# Patient Record
Sex: Male | Born: 1935 | Race: White | Hispanic: No | Marital: Married | Smoking: Never smoker
Health system: Southern US, Community
[De-identification: ages and names within clinical notes are randomized; demographics above are authoritative.]

## PROBLEM LIST (undated history)

## (undated) DIAGNOSIS — Z87442 Personal history of urinary calculi: Secondary | ICD-10-CM

## (undated) DIAGNOSIS — N179 Acute kidney failure, unspecified: Secondary | ICD-10-CM

## (undated) DIAGNOSIS — K219 Gastro-esophageal reflux disease without esophagitis: Secondary | ICD-10-CM

## (undated) DIAGNOSIS — F411 Generalized anxiety disorder: Secondary | ICD-10-CM

## (undated) DIAGNOSIS — N390 Urinary tract infection, site not specified: Secondary | ICD-10-CM

## (undated) DIAGNOSIS — N200 Calculus of kidney: Secondary | ICD-10-CM

## (undated) DIAGNOSIS — E119 Type 2 diabetes mellitus without complications: Secondary | ICD-10-CM

## (undated) DIAGNOSIS — I2510 Atherosclerotic heart disease of native coronary artery without angina pectoris: Secondary | ICD-10-CM

## (undated) DIAGNOSIS — I10 Essential (primary) hypertension: Secondary | ICD-10-CM

## (undated) DIAGNOSIS — R0902 Hypoxemia: Secondary | ICD-10-CM

## (undated) DIAGNOSIS — E785 Hyperlipidemia, unspecified: Secondary | ICD-10-CM

## (undated) DIAGNOSIS — R9389 Abnormal findings on diagnostic imaging of other specified body structures: Secondary | ICD-10-CM

## (undated) DIAGNOSIS — J189 Pneumonia, unspecified organism: Secondary | ICD-10-CM

## (undated) DIAGNOSIS — M3320 Polymyositis, organ involvement unspecified: Secondary | ICD-10-CM

## (undated) DIAGNOSIS — C4490 Unspecified malignant neoplasm of skin, unspecified: Secondary | ICD-10-CM

## (undated) DIAGNOSIS — R509 Fever, unspecified: Secondary | ICD-10-CM

## (undated) DIAGNOSIS — D649 Anemia, unspecified: Secondary | ICD-10-CM

## (undated) DIAGNOSIS — K5190 Ulcerative colitis, unspecified, without complications: Secondary | ICD-10-CM

## (undated) DIAGNOSIS — R7401 Elevation of levels of liver transaminase levels: Secondary | ICD-10-CM

## (undated) DIAGNOSIS — F5101 Primary insomnia: Secondary | ICD-10-CM

## (undated) DIAGNOSIS — J438 Other emphysema: Secondary | ICD-10-CM

## (undated) DIAGNOSIS — M1990 Unspecified osteoarthritis, unspecified site: Secondary | ICD-10-CM

## (undated) DIAGNOSIS — R001 Bradycardia, unspecified: Secondary | ICD-10-CM

## (undated) DIAGNOSIS — R600 Localized edema: Secondary | ICD-10-CM

## (undated) DIAGNOSIS — R059 Cough, unspecified: Secondary | ICD-10-CM

## (undated) HISTORY — DX: Atherosclerotic heart disease of native coronary artery without angina pectoris: I25.10

## (undated) HISTORY — DX: Anemia, unspecified: D64.9

## (undated) HISTORY — DX: Urinary tract infection, site not specified: N39.0

## (undated) HISTORY — PX: BACK SURGERY: SHX140

## (undated) HISTORY — DX: Generalized anxiety disorder: F41.1

## (undated) HISTORY — DX: Calculus of kidney: N20.0

## (undated) HISTORY — DX: Hyperlipidemia, unspecified: E78.5

## (undated) HISTORY — PX: CATARACT EXTRACTION: SUR2

## (undated) HISTORY — DX: Cough, unspecified: R05.9

## (undated) HISTORY — DX: Type 2 diabetes mellitus without complications: E11.9

## (undated) HISTORY — DX: Fever, unspecified: R50.9

## (undated) HISTORY — DX: Ulcerative colitis, unspecified, without complications: K51.90

## (undated) HISTORY — DX: Pneumonia, unspecified organism: J18.9

## (undated) HISTORY — PX: TRIGGER FINGER RELEASE: SHX641

## (undated) HISTORY — DX: Abnormal findings on diagnostic imaging of other specified body structures: R93.89

## (undated) HISTORY — DX: Elevation of levels of liver transaminase levels: R74.01

## (undated) HISTORY — DX: Polymyositis, organ involvement unspecified: M33.20

## (undated) HISTORY — DX: Unspecified osteoarthritis, unspecified site: M19.90

## (undated) HISTORY — DX: Essential (primary) hypertension: I10

## (undated) HISTORY — DX: Unspecified malignant neoplasm of skin, unspecified: C44.90

## (undated) HISTORY — PX: ANGIOPLASTY: SHX39

## (undated) HISTORY — DX: Primary insomnia: F51.01

## (undated) HISTORY — DX: Acute kidney failure, unspecified: N17.9

## (undated) HISTORY — DX: Hypoxemia: R09.02

## (undated) HISTORY — DX: Bradycardia, unspecified: R00.1

## (undated) HISTORY — DX: Localized edema: R60.0

## (undated) HISTORY — DX: Other emphysema: J43.8

## (undated) HISTORY — DX: Gastro-esophageal reflux disease without esophagitis: K21.9

---

## 2002-07-25 ENCOUNTER — Encounter: Admission: RE | Admit: 2002-07-25 | Discharge: 2002-07-25 | Payer: Self-pay | Admitting: Unknown Physician Specialty

## 2002-07-25 ENCOUNTER — Encounter: Payer: Self-pay | Admitting: Unknown Physician Specialty

## 2003-04-26 HISTORY — PX: ESOPHAGOGASTRODUODENOSCOPY: SHX1529

## 2003-05-02 ENCOUNTER — Encounter: Admission: RE | Admit: 2003-05-02 | Discharge: 2003-05-02 | Payer: Self-pay | Admitting: Neurosurgery

## 2003-05-02 ENCOUNTER — Encounter: Payer: Self-pay | Admitting: Neurosurgery

## 2003-05-02 ENCOUNTER — Encounter: Payer: Self-pay | Admitting: Radiology

## 2003-05-16 ENCOUNTER — Encounter: Admission: RE | Admit: 2003-05-16 | Discharge: 2003-05-16 | Payer: Self-pay | Admitting: Neurosurgery

## 2003-05-16 ENCOUNTER — Encounter: Payer: Self-pay | Admitting: Neurosurgery

## 2003-07-07 ENCOUNTER — Ambulatory Visit (HOSPITAL_COMMUNITY): Admission: RE | Admit: 2003-07-07 | Discharge: 2003-07-08 | Payer: Self-pay | Admitting: Neurosurgery

## 2005-06-16 HISTORY — PX: COLONOSCOPY: SHX174

## 2008-08-25 HISTORY — PX: ANGIOPLASTY: SHX39

## 2014-11-06 DIAGNOSIS — E784 Other hyperlipidemia: Secondary | ICD-10-CM | POA: Diagnosis not present

## 2014-11-06 DIAGNOSIS — I10 Essential (primary) hypertension: Secondary | ICD-10-CM | POA: Diagnosis not present

## 2014-11-08 DIAGNOSIS — I2510 Atherosclerotic heart disease of native coronary artery without angina pectoris: Secondary | ICD-10-CM | POA: Diagnosis not present

## 2014-11-09 DIAGNOSIS — H524 Presbyopia: Secondary | ICD-10-CM | POA: Diagnosis not present

## 2014-11-09 DIAGNOSIS — H5210 Myopia, unspecified eye: Secondary | ICD-10-CM | POA: Diagnosis not present

## 2015-03-05 DIAGNOSIS — M65351 Trigger finger, right little finger: Secondary | ICD-10-CM | POA: Diagnosis not present

## 2015-03-26 DIAGNOSIS — I2510 Atherosclerotic heart disease of native coronary artery without angina pectoris: Secondary | ICD-10-CM | POA: Diagnosis not present

## 2015-03-26 DIAGNOSIS — R7301 Impaired fasting glucose: Secondary | ICD-10-CM | POA: Diagnosis not present

## 2015-03-26 DIAGNOSIS — L299 Pruritus, unspecified: Secondary | ICD-10-CM | POA: Diagnosis not present

## 2015-03-26 DIAGNOSIS — F411 Generalized anxiety disorder: Secondary | ICD-10-CM | POA: Diagnosis not present

## 2015-03-26 DIAGNOSIS — E782 Mixed hyperlipidemia: Secondary | ICD-10-CM | POA: Diagnosis not present

## 2015-03-26 DIAGNOSIS — E780 Pure hypercholesterolemia: Secondary | ICD-10-CM | POA: Diagnosis not present

## 2015-03-26 DIAGNOSIS — K219 Gastro-esophageal reflux disease without esophagitis: Secondary | ICD-10-CM | POA: Diagnosis not present

## 2015-04-27 DIAGNOSIS — F5101 Primary insomnia: Secondary | ICD-10-CM | POA: Diagnosis not present

## 2015-04-27 DIAGNOSIS — F411 Generalized anxiety disorder: Secondary | ICD-10-CM | POA: Diagnosis not present

## 2015-04-27 DIAGNOSIS — E119 Type 2 diabetes mellitus without complications: Secondary | ICD-10-CM | POA: Diagnosis not present

## 2015-06-27 DIAGNOSIS — E119 Type 2 diabetes mellitus without complications: Secondary | ICD-10-CM | POA: Diagnosis not present

## 2015-06-27 DIAGNOSIS — E7800 Pure hypercholesterolemia, unspecified: Secondary | ICD-10-CM | POA: Diagnosis not present

## 2015-06-29 DIAGNOSIS — F5101 Primary insomnia: Secondary | ICD-10-CM | POA: Diagnosis not present

## 2015-06-29 DIAGNOSIS — Z1211 Encounter for screening for malignant neoplasm of colon: Secondary | ICD-10-CM | POA: Diagnosis not present

## 2015-06-29 DIAGNOSIS — F341 Dysthymic disorder: Secondary | ICD-10-CM | POA: Diagnosis not present

## 2015-06-29 DIAGNOSIS — Z6829 Body mass index (BMI) 29.0-29.9, adult: Secondary | ICD-10-CM | POA: Diagnosis not present

## 2015-06-29 DIAGNOSIS — Z0000 Encounter for general adult medical examination without abnormal findings: Secondary | ICD-10-CM | POA: Diagnosis not present

## 2015-06-29 DIAGNOSIS — M65351 Trigger finger, right little finger: Secondary | ICD-10-CM | POA: Diagnosis not present

## 2015-06-29 DIAGNOSIS — E119 Type 2 diabetes mellitus without complications: Secondary | ICD-10-CM | POA: Diagnosis not present

## 2015-07-16 DIAGNOSIS — E119 Type 2 diabetes mellitus without complications: Secondary | ICD-10-CM | POA: Diagnosis not present

## 2015-07-26 DIAGNOSIS — E119 Type 2 diabetes mellitus without complications: Secondary | ICD-10-CM | POA: Diagnosis not present

## 2015-07-26 DIAGNOSIS — F5101 Primary insomnia: Secondary | ICD-10-CM | POA: Diagnosis not present

## 2015-07-26 DIAGNOSIS — F320 Major depressive disorder, single episode, mild: Secondary | ICD-10-CM | POA: Diagnosis not present

## 2015-08-01 DIAGNOSIS — M65351 Trigger finger, right little finger: Secondary | ICD-10-CM | POA: Diagnosis not present

## 2015-08-03 DIAGNOSIS — E119 Type 2 diabetes mellitus without complications: Secondary | ICD-10-CM | POA: Diagnosis not present

## 2015-08-07 DIAGNOSIS — R008 Other abnormalities of heart beat: Secondary | ICD-10-CM | POA: Diagnosis not present

## 2015-08-07 DIAGNOSIS — Z01818 Encounter for other preprocedural examination: Secondary | ICD-10-CM | POA: Diagnosis not present

## 2015-08-07 DIAGNOSIS — Z01810 Encounter for preprocedural cardiovascular examination: Secondary | ICD-10-CM | POA: Diagnosis not present

## 2015-08-07 DIAGNOSIS — R238 Other skin changes: Secondary | ICD-10-CM | POA: Diagnosis not present

## 2015-08-07 DIAGNOSIS — M65351 Trigger finger, right little finger: Secondary | ICD-10-CM | POA: Diagnosis not present

## 2015-08-30 DIAGNOSIS — E119 Type 2 diabetes mellitus without complications: Secondary | ICD-10-CM | POA: Diagnosis not present

## 2015-09-04 DIAGNOSIS — M65351 Trigger finger, right little finger: Secondary | ICD-10-CM | POA: Diagnosis not present

## 2015-09-06 DIAGNOSIS — E7800 Pure hypercholesterolemia, unspecified: Secondary | ICD-10-CM | POA: Diagnosis not present

## 2015-09-06 DIAGNOSIS — I2510 Atherosclerotic heart disease of native coronary artery without angina pectoris: Secondary | ICD-10-CM | POA: Diagnosis not present

## 2015-09-06 DIAGNOSIS — M1990 Unspecified osteoarthritis, unspecified site: Secondary | ICD-10-CM | POA: Diagnosis not present

## 2015-09-06 DIAGNOSIS — F329 Major depressive disorder, single episode, unspecified: Secondary | ICD-10-CM | POA: Diagnosis not present

## 2015-09-06 DIAGNOSIS — M65351 Trigger finger, right little finger: Secondary | ICD-10-CM | POA: Diagnosis not present

## 2015-09-06 DIAGNOSIS — I10 Essential (primary) hypertension: Secondary | ICD-10-CM | POA: Diagnosis not present

## 2015-09-06 DIAGNOSIS — K219 Gastro-esophageal reflux disease without esophagitis: Secondary | ICD-10-CM | POA: Diagnosis not present

## 2015-09-06 DIAGNOSIS — M109 Gout, unspecified: Secondary | ICD-10-CM | POA: Diagnosis not present

## 2015-09-06 DIAGNOSIS — F419 Anxiety disorder, unspecified: Secondary | ICD-10-CM | POA: Diagnosis not present

## 2015-09-10 DIAGNOSIS — M25641 Stiffness of right hand, not elsewhere classified: Secondary | ICD-10-CM | POA: Diagnosis not present

## 2015-09-10 DIAGNOSIS — M25541 Pain in joints of right hand: Secondary | ICD-10-CM | POA: Diagnosis not present

## 2015-10-19 DIAGNOSIS — F5101 Primary insomnia: Secondary | ICD-10-CM | POA: Diagnosis not present

## 2015-10-19 DIAGNOSIS — M79641 Pain in right hand: Secondary | ICD-10-CM | POA: Diagnosis not present

## 2015-10-19 DIAGNOSIS — I2510 Atherosclerotic heart disease of native coronary artery without angina pectoris: Secondary | ICD-10-CM | POA: Diagnosis not present

## 2015-10-19 DIAGNOSIS — N183 Chronic kidney disease, stage 3 (moderate): Secondary | ICD-10-CM | POA: Diagnosis not present

## 2015-10-19 DIAGNOSIS — E782 Mixed hyperlipidemia: Secondary | ICD-10-CM | POA: Diagnosis not present

## 2015-10-19 DIAGNOSIS — I10 Essential (primary) hypertension: Secondary | ICD-10-CM | POA: Diagnosis not present

## 2015-10-19 DIAGNOSIS — I119 Hypertensive heart disease without heart failure: Secondary | ICD-10-CM | POA: Diagnosis not present

## 2015-10-19 DIAGNOSIS — E119 Type 2 diabetes mellitus without complications: Secondary | ICD-10-CM | POA: Diagnosis not present

## 2015-10-30 DIAGNOSIS — M65351 Trigger finger, right little finger: Secondary | ICD-10-CM | POA: Diagnosis not present

## 2015-10-30 DIAGNOSIS — Z6829 Body mass index (BMI) 29.0-29.9, adult: Secondary | ICD-10-CM | POA: Diagnosis not present

## 2015-10-30 DIAGNOSIS — E119 Type 2 diabetes mellitus without complications: Secondary | ICD-10-CM | POA: Diagnosis not present

## 2015-10-30 DIAGNOSIS — Z1211 Encounter for screening for malignant neoplasm of colon: Secondary | ICD-10-CM | POA: Diagnosis not present

## 2015-10-30 DIAGNOSIS — F341 Dysthymic disorder: Secondary | ICD-10-CM | POA: Diagnosis not present

## 2015-10-30 DIAGNOSIS — F5101 Primary insomnia: Secondary | ICD-10-CM | POA: Diagnosis not present

## 2015-10-30 DIAGNOSIS — Z0000 Encounter for general adult medical examination without abnormal findings: Secondary | ICD-10-CM | POA: Diagnosis not present

## 2015-12-28 DIAGNOSIS — I2510 Atherosclerotic heart disease of native coronary artery without angina pectoris: Secondary | ICD-10-CM | POA: Diagnosis not present

## 2015-12-28 DIAGNOSIS — I10 Essential (primary) hypertension: Secondary | ICD-10-CM | POA: Diagnosis not present

## 2015-12-28 DIAGNOSIS — E088 Diabetes mellitus due to underlying condition with unspecified complications: Secondary | ICD-10-CM | POA: Diagnosis not present

## 2015-12-28 DIAGNOSIS — E785 Hyperlipidemia, unspecified: Secondary | ICD-10-CM | POA: Diagnosis not present

## 2016-01-18 DIAGNOSIS — I10 Essential (primary) hypertension: Secondary | ICD-10-CM | POA: Diagnosis not present

## 2016-01-18 DIAGNOSIS — I119 Hypertensive heart disease without heart failure: Secondary | ICD-10-CM | POA: Diagnosis not present

## 2016-01-18 DIAGNOSIS — C44329 Squamous cell carcinoma of skin of other parts of face: Secondary | ICD-10-CM | POA: Diagnosis not present

## 2016-01-18 DIAGNOSIS — M79641 Pain in right hand: Secondary | ICD-10-CM | POA: Diagnosis not present

## 2016-01-18 DIAGNOSIS — F5101 Primary insomnia: Secondary | ICD-10-CM | POA: Diagnosis not present

## 2016-01-18 DIAGNOSIS — N183 Chronic kidney disease, stage 3 (moderate): Secondary | ICD-10-CM | POA: Diagnosis not present

## 2016-01-18 DIAGNOSIS — E782 Mixed hyperlipidemia: Secondary | ICD-10-CM | POA: Diagnosis not present

## 2016-01-18 DIAGNOSIS — E119 Type 2 diabetes mellitus without complications: Secondary | ICD-10-CM | POA: Diagnosis not present

## 2016-01-18 DIAGNOSIS — I2510 Atherosclerotic heart disease of native coronary artery without angina pectoris: Secondary | ICD-10-CM | POA: Diagnosis not present

## 2016-02-01 DIAGNOSIS — N410 Acute prostatitis: Secondary | ICD-10-CM | POA: Diagnosis not present

## 2016-02-01 DIAGNOSIS — N3001 Acute cystitis with hematuria: Secondary | ICD-10-CM | POA: Diagnosis not present

## 2016-02-05 DIAGNOSIS — H52223 Regular astigmatism, bilateral: Secondary | ICD-10-CM | POA: Diagnosis not present

## 2016-02-05 DIAGNOSIS — H5210 Myopia, unspecified eye: Secondary | ICD-10-CM | POA: Diagnosis not present

## 2016-03-10 DIAGNOSIS — Z0000 Encounter for general adult medical examination without abnormal findings: Secondary | ICD-10-CM | POA: Diagnosis not present

## 2016-03-10 DIAGNOSIS — E663 Overweight: Secondary | ICD-10-CM | POA: Diagnosis not present

## 2016-03-10 DIAGNOSIS — Z6828 Body mass index (BMI) 28.0-28.9, adult: Secondary | ICD-10-CM | POA: Diagnosis not present

## 2016-03-14 DIAGNOSIS — Z1211 Encounter for screening for malignant neoplasm of colon: Secondary | ICD-10-CM | POA: Diagnosis not present

## 2016-06-03 DIAGNOSIS — I119 Hypertensive heart disease without heart failure: Secondary | ICD-10-CM | POA: Diagnosis not present

## 2016-06-03 DIAGNOSIS — L570 Actinic keratosis: Secondary | ICD-10-CM | POA: Diagnosis not present

## 2016-06-03 DIAGNOSIS — F5101 Primary insomnia: Secondary | ICD-10-CM | POA: Diagnosis not present

## 2016-06-30 DIAGNOSIS — I2510 Atherosclerotic heart disease of native coronary artery without angina pectoris: Secondary | ICD-10-CM | POA: Diagnosis not present

## 2016-06-30 DIAGNOSIS — E088 Diabetes mellitus due to underlying condition with unspecified complications: Secondary | ICD-10-CM | POA: Diagnosis not present

## 2016-06-30 DIAGNOSIS — I10 Essential (primary) hypertension: Secondary | ICD-10-CM | POA: Diagnosis not present

## 2016-06-30 DIAGNOSIS — E785 Hyperlipidemia, unspecified: Secondary | ICD-10-CM | POA: Diagnosis not present

## 2016-07-02 DIAGNOSIS — M545 Low back pain: Secondary | ICD-10-CM | POA: Diagnosis not present

## 2016-07-02 DIAGNOSIS — I119 Hypertensive heart disease without heart failure: Secondary | ICD-10-CM | POA: Diagnosis not present

## 2016-07-02 DIAGNOSIS — D485 Neoplasm of uncertain behavior of skin: Secondary | ICD-10-CM | POA: Diagnosis not present

## 2016-07-21 DIAGNOSIS — Z125 Encounter for screening for malignant neoplasm of prostate: Secondary | ICD-10-CM | POA: Diagnosis not present

## 2016-07-21 DIAGNOSIS — I2510 Atherosclerotic heart disease of native coronary artery without angina pectoris: Secondary | ICD-10-CM | POA: Diagnosis not present

## 2016-07-21 DIAGNOSIS — I119 Hypertensive heart disease without heart failure: Secondary | ICD-10-CM | POA: Diagnosis not present

## 2016-07-21 DIAGNOSIS — E119 Type 2 diabetes mellitus without complications: Secondary | ICD-10-CM | POA: Diagnosis not present

## 2016-07-21 DIAGNOSIS — N183 Chronic kidney disease, stage 3 (moderate): Secondary | ICD-10-CM | POA: Diagnosis not present

## 2016-07-21 DIAGNOSIS — E782 Mixed hyperlipidemia: Secondary | ICD-10-CM | POA: Diagnosis not present

## 2016-07-21 DIAGNOSIS — F5101 Primary insomnia: Secondary | ICD-10-CM | POA: Diagnosis not present

## 2016-12-23 DIAGNOSIS — I2510 Atherosclerotic heart disease of native coronary artery without angina pectoris: Secondary | ICD-10-CM | POA: Diagnosis not present

## 2016-12-23 DIAGNOSIS — E088 Diabetes mellitus due to underlying condition with unspecified complications: Secondary | ICD-10-CM | POA: Diagnosis not present

## 2016-12-23 DIAGNOSIS — E785 Hyperlipidemia, unspecified: Secondary | ICD-10-CM | POA: Diagnosis not present

## 2016-12-23 DIAGNOSIS — I10 Essential (primary) hypertension: Secondary | ICD-10-CM | POA: Diagnosis not present

## 2017-02-06 DIAGNOSIS — E119 Type 2 diabetes mellitus without complications: Secondary | ICD-10-CM | POA: Diagnosis not present

## 2017-02-06 DIAGNOSIS — D6489 Other specified anemias: Secondary | ICD-10-CM | POA: Diagnosis not present

## 2017-02-06 DIAGNOSIS — M65331 Trigger finger, right middle finger: Secondary | ICD-10-CM | POA: Diagnosis not present

## 2017-02-06 DIAGNOSIS — I119 Hypertensive heart disease without heart failure: Secondary | ICD-10-CM | POA: Diagnosis not present

## 2017-02-06 DIAGNOSIS — R05 Cough: Secondary | ICD-10-CM | POA: Diagnosis not present

## 2017-02-06 DIAGNOSIS — R918 Other nonspecific abnormal finding of lung field: Secondary | ICD-10-CM | POA: Diagnosis not present

## 2017-02-06 DIAGNOSIS — D509 Iron deficiency anemia, unspecified: Secondary | ICD-10-CM | POA: Diagnosis not present

## 2017-02-06 DIAGNOSIS — I10 Essential (primary) hypertension: Secondary | ICD-10-CM | POA: Diagnosis not present

## 2017-02-06 DIAGNOSIS — E782 Mixed hyperlipidemia: Secondary | ICD-10-CM | POA: Diagnosis not present

## 2017-02-06 DIAGNOSIS — Z6828 Body mass index (BMI) 28.0-28.9, adult: Secondary | ICD-10-CM | POA: Diagnosis not present

## 2017-02-23 DIAGNOSIS — Z1211 Encounter for screening for malignant neoplasm of colon: Secondary | ICD-10-CM | POA: Diagnosis not present

## 2017-02-24 DIAGNOSIS — H524 Presbyopia: Secondary | ICD-10-CM | POA: Diagnosis not present

## 2017-03-30 DIAGNOSIS — H353131 Nonexudative age-related macular degeneration, bilateral, early dry stage: Secondary | ICD-10-CM | POA: Diagnosis not present

## 2017-03-30 DIAGNOSIS — H25811 Combined forms of age-related cataract, right eye: Secondary | ICD-10-CM | POA: Diagnosis not present

## 2017-03-30 DIAGNOSIS — E119 Type 2 diabetes mellitus without complications: Secondary | ICD-10-CM | POA: Diagnosis not present

## 2017-03-30 DIAGNOSIS — H2511 Age-related nuclear cataract, right eye: Secondary | ICD-10-CM | POA: Diagnosis not present

## 2017-03-31 DIAGNOSIS — H25013 Cortical age-related cataract, bilateral: Secondary | ICD-10-CM | POA: Diagnosis not present

## 2017-04-06 DIAGNOSIS — H25812 Combined forms of age-related cataract, left eye: Secondary | ICD-10-CM | POA: Diagnosis not present

## 2017-04-06 DIAGNOSIS — H2512 Age-related nuclear cataract, left eye: Secondary | ICD-10-CM | POA: Diagnosis not present

## 2017-04-06 DIAGNOSIS — H25013 Cortical age-related cataract, bilateral: Secondary | ICD-10-CM | POA: Diagnosis not present

## 2017-05-12 DIAGNOSIS — H52223 Regular astigmatism, bilateral: Secondary | ICD-10-CM | POA: Diagnosis not present

## 2017-05-12 DIAGNOSIS — H524 Presbyopia: Secondary | ICD-10-CM | POA: Diagnosis not present

## 2017-05-12 DIAGNOSIS — Z961 Presence of intraocular lens: Secondary | ICD-10-CM | POA: Diagnosis not present

## 2017-05-12 DIAGNOSIS — H25013 Cortical age-related cataract, bilateral: Secondary | ICD-10-CM | POA: Diagnosis not present

## 2017-06-26 DIAGNOSIS — J438 Other emphysema: Secondary | ICD-10-CM | POA: Diagnosis not present

## 2017-06-26 DIAGNOSIS — I119 Hypertensive heart disease without heart failure: Secondary | ICD-10-CM | POA: Diagnosis not present

## 2017-06-26 DIAGNOSIS — E8801 Alpha-1-antitrypsin deficiency: Secondary | ICD-10-CM | POA: Diagnosis not present

## 2017-06-26 DIAGNOSIS — E119 Type 2 diabetes mellitus without complications: Secondary | ICD-10-CM | POA: Diagnosis not present

## 2017-06-26 DIAGNOSIS — D508 Other iron deficiency anemias: Secondary | ICD-10-CM | POA: Diagnosis not present

## 2017-06-26 DIAGNOSIS — E782 Mixed hyperlipidemia: Secondary | ICD-10-CM | POA: Diagnosis not present

## 2017-06-26 DIAGNOSIS — Z6829 Body mass index (BMI) 29.0-29.9, adult: Secondary | ICD-10-CM | POA: Diagnosis not present

## 2017-07-09 DIAGNOSIS — J438 Other emphysema: Secondary | ICD-10-CM | POA: Diagnosis not present

## 2017-07-09 DIAGNOSIS — E8801 Alpha-1-antitrypsin deficiency: Secondary | ICD-10-CM | POA: Diagnosis not present

## 2017-07-20 DIAGNOSIS — Z0000 Encounter for general adult medical examination without abnormal findings: Secondary | ICD-10-CM | POA: Diagnosis not present

## 2017-07-20 DIAGNOSIS — E8801 Alpha-1-antitrypsin deficiency: Secondary | ICD-10-CM | POA: Diagnosis not present

## 2017-07-20 DIAGNOSIS — Z6829 Body mass index (BMI) 29.0-29.9, adult: Secondary | ICD-10-CM | POA: Diagnosis not present

## 2018-04-13 ENCOUNTER — Encounter: Payer: Self-pay | Admitting: Cardiology

## 2018-04-13 DIAGNOSIS — Z6828 Body mass index (BMI) 28.0-28.9, adult: Secondary | ICD-10-CM | POA: Diagnosis not present

## 2018-04-13 DIAGNOSIS — I119 Hypertensive heart disease without heart failure: Secondary | ICD-10-CM | POA: Diagnosis not present

## 2018-04-13 DIAGNOSIS — E782 Mixed hyperlipidemia: Secondary | ICD-10-CM | POA: Diagnosis not present

## 2018-04-13 DIAGNOSIS — D508 Other iron deficiency anemias: Secondary | ICD-10-CM | POA: Diagnosis not present

## 2018-04-13 DIAGNOSIS — E119 Type 2 diabetes mellitus without complications: Secondary | ICD-10-CM | POA: Diagnosis not present

## 2018-04-13 DIAGNOSIS — E1121 Type 2 diabetes mellitus with diabetic nephropathy: Secondary | ICD-10-CM | POA: Diagnosis not present

## 2018-04-13 DIAGNOSIS — J438 Other emphysema: Secondary | ICD-10-CM | POA: Diagnosis not present

## 2018-04-13 DIAGNOSIS — Z85828 Personal history of other malignant neoplasm of skin: Secondary | ICD-10-CM | POA: Diagnosis not present

## 2018-04-13 DIAGNOSIS — E8801 Alpha-1-antitrypsin deficiency: Secondary | ICD-10-CM | POA: Diagnosis not present

## 2018-04-20 DIAGNOSIS — L728 Other follicular cysts of the skin and subcutaneous tissue: Secondary | ICD-10-CM | POA: Diagnosis not present

## 2018-04-20 DIAGNOSIS — L578 Other skin changes due to chronic exposure to nonionizing radiation: Secondary | ICD-10-CM | POA: Diagnosis not present

## 2018-04-20 DIAGNOSIS — L570 Actinic keratosis: Secondary | ICD-10-CM | POA: Diagnosis not present

## 2018-05-03 ENCOUNTER — Ambulatory Visit: Payer: Medicare HMO | Admitting: Cardiology

## 2018-05-03 ENCOUNTER — Encounter: Payer: Self-pay | Admitting: Cardiology

## 2018-05-03 DIAGNOSIS — T8182XA Emphysema (subcutaneous) resulting from a procedure, initial encounter: Secondary | ICD-10-CM | POA: Insufficient documentation

## 2018-05-03 DIAGNOSIS — N1831 Chronic kidney disease, stage 3a: Secondary | ICD-10-CM | POA: Diagnosis present

## 2018-05-03 DIAGNOSIS — E119 Type 2 diabetes mellitus without complications: Secondary | ICD-10-CM | POA: Insufficient documentation

## 2018-05-03 DIAGNOSIS — N189 Chronic kidney disease, unspecified: Secondary | ICD-10-CM | POA: Insufficient documentation

## 2018-05-03 DIAGNOSIS — E782 Mixed hyperlipidemia: Secondary | ICD-10-CM

## 2018-05-03 DIAGNOSIS — I2510 Atherosclerotic heart disease of native coronary artery without angina pectoris: Secondary | ICD-10-CM | POA: Insufficient documentation

## 2018-05-03 DIAGNOSIS — I119 Hypertensive heart disease without heart failure: Secondary | ICD-10-CM

## 2018-05-03 DIAGNOSIS — R001 Bradycardia, unspecified: Secondary | ICD-10-CM | POA: Insufficient documentation

## 2018-05-03 DIAGNOSIS — M6530 Trigger finger, unspecified finger: Secondary | ICD-10-CM | POA: Insufficient documentation

## 2018-05-03 DIAGNOSIS — E1169 Type 2 diabetes mellitus with other specified complication: Secondary | ICD-10-CM | POA: Insufficient documentation

## 2018-05-03 DIAGNOSIS — G4700 Insomnia, unspecified: Secondary | ICD-10-CM | POA: Insufficient documentation

## 2018-05-03 DIAGNOSIS — K219 Gastro-esophageal reflux disease without esophagitis: Secondary | ICD-10-CM

## 2018-05-03 DIAGNOSIS — E785 Hyperlipidemia, unspecified: Secondary | ICD-10-CM | POA: Insufficient documentation

## 2018-05-03 DIAGNOSIS — I10 Essential (primary) hypertension: Secondary | ICD-10-CM | POA: Insufficient documentation

## 2018-05-03 HISTORY — DX: Hypertensive heart disease without heart failure: I11.9

## 2018-05-03 HISTORY — DX: Chronic kidney disease, unspecified: N18.9

## 2018-05-03 HISTORY — DX: Atherosclerotic heart disease of native coronary artery without angina pectoris: I25.10

## 2018-05-03 HISTORY — DX: Mixed hyperlipidemia: E78.2

## 2018-05-03 HISTORY — DX: Emphysema (subcutaneous) resulting from a procedure, initial encounter: T81.82XA

## 2018-05-03 HISTORY — DX: Hyperlipidemia, unspecified: E78.5

## 2018-05-03 HISTORY — DX: Gastro-esophageal reflux disease without esophagitis: K21.9

## 2018-05-03 HISTORY — DX: Essential (primary) hypertension: I10

## 2018-05-03 HISTORY — DX: Trigger finger, unspecified finger: M65.30

## 2018-05-03 HISTORY — DX: Chronic kidney disease, stage 3a: N18.31

## 2018-05-03 HISTORY — DX: Insomnia, unspecified: G47.00

## 2018-05-03 HISTORY — DX: Type 2 diabetes mellitus with other specified complication: E11.69

## 2018-05-11 ENCOUNTER — Encounter: Payer: Self-pay | Admitting: Cardiology

## 2018-05-11 ENCOUNTER — Ambulatory Visit: Payer: Medicare HMO | Admitting: Cardiology

## 2018-05-11 VITALS — BP 116/70 | Ht 68.0 in | Wt 185.0 lb

## 2018-05-11 DIAGNOSIS — K219 Gastro-esophageal reflux disease without esophagitis: Secondary | ICD-10-CM | POA: Diagnosis not present

## 2018-05-11 DIAGNOSIS — R001 Bradycardia, unspecified: Secondary | ICD-10-CM | POA: Diagnosis not present

## 2018-05-11 DIAGNOSIS — T8182XA Emphysema (subcutaneous) resulting from a procedure, initial encounter: Secondary | ICD-10-CM

## 2018-05-11 DIAGNOSIS — I2510 Atherosclerotic heart disease of native coronary artery without angina pectoris: Secondary | ICD-10-CM

## 2018-05-11 DIAGNOSIS — N189 Chronic kidney disease, unspecified: Secondary | ICD-10-CM | POA: Diagnosis not present

## 2018-05-11 DIAGNOSIS — E782 Mixed hyperlipidemia: Secondary | ICD-10-CM

## 2018-05-11 DIAGNOSIS — G4700 Insomnia, unspecified: Secondary | ICD-10-CM | POA: Diagnosis not present

## 2018-05-11 DIAGNOSIS — I10 Essential (primary) hypertension: Secondary | ICD-10-CM

## 2018-05-11 DIAGNOSIS — E119 Type 2 diabetes mellitus without complications: Secondary | ICD-10-CM

## 2018-05-11 DIAGNOSIS — M65351 Trigger finger, right little finger: Secondary | ICD-10-CM | POA: Diagnosis not present

## 2018-05-11 NOTE — Patient Instructions (Signed)
Medication Instructions:  Your physician recommends that you continue on your current medications as directed. Please refer to the Current Medication list given to you today.  Labwork: none  Testing/Procedures: none  Follow-Up: Your physician recommends that you schedule a follow-up appointment in: 6 months  Any Other Special Instructions Will Be Listed Below (If Applicable).     If you need a refill on your cardiac medications before your next appointment, please call your pharmacy.   [place], RN, BSN

## 2018-05-11 NOTE — Addendum Note (Signed)
Addended by: Tarri Glenn on: 05/11/2018 01:22 PM   Modules accepted: Orders

## 2018-05-11 NOTE — Progress Notes (Signed)
Cardiology Office Note:    Date:  05/11/2018   ID:  Kerry Matthews, DOB 02-17-1936, MRN [id number]  PCP:  [name], MD  Cardiologist:  Jenean Lindau, MD   Referring MD: [name], MD    ASSESSMENT:    1. Atherosclerosis of native coronary artery without angina pectoris, unspecified whether native or transplanted heart   2. Type 2 diabetes mellitus without complication, unspecified whether long term insulin use ([place])   3. Essential hypertension   4. Mixed hyperlipidemia   5. Gastroesophageal reflux disease without esophagitis   6. Trigger little finger of right hand   7. Bradycardia   8. Chronic kidney disease, unspecified CKD stage   9. Insomnia, unspecified type   10. Subcutaneous emphysema resulting from a procedure, initial encounter    PLAN:    In order of problems listed above:  1. Secondary prevention stressed with patient.  Importance of compliance with diet and medication stressed and he vocalized understanding.  His blood pressure is stable.  Diet was discussed with dyslipidemia and diabetes mellitus and he vocalized understanding.  I reviewed lab work sent by primary care physician and it looks fine.  He has renal insufficiency and this is followed by his primary care physician.  He does have sublingual nitroglycerin and he can use it as needed and knows how to do so. 2. 6 months follow-up or earlier if he has any concerns.   Medication Adjustments/Labs and Tests Ordered: Current medicines are reviewed at length with the patient today.  Concerns regarding medicines are outlined above.  No orders of the defined types were placed in this encounter.  No orders of the defined types were placed in this encounter.    History of Present Illness:    Kerry Matthews is a 82 y.o. male who is being seen today for the evaluation of coronary artery disease and to get established  at the request of Cox, Kirsten, MD.  This patient has been under my care in my previous  practice.  He is here now to transfer his care and be established with my current practice.  Patient has known coronary artery disease and has undergone coronary stenting.  He denies any problems at this time and takes care of activities of daily living.  No chest pain orthopnea or PND.  Is a very active gentleman but does not exercise on a regular basis.  At the time of my evaluation, the patient is alert awake oriented and in no distress.  His wife accompanies him for this visit.  Past Medical History:  Diagnosis Date  . Coronary artery disease   . Diabetes ([place])   . Hyperlipidemia   . Hypertension   . Osteoarthritis   . Renal stones   . Skin cancer     Past Surgical History:  Procedure Laterality Date  . BACK SURGERY    . CATARACT EXTRACTION    . TRIGGER FINGER RELEASE      Current Medications: Current Meds  Medication Sig  . aspirin 325 MG EC tablet Take 325 mg by mouth daily.  Marland Kitchen atorvastatin (LIPITOR) 40 MG tablet Take 40 mg by mouth daily.  . Coenzyme Q10 (CO Q 10) 10 MG CAPS Take 1 capsule by mouth daily.  . metFORMIN (GLUCOPHAGE) 500 MG tablet Take 1 tablet by mouth daily with breakfast.   . Multiple Vitamins-Minerals (PRESERVISION AREDS 2+MULTI VIT) CAPS Take 1 capsule by mouth 2 (two) times daily.  . nitroGLYCERIN (NITROSTAT) 0.4 MG SL tablet Place  0.4 mg under the tongue every 5 (five) minutes as needed.  . Omega-3 Fatty Acids (FISH OIL) 1000 MG CAPS Take 2 capsules by mouth daily.  Marland Kitchen omeprazole (PRILOSEC) 20 MG capsule Take 20 mg by mouth daily.  . traZODone (DESYREL) 50 MG tablet Take 2 tablets by mouth at bedtime.     Allergies:   Ace inhibitors; Hydrocodone; Hydrocodone-acetaminophen; and Sulfamethoxazole-trimethoprim   Social History   Socioeconomic History  . Marital status: Married    Spouse name: Not on file  . Number of children: Not on file  . Years of education: Not on file  . Highest education level: Not on file  Occupational History  . Not on file    Social Needs  . Financial resource strain: Not on file  . Food insecurity:    Worry: Not on file    Inability: Not on file  . Transportation needs:    Medical: Not on file    Non-medical: Not on file  Tobacco Use  . Smoking status: Never Smoker  . Smokeless tobacco: Never Used  Substance and Sexual Activity  . Alcohol use: Not on file  . Drug use: Not on file  . Sexual activity: Not on file  Lifestyle  . Physical activity:    Days per week: Not on file    Minutes per session: Not on file  . Stress: Not on file  Relationships  . Social connections:    Talks on phone: Not on file    Gets together: Not on file    Attends religious service: Not on file    Active member of club or organization: Not on file    Attends meetings of clubs or organizations: Not on file    Relationship status: Not on file  Other Topics Concern  . Not on file  Social History Narrative  . Not on file     Family History: The patient's family history includes Clotting disorder in his brother; Heart attack in his sister; Lung disease in his sister; Pancreatic cancer in his sister; Stroke in his father; Tuberculosis in his mother.  ROS:   Please see the history of present illness.    All other systems reviewed and are negative.  EKGs/Labs/Other Studies Reviewed:    The following studies were reviewed today: I reviewed my records and discussed with the patient.   Recent Labs: No results found for requested labs within last 8760 hours.  Recent Lipid Panel No results found for: CHOL, TRIG, HDL, CHOLHDL, VLDL, LDLCALC, LDLDIRECT  Physical Exam:    VS:  BP 116/70 (BP Location: Right Arm, Patient Position: Sitting, Cuff Size: Normal)   Ht 5\' 8"  (1.727 m)   Wt 185 lb (83.9 kg)   SpO2 97%   BMI 28.13 kg/m     Wt Readings from Last 3 Encounters:  05/11/18 185 lb (83.9 kg)     GEN: Patient is in no acute distress HEENT: Normal NECK: No JVD; No carotid bruits LYMPHATICS: No  lymphadenopathy CARDIAC: S1 S2 regular, 2/6 systolic murmur at the apex. RESPIRATORY:  Clear to auscultation without rales, wheezing or rhonchi  ABDOMEN: Soft, non-tender, non-distended MUSCULOSKELETAL:  No edema; No deformity  SKIN: Warm and dry NEUROLOGIC:  Alert and oriented x 3 PSYCHIATRIC:  Normal affect    Signed, Jenean Lindau, MD  05/11/2018 10:53 AM    [place]

## 2018-12-16 ENCOUNTER — Telehealth: Payer: Self-pay | Admitting: Cardiology

## 2018-12-16 NOTE — Telephone Encounter (Signed)
Virtual Visit Pre-Appointment Phone Call  "(Name), I am calling you today to discuss your upcoming appointment. We are currently trying to limit exposure to the virus that causes COVID-19 by seeing patients at home rather than in the office."  1. "What is the BEST phone number to call the day of the visit?" - include this in appointment notes  2. Do you have or have access to (through a family member/friend) a smartphone with video capability that we can use for your visit?" a. If yes - list this number in appt notes as cell (if different from BEST phone #) and list the appointment type as a VIDEO visit in appointment notes b. If no - list the appointment type as a PHONE visit in appointment notes  3. Confirm consent - "In the setting of the current Covid19 crisis, you are scheduled for a (phone or video) visit with your provider on (date) at (time).  Just as we do with many in-office visits, in order for you to participate in this visit, we must obtain consent.  If you'd like, I can send this to your mychart (if signed up) or email for you to review.  Otherwise, I can obtain your verbal consent now.  All virtual visits are billed to your insurance company just like a normal visit would be.  By agreeing to a virtual visit, we'd like you to understand that the technology does not allow for your provider to perform an examination, and thus may limit your provider's ability to fully assess your condition. If your provider identifies any concerns that need to be evaluated in person, we will make arrangements to do so.  Finally, though the technology is pretty good, we cannot assure that it will always work on either your or our end, and in the setting of a video visit, we may have to convert it to a phone-only visit.  In either situation, we cannot ensure that we have a secure connection.  Are you willing to proceed?" STAFF: Did the patient verbally acknowledge consent to telehealth visit? Document  YES/NO here: YES  4. Advise patient to be prepared - "Two hours prior to your appointment, go ahead and check your blood pressure, pulse, oxygen saturation, and your weight (if you have the equipment to check those) and write them all down. When your visit starts, your provider will ask you for this information. If you have an Apple Watch or Kardia device, please plan to have heart rate information ready on the day of your appointment. Please have a pen and paper handy nearby the day of the visit as well."  5. Give patient instructions for MyChart download to smartphone OR Doximity/Doxy.me as below if video visit (depending on what platform provider is using)  6. Inform patient they will receive a phone call 15 minutes prior to their appointment time (may be from unknown caller ID) so they should be prepared to answer    TELEPHONE CALL NOTE  Kerry Matthews has been deemed a candidate for a follow-up tele-health visit to limit community exposure during the Covid-19 pandemic. I spoke with the patient via phone to ensure availability of phone/video source, confirm preferred email & phone number, and discuss instructions and expectations.  I reminded Kerry Matthews to be prepared with any vital sign and/or heart rhythm information that could potentially be obtained via home monitoring, at the time of his visit. I reminded Kerry Matthews to expect a phone call prior to his visit.  Frederic Jericho 12/16/2018 1:42 PM   INSTRUCTIONS FOR DOWNLOADING THE MYCHART APP TO SMARTPHONE  - The patient must first make sure to have activated MyChart and know their login information - If Apple, go to [employer] and type in MyChart in the search bar and download the app. If Android, ask patient to go to [employer] and type in [place] in the search bar and download the app. The app is free but as with any other app downloads, their phone may require them to verify saved payment information or Apple/Android  password.  - The patient will need to then log into the app with their MyChart username and password, and select [facility] as their healthcare provider to link the account. When it is time for your visit, go to the MyChart app, find appointments, and click Begin Video Visit. Be sure to Select Allow for your device to access the Microphone and Camera for your visit. You will then be connected, and your provider will be with you shortly.  **If they have any issues connecting, or need assistance please contact MyChart service desk [phone])**  **If using a computer, in order to ensure the best quality for their visit they will need to use either of the following Internet Browsers: [employer], or Google Chrome**  IF USING DOXIMITY or DOXY.ME - The patient will receive a link just prior to their visit by text.     FULL LENGTH CONSENT FOR TELE-HEALTH VISIT   I hereby voluntarily request, consent and authorize [facility] and its employed or contracted physicians, physician assistants, nurse practitioners or other licensed health care professionals (the Practitioner), to provide me with telemedicine health care services (the Services") as deemed necessary by the treating Practitioner. I acknowledge and consent to receive the Services by the Practitioner via telemedicine. I understand that the telemedicine visit will involve communicating with the Practitioner through live audiovisual communication technology and the disclosure of certain medical information by electronic transmission. I acknowledge that I have been given the opportunity to request an in-person assessment or other available alternative prior to the telemedicine visit and am voluntarily participating in the telemedicine visit.  I understand that I have the right to withhold or withdraw my consent to the use of telemedicine in the course of my care at any time, without affecting my right to future care or treatment,  and that the Practitioner or I may terminate the telemedicine visit at any time. I understand that I have the right to inspect all information obtained and/or recorded in the course of the telemedicine visit and may receive copies of available information for a reasonable fee.  I understand that some of the potential risks of receiving the Services via telemedicine include:   Delay or interruption in medical evaluation due to technological equipment failure or disruption;  Information transmitted may not be sufficient (e.g. poor resolution of images) to allow for appropriate medical decision making by the Practitioner; and/or   In rare instances, security protocols could fail, causing a breach of personal health information.  Furthermore, I acknowledge that it is my responsibility to provide information about my medical history, conditions and care that is complete and accurate to the best of my ability. I acknowledge that Practitioner's advice, recommendations, and/or decision may be based on factors not within their control, such as incomplete or inaccurate data provided by me or distortions of diagnostic images or specimens that may result from electronic transmissions. I understand that the  practice of medicine is not an Chief Strategy Officer and that Practitioner makes no warranties or guarantees regarding treatment outcomes. I acknowledge that I will receive a copy of this consent concurrently upon execution via email to the email address I last provided but may also request a printed copy by calling the office of [facility].    I understand that my insurance will be billed for this visit.   I have read or had this consent read to me.  I understand the contents of this consent, which adequately explains the benefits and risks of the Services being provided via telemedicine.   I have been provided ample opportunity to ask questions regarding this consent and the Services and have had my questions  answered to my satisfaction.  I give my informed consent for the services to be provided through the use of telemedicine in my medical care  By participating in this telemedicine visit I agree to the above.

## 2018-12-20 ENCOUNTER — Encounter: Payer: Self-pay | Admitting: Cardiology

## 2018-12-20 ENCOUNTER — Telehealth (INDEPENDENT_AMBULATORY_CARE_PROVIDER_SITE_OTHER): Payer: Medicare HMO | Admitting: Cardiology

## 2018-12-20 ENCOUNTER — Other Ambulatory Visit: Payer: Self-pay

## 2018-12-20 VITALS — BP 150/76 | Ht 68.0 in | Wt 185.0 lb

## 2018-12-20 DIAGNOSIS — I25811 Atherosclerosis of native coronary artery of transplanted heart without angina pectoris: Secondary | ICD-10-CM

## 2018-12-20 DIAGNOSIS — I10 Essential (primary) hypertension: Secondary | ICD-10-CM | POA: Diagnosis not present

## 2018-12-20 DIAGNOSIS — N189 Chronic kidney disease, unspecified: Secondary | ICD-10-CM

## 2018-12-20 DIAGNOSIS — E782 Mixed hyperlipidemia: Secondary | ICD-10-CM

## 2018-12-20 NOTE — Progress Notes (Signed)
Virtual Visit via Video Note   This visit type was conducted due to national recommendations for restrictions regarding the COVID-19 Pandemic (e.g. social distancing) in an effort to limit this patient's exposure and mitigate transmission in our community.  Due to his co-morbid illnesses, this patient is at least at moderate risk for complications without adequate follow up.  This format is felt to be most appropriate for this patient at this time.  All issues noted in this document were discussed and addressed.  A limited physical exam was performed with this format.  Please refer to the patient's chart for his consent to telehealth for [name].   Evaluation Performed:  Follow-up visit  Date:  12/20/2018   ID:  Kerry Matthews, DOB 1936-01-04, MRN [id number]  Patient Location: Home Provider Location: Home  PCP:  Kerry Brome, MD  Cardiologist:  No primary care provider on file.  Electrophysiologist:  None   Chief Complaint: Coronary artery disease for follow-up  History of Present Illness:    Kerry Matthews is a 83 y.o. male with past medical history of coronary artery disease, essential hypertension, diabetes mellitus and dyslipidemia.  He denies any problems at this time and takes care of activities of daily living.  No chest pain orthopnea or PND.  He walks about a mile 3-4 times a week without any problems.  At the time of my evaluation, the patient is alert awake oriented and in no distress.  The patient does not have symptoms concerning for COVID-19 infection (fever, chills, cough, or new shortness of breath).    Past Medical History:  Diagnosis Date  . Coronary artery disease   . Diabetes ([place])   . Hyperlipidemia   . Hypertension   . Osteoarthritis   . Renal stones   . Skin cancer    Past Surgical History:  Procedure Laterality Date  . BACK SURGERY    . CATARACT EXTRACTION    . TRIGGER FINGER RELEASE       Current Meds  Medication Sig  . aspirin 325 MG EC  tablet Take 325 mg by mouth daily.  Marland Kitchen atorvastatin (LIPITOR) 40 MG tablet Take 40 mg by mouth daily.  . Coenzyme Q10 (CO Q 10) 10 MG CAPS Take 1 capsule by mouth daily.  Marland Kitchen losartan (COZAAR) 50 MG tablet Take 1 tablet by mouth 2 (two) times daily.  . metFORMIN (GLUCOPHAGE) 500 MG tablet Take 1 tablet by mouth daily with breakfast.   . Multiple Vitamins-Minerals (PRESERVISION AREDS 2+MULTI VIT) CAPS Take 1 capsule by mouth 2 (two) times daily.  . nitroGLYCERIN (NITROSTAT) 0.4 MG SL tablet Place 0.4 mg under the tongue every 5 (five) minutes as needed.  . Omega-3 Fatty Acids (FISH OIL) 1000 MG CAPS Take 2 capsules by mouth daily.  Marland Kitchen omeprazole (PRILOSEC) 20 MG capsule Take 20 mg by mouth daily.  . traZODone (DESYREL) 50 MG tablet Take 2 tablets by mouth at bedtime.     Allergies:   Ace inhibitors; Hydrocodone; Hydrocodone-acetaminophen; and Sulfamethoxazole-trimethoprim   Social History   Tobacco Use  . Smoking status: Never Smoker  . Smokeless tobacco: Never Used  Substance Use Topics  . Alcohol use: Not on file  . Drug use: Not on file     Family Hx: The patient's family history includes Clotting disorder in his brother; Heart attack in his sister; Lung disease in his sister; Pancreatic cancer in his sister; Stroke in his father; Tuberculosis in his mother.  ROS:   Please see the  history of present illness.    None All other systems reviewed and are negative.   Prior CV studies:   The following studies were reviewed today:  None  Labs/Other Tests and Data Reviewed:    EKG:  No ECG reviewed.  Recent Labs: No results found for requested labs within last 8760 hours.   Recent Lipid Panel No results found for: CHOL, TRIG, HDL, CHOLHDL, LDLCALC, LDLDIRECT  Wt Readings from Last 3 Encounters:  12/20/18 185 lb (83.9 kg)  05/11/18 185 lb (83.9 kg)     Objective:    Vital Signs:  BP (!) 150/76 (BP Location: Left Arm, Patient Position: Sitting, Cuff Size: Normal)   Ht 5'  8" (1.727 m)   Wt 185 lb (83.9 kg)   BMI 28.13 kg/m    VITAL SIGNS:  reviewed  ASSESSMENT & PLAN:    1. Coronary artery disease: Secondary prevention stressed with the patient.  Importance of compliance with diet and medication stressed and he vocalized understanding.  His blood pressure is stable.  Diet was discussed for diabetes mellitus and weight reduction was stressed.  Importance of regular exercise stressed. 2. His blood pressure is elevated today because he just attended a funeral of [place] otherwise his blood pressures is in the range of 130/70. 3. Patient will be seen in follow-up appointment in 4 months or earlier if the patient has any concerns.  He mentions to me that he will have annual physical in the next month or 2 and will send all his blood work reports to me.   COVID-19 Education: The signs and symptoms of COVID-19 were discussed with the patient and how to seek care for testing (follow up with PCP or arrange E-visit).  The importance of social distancing was discussed today.  Time:   Today, I have spent 16 minutes with the patient with telehealth technology discussing the above problems.     Medication Adjustments/Labs and Tests Ordered: Current medicines are reviewed at length with the patient today.  Concerns regarding medicines are outlined above.   Tests Ordered: No orders of the defined types were placed in this encounter.   Medication Changes: No orders of the defined types were placed in this encounter.   Disposition:  Follow up in 4 month(s)  Signed, Jenean Lindau, MD  12/20/2018 3:00 PM    [place]

## 2018-12-20 NOTE — Patient Instructions (Addendum)

## 2019-01-19 DIAGNOSIS — E119 Type 2 diabetes mellitus without complications: Secondary | ICD-10-CM | POA: Diagnosis not present

## 2019-01-25 DIAGNOSIS — Z0000 Encounter for general adult medical examination without abnormal findings: Secondary | ICD-10-CM | POA: Diagnosis not present

## 2019-01-25 DIAGNOSIS — Z6828 Body mass index (BMI) 28.0-28.9, adult: Secondary | ICD-10-CM | POA: Diagnosis not present

## 2019-02-14 DIAGNOSIS — E881 Lipodystrophy, not elsewhere classified: Secondary | ICD-10-CM | POA: Diagnosis not present

## 2019-02-14 DIAGNOSIS — D508 Other iron deficiency anemias: Secondary | ICD-10-CM | POA: Diagnosis not present

## 2019-02-14 DIAGNOSIS — Z6828 Body mass index (BMI) 28.0-28.9, adult: Secondary | ICD-10-CM | POA: Diagnosis not present

## 2019-02-14 DIAGNOSIS — J438 Other emphysema: Secondary | ICD-10-CM | POA: Diagnosis not present

## 2019-02-14 DIAGNOSIS — E782 Mixed hyperlipidemia: Secondary | ICD-10-CM | POA: Diagnosis not present

## 2019-02-14 DIAGNOSIS — E1121 Type 2 diabetes mellitus with diabetic nephropathy: Secondary | ICD-10-CM | POA: Diagnosis not present

## 2019-02-14 DIAGNOSIS — E1122 Type 2 diabetes mellitus with diabetic chronic kidney disease: Secondary | ICD-10-CM | POA: Diagnosis not present

## 2019-02-14 DIAGNOSIS — E8801 Alpha-1-antitrypsin deficiency: Secondary | ICD-10-CM | POA: Diagnosis not present

## 2019-02-14 DIAGNOSIS — I119 Hypertensive heart disease without heart failure: Secondary | ICD-10-CM | POA: Diagnosis not present

## 2019-02-14 DIAGNOSIS — R05 Cough: Secondary | ICD-10-CM | POA: Diagnosis not present

## 2019-03-06 DIAGNOSIS — R05 Cough: Secondary | ICD-10-CM | POA: Diagnosis not present

## 2019-03-06 DIAGNOSIS — Z20828 Contact with and (suspected) exposure to other viral communicable diseases: Secondary | ICD-10-CM | POA: Diagnosis not present

## 2019-03-06 DIAGNOSIS — R509 Fever, unspecified: Secondary | ICD-10-CM | POA: Diagnosis not present

## 2019-03-11 DIAGNOSIS — R350 Frequency of micturition: Secondary | ICD-10-CM | POA: Diagnosis not present

## 2019-03-14 DIAGNOSIS — R509 Fever, unspecified: Secondary | ICD-10-CM | POA: Diagnosis not present

## 2019-03-17 DIAGNOSIS — R05 Cough: Secondary | ICD-10-CM | POA: Diagnosis not present

## 2019-03-21 DIAGNOSIS — R5383 Other fatigue: Secondary | ICD-10-CM | POA: Diagnosis not present

## 2019-03-21 DIAGNOSIS — R5381 Other malaise: Secondary | ICD-10-CM | POA: Diagnosis not present

## 2019-03-21 DIAGNOSIS — M7918 Myalgia, other site: Secondary | ICD-10-CM | POA: Diagnosis not present

## 2019-03-21 DIAGNOSIS — R5081 Fever presenting with conditions classified elsewhere: Secondary | ICD-10-CM | POA: Diagnosis not present

## 2019-03-21 DIAGNOSIS — R05 Cough: Secondary | ICD-10-CM | POA: Diagnosis not present

## 2019-03-21 DIAGNOSIS — R600 Localized edema: Secondary | ICD-10-CM | POA: Diagnosis not present

## 2019-03-25 DIAGNOSIS — R918 Other nonspecific abnormal finding of lung field: Secondary | ICD-10-CM | POA: Diagnosis not present

## 2019-03-25 DIAGNOSIS — M4186 Other forms of scoliosis, lumbar region: Secondary | ICD-10-CM | POA: Diagnosis not present

## 2019-03-25 DIAGNOSIS — R0602 Shortness of breath: Secondary | ICD-10-CM | POA: Diagnosis not present

## 2019-03-29 ENCOUNTER — Telehealth: Payer: Self-pay | Admitting: Cardiology

## 2019-03-29 NOTE — Telephone Encounter (Signed)
Patient has pneumonia and swelling in both feet. Please advise.

## 2019-03-30 NOTE — Telephone Encounter (Signed)
Patient informed to follow up with primary care.

## 2019-03-30 NOTE — Telephone Encounter (Signed)
This is something that primary care doctor will have to handle.  I can do much about this.  Primary care will have to help him with these issues.

## 2019-03-30 NOTE — Telephone Encounter (Signed)
Patient states he is still sluggish and weak. He is trying to stay hydrated. His swelling in extremities is slightly improved. Dr. Tobie Poet did two chest xrays diagnosed with bilateral pneumonia and CVD19 screen was negative. He has a low grade temperature 99.70F, SPO2-95, HR 68 BP 122/52.He is on his last two days of antibiotics and still on lasix but symptoms still have not resolved. Message forwarded to Dr.RRR for advisement.

## 2019-03-31 DIAGNOSIS — J158 Pneumonia due to other specified bacteria: Secondary | ICD-10-CM | POA: Diagnosis not present

## 2019-03-31 DIAGNOSIS — R600 Localized edema: Secondary | ICD-10-CM | POA: Diagnosis not present

## 2019-03-31 DIAGNOSIS — J029 Acute pharyngitis, unspecified: Secondary | ICD-10-CM | POA: Diagnosis not present

## 2019-03-31 DIAGNOSIS — R509 Fever, unspecified: Secondary | ICD-10-CM | POA: Diagnosis not present

## 2019-03-31 DIAGNOSIS — R748 Abnormal levels of other serum enzymes: Secondary | ICD-10-CM | POA: Diagnosis not present

## 2019-03-31 DIAGNOSIS — J189 Pneumonia, unspecified organism: Secondary | ICD-10-CM | POA: Diagnosis not present

## 2019-03-31 DIAGNOSIS — R05 Cough: Secondary | ICD-10-CM | POA: Diagnosis not present

## 2019-04-01 ENCOUNTER — Emergency Department (HOSPITAL_COMMUNITY): Payer: Medicare HMO

## 2019-04-01 ENCOUNTER — Other Ambulatory Visit: Payer: Self-pay

## 2019-04-01 ENCOUNTER — Inpatient Hospital Stay (HOSPITAL_COMMUNITY)
Admission: EM | Admit: 2019-04-01 | Discharge: 2019-04-06 | DRG: 196 | Disposition: A | Discharge status: Home / Self Care | Payer: Medicare HMO | Attending: Internal Medicine | Admitting: Internal Medicine

## 2019-04-01 ENCOUNTER — Encounter (HOSPITAL_COMMUNITY): Payer: Self-pay | Admitting: Emergency Medicine

## 2019-04-01 DIAGNOSIS — R509 Fever, unspecified: Secondary | ICD-10-CM

## 2019-04-01 DIAGNOSIS — M3320 Polymyositis, organ involvement unspecified: Secondary | ICD-10-CM | POA: Diagnosis not present

## 2019-04-01 DIAGNOSIS — Z7982 Long term (current) use of aspirin: Secondary | ICD-10-CM

## 2019-04-01 DIAGNOSIS — E1169 Type 2 diabetes mellitus with other specified complication: Secondary | ICD-10-CM | POA: Diagnosis present

## 2019-04-01 DIAGNOSIS — J849 Interstitial pulmonary disease, unspecified: Secondary | ICD-10-CM | POA: Diagnosis not present

## 2019-04-01 DIAGNOSIS — M1990 Unspecified osteoarthritis, unspecified site: Secondary | ICD-10-CM | POA: Diagnosis present

## 2019-04-01 DIAGNOSIS — Z888 Allergy status to other drugs, medicaments and biological substances status: Secondary | ICD-10-CM

## 2019-04-01 DIAGNOSIS — N179 Acute kidney failure, unspecified: Secondary | ICD-10-CM | POA: Diagnosis present

## 2019-04-01 DIAGNOSIS — E1122 Type 2 diabetes mellitus with diabetic chronic kidney disease: Secondary | ICD-10-CM | POA: Diagnosis present

## 2019-04-01 DIAGNOSIS — J479 Bronchiectasis, uncomplicated: Secondary | ICD-10-CM | POA: Diagnosis not present

## 2019-04-01 DIAGNOSIS — Z87442 Personal history of urinary calculi: Secondary | ICD-10-CM

## 2019-04-01 DIAGNOSIS — R740 Nonspecific elevation of levels of transaminase and lactic acid dehydrogenase [LDH]: Secondary | ICD-10-CM | POA: Diagnosis not present

## 2019-04-01 DIAGNOSIS — I129 Hypertensive chronic kidney disease with stage 1 through stage 4 chronic kidney disease, or unspecified chronic kidney disease: Secondary | ICD-10-CM | POA: Diagnosis present

## 2019-04-01 DIAGNOSIS — J439 Emphysema, unspecified: Secondary | ICD-10-CM | POA: Diagnosis present

## 2019-04-01 DIAGNOSIS — J8489 Other specified interstitial pulmonary diseases: Secondary | ICD-10-CM | POA: Diagnosis not present

## 2019-04-01 DIAGNOSIS — R0902 Hypoxemia: Secondary | ICD-10-CM | POA: Diagnosis not present

## 2019-04-01 DIAGNOSIS — K523 Indeterminate colitis: Secondary | ICD-10-CM | POA: Diagnosis present

## 2019-04-01 DIAGNOSIS — D689 Coagulation defect, unspecified: Secondary | ICD-10-CM | POA: Diagnosis not present

## 2019-04-01 DIAGNOSIS — J189 Pneumonia, unspecified organism: Secondary | ICD-10-CM | POA: Diagnosis not present

## 2019-04-01 DIAGNOSIS — Z20828 Contact with and (suspected) exposure to other viral communicable diseases: Secondary | ICD-10-CM | POA: Diagnosis not present

## 2019-04-01 DIAGNOSIS — K760 Fatty (change of) liver, not elsewhere classified: Secondary | ICD-10-CM | POA: Diagnosis not present

## 2019-04-01 DIAGNOSIS — I351 Nonrheumatic aortic (valve) insufficiency: Secondary | ICD-10-CM | POA: Diagnosis not present

## 2019-04-01 DIAGNOSIS — J168 Pneumonia due to other specified infectious organisms: Secondary | ICD-10-CM | POA: Diagnosis not present

## 2019-04-01 DIAGNOSIS — D631 Anemia in chronic kidney disease: Secondary | ICD-10-CM | POA: Diagnosis present

## 2019-04-01 DIAGNOSIS — R7989 Other specified abnormal findings of blood chemistry: Secondary | ICD-10-CM

## 2019-04-01 DIAGNOSIS — M609 Myositis, unspecified: Secondary | ICD-10-CM

## 2019-04-01 DIAGNOSIS — R0602 Shortness of breath: Secondary | ICD-10-CM | POA: Diagnosis not present

## 2019-04-01 DIAGNOSIS — I2721 Secondary pulmonary arterial hypertension: Secondary | ICD-10-CM | POA: Diagnosis present

## 2019-04-01 DIAGNOSIS — Z955 Presence of coronary angioplasty implant and graft: Secondary | ICD-10-CM

## 2019-04-01 DIAGNOSIS — N182 Chronic kidney disease, stage 2 (mild): Secondary | ICD-10-CM | POA: Diagnosis not present

## 2019-04-01 DIAGNOSIS — N1831 Chronic kidney disease, stage 3a: Secondary | ICD-10-CM | POA: Diagnosis present

## 2019-04-01 DIAGNOSIS — Z79899 Other long term (current) drug therapy: Secondary | ICD-10-CM

## 2019-04-01 DIAGNOSIS — I2510 Atherosclerotic heart disease of native coronary artery without angina pectoris: Secondary | ICD-10-CM | POA: Diagnosis present

## 2019-04-01 DIAGNOSIS — Z86010 Personal history of colonic polyps: Secondary | ICD-10-CM

## 2019-04-01 DIAGNOSIS — I10 Essential (primary) hypertension: Secondary | ICD-10-CM | POA: Diagnosis present

## 2019-04-01 DIAGNOSIS — E785 Hyperlipidemia, unspecified: Secondary | ICD-10-CM | POA: Diagnosis present

## 2019-04-01 DIAGNOSIS — R609 Edema, unspecified: Secondary | ICD-10-CM | POA: Diagnosis not present

## 2019-04-01 DIAGNOSIS — J984 Other disorders of lung: Secondary | ICD-10-CM | POA: Diagnosis present

## 2019-04-01 DIAGNOSIS — Z885 Allergy status to narcotic agent status: Secondary | ICD-10-CM

## 2019-04-01 DIAGNOSIS — I119 Hypertensive heart disease without heart failure: Secondary | ICD-10-CM | POA: Diagnosis present

## 2019-04-01 DIAGNOSIS — R600 Localized edema: Secondary | ICD-10-CM

## 2019-04-01 DIAGNOSIS — J988 Other specified respiratory disorders: Secondary | ICD-10-CM | POA: Diagnosis not present

## 2019-04-01 DIAGNOSIS — E8801 Alpha-1-antitrypsin deficiency: Secondary | ICD-10-CM | POA: Diagnosis present

## 2019-04-01 DIAGNOSIS — E669 Obesity, unspecified: Secondary | ICD-10-CM | POA: Diagnosis present

## 2019-04-01 DIAGNOSIS — R05 Cough: Secondary | ICD-10-CM

## 2019-04-01 DIAGNOSIS — Z6828 Body mass index (BMI) 28.0-28.9, adult: Secondary | ICD-10-CM | POA: Diagnosis not present

## 2019-04-01 DIAGNOSIS — Z7984 Long term (current) use of oral hypoglycemic drugs: Secondary | ICD-10-CM

## 2019-04-01 DIAGNOSIS — N189 Chronic kidney disease, unspecified: Secondary | ICD-10-CM | POA: Diagnosis present

## 2019-04-01 DIAGNOSIS — K219 Gastro-esophageal reflux disease without esophagitis: Secondary | ICD-10-CM | POA: Diagnosis present

## 2019-04-01 DIAGNOSIS — Z8249 Family history of ischemic heart disease and other diseases of the circulatory system: Secondary | ICD-10-CM

## 2019-04-01 DIAGNOSIS — N281 Cyst of kidney, acquired: Secondary | ICD-10-CM | POA: Diagnosis not present

## 2019-04-01 DIAGNOSIS — R7401 Elevation of levels of liver transaminase levels: Secondary | ICD-10-CM

## 2019-04-01 DIAGNOSIS — E782 Mixed hyperlipidemia: Secondary | ICD-10-CM | POA: Diagnosis not present

## 2019-04-01 DIAGNOSIS — R059 Cough, unspecified: Secondary | ICD-10-CM

## 2019-04-01 DIAGNOSIS — R9389 Abnormal findings on diagnostic imaging of other specified body structures: Secondary | ICD-10-CM | POA: Diagnosis present

## 2019-04-01 DIAGNOSIS — Z85828 Personal history of other malignant neoplasm of skin: Secondary | ICD-10-CM

## 2019-04-01 DIAGNOSIS — Z882 Allergy status to sulfonamides status: Secondary | ICD-10-CM

## 2019-04-01 DIAGNOSIS — R945 Abnormal results of liver function studies: Secondary | ICD-10-CM | POA: Diagnosis not present

## 2019-04-01 DIAGNOSIS — D649 Anemia, unspecified: Secondary | ICD-10-CM | POA: Diagnosis not present

## 2019-04-01 DIAGNOSIS — I340 Nonrheumatic mitral (valve) insufficiency: Secondary | ICD-10-CM | POA: Diagnosis not present

## 2019-04-01 DIAGNOSIS — E538 Deficiency of other specified B group vitamins: Secondary | ICD-10-CM | POA: Diagnosis present

## 2019-04-01 DIAGNOSIS — K4090 Unilateral inguinal hernia, without obstruction or gangrene, not specified as recurrent: Secondary | ICD-10-CM | POA: Diagnosis not present

## 2019-04-01 HISTORY — DX: Type 2 diabetes mellitus with other specified complication: E66.9

## 2019-04-01 HISTORY — DX: Other specified abnormal findings of blood chemistry: R79.89

## 2019-04-01 HISTORY — DX: Pneumonia, unspecified organism: J18.9

## 2019-04-01 HISTORY — DX: Type 2 diabetes mellitus with other specified complication: E11.69

## 2019-04-01 LAB — COMPREHENSIVE METABOLIC PANEL
ALT: 67 U/L — ABNORMAL HIGH (ref 0–44)
AST: 75 U/L — ABNORMAL HIGH (ref 15–41)
Albumin: 2.3 g/dL — ABNORMAL LOW (ref 3.5–5.0)
Alkaline Phosphatase: 198 U/L — ABNORMAL HIGH (ref 38–126)
Anion gap: 11 (ref 5–15)
BUN: 22 mg/dL (ref 8–23)
CO2: 23 mmol/L (ref 22–32)
Calcium: 8.5 mg/dL — ABNORMAL LOW (ref 8.9–10.3)
Chloride: 98 mmol/L (ref 98–111)
Creatinine, Ser: 1.49 mg/dL — ABNORMAL HIGH (ref 0.61–1.24)
GFR calc Af Amer: 50 mL/min — ABNORMAL LOW (ref >60)
GFR calc non Af Amer: 43 mL/min — ABNORMAL LOW (ref >60)
Glucose, Bld: 140 mg/dL — ABNORMAL HIGH (ref 70–99)
Potassium: 4.8 mmol/L (ref 3.5–5.1)
Sodium: 132 mmol/L — ABNORMAL LOW (ref 135–145)
Total Bilirubin: 0.7 mg/dL (ref 0.3–1.2)
Total Protein: 6.3 g/dL — ABNORMAL LOW (ref 6.5–8.1)

## 2019-04-01 LAB — CBC WITH DIFFERENTIAL/PLATELET
Abs Immature Granulocytes: 0.34 10*3/uL — ABNORMAL HIGH (ref 0.00–0.07)
Basophils Absolute: 0.1 10*3/uL (ref 0.0–0.1)
Basophils Relative: 1 %
Eosinophils Absolute: 0.6 10*3/uL — ABNORMAL HIGH (ref 0.0–0.5)
Eosinophils Relative: 3 %
HCT: 35.3 % — ABNORMAL LOW (ref 39.0–52.0)
Hemoglobin: 11.6 g/dL — ABNORMAL LOW (ref 13.0–17.0)
Immature Granulocytes: 2 %
Lymphocytes Relative: 11 %
Lymphs Abs: 2 10*3/uL (ref 0.7–4.0)
MCH: 29.2 pg (ref 26.0–34.0)
MCHC: 32.9 g/dL (ref 30.0–36.0)
MCV: 88.9 fL (ref 80.0–100.0)
Monocytes Absolute: 1.5 10*3/uL — ABNORMAL HIGH (ref 0.1–1.0)
Monocytes Relative: 8 %
Neutro Abs: 13.2 10*3/uL — ABNORMAL HIGH (ref 1.7–7.7)
Neutrophils Relative %: 75 %
Platelets: 554 10*3/uL — ABNORMAL HIGH (ref 150–400)
RBC: 3.97 MIL/uL — ABNORMAL LOW (ref 4.22–5.81)
RDW: 12.9 % (ref 11.5–15.5)
WBC: 17.6 10*3/uL — ABNORMAL HIGH (ref 4.0–10.5)
nRBC: 0 % (ref 0.0–0.2)

## 2019-04-01 LAB — URINALYSIS, ROUTINE W REFLEX MICROSCOPIC
Bilirubin Urine: NEGATIVE
Glucose, UA: NEGATIVE mg/dL
Hgb urine dipstick: NEGATIVE
Ketones, ur: NEGATIVE mg/dL
Leukocytes,Ua: NEGATIVE
Nitrite: NEGATIVE
Protein, ur: NEGATIVE mg/dL
Specific Gravity, Urine: 1.005 (ref 1.005–1.030)
pH: 5 (ref 5.0–8.0)

## 2019-04-01 LAB — BRAIN NATRIURETIC PEPTIDE: B Natriuretic Peptide: 70 pg/mL (ref 0.0–100.0)

## 2019-04-01 LAB — CBC
HCT: 32.3 % — ABNORMAL LOW (ref 39.0–52.0)
Hemoglobin: 10.8 g/dL — ABNORMAL LOW (ref 13.0–17.0)
MCH: 28.7 pg (ref 26.0–34.0)
MCHC: 33.4 g/dL (ref 30.0–36.0)
MCV: 85.9 fL (ref 80.0–100.0)
Platelets: 451 10*3/uL — ABNORMAL HIGH (ref 150–400)
RBC: 3.76 MIL/uL — ABNORMAL LOW (ref 4.22–5.81)
RDW: 12.9 % (ref 11.5–15.5)
WBC: 19.4 10*3/uL — ABNORMAL HIGH (ref 4.0–10.5)
nRBC: 0 % (ref 0.0–0.2)

## 2019-04-01 LAB — GLUCOSE, CAPILLARY: Glucose-Capillary: 224 mg/dL — ABNORMAL HIGH (ref 70–99)

## 2019-04-01 LAB — LACTIC ACID, PLASMA
Lactic Acid, Venous: 1.8 mmol/L (ref 0.5–1.9)
Lactic Acid, Venous: 2.5 mmol/L (ref 0.5–1.9)

## 2019-04-01 LAB — SARS CORONAVIRUS 2 BY RT PCR (HOSPITAL ORDER, PERFORMED IN ~~LOC~~ HOSPITAL LAB): SARS Coronavirus 2: NEGATIVE

## 2019-04-01 LAB — CREATININE, SERUM
Creatinine, Ser: 1.41 mg/dL — ABNORMAL HIGH (ref 0.61–1.24)
GFR calc Af Amer: 54 mL/min — ABNORMAL LOW (ref >60)
GFR calc non Af Amer: 46 mL/min — ABNORMAL LOW (ref >60)

## 2019-04-01 MED ORDER — ASPIRIN 81 MG PO CHEW
324.0000 mg | CHEWABLE_TABLET | Freq: Every day | ORAL | Status: DC
  Administered 2019-04-02 – 2019-04-06 (×5): 324 mg via ORAL
  Filled 2019-04-01 (×5): qty 4

## 2019-04-01 MED ORDER — TRAZODONE HCL 100 MG PO TABS
100.0000 mg | ORAL_TABLET | Freq: Every day | ORAL | Status: DC
  Administered 2019-04-01 – 2019-04-05 (×5): 100 mg via ORAL
  Filled 2019-04-01 (×5): qty 1

## 2019-04-01 MED ORDER — INSULIN ASPART 100 UNIT/ML ~~LOC~~ SOLN
0.0000 [IU] | Freq: Three times a day (TID) | SUBCUTANEOUS | Status: DC
  Administered 2019-04-02: 1 [IU] via SUBCUTANEOUS
  Administered 2019-04-02: 2 [IU] via SUBCUTANEOUS
  Administered 2019-04-03: 3 [IU] via SUBCUTANEOUS

## 2019-04-01 MED ORDER — SODIUM CHLORIDE 0.9 % IV BOLUS
1000.0000 mL | Freq: Once | INTRAVENOUS | Status: AC
  Administered 2019-04-01: 1000 mL via INTRAVENOUS

## 2019-04-01 MED ORDER — SODIUM CHLORIDE 0.9% FLUSH: 3.0000 mL | Freq: Once | INTRAVENOUS | Status: DC

## 2019-04-01 MED ORDER — ACETAMINOPHEN 325 MG PO TABS
650.0000 mg | ORAL_TABLET | Freq: Once | ORAL | Status: AC
  Administered 2019-04-01: 650 mg via ORAL
  Filled 2019-04-01: qty 2

## 2019-04-01 MED ORDER — PANTOPRAZOLE SODIUM 40 MG PO TBEC
40.0000 mg | DELAYED_RELEASE_TABLET | Freq: Every day | ORAL | Status: DC
  Administered 2019-04-02 – 2019-04-06 (×5): 40 mg via ORAL
  Filled 2019-04-01 (×5): qty 1

## 2019-04-01 MED ORDER — OMEGA-3-ACID ETHYL ESTERS 1 G PO CAPS
2.0000 g | ORAL_CAPSULE | Freq: Every day | ORAL | Status: DC
  Administered 2019-04-02 – 2019-04-06 (×5): 2 g via ORAL
  Filled 2019-04-01 (×5): qty 2

## 2019-04-01 MED ORDER — ONDANSETRON HCL 4 MG PO TABS: 4.0000 mg | ORAL_TABLET | Freq: Four times a day (QID) | ORAL | Status: DC | PRN

## 2019-04-01 MED ORDER — ENOXAPARIN SODIUM 40 MG/0.4ML ~~LOC~~ SOLN
40.0000 mg | SUBCUTANEOUS | Status: DC
  Administered 2019-04-01 – 2019-04-05 (×5): 40 mg via SUBCUTANEOUS
  Filled 2019-04-01 (×5): qty 0.4

## 2019-04-01 MED ORDER — SODIUM CHLORIDE 0.9 % IV BOLUS
500.0000 mL | Freq: Once | INTRAVENOUS | Status: AC
  Administered 2019-04-02: 500 mL via INTRAVENOUS

## 2019-04-01 MED ORDER — ACETAMINOPHEN 325 MG PO TABS: 650.0000 mg | ORAL_TABLET | Freq: Four times a day (QID) | ORAL | Status: DC | PRN

## 2019-04-01 MED ORDER — SODIUM CHLORIDE 0.9 % IV SOLN
500.0000 mg | INTRAVENOUS | Status: DC
  Filled 2019-04-01: qty 500

## 2019-04-01 MED ORDER — ACETAMINOPHEN 650 MG RE SUPP: 650.0000 mg | Freq: Four times a day (QID) | RECTAL | Status: DC | PRN

## 2019-04-01 MED ORDER — SODIUM CHLORIDE 0.9 % IV SOLN
1.0000 g | Freq: Once | INTRAVENOUS | Status: AC
  Administered 2019-04-01: 1 g via INTRAVENOUS
  Filled 2019-04-01: qty 10

## 2019-04-01 MED ORDER — SODIUM CHLORIDE 0.9 % IV SOLN
2.0000 g | INTRAVENOUS | Status: DC
  Filled 2019-04-01: qty 20

## 2019-04-01 MED ORDER — NITROGLYCERIN 0.4 MG SL SUBL: 0.4000 mg | SUBLINGUAL_TABLET | SUBLINGUAL | Status: DC | PRN

## 2019-04-01 MED ORDER — IOHEXOL 300 MG/ML  SOLN
100.0000 mL | Freq: Once | INTRAMUSCULAR | Status: AC | PRN
  Administered 2019-04-01: 100 mL via INTRAVENOUS

## 2019-04-01 MED ORDER — SODIUM CHLORIDE 0.9 % IV SOLN
500.0000 mg | Freq: Once | INTRAVENOUS | Status: AC
  Administered 2019-04-01: 500 mg via INTRAVENOUS
  Filled 2019-04-01: qty 500

## 2019-04-01 MED ORDER — ONDANSETRON HCL 4 MG/2ML IJ SOLN: 4.0000 mg | Freq: Four times a day (QID) | INTRAMUSCULAR | Status: DC | PRN

## 2019-04-01 NOTE — ED Notes (Signed)
Pt and wife was given a sandwich and sprite zero.

## 2019-04-01 NOTE — H&P (Signed)
History and Physical    Kerry Matthews YIF:[id number] DOB: Jan 04, 1936 DOA: 04/01/2019  PCP: [name], MD  Patient coming from: Home.  Chief Complaint: Fever cough and shortness of breath.  HPI: Kerry Matthews is a 83 y.o. male with history of CAD, hypertension, diabetes mellitus presents to the ER because of ongoing fever chills nonproductive cough and shortness of breath for the last 10 days.  Patient was placed on antibiotic which patient has been taking last week and has not found any changes in the fever pattern.  Patient's primary care physician advised to come to the ER.  Patient denies any nausea vomiting diarrhea abdominal pain dysuria or discharges.  Patient states over the last 10 days patient also had increasing lower extremity edema for which patient was placed on Lasix.  ED Course: In the ER patient was febrile with temperature of around 100.9.  Labs showed creatinine 1.7 sodium 132 patient's AST was 75 ALT 67 total bilirubin 1.7 alkaline phosphate 198.  WBC count was 19.4 hemoglobin 10.8 platelets 151.  Since patient had elevated LFTs patient underwent sonogram of the abdomen which shows hepatic steatosis.  Given the shortness of breath productive cough patient underwent CT chest and abdomen which shows nothing acute except for which is concerning for interstitial lung disease.  Patient had blood cultures drawn and started on empiric antibiotics for possible pneumonia given the symptoms and admitted for further observation.  On exam patient has right lower extremity more than the left.  Review of Systems: As per HPI, rest all negative.   Past Medical History:  Diagnosis Date  . Coronary artery disease   . Diabetes ([place])   . Hyperlipidemia   . Hypertension   . Osteoarthritis   . Renal stones   . Skin cancer     Past Surgical History:  Procedure Laterality Date  . BACK SURGERY    . CATARACT EXTRACTION    . TRIGGER FINGER RELEASE       reports that he has never  smoked. He has never used smokeless tobacco. No history on file for alcohol and drug.  Allergies  Allergen Reactions  . Ace Inhibitors Other (See Comments)    Slow heart rate  . Hydrocodone Nausea And Vomiting  . Hydrocodone-Acetaminophen Nausea And Vomiting  . Sulfamethoxazole-Trimethoprim     Got sicker    Family History  Problem Relation Age of Onset  . Tuberculosis Mother   . Stroke Father   . Pancreatic cancer Sister   . Heart attack Sister   . Lung disease Sister   . Clotting disorder Brother     Prior to Admission medications   Medication Sig Start Date End Date Taking? Authorizing Provider  aspirin 81 MG EC tablet Take 324 mg by mouth daily.    Yes [provider]  atorvastatin (LIPITOR) 40 MG tablet Take 40 mg by mouth daily at 6 PM.    Yes [provider]  Coenzyme Q10 (CO Q 10) 10 MG CAPS Take 1 capsule by mouth daily.   Yes [provider]  furosemide (LASIX) 40 MG tablet Take 40 mg by mouth daily.    Yes [provider]  losartan (COZAAR) 50 MG tablet Take 1 tablet by mouth 2 (two) times daily. 12/06/18  Yes [provider]  metFORMIN (GLUCOPHAGE) 500 MG tablet Take 500 mg by mouth daily with breakfast.    Yes [provider]  Multiple Vitamins-Minerals (PRESERVISION AREDS 2+MULTI VIT) CAPS Take 1 capsule by mouth 2 (  two) times daily.   Yes [provider]  nitroGLYCERIN (NITROSTAT) 0.4 MG SL tablet Place 0.4 mg under the tongue every 5 (five) minutes as needed.   Yes [provider]  Omega-3 Fatty Acids (FISH OIL) 1000 MG CAPS Take 1,000 mg by mouth 2 (two) times daily.    Yes [provider]  omeprazole (PRILOSEC) 20 MG capsule Take 20 mg by mouth daily.   Yes [provider]  traZODone (DESYREL) 50 MG tablet Take 2 tablets by mouth at bedtime.   Yes [provider]  azithromycin (ZITHROMAX) 250 MG tablet Take 250 mg by mouth daily. 03/31/19   [provider]   levofloxacin (LEVAQUIN) 750 MG tablet Take 750 mg by mouth daily. 03/22/19   [provider]    Physical Exam: Constitutional: Moderately built and nourished. Vitals:   04/01/19 2000 04/01/19 2014 04/01/19 2022 04/01/19 2111  BP: (!) 145/55   (!) 136/58  Pulse: (!) 119   (!) 129  Resp: 18   (!) 21  Temp:  98.2 F (36.8 C) (!) 100.9 F (38.3 C) 99.3 F (37.4 C)  TempSrc:  Oral Rectal Oral  SpO2: 97%   98%   Eyes: Anicteric no pallor. ENMT: No discharge from the ears eyes nose or mouth. Neck: No mass felt.  No neck rigidity. Respiratory: No rhonchi or crepitations. Cardiovascular: S1-S2 heard. Abdomen: Soft nontender bowel sounds present. Musculoskeletal: Edema more on the right than the left. Skin: No rash. Neurologic: Alert awake oriented to time place and person.  Moves all extremities. Psychiatric: Appears normal.   Labs on Admission: I have personally reviewed following labs and imaging studies  CBC: Recent Labs  Lab 04/01/19 1236  WBC 17.6*  NEUTROABS 13.2*  HGB 11.6*  HCT 35.3*  MCV 88.9  PLT 662*   Basic Metabolic Panel: Recent Labs  Lab 04/01/19 1236  NA 132*  K 4.8  CL 98  CO2 23  GLUCOSE 140*  BUN 22  CREATININE 1.49*  CALCIUM 8.5*   GFR: CrCl cannot be calculated (Unknown ideal weight.). Liver Function Tests: Recent Labs  Lab 04/01/19 1236  AST 75*  ALT 67*  ALKPHOS 198*  BILITOT 0.7  PROT 6.3*  ALBUMIN 2.3*   No results for input(s): LIPASE, AMYLASE in the last 168 hours. No results for input(s): AMMONIA in the last 168 hours. Coagulation Profile: No results for input(s): INR, PROTIME in the last 168 hours. Cardiac Enzymes: No results for input(s): CKTOTAL, CKMB, CKMBINDEX, TROPONINI in the last 168 hours. BNP (last 3 results) No results for input(s): PROBNP in the last 8760 hours. HbA1C: No results for input(s): HGBA1C in the last 72 hours. CBG: No results for input(s): GLUCAP in the last 168 hours. Lipid  Profile: No results for input(s): CHOL, HDL, LDLCALC, TRIG, CHOLHDL, LDLDIRECT in the last 72 hours. Thyroid Function Tests: No results for input(s): TSH, T4TOTAL, FREET4, T3FREE, THYROIDAB in the last 72 hours. Anemia Panel: No results for input(s): VITAMINB12, FOLATE, FERRITIN, TIBC, IRON, RETICCTPCT in the last 72 hours. Urine analysis:    Component Value Date/Time   COLORURINE STRAW (A) 04/01/2019 1235   APPEARANCEUR CLEAR 04/01/2019 1235   LABSPEC 1.005 04/01/2019 1235   PHURINE 5.0 04/01/2019 1235   GLUCOSEU NEGATIVE 04/01/2019 1235   HGBUR NEGATIVE 04/01/2019 1235   BILIRUBINUR NEGATIVE 04/01/2019 1235   KETONESUR NEGATIVE 04/01/2019 1235   PROTEINUR NEGATIVE 04/01/2019 1235   NITRITE NEGATIVE 04/01/2019 1235   LEUKOCYTESUR NEGATIVE 04/01/2019 1235   Sepsis  Labs: @LABRCNTIP (procalcitonin:4,lacticidven:4) ) Recent Results (from the past 240 hour(s))  SARS Coronavirus 2 [facility] order, Performed in [facility] hospital lab) Nasopharyngeal Nasopharyngeal Swab     Status: None   Collection Time: 04/01/19  2:30 PM   Specimen: Nasopharyngeal Swab  Result Value Ref Range Status   SARS Coronavirus 2 NEGATIVE NEGATIVE Final    Comment: (NOTE) If result is NEGATIVE SARS-CoV-2 target nucleic acids are NOT DETECTED. The SARS-CoV-2 RNA is generally detectable in upper and lower  respiratory specimens during the acute phase of infection. The lowest  concentration of SARS-CoV-2 viral copies this assay can detect is 250  copies / mL. A negative result does not preclude SARS-CoV-2 infection  and should not be used as the sole basis for treatment or other  patient management decisions.  A negative result may occur with  improper specimen collection / handling, submission of specimen other  than nasopharyngeal swab, presence of viral mutation(s) within the  areas targeted by this assay, and inadequate number of viral copies  (<250 copies / mL). A negative result must be combined with  clinical  observations, patient history, and epidemiological information. If result is POSITIVE SARS-CoV-2 target nucleic acids are DETECTED. The SARS-CoV-2 RNA is generally detectable in upper and lower  respiratory specimens dur ing the acute phase of infection.  Positive  results are indicative of active infection with SARS-CoV-2.  Clinical  correlation with patient history and other diagnostic information is  necessary to determine patient infection status.  Positive results do  not rule out bacterial infection or co-infection with other viruses. If result is PRESUMPTIVE POSTIVE SARS-CoV-2 nucleic acids MAY BE PRESENT.   A presumptive positive result was obtained on the submitted specimen  and confirmed on repeat testing.  While 2019 novel coronavirus  (SARS-CoV-2) nucleic acids may be present in the submitted sample  additional confirmatory testing may be necessary for epidemiological  and / or clinical management purposes  to differentiate between  SARS-CoV-2 and other Sarbecovirus currently known to infect humans.  If clinically indicated additional testing with an alternate test  methodology [phone]) is advised. The SARS-CoV-2 RNA is generally  detectable in upper and lower respiratory sp ecimens during the acute  phase of infection. The expected result is Negative. Fact Sheet for Patients:  StrictlyIdeas.no Fact Sheet for Healthcare Providers: BankingDealers.co.za This test is not yet approved or cleared by the [place] FDA and has been authorized for detection and/or diagnosis of SARS-CoV-2 by FDA under an Emergency Use Authorization (EUA).  This EUA will remain in effect (meaning this test can be used) for the duration of the COVID-19 declaration under Section 564(b)(1) of the Act, 21 U.S.C. section 360bbb-3(b)(1), unless the authorization is terminated or revoked sooner. Performed at [facility], [place]  [address], [place], [place] [address]      Radiological Exams on Admission: Ct Chest W Contrast  Result Date: 04/01/2019 CLINICAL DATA:  Cough and fevers. Negative COVID-19 tests x2. Elevated liver function tests. Acute respiratory illness. EXAM: CT CHEST, ABDOMEN, AND PELVIS WITH CONTRAST TECHNIQUE: Multidetector CT imaging of the chest, abdomen and pelvis was performed following the standard protocol during bolus administration of intravenous contrast. CONTRAST:  140mL OMNIPAQUE IOHEXOL 300 MG/ML  SOLN COMPARISON:  Chest radiograph earlier today.  No comparison CTs. FINDINGS: CT CHEST FINDINGS Cardiovascular: Aortic atherosclerosis. Tortuous thoracic aorta. Borderline cardiomegaly, without pericardial effusion. Multivessel coronary artery atherosclerosis. Pulmonary artery enlargement, outflow tract 3.3 cm. No central pulmonary embolism, on this non-dedicated study.  Mediastinum/Nodes: borderline enlarged right paratracheal node of 1.0 cm on image 27/3. A subcarinal node measures 1.2 cm on 33/3. No hilar adenopathy. Lungs/Pleura: No pleural fluid. Diffuse but slightly basilar predominant subpleural reticulation with traction bronchiolectasis. This is slightly worse on the left. No areas of consolidation or ground-glass to suggest pneumonia. Musculoskeletal: No acute osseous abnormality. CT ABDOMEN PELVIS FINDINGS Hepatobiliary: Suspicion of mild hepatic steatosis. Normal gallbladder, without biliary ductal dilatation. Pancreas: Fatty atrophy throughout the pancreas. Spleen: Normal in size, without focal abnormality. Adrenals/Urinary Tract: Normal adrenal glands. Mild renal cortical thinning bilaterally. Bilateral renal sinus cysts, but no hydronephrosis. Mild bladder distension with minimal right-sided bladder at the origin of the right inguinal hernia. Example image 110/3 and coronal image 55. Stomach/Bowel: Normal stomach, without wall thickening. Scattered colonic diverticula. Normal terminal ileum. Normal small  bowel. Vascular/Lymphatic: Aortic and branch vessel atherosclerosis. No abdominopelvic adenopathy. Reproductive: Normal prostate. Other: No significant free fluid. Right inguinal hernia contains fat and minimal urinary bladder. Musculoskeletal: Mild osteopenia. Convex right lumbar spine curvature. Lumbar spondylosis. IMPRESSION: 1. No acute process in the chest, abdomen, or pelvis. No acute explanation for patient's symptoms. 2. Interstitial lung disease, suspicious for early/mild usual interstitial pneumonitis (typically due to pulmonary fibrosis). Differential considerations include nonspecific interstitial pneumonitis. Depending on ongoing chronic clinical symptomatology, consider high-resolution chest CT at 6-12 months. 3. Suspicion of mild hepatic steatosis. 4. Coronary artery atherosclerosis. Aortic Atherosclerosis (ICD10-I70.0). 5. Pulmonary artery enlargement suggests pulmonary arterial hypertension. 6. Right inguinal hernia containing fat and minimal urinary bladder at its origin. 7. Borderline thoracic adenopathy, favored to be reactive. Electronically Signed   By: Abigail Miyamoto M.D.   On: 04/01/2019 19:06   Ct Abdomen Pelvis W Contrast  Result Date: 04/01/2019 CLINICAL DATA:  Cough and fevers. Negative COVID-19 tests x2. Elevated liver function tests. Acute respiratory illness. EXAM: CT CHEST, ABDOMEN, AND PELVIS WITH CONTRAST TECHNIQUE: Multidetector CT imaging of the chest, abdomen and pelvis was performed following the standard protocol during bolus administration of intravenous contrast. CONTRAST:  154mL OMNIPAQUE IOHEXOL 300 MG/ML  SOLN COMPARISON:  Chest radiograph earlier today.  No comparison CTs. FINDINGS: CT CHEST FINDINGS Cardiovascular: Aortic atherosclerosis. Tortuous thoracic aorta. Borderline cardiomegaly, without pericardial effusion. Multivessel coronary artery atherosclerosis. Pulmonary artery enlargement, outflow tract 3.3 cm. No central pulmonary embolism, on this non-dedicated  study. Mediastinum/Nodes: borderline enlarged right paratracheal node of 1.0 cm on image 27/3. A subcarinal node measures 1.2 cm on 33/3. No hilar adenopathy. Lungs/Pleura: No pleural fluid. Diffuse but slightly basilar predominant subpleural reticulation with traction bronchiolectasis. This is slightly worse on the left. No areas of consolidation or ground-glass to suggest pneumonia. Musculoskeletal: No acute osseous abnormality. CT ABDOMEN PELVIS FINDINGS Hepatobiliary: Suspicion of mild hepatic steatosis. Normal gallbladder, without biliary ductal dilatation. Pancreas: Fatty atrophy throughout the pancreas. Spleen: Normal in size, without focal abnormality. Adrenals/Urinary Tract: Normal adrenal glands. Mild renal cortical thinning bilaterally. Bilateral renal sinus cysts, but no hydronephrosis. Mild bladder distension with minimal right-sided bladder at the origin of the right inguinal hernia. Example image 110/3 and coronal image 55. Stomach/Bowel: Normal stomach, without wall thickening. Scattered colonic diverticula. Normal terminal ileum. Normal small bowel. Vascular/Lymphatic: Aortic and branch vessel atherosclerosis. No abdominopelvic adenopathy. Reproductive: Normal prostate. Other: No significant free fluid. Right inguinal hernia contains fat and minimal urinary bladder. Musculoskeletal: Mild osteopenia. Convex right lumbar spine curvature. Lumbar spondylosis. IMPRESSION: 1. No acute process in the chest, abdomen, or pelvis. No acute explanation for patient's symptoms. 2. Interstitial lung disease, suspicious for early/mild usual interstitial  pneumonitis (typically due to pulmonary fibrosis). Differential considerations include nonspecific interstitial pneumonitis. Depending on ongoing chronic clinical symptomatology, consider high-resolution chest CT at 6-12 months. 3. Suspicion of mild hepatic steatosis. 4. Coronary artery atherosclerosis. Aortic Atherosclerosis (ICD10-I70.0). 5. Pulmonary artery  enlargement suggests pulmonary arterial hypertension. 6. Right inguinal hernia containing fat and minimal urinary bladder at its origin. 7. Borderline thoracic adenopathy, favored to be reactive. Electronically Signed   By: Abigail Miyamoto M.D.   On: 04/01/2019 19:06   Dg Chest Port 1 View  Result Date: 04/01/2019 CLINICAL DATA:  Cough, shortness of breath and weakness for 3-4 months, COVID-19 like symptoms but 2 negative COVID-19 tests EXAM: PORTABLE CHEST 1 VIEW COMPARISON:  Portable exam 1527 hours compared to 03/25/2019 FINDINGS: Normal heart size, mediastinal contours, and pulmonary vascularity. Chronic interstitial lung disease at the periphery of the mid to lower lungs bilaterally greater on LEFT. No definite acute infiltrate, pleural effusion or pneumothorax. Bones demineralized. IMPRESSION: Chronic interstitial disease changes greater on LEFT than RIGHT. No acute abnormalities. Electronically Signed   By: Lavonia Dana M.D.   On: 04/01/2019 15:48   US Abdomen Limited Ruq  Result Date: 04/01/2019 CLINICAL DATA:  Elevated LFTs EXAM: ULTRASOUND ABDOMEN LIMITED RIGHT UPPER QUADRANT COMPARISON:  None. FINDINGS: Gallbladder: No gallstones or wall thickening visualized. No sonographic Murphy sign noted by sonographer. Common bile duct: Diameter: 5.3 mm Liver: Liver is echogenic. No focal hepatic abnormality. Portal vein is patent on color Doppler imaging with normal direction of blood flow towards the liver. Other: None. IMPRESSION: 1. Negative for gallstones or biliary dilatation 2. Echogenic liver consistent with steatosis and or hepatocellular disease. Electronically Signed   By: Donavan Foil M.D.   On: 04/01/2019 16:57     Assessment/Plan Principal Problem:   Pneumonia Active Problems:   Hypertension   Hyperlipidemia   CKD (chronic kidney disease)   Elevated LFTs   Diabetes mellitus type 2 in obese ([place])    1. Fevers source not clear empirically treated for pneumonia -follow cultures urine for  Legionella strep antigen.  Patient had at least 4 COVID-19 test done over the last 10 days including the one done here which were negative. 2. Interstitial lung disease may need pulmonary input.  Will check sed rate. 3. Elevated LFTs abdomen appears benign.  CT scan shows hepatic steatosis.  Will check acute hepatitis panel.  Follow LFTs.  Holding statins due to elevated LFTs may resume if LFTs do not worsen. 4. Lower extremity edema was started on Lasix.  Since creatinine is increased with another baseline creatinine we will hold Lasix for now and check Dopplers of the lower extremity.  Check 2D echo.  CAT scan also shows features concerning for pulmonary hypertension. 5. Diabetes mellitus type 2 we will keep patient on sliding scale coverage. 6. Anemia follow CBC.  Check anemia panel with next blood draw. 7. Renal failure could be from recent use of Lasix.  Will hold Lasix and ARB for now since the only labs we have right now is 1 in 2015.  Follow metabolic panel. 8. Hypertension we will keep patient on PRN IV hydralazine the patient's Cozaar is on hold due to renal failure. 9. CAD on statins and aspirin.  Statins on hold due to elevated LFTs.  May resume back if LFTs do not show increasing pattern.   DVT prophylaxis: Lovenox. Code Status: Full code. Family Communication: Discussed with patient. Disposition Plan: Home. Consults called: None. Admission status: Observation   Rise Patience MD [facility] Pager [phone].  If 7PM-7AM, please contact night-coverage [email or web address] Password [facility]  04/01/2019, 10:24 PM

## 2019-04-01 NOTE — ED Notes (Signed)
Patient transported to CT 

## 2019-04-01 NOTE — Progress Notes (Signed)
CRITICAL VALUE ALERT  Critical Value:  Lactic 2.5  Date & Time Notied:  04/01/2019 at 2210  Provider Notified: On call Provider notified Via Amion  Orders Received/Actions taken:  Waiting for the provider

## 2019-04-01 NOTE — ED Notes (Signed)
ED TO INPATIENT HANDOFF REPORT  ED Nurse Name and Phone #: Kathie Rhodes RN  S Name/Age/Gender [name] 83 y.o. male Room/Bed: 031C/031C  Code Status   Code Status: Not on file  Home/SNF/Other Home Patient oriented to: self, place, time and situation Is this baseline? Yes   Triage Complete: Triage complete  Chief Complaint High white count  Triage Note Pt here for eval of covid like symptoms, states he has had 2 negative covid tests but has continued cough with fevers and elevated WBC. Sent by PCP. Taking Levaquin with no relief.     Allergies Allergies  Allergen Reactions  . Ace Inhibitors Other (See Comments)    Slow heart rate  . Hydrocodone Nausea And Vomiting  . Hydrocodone-Acetaminophen Nausea And Vomiting  . Sulfamethoxazole-Trimethoprim     Got sicker    Level of Care/Admitting Diagnosis ED Disposition    ED Disposition Condition Comment   Admit  Hospital Area: [facility] [[id number]]  Level of Care: Telemetry Medical [104]  I expect the patient will be discharged within 24 hours: No (not a candidate for 5C-Observation unit)  Covid Evaluation: N/A  Diagnosis: Pneumonia [427062]  Admitting Physician: Rise Patience [id number]  Attending Physician: Rise Patience [email or web address]  PT Class (Do Not Modify): Observation [104]  PT Acc Code (Do Not Modify): Observation [10022]       B Medical/Surgery History Past Medical History:  Diagnosis Date  . Coronary artery disease   . Diabetes ([place])   . Hyperlipidemia   . Hypertension   . Osteoarthritis   . Renal stones   . Skin cancer    Past Surgical History:  Procedure Laterality Date  . BACK SURGERY    . CATARACT EXTRACTION    . TRIGGER FINGER RELEASE       A IV Location/Drains/Wounds Patient Lines/Drains/Airways Status   Active Line/Drains/Airways    Name:   Placement date:   Placement time:   Site:   Days:   Peripheral IV 04/01/19 Right Hand   04/01/19    1444    Hand   less  than 1          Intake/Output Last 24 hours  Intake/Output Summary (Last 24 hours) at 04/01/2019 2011 Last data filed at 04/01/2019 1756 Gross per 24 hour  Intake 1348.62 ml  Output -  Net 1348.62 ml    Labs/Imaging Results for orders placed or performed during the hospital encounter of 04/01/19 (from the past 48 hour(s))  Urinalysis, Routine w reflex microscopic     Status: Abnormal   Collection Time: 04/01/19 12:35 PM  Result Value Ref Range   Color, Urine STRAW (A) YELLOW   APPearance CLEAR CLEAR   Specific Gravity, Urine 1.005 1.005 - 1.030   pH 5.0 5.0 - 8.0   Glucose, UA NEGATIVE NEGATIVE mg/dL   Hgb urine dipstick NEGATIVE NEGATIVE   Bilirubin Urine NEGATIVE NEGATIVE   Ketones, ur NEGATIVE NEGATIVE mg/dL   Protein, ur NEGATIVE NEGATIVE mg/dL   Nitrite NEGATIVE NEGATIVE   Leukocytes,Ua NEGATIVE NEGATIVE    Comment: Performed at [facility], [place] [address] [place], [place] [address]  Comprehensive metabolic panel     Status: Abnormal   Collection Time: 04/01/19 12:36 PM  Result Value Ref Range   Sodium 132 (L) 135 - 145 mmol/L   Potassium 4.8 3.5 - 5.1 mmol/L   Chloride 98 98 - 111 mmol/L   CO2 23 22 - 32 mmol/L   Glucose, Bld  140 (H) 70 - 99 mg/dL   BUN 22 8 - 23 mg/dL   Creatinine, Ser 1.49 (H) 0.61 - 1.24 mg/dL   Calcium 8.5 (L) 8.9 - 10.3 mg/dL   Total Protein 6.3 (L) 6.5 - 8.1 g/dL   Albumin 2.3 (L) 3.5 - 5.0 g/dL   AST 75 (H) 15 - 41 U/L   ALT 67 (H) 0 - 44 U/L   Alkaline Phosphatase 198 (H) 38 - 126 U/L   Total Bilirubin 0.7 0.3 - 1.2 mg/dL   GFR calc non Af Amer 43 (L) >60 mL/min   GFR calc Af Amer 50 (L) >60 mL/min   Anion gap 11 5 - 15    Comment: Performed at [facility] [address] [place], [place] [address]  CBC with Differential     Status: Abnormal   Collection Time: 04/01/19 12:36 PM  Result Value Ref Range   WBC 17.6 (H) 4.0 - 10.5 K/uL   RBC 3.97 (L) 4.22 - 5.81 MIL/uL   Hemoglobin 11.6 (L) 13.0 - 17.0 g/dL   HCT 35.3  (L) 39.0 - 52.0 %   MCV 88.9 80.0 - 100.0 fL   MCH 29.2 26.0 - 34.0 pg   MCHC 32.9 30.0 - 36.0 g/dL   RDW 12.9 11.5 - 15.5 %   Platelets 554 (H) 150 - 400 K/uL   nRBC 0.0 0.0 - 0.2 %   Neutrophils Relative % 75 %   Neutro Abs 13.2 (H) 1.7 - 7.7 K/uL   Lymphocytes Relative 11 %   Lymphs Abs 2.0 0.7 - 4.0 K/uL   Monocytes Relative 8 %   Monocytes Absolute 1.5 (H) 0.1 - 1.0 K/uL   Eosinophils Relative 3 %   Eosinophils Absolute 0.6 (H) 0.0 - 0.5 K/uL   Basophils Relative 1 %   Basophils Absolute 0.1 0.0 - 0.1 K/uL   Immature Granulocytes 2 %   Abs Immature Granulocytes 0.34 (H) 0.00 - 0.07 K/uL    Comment: Performed at [facility], [address] [place], [place] [address]  Brain natriuretic peptide     Status: None   Collection Time: 04/01/19  2:17 PM  Result Value Ref Range   B Natriuretic Peptide 70.0 0.0 - 100.0 pg/mL    Comment: Performed at [facility] [address] [place], [place] [address]  SARS Coronavirus 2 [facility] order, Performed in [facility] hospital lab) Nasopharyngeal Nasopharyngeal Swab     Status: None   Collection Time: 04/01/19  2:30 PM   Specimen: Nasopharyngeal Swab  Result Value Ref Range   SARS Coronavirus 2 NEGATIVE NEGATIVE    Comment: (NOTE) If result is NEGATIVE SARS-CoV-2 target nucleic acids are NOT DETECTED. The SARS-CoV-2 RNA is generally detectable in upper and lower  respiratory specimens during the acute phase of infection. The lowest  concentration of SARS-CoV-2 viral copies this assay can detect is 250  copies / mL. A negative result does not preclude SARS-CoV-2 infection  and should not be used as the sole basis for treatment or other  patient management decisions.  A negative result may occur with  improper specimen collection / handling, submission of specimen other  than nasopharyngeal swab, presence of viral mutation(s) within the  areas targeted by this assay, and inadequate number of viral copies  (<250 copies /  mL). A negative result must be combined with clinical  observations, patient history, and epidemiological information. If result is POSITIVE SARS-CoV-2 target nucleic acids are  DETECTED. The SARS-CoV-2 RNA is generally detectable in upper and lower  respiratory specimens dur ing the acute phase of infection.  Positive  results are indicative of active infection with SARS-CoV-2.  Clinical  correlation with patient history and other diagnostic information is  necessary to determine patient infection status.  Positive results do  not rule out bacterial infection or co-infection with other viruses. If result is PRESUMPTIVE POSTIVE SARS-CoV-2 nucleic acids MAY BE PRESENT.   A presumptive positive result was obtained on the submitted specimen  and confirmed on repeat testing.  While 2019 novel coronavirus  (SARS-CoV-2) nucleic acids may be present in the submitted sample  additional confirmatory testing may be necessary for epidemiological  and / or clinical management purposes  to differentiate between  SARS-CoV-2 and other Sarbecovirus currently known to infect humans.  If clinically indicated additional testing with an alternate test  methodology [phone]) is advised. The SARS-CoV-2 RNA is generally  detectable in upper and lower respiratory sp ecimens during the acute  phase of infection. The expected result is Negative. Fact Sheet for Patients:  StrictlyIdeas.no Fact Sheet for Healthcare Providers: BankingDealers.co.za This test is not yet approved or cleared by the [place] FDA and has been authorized for detection and/or diagnosis of SARS-CoV-2 by FDA under an Emergency Use Authorization (EUA).  This EUA will remain in effect (meaning this test can be used) for the duration of the COVID-19 declaration under Section 564(b)(1) of the Act, 21 U.S.C. section 360bbb-3(b)(1), unless the authorization is terminated or revoked  sooner. Performed at [facility], [place] [address] [place], [place] [address]   Lactic acid, plasma     Status: None   Collection Time: 04/01/19  4:10 PM  Result Value Ref Range   Lactic Acid, Venous 1.8 0.5 - 1.9 mmol/L    Comment: Performed at [facility] [address], [place], [place] [address]   Ct Chest W Contrast  Result Date: 04/01/2019 CLINICAL DATA:  Cough and fevers. Negative COVID-19 tests x2. Elevated liver function tests. Acute respiratory illness. EXAM: CT CHEST, ABDOMEN, AND PELVIS WITH CONTRAST TECHNIQUE: Multidetector CT imaging of the chest, abdomen and pelvis was performed following the standard protocol during bolus administration of intravenous contrast. CONTRAST:  149mL OMNIPAQUE IOHEXOL 300 MG/ML  SOLN COMPARISON:  Chest radiograph earlier today.  No comparison CTs. FINDINGS: CT CHEST FINDINGS Cardiovascular: Aortic atherosclerosis. Tortuous thoracic aorta. Borderline cardiomegaly, without pericardial effusion. Multivessel coronary artery atherosclerosis. Pulmonary artery enlargement, outflow tract 3.3 cm. No central pulmonary embolism, on this non-dedicated study. Mediastinum/Nodes: borderline enlarged right paratracheal node of 1.0 cm on image 27/3. A subcarinal node measures 1.2 cm on 33/3. No hilar adenopathy. Lungs/Pleura: No pleural fluid. Diffuse but slightly basilar predominant subpleural reticulation with traction bronchiolectasis. This is slightly worse on the left. No areas of consolidation or ground-glass to suggest pneumonia. Musculoskeletal: No acute osseous abnormality. CT ABDOMEN PELVIS FINDINGS Hepatobiliary: Suspicion of mild hepatic steatosis. Normal gallbladder, without biliary ductal dilatation. Pancreas: Fatty atrophy throughout the pancreas. Spleen: Normal in size, without focal abnormality. Adrenals/Urinary Tract: Normal adrenal glands. Mild renal cortical thinning bilaterally. Bilateral renal sinus cysts, but no hydronephrosis. Mild bladder  distension with minimal right-sided bladder at the origin of the right inguinal hernia. Example image 110/3 and coronal image 55. Stomach/Bowel: Normal stomach, without wall thickening. Scattered colonic diverticula. Normal terminal ileum. Normal small bowel. Vascular/Lymphatic: Aortic and branch vessel atherosclerosis. No abdominopelvic adenopathy. Reproductive: Normal prostate. Other: No significant free fluid. Right inguinal hernia  contains fat and minimal urinary bladder. Musculoskeletal: Mild osteopenia. Convex right lumbar spine curvature. Lumbar spondylosis. IMPRESSION: 1. No acute process in the chest, abdomen, or pelvis. No acute explanation for patient's symptoms. 2. Interstitial lung disease, suspicious for early/mild usual interstitial pneumonitis (typically due to pulmonary fibrosis). Differential considerations include nonspecific interstitial pneumonitis. Depending on ongoing chronic clinical symptomatology, consider high-resolution chest CT at 6-12 months. 3. Suspicion of mild hepatic steatosis. 4. Coronary artery atherosclerosis. Aortic Atherosclerosis (ICD10-I70.0). 5. Pulmonary artery enlargement suggests pulmonary arterial hypertension. 6. Right inguinal hernia containing fat and minimal urinary bladder at its origin. 7. Borderline thoracic adenopathy, favored to be reactive. Electronically Signed   By: Abigail Miyamoto M.D.   On: 04/01/2019 19:06   Ct Abdomen Pelvis W Contrast  Result Date: 04/01/2019 CLINICAL DATA:  Cough and fevers. Negative COVID-19 tests x2. Elevated liver function tests. Acute respiratory illness. EXAM: CT CHEST, ABDOMEN, AND PELVIS WITH CONTRAST TECHNIQUE: Multidetector CT imaging of the chest, abdomen and pelvis was performed following the standard protocol during bolus administration of intravenous contrast. CONTRAST:  169mL OMNIPAQUE IOHEXOL 300 MG/ML  SOLN COMPARISON:  Chest radiograph earlier today.  No comparison CTs. FINDINGS: CT CHEST FINDINGS Cardiovascular: Aortic  atherosclerosis. Tortuous thoracic aorta. Borderline cardiomegaly, without pericardial effusion. Multivessel coronary artery atherosclerosis. Pulmonary artery enlargement, outflow tract 3.3 cm. No central pulmonary embolism, on this non-dedicated study. Mediastinum/Nodes: borderline enlarged right paratracheal node of 1.0 cm on image 27/3. A subcarinal node measures 1.2 cm on 33/3. No hilar adenopathy. Lungs/Pleura: No pleural fluid. Diffuse but slightly basilar predominant subpleural reticulation with traction bronchiolectasis. This is slightly worse on the left. No areas of consolidation or ground-glass to suggest pneumonia. Musculoskeletal: No acute osseous abnormality. CT ABDOMEN PELVIS FINDINGS Hepatobiliary: Suspicion of mild hepatic steatosis. Normal gallbladder, without biliary ductal dilatation. Pancreas: Fatty atrophy throughout the pancreas. Spleen: Normal in size, without focal abnormality. Adrenals/Urinary Tract: Normal adrenal glands. Mild renal cortical thinning bilaterally. Bilateral renal sinus cysts, but no hydronephrosis. Mild bladder distension with minimal right-sided bladder at the origin of the right inguinal hernia. Example image 110/3 and coronal image 55. Stomach/Bowel: Normal stomach, without wall thickening. Scattered colonic diverticula. Normal terminal ileum. Normal small bowel. Vascular/Lymphatic: Aortic and branch vessel atherosclerosis. No abdominopelvic adenopathy. Reproductive: Normal prostate. Other: No significant free fluid. Right inguinal hernia contains fat and minimal urinary bladder. Musculoskeletal: Mild osteopenia. Convex right lumbar spine curvature. Lumbar spondylosis. IMPRESSION: 1. No acute process in the chest, abdomen, or pelvis. No acute explanation for patient's symptoms. 2. Interstitial lung disease, suspicious for early/mild usual interstitial pneumonitis (typically due to pulmonary fibrosis). Differential considerations include nonspecific interstitial  pneumonitis. Depending on ongoing chronic clinical symptomatology, consider high-resolution chest CT at 6-12 months. 3. Suspicion of mild hepatic steatosis. 4. Coronary artery atherosclerosis. Aortic Atherosclerosis (ICD10-I70.0). 5. Pulmonary artery enlargement suggests pulmonary arterial hypertension. 6. Right inguinal hernia containing fat and minimal urinary bladder at its origin. 7. Borderline thoracic adenopathy, favored to be reactive. Electronically Signed   By: Abigail Miyamoto M.D.   On: 04/01/2019 19:06   Dg Chest Port 1 View  Result Date: 04/01/2019 CLINICAL DATA:  Cough, shortness of breath and weakness for 3-4 months, COVID-19 like symptoms but 2 negative COVID-19 tests EXAM: PORTABLE CHEST 1 VIEW COMPARISON:  Portable exam 1527 hours compared to 03/25/2019 FINDINGS: Normal heart size, mediastinal contours, and pulmonary vascularity. Chronic interstitial lung disease at the periphery of the mid to lower lungs bilaterally greater on LEFT. No definite acute infiltrate, pleural effusion or pneumothorax. Bones  demineralized. IMPRESSION: Chronic interstitial disease changes greater on LEFT than RIGHT. No acute abnormalities. Electronically Signed   By: Lavonia Dana M.D.   On: 04/01/2019 15:48   US Abdomen Limited Ruq  Result Date: 04/01/2019 CLINICAL DATA:  Elevated LFTs EXAM: ULTRASOUND ABDOMEN LIMITED RIGHT UPPER QUADRANT COMPARISON:  None. FINDINGS: Gallbladder: No gallstones or wall thickening visualized. No sonographic Murphy sign noted by sonographer. Common bile duct: Diameter: 5.3 mm Liver: Liver is echogenic. No focal hepatic abnormality. Portal vein is patent on color Doppler imaging with normal direction of blood flow towards the liver. Other: None. IMPRESSION: 1. Negative for gallstones or biliary dilatation 2. Echogenic liver consistent with steatosis and or hepatocellular disease. Electronically Signed   By: Donavan Foil M.D.   On: 04/01/2019 16:57    Pending Labs Unresulted Labs (From  admission, onward)    Start     Ordered   04/01/19 1541  Lactic acid, plasma  Now then every 2 hours,   STAT     04/01/19 1540   04/01/19 1349  Culture, blood (Routine X 2) w Reflex to ID Panel  BLOOD CULTURE X 2,   R (with STAT occurrences)     04/01/19 1348          Vitals/Pain Today's Vitals   04/01/19 1600 04/01/19 1645 04/01/19 1715 04/01/19 1730  BP: 134/68 (!) 143/67 (!) 147/83 (!) 142/72  Pulse: 89 92 95 99  Resp: 16 19 (!) 25 (!) 24  Temp:      TempSrc:      SpO2: 98% 98% 99% 94%  PainSc:        Isolation Precautions No active isolations  Medications Medications  sodium chloride flush (NS) 0.9 % injection 3 mL (has no administration in time range)  sodium chloride 0.9 % bolus 1,000 mL (0 mLs Intravenous Stopped 04/01/19 1557)  cefTRIAXone (ROCEPHIN) 1 g in sodium chloride 0.9 % 100 mL IVPB (0 g Intravenous Stopped 04/01/19 1643)  azithromycin (ZITHROMAX) 500 mg in sodium chloride 0.9 % 250 mL IVPB (0 mg Intravenous Stopped 04/01/19 1756)  iohexol (OMNIPAQUE) 300 MG/ML solution 100 mL (100 mLs Intravenous Contrast Given 04/01/19 1816)    Mobility walks High fall risk   Focused Assessments Pulmonary Assessment Handoff:  Lung sounds:   O2 Device: Room Air        R Recommendations: See Admitting Provider Note  Report given to:   Additional Notes: Pt alert and oriented at this time.

## 2019-04-01 NOTE — ED Provider Notes (Signed)
[facility] Emergency Department Provider Note MRN:  [id number]  Arrival date & time: 04/01/19     Chief Complaint   Fever and Cough   History of Present Illness   Armen Waring is a 83 y.o. year-old male with a history of hypertension hyperlipidemia and, diabetes presenting to the ED with chief complaint of fever and cough.  Patient began having increased cough 11 days ago, accompanied by subjective fever.  Was tested for coronavirus and it was negative.  Chest x-ray was consistent with pneumonia.  Was started on azithromycin, which did not help.  Continued symptoms and x-ray was repeated as well as coronavirus test on 3 August, continue pneumonia, coronavirus test negative again.  Patient continues to feel unwell, continued cough, malaise, low energy, denies chest pain, no abdominal pain, no numbness or weakness to the arms or legs.  Sent here by PCP for continued possible pneumonia as well as elevated LFTs today in the office.  Review of Systems  A complete 10 system review of systems was obtained and all systems are negative except as noted in the HPI and PMH.   Patient's Health History    Past Medical History:  Diagnosis Date  . Coronary artery disease   . Diabetes ([place])   . Hyperlipidemia   . Hypertension   . Osteoarthritis   . Renal stones   . Skin cancer     Past Surgical History:  Procedure Laterality Date  . BACK SURGERY    . CATARACT EXTRACTION    . TRIGGER FINGER RELEASE      Family History  Problem Relation Age of Onset  . Tuberculosis Mother   . Stroke Father   . Pancreatic cancer Sister   . Heart attack Sister   . Lung disease Sister   . Clotting disorder Brother     Social History   Socioeconomic History  . Marital status: Married    Spouse name: Not on file  . Number of children: Not on file  . Years of education: Not on file  . Highest education level: Not on file  Occupational History  . Not on file  Social Needs  .  Financial resource strain: Not on file  . Food insecurity    Worry: Not on file    Inability: Not on file  . Transportation needs    Medical: Not on file    Non-medical: Not on file  Tobacco Use  . Smoking status: Never Smoker  . Smokeless tobacco: Never Used  Substance and Sexual Activity  . Alcohol use: Not on file  . Drug use: Not on file  . Sexual activity: Not on file  Lifestyle  . Physical activity    Days per week: Not on file    Minutes per session: Not on file  . Stress: Not on file  Relationships  . Social Herbalist on phone: Not on file    Gets together: Not on file    Attends religious service: Not on file    Active member of club or organization: Not on file    Attends meetings of clubs or organizations: Not on file    Relationship status: Not on file  . Intimate partner violence    Fear of current or ex partner: Not on file    Emotionally abused: Not on file    Physically abused: Not on file    Forced sexual activity: Not on file  Other Topics Concern  . Not  on file  Social History Narrative  . Not on file     Physical Exam  Vital Signs and Nursing Notes reviewed Vitals:   04/01/19 1445 04/01/19 1515  BP: (!) 125/58 139/71  Pulse:  74  Resp: 16 (!) 24  Temp:    SpO2:  99%    CONSTITUTIONAL: Well-appearing, NAD NEURO:  Alert and oriented x 3, no focal deficits EYES:  eyes equal and reactive ENT/NECK:  no LAD, no JVD CARDIO: Tachycardic rate, well-perfused, normal S1 and S2 PULM:  CTAB no wheezing or rhonchi GI/GU:  normal bowel sounds, non-distended, non-tender MSK/SPINE:  No gross deformities, no edema SKIN:  no rash, atraumatic PSYCH:  Appropriate speech and behavior  Diagnostic and Interventional Summary    Labs Reviewed  COMPREHENSIVE METABOLIC PANEL - Abnormal; Notable for the following components:      Result Value   Sodium 132 (*)    Glucose, Bld 140 (*)    Creatinine, Ser 1.49 (*)    Calcium 8.5 (*)    Total Protein  6.3 (*)    Albumin 2.3 (*)    AST 75 (*)    ALT 67 (*)    Alkaline Phosphatase 198 (*)    GFR calc non Af Amer 43 (*)    GFR calc Af Amer 50 (*)    All other components within normal limits  CBC WITH DIFFERENTIAL/PLATELET - Abnormal; Notable for the following components:   WBC 17.6 (*)    RBC 3.97 (*)    Hemoglobin 11.6 (*)    HCT 35.3 (*)    Platelets 554 (*)    Neutro Abs 13.2 (*)    Monocytes Absolute 1.5 (*)    Eosinophils Absolute 0.6 (*)    Abs Immature Granulocytes 0.34 (*)    All other components within normal limits  URINALYSIS, ROUTINE W REFLEX MICROSCOPIC - Abnormal; Notable for the following components:   Color, Urine STRAW (*)    All other components within normal limits  SARS CORONAVIRUS 2 (HOSPITAL ORDER, PERFORMED [facility])  CULTURE, BLOOD (ROUTINE X 2)  CULTURE, BLOOD (ROUTINE X 2)  BRAIN NATRIURETIC PEPTIDE    DG Chest Port 1 View    (Results Pending)    Medications  sodium chloride flush (NS) 0.9 % injection 3 mL (has no administration in time range)  sodium chloride 0.9 % bolus 1,000 mL (1,000 mLs Intravenous New Bag/Given 04/01/19 1419)     Procedures Critical Care  ED Course and Medical Decision Making  I have reviewed the triage vital signs and the nursing notes.  Pertinent labs & imaging results that were available during my care of the patient were reviewed by me and considered in my medical decision making (see below for details).  Concern for pneumonia failing outpatient management in this 83 year old male.  Patient's wife reports that patient has a history of alpha-1 antitrypsin deficiency and has chronic cough at baseline but it is worse.  X-ray and labs are pending, anticipating admission for pneumonia.  Signed out to oncoming provider at shift change.  Barth Kirks. Sedonia Small, MD [place] [email or web address]  Final Clinical Impressions(s) / ED Diagnoses     ICD-10-CM   1.  Pneumonia due to infectious organism, unspecified laterality, unspecified part of lung  J18.9   2. Cough  R05 DG Chest [facility] 1 View    DG Chest Port 1 View    ED Discharge Orders    None  Maudie Flakes, MD 04/01/19 [phone]

## 2019-04-01 NOTE — ED Triage Notes (Signed)
Pt here for eval of covid like symptoms, states he has had 2 negative covid tests but has continued cough with fevers and elevated WBC. Sent by PCP. Taking Levaquin with no relief.

## 2019-04-02 ENCOUNTER — Observation Stay (HOSPITAL_BASED_OUTPATIENT_CLINIC_OR_DEPARTMENT_OTHER): Payer: Medicare HMO

## 2019-04-02 DIAGNOSIS — J984 Other disorders of lung: Secondary | ICD-10-CM | POA: Diagnosis present

## 2019-04-02 DIAGNOSIS — R609 Edema, unspecified: Secondary | ICD-10-CM | POA: Diagnosis not present

## 2019-04-02 DIAGNOSIS — E782 Mixed hyperlipidemia: Secondary | ICD-10-CM

## 2019-04-02 DIAGNOSIS — R059 Cough, unspecified: Secondary | ICD-10-CM

## 2019-04-02 DIAGNOSIS — D649 Anemia, unspecified: Secondary | ICD-10-CM | POA: Diagnosis present

## 2019-04-02 DIAGNOSIS — R05 Cough: Secondary | ICD-10-CM

## 2019-04-02 DIAGNOSIS — R600 Localized edema: Secondary | ICD-10-CM

## 2019-04-02 DIAGNOSIS — R9389 Abnormal findings on diagnostic imaging of other specified body structures: Secondary | ICD-10-CM | POA: Diagnosis present

## 2019-04-02 DIAGNOSIS — E1169 Type 2 diabetes mellitus with other specified complication: Secondary | ICD-10-CM

## 2019-04-02 DIAGNOSIS — J189 Pneumonia, unspecified organism: Secondary | ICD-10-CM

## 2019-04-02 DIAGNOSIS — N179 Acute kidney failure, unspecified: Secondary | ICD-10-CM | POA: Diagnosis present

## 2019-04-02 DIAGNOSIS — E669 Obesity, unspecified: Secondary | ICD-10-CM

## 2019-04-02 DIAGNOSIS — R509 Fever, unspecified: Secondary | ICD-10-CM

## 2019-04-02 LAB — BASIC METABOLIC PANEL
Anion gap: 11 (ref 5–15)
BUN: 22 mg/dL (ref 8–23)
CO2: 23 mmol/L (ref 22–32)
Calcium: 7.8 mg/dL — ABNORMAL LOW (ref 8.9–10.3)
Chloride: 98 mmol/L (ref 98–111)
Creatinine, Ser: 1.26 mg/dL — ABNORMAL HIGH (ref 0.61–1.24)
GFR calc Af Amer: >60 mL/min (ref >60)
GFR calc non Af Amer: 53 mL/min — ABNORMAL LOW (ref >60)
Glucose, Bld: 129 mg/dL — ABNORMAL HIGH (ref 70–99)
Potassium: 4.2 mmol/L (ref 3.5–5.1)
Sodium: 132 mmol/L — ABNORMAL LOW (ref 135–145)

## 2019-04-02 LAB — CBC WITH DIFFERENTIAL/PLATELET
Abs Immature Granulocytes: 0.27 10*3/uL — ABNORMAL HIGH (ref 0.00–0.07)
Basophils Absolute: 0.1 10*3/uL (ref 0.0–0.1)
Basophils Relative: 0 %
Eosinophils Absolute: 0.1 10*3/uL (ref 0.0–0.5)
Eosinophils Relative: 1 %
HCT: 31.4 % — ABNORMAL LOW (ref 39.0–52.0)
Hemoglobin: 10.4 g/dL — ABNORMAL LOW (ref 13.0–17.0)
Immature Granulocytes: 2 %
Lymphocytes Relative: 9 %
Lymphs Abs: 1.6 10*3/uL (ref 0.7–4.0)
MCH: 29 pg (ref 26.0–34.0)
MCHC: 33.1 g/dL (ref 30.0–36.0)
MCV: 87.5 fL (ref 80.0–100.0)
Monocytes Absolute: 1.6 10*3/uL — ABNORMAL HIGH (ref 0.1–1.0)
Monocytes Relative: 9 %
Neutro Abs: 14.3 10*3/uL — ABNORMAL HIGH (ref 1.7–7.7)
Neutrophils Relative %: 79 %
Platelets: 440 10*3/uL — ABNORMAL HIGH (ref 150–400)
RBC: 3.59 MIL/uL — ABNORMAL LOW (ref 4.22–5.81)
RDW: 12.9 % (ref 11.5–15.5)
WBC: 17.9 10*3/uL — ABNORMAL HIGH (ref 4.0–10.5)
nRBC: 0 % (ref 0.0–0.2)

## 2019-04-02 LAB — RESPIRATORY PANEL BY PCR

## 2019-04-02 LAB — GLUCOSE, CAPILLARY
Glucose-Capillary: 120 mg/dL — ABNORMAL HIGH (ref 70–99)
Glucose-Capillary: 153 mg/dL — ABNORMAL HIGH (ref 70–99)
Glucose-Capillary: 133 mg/dL — ABNORMAL HIGH (ref 70–99)
Glucose-Capillary: 252 mg/dL — ABNORMAL HIGH (ref 70–99)

## 2019-04-02 LAB — HEPATIC FUNCTION PANEL
ALT: 47 U/L — ABNORMAL HIGH (ref 0–44)
AST: 42 U/L — ABNORMAL HIGH (ref 15–41)
Albumin: 1.8 g/dL — ABNORMAL LOW (ref 3.5–5.0)
Alkaline Phosphatase: 167 U/L — ABNORMAL HIGH (ref 38–126)
Bilirubin, Direct: 0.3 mg/dL — ABNORMAL HIGH (ref 0.0–0.2)
Indirect Bilirubin: 0.6 mg/dL (ref 0.3–0.9)
Total Bilirubin: 0.9 mg/dL (ref 0.3–1.2)
Total Protein: 5.3 g/dL — ABNORMAL LOW (ref 6.5–8.1)

## 2019-04-02 LAB — CK: Total CK: 31 U/L — ABNORMAL LOW (ref 49–397)

## 2019-04-02 LAB — SEDIMENTATION RATE: Sed Rate: 42 mm/hr — ABNORMAL HIGH (ref 0–16)

## 2019-04-02 LAB — PROCALCITONIN: Procalcitonin: <0.1 ng/mL

## 2019-04-02 LAB — LACTIC ACID, PLASMA
Lactic Acid, Venous: 1.9 mmol/L (ref 0.5–1.9)
Lactic Acid, Venous: 1.6 mmol/L (ref 0.5–1.9)

## 2019-04-02 LAB — STREP PNEUMONIAE URINARY ANTIGEN: Strep Pneumo Urinary Antigen: NEGATIVE

## 2019-04-02 MED ORDER — GUAIFENESIN ER 600 MG PO TB12
1200.0000 mg | ORAL_TABLET | Freq: Two times a day (BID) | ORAL | Status: DC
  Administered 2019-04-02 – 2019-04-06 (×9): 1200 mg via ORAL
  Filled 2019-04-02 (×9): qty 2

## 2019-04-02 MED ORDER — HYDRALAZINE HCL 20 MG/ML IJ SOLN: 10.0000 mg | INTRAMUSCULAR | Status: DC | PRN

## 2019-04-02 MED ORDER — METHYLPREDNISOLONE SODIUM SUCC 40 MG IJ SOLR
40.0000 mg | Freq: Four times a day (QID) | INTRAMUSCULAR | Status: AC
  Administered 2019-04-02 – 2019-04-03 (×3): 40 mg via INTRAVENOUS
  Filled 2019-04-02 (×3): qty 1

## 2019-04-02 NOTE — Plan of Care (Signed)
Pt is alert and oriented X4. Able to communicate needs. Reports weakness than usual. Skin warm and dry. Call bell within reach. No acute respiratory issues noted at this time. Will continue monitoring patient closely.

## 2019-04-02 NOTE — Progress Notes (Signed)
PROGRESS NOTE    Kerry Matthews  DEY:[id number] DOB: Oct 30, 1935 DOA: 04/01/2019 PCP: [name], MD    Brief Narrative:  HPI per Dr. Filiberto Pinks Borba is a 83 y.o. male with history of CAD, hypertension, diabetes mellitus presents to the ER because of ongoing fever chills nonproductive cough and shortness of breath for the last 10 days.  Patient was placed on antibiotic which patient has been taking last week and has not found any changes in the fever pattern.  Patient's primary care physician advised to come to the ER.  Patient denies any nausea vomiting diarrhea abdominal pain dysuria or discharges.  Patient states over the last 10 days patient also had increasing lower extremity edema for which patient was placed on Lasix.  ED Course: In the ER patient was febrile with temperature of around 100.9.  Labs showed creatinine 1.7 sodium 132 patient's AST was 75 ALT 67 total bilirubin 1.7 alkaline phosphate 198.  WBC count was 19.4 hemoglobin 10.8 platelets 151.  Since patient had elevated LFTs patient underwent sonogram of the abdomen which shows hepatic steatosis.  Given the shortness of breath productive cough patient underwent CT chest and abdomen which shows nothing acute except for which is concerning for interstitial lung disease.  Patient had blood cultures drawn and started on empiric antibiotics for possible pneumonia given the symptoms and admitted for further observation.  On exam patient has right lower extremity more than the left.  Assessment & Plan:   Principal Problem:   Pneumonia Active Problems:   Hypertension   Hyperlipidemia   CKD (chronic kidney disease)   Elevated LFTs   Diabetes mellitus type 2 in obese ([place])  1 fevers/fatigue/abnormal CT chest with interstitial lung disease noted Questionable etiology.  Source of fever is unknown.  Patient with ongoing fevers for approximately a month per patient and wife.  Patient tested at least 4 times for COVID-19 which  was negative.  Patient has been treated empirically with oral antibiotics of azithromycin and Levaquin over the past 10 days with no significant clinical improvement.  Patient admitted and placed empirically on IV antibiotics for presumed pneumonia.  Patient however denies any significant worsening of shortness of breath.  Patient noted to have a cough with fevers and chills with complaints of fatigue.  Urinalysis done nitrite negative leukocytes negative.  Urine strep pneumococcus antigen is negative.  Chest x-ray done with chronic interstitial disease changes greater on the left and right with no acute abnormalities.  CT abdomen and pelvis with no acute process noted in the chest abdomen or pelvis.  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  Suspicion of mild hepatic steatosis.  Due to ongoing fevers, fatigue, abnormal CT chest with no significant improvement on outpatient antibiotics concern for possible pneumonitis.  Patient currently with low-grade temp.  Patient with a leukocytosis with a left shift.  Lactic acid level has trended down.  Procalcitonin is less than 0.10.  White count now is 17.9 from 19.4.  Check an RSV panel.  Continue current regimen of empiric IV Rocephin and IV azithromycin.  We will add Mucinex and PPI.  Will consult with pulmonary for further evaluation and management.  2.  Lower extremity edema Patient was started on Lasix.  Lower extremity edema seem to have improved on examination this morning.  Lower extremity Dopplers negative for DVT.  2D echo ordered and pending.  Outpatient follow-up.  3.  Diabetes mellitus type 2 Check a hemoglobin A1c.  CBG of 120 this  morning.  Continue sliding scale insulin.  4.  Acute kidney injury Questionable etiology.  Concern for prerenal azotemia in the setting of recent dye uretic use and ARB.  ARB and diuretics on hold.  Renal function improving.  Follow.  5.  Hypertension Pressure stable.  Continue  to hold ARB secondary to acute kidney injury.  If blood pressure control is needed may consider starting patient on Norvasc.  6.  Coronary artery disease Currently stable.  Continue aspirin.  Statin on hold secondary to elevated LFTs.  CT abdomen and pelvis with concerns for hepatic steatosis.  LFTs slowly trending down.  Continue to hold statin for now.  Follow.  7.  Anemia No overt bleeding.  Decreasing hemoglobin could be partly dilutional.  Check an anemia panel.  Follow H&H.    DVT prophylaxis: Lovenox Code Status: Full Family Communication: Updated patient and wife at bedside. Disposition Plan: Pending clinical outcome.  Likely home with home health versus SNF.   Consultants:   Pulmonary pending  Procedures:   CT chest/CT abdomen and pelvis 04/01/2019  Chest x-ray 04/01/2019  Lower extremity Dopplers 04/02/2019  Right upper quadrant ultrasound 04/01/2019  Antimicrobials:   IV Rocephin 04/01/2019  IV azithromycin 04/01/2019   Subjective: Patient sitting up in bed.  Denies any significant change in shortness of breath however noted to have presented with a dry cough, fever and chills.  Patient also with complaints of generalized fatigue.  The patient and wife patient with fever over the past month.  Patient denies any chest pain.  Objective: Vitals:   04/01/19 2022 04/01/19 2111 04/02/19 0111 04/02/19 0611  BP:  (!) 136/58 (!) 101/53 (!) 126/57  Pulse:  (!) 129 78 70  Resp:  (!) 21 18 16   Temp: (!) 100.9 F (38.3 C) 99.3 F (37.4 C) 97.9 F (36.6 C) 98.6 F (37 C)  TempSrc: Rectal Oral Oral Oral  SpO2:  98% 100% 97%  Weight:  82.6 kg  82.2 kg  Height:  5\' 8"  (1.727 m)      Intake/Output Summary (Last 24 hours) at 04/02/2019 1147 Last data filed at 04/02/2019 0900 Gross per 24 hour  Intake 2148.62 ml  Output 1401 ml  Net 747.62 ml   Filed Weights   04/01/19 2111 04/02/19 0611  Weight: 82.6 kg 82.2 kg    Examination:  General exam: Appears calm and comfortable   Respiratory system: Some scattered coarse breath sounds.  No wheezes, no crackles noted.  Speaking in full sentences.  Normal respiratory effort. Cardiovascular system: S1 & S2 heard, RRR. No JVD, murmurs, rubs, gallops or clicks. No pedal edema. Gastrointestinal system: Abdomen is nondistended, soft and nontender. No organomegaly or masses felt. Normal bowel sounds heard. Central nervous system: Alert and oriented. No focal neurological deficits. Extremities: Symmetric 5 x 5 power. Skin: No rashes, lesions or ulcers Psychiatry: Judgement and insight appear normal. Mood & affect appropriate.     Data Reviewed: I have personally reviewed following labs and imaging studies  CBC: Recent Labs  Lab 04/01/19 1236 04/01/19 2309 04/02/19 0308  WBC 17.6* 19.4* 17.9*  NEUTROABS 13.2*  --  14.3*  HGB 11.6* 10.8* 10.4*  HCT 35.3* 32.3* 31.4*  MCV 88.9 85.9 87.5  PLT 554* 451* 175*   Basic Metabolic Panel: Recent Labs  Lab 04/01/19 1236 04/01/19 2309 04/02/19 0308  NA 132*  --  132*  K 4.8  --  4.2  CL 98  --  98  CO2 23  --  23  GLUCOSE 140*  --  129*  BUN 22  --  22  CREATININE 1.49* 1.41* 1.26*  CALCIUM 8.5*  --  7.8*   GFR: Estimated Creatinine Clearance: 48.1 mL/min (A) (by C-G formula based on SCr of 1.26 mg/dL (H)). Liver Function Tests: Recent Labs  Lab 04/01/19 1236 04/02/19 0308  AST 75* 42*  ALT 67* 47*  ALKPHOS 198* 167*  BILITOT 0.7 0.9  PROT 6.3* 5.3*  ALBUMIN 2.3* 1.8*   No results for input(s): LIPASE, AMYLASE in the last 168 hours. No results for input(s): AMMONIA in the last 168 hours. Coagulation Profile: No results for input(s): INR, PROTIME in the last 168 hours. Cardiac Enzymes: No results for input(s): CKTOTAL, CKMB, CKMBINDEX, TROPONINI in the last 168 hours. BNP (last 3 results) No results for input(s): PROBNP in the last 8760 hours. HbA1C: No results for input(s): HGBA1C in the last 72 hours. CBG: Recent Labs  Lab 04/01/19 2241  04/02/19 0611 04/02/19 1132  GLUCAP 224* 120* 153*   Lipid Profile: No results for input(s): CHOL, HDL, LDLCALC, TRIG, CHOLHDL, LDLDIRECT in the last 72 hours. Thyroid Function Tests: No results for input(s): TSH, T4TOTAL, FREET4, T3FREE, THYROIDAB in the last 72 hours. Anemia Panel: No results for input(s): VITAMINB12, FOLATE, FERRITIN, TIBC, IRON, RETICCTPCT in the last 72 hours. Sepsis Labs: Recent Labs  Lab 04/01/19 1610 04/01/19 2114 04/01/19 2309 04/02/19 0022 04/02/19 0308  PROCALCITON  --   --  <0.10  --   --   LATICACIDVEN 1.8 2.5*  --  1.9 1.6    Recent Results (from the past 240 hour(s))  Culture, blood (Routine X 2) w Reflex to ID Panel     Status: None (Preliminary result)   Collection Time: 04/01/19  2:17 PM   Specimen: BLOOD  Result Value Ref Range Status   Specimen Description BLOOD RIGHT ANTECUBITAL  Final   Special Requests   Final    BOTTLES DRAWN AEROBIC AND ANAEROBIC Blood Culture adequate volume   Culture   Final    NO GROWTH < 24 HOURS Performed at [facility], [place] [address], [place], [place] [address]    Report Status PENDING  Incomplete  SARS Coronavirus 2 [facility] order, Performed in [facility] hospital lab) Nasopharyngeal Nasopharyngeal Swab     Status: None   Collection Time: 04/01/19  2:30 PM   Specimen: Nasopharyngeal Swab  Result Value Ref Range Status   SARS Coronavirus 2 NEGATIVE NEGATIVE Final    Comment: (NOTE) If result is NEGATIVE SARS-CoV-2 target nucleic acids are NOT DETECTED. The SARS-CoV-2 RNA is generally detectable in upper and lower  respiratory specimens during the acute phase of infection. The lowest  concentration of SARS-CoV-2 viral copies this assay can detect is 250  copies / mL. A negative result does not preclude SARS-CoV-2 infection  and should not be used as the sole basis for treatment or other  patient management decisions.  A negative result may occur with  improper specimen collection / handling,  submission of specimen other  than nasopharyngeal swab, presence of viral mutation(s) within the  areas targeted by this assay, and inadequate number of viral copies  (<250 copies / mL). A negative result must be combined with clinical  observations, patient history, and epidemiological information. If result is POSITIVE SARS-CoV-2 target nucleic acids are DETECTED. The SARS-CoV-2 RNA is generally detectable in upper and lower  respiratory specimens dur ing the acute phase of infection.  Positive  results are indicative of active  infection with SARS-CoV-2.  Clinical  correlation with patient history and other diagnostic information is  necessary to determine patient infection status.  Positive results do  not rule out bacterial infection or co-infection with other viruses. If result is PRESUMPTIVE POSTIVE SARS-CoV-2 nucleic acids MAY BE PRESENT.   A presumptive positive result was obtained on the submitted specimen  and confirmed on repeat testing.  While 2019 novel coronavirus  (SARS-CoV-2) nucleic acids may be present in the submitted sample  additional confirmatory testing may be necessary for epidemiological  and / or clinical management purposes  to differentiate between  SARS-CoV-2 and other Sarbecovirus currently known to infect humans.  If clinically indicated additional testing with an alternate test  methodology [phone]) is advised. The SARS-CoV-2 RNA is generally  detectable in upper and lower respiratory sp ecimens during the acute  phase of infection. The expected result is Negative. Fact Sheet for Patients:  StrictlyIdeas.no Fact Sheet for Healthcare Providers: BankingDealers.co.za This test is not yet approved or cleared by the [place] FDA and has been authorized for detection and/or diagnosis of SARS-CoV-2 by FDA under an Emergency Use Authorization (EUA).  This EUA will remain in effect (meaning this test can be  used) for the duration of the COVID-19 declaration under Section 564(b)(1) of the Act, 21 U.S.C. section 360bbb-3(b)(1), unless the authorization is terminated or revoked sooner. Performed at [facility], [place] [address] [place], [place] [address]   Culture, blood (Routine X 2) w Reflex to ID Panel     Status: None (Preliminary result)   Collection Time: 04/01/19  2:47 PM   Specimen: BLOOD RIGHT HAND  Result Value Ref Range Status   Specimen Description BLOOD RIGHT HAND  Final   Special Requests   Final    BOTTLES DRAWN AEROBIC AND ANAEROBIC Blood Culture results may not be optimal due to an inadequate volume of blood received in culture bottles   Culture   Final    NO GROWTH < 24 HOURS Performed at [facility], [place] [address], [place], [place] [address]    Report Status PENDING  Incomplete         Radiology Studies: Ct Chest W Contrast  Result Date: 04/01/2019 CLINICAL DATA:  Cough and fevers. Negative COVID-19 tests x2. Elevated liver function tests. Acute respiratory illness. EXAM: CT CHEST, ABDOMEN, AND PELVIS WITH CONTRAST TECHNIQUE: Multidetector CT imaging of the chest, abdomen and pelvis was performed following the standard protocol during bolus administration of intravenous contrast. CONTRAST:  146mL OMNIPAQUE IOHEXOL 300 MG/ML  SOLN COMPARISON:  Chest radiograph earlier today.  No comparison CTs. FINDINGS: CT CHEST FINDINGS Cardiovascular: Aortic atherosclerosis. Tortuous thoracic aorta. Borderline cardiomegaly, without pericardial effusion. Multivessel coronary artery atherosclerosis. Pulmonary artery enlargement, outflow tract 3.3 cm. No central pulmonary embolism, on this non-dedicated study. Mediastinum/Nodes: borderline enlarged right paratracheal node of 1.0 cm on image 27/3. A subcarinal node measures 1.2 cm on 33/3. No hilar adenopathy. Lungs/Pleura: No pleural fluid. Diffuse but slightly basilar predominant subpleural reticulation with traction  bronchiolectasis. This is slightly worse on the left. No areas of consolidation or ground-glass to suggest pneumonia. Musculoskeletal: No acute osseous abnormality. CT ABDOMEN PELVIS FINDINGS Hepatobiliary: Suspicion of mild hepatic steatosis. Normal gallbladder, without biliary ductal dilatation. Pancreas: Fatty atrophy throughout the pancreas. Spleen: Normal in size, without focal abnormality. Adrenals/Urinary Tract: Normal adrenal glands. Mild renal cortical thinning bilaterally. Bilateral renal sinus cysts, but no hydronephrosis. Mild bladder distension with minimal right-sided bladder at the origin of the  right inguinal hernia. Example image 110/3 and coronal image 55. Stomach/Bowel: Normal stomach, without wall thickening. Scattered colonic diverticula. Normal terminal ileum. Normal small bowel. Vascular/Lymphatic: Aortic and branch vessel atherosclerosis. No abdominopelvic adenopathy. Reproductive: Normal prostate. Other: No significant free fluid. Right inguinal hernia contains fat and minimal urinary bladder. Musculoskeletal: Mild osteopenia. Convex right lumbar spine curvature. Lumbar spondylosis. IMPRESSION: 1. No acute process in the chest, abdomen, or pelvis. No acute explanation for patient's symptoms. 2. Interstitial lung disease, suspicious for early/mild usual interstitial pneumonitis (typically due to pulmonary fibrosis). Differential considerations include nonspecific interstitial pneumonitis. Depending on ongoing chronic clinical symptomatology, consider high-resolution chest CT at 6-12 months. 3. Suspicion of mild hepatic steatosis. 4. Coronary artery atherosclerosis. Aortic Atherosclerosis (ICD10-I70.0). 5. Pulmonary artery enlargement suggests pulmonary arterial hypertension. 6. Right inguinal hernia containing fat and minimal urinary bladder at its origin. 7. Borderline thoracic adenopathy, favored to be reactive. Electronically Signed   By: Abigail Miyamoto M.D.   On: 04/01/2019 19:06   Ct  Abdomen Pelvis W Contrast  Result Date: 04/01/2019 CLINICAL DATA:  Cough and fevers. Negative COVID-19 tests x2. Elevated liver function tests. Acute respiratory illness. EXAM: CT CHEST, ABDOMEN, AND PELVIS WITH CONTRAST TECHNIQUE: Multidetector CT imaging of the chest, abdomen and pelvis was performed following the standard protocol during bolus administration of intravenous contrast. CONTRAST:  132mL OMNIPAQUE IOHEXOL 300 MG/ML  SOLN COMPARISON:  Chest radiograph earlier today.  No comparison CTs. FINDINGS: CT CHEST FINDINGS Cardiovascular: Aortic atherosclerosis. Tortuous thoracic aorta. Borderline cardiomegaly, without pericardial effusion. Multivessel coronary artery atherosclerosis. Pulmonary artery enlargement, outflow tract 3.3 cm. No central pulmonary embolism, on this non-dedicated study. Mediastinum/Nodes: borderline enlarged right paratracheal node of 1.0 cm on image 27/3. A subcarinal node measures 1.2 cm on 33/3. No hilar adenopathy. Lungs/Pleura: No pleural fluid. Diffuse but slightly basilar predominant subpleural reticulation with traction bronchiolectasis. This is slightly worse on the left. No areas of consolidation or ground-glass to suggest pneumonia. Musculoskeletal: No acute osseous abnormality. CT ABDOMEN PELVIS FINDINGS Hepatobiliary: Suspicion of mild hepatic steatosis. Normal gallbladder, without biliary ductal dilatation. Pancreas: Fatty atrophy throughout the pancreas. Spleen: Normal in size, without focal abnormality. Adrenals/Urinary Tract: Normal adrenal glands. Mild renal cortical thinning bilaterally. Bilateral renal sinus cysts, but no hydronephrosis. Mild bladder distension with minimal right-sided bladder at the origin of the right inguinal hernia. Example image 110/3 and coronal image 55. Stomach/Bowel: Normal stomach, without wall thickening. Scattered colonic diverticula. Normal terminal ileum. Normal small bowel. Vascular/Lymphatic: Aortic and branch vessel atherosclerosis.  No abdominopelvic adenopathy. Reproductive: Normal prostate. Other: No significant free fluid. Right inguinal hernia contains fat and minimal urinary bladder. Musculoskeletal: Mild osteopenia. Convex right lumbar spine curvature. Lumbar spondylosis. IMPRESSION: 1. No acute process in the chest, abdomen, or pelvis. No acute explanation for patient's symptoms. 2. Interstitial lung disease, suspicious for early/mild usual interstitial pneumonitis (typically due to pulmonary fibrosis). Differential considerations include nonspecific interstitial pneumonitis. Depending on ongoing chronic clinical symptomatology, consider high-resolution chest CT at 6-12 months. 3. Suspicion of mild hepatic steatosis. 4. Coronary artery atherosclerosis. Aortic Atherosclerosis (ICD10-I70.0). 5. Pulmonary artery enlargement suggests pulmonary arterial hypertension. 6. Right inguinal hernia containing fat and minimal urinary bladder at its origin. 7. Borderline thoracic adenopathy, favored to be reactive. Electronically Signed   By: Abigail Miyamoto M.D.   On: 04/01/2019 19:06   Dg Chest Port 1 View  Result Date: 04/01/2019 CLINICAL DATA:  Cough, shortness of breath and weakness for 3-4 months, COVID-19 like symptoms but 2 negative COVID-19 tests EXAM: PORTABLE CHEST 1  VIEW COMPARISON:  Portable exam 1527 hours compared to 03/25/2019 FINDINGS: Normal heart size, mediastinal contours, and pulmonary vascularity. Chronic interstitial lung disease at the periphery of the mid to lower lungs bilaterally greater on LEFT. No definite acute infiltrate, pleural effusion or pneumothorax. Bones demineralized. IMPRESSION: Chronic interstitial disease changes greater on LEFT than RIGHT. No acute abnormalities. Electronically Signed   By: Lavonia Dana M.D.   On: 04/01/2019 15:48   Vas [place] Lower Extremity Venous (dvt)  Result Date: 04/02/2019  Lower Venous Study Indications: Edema.  Comparison Study: no prior Performing Technologist: Abram Sander RVS   Examination Guidelines: A complete evaluation includes B-mode imaging, spectral Doppler, color Doppler, and power Doppler as needed of all accessible portions of each vessel. Bilateral testing is considered an integral part of a complete examination. Limited examinations for reoccurring indications may be performed as noted.  +---------+---------------+---------+-----------+----------+-------+  RIGHT     Compressibility Phasicity Spontaneity Properties Summary  +---------+---------------+---------+-----------+----------+-------+  CFV       Full            Yes       Yes                             +---------+---------------+---------+-----------+----------+-------+  SFJ       Full                                                      +---------+---------------+---------+-----------+----------+-------+  FV Prox   Full                                                      +---------+---------------+---------+-----------+----------+-------+  FV Mid    Full                                                      +---------+---------------+---------+-----------+----------+-------+  FV Distal Full                                                      +---------+---------------+---------+-----------+----------+-------+  PFV       Full                                                      +---------+---------------+---------+-----------+----------+-------+  POP       Full            Yes       Yes                             +---------+---------------+---------+-----------+----------+-------+  PTV       Full                                                      +---------+---------------+---------+-----------+----------+-------+  PERO      Full                                                      +---------+---------------+---------+-----------+----------+-------+   +---------+---------------+---------+-----------+----------+-------+  LEFT      Compressibility Phasicity Spontaneity Properties Summary   +---------+---------------+---------+-----------+----------+-------+  CFV       Full            Yes       Yes                             +---------+---------------+---------+-----------+----------+-------+  SFJ       Full                                                      +---------+---------------+---------+-----------+----------+-------+  FV Prox   Full                                                      +---------+---------------+---------+-----------+----------+-------+  FV Mid    Full                                                      +---------+---------------+---------+-----------+----------+-------+  FV Distal Full                                                      +---------+---------------+---------+-----------+----------+-------+  PFV       Full                                                      +---------+---------------+---------+-----------+----------+-------+  POP       Full            Yes       Yes                             +---------+---------------+---------+-----------+----------+-------+  PTV       Full                                                      +---------+---------------+---------+-----------+----------+-------+  PERO      Full                                                      +---------+---------------+---------+-----------+----------+-------+  Summary: Right: There is no evidence of deep vein thrombosis in the lower extremity. No cystic structure found in the popliteal fossa. Left: There is no evidence of deep vein thrombosis in the lower extremity. No cystic structure found in the popliteal fossa.  *See table(s) above for measurements and observations.    Preliminary    US Abdomen Limited Ruq  Result Date: 04/01/2019 CLINICAL DATA:  Elevated LFTs EXAM: ULTRASOUND ABDOMEN LIMITED RIGHT UPPER QUADRANT COMPARISON:  None. FINDINGS: Gallbladder: No gallstones or wall thickening visualized. No sonographic Murphy sign noted by sonographer. Common bile duct: Diameter:  5.3 mm Liver: Liver is echogenic. No focal hepatic abnormality. Portal vein is patent on color Doppler imaging with normal direction of blood flow towards the liver. Other: None. IMPRESSION: 1. Negative for gallstones or biliary dilatation 2. Echogenic liver consistent with steatosis and or hepatocellular disease. Electronically Signed   By: Donavan Foil M.D.   On: 04/01/2019 16:57        Scheduled Meds:  aspirin  324 mg Oral Daily   enoxaparin (LOVENOX) injection  40 mg Subcutaneous Q24H   insulin aspart  0-9 Units Subcutaneous TID WC   omega-3 acid ethyl esters  2 g Oral Daily   pantoprazole  40 mg Oral Daily   sodium chloride flush  3 mL Intravenous Once   traZODone  100 mg Oral QHS   Continuous Infusions:  azithromycin     cefTRIAXone (ROCEPHIN)  IV       LOS: 0 days    Time spent: 35 minutes    Irine Seal, MD [facility] Hospitalists  If 7PM-7AM, please contact night-coverage [email or web address] 04/02/2019, 11:47 AM

## 2019-04-02 NOTE — Progress Notes (Signed)
   Vital Signs MEWS/VS Documentation      04/01/2019 2130 04/02/2019 0111 04/02/2019 0611 04/02/2019 0700   MEWS Score:  3  0  0  0   MEWS Score Color:  Yellow  Green  Green  Green   Resp:  -  18  16  -   Pulse:  -  78  70  -   BP:  -  (!) 101/53  (!) 126/57  -   Temp:  -  97.9 F (36.6 C)  98.6 F (37 C)  -   O2 Device:  -  Room Air  Room Air  -   Level of Consciousness:  Alert  -  -  Kerry Matthews 04/02/2019,2:13 PM

## 2019-04-02 NOTE — Progress Notes (Signed)
Lower extremity venous has been completed.   Preliminary results in CV Proc.   Abram Sander 04/02/2019 9:31 AM

## 2019-04-02 NOTE — Consult Note (Signed)
NAME:  Kerry Matthews, MRN:  [id number], DOB:  [birth year], LOS: 0 ADMISSION DATE:  04/01/2019, CONSULTATION DATE:  04/02/19 REFERRING MD:  Nile Riggs [place] Silos, CHIEF COMPLAINT:  Diffuse pain and fever   Brief History   83 year old diabetic male presenting to PCCM with diffuse muscle pain fever that started over a month ago.  Patient has been tried on 2 courses of zithromax and levaquin without success.  Fever occurs between 1 and 4 PM on a daily bases.  No other associated symptoms.  Improves with tylenol and worsens without.  Mild cough that is none productive.  COVID test negative x3.  No sick contacts.  History of present illness   83 year old diabetic male presenting to PCCM with diffuse muscle pain fever that started over a month ago.  Patient has been tried on 2 courses of zithromax and levaquin without success.  Fever occurs between 1 and 4 PM on a daily bases.  No other associated symptoms.  Improves with tylenol and worsens without.  Mild cough that is none productive.  COVID test negative x3.  No sick contacts.  Past Medical History  DM and HTN Alpha-1-antitrypsin deficiency  Significant Hospital Events   Persistent fever  Consults:  PCCM  Procedures:  None  Significant Diagnostic Tests:  CT of the chest that I reviewed myself that showed evidence of very mild pneumonitis  Micro Data:  Blood cultures 8/7>>>NTD Procal <0.10  Antimicrobials:  Rocephin 8/7>>>8/8 Zithromax 8/7>>> 8/8 Previously finished a full course of zithromax and levaquin was completed 2 days  Interim history/subjective:  Fever and muscle aches  Objective   Blood pressure (!) 126/57, pulse 70, temperature 98.6 F (37 C), temperature source Oral, resp. rate 16, height 5\' 8"  (1.727 m), weight 82.2 kg, SpO2 97 %.        Intake/Output Summary (Last 24 hours) at 04/02/2019 1520 Last data filed at 04/02/2019 0900 Gross per 24 hour  Intake 2148.62 ml  Output 1401 ml  Net 747.62 ml   Filed Weights   04/01/19 2111 04/02/19 0611  Weight: 82.6 kg 82.2 kg   Examination: General: Chronically ill appearing elderly gentleman, NAD HENT: [place]/AT, PERRL, EOM-I and MMM Lungs: CTA bilaterally Cardiovascular: RRR, Nl S1/S2 and -M/R/G Abdomen: Soft, NT, ND and +BS Extremities: -edema and -tenderness Neuro: Alert and interactive, moving all ext to command Skin: Intact  Resolved Hospital Problem list   N/A  Assessment & Plan:  83 year old male with DM presenting with 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  Discussed with TRH-MD.  Autoimmune panel - Anti-DS DNA ab - CRP - Anti-Jo-1 - CPK - cANCA - pANCA - SSA and SSB - Aldolase - MPO - PR3  CAP: - D/C abx  Pneumonitis: - Solumedrol 40 q6  Steroids side effects: - Watch CBGs - Patient and wife warned about steroids psychosis  PCCM will continue to follow  Labs   CBC: Recent Labs  Lab 04/01/19 1236 04/01/19 2309 04/02/19 0308  WBC 17.6* 19.4* 17.9*  NEUTROABS 13.2*  --  14.3*  HGB 11.6* 10.8* 10.4*  HCT 35.3* 32.3* 31.4*  MCV 88.9 85.9 87.5  PLT 554* 451* 440*    Basic Metabolic Panel: Recent Labs  Lab 04/01/19 1236 04/01/19 2309 04/02/19 0308  NA 132*  --  132*  K 4.8  --  4.2  CL 98  --  98  CO2 23  --  23  GLUCOSE 140*  --  129*  BUN 22  --  22  CREATININE 1.49* 1.41* 1.26*  CALCIUM 8.5*  --  7.8*   GFR: Estimated Creatinine Clearance: 48.1 mL/min (A) (by C-G formula based on SCr of 1.26 mg/dL (H)). Recent Labs  Lab 04/01/19 1236 04/01/19 1610 04/01/19 2114 04/01/19 2309 04/02/19 0022 04/02/19 0308  PROCALCITON  --   --   --  <0.10  --   --   WBC 17.6*  --   --  19.4*  --  17.9*  LATICACIDVEN  --  1.8 2.5*  --  1.9 1.6    Liver Function Tests: Recent Labs  Lab 04/01/19 1236 04/02/19 0308  AST 75* 42*  ALT 67* 47*  ALKPHOS 198* 167*  BILITOT 0.7 0.9  PROT 6.3* 5.3*  ALBUMIN 2.3* 1.8*   No results for input(s): LIPASE, AMYLASE in the last 168 hours. No results for  input(s): AMMONIA in the last 168 hours.  ABG No results found for: PHART, PCO2ART, PO2ART, HCO3, TCO2, ACIDBASEDEF, O2SAT   Coagulation Profile: No results for input(s): INR, PROTIME in the last 168 hours.  Cardiac Enzymes: No results for input(s): CKTOTAL, CKMB, CKMBINDEX, TROPONINI in the last 168 hours.  HbA1C: No results found for: HGBA1C  CBG: Recent Labs  Lab 04/01/19 2241 04/02/19 0611 04/02/19 1132  GLUCAP 224* 120* 153*    Review of Systems:   12 point ROS negative other than above  Past Medical History  He,  has a past medical history of Coronary artery disease, Diabetes ([place]), Hyperlipidemia, Hypertension, Osteoarthritis, Renal stones, and Skin cancer.   Surgical History    Past Surgical History:  Procedure Laterality Date  . BACK SURGERY    . CATARACT EXTRACTION    . TRIGGER FINGER RELEASE       Social History   reports that he has never smoked. He has never used smokeless tobacco.   Family History   His family history includes Clotting disorder in his brother; Heart attack in his sister; Lung disease in his sister; Pancreatic cancer in his sister; Stroke in his father; Tuberculosis in his mother.   Allergies Allergies  Allergen Reactions  . Ace Inhibitors Other (See Comments)    Slow heart rate  . Hydrocodone Nausea And Vomiting  . Hydrocodone-Acetaminophen Nausea And Vomiting  . Sulfamethoxazole-Trimethoprim     Got sicker     Home Medications  Prior to Admission medications   Medication Sig Start Date End Date Taking? Authorizing Provider  aspirin 81 MG EC tablet Take 324 mg by mouth daily.    Yes [provider]  atorvastatin (LIPITOR) 40 MG tablet Take 40 mg by mouth daily at 6 PM.    Yes [provider]  Coenzyme Q10 (CO Q 10) 10 MG CAPS Take 1 capsule by mouth daily.   Yes [provider]  furosemide (LASIX) 40 MG tablet Take 40 mg by mouth daily.    Yes [provider]  losartan (COZAAR) 50 MG  tablet Take 1 tablet by mouth 2 (two) times daily. 12/06/18  Yes [provider]  metFORMIN (GLUCOPHAGE) 500 MG tablet Take 500 mg by mouth daily with breakfast.    Yes [provider]  Multiple Vitamins-Minerals (PRESERVISION AREDS 2+MULTI VIT) CAPS Take 1 capsule by mouth 2 (two) times daily.   Yes [provider]  nitroGLYCERIN (NITROSTAT) 0.4 MG SL tablet Place 0.4 mg under the tongue every 5 (five) minutes as needed.   Yes [provider]  Omega-3 Fatty Acids (FISH OIL) 1000 MG CAPS Take 1,000  mg by mouth 2 (two) times daily.    Yes [provider]  omeprazole (PRILOSEC) 20 MG capsule Take 20 mg by mouth daily.   Yes [provider]  traZODone (DESYREL) 50 MG tablet Take 2 tablets by mouth at bedtime.   Yes [provider]  azithromycin (ZITHROMAX) 250 MG tablet Take 250 mg by mouth daily. 03/31/19   [provider]  levofloxacin (LEVAQUIN) 750 MG tablet Take 750 mg by mouth daily. 03/22/19   [provider]    Rush Farmer, M.D. [facility] Pulmonary/Critical Care Medicine. Pager: [phone]. After hours pager: [phone].

## 2019-04-03 DIAGNOSIS — I351 Nonrheumatic aortic (valve) insufficiency: Secondary | ICD-10-CM | POA: Diagnosis not present

## 2019-04-03 DIAGNOSIS — J189 Pneumonia, unspecified organism: Secondary | ICD-10-CM | POA: Diagnosis present

## 2019-04-03 DIAGNOSIS — R509 Fever, unspecified: Secondary | ICD-10-CM | POA: Diagnosis not present

## 2019-04-03 DIAGNOSIS — Z955 Presence of coronary angioplasty implant and graft: Secondary | ICD-10-CM | POA: Diagnosis not present

## 2019-04-03 DIAGNOSIS — K760 Fatty (change of) liver, not elsewhere classified: Secondary | ICD-10-CM | POA: Diagnosis present

## 2019-04-03 DIAGNOSIS — J849 Interstitial pulmonary disease, unspecified: Secondary | ICD-10-CM

## 2019-04-03 DIAGNOSIS — E669 Obesity, unspecified: Secondary | ICD-10-CM | POA: Diagnosis present

## 2019-04-03 DIAGNOSIS — M1990 Unspecified osteoarthritis, unspecified site: Secondary | ICD-10-CM | POA: Diagnosis present

## 2019-04-03 DIAGNOSIS — I340 Nonrheumatic mitral (valve) insufficiency: Secondary | ICD-10-CM | POA: Diagnosis not present

## 2019-04-03 DIAGNOSIS — E8801 Alpha-1-antitrypsin deficiency: Secondary | ICD-10-CM | POA: Diagnosis present

## 2019-04-03 DIAGNOSIS — E538 Deficiency of other specified B group vitamins: Secondary | ICD-10-CM

## 2019-04-03 DIAGNOSIS — R7989 Other specified abnormal findings of blood chemistry: Secondary | ICD-10-CM | POA: Diagnosis present

## 2019-04-03 DIAGNOSIS — R05 Cough: Secondary | ICD-10-CM | POA: Diagnosis present

## 2019-04-03 DIAGNOSIS — E1122 Type 2 diabetes mellitus with diabetic chronic kidney disease: Secondary | ICD-10-CM | POA: Diagnosis present

## 2019-04-03 DIAGNOSIS — D631 Anemia in chronic kidney disease: Secondary | ICD-10-CM | POA: Diagnosis present

## 2019-04-03 DIAGNOSIS — N182 Chronic kidney disease, stage 2 (mild): Secondary | ICD-10-CM | POA: Diagnosis present

## 2019-04-03 DIAGNOSIS — R945 Abnormal results of liver function studies: Secondary | ICD-10-CM | POA: Diagnosis not present

## 2019-04-03 DIAGNOSIS — I2721 Secondary pulmonary arterial hypertension: Secondary | ICD-10-CM | POA: Diagnosis present

## 2019-04-03 DIAGNOSIS — R600 Localized edema: Secondary | ICD-10-CM | POA: Diagnosis present

## 2019-04-03 DIAGNOSIS — R0902 Hypoxemia: Secondary | ICD-10-CM | POA: Diagnosis present

## 2019-04-03 DIAGNOSIS — M3320 Polymyositis, organ involvement unspecified: Secondary | ICD-10-CM | POA: Diagnosis present

## 2019-04-03 DIAGNOSIS — J439 Emphysema, unspecified: Secondary | ICD-10-CM | POA: Diagnosis present

## 2019-04-03 DIAGNOSIS — D649 Anemia, unspecified: Secondary | ICD-10-CM | POA: Diagnosis not present

## 2019-04-03 DIAGNOSIS — Z20828 Contact with and (suspected) exposure to other viral communicable diseases: Secondary | ICD-10-CM | POA: Diagnosis present

## 2019-04-03 DIAGNOSIS — K523 Indeterminate colitis: Secondary | ICD-10-CM | POA: Diagnosis present

## 2019-04-03 DIAGNOSIS — N179 Acute kidney failure, unspecified: Secondary | ICD-10-CM | POA: Diagnosis present

## 2019-04-03 DIAGNOSIS — Z6828 Body mass index (BMI) 28.0-28.9, adult: Secondary | ICD-10-CM | POA: Diagnosis not present

## 2019-04-03 DIAGNOSIS — M609 Myositis, unspecified: Secondary | ICD-10-CM

## 2019-04-03 DIAGNOSIS — I129 Hypertensive chronic kidney disease with stage 1 through stage 4 chronic kidney disease, or unspecified chronic kidney disease: Secondary | ICD-10-CM | POA: Diagnosis present

## 2019-04-03 DIAGNOSIS — J8489 Other specified interstitial pulmonary diseases: Secondary | ICD-10-CM | POA: Diagnosis present

## 2019-04-03 DIAGNOSIS — K219 Gastro-esophageal reflux disease without esophagitis: Secondary | ICD-10-CM | POA: Diagnosis present

## 2019-04-03 DIAGNOSIS — R9389 Abnormal findings on diagnostic imaging of other specified body structures: Secondary | ICD-10-CM | POA: Diagnosis not present

## 2019-04-03 DIAGNOSIS — D689 Coagulation defect, unspecified: Secondary | ICD-10-CM | POA: Diagnosis present

## 2019-04-03 DIAGNOSIS — I2510 Atherosclerotic heart disease of native coronary artery without angina pectoris: Secondary | ICD-10-CM | POA: Diagnosis present

## 2019-04-03 HISTORY — DX: Deficiency of other specified B group vitamins: E53.8

## 2019-04-03 HISTORY — DX: Other specified abnormal findings of blood chemistry: R79.89

## 2019-04-03 LAB — LEGIONELLA PNEUMOPHILA SEROGP 1 UR AG: L. pneumophila Serogp 1 Ur Ag: NEGATIVE

## 2019-04-03 LAB — IRON AND TIBC
Iron: 27 ug/dL — ABNORMAL LOW (ref 45–182)
Saturation Ratios: 16 % — ABNORMAL LOW (ref 17.9–39.5)
TIBC: 164 ug/dL — ABNORMAL LOW (ref 250–450)
UIBC: 137 ug/dL

## 2019-04-03 LAB — FERRITIN: Ferritin: 429 ng/mL — ABNORMAL HIGH (ref 24–336)

## 2019-04-03 LAB — HEMOGLOBIN A1C
Hgb A1c MFr Bld: 6.2 % — ABNORMAL HIGH (ref 4.8–5.6)
Mean Plasma Glucose: 131.24 mg/dL

## 2019-04-03 LAB — BASIC METABOLIC PANEL
Anion gap: 11 (ref 5–15)
BUN: 25 mg/dL — ABNORMAL HIGH (ref 8–23)
CO2: 21 mmol/L — ABNORMAL LOW (ref 22–32)
Calcium: 8.4 mg/dL — ABNORMAL LOW (ref 8.9–10.3)
Chloride: 99 mmol/L (ref 98–111)
Creatinine, Ser: 1.04 mg/dL (ref 0.61–1.24)
GFR calc Af Amer: >60 mL/min (ref >60)
GFR calc non Af Amer: >60 mL/min (ref >60)
Glucose, Bld: 272 mg/dL — ABNORMAL HIGH (ref 70–99)
Potassium: 4.6 mmol/L (ref 3.5–5.1)
Sodium: 131 mmol/L — ABNORMAL LOW (ref 135–145)

## 2019-04-03 LAB — CBC WITH DIFFERENTIAL/PLATELET
Abs Immature Granulocytes: 0.3 10*3/uL — ABNORMAL HIGH (ref 0.00–0.07)
Basophils Absolute: 0 10*3/uL (ref 0.0–0.1)
Basophils Relative: 0 %
Eosinophils Absolute: 0 10*3/uL (ref 0.0–0.5)
Eosinophils Relative: 0 %
HCT: 34.2 % — ABNORMAL LOW (ref 39.0–52.0)
Hemoglobin: 11.2 g/dL — ABNORMAL LOW (ref 13.0–17.0)
Immature Granulocytes: 2 %
Lymphocytes Relative: 7 %
Lymphs Abs: 1.1 10*3/uL (ref 0.7–4.0)
MCH: 28.3 pg (ref 26.0–34.0)
MCHC: 32.7 g/dL (ref 30.0–36.0)
MCV: 86.4 fL (ref 80.0–100.0)
Monocytes Absolute: 0.3 10*3/uL (ref 0.1–1.0)
Monocytes Relative: 2 %
Neutro Abs: 14.4 10*3/uL — ABNORMAL HIGH (ref 1.7–7.7)
Neutrophils Relative %: 89 %
Platelets: 436 10*3/uL — ABNORMAL HIGH (ref 150–400)
RBC: 3.96 MIL/uL — ABNORMAL LOW (ref 4.22–5.81)
RDW: 12.8 % (ref 11.5–15.5)
WBC: 16.2 10*3/uL — ABNORMAL HIGH (ref 4.0–10.5)
nRBC: 0 % (ref 0.0–0.2)

## 2019-04-03 LAB — CYCLIC CITRUL PEPTIDE ANTIBODY, IGG/IGA: CCP Antibodies IgG/IgA: 6 units (ref 0–19)

## 2019-04-03 LAB — GLUCOSE, CAPILLARY
Glucose-Capillary: 214 mg/dL — ABNORMAL HIGH (ref 70–99)
Glucose-Capillary: 282 mg/dL — ABNORMAL HIGH (ref 70–99)
Glucose-Capillary: 241 mg/dL — ABNORMAL HIGH (ref 70–99)
Glucose-Capillary: 219 mg/dL — ABNORMAL HIGH (ref 70–99)

## 2019-04-03 LAB — VITAMIN B12: Vitamin B-12: 341 pg/mL (ref 180–914)

## 2019-04-03 LAB — FOLATE: Folate: 15.4 ng/mL (ref >5.9)

## 2019-04-03 MED ORDER — METHYLPREDNISOLONE SODIUM SUCC 40 MG IJ SOLR
40.0000 mg | Freq: Four times a day (QID) | INTRAMUSCULAR | Status: AC
  Administered 2019-04-03 – 2019-04-05 (×9): 40 mg via INTRAVENOUS
  Filled 2019-04-03 (×10): qty 1

## 2019-04-03 MED ORDER — INSULIN ASPART 100 UNIT/ML ~~LOC~~ SOLN
0.0000 [IU] | Freq: Three times a day (TID) | SUBCUTANEOUS | Status: DC
  Administered 2019-04-03: 11 [IU] via SUBCUTANEOUS
  Administered 2019-04-03 – 2019-04-04 (×3): 7 [IU] via SUBCUTANEOUS
  Administered 2019-04-04: 11 [IU] via SUBCUTANEOUS
  Administered 2019-04-05: 7 [IU] via SUBCUTANEOUS
  Administered 2019-04-05: 15 [IU] via SUBCUTANEOUS
  Administered 2019-04-05: 20 [IU] via SUBCUTANEOUS
  Administered 2019-04-06 (×2): 7 [IU] via SUBCUTANEOUS
  Administered 2019-04-06: 3 [IU] via SUBCUTANEOUS

## 2019-04-03 MED ORDER — CYANOCOBALAMIN 1000 MCG/ML IJ SOLN
1000.0000 ug | Freq: Every day | INTRAMUSCULAR | Status: DC
  Administered 2019-04-03 – 2019-04-06 (×4): 1000 ug via SUBCUTANEOUS
  Filled 2019-04-03 (×5): qty 1

## 2019-04-03 MED ORDER — INSULIN GLARGINE 100 UNIT/ML ~~LOC~~ SOLN
10.0000 [IU] | Freq: Every day | SUBCUTANEOUS | Status: DC
  Administered 2019-04-03 – 2019-04-04 (×2): 10 [IU] via SUBCUTANEOUS
  Filled 2019-04-03 (×2): qty 0.1

## 2019-04-03 NOTE — Progress Notes (Signed)
NAME:  Kerry Matthews, MRN:  [id number], DOB:  29-Aug-1935, LOS: 0 ADMISSION DATE:  04/01/2019, CONSULTATION DATE:  04/02/19 REFERRING MD:  Nile Riggs [place] Silos, CHIEF COMPLAINT:  Diffuse pain and fever   Brief History   83 year old diabetic male presenting to PCCM with diffuse muscle pain fever that started over a month ago.  Patient has been tried on 2 courses of zithromax and levaquin without success.  Fever occurs between 1 and 4 PM on a daily bases.  No other associated symptoms.  Improves with tylenol and worsens without.  Mild cough that is none productive.  COVID test negative x3.  No sick contacts.  History of present illness   83 year old diabetic male presenting to PCCM with diffuse muscle pain fever that started over a month ago.  Patient has been tried on 2 courses of zithromax and levaquin without success.  Fever occurs between 1 and 4 PM on a daily bases.  No other associated symptoms.  Improves with tylenol and worsens without.  Mild cough that is none productive.  COVID test negative x3.  No sick contacts.  Past Medical History  DM and HTN Alpha-1-antitrypsin deficiency  Significant Hospital Events   Persistent fever  Consults:  PCCM  Procedures:  None  Significant Diagnostic Tests:  CT of the chest that I reviewed myself that showed evidence of very mild pneumonitis  Micro Data:  Blood cultures 8/7>>>NTD Procal <0.10  Antimicrobials:  Rocephin 8/7>>>8/8 Zithromax 8/7>>> 8/8 Previously finished a full course of zithromax and levaquin was completed 2 days  Interim history/subjective:  Feels much better this   Objective   Blood pressure 125/80, pulse 92, temperature (!) 97.4 F (36.3 C), temperature source Oral, resp. rate 16, height 5\' 8"  (1.727 m), weight 82.2 kg, SpO2 99 %.        Intake/Output Summary (Last 24 hours) at 04/03/2019 1432 Last data filed at 04/03/2019 0843 Gross per 24 hour  Intake 720 ml  Output -  Net 720 ml   Filed Weights   04/01/19 2111  04/02/19 0611 04/03/19 0550  Weight: 82.6 kg 82.2 kg 82.2 kg   Examination: General: Chronically ill appearing male, NAD, appears comfortable HENT: [place]/AT, PERRL, EOM-I and MMM Lungs: CTA bilaterally Cardiovascular: RRR, Nl S1/S2 Abdomen: Soft, NT, ND and +BS Extremities: -edema and -tenderness Neuro: Alert and interactive, moving all ext to command Skin: Intact  I reviewed CXR myself, no acute disease noted  Resolved Hospital Problem list   N/A  Assessment & Plan:  83 year old male with DM presenting with 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  Discussed with TRH-MD.  Autoimmune panel - Anti-DS DNA ab pending - CRP pending - Anti-Jo-1 pending - CPK pending - cANCA pending - pANCA pending - SSA and SSB pending - Aldolase pending - MPO pending - PR3 pending  CAP: - Monitor off abx  Pneumonitis: - Solumedrol 40 q6 x3 days then change to prednisone 30 mg PO then drop to 20 mg daily until seen by pulmonary and rheumatology.  Steroids side effects: - Watch CBGs - Patient and wife warned about steroids psychosis and hyperglycemia  PCCM will continue to follow  Labs   CBC: Recent Labs  Lab 04/01/19 1236 04/01/19 2309 04/02/19 0308 04/03/19 0544  WBC 17.6* 19.4* 17.9* 16.2*  NEUTROABS 13.2*  --  14.3* 14.4*  HGB 11.6* 10.8* 10.4* 11.2*  HCT 35.3* 32.3* 31.4* 34.2*  MCV 88.9 85.9 87.5 86.4  PLT 554* 451* 440* 436*  Basic Metabolic Panel: Recent Labs  Lab 04/01/19 1236 04/01/19 2309 04/02/19 0308 04/03/19 0544  NA 132*  --  132* 131*  K 4.8  --  4.2 4.6  CL 98  --  98 99  CO2 23  --  23 21*  GLUCOSE 140*  --  129* 272*  BUN 22  --  22 25*  CREATININE 1.49* 1.41* 1.26* 1.04  CALCIUM 8.5*  --  7.8* 8.4*   GFR: Estimated Creatinine Clearance: 58.2 mL/min (by C-G formula based on SCr of 1.04 mg/dL). Recent Labs  Lab 04/01/19 1236 04/01/19 1610 04/01/19 2114 04/01/19 2309 04/02/19 0022 04/02/19 0308 04/03/19 0544  PROCALCITON   --   --   --  <0.10  --   --   --   WBC 17.6*  --   --  19.4*  --  17.9* 16.2*  LATICACIDVEN  --  1.8 2.5*  --  1.9 1.6  --     Liver Function Tests: Recent Labs  Lab 04/01/19 1236 04/02/19 0308  AST 75* 42*  ALT 67* 47*  ALKPHOS 198* 167*  BILITOT 0.7 0.9  PROT 6.3* 5.3*  ALBUMIN 2.3* 1.8*   No results for input(s): LIPASE, AMYLASE in the last 168 hours. No results for input(s): AMMONIA in the last 168 hours.  ABG No results found for: PHART, PCO2ART, PO2ART, HCO3, TCO2, ACIDBASEDEF, O2SAT   Coagulation Profile: No results for input(s): INR, PROTIME in the last 168 hours.  Cardiac Enzymes: Recent Labs  Lab 04/02/19 1617  CKTOTAL 31*    HbA1C: Hgb A1c MFr Bld  Date/Time Value Ref Range Status  04/03/2019 05:44 AM 6.2 (H) 4.8 - 5.6 % Final    Comment:    (NOTE) Pre diabetes:          5.7%-6.4% Diabetes:              >6.4% Glycemic control for   <7.0% adults with diabetes     CBG: Recent Labs  Lab 04/02/19 1132 04/02/19 1650 04/02/19 2115 04/03/19 0619 04/03/19 1214  GLUCAP 153* 133* 252* 214* 282*   Rush Farmer, M.D. [facility] Pulmonary/Critical Care Medicine. Pager: [phone]. After hours pager: [phone].

## 2019-04-03 NOTE — Progress Notes (Addendum)
PROGRESS NOTE    Kerry Matthews  JSE:[id number] DOB: 1936/05/20 DOA: 04/01/2019 PCP: [name], MD    Brief Narrative:  HPI per Dr. Filiberto Pinks Kerry Matthews is a 83 y.o. male with history of CAD, hypertension, diabetes mellitus presents to the ER because of ongoing fever chills nonproductive cough and shortness of breath for the last 10 days.  Patient was placed on antibiotic which patient has been taking last week and has not found any changes in the fever pattern.  Patient's primary care physician advised to come to the ER.  Patient denies any nausea vomiting diarrhea abdominal pain dysuria or discharges.  Patient states over the last 10 days patient also had increasing lower extremity edema for which patient was placed on Lasix.  ED Course: In the ER patient was febrile with temperature of around 100.9.  Labs showed creatinine 1.7 sodium 132 patient's AST was 75 ALT 67 total bilirubin 1.7 alkaline phosphate 198.  WBC count was 19.4 hemoglobin 10.8 platelets 151.  Since patient had elevated LFTs patient underwent sonogram of the abdomen which shows hepatic steatosis.  Given the shortness of breath productive cough patient underwent CT chest and abdomen which shows nothing acute except for which is concerning for interstitial lung disease.  Patient had blood cultures drawn and started on empiric antibiotics for possible pneumonia given the symptoms and admitted for further observation.  On exam patient has right lower extremity more than the left.  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 Questionable etiology.  Source of fever is unknown.  Patient with ongoing fevers for approximately a month per patient and wife.  Patient tested at least 4 times for COVID-19 which was negative.  Patient has been treated empirically with oral antibiotics of azithromycin and Levaquin over the past 10 days with no significant clinical improvement.  Patient admitted and placed empirically on IV antibiotics for presumed pneumonia.  Patient however denies any significant worsening of shortness of breath.  Patient noted to have a cough with fevers and chills with complaints of fatigue.  Urinalysis done nitrite negative leukocytes negative.  Urine strep pneumococcus antigen is negative.  Chest x-ray done with chronic interstitial disease changes greater on the left and right with no acute abnormalities.  CT abdomen and pelvis with no acute process noted in the chest abdomen or pelvis.  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  Suspicion of mild hepatic steatosis.  Due to ongoing fevers, fatigue, abnormal CT chest with no significant improvement on outpatient antibiotics concern for possible pneumonitis.  Patient currently afebrile since admission.  Patient with a leukocytosis with a left shift which is trending down.  Lactic acid level has trended down.  Procalcitonin is less than 0.10.  White count now is from 17.9 from 19.4.  Respiratory viral panel is negative.  Patient states diffuse muscle pain has improved significantly after being started on IV Solu-Medrol.  Patient afebrile.  Patient has been seen in consultation by pulmonary who feel patient likely has  a pneumonitis with possible polymyositis.  Autoimmune panel has been ordered per pulmonary which is currently pending.  CK levels are low.  IV antibiotics have been discontinued.  Continue Mucinex and PPI.  Continue Solu-Medrol 40 mg IV every 6 hours as recommended by  pulmonary.  Will likely need outpatient follow-up with rheumatology and pulmonary.  Pulmonary following and appreciate input and recommendations.  2.  Lower extremity edema Patient was started on Lasix.  Lower extremity edema seem to have improved on examination this morning.  Lower extremity Dopplers negative for DVT.  2D echo ordered and pending.  Outpatient follow-up.  3.  Well-controlled diabetes mellitus type 2 Hemoglobin A1c 6.2.  CBG of 214 this morning likely secondary to IV steroids.  Will place on low-dose Lantus 10 units daily.  Continue sliding scale insulin.  Follow.   4.  Acute kidney injury Questionable etiology.  Concern for prerenal azotemia in the setting of recent diuretic use and ARB.  ARB and diuretics on hold.  Renal function improved.  Follow.   5.  Hypertension Blood pressure stable.  ARB on hold secondary to acute kidney injury.  Follow for now.   6.  Coronary artery disease Currently stable.  Continue aspirin.  Statin on hold secondary to elevated LFTs.  CT abdomen and pelvis with concerns for hepatic steatosis.  LFTs slowly trending down.  Continue to hold statin for now.  Follow.  7.  Anemia/low vitamin B12 No overt bleeding.  Anemia panel consistent with anemia of chronic disease.  Patient with low vitamin B12 levels.  Hemoglobin stable at 11.2.  Will place on vitamin B12 1000 MCG's subcutaneous daily during the hospitalization and likely transition to oral vitamin B12 on discharge. Follow H&H.    DVT prophylaxis: Lovenox Code Status: Full Family Communication: Updated patient and wife at bedside. Disposition Plan: Likely home with home health services when okay with pulmonary.     Consultants:   Pulmonary: Dr. Nelda Marseille  Procedures:   CT chest/CT abdomen and pelvis 04/01/2019  Chest x-ray 04/01/2019  Lower extremity Dopplers 04/02/2019  Right upper quadrant ultrasound 04/01/2019  Antimicrobials:   IV Rocephin 04/01/2019>>>>> 04/02/2019  IV azithromycin  04/01/2019 >>>>>> 04/02/2019   Subjective: Patient sitting up in chair.  States he is feeling much better after being started on IV Solu-Medrol.  States diffuse muscle pain has improved however not at baseline.  Patient denies any chest pain or shortness of breath.  Patient states diffuse muscular pain improved as well as improvement with fatigue.  Patient stated able to ambulate a little bit better today.  Patient very appreciative of pulmonary's help and input.    Objective: Vitals:   04/02/19 0611 04/02/19 1700 04/02/19 2003 04/03/19 0550  BP: (!) 126/57  109/62 (!) 145/71  Pulse: 70  76 73  Resp: 16  18 16   Temp: 98.6 F (37 C) 97.8 F (36.6 C) 98.1 F (36.7 C) (!) 97.5 F (36.4 C)  TempSrc: Oral Oral Oral Oral  SpO2: 97%  97% 98%  Weight: 82.2 kg   82.2 kg  Height:        Intake/Output Summary (Last 24 hours) at 04/03/2019 1026 Last data filed at 04/03/2019 0843 Gross per 24 hour  Intake 720 ml  Output --  Net 720 ml   Filed Weights   04/01/19 2111 04/02/19 0611 04/03/19 0550  Weight: 82.6 kg 82.2 kg 82.2 kg    Examination:  General exam: NAD. Respiratory system: Some scattered coarse breath sounds otherwise clear.  No wheezing noted.  No crackles noted.  Speaking in full sentences.  Normal respiratory effort. Cardiovascular system: Regular rate and rhythm no murmurs rubs or gallops.  No JVD.  No lower extremity edema.  Gastrointestinal system: Abdomen is soft soft, nontender, nondistended, positive bowel sounds.  No rebound.  No guarding.  Central nervous system: Alert and oriented. No focal neurological deficits. Extremities: Symmetric 5 x 5 power. Skin: No rashes, lesions or ulcers Psychiatry: Judgement and insight appear normal. Mood & affect appropriate.     Data Reviewed: I have personally reviewed following labs and imaging studies  CBC: Recent Labs  Lab 04/01/19 1236 04/01/19 2309 04/02/19 0308 04/03/19 0544  WBC 17.6* 19.4* 17.9* 16.2*  NEUTROABS 13.2*   --  14.3* 14.4*  HGB 11.6* 10.8* 10.4* 11.2*  HCT 35.3* 32.3* 31.4* 34.2*  MCV 88.9 85.9 87.5 86.4  PLT 554* 451* 440* 101*   Basic Metabolic Panel: Recent Labs  Lab 04/01/19 1236 04/01/19 2309 04/02/19 0308 04/03/19 0544  NA 132*  --  132* 131*  K 4.8  --  4.2 4.6  CL 98  --  98 99  CO2 23  --  23 21*  GLUCOSE 140*  --  129* 272*  BUN 22  --  22 25*  CREATININE 1.49* 1.41* 1.26* 1.04  CALCIUM 8.5*  --  7.8* 8.4*   GFR: Estimated Creatinine Clearance: 58.2 mL/min (by C-G formula based on SCr of 1.04 mg/dL). Liver Function Tests: Recent Labs  Lab 04/01/19 1236 04/02/19 0308  AST 75* 42*  ALT 67* 47*  ALKPHOS 198* 167*  BILITOT 0.7 0.9  PROT 6.3* 5.3*  ALBUMIN 2.3* 1.8*   No results for input(s): LIPASE, AMYLASE in the last 168 hours. No results for input(s): AMMONIA in the last 168 hours. Coagulation Profile: No results for input(s): INR, PROTIME in the last 168 hours. Cardiac Enzymes: Recent Labs  Lab 04/02/19 1617  CKTOTAL 31*   BNP (last 3 results) No results for input(s): PROBNP in the last 8760 hours. HbA1C: Recent Labs    04/03/19 0544  HGBA1C 6.2*   CBG: Recent Labs  Lab 04/02/19 0611 04/02/19 1132 04/02/19 1650 04/02/19 2115 04/03/19 0619  GLUCAP 120* 153* 133* 252* 214*   Lipid Profile: No results for input(s): CHOL, HDL, LDLCALC, TRIG, CHOLHDL, LDLDIRECT in the last 72 hours. Thyroid Function Tests: No results for input(s): TSH, T4TOTAL, FREET4, T3FREE, THYROIDAB in the last 72 hours. Anemia Panel: Recent Labs    04/03/19 0544  VITAMINB12 341  FOLATE 15.4  FERRITIN 429*  TIBC 164*  IRON 27*   Sepsis Labs: Recent Labs  Lab 04/01/19 1610 04/01/19 2114 04/01/19 2309 04/02/19 0022 04/02/19 0308  PROCALCITON  --   --  <0.10  --   --   LATICACIDVEN 1.8 2.5*  --  1.9 1.6    Recent Results (from the past 240 hour(s))  Culture, blood (Routine X 2) w Reflex to ID Panel     Status: None (Preliminary result)   Collection Time:  04/01/19  2:17 PM   Specimen: BLOOD  Result Value Ref Range Status   Specimen Description BLOOD RIGHT ANTECUBITAL  Final   Special Requests   Final    BOTTLES DRAWN AEROBIC AND ANAEROBIC Blood Culture adequate volume   Culture   Final    NO GROWTH 1 DAY Performed at [facility], [place] [address] [place], [place] [address]    Report Status PENDING  Incomplete  SARS Coronavirus 2 [facility]  order, Performed in [facility] hospital lab) Nasopharyngeal Nasopharyngeal Swab     Status: None   Collection Time: 04/01/19  2:30 PM   Specimen: Nasopharyngeal Swab  Result Value Ref Range Status   SARS Coronavirus 2 NEGATIVE NEGATIVE Final    Comment: (NOTE) If result is NEGATIVE SARS-CoV-2 target nucleic acids are NOT DETECTED. The SARS-CoV-2 RNA is generally detectable in upper and lower  respiratory specimens during the acute phase of infection. The lowest  concentration of SARS-CoV-2 viral copies this assay can detect is 250  copies / mL. A negative result does not preclude SARS-CoV-2 infection  and should not be used as the sole basis for treatment or other  patient management decisions.  A negative result may occur with  improper specimen collection / handling, submission of specimen other  than nasopharyngeal swab, presence of viral mutation(s) within the  areas targeted by this assay, and inadequate number of viral copies  (<250 copies / mL). A negative result must be combined with clinical  observations, patient history, and epidemiological information. If result is POSITIVE SARS-CoV-2 target nucleic acids are DETECTED. The SARS-CoV-2 RNA is generally detectable in upper and lower  respiratory specimens dur ing the acute phase of infection.  Positive  results are indicative of active infection with SARS-CoV-2.  Clinical  correlation with patient history and other diagnostic information is  necessary to determine patient infection status.  Positive results do  not rule out  bacterial infection or co-infection with other viruses. If result is PRESUMPTIVE POSTIVE SARS-CoV-2 nucleic acids MAY BE PRESENT.   A presumptive positive result was obtained on the submitted specimen  and confirmed on repeat testing.  While 2019 novel coronavirus  (SARS-CoV-2) nucleic acids may be present in the submitted sample  additional confirmatory testing may be necessary for epidemiological  and / or clinical management purposes  to differentiate between  SARS-CoV-2 and other Sarbecovirus currently known to infect humans.  If clinically indicated additional testing with an alternate test  methodology [phone]) is advised. The SARS-CoV-2 RNA is generally  detectable in upper and lower respiratory sp ecimens during the acute  phase of infection. The expected result is Negative. Fact Sheet for Patients:  StrictlyIdeas.no Fact Sheet for Healthcare Providers: BankingDealers.co.za This test is not yet approved or cleared by the [place] FDA and has been authorized for detection and/or diagnosis of SARS-CoV-2 by FDA under an Emergency Use Authorization (EUA).  This EUA will remain in effect (meaning this test can be used) for the duration of the COVID-19 declaration under Section 564(b)(1) of the Act, 21 U.S.C. section 360bbb-3(b)(1), unless the authorization is terminated or revoked sooner. Performed at [facility], [place] [address] [place], [place] [address]   Culture, blood (Routine X 2) w Reflex to ID Panel     Status: None (Preliminary result)   Collection Time: 04/01/19  2:47 PM   Specimen: BLOOD RIGHT HAND  Result Value Ref Range Status   Specimen Description BLOOD RIGHT HAND  Final   Special Requests   Final    BOTTLES DRAWN AEROBIC AND ANAEROBIC Blood Culture results may not be optimal due to an inadequate volume of blood received in culture bottles   Culture   Final    NO GROWTH 1 DAY Performed at [facility], [place] [address] [place],  [address]    Report Status PENDING  Incomplete  Respiratory Panel by PCR     Status: None   Collection Time: 04/02/19  4:15 PM   Specimen: Nasopharyngeal Swab; Respiratory  Result Value Ref Range Status   Adenovirus NOT DETECTED NOT DETECTED Final   Coronavirus 229E NOT DETECTED NOT DETECTED Final    Comment: (NOTE) The Coronavirus on the Respiratory Panel, DOES NOT test for the novel  Coronavirus (2019 nCoV)    Coronavirus HKU1 NOT DETECTED NOT DETECTED Final   Coronavirus NL63 NOT DETECTED NOT DETECTED Final   Coronavirus OC43 NOT DETECTED NOT DETECTED Final   Metapneumovirus NOT DETECTED NOT DETECTED Final   Rhinovirus / Enterovirus NOT DETECTED NOT DETECTED Final   Influenza A NOT DETECTED NOT DETECTED Final   Influenza B NOT DETECTED NOT DETECTED Final   Parainfluenza Virus 1 NOT DETECTED NOT DETECTED Final   Parainfluenza Virus 2 NOT DETECTED NOT DETECTED Final   Parainfluenza Virus 3 NOT DETECTED NOT DETECTED Final   Parainfluenza Virus 4 NOT DETECTED NOT DETECTED Final   Respiratory Syncytial Virus NOT DETECTED NOT DETECTED Final   Bordetella pertussis NOT DETECTED NOT DETECTED Final   Chlamydophila pneumoniae NOT DETECTED NOT DETECTED Final   Mycoplasma pneumoniae NOT DETECTED NOT DETECTED Final    Comment: Performed at [facility], [place]. [address] [place], [place] [address]         Radiology Studies: Ct Chest W Contrast  Result Date: 04/01/2019 CLINICAL DATA:  Cough and fevers. Negative COVID-19 tests x2. Elevated liver function tests. Acute respiratory illness. EXAM: CT CHEST, ABDOMEN, AND PELVIS WITH CONTRAST TECHNIQUE: Multidetector CT imaging of the chest, abdomen and pelvis was performed following the standard protocol during bolus administration of intravenous contrast. CONTRAST:  125mL OMNIPAQUE IOHEXOL 300 MG/ML  SOLN COMPARISON:  Chest radiograph earlier today.  No comparison CTs. FINDINGS: CT CHEST FINDINGS  Cardiovascular: Aortic atherosclerosis. Tortuous thoracic aorta. Borderline cardiomegaly, without pericardial effusion. Multivessel coronary artery atherosclerosis. Pulmonary artery enlargement, outflow tract 3.3 cm. No central pulmonary embolism, on this non-dedicated study. Mediastinum/Nodes: borderline enlarged right paratracheal node of 1.0 cm on image 27/3. A subcarinal node measures 1.2 cm on 33/3. No hilar adenopathy. Lungs/Pleura: No pleural fluid. Diffuse but slightly basilar predominant subpleural reticulation with traction bronchiolectasis. This is slightly worse on the left. No areas of consolidation or ground-glass to suggest pneumonia. Musculoskeletal: No acute osseous abnormality. CT ABDOMEN PELVIS FINDINGS Hepatobiliary: Suspicion of mild hepatic steatosis. Normal gallbladder, without biliary ductal dilatation. Pancreas: Fatty atrophy throughout the pancreas. Spleen: Normal in size, without focal abnormality. Adrenals/Urinary Tract: Normal adrenal glands. Mild renal cortical thinning bilaterally. Bilateral renal sinus cysts, but no hydronephrosis. Mild bladder distension with minimal right-sided bladder at the origin of the right inguinal hernia. Example image 110/3 and coronal image 55. Stomach/Bowel: Normal stomach, without wall thickening. Scattered colonic diverticula. Normal terminal ileum. Normal small bowel. Vascular/Lymphatic: Aortic and branch vessel atherosclerosis. No abdominopelvic adenopathy. Reproductive: Normal prostate. Other: No significant free fluid. Right inguinal hernia contains fat and minimal urinary bladder. Musculoskeletal: Mild osteopenia. Convex right lumbar spine curvature. Lumbar spondylosis. IMPRESSION: 1. No acute process in the chest, abdomen, or pelvis. No acute explanation for patient's symptoms. 2. Interstitial lung disease, suspicious for early/mild usual interstitial pneumonitis (typically due to pulmonary fibrosis). Differential considerations include nonspecific  interstitial pneumonitis. Depending on ongoing chronic clinical symptomatology, consider high-resolution chest CT at 6-12 months. 3. Suspicion of mild hepatic steatosis. 4. Coronary artery atherosclerosis. Aortic Atherosclerosis (ICD10-I70.0). 5. Pulmonary artery enlargement suggests pulmonary arterial hypertension. 6. Right inguinal hernia containing fat and minimal urinary bladder at its origin. 7. Borderline thoracic adenopathy, favored to be reactive.  Electronically Signed   By: Abigail Miyamoto M.D.   On: 04/01/2019 19:06   Ct Abdomen Pelvis W Contrast  Result Date: 04/01/2019 CLINICAL DATA:  Cough and fevers. Negative COVID-19 tests x2. Elevated liver function tests. Acute respiratory illness. EXAM: CT CHEST, ABDOMEN, AND PELVIS WITH CONTRAST TECHNIQUE: Multidetector CT imaging of the chest, abdomen and pelvis was performed following the standard protocol during bolus administration of intravenous contrast. CONTRAST:  174mL OMNIPAQUE IOHEXOL 300 MG/ML  SOLN COMPARISON:  Chest radiograph earlier today.  No comparison CTs. FINDINGS: CT CHEST FINDINGS Cardiovascular: Aortic atherosclerosis. Tortuous thoracic aorta. Borderline cardiomegaly, without pericardial effusion. Multivessel coronary artery atherosclerosis. Pulmonary artery enlargement, outflow tract 3.3 cm. No central pulmonary embolism, on this non-dedicated study. Mediastinum/Nodes: borderline enlarged right paratracheal node of 1.0 cm on image 27/3. A subcarinal node measures 1.2 cm on 33/3. No hilar adenopathy. Lungs/Pleura: No pleural fluid. Diffuse but slightly basilar predominant subpleural reticulation with traction bronchiolectasis. This is slightly worse on the left. No areas of consolidation or ground-glass to suggest pneumonia. Musculoskeletal: No acute osseous abnormality. CT ABDOMEN PELVIS FINDINGS Hepatobiliary: Suspicion of mild hepatic steatosis. Normal gallbladder, without biliary ductal dilatation. Pancreas: Fatty atrophy throughout the  pancreas. Spleen: Normal in size, without focal abnormality. Adrenals/Urinary Tract: Normal adrenal glands. Mild renal cortical thinning bilaterally. Bilateral renal sinus cysts, but no hydronephrosis. Mild bladder distension with minimal right-sided bladder at the origin of the right inguinal hernia. Example image 110/3 and coronal image 55. Stomach/Bowel: Normal stomach, without wall thickening. Scattered colonic diverticula. Normal terminal ileum. Normal small bowel. Vascular/Lymphatic: Aortic and branch vessel atherosclerosis. No abdominopelvic adenopathy. Reproductive: Normal prostate. Other: No significant free fluid. Right inguinal hernia contains fat and minimal urinary bladder. Musculoskeletal: Mild osteopenia. Convex right lumbar spine curvature. Lumbar spondylosis. IMPRESSION: 1. No acute process in the chest, abdomen, or pelvis. No acute explanation for patient's symptoms. 2. Interstitial lung disease, suspicious for early/mild usual interstitial pneumonitis (typically due to pulmonary fibrosis). Differential considerations include nonspecific interstitial pneumonitis. Depending on ongoing chronic clinical symptomatology, consider high-resolution chest CT at 6-12 months. 3. Suspicion of mild hepatic steatosis. 4. Coronary artery atherosclerosis. Aortic Atherosclerosis (ICD10-I70.0). 5. Pulmonary artery enlargement suggests pulmonary arterial hypertension. 6. Right inguinal hernia containing fat and minimal urinary bladder at its origin. 7. Borderline thoracic adenopathy, favored to be reactive. Electronically Signed   By: Abigail Miyamoto M.D.   On: 04/01/2019 19:06   Dg Chest Port 1 View  Result Date: 04/01/2019 CLINICAL DATA:  Cough, shortness of breath and weakness for 3-4 months, COVID-19 like symptoms but 2 negative COVID-19 tests EXAM: PORTABLE CHEST 1 VIEW COMPARISON:  Portable exam 1527 hours compared to 03/25/2019 FINDINGS: Normal heart size, mediastinal contours, and pulmonary vascularity.  Chronic interstitial lung disease at the periphery of the mid to lower lungs bilaterally greater on LEFT. No definite acute infiltrate, pleural effusion or pneumothorax. Bones demineralized. IMPRESSION: Chronic interstitial disease changes greater on LEFT than RIGHT. No acute abnormalities. Electronically Signed   By: Lavonia Dana M.D.   On: 04/01/2019 15:48   Vas [place] Lower Extremity Venous (dvt)  Result Date: 04/02/2019  Lower Venous Study Indications: Edema.  Comparison Study: no prior Performing Technologist: Abram Sander RVS  Examination Guidelines: A complete evaluation includes B-mode imaging, spectral Doppler, color Doppler, and power Doppler as needed of all accessible portions of each vessel. Bilateral testing is considered an integral part of a complete examination. Limited examinations for reoccurring indications may be performed as noted.  +---------+---------------+---------+-----------+----------+-------+  RIGHT  Compressibility Phasicity Spontaneity Properties Summary  +---------+---------------+---------+-----------+----------+-------+  CFV       Full            Yes       Yes                             +---------+---------------+---------+-----------+----------+-------+  SFJ       Full                                                      +---------+---------------+---------+-----------+----------+-------+  FV Prox   Full                                                      +---------+---------------+---------+-----------+----------+-------+  FV Mid    Full                                                      +---------+---------------+---------+-----------+----------+-------+  FV Distal Full                                                      +---------+---------------+---------+-----------+----------+-------+  PFV       Full                                                      +---------+---------------+---------+-----------+----------+-------+  POP       Full            Yes       Yes                              +---------+---------------+---------+-----------+----------+-------+  PTV       Full                                                      +---------+---------------+---------+-----------+----------+-------+  PERO      Full                                                      +---------+---------------+---------+-----------+----------+-------+   +---------+---------------+---------+-----------+----------+-------+  LEFT      Compressibility Phasicity Spontaneity Properties Summary  +---------+---------------+---------+-----------+----------+-------+  CFV       Full            Yes       Yes                             +---------+---------------+---------+-----------+----------+-------+  SFJ       Full                                                      +---------+---------------+---------+-----------+----------+-------+  FV Prox   Full                                                      +---------+---------------+---------+-----------+----------+-------+  FV Mid    Full                                                      +---------+---------------+---------+-----------+----------+-------+  FV Distal Full                                                      +---------+---------------+---------+-----------+----------+-------+  PFV       Full                                                      +---------+---------------+---------+-----------+----------+-------+  POP       Full            Yes       Yes                             +---------+---------------+---------+-----------+----------+-------+  PTV       Full                                                      +---------+---------------+---------+-----------+----------+-------+  PERO      Full                                                      +---------+---------------+---------+-----------+----------+-------+     Summary: Right: There is no evidence of deep vein thrombosis in the lower extremity. No cystic structure found in the popliteal  fossa. Left: There is no evidence of deep vein thrombosis in the lower extremity. No cystic structure found in the popliteal fossa.  *See table(s) above for measurements and observations.    Preliminary    US Abdomen Limited Ruq  Result Date: 04/01/2019 CLINICAL DATA:  Elevated LFTs EXAM: ULTRASOUND ABDOMEN LIMITED RIGHT UPPER QUADRANT COMPARISON:  None. FINDINGS: Gallbladder: No gallstones or wall thickening visualized. No sonographic Murphy sign noted by sonographer. Common bile duct: Diameter: 5.3 mm Liver: Liver is echogenic. No focal hepatic  abnormality. Portal vein is patent on color Doppler imaging with normal direction of blood flow towards the liver. Other: None. IMPRESSION: 1. Negative for gallstones or biliary dilatation 2. Echogenic liver consistent with steatosis and or hepatocellular disease. Electronically Signed   By: Donavan Foil M.D.   On: 04/01/2019 16:57        Scheduled Meds:  aspirin  324 mg Oral Daily   cyanocobalamin  1,000 mcg Subcutaneous Daily   enoxaparin (LOVENOX) injection  40 mg Subcutaneous Q24H   guaiFENesin  1,200 mg Oral BID   insulin aspart  0-20 Units Subcutaneous TID WC   omega-3 acid ethyl esters  2 g Oral Daily   pantoprazole  40 mg Oral Daily   sodium chloride flush  3 mL Intravenous Once   traZODone  100 mg Oral QHS   Continuous Infusions:    LOS: 0 days    Time spent: 35 minutes    Irine Seal, MD [facility]  If 7PM-7AM, please contact night-coverage [email or web address] 04/03/2019, 10:26 AM

## 2019-04-04 ENCOUNTER — Inpatient Hospital Stay (HOSPITAL_COMMUNITY): Payer: Medicare HMO

## 2019-04-04 DIAGNOSIS — I340 Nonrheumatic mitral (valve) insufficiency: Secondary | ICD-10-CM

## 2019-04-04 DIAGNOSIS — I351 Nonrheumatic aortic (valve) insufficiency: Secondary | ICD-10-CM

## 2019-04-04 DIAGNOSIS — M3320 Polymyositis, organ involvement unspecified: Secondary | ICD-10-CM

## 2019-04-04 DIAGNOSIS — R740 Nonspecific elevation of levels of transaminase and lactic acid dehydrogenase [LDH]: Secondary | ICD-10-CM

## 2019-04-04 DIAGNOSIS — R0902 Hypoxemia: Secondary | ICD-10-CM

## 2019-04-04 LAB — CBC
HCT: 31.7 % — ABNORMAL LOW (ref 39.0–52.0)
Hemoglobin: 10.3 g/dL — ABNORMAL LOW (ref 13.0–17.0)
MCH: 28.2 pg (ref 26.0–34.0)
MCHC: 32.5 g/dL (ref 30.0–36.0)
MCV: 86.8 fL (ref 80.0–100.0)
Platelets: 472 10*3/uL — ABNORMAL HIGH (ref 150–400)
RBC: 3.65 MIL/uL — ABNORMAL LOW (ref 4.22–5.81)
RDW: 12.6 % (ref 11.5–15.5)
WBC: 19.8 10*3/uL — ABNORMAL HIGH (ref 4.0–10.5)
nRBC: 0 % (ref 0.0–0.2)

## 2019-04-04 LAB — GLUCOSE, CAPILLARY
Glucose-Capillary: 259 mg/dL — ABNORMAL HIGH (ref 70–99)
Glucose-Capillary: 205 mg/dL — ABNORMAL HIGH (ref 70–99)
Glucose-Capillary: 235 mg/dL — ABNORMAL HIGH (ref 70–99)
Glucose-Capillary: 351 mg/dL — ABNORMAL HIGH (ref 70–99)

## 2019-04-04 LAB — HEPATITIS PANEL, ACUTE
HCV Ab: 0.1 s/co ratio (ref 0.0–0.9)
Hep A IgM: NEGATIVE
Hep B C IgM: NEGATIVE
Hepatitis B Surface Ag: NEGATIVE

## 2019-04-04 LAB — ECHOCARDIOGRAM COMPLETE
Height: 68 in
Weight: 2924.8 oz

## 2019-04-04 LAB — ANTI-SCLERODERMA ANTIBODY: Scleroderma (Scl-70) (ENA) Antibody, IgG: <0.2 AI (ref 0.0–0.9)

## 2019-04-04 LAB — COMPREHENSIVE METABOLIC PANEL
ALT: 111 U/L — ABNORMAL HIGH (ref 0–44)
AST: 142 U/L — ABNORMAL HIGH (ref 15–41)
Albumin: 2.1 g/dL — ABNORMAL LOW (ref 3.5–5.0)
Alkaline Phosphatase: 160 U/L — ABNORMAL HIGH (ref 38–126)
Anion gap: 9 (ref 5–15)
BUN: 33 mg/dL — ABNORMAL HIGH (ref 8–23)
CO2: 22 mmol/L (ref 22–32)
Calcium: 8.2 mg/dL — ABNORMAL LOW (ref 8.9–10.3)
Chloride: 101 mmol/L (ref 98–111)
Creatinine, Ser: 1.26 mg/dL — ABNORMAL HIGH (ref 0.61–1.24)
GFR calc Af Amer: >60 mL/min (ref >60)
GFR calc non Af Amer: 53 mL/min — ABNORMAL LOW (ref >60)
Glucose, Bld: 269 mg/dL — ABNORMAL HIGH (ref 70–99)
Potassium: 5.1 mmol/L (ref 3.5–5.1)
Sodium: 132 mmol/L — ABNORMAL LOW (ref 135–145)
Total Bilirubin: 0.4 mg/dL (ref 0.3–1.2)
Total Protein: 5.8 g/dL — ABNORMAL LOW (ref 6.5–8.1)

## 2019-04-04 LAB — EBV AB TO VIRAL CAPSID AG PNL, IGG+IGM
EBV VCA IgG: >600 U/mL — ABNORMAL HIGH (ref 0.0–17.9)
EBV VCA IgM: <36 U/mL (ref 0.0–35.9)

## 2019-04-04 LAB — SJOGRENS SYNDROME-A EXTRACTABLE NUCLEAR ANTIBODY: SSA (Ro) (ENA) Antibody, IgG: <0.2 AI (ref 0.0–0.9)

## 2019-04-04 LAB — ANTI-DNA ANTIBODY, DOUBLE-STRANDED: ds DNA Ab: <1 IU/mL (ref 0–9)

## 2019-04-04 LAB — RHEUMATOID FACTORS, FLUID

## 2019-04-04 LAB — ANTI-JO 1 ANTIBODY, IGG: Anti JO-1: <0.2 AI (ref 0.0–0.9)

## 2019-04-04 LAB — SJOGRENS SYNDROME-B EXTRACTABLE NUCLEAR ANTIBODY: SSB (La) (ENA) Antibody, IgG: 0.2 AI (ref 0.0–0.9)

## 2019-04-04 LAB — ALDOLASE: Aldolase: 6.3 U/L (ref 3.3–10.3)

## 2019-04-04 LAB — MPO/PR-3 (ANCA) ANTIBODIES
ANCA Proteinase 3: <3.5 U/mL (ref 0.0–3.5)
Myeloperoxidase Abs: >100 U/mL — ABNORMAL HIGH (ref 0.0–9.0)

## 2019-04-04 MED ORDER — ENSURE ENLIVE PO LIQD
237.0000 mL | Freq: Two times a day (BID) | ORAL | Status: DC
  Administered 2019-04-04 – 2019-04-06 (×5): 237 mL via ORAL

## 2019-04-04 MED ORDER — INSULIN GLARGINE 100 UNIT/ML ~~LOC~~ SOLN
4.0000 [IU] | Freq: Once | SUBCUTANEOUS | Status: AC
  Administered 2019-04-04: 4 [IU] via SUBCUTANEOUS
  Filled 2019-04-04: qty 0.04

## 2019-04-04 MED ORDER — INSULIN GLARGINE 100 UNIT/ML ~~LOC~~ SOLN
14.0000 [IU] | Freq: Every day | SUBCUTANEOUS | Status: DC
  Filled 2019-04-04: qty 0.14

## 2019-04-04 MED ORDER — SODIUM CHLORIDE 0.9 % IV SOLN
INTRAVENOUS | Status: DC
  Administered 2019-04-04 – 2019-04-05 (×2): via INTRAVENOUS

## 2019-04-04 NOTE — Plan of Care (Signed)
  Problem: Clinical Measurements: Goal: Will remain free from infection Outcome: Completed/Met Goal: Respiratory complications will improve Outcome: Completed/Met   Problem: Activity: Goal: Risk for activity intolerance will decrease Outcome: Completed/Met   Problem: Safety: Goal: Ability to remain free from injury will improve Outcome: Completed/Met   Problem: Skin Integrity: Goal: Risk for impaired skin integrity will decrease Outcome: Completed/Met

## 2019-04-04 NOTE — Progress Notes (Addendum)
PROGRESS NOTE    Trayquan Kolakowski  ONG:[id number] DOB: [birth year] DOA: 04/01/2019 PCP: [name], MD    Brief Narrative:  HPI per Dr. Filiberto Pinks Marsala is a 83 y.o. male with history of CAD, hypertension, diabetes mellitus presents to the ER because of ongoing fever chills nonproductive cough and shortness of breath for the last 10 days.  Patient was placed on antibiotic which patient has been taking last week and has not found any changes in the fever pattern.  Patient's primary care physician advised to come to the ER.  Patient denies any nausea vomiting diarrhea abdominal pain dysuria or discharges.  Patient states over the last 10 days patient also had increasing lower extremity edema for which patient was placed on Lasix.  ED Course: In the ER patient was febrile with temperature of around 100.9.  Labs showed creatinine 1.7 sodium 132 patient's AST was 75 ALT 67 total bilirubin 1.7 alkaline phosphate 198.  WBC count was 19.4 hemoglobin 10.8 platelets 151.  Since patient had elevated LFTs patient underwent sonogram of the abdomen which shows hepatic steatosis.  Given the shortness of breath productive cough patient underwent CT chest and abdomen which shows nothing acute except for which is concerning for interstitial lung disease.  Patient had blood cultures drawn and started on empiric antibiotics for possible pneumonia given the symptoms and admitted for further observation.  On exam patient has right lower extremity more than the left.  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 Questionable etiology.  Source of fever is unknown.  Patient with ongoing fevers for approximately a month per patient and wife.  Patient tested at least 4 times for COVID-19 which was negative.  Patient has been treated empirically with oral antibiotics of azithromycin and Levaquin over the past 10 days with no significant clinical improvement.  Patient admitted and placed empirically on IV antibiotics for presumed pneumonia.  Patient however denies any significant worsening of shortness of breath.  Patient noted to have a cough with fevers and chills with complaints of fatigue.  Urinalysis done nitrite negative leukocytes negative.  Urine strep pneumococcus antigen is negative.  Chest x-ray done with chronic interstitial disease changes greater on the left and right with no acute abnormalities.  CT abdomen and pelvis with no acute process noted in the chest abdomen or pelvis.  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  Suspicion of mild hepatic steatosis.  Due to ongoing fevers, fatigue, abnormal CT chest with no significant improvement on outpatient antibiotics concern for possible pneumonitis.  Patient currently afebrile since admission.  Patient with a leukocytosis with a left shift which is trending down.  Lactic acid level has trended down.  Procalcitonin is less than 0.10.  White count now is from 17.9 from 19.4.  Respiratory viral panel is negative.  Patient states diffuse muscle pain has improved significantly after being started on IV Solu-Medrol.  Patient afebrile.  Patient has been seen in consultation by pulmonary who feel patient likely has  a pneumonitis with possible ?? polymyositis.  Autoimmune panel has been ordered per pulmonary which shows a anti-proteinase 3 less than 3.5, anti-Jo 1 less than 0.2, CCP antibodies IgG of 6, DS DNA antibody less than 1, myeloperoxidase antibodies greater than 100.  CK  levels are low.  IV antibiotics have been discontinued.  Continue Mucinex and PPI.  Continue Solu-Medrol 40 mg IV every 6 hours as recommended by pulmonary for total of 3 days and subsequently transition to oral prednisone until outpatient follow-up with rheumatology and pulmonary..  Will likely need outpatient follow-up with rheumatology and pulmonary.  Pulmonary following and appreciate input and recommendations.  2.  Lower extremity edema Questionable etiology.  Patient with no history of cirrhosis and CT abdomen and pelvis consistent with hepatic steatosis.  Patient with no signs of nephrotic syndrome at this time.  Urinalysis negative for proteinuria.  Patient with low albumin levels.  Placed on Ensure nutritional supplementation.  Patient was started on Lasix prior to admission.  Lasix on hold due to worsening renal function..  Lower extremity edema seem to have improved on examination this morning.  Lower extremity Dopplers negative for DVT.  2D echo ordered and pending.  Outpatient follow-up.  3.  Well-controlled diabetes mellitus type 2 Hemoglobin A1c 6.2.  CBG of 259 this morning likely secondary to IV steroids.  Increase Lantus to 14 units daily.  Continue sliding scale insulin.  Follow with steroid taper.    4.  Acute kidney injury Questionable etiology.  Concern for prerenal azotemia in the setting of recent diuretic use and ARB.  ARB and diuretics on hold.  Renal function improved.  Follow.   5.  Hypertension Blood pressure stable.  ARB on hold secondary to acute kidney injury.  Follow for now.   6.  Coronary artery disease Currently stable.  Continue aspirin.  Statin on hold secondary to elevated LFTs.  CT abdomen and pelvis with concerns for hepatic steatosis.  LFTs trending back up.  Continue to hold statin.  Follow.  7.  Anemia/low vitamin B12 No overt bleeding.  Anemia panel consistent with anemia of chronic disease.  Patient with low vitamin B12 levels.  Hemoglobin stable at  11.2.  Patient placed on vitamin B12 1000 MCG subcutaneous daily and could likely transition to oral vitamin B12 supplementation on discharge.  Outpatient follow-up.  8.  Transaminitis Patient with worsening LFTs.  CT abdomen and pelvis with concerns for hepatic steatosis.  Acute hepatitis panel is negative.  Patient with no history of cirrhosis.  Patient's BUN is elevated and concerns for possible dehydration.  Will place on gentle hydration with normal saline at 75 cc for the next 24 hours.  If no significant improvement with LFTs will consult with gastroenterology for further evaluation and management.  Follow.    DVT prophylaxis: Lovenox Code Status: Full Family Communication: Updated patient and wife at bedside. Disposition Plan: Likely home with home health services when okay with pulmonary hopefully tomorrow.     Consultants:   Pulmonary: Dr. Nelda Marseille  Procedures:   CT chest/CT abdomen and pelvis 04/01/2019  Chest x-ray 04/01/2019  Lower extremity Dopplers 04/02/2019  Right upper quadrant ultrasound 04/01/2019  Antimicrobials:   IV Rocephin 04/01/2019>>>>> 04/02/2019  IV azithromycin 04/01/2019 >>>>>> 04/02/2019   Subjective: Patient sitting up in bed.  States muscle pain improved.  Fatigue improving.  Denies any chest pain or shortness of breath.  Feeling better.  Patient with some complaints of pedal edema that has been ongoing prior to admission.  No abdominal pain.   Objective: Vitals:   04/03/19 1138 04/03/19 2004 04/04/19 0454 04/04/19 0907  BP: 125/80 139/66 (!) 146/79 (!) 154/67  Pulse: 92 64 (!) 59 62  Resp:  18 18 18   Temp: (!) 97.4 F (36.3 C) 98 F (36.7 C) 97.6 F (36.4 C) (!) 97.4 F (36.3 C)  TempSrc: Oral Oral Oral Oral  SpO2: 99% 96% 97% 98%  Weight:   82.9 kg   Height:        Intake/Output Summary (Last 24 hours) at 04/04/2019 1100 Last data filed at 04/04/2019 0828 Gross per 24 hour  Intake 740 ml  Output 1200 ml  Net -460 ml   Filed Weights    04/02/19 0611 04/03/19 0550 04/04/19 0454  Weight: 82.2 kg 82.2 kg 82.9 kg    Examination:  General exam: NAD. Respiratory system: Scattered coarse fine breath sounds.  No wheezing, no crackles.  Speaking in full sentences.  Cardiovascular system: RRR no murmurs rubs or gallops.  No JVD.  Trace to 1+ pedal edema.  Gastrointestinal system: Abdomen is nontender, nondistended, soft, positive bowel sounds.  No rebound.  No guarding. Central nervous system: Alert and oriented. No focal neurological deficits. Extremities: Symmetric 5 x 5 power. Skin: No rashes, lesions or ulcers Psychiatry: Judgement and insight appear normal. Mood & affect appropriate.     Data Reviewed: I have personally reviewed following labs and imaging studies  CBC: Recent Labs  Lab 04/01/19 1236 04/01/19 2309 04/02/19 0308 04/03/19 0544 04/04/19 0617  WBC 17.6* 19.4* 17.9* 16.2* 19.8*  NEUTROABS 13.2*  --  14.3* 14.4*  --   HGB 11.6* 10.8* 10.4* 11.2* 10.3*  HCT 35.3* 32.3* 31.4* 34.2* 31.7*  MCV 88.9 85.9 87.5 86.4 86.8  PLT 554* 451* 440* 436* 818*   Basic Metabolic Panel: Recent Labs  Lab 04/01/19 1236 04/01/19 2309 04/02/19 0308 04/03/19 0544 04/04/19 0617  NA 132*  --  132* 131* 132*  K 4.8  --  4.2 4.6 5.1  CL 98  --  98 99 101  CO2 23  --  23 21* 22  GLUCOSE 140*  --  129* 272* 269*  BUN 22  --  22 25* 33*  CREATININE 1.49* 1.41* 1.26* 1.04 1.26*  CALCIUM 8.5*  --  7.8* 8.4* 8.2*   GFR: Estimated Creatinine Clearance: 48.3 mL/min (A) (by C-G formula based on SCr of 1.26 mg/dL (H)). Liver Function Tests: Recent Labs  Lab 04/01/19 1236 04/02/19 0308 04/04/19 0617  AST 75* 42* 142*  ALT 67* 47* 111*  ALKPHOS 198* 167* 160*  BILITOT 0.7 0.9 0.4  PROT 6.3* 5.3* 5.8*  ALBUMIN 2.3* 1.8* 2.1*   No results for input(s): LIPASE, AMYLASE in the last 168 hours. No results for input(s): AMMONIA in the last 168 hours. Coagulation Profile: No results for input(s): INR, PROTIME in the last  168 hours. Cardiac Enzymes: Recent Labs  Lab 04/02/19 1617  CKTOTAL 31*   BNP (last 3 results) No results for input(s): PROBNP in the last 8760 hours. HbA1C: Recent Labs    04/03/19 0544  HGBA1C 6.2*   CBG: Recent Labs  Lab 04/03/19 0619 04/03/19 1214 04/03/19 1612 04/03/19 2129 04/04/19 0544  GLUCAP 214* 282* 241* 219* 259*   Lipid Profile: No results for input(s): CHOL, HDL, LDLCALC, TRIG, CHOLHDL, LDLDIRECT in the last 72 hours. Thyroid Function Tests: No results for input(s): TSH, T4TOTAL, FREET4, T3FREE, THYROIDAB in the last 72 hours. Anemia Panel: Recent Labs    04/03/19  0544  VITAMINB12 341  FOLATE 15.4  FERRITIN 429*  TIBC 164*  IRON 27*   Sepsis Labs: Recent Labs  Lab 04/01/19 1610 04/01/19 2114 04/01/19 2309 04/02/19 0022 04/02/19 0308  PROCALCITON  --   --  <0.10  --   --   LATICACIDVEN 1.8 2.5*  --  1.9 1.6    Recent Results (from the past 240 hour(s))  Culture, blood (Routine X 2) w Reflex to ID Panel     Status: None (Preliminary result)   Collection Time: 04/01/19  2:17 PM   Specimen: BLOOD  Result Value Ref Range Status   Specimen Description BLOOD RIGHT ANTECUBITAL  Final   Special Requests   Final    BOTTLES DRAWN AEROBIC AND ANAEROBIC Blood Culture adequate volume   Culture   Final    NO GROWTH 2 DAYS Performed at [facility], [place] [address], [place], [place] [address]    Report Status PENDING  Incomplete  SARS Coronavirus 2 [facility] order, Performed in [facility] hospital lab) Nasopharyngeal Nasopharyngeal Swab     Status: None   Collection Time: 04/01/19  2:30 PM   Specimen: Nasopharyngeal Swab  Result Value Ref Range Status   SARS Coronavirus 2 NEGATIVE NEGATIVE Final    Comment: (NOTE) If result is NEGATIVE SARS-CoV-2 target nucleic acids are NOT DETECTED. The SARS-CoV-2 RNA is generally detectable in upper and lower  respiratory specimens during the acute phase of infection. The lowest  concentration of  SARS-CoV-2 viral copies this assay can detect is 250  copies / mL. A negative result does not preclude SARS-CoV-2 infection  and should not be used as the sole basis for treatment or other  patient management decisions.  A negative result may occur with  improper specimen collection / handling, submission of specimen other  than nasopharyngeal swab, presence of viral mutation(s) within the  areas targeted by this assay, and inadequate number of viral copies  (<250 copies / mL). A negative result must be combined with clinical  observations, patient history, and epidemiological information. If result is POSITIVE SARS-CoV-2 target nucleic acids are DETECTED. The SARS-CoV-2 RNA is generally detectable in upper and lower  respiratory specimens dur ing the acute phase of infection.  Positive  results are indicative of active infection with SARS-CoV-2.  Clinical  correlation with patient history and other diagnostic information is  necessary to determine patient infection status.  Positive results do  not rule out bacterial infection or co-infection with other viruses. If result is PRESUMPTIVE POSTIVE SARS-CoV-2 nucleic acids MAY BE PRESENT.   A presumptive positive result was obtained on the submitted specimen  and confirmed on repeat testing.  While 2019 novel coronavirus  (SARS-CoV-2) nucleic acids may be present in the submitted sample  additional confirmatory testing may be necessary for epidemiological  and / or clinical management purposes  to differentiate between  SARS-CoV-2 and other Sarbecovirus currently known to infect humans.  If clinically indicated additional testing with an alternate test  methodology [phone]) is advised. The SARS-CoV-2 RNA is generally  detectable in upper and lower respiratory sp ecimens during the acute  phase of infection. The expected result is Negative. Fact Sheet for Patients:  StrictlyIdeas.no Fact Sheet for Healthcare  Providers: BankingDealers.co.za This test is not yet approved or cleared by the [place] FDA and has been authorized for detection and/or diagnosis of SARS-CoV-2 by FDA under an Emergency Use Authorization (EUA).  This EUA will remain in effect (meaning this test can  be used) for the duration of the COVID-19 declaration under Section 564(b)(1) of the Act, 21 U.S.C. section 360bbb-3(b)(1), unless the authorization is terminated or revoked sooner. Performed at [facility], [place] [address], [place], [place] [address]   Culture, blood (Routine X 2) w Reflex to ID Panel     Status: None (Preliminary result)   Collection Time: 04/01/19  2:47 PM   Specimen: BLOOD RIGHT HAND  Result Value Ref Range Status   Specimen Description BLOOD RIGHT HAND  Final   Special Requests   Final    BOTTLES DRAWN AEROBIC AND ANAEROBIC Blood Culture results may not be optimal due to an inadequate volume of blood received in culture bottles   Culture   Final    NO GROWTH 2 DAYS Performed at [facility], [place] [address] [place], [place] [address]    Report Status PENDING  Incomplete  Respiratory Panel by PCR     Status: None   Collection Time: 04/02/19  4:15 PM   Specimen: Nasopharyngeal Swab; Respiratory  Result Value Ref Range Status   Adenovirus NOT DETECTED NOT DETECTED Final   Coronavirus 229E NOT DETECTED NOT DETECTED Final    Comment: (NOTE) The Coronavirus on the Respiratory Panel, DOES NOT test for the novel  Coronavirus (2019 nCoV)    Coronavirus HKU1 NOT DETECTED NOT DETECTED Final   Coronavirus NL63 NOT DETECTED NOT DETECTED Final   Coronavirus OC43 NOT DETECTED NOT DETECTED Final   Metapneumovirus NOT DETECTED NOT DETECTED Final   Rhinovirus / Enterovirus NOT DETECTED NOT DETECTED Final   Influenza A NOT DETECTED NOT DETECTED Final   Influenza B NOT DETECTED NOT DETECTED Final   Parainfluenza Virus 1 NOT DETECTED NOT DETECTED Final   Parainfluenza Virus  2 NOT DETECTED NOT DETECTED Final   Parainfluenza Virus 3 NOT DETECTED NOT DETECTED Final   Parainfluenza Virus 4 NOT DETECTED NOT DETECTED Final   Respiratory Syncytial Virus NOT DETECTED NOT DETECTED Final   Bordetella pertussis NOT DETECTED NOT DETECTED Final   Chlamydophila pneumoniae NOT DETECTED NOT DETECTED Final   Mycoplasma pneumoniae NOT DETECTED NOT DETECTED Final    Comment: Performed at [facility], [place]. [address], [place], [place] [address]         Radiology Studies: No results found.      Scheduled Meds: . aspirin  324 mg Oral Daily  . cyanocobalamin  1,000 mcg Subcutaneous Daily  . enoxaparin (LOVENOX) injection  40 mg Subcutaneous Q24H  . guaiFENesin  1,200 mg Oral BID  . insulin aspart  0-20 Units Subcutaneous TID WC  . insulin glargine  10 Units Subcutaneous Daily  . methylPREDNISolone (SOLU-MEDROL) injection  40 mg Intravenous Q6H  . omega-3 acid ethyl esters  2 g Oral Daily  . pantoprazole  40 mg Oral Daily  . sodium chloride flush  3 mL Intravenous Once  . traZODone  100 mg Oral QHS   Continuous Infusions: . sodium chloride 75 mL/hr at 04/04/19 1025     LOS: 1 day    Time spent: 35 minutes    Irine Seal, MD [facility]  If 7PM-7AM, please contact night-coverage [email or web address] 04/04/2019, 11:00 AM

## 2019-04-04 NOTE — Progress Notes (Signed)
Inpatient Diabetes Program Recommendations  AACE/ADA: New Consensus Statement on Inpatient Glycemic Control   Target Ranges:  Prepandial:   less than 140 mg/dL      Peak postprandial:   less than 180 mg/dL (1-2 hours)      Critically ill patients:  140 - 180 mg/dL   Results for RIO, KIDANE (MRN [id number]) as of 04/04/2019 12:04  Ref. Range 04/03/2019 06:19 04/03/2019 12:14 04/03/2019 16:12 04/03/2019 21:29 04/04/2019 05:44 04/04/2019 11:18  Glucose-Capillary Latest Ref Range: 70 - 99 mg/dL 214 (H) 282 (H) 241 (H) 219 (H) 259 (H) 205 (H)   Results for VALERY, CHANCE (MRN [id number]) as of 04/04/2019 12:04  Ref. Range 04/03/2019 05:44  Hemoglobin A1C Latest Ref Range: 4.8 - 5.6 % 6.2 (H)   Review of Glycemic Control  Diabetes history:  DM2 Outpatient Diabetes medications: Metformin 500 mg QAM Current orders for Inpatient glycemic control: Lantus 14 units daily, Novolog 0-20 units TID with meals, Lantus 4 units x1 at 12:00 today; Solumedrol 40 mg Q6H  Inpatient Diabetes Program Recommendations:   Insulin - Basal: Noted Lantus increased to 14 units daily and one time Lantus 4 units ordered for total of 14 units today.  Insulin-Correction: Please consider ordering Novolog 0-5 units QHS for bedtime correction.  Insulin - Meal Coverage: If steroids are continued as ordered, please consider ordering Novolog 3 units TID with meals for meal coverage if patient eats at least 50% of meals.  Thanks, Barnie Alderman, RN, MSN, CDE Diabetes Coordinator Inpatient Diabetes Program [phone] (Team Pager from 8am to 5pm)

## 2019-04-04 NOTE — Progress Notes (Signed)
  Echocardiogram 2D Echocardiogram has been performed.  Kerry Matthews 04/04/2019, 5:35 PM

## 2019-04-04 NOTE — Progress Notes (Addendum)
NAME:  Kerry Matthews, MRN:  [id number], DOB:  March 07, 1936, LOS: 1 ADMISSION DATE:  04/01/2019, CONSULTATION DATE:  04/02/19 REFERRING MD:  Nile Riggs [place], CHIEF COMPLAINT:  Diffuse pain and fever   Brief History   83 year old diabetic male presenting to PCCM with diffuse muscle pain fever that started over a month ago.  Patient has been tried on 2 courses of zithromax and levaquin without success.  Fever occurs between 1 and 4 PM on a daily bases.  No other associated symptoms.  Improves with tylenol and worsens without.  Mild cough that is none productive.  COVID test negative x3.  No sick contacts.  Significant Hospital Events   Persistent fever  Consults:  PCCM  Procedures:  None  Significant Diagnostic Tests:  CT of the chest that I reviewed myself that showed evidence of very mild pneumonitis  Micro Data:  Blood cultures 8/7>>>NTD Procal <0.10  Antimicrobials:  Rocephin 8/7>>>8/8 Zithromax 8/7>>> 8/8 Previously finished a full course of zithromax and levaquin was completed 2 days  Interim history/subjective:  Feels much better today. Breathing better and much more energy.  Objective   Blood pressure (!) 154/67, pulse 62, temperature (!) 97.4 F (36.3 C), temperature source Oral, resp. rate 18, height 5\' 8"  (1.727 m), weight 82.9 kg, SpO2 98 %.        Intake/Output Summary (Last 24 hours) at 04/04/2019 1130 Last data filed at 04/04/2019 0828 Gross per 24 hour  Intake 740 ml  Output 1200 ml  Net -460 ml   Filed Weights   04/02/19 0611 04/03/19 0550 04/04/19 0454  Weight: 82.2 kg 82.2 kg 82.9 kg   Examination: General: elderly male resting comfortably in bed HENT: [place]/AT, PERRL, EOM-I and MMM Lungs: Clear bilateral breath sounds. Unlabored.  Cardiovascular: RRR, Nl S1/S2 Abdomen: Soft, NT, ND and +BS Extremities: Trace pedal edema.  Neuro: alert, oriented, non-focal.  Skin: Grossly intact.    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  Discussed with TRH-MD.  Autoimmune panel - Anti-DS DNA ab pending* - CRP pending* - Anti-Jo-1 pending* - CPK 31 - cANCA pending* - pANCA pending* - SSA and SSB pending* - Aldolase pending* - MPO pending* - PR3 pending* - CCP negative  CAP: - Monitor off abx  Pneumonitis: - Solumedrol 40 q6 x2 more days then change to prednisone 30 mg PO then drop to 20 mg daily until seen by pulmonary and rheumatology.  Steroids side effects: - Watch CBGs - Patient and wife warned about steroids psychosis and hyperglycemia  PCCM follow up arranged 8/18. Will sign off with recommendations as above. Please re-consult if needed.   Labs   CBC: Recent Labs  Lab 04/01/19 1236 04/01/19 2309 04/02/19 0308 04/03/19 0544 04/04/19 0617  WBC 17.6* 19.4* 17.9* 16.2* 19.8*  NEUTROABS 13.2*  --  14.3* 14.4*  --   HGB 11.6* 10.8* 10.4* 11.2* 10.3*  HCT 35.3* 32.3* 31.4* 34.2* 31.7*  MCV 88.9 85.9 87.5 86.4 86.8  PLT 554* 451* 440* 436* 472*    Basic Metabolic Panel: Recent Labs  Lab 04/01/19 1236 04/01/19 2309 04/02/19 0308 04/03/19 0544 04/04/19 0617  NA 132*  --  132* 131* 132*  K 4.8  --  4.2 4.6 5.1  CL 98  --  98 99 101  CO2 23  --  23 21* 22  GLUCOSE 140*  --  129*  272* 269*  BUN 22  --  22 25* 33*  CREATININE 1.49* 1.41* 1.26* 1.04 1.26*  CALCIUM 8.5*  --  7.8* 8.4* 8.2*   GFR: Estimated Creatinine Clearance: 48.3 mL/min (A) (by C-G formula based on SCr of 1.26 mg/dL (H)). Recent Labs  Lab 04/01/19 1610 04/01/19 2114 04/01/19 2309 04/02/19 0022 04/02/19 0308 04/03/19 0544 04/04/19 0617  PROCALCITON  --   --  <0.10  --   --   --   --   WBC  --   --  19.4*  --  17.9* 16.2* 19.8*  LATICACIDVEN 1.8 2.5*  --  1.9 1.6  --   --     Liver Function Tests: Recent Labs  Lab 04/01/19 1236 04/02/19 0308 04/04/19 0617  AST 75* 42* 142*  ALT 67* 47* 111*  ALKPHOS 198* 167* 160*   BILITOT 0.7 0.9 0.4  PROT 6.3* 5.3* 5.8*  ALBUMIN 2.3* 1.8* 2.1*   No results for input(s): LIPASE, AMYLASE in the last 168 hours. No results for input(s): AMMONIA in the last 168 hours.  ABG No results found for: PHART, PCO2ART, PO2ART, HCO3, TCO2, ACIDBASEDEF, O2SAT   Coagulation Profile: No results for input(s): INR, PROTIME in the last 168 hours.  Cardiac Enzymes: Recent Labs  Lab 04/02/19 1617  CKTOTAL 31*    HbA1C: Hgb A1c MFr Bld  Date/Time Value Ref Range Status  04/03/2019 05:44 AM 6.2 (H) 4.8 - 5.6 % Final    Comment:    (NOTE) Pre diabetes:          5.7%-6.4% Diabetes:              >6.4% Glycemic control for   <7.0% adults with diabetes     CBG: Recent Labs  Lab 04/03/19 1214 04/03/19 1612 04/03/19 2129 04/04/19 0544 04/04/19 [place]     Georgann Housekeeper, AGACNP-BC [place] Pager [phone] or [phone]  04/04/2019 11:39 AM  Attending Note:  83 year old male with alpha-1-anti-trypsin deficiency that is asymptomatic that comes in with mild hypoxemia, pulmonary fibrotic changes and diffuse muscular pain.  No events overnight, feels better.  On exam, alert and interactive with clear lungs.  I reviewed CXR myself, no acute disease noted.  Discussed with PCCM-NP.    Hypoxemia:             - Titrate O2 to off today  ILD:             - Solumedrol for today and tomorrow then change to PO prednisone until f/u with pulmonary  Myositis?:             - Steroids             - F/U on labs             - Needs to f/u with rheumatology as outpatient  PCCM will sign off, please call back if needed.  Patient seen and examined, agree with above note.  I dictated the care and orders written for this patient under my direction.  Rush Farmer, [place]

## 2019-04-04 NOTE — Plan of Care (Signed)

## 2019-04-05 DIAGNOSIS — R509 Fever, unspecified: Secondary | ICD-10-CM

## 2019-04-05 DIAGNOSIS — R7401 Elevation of levels of liver transaminase levels: Secondary | ICD-10-CM

## 2019-04-05 DIAGNOSIS — R945 Abnormal results of liver function studies: Secondary | ICD-10-CM

## 2019-04-05 LAB — BASIC METABOLIC PANEL
Anion gap: 8 (ref 5–15)
BUN: 34 mg/dL — ABNORMAL HIGH (ref 8–23)
CO2: 23 mmol/L (ref 22–32)
Calcium: 8 mg/dL — ABNORMAL LOW (ref 8.9–10.3)
Chloride: 101 mmol/L (ref 98–111)
Creatinine, Ser: 1.16 mg/dL (ref 0.61–1.24)
GFR calc Af Amer: >60 mL/min (ref >60)
GFR calc non Af Amer: 59 mL/min — ABNORMAL LOW (ref >60)
Glucose, Bld: 307 mg/dL — ABNORMAL HIGH (ref 70–99)
Potassium: 4.8 mmol/L (ref 3.5–5.1)
Sodium: 132 mmol/L — ABNORMAL LOW (ref 135–145)

## 2019-04-05 LAB — CBC WITH DIFFERENTIAL/PLATELET
Abs Immature Granulocytes: 0.61 10*3/uL — ABNORMAL HIGH (ref 0.00–0.07)
Basophils Absolute: 0 10*3/uL (ref 0.0–0.1)
Basophils Relative: 0 %
Eosinophils Absolute: 0 10*3/uL (ref 0.0–0.5)
Eosinophils Relative: 0 %
HCT: 31 % — ABNORMAL LOW (ref 39.0–52.0)
Hemoglobin: 10.3 g/dL — ABNORMAL LOW (ref 13.0–17.0)
Immature Granulocytes: 4 %
Lymphocytes Relative: 7 %
Lymphs Abs: 1.1 10*3/uL (ref 0.7–4.0)
MCH: 29.1 pg (ref 26.0–34.0)
MCHC: 33.2 g/dL (ref 30.0–36.0)
MCV: 87.6 fL (ref 80.0–100.0)
Monocytes Absolute: 0.7 10*3/uL (ref 0.1–1.0)
Monocytes Relative: 4 %
Neutro Abs: 13.1 10*3/uL — ABNORMAL HIGH (ref 1.7–7.7)
Neutrophils Relative %: 85 %
Platelets: 489 10*3/uL — ABNORMAL HIGH (ref 150–400)
RBC: 3.54 MIL/uL — ABNORMAL LOW (ref 4.22–5.81)
RDW: 12.7 % (ref 11.5–15.5)
WBC: 15.5 10*3/uL — ABNORMAL HIGH (ref 4.0–10.5)
nRBC: 0 % (ref 0.0–0.2)

## 2019-04-05 LAB — HEPATIC FUNCTION PANEL
ALT: 190 U/L — ABNORMAL HIGH (ref 0–44)
AST: 163 U/L — ABNORMAL HIGH (ref 15–41)
Albumin: 2 g/dL — ABNORMAL LOW (ref 3.5–5.0)
Alkaline Phosphatase: 150 U/L — ABNORMAL HIGH (ref 38–126)
Bilirubin, Direct: 0.1 mg/dL (ref 0.0–0.2)
Indirect Bilirubin: 0.5 mg/dL (ref 0.3–0.9)
Total Bilirubin: 0.6 mg/dL (ref 0.3–1.2)
Total Protein: 5.5 g/dL — ABNORMAL LOW (ref 6.5–8.1)

## 2019-04-05 LAB — HEPATITIS PANEL, ACUTE
HCV Ab: <0.1 s/co ratio (ref 0.0–0.9)
Hep A IgM: NEGATIVE
Hep B C IgM: NEGATIVE
Hepatitis B Surface Ag: NEGATIVE

## 2019-04-05 LAB — ANCA TITERS
Atypical P-ANCA titer: <1:20 {titer}
C-ANCA: <1:20 {titer}
P-ANCA: 1:160 {titer} — ABNORMAL HIGH

## 2019-04-05 LAB — GLUCOSE, CAPILLARY
Glucose-Capillary: 229 mg/dL — ABNORMAL HIGH (ref 70–99)
Glucose-Capillary: 358 mg/dL — ABNORMAL HIGH (ref 70–99)
Glucose-Capillary: 354 mg/dL — ABNORMAL HIGH (ref 70–99)
Glucose-Capillary: 182 mg/dL — ABNORMAL HIGH (ref 70–99)

## 2019-04-05 LAB — RHEUMATOID FACTOR: Rheumatoid fact SerPl-aCnc: 95.4 IU/mL — ABNORMAL HIGH (ref 0.0–13.9)

## 2019-04-05 LAB — MAGNESIUM: Magnesium: 2 mg/dL (ref 1.7–2.4)

## 2019-04-05 MED ORDER — INSULIN ASPART 100 UNIT/ML ~~LOC~~ SOLN
4.0000 [IU] | Freq: Three times a day (TID) | SUBCUTANEOUS | Status: DC
  Administered 2019-04-05 – 2019-04-06 (×5): 4 [IU] via SUBCUTANEOUS

## 2019-04-05 MED ORDER — INSULIN GLARGINE 100 UNIT/ML ~~LOC~~ SOLN
18.0000 [IU] | Freq: Every day | SUBCUTANEOUS | Status: DC
  Administered 2019-04-05 – 2019-04-06 (×2): 18 [IU] via SUBCUTANEOUS
  Filled 2019-04-05 (×3): qty 0.18

## 2019-04-05 MED ORDER — PREDNISONE 20 MG PO TABS
30.0000 mg | ORAL_TABLET | Freq: Every day | ORAL | Status: DC
  Administered 2019-04-06: 30 mg via ORAL
  Filled 2019-04-05: qty 1

## 2019-04-05 NOTE — Consult Note (Signed)
[facility] Consult: 9:27 AM 04/05/2019  LOS: 2 days    Referring Provider: Dr. Irine Seal Primary Care Physician:  [name], MD Primary Gastroenterologist:  Dr Lyndel Safe. Not seen since 2004    Reason for Consultation: Rising LFTs.   HPI: Kerry Matthews is a 83 y.o. male.  PMH CAD.  Cardiac stent ~ 2009 in [facility].  DM 2.   Hypertension.  CKD stage 2. Emphysema.  Nephrolithiasis.  Arthritis.  Alpha 1 antitrypsin deficiency.  GERD.  Colon polyps.  Colitis. 07/2002 colonoscopy: Left-sided colitis (self-limited colitis versus IBD), polyp (tubular adenoma). 08/2002 colonoscopy.  Dr Lynn Ito at [facility], procedure report not found.  Pathology report showed terminal ileal biopsies negative.  Tubular adenoma.  Active, chronic inflammatory bowel disease with cryptitis, crypt abscesses.   04/2003 EGD: Irregular Z line (no Barrett's on biopsy), mild gastritis.  CLO negative. 05/2005 colonoscopy.  Colitis at splenic flexure.  Mild pandiverticulosis.  Internal hemorrhoids.  2 small polyps removed.  Pathology: non-specific, chronic colitis, mild.  Tubular adenomas.  As of 11/2018 reported walking a mile 3-4 times a week without difficulty.  Presented to the ED 04/01/2019 with 11 days of cough, fever, muscle pain.  Initial CXR C/W pneumonia, COVID negative.  No improvement with single course of azithromycin and then a course of Levaquin.  Daily mid day fevers.  8/3 repeat CXR showing ongoing pneumonia and repeat negative COVID.   8/7 PCP noted the above and elevated LFTs.  Referred to the ED.  Temp 100.9. Now DXd with interstitial pneumonitis, polymyositis.  Multiple autoimmune markers ordered per pulmonary.  Solu-Medrol >> with transition to prednisone. Received azithromycin, ceftriaxone 1 dose each on 8/7,  discontinued thereafter.  Minimal acetaminophen, 650 mg once on 8/7.  At home for about 3 weeks was taking 1300 mg Tylenol daily.   Fevers resolved during admission.  Plans for rheumatology, pulmonary fup.  Interestingly patient did not have complaint of shortness of breath or cough with all of this.  WBCs remain elevated Transaminases continue to rise while Alk phos improving and T bili never elevated.  Elevated alk phos on outp labs 6/23:  0.7,  151.   23/18 (T bili.  Alk phos.  AST/ALT) Again on 7/20:   0.3. 183.  38/40.   During current admission:   T bili 0.9 >> 0.6 Alkaline phosphatase 198 >> 160 >> 150 AST/ALT 75/67 >> 42/47 >> 163/190 04/01/19 CTAP with contrast.  Interstitial lung disease.  Early/mild interstitial pneumonitis. Borderline thoracic adenopathy, favor reactive.  Suspicion of mild hepatic steatosis.  Coronary and aortic atherosclerosis.  Enlarged pulmonary artery.  S/O pulmonary arterial hypertension.  Fat and minimal urinary bladder containing right inguinal hernia 04/01/2019 abdominal ultrasound.  No gallstones.  No dilated bile duct.  CBD 5.3 mm.  Liver echogenic consistent with steatosis and/or hepatocellular disease.  Has had no issues with anorexia, abdominal pain, jaundice.  Did have lower extremity edema over the last few weeks which has pretty much resolved during admission. Insofar as the remote history of colitis, pt has daily, formed,  brown stools without bleeding.  Weight stable. Addition to the 1300 mg of Tylenol daily he takes Lipitor.  Does not drink alcohol.  Family history negative for IBD (someone had charted that his son has Crohn's disease but patient and his wife did not confirm this).  No colon cancer.  No liver disease.   Past Medical History:  Diagnosis Date  . Coronary artery disease   . Diabetes ([place])   . Hyperlipidemia   . Hypertension   . Osteoarthritis   . Renal stones   . Skin cancer     Past Surgical History:  Procedure Laterality Date   . BACK SURGERY    . CATARACT EXTRACTION    . TRIGGER FINGER RELEASE      Prior to Admission medications   Medication Sig Start Date End Date Taking? Authorizing Provider  aspirin 81 MG EC tablet Take 324 mg by mouth daily.    Yes [provider]  atorvastatin (LIPITOR) 40 MG tablet Take 40 mg by mouth daily at 6 PM.    Yes [provider]  Coenzyme Q10 (CO Q 10) 10 MG CAPS Take 1 capsule by mouth daily.   Yes [provider]  furosemide (LASIX) 40 MG tablet Take 40 mg by mouth daily.    Yes [provider]  losartan (COZAAR) 50 MG tablet Take 1 tablet by mouth 2 (two) times daily. 12/06/18  Yes [provider]  metFORMIN (GLUCOPHAGE) 500 MG tablet Take 500 mg by mouth daily with breakfast.    Yes [provider]  Multiple Vitamins-Minerals (PRESERVISION AREDS 2+MULTI VIT) CAPS Take 1 capsule by mouth 2 (two) times daily.   Yes [provider]  nitroGLYCERIN (NITROSTAT) 0.4 MG SL tablet Place 0.4 mg under the tongue every 5 (five) minutes as needed.   Yes [provider]  Omega-3 Fatty Acids (FISH OIL) 1000 MG CAPS Take 1,000 mg by mouth 2 (two) times daily.    Yes [provider]  omeprazole (PRILOSEC) 20 MG capsule Take 20 mg by mouth daily.   Yes [provider]  traZODone (DESYREL) 50 MG tablet Take 2 tablets by mouth at bedtime.   Yes [provider]  azithromycin (ZITHROMAX) 250 MG tablet Take 250 mg by mouth daily. 03/31/19   [provider]  levofloxacin (LEVAQUIN) 750 MG tablet Take 750 mg by mouth daily. 03/22/19   [provider]    Scheduled Meds: . aspirin  324 mg Oral Daily  . cyanocobalamin  1,000 mcg Subcutaneous Daily  . enoxaparin (LOVENOX) injection  40 mg Subcutaneous Q24H  . feeding supplement (ENSURE ENLIVE)  237 mL Oral BID BM  . guaiFENesin  1,200 mg Oral BID  . insulin aspart  0-20 Units Subcutaneous TID WC  . insulin aspart  4 Units Subcutaneous TID  WC  . insulin glargine  18 Units Subcutaneous Daily  . methylPREDNISolone (SOLU-MEDROL) injection  40 mg Intravenous Q6H  . omega-3 acid ethyl esters  2 g Oral Daily  . pantoprazole  40 mg Oral Daily  . sodium chloride flush  3 mL Intravenous Once  . traZODone  100 mg Oral QHS   Infusions:  PRN Meds: acetaminophen **OR** acetaminophen, hydrALAZINE, nitroGLYCERIN, ondansetron **OR** ondansetron (ZOFRAN) IV   Allergies as of 04/01/2019 - Review Complete 04/01/2019  Allergen Reaction Noted  . Ace inhibitors Other (See Comments) 05/03/2018  . Hydrocodone Nausea And Vomiting 12/28/2015  . Hydrocodone-acetaminophen Nausea And Vomiting 05/03/2018  . Sulfamethoxazole-trimethoprim  06/30/2016  Family History  Problem Relation Age of Onset  . Tuberculosis Mother   . Stroke Father   . Pancreatic cancer Sister   . Heart attack Sister   . Lung disease Sister   . Clotting disorder Brother     Social History   Socioeconomic History  . Marital status: Married    Spouse name: Not on file  . Number of children: Not on file  . Years of education: Not on file  . Highest education level: Not on file  Occupational History  . Not on file  Social Needs  . Financial resource strain: Not on file  . Food insecurity    Worry: Not on file    Inability: Not on file  . Transportation needs    Medical: Not on file    Non-medical: Not on file  Tobacco Use  . Smoking status: Never Smoker  . Smokeless tobacco: Never Used  Substance and Sexual Activity  . Alcohol use: Not on file  . Drug use: Not on file  . Sexual activity: Not on file  Lifestyle  . Physical activity    Days per week: Not on file    Minutes per session: Not on file  . Stress: Not on file  Relationships  . Social Herbalist on phone: Not on file    Gets together: Not on file    Attends religious service: Not on file    Active member of club or organization: Not on file    Attends meetings of clubs or  organizations: Not on file    Relationship status: Not on file  . Intimate partner violence    Fear of current or ex partner: Not on file    Emotionally abused: Not on file    Physically abused: Not on file    Forced sexual activity: Not on file  Other Topics Concern  . Not on file  Social History Narrative  . Not on file    REVIEW OF SYSTEMS: Constitutional: Malaise and fatigue have resolved. ENT:  No nose bleeds Pulm: Denies shortness of breath and cough. CV:  No palpitations, no CP.  Lower extremity edema resolved. GU:  No hematuria, no frequency GI: See HPI. Heme: No unusual or excessive bleeding or bruising. Transfusions: None. Neuro:  No headaches, no peripheral tingling or numbness Derm:  No itching, no rash or sores.  Endocrine:  No sweats or chills.  No polyuria or dysuria Immunization: Not queried. Travel:  None beyond local counties in last few months.    PHYSICAL EXAM: Vital signs in last 24 hours: Vitals:   04/05/19 0459 04/05/19 0840  BP: (!) 168/77 (!) 164/103  Pulse: 60 62  Resp: 16 18  Temp: (!) 97.5 F (36.4 C) 97.6 F (36.4 C)  SpO2: 96% 98%   Wt Readings from Last 3 Encounters:  04/05/19 84.2 kg  12/20/18 83.9 kg  05/11/18 83.9 kg    General: Pleasant, well-appearing elderly WM. Head: No facial asymmetry or swelling.  No signs of head trauma. Eyes: No scleral icterus, no conjunctival pallor.  EOMI. Ears: Not HOH Nose: No discharge or congestion Mouth: Moist, clear, pink oral mucosa.  Tongue midline.  His partial dentures are at home.  He has many missing teeth. Neck: No JVD, no masses, no thyromegaly. Lungs: Clear bilaterally.  No labored breathing or cough. Heart: RRR.  No MRG.  S1, S2 present Abdomen: Soft.  Not tender or distended.  No HSM, masses, bruits.  Hernia  not appreciated..   Rectal: Deferred Musc/Skeltl: No joint redness, swelling or significant deformity. Extremities: No CCE.  Feet are warm, pedal reperfusion brisk.  Neurologic: Oriented x3.  Good historian.  No tremors, no gross weakness.  Walking in the room without assistance. Skin: No rash, no sores, no telangiectasia. Tattoos: None. Nodes: No cervical adenopathy. Psych: Cooperative, calm, pleasant.  Normal speech and affect.  Intake/Output from previous day: 08/10 0701 - 08/11 0700 In: 2304.4 [P.O.:1080; I.V.:1224.4] Out: 3020 [Urine:3020] Intake/Output this shift: Total I/O In: 240 [P.O.:240] Out: -   LAB RESULTS: Recent Labs    04/03/19 0544 04/04/19 0617 04/05/19 0430  WBC 16.2* 19.8* 15.5*  HGB 11.2* 10.3* 10.3*  HCT 34.2* 31.7* 31.0*  PLT 436* 472* 489*   BMET Lab Results  Component Value Date   NA 132 (L) 04/05/2019   NA 132 (L) 04/04/2019   NA 131 (L) 04/03/2019   K 4.8 04/05/2019   K 5.1 04/04/2019   K 4.6 04/03/2019   CL 101 04/05/2019   CL 101 04/04/2019   CL 99 04/03/2019   CO2 23 04/05/2019   CO2 22 04/04/2019   CO2 21 (L) 04/03/2019   GLUCOSE 307 (H) 04/05/2019   GLUCOSE 269 (H) 04/04/2019   GLUCOSE 272 (H) 04/03/2019   BUN 34 (H) 04/05/2019   BUN 33 (H) 04/04/2019   BUN 25 (H) 04/03/2019   CREATININE 1.16 04/05/2019   CREATININE 1.26 (H) 04/04/2019   CREATININE 1.04 04/03/2019   CALCIUM 8.0 (L) 04/05/2019   CALCIUM 8.2 (L) 04/04/2019   CALCIUM 8.4 (L) 04/03/2019   LFT Recent Labs    04/04/19 0617 04/05/19 0430  PROT 5.8* 5.5*  ALBUMIN 2.1* 2.0*  AST 142* 163*  ALT 111* 190*  ALKPHOS 160* 150*  BILITOT 0.4 0.6  BILIDIR  --  0.1  IBILI  --  0.5   PT/INR No results found for: INR, PROTIME Hepatitis Panel Recent Labs    04/04/19 0617  HEPBSAG Negative  HCVAB <0.1  HEPAIGM Negative  HEPBIGM Negative   C-Diff No components found for: CDIFF Lipase  No results found for: LIPASE  Drugs of Abuse  No results found for: LABOPIA, COCAINSCRNUR, LABBENZ, AMPHETMU, THCU, LABBARB   RADIOLOGY STUDIES: No results found.    IMPRESSION:   *   Elevated LFTs, date back to at least  02/12/2019 when his alk phos was elevated.  Alk phos improved, transaminases rising.  T bili never elevated. Imaging (CT, ultrasound) reveals hepatic steatosis, possible hepatocellular disease, no gallstones/GB disease/ductal dilatation. Known alpha 1 antitrypsin deficiency.  *    Interstitial pneumonitis, polymyositis.  Improving with steroids.  *    Alpha-1 antitrypsin.  Previously described as asymptomatic.  *    Hx colitis, non-specific vs IBD. No colitis meds now or in past.     *   Hx adenomatous colon polyps colonoscopy 2003, 2004.  Last colonoscopy 2004  *    GERD.   Chronic PPI at home.    *   NIDDM.    *   CAD.  Previous cardiac stent, date unknown.  Extent of dz not charted. 83 ASA at home.       PLAN:     *  Per Dr Bryan Lemma.     Azucena Freed  04/05/2019, 9:27 AM Phone [phone]

## 2019-04-05 NOTE — Progress Notes (Signed)
Inpatient Diabetes Program Recommendations  AACE/ADA: New Consensus Statement on Inpatient Glycemic Control   Target Ranges:  Prepandial:   less than 140 mg/dL      Peak postprandial:   less than 180 mg/dL (1-2 hours)      Critically ill patients:  140 - 180 mg/dL  Results for CHRITOPHER, COSTER (MRN [id number]) as of 04/05/2019 11:06  Ref. Range 04/04/2019 05:44 04/04/2019 11:18 04/04/2019 15:29 04/04/2019 22:15 04/05/2019 06:28  Glucose-Capillary Latest Ref Range: 70 - 99 mg/dL 259 (H) 205 (H) 235 (H) 351 (H) 229 (H)    Review of Glycemic Control  Diabetes history:  DM2 Outpatient Diabetes medications: Metformin 500 mg QAM Current orders for Inpatient glycemic control: Lantus 18 units daily, Novolog 0-20 units TID with meals, Novolog 4 units TID with meals for meal coverage; Solumedrol 40 mg Q6H  Inpatient Diabetes Program Recommendations:   Insulin - Basal: Noted Lantus increased to 18 units daily today.  Insulin-Correction: Bedtime glucose 351 mg/dl last night but no insulin given since no correction for bedtime ordered.  Please consider ordering Novolog 0-5 units QHS for bedtime correction.  Insulin - Meal Coverage: Noted Novolog 4 units TID with meals for meal coverage ordered today.  Thanks, Barnie Alderman, RN, MSN, CDE Diabetes Coordinator Inpatient Diabetes Program [phone] (Team Pager from 8am to 5pm)

## 2019-04-05 NOTE — Progress Notes (Signed)
PROGRESS NOTE    Kerry Matthews  MWU:[id number] DOB: Feb 14, 1936 DOA: 04/01/2019 PCP: [name], MD    Brief Narrative:  HPI per Dr. Filiberto Pinks Kerry Matthews is a 83 y.o. male with history of CAD, hypertension, diabetes mellitus presents to the ER because of ongoing fever chills nonproductive cough and shortness of breath for the last 10 days.  Patient was placed on antibiotic which patient has been taking last week and has not found any changes in the fever pattern.  Patient's primary care physician advised to come to the ER.  Patient denies any nausea vomiting diarrhea abdominal pain dysuria or discharges.  Patient states over the last 10 days patient also had increasing lower extremity edema for which patient was placed on Lasix.  ED Course: In the ER patient was febrile with temperature of around 100.9.  Labs showed creatinine 1.7 sodium 132 patient's AST was 75 ALT 67 total bilirubin 1.7 alkaline phosphate 198.  WBC count was 19.4 hemoglobin 10.8 platelets 151.  Since patient had elevated LFTs patient underwent sonogram of the abdomen which shows hepatic steatosis.  Given the shortness of breath productive cough patient underwent CT chest and abdomen which shows nothing acute except for which is concerning for interstitial lung disease.  Patient had blood cultures drawn and started on empiric antibiotics for possible pneumonia given the symptoms and admitted for further observation.  On exam patient has right lower extremity more than the left.  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 Questionable etiology.  Source of fever is unknown.  Fever likely secondary to pneumonitis and concern for polymyositis.  Patient with ongoing fevers for approximately a month per patient and wife.  Patient tested at least 4 times for COVID-19 which was negative.  Patient has been treated empirically with oral antibiotics of azithromycin and Levaquin over the past 10 days with no significant clinical improvement.  Patient admitted and placed empirically on IV antibiotics for presumed pneumonia.  Patient however denies any significant worsening of shortness of breath.  Patient noted to have a cough with fevers and chills with complaints of fatigue.  Urinalysis done nitrite negative leukocytes negative.  Urine strep pneumococcus antigen is negative.  Chest x-ray done with chronic interstitial disease changes greater on the left and right with no acute abnormalities.  CT abdomen and pelvis with no acute process noted in the chest abdomen or pelvis.  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  Suspicion of mild hepatic steatosis.  Due to ongoing fevers, fatigue, abnormal CT chest with no significant improvement on outpatient antibiotics concern for possible pneumonitis.  Patient currently afebrile since admission.  Patient with a leukocytosis with a left shift which is trending down.  Lactic acid level has trended down.  Procalcitonin is less than 0.10.  White count now is 15.5 from 19.8 from 16.2 from 17.9 from 19.4.  Respiratory viral panel is negative.  Patient states diffuse muscle pain has improved significantly after being  started on IV Solu-Medrol.  Patient afebrile.  Patient has been seen in consultation by pulmonary who feel patient likely has a pneumonitis with possible ?? polymyositis.  Autoimmune panel has been ordered per pulmonary which shows a anti-proteinase 3 less than 3.5, anti-Jo 1 less  than 0.2, CCP antibodies IgG of 6, DS DNA antibody less than 1, myeloperoxidase antibodies greater than 100.  CK levels are low.  RA factor elevated at 95.4.   IV antibiotics have been discontinued.  Continue Mucinex and PPI.  Continue Solu-Medrol 40 mg IV every 6 hours as recommended by pulmonary for total of 3 days.  Will transition to oral prednisone 30 mg daily tomorrow  until outpatient follow-up with rheumatology and pulmonary per pulmonary recommendations...  Will likely need outpatient follow-up with rheumatology and pulmonary.  Ambulatory referral to rheumatology placed in chart/orders.  Pulmonary following and appreciate input and recommendations.  2.  Lower extremity edema Questionable etiology.  Patient with no history of cirrhosis and CT abdomen and pelvis consistent with hepatic steatosis.  Patient with no signs of nephrotic syndrome at this time.  Urinalysis negative for proteinuria.  Patient with low albumin levels.  Placed on Ensure nutritional supplementation.  Patient was started on Lasix prior to admission.  Lasix on hold due to worsening renal function..  Lower extremity edema seem to have improved on examination this morning with elevation..  Lower extremity Dopplers negative for DVT.  2D echo with normal EF and no wall motion abnormalities.  Outpatient follow-up.   3.  Well-controlled diabetes mellitus type 2 Hemoglobin A1c 6.2.  CBG of 229 this morning likely secondary to IV steroids.  Increase Lantus to 18 units daily.  Place on meal coverage NovoLog 4 units 3 times daily with meals.  Continue sliding scale insulin.  Monitor CBGs with steroid taper.  4.  Acute kidney injury Questionable etiology.  Concern for prerenal azotemia in the setting of recent diuretic use and ARB.  ARB and diuretics on hold.  Renal function improved.   5.  Hypertension Blood pressure stable.  ARB on hold secondary to acute kidney injury.  Follow for now.   6.  Coronary artery disease Currently stable.   Continue aspirin.  Statin on hold secondary to elevated LFTs.  CT abdomen and pelvis with concerns for hepatic steatosis.  LFTs trending back up.  Continue to hold statin.  Follow.  7.  Anemia/low vitamin B12 No overt bleeding.  Anemia panel consistent with anemia of chronic disease.  Patient with low vitamin B12 levels.  Hemoglobin stable at 11.2.  Patient placed on vitamin B12 1000 MCG subcutaneous daily and could likely transition to oral vitamin B12 supplementation on discharge.  Outpatient follow-up.  8.  Transaminitis Patient with worsening LFTs.  CT abdomen and pelvis with concerns for hepatic steatosis.  Acute hepatitis panel is negative.  Patient with no history of cirrhosis.  Patient with a history of alpha-1 antitrypsin per wife.  Patient's BUN is elevated and concerns for possible dehydration.  Gentle hydration however LFTs still trending back up.  Discontinue IV fluids.  Autoimmune panel pending.  Patient was on antibiotics prior to admission.  Due to worsening LFTs will consult with gastroenterology for further evaluation and management.     DVT prophylaxis: Lovenox Code Status: Full Family Communication: Updated patient and wife at bedside. Disposition Plan: Likely home with home health services when okay with gastroenterology hopefully tomorrow.    Consultants:   Pulmonary: Dr. Nelda Marseille  Gastroenterology pending.  Procedures:  CT chest/CT abdomen and pelvis 04/01/2019  Chest x-ray 04/01/2019  Lower extremity Dopplers 04/02/2019  Right upper quadrant ultrasound 04/01/2019  2D echo 04/04/2019  Antimicrobials:   IV Rocephin 04/01/2019>>>>> 04/02/2019  IV azithromycin 04/01/2019 >>>>>> 04/02/2019   Subjective: Patient laying in bed.  States muscle pain improving daily.  Denies any chest pain.  No shortness of breath.  Feeling better daily.  Concern his blood sugars are elevated.  No abdominal pain.  Lower extremity edema improved.   Objective: Vitals:   04/04/19 1203 04/04/19  2003 04/05/19 0459 04/05/19 0840  BP: (!) 165/75 (!) 163/74 (!) 168/77 (!) 164/103  Pulse: (!) 53 68 60 62  Resp: 18 15 16 18   Temp: (!) 97.4 F (36.3 C) 97.7 F (36.5 C) (!) 97.5 F (36.4 C) 97.6 F (36.4 C)  TempSrc: Oral Oral Oral Oral  SpO2: 98% 97% 96% 98%  Weight:   84.2 kg   Height:        Intake/Output Summary (Last 24 hours) at 04/05/2019 1038 Last data filed at 04/05/2019 0844 Gross per 24 hour  Intake 2304.41 ml  Output 3020 ml  Net -715.59 ml   Filed Weights   04/03/19 0550 04/04/19 0454 04/05/19 0459  Weight: 82.2 kg 82.9 kg 84.2 kg    Examination:  General exam: NAD. Respiratory system: Scattered coarse fine breath sounds.  No wheezing, no crackles.  Speaking in full sentences.  Respiratory effort normal. Cardiovascular system: Regular rate rhythm no murmurs rubs or gallops.  No JVD.  No lower extremity edema.  Gastrointestinal system: Abdomen is soft, nontender, nondistended, positive bowel sounds.  No rebound.  No guarding.  Central nervous system: Alert and oriented. No focal neurological deficits. Extremities: Symmetric 5 x 5 power. Skin: No rashes, lesions or ulcers Psychiatry: Judgement and insight appear normal. Mood & affect appropriate.     Data Reviewed: I have personally reviewed following labs and imaging studies  CBC: Recent Labs  Lab 04/01/19 1236 04/01/19 2309 04/02/19 0308 04/03/19 0544 04/04/19 0617 04/05/19 0430  WBC 17.6* 19.4* 17.9* 16.2* 19.8* 15.5*  NEUTROABS 13.2*  --  14.3* 14.4*  --  13.1*  HGB 11.6* 10.8* 10.4* 11.2* 10.3* 10.3*  HCT 35.3* 32.3* 31.4* 34.2* 31.7* 31.0*  MCV 88.9 85.9 87.5 86.4 86.8 87.6  PLT 554* 451* 440* 436* 472* 664*   Basic Metabolic Panel: Recent Labs  Lab 04/01/19 1236 04/01/19 2309 04/02/19 0308 04/03/19 0544 04/04/19 0617 04/05/19 0430  NA 132*  --  132* 131* 132* 132*  K 4.8  --  4.2 4.6 5.1 4.8  CL 98  --  98 99 101 101  CO2 23  --  23 21* 22 23  GLUCOSE 140*  --  129* 272* 269*  307*  BUN 22  --  22 25* 33* 34*  CREATININE 1.49* 1.41* 1.26* 1.04 1.26* 1.16  CALCIUM 8.5*  --  7.8* 8.4* 8.2* 8.0*  MG  --   --   --   --   --  2.0   GFR: Estimated Creatinine Clearance: 52.8 mL/min (by C-G formula based on SCr of 1.16 mg/dL). Liver Function Tests: Recent Labs  Lab 04/01/19 1236 04/02/19 0308 04/04/19 0617 04/05/19 0430  AST 75* 42* 142* 163*  ALT 67* 47* 111* 190*  ALKPHOS 198* 167* 160* 150*  BILITOT 0.7 0.9 0.4 0.6  PROT 6.3* 5.3* 5.8* 5.5*  ALBUMIN 2.3* 1.8* 2.1* 2.0*   No results for input(s): LIPASE, AMYLASE in the last 168 hours. No  results for input(s): AMMONIA in the last 168 hours. Coagulation Profile: No results for input(s): INR, PROTIME in the last 168 hours. Cardiac Enzymes: Recent Labs  Lab 04/02/19 1617  CKTOTAL 31*   BNP (last 3 results) No results for input(s): PROBNP in the last 8760 hours. HbA1C: Recent Labs    04/03/19 0544  HGBA1C 6.2*   CBG: Recent Labs  Lab 04/04/19 0544 04/04/19 1118 04/04/19 1529 04/04/19 2215 04/05/19 0628  GLUCAP 259* 205* 235* 351* 229*   Lipid Profile: No results for input(s): CHOL, HDL, LDLCALC, TRIG, CHOLHDL, LDLDIRECT in the last 72 hours. Thyroid Function Tests: No results for input(s): TSH, T4TOTAL, FREET4, T3FREE, THYROIDAB in the last 72 hours. Anemia Panel: Recent Labs    04/03/19 0544  VITAMINB12 341  FOLATE 15.4  FERRITIN 429*  TIBC 164*  IRON 27*   Sepsis Labs: Recent Labs  Lab 04/01/19 1610 04/01/19 2114 04/01/19 2309 04/02/19 0022 04/02/19 0308  PROCALCITON  --   --  <0.10  --   --   LATICACIDVEN 1.8 2.5*  --  1.9 1.6    Recent Results (from the past 240 hour(s))  Culture, blood (Routine X 2) w Reflex to ID Panel     Status: None (Preliminary result)   Collection Time: 04/01/19  2:17 PM   Specimen: BLOOD  Result Value Ref Range Status   Specimen Description BLOOD RIGHT ANTECUBITAL  Final   Special Requests   Final    BOTTLES DRAWN AEROBIC AND ANAEROBIC  Blood Culture adequate volume   Culture   Final    NO GROWTH 3 DAYS Performed at [facility], [place] [address] [place], [place] [address]    Report Status PENDING  Incomplete  SARS Coronavirus 2 [facility] order, Performed in [facility] hospital lab) Nasopharyngeal Nasopharyngeal Swab     Status: None   Collection Time: 04/01/19  2:30 PM   Specimen: Nasopharyngeal Swab  Result Value Ref Range Status   SARS Coronavirus 2 NEGATIVE NEGATIVE Final    Comment: (NOTE) If result is NEGATIVE SARS-CoV-2 target nucleic acids are NOT DETECTED. The SARS-CoV-2 RNA is generally detectable in upper and lower  respiratory specimens during the acute phase of infection. The lowest  concentration of SARS-CoV-2 viral copies this assay can detect is 250  copies / mL. A negative result does not preclude SARS-CoV-2 infection  and should not be used as the sole basis for treatment or other  patient management decisions.  A negative result may occur with  improper specimen collection / handling, submission of specimen other  than nasopharyngeal swab, presence of viral mutation(s) within the  areas targeted by this assay, and inadequate number of viral copies  (<250 copies / mL). A negative result must be combined with clinical  observations, patient history, and epidemiological information. If result is POSITIVE SARS-CoV-2 target nucleic acids are DETECTED. The SARS-CoV-2 RNA is generally detectable in upper and lower  respiratory specimens dur ing the acute phase of infection.  Positive  results are indicative of active infection with SARS-CoV-2.  Clinical  correlation with patient history and other diagnostic information is  necessary to determine patient infection status.  Positive results do  not rule out bacterial infection or co-infection with other viruses. If result is PRESUMPTIVE POSTIVE SARS-CoV-2 nucleic acids MAY BE PRESENT.   A presumptive positive result was obtained on the submitted  specimen  and confirmed on repeat testing.  While 2019 novel coronavirus  (SARS-CoV-2) nucleic acids may be present in  the submitted sample  additional confirmatory testing may be necessary for epidemiological  and / or clinical management purposes  to differentiate between  SARS-CoV-2 and other Sarbecovirus currently known to infect humans.  If clinically indicated additional testing with an alternate test  methodology [phone]) is advised. The SARS-CoV-2 RNA is generally  detectable in upper and lower respiratory sp ecimens during the acute  phase of infection. The expected result is Negative. Fact Sheet for Patients:  StrictlyIdeas.no Fact Sheet for Healthcare Providers: BankingDealers.co.za This test is not yet approved or cleared by the [place] FDA and has been authorized for detection and/or diagnosis of SARS-CoV-2 by FDA under an Emergency Use Authorization (EUA).  This EUA will remain in effect (meaning this test can be used) for the duration of the COVID-19 declaration under Section 564(b)(1) of the Act, 21 U.S.C. section 360bbb-3(b)(1), unless the authorization is terminated or revoked sooner. Performed at [facility], [place] [address], [place], [place] [address]   Culture, blood (Routine X 2) w Reflex to ID Panel     Status: None (Preliminary result)   Collection Time: 04/01/19  2:47 PM   Specimen: BLOOD RIGHT HAND  Result Value Ref Range Status   Specimen Description BLOOD RIGHT HAND  Final   Special Requests   Final    BOTTLES DRAWN AEROBIC AND ANAEROBIC Blood Culture results may not be optimal due to an inadequate volume of blood received in culture bottles   Culture   Final    NO GROWTH 3 DAYS Performed at [facility], [place] [address], [place], [place] [address]    Report Status PENDING  Incomplete  Respiratory Panel by PCR     Status: None   Collection Time: 04/02/19  4:15 PM   Specimen:  Nasopharyngeal Swab; Respiratory  Result Value Ref Range Status   Adenovirus NOT DETECTED NOT DETECTED Final   Coronavirus 229E NOT DETECTED NOT DETECTED Final    Comment: (NOTE) The Coronavirus on the Respiratory Panel, DOES NOT test for the novel  Coronavirus (2019 nCoV)    Coronavirus HKU1 NOT DETECTED NOT DETECTED Final   Coronavirus NL63 NOT DETECTED NOT DETECTED Final   Coronavirus OC43 NOT DETECTED NOT DETECTED Final   Metapneumovirus NOT DETECTED NOT DETECTED Final   Rhinovirus / Enterovirus NOT DETECTED NOT DETECTED Final   Influenza A NOT DETECTED NOT DETECTED Final   Influenza B NOT DETECTED NOT DETECTED Final   Parainfluenza Virus 1 NOT DETECTED NOT DETECTED Final   Parainfluenza Virus 2 NOT DETECTED NOT DETECTED Final   Parainfluenza Virus 3 NOT DETECTED NOT DETECTED Final   Parainfluenza Virus 4 NOT DETECTED NOT DETECTED Final   Respiratory Syncytial Virus NOT DETECTED NOT DETECTED Final   Bordetella pertussis NOT DETECTED NOT DETECTED Final   Chlamydophila pneumoniae NOT DETECTED NOT DETECTED Final   Mycoplasma pneumoniae NOT DETECTED NOT DETECTED Final    Comment: Performed at [facility], [place]. [address], [place], [place] [address]         Radiology Studies: No results found.      Scheduled Meds: . aspirin  324 mg Oral Daily  . cyanocobalamin  1,000 mcg Subcutaneous Daily  . enoxaparin (LOVENOX) injection  40 mg Subcutaneous Q24H  . feeding supplement (ENSURE ENLIVE)  237 mL Oral BID BM  . guaiFENesin  1,200 mg Oral BID  . insulin aspart  0-20 Units Subcutaneous TID WC  . insulin aspart  4 Units Subcutaneous TID WC  . insulin  glargine  18 Units Subcutaneous Daily  . methylPREDNISolone (SOLU-MEDROL) injection  40 mg Intravenous Q6H  . omega-3 acid ethyl esters  2 g Oral Daily  . pantoprazole  40 mg Oral Daily  . sodium chloride flush  3 mL Intravenous Once  . traZODone  100 mg Oral QHS   Continuous Infusions:    LOS: 2 days    Time  spent: 35 minutes    Irine Seal, MD [facility]  If 7PM-7AM, please contact night-coverage [email or web address] 04/05/2019, 10:38 AM

## 2019-04-05 NOTE — Plan of Care (Signed)

## 2019-04-06 LAB — CBC WITH DIFFERENTIAL/PLATELET
Abs Immature Granulocytes: 1.53 10*3/uL — ABNORMAL HIGH (ref 0.00–0.07)
Basophils Absolute: 0.1 10*3/uL (ref 0.0–0.1)
Basophils Relative: 0 %
Eosinophils Absolute: 0 10*3/uL (ref 0.0–0.5)
Eosinophils Relative: 0 %
HCT: 32.1 % — ABNORMAL LOW (ref 39.0–52.0)
Hemoglobin: 10.8 g/dL — ABNORMAL LOW (ref 13.0–17.0)
Immature Granulocytes: 9 %
Lymphocytes Relative: 12 %
Lymphs Abs: 2 10*3/uL (ref 0.7–4.0)
MCH: 29.3 pg (ref 26.0–34.0)
MCHC: 33.6 g/dL (ref 30.0–36.0)
MCV: 87.2 fL (ref 80.0–100.0)
Monocytes Absolute: 1.1 10*3/uL — ABNORMAL HIGH (ref 0.1–1.0)
Monocytes Relative: 7 %
Neutro Abs: 11.9 10*3/uL — ABNORMAL HIGH (ref 1.7–7.7)
Neutrophils Relative %: 72 %
Platelets: 539 10*3/uL — ABNORMAL HIGH (ref 150–400)
RBC: 3.68 MIL/uL — ABNORMAL LOW (ref 4.22–5.81)
RDW: 12.9 % (ref 11.5–15.5)
WBC: 16.6 10*3/uL — ABNORMAL HIGH (ref 4.0–10.5)
nRBC: 0.2 % (ref 0.0–0.2)

## 2019-04-06 LAB — PROTIME-INR
INR: 1.3 — ABNORMAL HIGH (ref 0.8–1.2)
Prothrombin Time: 15.7 seconds — ABNORMAL HIGH (ref 11.4–15.2)

## 2019-04-06 LAB — CULTURE, BLOOD (ROUTINE X 2)
Culture: NO GROWTH
Culture: NO GROWTH
Special Requests: ADEQUATE

## 2019-04-06 LAB — HEPATIC FUNCTION PANEL
ALT: 205 U/L — ABNORMAL HIGH (ref 0–44)
AST: 114 U/L — ABNORMAL HIGH (ref 15–41)
Albumin: 2.1 g/dL — ABNORMAL LOW (ref 3.5–5.0)
Alkaline Phosphatase: 147 U/L — ABNORMAL HIGH (ref 38–126)
Bilirubin, Direct: <0.1 mg/dL (ref 0.0–0.2)
Total Bilirubin: 0.4 mg/dL (ref 0.3–1.2)
Total Protein: 5.3 g/dL — ABNORMAL LOW (ref 6.5–8.1)

## 2019-04-06 LAB — BASIC METABOLIC PANEL
Anion gap: 7 (ref 5–15)
BUN: 34 mg/dL — ABNORMAL HIGH (ref 8–23)
CO2: 26 mmol/L (ref 22–32)
Calcium: 8.1 mg/dL — ABNORMAL LOW (ref 8.9–10.3)
Chloride: 100 mmol/L (ref 98–111)
Creatinine, Ser: 1.13 mg/dL (ref 0.61–1.24)
GFR calc Af Amer: >60 mL/min (ref >60)
GFR calc non Af Amer: >60 mL/min (ref >60)
Glucose, Bld: 193 mg/dL — ABNORMAL HIGH (ref 70–99)
Potassium: 4.3 mmol/L (ref 3.5–5.1)
Sodium: 133 mmol/L — ABNORMAL LOW (ref 135–145)

## 2019-04-06 LAB — GLUCOSE, CAPILLARY
Glucose-Capillary: 129 mg/dL — ABNORMAL HIGH (ref 70–99)
Glucose-Capillary: 226 mg/dL — ABNORMAL HIGH (ref 70–99)
Glucose-Capillary: 225 mg/dL — ABNORMAL HIGH (ref 70–99)

## 2019-04-06 MED ORDER — PREDNISONE 10 MG PO TABS: ORAL_TABLET | ORAL | 0 refills | Status: DC

## 2019-04-06 MED ORDER — METFORMIN HCL 500 MG PO TABS: 500.0000 mg | ORAL_TABLET | Freq: Two times a day (BID) | ORAL | 0 refills | Status: DC

## 2019-04-06 MED ORDER — GLIPIZIDE 5 MG PO TABS: 5.0000 mg | ORAL_TABLET | Freq: Two times a day (BID) | ORAL | 0 refills | Status: DC

## 2019-04-06 MED ORDER — VITAMIN B-12 1000 MCG PO TABS: 1000.0000 ug | ORAL_TABLET | Freq: Every day | ORAL | 0 refills | Status: DC

## 2019-04-06 NOTE — Progress Notes (Signed)
Daily Rounding Note  04/06/2019, 9:19 AM  LOS: 3 days   SUBJECTIVE:   Chief complaint: Elevated LFTs.     Pt feels great.  Ready to go home if we let him  OBJECTIVE:         Vital signs in last 24 hours:    Temp:  [97.6 F (36.4 C)-97.8 F (36.6 C)] 97.8 F (36.6 C) (08/12 0831) Pulse Rate:  [55-67] 59 (08/12 0831) Resp:  [1-20] 20 (08/12 0831) BP: (139-170)/(68-81) 139/68 (08/12 0831) SpO2:  [95 %-99 %] 99 % (08/12 0831) Weight:  [83.7 kg] 83.7 kg (08/12 0354) Last BM Date: 04/04/19 Filed Weights   04/04/19 0454 04/05/19 0459 04/06/19 0354  Weight: 82.9 kg 84.2 kg 83.7 kg   General: looks well   Heart: RRR Chest: clear Abdomen: soft, nt, nd.  bs active  Extremities: no CCE Neuro/Psych:  No confusion.  Speech fluid.  Walking in hall with minor assistance  Intake/Output from previous day: 08/11 0701 - 08/12 0700 In: 630 [P.O.:480] Out: 3675 [Urine:3675]  Intake/Output this shift: No intake/output data recorded.  Lab Results: Recent Labs    04/04/19 0617 04/05/19 0430 04/06/19 0502  WBC 19.8* 15.5* 16.6*  HGB 10.3* 10.3* 10.8*  HCT 31.7* 31.0* 32.1*  PLT 472* 489* 539*   BMET Recent Labs    04/04/19 0617 04/05/19 0430 04/06/19 0502  NA 132* 132* 133*  K 5.1 4.8 4.3  CL 101 101 100  CO2 '22 23 26  ' GLUCOSE 269* 307* 193*  BUN 33* 34* 34*  CREATININE 1.26* 1.16 1.13  CALCIUM 8.2* 8.0* 8.1*   LFT Recent Labs    04/04/19 0617 04/05/19 0430 04/06/19 0502  PROT 5.8* 5.5* 5.3*  ALBUMIN 2.1* 2.0* 2.1*  AST 142* 163* 114*  ALT 111* 190* 205*  ALKPHOS 160* 150* 147*  BILITOT 0.4 0.6 0.4  BILIDIR  --  0.1 <0.1  IBILI  --  0.5 NOT CALCULATED   PT/INR Recent Labs    04/06/19 0502  LABPROT 15.7*  INR 1.3*   Hepatitis Panel Recent Labs    04/04/19 0617  HEPBSAG Negative  HCVAB <0.1  HEPAIGM Negative  HEPBIGM Negative    Scheduled Meds: . aspirin  324 mg Oral Daily  .  cyanocobalamin  1,000 mcg Subcutaneous Daily  . enoxaparin (LOVENOX) injection  40 mg Subcutaneous Q24H  . feeding supplement (ENSURE ENLIVE)  237 mL Oral BID BM  . guaiFENesin  1,200 mg Oral BID  . insulin aspart  0-20 Units Subcutaneous TID WC  . insulin aspart  4 Units Subcutaneous TID WC  . insulin glargine  18 Units Subcutaneous Daily  . omega-3 acid ethyl esters  2 g Oral Daily  . pantoprazole  40 mg Oral Daily  . predniSONE  30 mg Oral QAC breakfast  . sodium chloride flush  3 mL Intravenous Once  . traZODone  100 mg Oral QHS   Continuous Infusions: PRN Meds:.acetaminophen **OR** acetaminophen, hydrALAZINE, nitroGLYCERIN, ondansetron **OR** ondansetron (ZOFRAN) IV   ASSESMENT:   *   Elevated LFTs, date back to at least 02/12/2019 when his alk phos was elevated.  Alk phos improved, transaminases rising.  T bili never elevated. Imaging (CT, ultrasound) reveals hepatic steatosis, possible hepatocellular disease, no gallstones/GB disease/ductal dilatation. Known alpha 1 antitrypsin deficiency. ?  Due to med/antibiotic DILI.  ? Due to previously asymptomatic A1 AT deficiency? Due to  Polymyositis. Fortunately LFTs are trending down for the most  part.  *   Mild coagulopathy.  No priors to comp.    *    Interstitial pneumonitis, polymyositis.  Improving with steroids.  *    Alpha-1 antitrypsin.  Previously described as asymptomatic.  *    Hx colitis, non-specific vs IBD. No colitis meds now or in past.  No diarrhea or abd pain.    *   Hx adenomatous colon polyps colonoscopy 2003, 2004.  Last colonoscopy 2004  *    GERD.   Chronic PPI at home.    *   NIDDM.    *   CAD.  Previous cardiac stent, date unknown.  Extent of dz not charted. 83 ASA at home.    PLAN   *  hospitalist wondering if he can go home.  Will need fup LFTs in the nest several days and should dup with Dr Lyndel Safe in 3 to 4 weeks.  Asked Dr Eliseo Squires to speack with Dr C to see if ok to go home.      Azucena Freed  04/06/2019, 9:19 AM Phone [phone]

## 2019-04-06 NOTE — Evaluation (Addendum)
Occupational Therapy Evaluation and Discharge Patient Details Name: Kerry Matthews MRN: [id number] DOB: [birth year] Today's Date: 04/06/2019    History of Present Illness 83 y.o. male with history of CAD, hypertension, diabetes mellitus presents to the ER because of ongoing fever chills nonproductive cough and shortness of breath and LE edema for the last 10 days. Chest CT reveals interstitial lung disease. Admitted 04/01/19 for treatment of mild pneumonitis and polymyositis. Noted to have elevated liver enzymes which is new. As of 11/2018 reported walking a mile 3-4x/wk.    Clinical Impression   PTA patient independent. Admitted for above and limited by problem list below, including impaired activity tolerance and decreased endurance.  Patient currently demonstrates ability to complete ADLs with supervision, transfers with supervision and in room mobility with supervision.  Reviewed energy conservation techniques, ADL compensatory techniques, recommendations and safety.  Patient agreeable to shower safety recommendations, and he plans to have his son install grab bars for him.  Based on performance today, no further OT needs identified and OT will sign off.      Follow Up Recommendations  No OT follow up;Supervision - Intermittent    Equipment Recommendations  Other (comment)(grabbars for shower- pt plans to install)    Recommendations for Other Services       Precautions / Restrictions Precautions Precautions: None Restrictions Weight Bearing Restrictions: No      Mobility Bed Mobility Overal bed mobility: Modified Independent             General bed mobility comments: OOB upon entry in recliner   Transfers Overall transfer level: Needs assistance Equipment used: None Transfers: Sit to/from Stand Sit to Stand: Supervision         General transfer comment: supervision for safety    Balance Overall balance assessment: Mild deficits observed, not formally tested                                          ADL either performed or assessed with clinical judgement   ADL Overall ADL's : Needs assistance/impaired     Grooming: Standing;Modified independent   Upper Body Bathing: Sitting;Supervision/ safety   Lower Body Bathing: Supervison/ safety;Sit to/from stand   Upper Body Dressing : Supervision/safety;Sitting;Set up   Lower Body Dressing: Supervision/safety;Set up;Sit to/from stand   Toilet Transfer: Supervision/safety;Ambulation Toilet Transfer Details (indicate cue type and reason): simulated in room     Tub/ Shower Transfer: Supervision/safety;Tub transfer;Ambulation;Grab bars Tub/Shower Transfer Details (indicate cue type and reason): reviewed techniques for safety, pt agreeable with recommendations for grabbars to be installed at home  Functional mobility during ADLs: Supervision/safety General ADL Comments: pt limited by decreased act tolerance     Vision         Perception     Praxis      Pertinent Vitals/Pain Pain Assessment: No/denies pain     Hand Dominance     Extremity/Trunk Assessment Upper Extremity Assessment Upper Extremity Assessment: Overall WFL for tasks assessed   Lower Extremity Assessment Lower Extremity Assessment: Defer to PT evaluation   Cervical / Trunk Assessment Cervical / Trunk Assessment: Normal   Communication Communication Communication: No difficulties   Cognition Arousal/Alertness: Awake/alert Behavior During Therapy: WFL for tasks assessed/performed Overall Cognitive Status: Within Functional Limits for tasks assessed  General Comments  VSS, reviewed energy conservation techniques and safety     Exercises     Shoulder Instructions      Home Living Family/patient expects to be discharged to:: Private residence Living Arrangements: Spouse/significant other Available Help at Discharge: Family;Available  PRN/intermittently Type of Home: House Home Access: Stairs to enter [employer] of Steps: 3 Entrance Stairs-Rails: Left Home Layout: One level     Bathroom Shower/Tub: Tub/shower unit;Walk-in shower   Bathroom Toilet: Handicapped height Bathroom Accessibility: Yes   Home Equipment: Cane - single point          Prior Functioning/Environment Level of Independence: Independent        Comments: driving, 2/64 walking 1 mile 3-4/wk, indepednent ADLs/IADLs        OT Problem List: Decreased activity tolerance;Cardiopulmonary status limiting activity;Decreased knowledge of use of DME or AE      OT Treatment/Interventions:      OT Goals(Current goals can be found in the care plan section) Acute Rehab OT Goals Patient Stated Goal: get back to walking 1 mile for exercise  OT Goal Formulation: With patient  OT Frequency:     Barriers to D/C:            Co-evaluation              AM-PAC OT "6 Clicks" Daily Activity     Outcome Measure Help from another person eating meals?: None Help from another person taking care of personal grooming?: None Help from another person toileting, which includes using toliet, bedpan, or urinal?: A Little Help from another person bathing (including washing, rinsing, drying)?: A Little Help from another person to put on and taking off regular upper body clothing?: None Help from another person to put on and taking off regular lower body clothing?: A Little 6 Click Score: 21   End of Session Nurse Communication: Mobility status  Activity Tolerance: Patient tolerated treatment well Patient left: in chair;with call bell/phone within reach  OT Visit Diagnosis: Other (comment)(decreased activity tolerance)                Time: 1583-0940 OT Time Calculation (min): 15 min Charges:  OT General Charges $OT Visit: 1 Visit OT Evaluation $OT Eval Low Complexity: 1 Low  Delight Stare, OT Acute Rehabilitation Services Pager  [phone] Office [phone]   Delight Stare 04/06/2019, 10:33 AM

## 2019-04-06 NOTE — Evaluation (Signed)
Physical Therapy Evaluation Patient Details Name: Kerry Matthews MRN: [id number] DOB: [birth year] Today's Date: 04/06/2019   History of Present Illness  83 y.o. male with history of CAD, hypertension, diabetes mellitus presents to the ER because of ongoing fever chills nonproductive cough and shortness of breath and LE edema for the last 10 days. Chest CT reveals interstitial lung disease. Admitted 04/01/19 for treatment of mild pneumonitis and polymyositis. Noted to have elevated liver enzymes which is new. As of 11/2018 reported walking a mile 3-4x/wk.   Clinical Impression  PTA pt lives with wife in single story home with 3 steps to enter, independent in ADLs and iADLs, driving to appointments. Over the last 10 days mobility has been increasingly more difficult. Pt is limited in safe mobility by decreased endurance. Pt is currently mod I for bed mobility, supervision for transfers and ambulation of 450 feet without RW, and min guard for ascent/descent of 6 steps. PT recommends no PT follow up at discharge, however will continue to work with pt acutely to build endurance back to baseline.    Follow Up Recommendations No PT follow up;Supervision for mobility/OOB    Equipment Recommendations  None recommended by PT       Precautions / Restrictions Precautions Precautions: None Restrictions Weight Bearing Restrictions: No      Mobility  Bed Mobility Overal bed mobility: Modified Independent             General bed mobility comments: HoB elevated, and use of bed rail to pull to EoB  Transfers Overall transfer level: Needs assistance Equipment used: None Transfers: Sit to/from Stand Sit to Stand: Supervision         General transfer comment: supervision for safety  Ambulation/Gait Ambulation/Gait assistance: Supervision Gait Distance (Feet): 450 Feet Assistive device: None Gait Pattern/deviations: WFL(Within Functional Limits);Step-through pattern Gait velocity: WFL Gait  velocity interpretation: >2.62 ft/sec, indicative of community ambulatory General Gait Details: supervision for strong, steady gait  Stairs Stairs: Yes Stairs assistance: Min guard Stair Management: One rail Right;Forwards;Alternating pattern Number of Stairs: 6 General stair comments: min guard for safety with strong, steady ascent/descent         Balance Overall balance assessment: Mild deficits observed, not formally tested                                           Pertinent Vitals/Pain Pain Assessment: No/denies pain    Home Living Family/patient expects to be discharged to:: Private residence Living Arrangements: Spouse/significant other Available Help at Discharge: Family;Available PRN/intermittently Type of Home: House Home Access: Stairs to enter Entrance Stairs-Rails: Left Entrance Stairs-Number of Steps: 3 Home Layout: One level Home Equipment: Cane - single point      Prior Function Level of Independence: Independent         Comments: driving, 2/62 walking 1 mile 3-4/wk        Extremity/Trunk Assessment   Upper Extremity Assessment Upper Extremity Assessment: Overall WFL for tasks assessed    Lower Extremity Assessment Lower Extremity Assessment: Overall WFL for tasks assessed    Cervical / Trunk Assessment Cervical / Trunk Assessment: Normal  Communication   Communication: No difficulties  Cognition Arousal/Alertness: Awake/alert Behavior During Therapy: WFL for tasks assessed/performed Overall Cognitive Status: Within Functional Limits for tasks assessed  General Comments General comments (skin integrity, edema, etc.): VSS, 2/4 DoE with ambulation and gait training pt states breathing is"so much better than before."        Assessment/Plan    PT Assessment Patient needs continued PT services  PT Problem List Decreased activity tolerance       PT Treatment  Interventions DME instruction;Gait training;Stair training;Functional mobility training;Therapeutic activities;Therapeutic exercise;Balance training;Cognitive remediation;Patient/family education    PT Goals (Current goals can be found in the Care Plan section)  Acute Rehab PT Goals Patient Stated Goal: get back to walking 1 mile for exercise  PT Goal Formulation: With patient Time For Goal Achievement: 04/20/19 Potential to Achieve Goals: Good    Frequency Min 2X/week    AM-PAC PT "6 Clicks" Mobility  Outcome Measure Help needed turning from your back to your side while in a flat bed without using bedrails?: None Help needed moving from lying on your back to sitting on the side of a flat bed without using bedrails?: None Help needed moving to and from a bed to a chair (including a wheelchair)?: None Help needed standing up from a chair using your arms (e.g., wheelchair or bedside chair)?: None Help needed to walk in hospital room?: None Help needed climbing 3-5 steps with a railing? : A Little 6 Click Score: 23    End of Session Equipment Utilized During Treatment: Gait belt Activity Tolerance: Patient tolerated treatment well Patient left: in chair;with call bell/phone within reach;with chair alarm set Nurse Communication: Mobility status PT Visit Diagnosis: Difficulty in walking, not elsewhere classified (R26.2)    Time: 8676-7209 PT Time Calculation (min) (ACUTE ONLY): 13 min   Charges:   PT Evaluation $PT Eval Moderate Complexity: 1 Mod          Freddi Schrager B. Migdalia Dk PT, DPT Acute Rehabilitation Services Pager [phone] Office [phone]   [place] 04/06/2019, 9:47 AM

## 2019-04-06 NOTE — Progress Notes (Signed)
Patient given discharge instructions verbalized understanding. PIV removed with catheter intact. Dr.Van called and talked with Grand daughter regarding discharge instructions she has agreed to assist patient with monitoring his blood gluclose at home and if he requires insulin reported would assist with teaching. Patient's grand daughter is a Therapist, sports employed here at the hospital and has permission of patient to talk with Dr.Vann regarding his dischange instuctions.  Patient's wife is present but tells Probation officer is forgetful can't remember all that need to call Nira Conn talk with her so Probation officer talked with her as well.

## 2019-04-06 NOTE — Plan of Care (Signed)
  Problem: Education: Goal: Knowledge of General Education information will improve Description: Including pain rating scale, medication(s)/side effects and non-pharmacologic comfort measures 04/06/2019 1805 by Arlina Robes, RN Outcome: Adequate for Discharge 04/06/2019 1225 by Arlina Robes, RN Outcome: Progressing   Problem: Health Behavior/Discharge Planning: Goal: Ability to manage health-related needs will improve 04/06/2019 1805 by Arlina Robes, RN Outcome: Adequate for Discharge 04/06/2019 1225 by Arlina Robes, RN Outcome: Progressing   Problem: Clinical Measurements: Goal: Ability to maintain clinical measurements within normal limits will improve 04/06/2019 1805 by Arlina Robes, RN Outcome: Adequate for Discharge 04/06/2019 1225 by Arlina Robes, RN Outcome: Progressing

## 2019-04-06 NOTE — Plan of Care (Signed)

## 2019-04-06 NOTE — Discharge Summary (Signed)
Physician Discharge Summary  Nikesh Teschner ZOX:[id number] DOB: [birth year] DOA: 04/01/2019  PCP: [name], MD  Admit date: 04/01/2019 Discharge date: 04/06/2019  Admitted From: home Discharge disposition: home   Recommendations for Outpatient Follow-Up:   1. Referral made to rheumatology 2. Will need GI and pulmonary follow up 3. Continue prednisone at 20 mg until after pulmonary follow up 4. Will need close monitoring of his blood sugars while on steroids 5. In 2 weeks needs repeat LFTs   Discharge Diagnosis:   Principal Problem:   Pneumonitis Active Problems:   Hypertension   Hyperlipidemia   CKD (chronic kidney disease)   Elevated LFTs   Diabetes mellitus type 2 in obese (HCC)   Community acquired pneumonia   Cough   Fever   Abnormal CT of the chest   Lower extremity edema   AKI (acute kidney injury) (HCC)   Anemia   Low vitamin B12 level   Hypoxemia   Myositis   Transaminitis    Discharge Condition: Improved.  Diet recommendation: Low sodium, heart healthy.  Carbohydrate-modified.    Wound care: None.  Code status: Full.   History of Present Illness:   Kerry Matthews is a 83 y.o. male with history of CAD, hypertension, diabetes mellitus presents to the ER because of ongoing fever chills nonproductive cough and shortness of breath for the last 10 days.  Patient was placed on antibiotic which patient has been taking last week and has not found any changes in the fever pattern.  Patient's primary care physician advised to come to the ER.  Patient denies any nausea vomiting diarrhea abdominal pain dysuria or discharges.  Patient states over the last 10 days patient also had increasing lower extremity edema for which patient was placed on Lasix.   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 Questionable etiology.  Source of fever is  unknown.  Fever likely secondary to pneumonitis and concern for polymyositis.  Patient with ongoing fevers for approximately a month per patient and wife.  Patient tested at least 4 times for COVID-19 which was negative.  Patient has been treated empirically with oral antibiotics of azithromycin and Levaquin over the past 10 days with no significant clinical improvement.  Patient admitted and placed empirically on IV antibiotics for presumed pneumonia.  Patient however denies any significant worsening of shortness of breath.  Patient noted to have a cough with fevers and chills with complaints of fatigue.  Urinalysis done nitrite negative leukocytes negative.  Urine strep pneumococcus antigen is negative.  Chest x-ray done with chronic interstitial disease changes greater on the left and right with no acute abnormalities.  CT abdomen and pelvis with no acute process noted in the chest abdomen or pelvis.  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  Suspicion of mild hepatic steatosis.  Due to ongoing fevers, fatigue, abnormal CT chest with no significant improvement on outpatient antibiotics concern for possible pneumonitis.  Patient currently afebrile since admission.  Patient with a leukocytosis with a left shift which is trending down.  Lactic acid level has trended down.  Procalcitonin is less than 0.10.  White count now is 15.5 from 19.8 from 16.2 from 17.9 from 19.4.  Respiratory viral panel is negative.  Patient states diffuse muscle pain has improved significantly after being started on IV Solu-Medrol.  Patient afebrile.  Patient has been seen in consultation by pulmonary who feel  patient likely has a pneumonitis with possible ?? polymyositis.  Autoimmune panel has been ordered per pulmonary which shows a anti-proteinase 3 less than 3.5, anti-Jo 1 less than 0.2, CCP antibodies IgG of 6, DS DNA antibody less than 1, myeloperoxidase antibodies greater than 100.  CK  levels are low.  RA factor elevated at 95.4.   IV antibiotics have been discontinued.   Continued Solu-Medrol 40 mg IV every 6 hours as recommended by pulmonary for total of 3 days. - transitioned to oral prednisone 30 mg daily for 5 days then 20 mg  until outpatient follow-up with rheumatology and pulmonary per pulmonary recommendations...  .  2.  Lower extremity edema Questionable etiology.  Patient with no history of cirrhosis and CT abdomen and pelvis consistent with hepatic steatosis.  Patient with no signs of nephrotic syndrome at this time.  Urinalysis negative for proteinuria.  Patient with low albumin levels.  Placed on Ensure nutritional supplementation.  Patient was started on Lasix prior to admission.  -  Lower extremity edema seem to have improved on examination this morning with elevation..  Lower extremity Dopplers negative for DVT.  2D echo with normal EF and no wall motion abnormalities.  Outpatient follow-up.  ? Need for lasix  3.  Well-controlled diabetes mellitus type 2 Hemoglobin A1c 6.2.   -blood sugars higher due to steroids-- will increase metformin and start glipizide-- will need de-escalation as steroids are weaned off  4.  Acute kidney injury  Renal function improved.  -monitor closely and adjust ARB/lasix as needed  5.  Hypertension Blood pressure stable  6.  Coronary artery disease Currently stable.  Continue aspirin.  Statin on hold secondary to elevated LFTs.   7.  Anemia/low vitamin B12 -B12 replacement-- had gotten IM here-- continue PO at home  8.  Transaminitis Patient with worsening LFTs.  CT abdomen and pelvis with concerns for hepatic steatosis.  Acute hepatitis panel is negative.  Patient with no his - Autoimmune panel pending.   -appreciate GI consult: AST improved to 114 and ALT essentially stable at 205.  Likely hitting plateau at this point.  Otherwise feels well, tolerating p.o. intake and wants to go home.  -Repeat LAE's in 2 weeks to  ensure continued downtrend -May consider repeat alpha-1 antitrypsin with phenotype as outpatient -Okay to follow-up with Dr. Lyndel Safe in 3 to 4 weeks -Okay to discharge from a GI standpoint   Medical Consultants:      Discharge Exam:   Vitals:   04/06/19 0831 04/06/19 1122  BP: 139/68 (!) 130/58  Pulse: (!) 59 (!) 58  Resp: 20 16  Temp: 97.8 F (36.6 C) 97.8 F (36.6 C)  SpO2: 99% 94%   Vitals:   04/05/19 1949 04/06/19 0354 04/06/19 0831 04/06/19 1122  BP: (!) 149/69 (!) 170/69 139/68 (!) 130/58  Pulse: 63 (!) 55 (!) 59 (!) 58  Resp: 16 18 20 16   Temp: 97.6 F (36.4 C) 97.8 F (36.6 C) 97.8 F (36.6 C) 97.8 F (36.6 C)  TempSrc: Oral Oral Oral Oral  SpO2: 95% 97% 99% 94%  Weight:  83.7 kg    Height:        General exam: Appears calm and comfortable-- feels great and wants to go home    The results of significant diagnostics from this hospitalization (including imaging, microbiology, ancillary and laboratory) are listed below for reference.     Procedures and Diagnostic Studies:   Ct Chest W Contrast  Result Date: 04/01/2019 CLINICAL DATA:  Cough and fevers. Negative COVID-19 tests x2. Elevated liver function tests. Acute respiratory illness. EXAM: CT CHEST, ABDOMEN, AND PELVIS WITH CONTRAST TECHNIQUE: Multidetector CT imaging of the chest, abdomen and pelvis was performed following the standard protocol during bolus administration of intravenous contrast. CONTRAST:  155mL OMNIPAQUE IOHEXOL 300 MG/ML  SOLN COMPARISON:  Chest radiograph earlier today.  No comparison CTs. FINDINGS: CT CHEST FINDINGS Cardiovascular: Aortic atherosclerosis. Tortuous thoracic aorta. Borderline cardiomegaly, without pericardial effusion. Multivessel coronary artery atherosclerosis. Pulmonary artery enlargement, outflow tract 3.3 cm. No central pulmonary embolism, on this non-dedicated study. Mediastinum/Nodes: borderline enlarged right paratracheal node of 1.0 cm on image 27/3. A subcarinal node  measures 1.2 cm on 33/3. No hilar adenopathy. Lungs/Pleura: No pleural fluid. Diffuse but slightly basilar predominant subpleural reticulation with traction bronchiolectasis. This is slightly worse on the left. No areas of consolidation or ground-glass to suggest pneumonia. Musculoskeletal: No acute osseous abnormality. CT ABDOMEN PELVIS FINDINGS Hepatobiliary: Suspicion of mild hepatic steatosis. Normal gallbladder, without biliary ductal dilatation. Pancreas: Fatty atrophy throughout the pancreas. Spleen: Normal in size, without focal abnormality. Adrenals/Urinary Tract: Normal adrenal glands. Mild renal cortical thinning bilaterally. Bilateral renal sinus cysts, but no hydronephrosis. Mild bladder distension with minimal right-sided bladder at the origin of the right inguinal hernia. Example image 110/3 and coronal image 55. Stomach/Bowel: Normal stomach, without wall thickening. Scattered colonic diverticula. Normal terminal ileum. Normal small bowel. Vascular/Lymphatic: Aortic and branch vessel atherosclerosis. No abdominopelvic adenopathy. Reproductive: Normal prostate. Other: No significant free fluid. Right inguinal hernia contains fat and minimal urinary bladder. Musculoskeletal: Mild osteopenia. Convex right lumbar spine curvature. Lumbar spondylosis. IMPRESSION: 1. No acute process in the chest, abdomen, or pelvis. No acute explanation for patient's symptoms. 2. Interstitial lung disease, suspicious for early/mild usual interstitial pneumonitis (typically due to pulmonary fibrosis). Differential considerations include nonspecific interstitial pneumonitis. Depending on ongoing chronic clinical symptomatology, consider high-resolution chest CT at 6-12 months. 3. Suspicion of mild hepatic steatosis. 4. Coronary artery atherosclerosis. Aortic Atherosclerosis (ICD10-I70.0). 5. Pulmonary artery enlargement suggests pulmonary arterial hypertension. 6. Right inguinal hernia containing fat and minimal urinary  bladder at its origin. 7. Borderline thoracic adenopathy, favored to be reactive. Electronically Signed   By: Abigail Miyamoto M.D.   On: 04/01/2019 19:06   Ct Abdomen Pelvis W Contrast  Result Date: 04/01/2019 CLINICAL DATA:  Cough and fevers. Negative COVID-19 tests x2. Elevated liver function tests. Acute respiratory illness. EXAM: CT CHEST, ABDOMEN, AND PELVIS WITH CONTRAST TECHNIQUE: Multidetector CT imaging of the chest, abdomen and pelvis was performed following the standard protocol during bolus administration of intravenous contrast. CONTRAST:  163mL OMNIPAQUE IOHEXOL 300 MG/ML  SOLN COMPARISON:  Chest radiograph earlier today.  No comparison CTs. FINDINGS: CT CHEST FINDINGS Cardiovascular: Aortic atherosclerosis. Tortuous thoracic aorta. Borderline cardiomegaly, without pericardial effusion. Multivessel coronary artery atherosclerosis. Pulmonary artery enlargement, outflow tract 3.3 cm. No central pulmonary embolism, on this non-dedicated study. Mediastinum/Nodes: borderline enlarged right paratracheal node of 1.0 cm on image 27/3. A subcarinal node measures 1.2 cm on 33/3. No hilar adenopathy. Lungs/Pleura: No pleural fluid. Diffuse but slightly basilar predominant subpleural reticulation with traction bronchiolectasis. This is slightly worse on the left. No areas of consolidation or ground-glass to suggest pneumonia. Musculoskeletal: No acute osseous abnormality. CT ABDOMEN PELVIS FINDINGS Hepatobiliary: Suspicion of mild hepatic steatosis. Normal gallbladder, without biliary ductal dilatation. Pancreas: Fatty atrophy throughout the pancreas. Spleen: Normal in size, without focal abnormality. Adrenals/Urinary Tract: Normal adrenal glands. Mild renal cortical thinning bilaterally. Bilateral renal sinus cysts, but no hydronephrosis. Mild bladder  distension with minimal right-sided bladder at the origin of the right inguinal hernia. Example image 110/3 and coronal image 55. Stomach/Bowel: Normal stomach,  without wall thickening. Scattered colonic diverticula. Normal terminal ileum. Normal small bowel. Vascular/Lymphatic: Aortic and branch vessel atherosclerosis. No abdominopelvic adenopathy. Reproductive: Normal prostate. Other: No significant free fluid. Right inguinal hernia contains fat and minimal urinary bladder. Musculoskeletal: Mild osteopenia. Convex right lumbar spine curvature. Lumbar spondylosis. IMPRESSION: 1. No acute process in the chest, abdomen, or pelvis. No acute explanation for patient's symptoms. 2. Interstitial lung disease, suspicious for early/mild usual interstitial pneumonitis (typically due to pulmonary fibrosis). Differential considerations include nonspecific interstitial pneumonitis. Depending on ongoing chronic clinical symptomatology, consider high-resolution chest CT at 6-12 months. 3. Suspicion of mild hepatic steatosis. 4. Coronary artery atherosclerosis. Aortic Atherosclerosis (ICD10-I70.0). 5. Pulmonary artery enlargement suggests pulmonary arterial hypertension. 6. Right inguinal hernia containing fat and minimal urinary bladder at its origin. 7. Borderline thoracic adenopathy, favored to be reactive. Electronically Signed   By: Abigail Miyamoto M.D.   On: 04/01/2019 19:06   Dg Chest Port 1 View  Result Date: 04/01/2019 CLINICAL DATA:  Cough, shortness of breath and weakness for 3-4 months, COVID-19 like symptoms but 2 negative COVID-19 tests EXAM: PORTABLE CHEST 1 VIEW COMPARISON:  Portable exam 1527 hours compared to 03/25/2019 FINDINGS: Normal heart size, mediastinal contours, and pulmonary vascularity. Chronic interstitial lung disease at the periphery of the mid to lower lungs bilaterally greater on LEFT. No definite acute infiltrate, pleural effusion or pneumothorax. Bones demineralized. IMPRESSION: Chronic interstitial disease changes greater on LEFT than RIGHT. No acute abnormalities. Electronically Signed   By: Lavonia Dana M.D.   On: 04/01/2019 15:48   Vas [place] Lower  Extremity Venous (dvt)  Result Date: 04/03/2019  Lower Venous Study Indications: Edema.  Comparison Study: no prior Performing Technologist: Abram Sander RVS  Examination Guidelines: A complete evaluation includes B-mode imaging, spectral Doppler, color Doppler, and power Doppler as needed of all accessible portions of each vessel. Bilateral testing is considered an integral part of a complete examination. Limited examinations for reoccurring indications may be performed as noted.  +---------+---------------+---------+-----------+----------+-------+ RIGHT    CompressibilityPhasicitySpontaneityPropertiesSummary +---------+---------------+---------+-----------+----------+-------+ CFV      Full           Yes      Yes                          +---------+---------------+---------+-----------+----------+-------+ SFJ      Full                                                 +---------+---------------+---------+-----------+----------+-------+ FV Prox  Full                                                 +---------+---------------+---------+-----------+----------+-------+ FV Mid   Full                                                 +---------+---------------+---------+-----------+----------+-------+ FV DistalFull                                                 +---------+---------------+---------+-----------+----------+-------+  PFV      Full                                                 +---------+---------------+---------+-----------+----------+-------+ POP      Full           Yes      Yes                          +---------+---------------+---------+-----------+----------+-------+ PTV      Full                                                 +---------+---------------+---------+-----------+----------+-------+ PERO     Full                                                 +---------+---------------+---------+-----------+----------+-------+    +---------+---------------+---------+-----------+----------+-------+ LEFT     CompressibilityPhasicitySpontaneityPropertiesSummary +---------+---------------+---------+-----------+----------+-------+ CFV      Full           Yes      Yes                          +---------+---------------+---------+-----------+----------+-------+ SFJ      Full                                                 +---------+---------------+---------+-----------+----------+-------+ FV Prox  Full                                                 +---------+---------------+---------+-----------+----------+-------+ FV Mid   Full                                                 +---------+---------------+---------+-----------+----------+-------+ FV DistalFull                                                 +---------+---------------+---------+-----------+----------+-------+ PFV      Full                                                 +---------+---------------+---------+-----------+----------+-------+ POP      Full           Yes      Yes                          +---------+---------------+---------+-----------+----------+-------+ PTV  Full                                                 +---------+---------------+---------+-----------+----------+-------+ PERO     Full                                                 +---------+---------------+---------+-----------+----------+-------+     Summary: Right: There is no evidence of deep vein thrombosis in the lower extremity. No cystic structure found in the popliteal fossa. Left: There is no evidence of deep vein thrombosis in the lower extremity. No cystic structure found in the popliteal fossa.  *See table(s) above for measurements and observations. Electronically signed by Harold Barban MD on 04/03/2019 at 12:31:38 PM.    Final    US Abdomen Limited Ruq  Result Date: 04/01/2019 CLINICAL DATA:  Elevated LFTs EXAM: ULTRASOUND  ABDOMEN LIMITED RIGHT UPPER QUADRANT COMPARISON:  None. FINDINGS: Gallbladder: No gallstones or wall thickening visualized. No sonographic Murphy sign noted by sonographer. Common bile duct: Diameter: 5.3 mm Liver: Liver is echogenic. No focal hepatic abnormality. Portal vein is patent on color Doppler imaging with normal direction of blood flow towards the liver. Other: None. IMPRESSION: 1. Negative for gallstones or biliary dilatation 2. Echogenic liver consistent with steatosis and or hepatocellular disease. Electronically Signed   By: Donavan Foil M.D.   On: 04/01/2019 16:57     Labs:   Basic Metabolic Panel: Recent Labs  Lab 04/02/19 0308 04/03/19 0544 04/04/19 0617 04/05/19 0430 04/06/19 0502  NA 132* 131* 132* 132* 133*  K 4.2 4.6 5.1 4.8 4.3  CL 98 99 101 101 100  CO2 23 21* 22 23 26   GLUCOSE 129* 272* 269* 307* 193*  BUN 22 25* 33* 34* 34*  CREATININE 1.26* 1.04 1.26* 1.16 1.13  CALCIUM 7.8* 8.4* 8.2* 8.0* 8.1*  MG  --   --   --  2.0  --    GFR Estimated Creatinine Clearance: 54 mL/min (by C-G formula based on SCr of 1.13 mg/dL). Liver Function Tests: Recent Labs  Lab 04/01/19 1236 04/02/19 0308 04/04/19 0617 04/05/19 0430 04/06/19 0502  AST 75* 42* 142* 163* 114*  ALT 67* 47* 111* 190* 205*  ALKPHOS 198* 167* 160* 150* 147*  BILITOT 0.7 0.9 0.4 0.6 0.4  PROT 6.3* 5.3* 5.8* 5.5* 5.3*  ALBUMIN 2.3* 1.8* 2.1* 2.0* 2.1*   No results for input(s): LIPASE, AMYLASE in the last 168 hours. No results for input(s): AMMONIA in the last 168 hours. Coagulation profile Recent Labs  Lab 04/06/19 0502  INR 1.3*    CBC: Recent Labs  Lab 04/01/19 1236  04/02/19 0308 04/03/19 0544 04/04/19 0617 04/05/19 0430 04/06/19 0502  WBC 17.6*   < > 17.9* 16.2* 19.8* 15.5* 16.6*  NEUTROABS 13.2*  --  14.3* 14.4*  --  13.1* 11.9*  HGB 11.6*   < > 10.4* 11.2* 10.3* 10.3* 10.8*  HCT 35.3*   < > 31.4* 34.2* 31.7* 31.0* 32.1*  MCV 88.9   < > 87.5 86.4 86.8 87.6 87.2  PLT 554*    < > 440* 436* 472* 489* 539*   < > = values in this interval not displayed.   Cardiac Enzymes: Recent Labs  Lab 04/02/19 1617  CKTOTAL 31*   BNP: Invalid input(s): POCBNP CBG: Recent Labs  Lab 04/05/19 1621 04/05/19 2114 04/06/19 0704 04/06/19 1119 04/06/19 1615  GLUCAP 354* 182* 129* 226* 225*   D-Dimer No results for input(s): DDIMER in the last 72 hours. Hgb A1c No results for input(s): HGBA1C in the last 72 hours. Lipid Profile No results for input(s): CHOL, HDL, LDLCALC, TRIG, CHOLHDL, LDLDIRECT in the last 72 hours. Thyroid function studies No results for input(s): TSH, T4TOTAL, T3FREE, THYROIDAB in the last 72 hours.  Invalid input(s): FREET3 Anemia work up No results for input(s): VITAMINB12, FOLATE, FERRITIN, TIBC, IRON, RETICCTPCT in the last 72 hours. Microbiology Recent Results (from the past 240 hour(s))  Culture, blood (Routine X 2) w Reflex to ID Panel     Status: None   Collection Time: 04/01/19  2:17 PM   Specimen: BLOOD  Result Value Ref Range Status   Specimen Description BLOOD RIGHT ANTECUBITAL  Final   Special Requests   Final    BOTTLES DRAWN AEROBIC AND ANAEROBIC Blood Culture adequate volume   Culture   Final    NO GROWTH 5 DAYS Performed at [facility], [address] [place], [place] [address]    Report Status 04/06/2019 FINAL  Final  SARS Coronavirus 2 [facility] order, Performed in [facility] hospital lab) Nasopharyngeal Nasopharyngeal Swab     Status: None   Collection Time: 04/01/19  2:30 PM   Specimen: Nasopharyngeal Swab  Result Value Ref Range Status   SARS Coronavirus 2 NEGATIVE NEGATIVE Final    Comment: (NOTE) If result is NEGATIVE SARS-CoV-2 target nucleic acids are NOT DETECTED. The SARS-CoV-2 RNA is generally detectable in upper and lower  respiratory specimens during the acute phase of infection. The lowest  concentration of SARS-CoV-2 viral copies this assay can detect is 250  copies / mL. A negative result  does not preclude SARS-CoV-2 infection  and should not be used as the sole basis for treatment or other  patient management decisions.  A negative result may occur with  improper specimen collection / handling, submission of specimen other  than nasopharyngeal swab, presence of viral mutation(s) within the  areas targeted by this assay, and inadequate number of viral copies  (<250 copies / mL). A negative result must be combined with clinical  observations, patient history, and epidemiological information. If result is POSITIVE SARS-CoV-2 target nucleic acids are DETECTED. The SARS-CoV-2 RNA is generally detectable in upper and lower  respiratory specimens dur ing the acute phase of infection.  Positive  results are indicative of active infection with SARS-CoV-2.  Clinical  correlation with patient history and other diagnostic information is  necessary to determine patient infection status.  Positive results do  not rule out bacterial infection or co-infection with other viruses. If result is PRESUMPTIVE POSTIVE SARS-CoV-2 nucleic acids MAY BE PRESENT.   A presumptive positive result was obtained on the submitted specimen  and confirmed on repeat testing.  While 2019 novel coronavirus  (SARS-CoV-2) nucleic acids may be present in the submitted sample  additional confirmatory testing may be necessary for epidemiological  and / or clinical management purposes  to differentiate between  SARS-CoV-2 and other Sarbecovirus currently known to infect humans.  If clinically indicated additional testing with an alternate test  methodology [phone]) is advised. The SARS-CoV-2 RNA is generally  detectable in upper and lower respiratory sp ecimens during the acute  phase of infection. The expected result is Negative. Fact Sheet  for Patients:  StrictlyIdeas.no Fact Sheet for Healthcare Providers: BankingDealers.co.za This test is not yet approved or  cleared by the [place] FDA and has been authorized for detection and/or diagnosis of SARS-CoV-2 by FDA under an Emergency Use Authorization (EUA).  This EUA will remain in effect (meaning this test can be used) for the duration of the COVID-19 declaration under Section 564(b)(1) of the Act, 21 U.S.C. section 360bbb-3(b)(1), unless the authorization is terminated or revoked sooner. Performed at [facility], [place] [address], [place], [place] [address]   Culture, blood (Routine X 2) w Reflex to ID Panel     Status: None   Collection Time: 04/01/19  2:47 PM   Specimen: BLOOD RIGHT HAND  Result Value Ref Range Status   Specimen Description BLOOD RIGHT HAND  Final   Special Requests   Final    BOTTLES DRAWN AEROBIC AND ANAEROBIC Blood Culture results may not be optimal due to an inadequate volume of blood received in culture bottles   Culture   Final    NO GROWTH 5 DAYS Performed at [facility], [place] [address], [place], [place] [address]    Report Status 04/06/2019 FINAL  Final  Respiratory Panel by PCR     Status: None   Collection Time: 04/02/19  4:15 PM   Specimen: Nasopharyngeal Swab; Respiratory  Result Value Ref Range Status   Adenovirus NOT DETECTED NOT DETECTED Final   Coronavirus 229E NOT DETECTED NOT DETECTED Final    Comment: (NOTE) The Coronavirus on the Respiratory Panel, DOES NOT test for the novel  Coronavirus (2019 nCoV)    Coronavirus HKU1 NOT DETECTED NOT DETECTED Final   Coronavirus NL63 NOT DETECTED NOT DETECTED Final   Coronavirus OC43 NOT DETECTED NOT DETECTED Final   Metapneumovirus NOT DETECTED NOT DETECTED Final   Rhinovirus / Enterovirus NOT DETECTED NOT DETECTED Final   Influenza A NOT DETECTED NOT DETECTED Final   Influenza B NOT DETECTED NOT DETECTED Final   Parainfluenza Virus 1 NOT DETECTED NOT DETECTED Final   Parainfluenza Virus 2 NOT DETECTED NOT DETECTED Final   Parainfluenza Virus 3 NOT DETECTED NOT DETECTED Final    Parainfluenza Virus 4 NOT DETECTED NOT DETECTED Final   Respiratory Syncytial Virus NOT DETECTED NOT DETECTED Final   Bordetella pertussis NOT DETECTED NOT DETECTED Final   Chlamydophila pneumoniae NOT DETECTED NOT DETECTED Final   Mycoplasma pneumoniae NOT DETECTED NOT DETECTED Final    Comment: Performed at [facility], [place]. 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  I have increased your metformin and have also added another agent to take by mouth-- if your blood sugars are <120 then stop the glipizide and call your PCP.   You will need GI follow up with labs in 2 weeks for your liver enzymes You have also been referred to pulmonology as well as rheumatology   Increase activity slowly   Complete by: As directed  Allergies as of 04/06/2019      Reactions   Ace Inhibitors Other (See Comments)   Slow heart rate   Hydrocodone Nausea And Vomiting   Hydrocodone-acetaminophen Nausea And Vomiting   Sulfamethoxazole-trimethoprim    Got sicker      Medication List    STOP taking these medications   atorvastatin 40 MG tablet Commonly known as: LIPITOR   azithromycin 250 MG tablet Commonly known as: ZITHROMAX   levofloxacin 750 MG tablet Commonly known as: LEVAQUIN     TAKE these medications   aspirin 81 MG EC tablet Take 324 mg by mouth daily.   Co Q 10 10 MG Caps Take 1 capsule by mouth daily.   Fish Oil 1000 MG Caps Take 1,000 mg by mouth 2 (two) times daily.   furosemide 40 MG tablet Commonly known  as: LASIX Take 40 mg by mouth daily.   glipiZIDE 5 MG tablet Commonly known as: GLUCOTROL Take 1 tablet (5 mg total) by mouth 2 (two) times daily.   losartan 50 MG tablet Commonly known as: COZAAR Take 1 tablet by mouth 2 (two) times daily.   metFORMIN 500 MG tablet Commonly known as: GLUCOPHAGE Take 1 tablet (500 mg total) by mouth 2 (two) times daily with a meal. What changed: when to take this   nitroGLYCERIN 0.4 MG SL tablet Commonly known as: NITROSTAT Place 0.4 mg under the tongue every 5 (five) minutes as needed.   omeprazole 20 MG capsule Commonly known as: PRILOSEC Take 20 mg by mouth daily.   predniSONE 10 MG tablet Commonly known as: DELTASONE 30 mg PO x 5 days then 20 mg PO until after rheumatology and/or pulmonology follow up   PreserVision AREDS 2+Multi Vit Caps Take 1 capsule by mouth 2 (two) times daily.   traZODone 50 MG tablet Commonly known as: DESYREL Take 2 tablets by mouth at bedtime.   vitamin B-12 1000 MCG tablet Commonly known as: CYANOCOBALAMIN Take 1 tablet (1,000 mcg total) by mouth daily.      Follow-up Information    [facility] Follow up on 04/12/2019.   Specialty: Pulmonology Why: 11:45 AM with Wyn Quaker NP Contact information: [address] [place] [address] [phone]       [name], MD On 04/13/2019.   Specialties: Family Medicine, Interventional Cardiology, Radiology, Anesthesiology Why: will bring in blood sugar log-- may need further adjustment of medicaitons for blood sugars repeat liver enzymes in 2 weeks Contact information: [place] [address] [phone]        Jackquline Denmark, MD Follow up in 4 week(s).   Specialties: Gastroenterology, Internal Medicine Contact information: [address] [place] [address] [phone]        [name], MD On 04/11/2019.   Specialties: Family Medicine, Interventional  Cardiology, Radiology, Anesthesiology Why: Please follow up with appointment on 04/11/19 Contact information: [address] [place] [address] [phone]            Time coordinating discharge: 35 min  Signed:  Geradine Girt DO  [facility] 04/07/2019, 4:27 PM

## 2019-04-06 NOTE — Care Management Important Message (Signed)
Important Message  Patient Details  Name: Kerry Matthews MRN: [id number] Date of Birth: 1936/05/07   Medicare Important Message Given:  Yes     Shelda Altes 04/06/2019, 2:31 PM

## 2019-04-07 ENCOUNTER — Telehealth: Payer: Self-pay | Admitting: Pulmonary Disease

## 2019-04-07 ENCOUNTER — Encounter: Payer: Self-pay | Admitting: Gastroenterology

## 2019-04-07 DIAGNOSIS — R768 Other specified abnormal immunological findings in serum: Secondary | ICD-10-CM

## 2019-04-07 DIAGNOSIS — J849 Interstitial pulmonary disease, unspecified: Secondary | ICD-10-CM

## 2019-04-07 DIAGNOSIS — M3320 Polymyositis, organ involvement unspecified: Secondary | ICD-10-CM

## 2019-04-07 NOTE — Telephone Encounter (Signed)
04/07/2019 1614  Can try to place an order to establish at [facility] rheumatology either Dr. Amil Amen or Dr. Trudie Reed or to [facility].   These are private rheumatology offices that we also work with.  I am okay with you place an order for patient to be established.  I would also provide contact information for the office for the patient, I would recommend they try contacting their offices directly to see if they can be seen sooner.  They may be able to as they have more providers.  Indication for referral: Concern for poly-myositis, elevated Rh factor, interstitial lung disease  Wyn Quaker, FNP

## 2019-04-07 NOTE — Telephone Encounter (Signed)
Referral placed as  "urgent" with notation to contact the patient with the office name once referral has been processed. Called the patient's daughter Kerry Matthews to advise she will be contacted either by the [facility] or the office able to process the request submitted and once she gets that contact information to make sure to ask that office if they also have a cancellation list if they cannot get him in right away. She voiced understanding, nothing further needed at this time.

## 2019-04-07 NOTE — Telephone Encounter (Signed)
Note:  This patient has not been seen in our office - hosp follow up scheduled 04/12/19 Returned call to patient's daughter, Laqueta Due.  She states she is trying to assist her father with connecting to appointments after recent hospitalization.  Has appt scheduled at [facility] with Tonia Brooms, NP on 04/12/19 re: hosp f/up And Dr. Irine Seal has referred patient to rheumatology but referral was decline as noted: [place] ----- Message ----- From: Carole Binning, LPN Sent: 11/25/4740   2:04 PM EDT To: Eugenie Filler, MD We appreciate your referral. Each referral is reviewed by our providers to ensure that the patient will receive the best medical care possible. Upon review of this referral Dr. Estanislado Pandy has declined it. We have no urgent appointments available.   Daughter states Dr. Arlean Hopping office states if they need sooner appt they may need to go to [facility] or somewhere else.  Since referral was made at the hospital, the patient does not know who to call about the urgency for this referral.  This is the reason for their call here today.  The patient's PCP sent him to the ED due to 'she could not determine what was wrong with the patient' according to the daughter.  They has not contacted the PCP regarding this referral.    The daughter states she is just trying to determine what they should do regarding this referral. Advised we will route this concern to our provider for review/recommendations.  Daughter states patient's 'liver enzymes are still elevated' but he is doing a 'little better.'      Routed to B. Warner Mccreedy, NP for review and advice.  The daughter states a call back tomorrow is fine if we don't get this reviewed today.

## 2019-04-08 ENCOUNTER — Telehealth: Payer: Self-pay | Admitting: Pulmonary Disease

## 2019-04-08 NOTE — Telephone Encounter (Signed)
Called and spoke with Anderson Malta from [facility] in regards to the referral that was placed. I stated to Anderson Malta that pt has never been seen at our office that he was recently in the hospital and that was how the referral came about. Anderson Malta stated that the referral was going to be declined as they do not take referrals based off of a hospital stay that pt has to be seen by a physician in office before they will accept pt to be able to be seen for referral.  Routing to [place] as an [place].

## 2019-04-08 NOTE — Telephone Encounter (Signed)
Left message with Anderson Malta stating that I faxed over labs from hospital and demographics info. I also see a phone note stating that he has to been seen in clinic before they will see him. He has an appt 08/18, we will send referral then. Informed her if anything else was needed to give [place] a call back. Nothing further is needed at this time.

## 2019-04-08 NOTE — Telephone Encounter (Signed)
Well okay.  Even though patient has lab work from his hospitalization?  That is interesting.Please notify the patient as well as patient's daughter.  We can refer again on 04/12/2019 when we see the patient.Wyn Quaker, FNP

## 2019-04-11 ENCOUNTER — Telehealth: Payer: Self-pay | Admitting: Pulmonary Disease

## 2019-04-11 DIAGNOSIS — M3321 Polymyositis with respiratory involvement: Secondary | ICD-10-CM | POA: Diagnosis not present

## 2019-04-11 DIAGNOSIS — R740 Nonspecific elevation of levels of transaminase and lactic acid dehydrogenase [LDH]: Secondary | ICD-10-CM | POA: Diagnosis not present

## 2019-04-11 DIAGNOSIS — E1165 Type 2 diabetes mellitus with hyperglycemia: Secondary | ICD-10-CM | POA: Diagnosis not present

## 2019-04-11 NOTE — Progress Notes (Signed)
@Patient  ID: Kerry Matthews, male    DOB: 07-27-1936, 83 y.o.   MRN: [id number]  Chief Complaint  Patient presents with   Hospitalization Follow-up    abnormal CT - early UIP?, elevated labs neds Rheum referral     Referring provider: [name], MD  HPI:  83 year old male never smoker initially consulted with our practice on 04/02/2019 while inpatient for an abnormal CT chest.  CT chest favoring early UIP.  Patient also with elevated connective tissue work-up for ANCA as well as Rh.  Has not been managed by rheumatology previously.  Concern for poly-myositis.  PMH: DM, HTN, ?Alpha-1-antitrypsin deficiency Smoker/ Smoking History: Never Smoker  Maintenance:  None Pt of: Will establish with Dr. Vaughan Browner  04/12/2019  - Visit   83 year old male never smoker initially consulted with our practice on 04/02/2019 while inpatient due to an abnormal CT chest in the setting of joint pain.  Abnormal CT chest looks like early UIP.  Patient presenting to our office today to establish with [place] for outpatient follow-up.  CT chest also suggest pulmonary hypertension and patient with fluid overload in the hospital.  Echocardiogram completed on 04/04/2019 shows LV ejection fraction 60 to 65%, mild LVH, right ventricle with normal systolic function.  Patient is currently maintained on Lasix 40 mg.  He reports his weights are stable.  He is hoping that his primary care doctor will decrease this.  Patient was discharged home on chronic steroids to be maintained on till rheumatology follow-up.  Patient was referred to rheumatology but unfortunately this was declined due to those not an outpatient office visit.  Patient will need rheumatology referral today.  Patient with elevated p-ANCA and Rh factor.  See lab work listed below.  Patient reports that since being discharged from the hospital he feels much better.  He reports that his joint pain has gone away.  His only complaint today is that he is elevated blood sugars  likely related to his prednisone.  He is hoping to decrease his daily prednisone use.  Patient spouse reporting today that patient sister died of pulmonary fibrosis.  They are unsure if this was idiopathic pulmonary fibrosis.  Patient spouse reports the patient's family has lots of lung problems.  Patient reports that his primary care provider diagnosed him with alpha-1 and that they have been following his levels yearly because of this.  We will obtain an alpha-1 blood test today as well as a phenotype.  Patient also worked primarily as a [employer].  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  Patient does spray a lot of chemicals and does not wear a mask.  Patient has never had formal breathing test before.  Patient does not believe in taking vaccines.   Tests:   04/01/2019- hospitalization- diffuse muscle weakness >>>Consulted with PCCM on 04/02/2019  04/02/2019-respiratory virus panel-none detected  04/01/2019-SARS-CoV-2-negative  04/01/2019-CT chest with contrast- no acute process in chest abdomen or pelvis, interstitial lung disease suspicious for early or mild UIP, pulmonary artery enlargement suggest PAH  04/04/2019-echocardiogram- LV ejection fraction 60 to 65%, mildly increased left left ventricular wall thickness, right ventricle is normal systolic function, cavity size was normal  04/02/2019-connective tissue work-up: Anti-Jo 1-negative Anti-DNA antibody double-stranded-negative Anti-scleroderma antibody-negative Sjogren's syndrome antibody-negative Sjogren's syndrome antibody-negative CK-31 CCP-6 EBV-greater than 600 MPO/PR-3  ANCA antibodies- myeloperoxidase ABS-greater than 100, ANCA proteinase 3-less than 3.5 ANCA titers- p-ANCA +1: 160, C ANCA-less than 1: 20, atypical p-ANCA titer-less than 1: 20  04/01/2019-sed rate- 42  04/03/2019-rheumatoid factor-95.4  SIX MIN WALK 04/12/2019  Tech Comments: Patient walked 2 laps at a slow pace. Denies SOB, chest pain  or chest discomfort. Tolerated well.     No results found for: NITRICOXIDE  PFT: No flowsheet data found.  Imaging: Ct Chest W Contrast  Result Date: 04/01/2019 CLINICAL DATA:  Cough and fevers. Negative COVID-19 tests x2. Elevated liver function tests. Acute respiratory illness. EXAM: CT CHEST, ABDOMEN, AND PELVIS WITH CONTRAST TECHNIQUE: Multidetector CT imaging of the chest, abdomen and pelvis was performed following the standard protocol during bolus administration of intravenous contrast. CONTRAST:  170mL OMNIPAQUE IOHEXOL 300 MG/ML  SOLN COMPARISON:  Chest radiograph earlier today.  No comparison CTs. FINDINGS: CT CHEST FINDINGS Cardiovascular: Aortic atherosclerosis. Tortuous thoracic aorta. Borderline cardiomegaly, without pericardial effusion. Multivessel coronary artery atherosclerosis. Pulmonary artery enlargement, outflow tract 3.3 cm. No central pulmonary embolism, on this non-dedicated study. Mediastinum/Nodes: borderline enlarged right paratracheal node of 1.0 cm on image 27/3. A subcarinal node measures 1.2 cm on 33/3. No hilar adenopathy. Lungs/Pleura: No pleural fluid. Diffuse but slightly basilar predominant subpleural reticulation with traction bronchiolectasis. This is slightly worse on the left. No areas of consolidation or ground-glass to suggest pneumonia. Musculoskeletal: No acute osseous abnormality. CT ABDOMEN PELVIS FINDINGS Hepatobiliary: Suspicion of mild hepatic steatosis. Normal gallbladder, without biliary ductal dilatation. Pancreas: Fatty atrophy throughout the pancreas. Spleen: Normal in size, without focal abnormality. Adrenals/Urinary Tract: Normal adrenal glands. Mild renal cortical thinning bilaterally. Bilateral renal sinus cysts, but no hydronephrosis. Mild bladder distension with minimal right-sided bladder at the origin of the right inguinal hernia. Example image 110/3 and coronal image 55. Stomach/Bowel: Normal stomach, without wall thickening. Scattered  colonic diverticula. Normal terminal ileum. Normal small bowel. Vascular/Lymphatic: Aortic and branch vessel atherosclerosis. No abdominopelvic adenopathy. Reproductive: Normal prostate. Other: No significant free fluid. Right inguinal hernia contains fat and minimal urinary bladder. Musculoskeletal: Mild osteopenia. Convex right lumbar spine curvature. Lumbar spondylosis. IMPRESSION: 1. No acute process in the chest, abdomen, or pelvis. No acute explanation for patient's symptoms. 2. Interstitial lung disease, suspicious for early/mild usual interstitial pneumonitis (typically due to pulmonary fibrosis). Differential considerations include nonspecific interstitial pneumonitis. Depending on ongoing chronic clinical symptomatology, consider high-resolution chest CT at 6-12 months. 3. Suspicion of mild hepatic steatosis. 4. Coronary artery atherosclerosis. Aortic Atherosclerosis (ICD10-I70.0). 5. Pulmonary artery enlargement suggests pulmonary arterial hypertension. 6. Right inguinal hernia containing fat and minimal urinary bladder at its origin. 7. Borderline thoracic adenopathy, favored to be reactive. Electronically Signed   By: Abigail Miyamoto M.D.   On: 04/01/2019 19:06   Ct Abdomen Pelvis W Contrast  Result Date: 04/01/2019 CLINICAL DATA:  Cough and fevers. Negative COVID-19 tests x2. Elevated liver function tests. Acute respiratory illness. EXAM: CT CHEST, ABDOMEN, AND PELVIS WITH CONTRAST TECHNIQUE: Multidetector CT imaging of the chest, abdomen and pelvis was performed following the standard protocol during bolus administration of intravenous contrast. CONTRAST:  168mL OMNIPAQUE IOHEXOL 300 MG/ML  SOLN COMPARISON:  Chest radiograph earlier today.  No comparison CTs. FINDINGS: CT CHEST FINDINGS Cardiovascular: Aortic atherosclerosis. Tortuous thoracic aorta. Borderline cardiomegaly, without pericardial effusion. Multivessel coronary artery atherosclerosis. Pulmonary artery enlargement, outflow tract 3.3 cm.  No central pulmonary embolism, on this non-dedicated study. Mediastinum/Nodes: borderline enlarged right paratracheal node of 1.0 cm on image 27/3. A subcarinal node measures 1.2 cm on 33/3. No hilar adenopathy. Lungs/Pleura: No pleural fluid. Diffuse but slightly basilar predominant subpleural reticulation with traction bronchiolectasis. This is slightly worse on the left. No areas of consolidation or ground-glass  to suggest pneumonia. Musculoskeletal: No acute osseous abnormality. CT ABDOMEN PELVIS FINDINGS Hepatobiliary: Suspicion of mild hepatic steatosis. Normal gallbladder, without biliary ductal dilatation. Pancreas: Fatty atrophy throughout the pancreas. Spleen: Normal in size, without focal abnormality. Adrenals/Urinary Tract: Normal adrenal glands. Mild renal cortical thinning bilaterally. Bilateral renal sinus cysts, but no hydronephrosis. Mild bladder distension with minimal right-sided bladder at the origin of the right inguinal hernia. Example image 110/3 and coronal image 55. Stomach/Bowel: Normal stomach, without wall thickening. Scattered colonic diverticula. Normal terminal ileum. Normal small bowel. Vascular/Lymphatic: Aortic and branch vessel atherosclerosis. No abdominopelvic adenopathy. Reproductive: Normal prostate. Other: No significant free fluid. Right inguinal hernia contains fat and minimal urinary bladder. Musculoskeletal: Mild osteopenia. Convex right lumbar spine curvature. Lumbar spondylosis. IMPRESSION: 1. No acute process in the chest, abdomen, or pelvis. No acute explanation for patient's symptoms. 2. Interstitial lung disease, suspicious for early/mild usual interstitial pneumonitis (typically due to pulmonary fibrosis). Differential considerations include nonspecific interstitial pneumonitis. Depending on ongoing chronic clinical symptomatology, consider high-resolution chest CT at 6-12 months. 3. Suspicion of mild hepatic steatosis. 4. Coronary artery atherosclerosis. Aortic  Atherosclerosis (ICD10-I70.0). 5. Pulmonary artery enlargement suggests pulmonary arterial hypertension. 6. Right inguinal hernia containing fat and minimal urinary bladder at its origin. 7. Borderline thoracic adenopathy, favored to be reactive. Electronically Signed   By: Abigail Miyamoto M.D.   On: 04/01/2019 19:06   Dg Chest Port 1 View  Result Date: 04/01/2019 CLINICAL DATA:  Cough, shortness of breath and weakness for 3-4 months, COVID-19 like symptoms but 2 negative COVID-19 tests EXAM: PORTABLE CHEST 1 VIEW COMPARISON:  Portable exam 1527 hours compared to 03/25/2019 FINDINGS: Normal heart size, mediastinal contours, and pulmonary vascularity. Chronic interstitial lung disease at the periphery of the mid to lower lungs bilaterally greater on LEFT. No definite acute infiltrate, pleural effusion or pneumothorax. Bones demineralized. IMPRESSION: Chronic interstitial disease changes greater on LEFT than RIGHT. No acute abnormalities. Electronically Signed   By: Lavonia Dana M.D.   On: 04/01/2019 15:48   Vas [place] Lower Extremity Venous (dvt)  Result Date: 04/03/2019  Lower Venous Study Indications: Edema.  Comparison Study: no prior Performing Technologist: Abram Sander RVS  Examination Guidelines: A complete evaluation includes B-mode imaging, spectral Doppler, color Doppler, and power Doppler as needed of all accessible portions of each vessel. Bilateral testing is considered an integral part of a complete examination. Limited examinations for reoccurring indications may be performed as noted.  +---------+---------------+---------+-----------+----------+-------+  RIGHT     Compressibility Phasicity Spontaneity Properties Summary  +---------+---------------+---------+-----------+----------+-------+  CFV       Full            Yes       Yes                             +---------+---------------+---------+-----------+----------+-------+  SFJ       Full                                                       +---------+---------------+---------+-----------+----------+-------+  FV Prox   Full                                                      +---------+---------------+---------+-----------+----------+-------+  FV Mid    Full                                                      +---------+---------------+---------+-----------+----------+-------+  FV Distal Full                                                      +---------+---------------+---------+-----------+----------+-------+  PFV       Full                                                      +---------+---------------+---------+-----------+----------+-------+  POP       Full            Yes       Yes                             +---------+---------------+---------+-----------+----------+-------+  PTV       Full                                                      +---------+---------------+---------+-----------+----------+-------+  PERO      Full                                                      +---------+---------------+---------+-----------+----------+-------+   +---------+---------------+---------+-----------+----------+-------+  LEFT      Compressibility Phasicity Spontaneity Properties Summary  +---------+---------------+---------+-----------+----------+-------+  CFV       Full            Yes       Yes                             +---------+---------------+---------+-----------+----------+-------+  SFJ       Full                                                      +---------+---------------+---------+-----------+----------+-------+  FV Prox   Full                                                      +---------+---------------+---------+-----------+----------+-------+  FV Mid    Full                                                      +---------+---------------+---------+-----------+----------+-------+  FV Distal Full                                                      +---------+---------------+---------+-----------+----------+-------+  PFV       Full                                                       +---------+---------------+---------+-----------+----------+-------+  POP       Full            Yes       Yes                             +---------+---------------+---------+-----------+----------+-------+  PTV       Full                                                      +---------+---------------+---------+-----------+----------+-------+  PERO      Full                                                      +---------+---------------+---------+-----------+----------+-------+     Summary: Right: There is no evidence of deep vein thrombosis in the lower extremity. No cystic structure found in the popliteal fossa. Left: There is no evidence of deep vein thrombosis in the lower extremity. No cystic structure found in the popliteal fossa.  *See table(s) above for measurements and observations. Electronically signed by Harold Barban MD on 04/03/2019 at 12:31:38 PM.    Final    US Abdomen Limited Ruq  Result Date: 04/01/2019 CLINICAL DATA:  Elevated LFTs EXAM: ULTRASOUND ABDOMEN LIMITED RIGHT UPPER QUADRANT COMPARISON:  None. FINDINGS: Gallbladder: No gallstones or wall thickening visualized. No sonographic Murphy sign noted by sonographer. Common bile duct: Diameter: 5.3 mm Liver: Liver is echogenic. No focal hepatic abnormality. Portal vein is patent on color Doppler imaging with normal direction of blood flow towards the liver. Other: None. IMPRESSION: 1. Negative for gallstones or biliary dilatation 2. Echogenic liver consistent with steatosis and or hepatocellular disease. Electronically Signed   By: Donavan Foil M.D.   On: 04/01/2019 16:57      Specialty Problems      Pulmonary Problems   Pneumonia   Community acquired pneumonia   Cough   Pneumonitis   Interstitial pulmonary disease ([place])    04/01/2019-CT chest with contrast- no acute process in chest abdomen or pelvis, interstitial lung disease suspicious for early or mild UIP, pulmonary artery  enlargement suggest PAH  04/02/2019-connective tissue work-up: Anti-Jo 1-negative Anti-DNA antibody double-stranded-negative Anti-scleroderma antibody-negative Sjogren's syndrome antibody-negative Sjogren's syndrome antibody-negative CK-31 CCP-6 EBV-greater than 600 MPO/PR-3  ANCA antibodies- myeloperoxidase ABS-greater than 100, ANCA proteinase 3-less than 3.5 ANCA titers- p-ANCA +1: 160, C ANCA-less than 1: 20, atypical p-ANCA titer-less than 1: 20  04/01/2019-sed rate- 42 04/03/2019-rheumatoid factor-95.4  Allergies  Allergen Reactions   Ace Inhibitors Other (See Comments)    Slow heart rate   Hydrocodone Nausea And Vomiting   Hydrocodone-Acetaminophen Nausea And Vomiting   Sulfamethoxazole-Trimethoprim     Got sicker     There is no immunization history on file for this patient.  Never takes vaccines  >>>refused today   Past Medical History:  Diagnosis Date   Coronary artery disease    Diabetes ([place])    Hyperlipidemia    Hypertension    Osteoarthritis    Renal stones    Skin cancer     Tobacco History: Social History   Tobacco Use  Smoking Status Never Smoker  Smokeless Tobacco Never Used   Counseling given: Yes   Continue to not smoke  Outpatient Encounter Medications as of 04/12/2019  Medication Sig   aspirin 81 MG EC tablet Take 324 mg by mouth daily.    Coenzyme Q10 (CO Q 10) 10 MG CAPS Take 1 capsule by mouth daily.   furosemide (LASIX) 40 MG tablet Take 40 mg by mouth daily.    glipiZIDE (GLUCOTROL) 5 MG tablet Take 1 tablet (5 mg total) by mouth 2 (two) times daily.   losartan (COZAAR) 50 MG tablet Take 1 tablet by mouth 2 (two) times daily.   metFORMIN (GLUCOPHAGE) 500 MG tablet Take 1 tablet (500 mg total) by mouth 2 (two) times daily with a meal.   Multiple Vitamins-Minerals (PRESERVISION AREDS 2+MULTI VIT) CAPS Take 1 capsule by mouth 2 (two) times daily.   nitroGLYCERIN (NITROSTAT) 0.4 MG SL tablet Place 0.4 mg  under the tongue every 5 (five) minutes as needed.   Omega-3 Fatty Acids (FISH OIL) 1000 MG CAPS Take 1,000 mg by mouth 2 (two) times daily.    omeprazole (PRILOSEC) 20 MG capsule Take 20 mg by mouth daily.   predniSONE (DELTASONE) 10 MG tablet 30 mg PO x 5 days then 20 mg PO until after rheumatology and/or pulmonology follow up   traZODone (DESYREL) 50 MG tablet Take 2 tablets by mouth at bedtime.   vitamin B-12 (CYANOCOBALAMIN) 1000 MCG tablet Take 1 tablet (1,000 mcg total) by mouth daily.   No facility-administered encounter medications on file as of 04/12/2019.     Review of Systems  Review of Systems  Constitutional: Negative for activity change, chills, fatigue, fever and unexpected weight change.  HENT: Negative for rhinorrhea, sinus pressure, sinus pain and sore throat.   Eyes: Negative.   Respiratory: Negative for cough, shortness of breath and wheezing.   Cardiovascular: Negative for chest pain and palpitations.  Gastrointestinal: Negative for constipation, diarrhea, nausea and vomiting.  Endocrine: Negative.   Genitourinary: Negative.   Musculoskeletal: Positive for arthralgias (This is improved on chronic steroids since hospital discharge).  Skin: Negative.   Neurological: Negative for dizziness and headaches.  Psychiatric/Behavioral: Negative.  Negative for dysphoric mood. The patient is not nervous/anxious.   All other systems reviewed and are negative.    Physical Exam  BP (!) 98/52    Pulse 68    Temp 98.2 F (36.8 C) (Temporal)    Wt 176 lb (79.8 kg)    SpO2 98%    BMI 26.76 kg/m   Wt Readings from Last 5 Encounters:  04/12/19 176 lb (79.8 kg)  04/06/19 184 lb 8 oz (83.7 kg)  12/20/18 185 lb (83.9 kg)  05/11/18 185 lb (83.9 kg)    Physical Exam Vitals signs and nursing note reviewed.  Constitutional:      General:  He is not in acute distress.    Appearance: Normal appearance. He is obese.  HENT:     Head: Normocephalic and atraumatic.     Right  Ear: Hearing, tympanic membrane, ear canal and external ear normal.     Left Ear: Hearing, tympanic membrane, ear canal and external ear normal.     Nose: Nose normal.     Mouth/Throat:     Mouth: Mucous membranes are moist.     Pharynx: Oropharynx is clear. No oropharyngeal exudate.  Eyes:     Pupils: Pupils are equal, round, and reactive to light.  Neck:     Musculoskeletal: Normal range of motion.  Cardiovascular:     Rate and Rhythm: Normal rate and regular rhythm.     Pulses: Normal pulses.     Heart sounds: Normal heart sounds. No murmur.  Pulmonary:     Effort: Pulmonary effort is normal.     Breath sounds: Rales (Basilar crackles, left greater than right) present. No decreased breath sounds or wheezing.  Musculoskeletal:     Right lower leg: No edema.     Left lower leg: No edema.  Lymphadenopathy:     Cervical: No cervical adenopathy.  Skin:    General: Skin is warm and dry.     Capillary Refill: Capillary refill takes less than 2 seconds.     Findings: No erythema or rash.  Neurological:     General: No focal deficit present.     Mental Status: He is alert and oriented to person, place, and time.     Motor: No weakness.     Coordination: Coordination normal.     Gait: Gait is intact. Gait (Tolerated walk in office) normal.  Psychiatric:        Mood and Affect: Mood normal.        Behavior: Behavior normal. Behavior is cooperative.        Thought Content: Thought content normal.        Judgment: Judgment normal.     Lab Results:  CBC    Component Value Date/Time   WBC 16.6 (H) 04/06/2019 0502   RBC 3.68 (L) 04/06/2019 0502   HGB 10.8 (L) 04/06/2019 0502   HCT 32.1 (L) 04/06/2019 0502   PLT 539 (H) 04/06/2019 0502   MCV 87.2 04/06/2019 0502   MCH 29.3 04/06/2019 0502   MCHC 33.6 04/06/2019 0502   RDW 12.9 04/06/2019 0502   LYMPHSABS 2.0 04/06/2019 0502   MONOABS 1.1 (H) 04/06/2019 0502   EOSABS 0.0 04/06/2019 0502   BASOSABS 0.1 04/06/2019 0502     BMET    Component Value Date/Time   NA 133 (L) 04/06/2019 0502   K 4.3 04/06/2019 0502   CL 100 04/06/2019 0502   CO2 26 04/06/2019 0502   GLUCOSE 193 (H) 04/06/2019 0502   BUN 34 (H) 04/06/2019 0502   CREATININE 1.13 04/06/2019 0502   CALCIUM 8.1 (L) 04/06/2019 0502   GFRNONAA >60 04/06/2019 0502   GFRAA >60 04/06/2019 0502    BNP    Component Value Date/Time   BNP 70.0 04/01/2019 1417    ProBNP No results found for: PROBNP    Assessment & Plan:   Interstitial pulmonary disease ([place]) Plan: Walk today in office We will establish with Dr. Vaughan Browner for outpatient follow-up in pulmonary, in the next 6 to 8 weeks We will order pulmonary function test to be completed prior to next office visit ILD questionnaire provided today We will order high-resolution CT  chest with ILD protocol to be completed in February/2021 Recommended flu vaccine and pneumonia vaccine, patient refused Referral to rheumatology  Abnormal ANCA test Plan: Referral to rheumatology  Elevated rheumatoid factor Plan: Referral to rheumatology Continue daily prednisone 20 mg daily for the next 7 days, then transition to 10 mg daily and be maintained on this until seen rheumatology  History of alpha-1-antitrypsin deficiency Suspect patient is more of a alpha-1 carrier versus actually having alpha-1 antitrypsin deficiency  Plan: Alpha-1 blood work today  Lower extremity edema Appears stable today  Plan: We will have patient follow-up with primary care for them to give guidance regarding chronic diuretic use  Abnormal findings on diagnostic imaging of lung Plan: Pulmonary function test ordered today Referral to rheumatology We will follow-up and establish with Dr. Vaughan Browner for outpatient management We will order high-resolution CT chest to be completed in February/2021    Return in about 2 months (around 06/12/2019), or if symptoms worsen or fail to improve, for Follow up with Dr. Vaughan Browner -  29min office visit to establish, Follow up for PFT.   Lauraine Rinne, NP 04/12/2019   This appointment was 42 minutes long with over 50% of the time in direct face-to-face patient care, assessment, plan of care, and follow-up.

## 2019-04-11 NOTE — Telephone Encounter (Signed)
Brian - please advise. Thanks. 

## 2019-04-11 NOTE — Telephone Encounter (Signed)
Thanks for all you do! Kerry Matthews

## 2019-04-11 NOTE — Telephone Encounter (Signed)
Noted.  Will hold in chart and route back to [place] as FYI to re-order referral at [place].  Nothing further needed at this time- will close encounter.

## 2019-04-11 NOTE — Telephone Encounter (Signed)
I will see the patient on 04/12/2019.  We can refer the patient then.  It is unfortunate that hospital lab work cannot justify the referral as patient has elevated connective tissue work-up results for rheumatoid factor as well as ANCA.  Wyn Quaker, FNP

## 2019-04-11 NOTE — Telephone Encounter (Signed)
"  Rejection Reason - Unapproved Service - [facility] called back stating this was a hospital referral and the patient has not been seen at that office. We do not take Hospital referrals. Patient would need to be seen at his PCP and then notes sent to our office for review. Thank you. " [facility] said 3 days ago

## 2019-04-12 ENCOUNTER — Encounter: Payer: Self-pay | Admitting: Pulmonary Disease

## 2019-04-12 ENCOUNTER — Other Ambulatory Visit: Payer: Self-pay

## 2019-04-12 ENCOUNTER — Ambulatory Visit: Payer: Medicare HMO | Admitting: Pulmonary Disease

## 2019-04-12 VITALS — BP 98/52 | HR 68 | Temp 98.2°F | Wt 176.0 lb

## 2019-04-12 DIAGNOSIS — J849 Interstitial pulmonary disease, unspecified: Secondary | ICD-10-CM

## 2019-04-12 DIAGNOSIS — M3320 Polymyositis, organ involvement unspecified: Secondary | ICD-10-CM

## 2019-04-12 DIAGNOSIS — R768 Other specified abnormal immunological findings in serum: Secondary | ICD-10-CM

## 2019-04-12 DIAGNOSIS — R918 Other nonspecific abnormal finding of lung field: Secondary | ICD-10-CM

## 2019-04-12 DIAGNOSIS — R600 Localized edema: Secondary | ICD-10-CM | POA: Diagnosis not present

## 2019-04-12 DIAGNOSIS — Z8639 Personal history of other endocrine, nutritional and metabolic disease: Secondary | ICD-10-CM | POA: Insufficient documentation

## 2019-04-12 DIAGNOSIS — R7689 Other specified abnormal immunological findings in serum: Secondary | ICD-10-CM

## 2019-04-12 HISTORY — DX: Other specified abnormal immunological findings in serum: R76.8

## 2019-04-12 HISTORY — DX: Interstitial pulmonary disease, unspecified: J84.9

## 2019-04-12 HISTORY — DX: Personal history of other endocrine, nutritional and metabolic disease: Z86.39

## 2019-04-12 HISTORY — DX: Other nonspecific abnormal finding of lung field: R91.8

## 2019-04-12 NOTE — Assessment & Plan Note (Signed)
Plan: Referral to rheumatology 

## 2019-04-12 NOTE — Assessment & Plan Note (Signed)
Plan: Walk today in office We will establish with Dr. Vaughan Browner for outpatient follow-up in pulmonary, in the next 6 to 8 weeks We will order pulmonary function test to be completed prior to next office visit ILD questionnaire provided today We will order high-resolution CT chest with ILD protocol to be completed in February/2021 Recommended flu vaccine and pneumonia vaccine, patient refused Referral to rheumatology

## 2019-04-12 NOTE — Assessment & Plan Note (Signed)
Suspect patient is more of a alpha-1 carrier versus actually having alpha-1 antitrypsin deficiency  Plan: Alpha-1 blood work today

## 2019-04-12 NOTE — Assessment & Plan Note (Signed)
Plan: Referral to rheumatology Continue daily prednisone 20 mg daily for the next 7 days, then transition to 10 mg daily and be maintained on this until seen rheumatology

## 2019-04-12 NOTE — Assessment & Plan Note (Signed)
Appears stable today  Plan: We will have patient follow-up with primary care for them to give guidance regarding chronic diuretic use

## 2019-04-12 NOTE — Patient Instructions (Addendum)
You were seen today by Lauraine Rinne, NP  for:   1. History of alpha-1-antitrypsin deficiency  - Alpha-1 antitrypsin phenotype; Future - Pulmonary function test; Future - CT Chest High Resolution; Future - Alpha-1 antitrypsin phenotype  2. Polymyositis ([place])  - Pulmonary function test; Future - CT Chest High Resolution; Future - Ambulatory referral to Rheumatology  Continue Prednisone 20mg  daily for the next 7 days, then take 10mg  daily till you see Rheumatology  >>>take in am  >>>take with food   3. Elevated rheumatoid factor  - Pulmonary function test; Future - CT Chest High Resolution; Future - Ambulatory referral to Rheumatology  4. Interstitial pulmonary disease ([place])  - Pulmonary function test; Future - CT Chest High Resolution; Future - Ambulatory referral to Rheumatology  Walk today  ILD questionaire provided today - please complete Contact Sisters family about her Pulmonary Fibrosis >>> Did she have Idiopathic Pulmonary Fibrosis or IPF?  Continue Prednisone 20mg  daily for the next 7 days, then take 10mg  daily till you see Rheumatology  >>>take in am  >>>take with food   5. Abnormal ANCA test  - Ambulatory referral to Rheumatology   We recommend today:  Orders Placed This Encounter  Procedures  . CT Chest High Resolution    Complete feb/2021  -High-res CT with supine and prone positioning -inspiratory and expiratory cuts.  -Only to be read by Dr. Rosario Jacks and Dr. Weber Cooks.    Standing Status:   Future    Standing Expiration Date:   06/11/2020    Scheduling Instructions:     Complete feb/2021    Order Specific Question:   Preferred imaging location?    Answer:   [place]    Order Specific Question:   Radiology Contrast Protocol - do NOT remove file path    Answer:   [email or web address]  . Alpha-1 antitrypsin phenotype    Standing Status:   Future    Number of Occurrences:   1    Standing Expiration Date:    04/11/2020  . Ambulatory referral to Rheumatology    Referral Priority:   Routine    Referral Type:   Consultation    Referral Reason:   Specialty Services Required    Requested Specialty:   Rheumatology    Number of Visits Requested:   1  . Pulmonary function test    Standing Status:   Future    Standing Expiration Date:   04/11/2020    Order Specific Question:   Where should this test be performed?    Answer:   [facility]    Order Specific Question:   Full PFT: includes the following: basic spirometry, spirometry pre & post bronchodilator, diffusion capacity (DLCO), lung volumes    Answer:   Full PFT   Orders Placed This Encounter  Procedures  . CT Chest High Resolution  . Alpha-1 antitrypsin phenotype  . Ambulatory referral to Rheumatology  . Pulmonary function test   No orders of the defined types were placed in this encounter.   Follow Up:    Return in about 2 months (around 06/12/2019), or if symptoms worsen or fail to improve, for Follow up with Dr. Vaughan Browner - 67min office visit to establish, Follow up for PFT.   Please do your part to reduce the spread of COVID-19:      Reduce your risk of any infection  and COVID19 by using the similar precautions used for avoiding the common cold or flu:  Marland Kitchen Wash your  hands often with soap and warm water for at least 20 seconds.  If soap and water are not readily available, use an alcohol-based hand sanitizer with at least 60% alcohol.  . If coughing or sneezing, cover your mouth and nose by coughing or sneezing into the elbow areas of your shirt or coat, into a tissue or into your sleeve (not your hands). Langley Gauss A MASK when in public  . Avoid shaking hands with others and consider head nods or verbal greetings only. . Avoid touching your eyes, nose, or mouth with unwashed hands.  . Avoid close contact with people who are sick. . Avoid places or events with large numbers of people in one location, like concerts or sporting  events. . If you have some symptoms but not all symptoms, continue to monitor at home and seek medical attention if your symptoms worsen. . If you are having a medical emergency, call 911.   [place] / e-Visit: [email or web address]         [facility]: [facility]: [phone]                   [facility]: [phone]     It is flu season:   >>> Best ways to protect herself from the flu: Receive the yearly flu vaccine, practice good hand hygiene washing with soap and also using hand sanitizer when available, eat a nutritious meals, get adequate rest, hydrate appropriately   Please contact the office if your symptoms worsen or you have concerns that you are not improving.   Thank you for choosing [facility] for your healthcare, and for allowing [place] to partner with you on your healthcare journey. I am thankful to be able to provide care to you today.   Wyn Quaker FNP-C

## 2019-04-12 NOTE — Assessment & Plan Note (Signed)
Plan: Pulmonary function test ordered today Referral to rheumatology We will follow-up and establish with Dr. Vaughan Browner for outpatient management We will order high-resolution CT chest to be completed in February/2021

## 2019-04-14 ENCOUNTER — Encounter: Payer: Self-pay | Admitting: Gastroenterology

## 2019-04-14 DIAGNOSIS — M3321 Polymyositis with respiratory involvement: Secondary | ICD-10-CM | POA: Diagnosis not present

## 2019-04-14 DIAGNOSIS — R740 Nonspecific elevation of levels of transaminase and lactic acid dehydrogenase [LDH]: Secondary | ICD-10-CM | POA: Diagnosis not present

## 2019-04-19 ENCOUNTER — Telehealth: Payer: Self-pay | Admitting: Pulmonary Disease

## 2019-04-19 ENCOUNTER — Encounter (HOSPITAL_COMMUNITY): Payer: Self-pay | Admitting: Emergency Medicine

## 2019-04-19 ENCOUNTER — Inpatient Hospital Stay (HOSPITAL_COMMUNITY)
Admission: EM | Admit: 2019-04-19 | Discharge: 2019-04-21 | DRG: 545 | Disposition: A | Discharge status: Home / Self Care | Payer: Medicare HMO | Attending: Internal Medicine | Admitting: Internal Medicine

## 2019-04-19 ENCOUNTER — Inpatient Hospital Stay (HOSPITAL_COMMUNITY): Payer: Medicare HMO

## 2019-04-19 ENCOUNTER — Emergency Department (HOSPITAL_COMMUNITY): Payer: Medicare HMO

## 2019-04-19 ENCOUNTER — Other Ambulatory Visit: Payer: Self-pay

## 2019-04-19 DIAGNOSIS — R768 Other specified abnormal immunological findings in serum: Secondary | ICD-10-CM | POA: Diagnosis present

## 2019-04-19 DIAGNOSIS — Z6828 Body mass index (BMI) 28.0-28.9, adult: Secondary | ICD-10-CM

## 2019-04-19 DIAGNOSIS — E782 Mixed hyperlipidemia: Secondary | ICD-10-CM | POA: Diagnosis present

## 2019-04-19 DIAGNOSIS — E119 Type 2 diabetes mellitus without complications: Secondary | ICD-10-CM | POA: Diagnosis present

## 2019-04-19 DIAGNOSIS — M1990 Unspecified osteoarthritis, unspecified site: Secondary | ICD-10-CM | POA: Diagnosis present

## 2019-04-19 DIAGNOSIS — Z20828 Contact with and (suspected) exposure to other viral communicable diseases: Secondary | ICD-10-CM | POA: Diagnosis not present

## 2019-04-19 DIAGNOSIS — M791 Myalgia, unspecified site: Secondary | ICD-10-CM | POA: Diagnosis not present

## 2019-04-19 DIAGNOSIS — J189 Pneumonia, unspecified organism: Secondary | ICD-10-CM | POA: Diagnosis not present

## 2019-04-19 DIAGNOSIS — N179 Acute kidney failure, unspecified: Secondary | ICD-10-CM | POA: Diagnosis not present

## 2019-04-19 DIAGNOSIS — M069 Rheumatoid arthritis, unspecified: Principal | ICD-10-CM | POA: Diagnosis present

## 2019-04-19 DIAGNOSIS — Z8249 Family history of ischemic heart disease and other diseases of the circulatory system: Secondary | ICD-10-CM

## 2019-04-19 DIAGNOSIS — K219 Gastro-esophageal reflux disease without esophagitis: Secondary | ICD-10-CM | POA: Diagnosis present

## 2019-04-19 DIAGNOSIS — I2510 Atherosclerotic heart disease of native coronary artery without angina pectoris: Secondary | ICD-10-CM | POA: Diagnosis present

## 2019-04-19 DIAGNOSIS — M359 Systemic involvement of connective tissue, unspecified: Secondary | ICD-10-CM | POA: Diagnosis not present

## 2019-04-19 DIAGNOSIS — E669 Obesity, unspecified: Secondary | ICD-10-CM | POA: Diagnosis not present

## 2019-04-19 DIAGNOSIS — E871 Hypo-osmolality and hyponatremia: Secondary | ICD-10-CM | POA: Diagnosis present

## 2019-04-19 DIAGNOSIS — Z831 Family history of other infectious and parasitic diseases: Secondary | ICD-10-CM | POA: Diagnosis not present

## 2019-04-19 DIAGNOSIS — A419 Sepsis, unspecified organism: Secondary | ICD-10-CM

## 2019-04-19 DIAGNOSIS — R509 Fever, unspecified: Secondary | ICD-10-CM | POA: Diagnosis present

## 2019-04-19 DIAGNOSIS — Z7952 Long term (current) use of systemic steroids: Secondary | ICD-10-CM | POA: Diagnosis not present

## 2019-04-19 DIAGNOSIS — R9389 Abnormal findings on diagnostic imaging of other specified body structures: Secondary | ICD-10-CM | POA: Diagnosis not present

## 2019-04-19 DIAGNOSIS — J479 Bronchiectasis, uncomplicated: Secondary | ICD-10-CM | POA: Diagnosis not present

## 2019-04-19 DIAGNOSIS — Z823 Family history of stroke: Secondary | ICD-10-CM

## 2019-04-19 DIAGNOSIS — Z832 Family history of diseases of the blood and blood-forming organs and certain disorders involving the immune mechanism: Secondary | ICD-10-CM | POA: Diagnosis not present

## 2019-04-19 DIAGNOSIS — Z7984 Long term (current) use of oral hypoglycemic drugs: Secondary | ICD-10-CM

## 2019-04-19 DIAGNOSIS — Z79899 Other long term (current) drug therapy: Secondary | ICD-10-CM

## 2019-04-19 DIAGNOSIS — E785 Hyperlipidemia, unspecified: Secondary | ICD-10-CM | POA: Diagnosis present

## 2019-04-19 DIAGNOSIS — R531 Weakness: Secondary | ICD-10-CM | POA: Diagnosis not present

## 2019-04-19 DIAGNOSIS — Z7982 Long term (current) use of aspirin: Secondary | ICD-10-CM | POA: Diagnosis not present

## 2019-04-19 DIAGNOSIS — Z885 Allergy status to narcotic agent status: Secondary | ICD-10-CM

## 2019-04-19 DIAGNOSIS — Z85828 Personal history of other malignant neoplasm of skin: Secondary | ICD-10-CM

## 2019-04-19 DIAGNOSIS — I351 Nonrheumatic aortic (valve) insufficiency: Secondary | ICD-10-CM | POA: Diagnosis present

## 2019-04-19 DIAGNOSIS — E1169 Type 2 diabetes mellitus with other specified complication: Secondary | ICD-10-CM | POA: Diagnosis not present

## 2019-04-19 DIAGNOSIS — Z888 Allergy status to other drugs, medicaments and biological substances status: Secondary | ICD-10-CM

## 2019-04-19 DIAGNOSIS — Z881 Allergy status to other antibiotic agents status: Secondary | ICD-10-CM | POA: Diagnosis not present

## 2019-04-19 DIAGNOSIS — R6510 Systemic inflammatory response syndrome (SIRS) of non-infectious origin without acute organ dysfunction: Secondary | ICD-10-CM | POA: Diagnosis present

## 2019-04-19 DIAGNOSIS — I959 Hypotension, unspecified: Secondary | ICD-10-CM | POA: Diagnosis present

## 2019-04-19 DIAGNOSIS — I10 Essential (primary) hypertension: Secondary | ICD-10-CM | POA: Diagnosis present

## 2019-04-19 DIAGNOSIS — Z86010 Personal history of colonic polyps: Secondary | ICD-10-CM

## 2019-04-19 DIAGNOSIS — Z87442 Personal history of urinary calculi: Secondary | ICD-10-CM

## 2019-04-19 DIAGNOSIS — Z66 Do not resuscitate: Secondary | ICD-10-CM | POA: Diagnosis not present

## 2019-04-19 DIAGNOSIS — Z800 Family history of malignant neoplasm of digestive organs: Secondary | ICD-10-CM | POA: Diagnosis not present

## 2019-04-19 DIAGNOSIS — J849 Interstitial pulmonary disease, unspecified: Secondary | ICD-10-CM

## 2019-04-19 DIAGNOSIS — I119 Hypertensive heart disease without heart failure: Secondary | ICD-10-CM | POA: Diagnosis present

## 2019-04-19 HISTORY — DX: Sepsis, unspecified organism: A41.9

## 2019-04-19 HISTORY — DX: Myalgia, unspecified site: M79.10

## 2019-04-19 LAB — COMPREHENSIVE METABOLIC PANEL
ALT: 28 U/L (ref 0–44)
AST: 18 U/L (ref 15–41)
Albumin: 2.4 g/dL — ABNORMAL LOW (ref 3.5–5.0)
Alkaline Phosphatase: 93 U/L (ref 38–126)
Anion gap: 11 (ref 5–15)
BUN: 26 mg/dL — ABNORMAL HIGH (ref 8–23)
CO2: 21 mmol/L — ABNORMAL LOW (ref 22–32)
Calcium: 7.9 mg/dL — ABNORMAL LOW (ref 8.9–10.3)
Chloride: 97 mmol/L — ABNORMAL LOW (ref 98–111)
Creatinine, Ser: 1.43 mg/dL — ABNORMAL HIGH (ref 0.61–1.24)
GFR calc Af Amer: 53 mL/min — ABNORMAL LOW (ref >60)
GFR calc non Af Amer: 46 mL/min — ABNORMAL LOW (ref >60)
Glucose, Bld: 299 mg/dL — ABNORMAL HIGH (ref 70–99)
Potassium: 4.3 mmol/L (ref 3.5–5.1)
Sodium: 129 mmol/L — ABNORMAL LOW (ref 135–145)
Total Bilirubin: 0.9 mg/dL (ref 0.3–1.2)
Total Protein: 5.5 g/dL — ABNORMAL LOW (ref 6.5–8.1)

## 2019-04-19 LAB — URINALYSIS, ROUTINE W REFLEX MICROSCOPIC
Bilirubin Urine: NEGATIVE
Glucose, UA: NEGATIVE mg/dL
Hgb urine dipstick: NEGATIVE
Ketones, ur: NEGATIVE mg/dL
Leukocytes,Ua: NEGATIVE
Nitrite: NEGATIVE
Protein, ur: NEGATIVE mg/dL
Specific Gravity, Urine: 1.011 (ref 1.005–1.030)
pH: 5 (ref 5.0–8.0)

## 2019-04-19 LAB — CBC WITH DIFFERENTIAL/PLATELET
Abs Immature Granulocytes: 0.16 10*3/uL — ABNORMAL HIGH (ref 0.00–0.07)
Basophils Absolute: 0.1 10*3/uL (ref 0.0–0.1)
Basophils Relative: 0 %
Eosinophils Absolute: 0.3 10*3/uL (ref 0.0–0.5)
Eosinophils Relative: 2 %
HCT: 33 % — ABNORMAL LOW (ref 39.0–52.0)
Hemoglobin: 10.5 g/dL — ABNORMAL LOW (ref 13.0–17.0)
Immature Granulocytes: 1 %
Lymphocytes Relative: 8 %
Lymphs Abs: 1.3 10*3/uL (ref 0.7–4.0)
MCH: 28.7 pg (ref 26.0–34.0)
MCHC: 31.8 g/dL (ref 30.0–36.0)
MCV: 90.2 fL (ref 80.0–100.0)
Monocytes Absolute: 1.1 10*3/uL — ABNORMAL HIGH (ref 0.1–1.0)
Monocytes Relative: 7 %
Neutro Abs: 13 10*3/uL — ABNORMAL HIGH (ref 1.7–7.7)
Neutrophils Relative %: 82 %
Platelets: 312 10*3/uL (ref 150–400)
RBC: 3.66 MIL/uL — ABNORMAL LOW (ref 4.22–5.81)
RDW: 14.2 % (ref 11.5–15.5)
WBC: 15.8 10*3/uL — ABNORMAL HIGH (ref 4.0–10.5)
nRBC: 0 % (ref 0.0–0.2)

## 2019-04-19 LAB — CBG MONITORING, ED
Glucose-Capillary: 173 mg/dL — ABNORMAL HIGH (ref 70–99)
Glucose-Capillary: 239 mg/dL — ABNORMAL HIGH (ref 70–99)

## 2019-04-19 LAB — ALPHA-1 ANTITRYPSIN PHENOTYPE: A-1 Antitrypsin, Ser: 124 mg/dL (ref 83–199)

## 2019-04-19 LAB — LACTIC ACID, PLASMA
Lactic Acid, Venous: 2.6 mmol/L (ref 0.5–1.9)
Lactic Acid, Venous: 1.8 mmol/L (ref 0.5–1.9)

## 2019-04-19 LAB — C-REACTIVE PROTEIN: CRP: 13.5 mg/dL — ABNORMAL HIGH (ref <1.0)

## 2019-04-19 LAB — SARS CORONAVIRUS 2 (TAT 6-24 HRS): SARS Coronavirus 2: NEGATIVE

## 2019-04-19 LAB — SEDIMENTATION RATE: Sed Rate: 39 mm/hr — ABNORMAL HIGH (ref 0–16)

## 2019-04-19 LAB — PROCALCITONIN: Procalcitonin: <0.1 ng/mL

## 2019-04-19 MED ORDER — INSULIN ASPART 100 UNIT/ML ~~LOC~~ SOLN
0.0000 [IU] | Freq: Three times a day (TID) | SUBCUTANEOUS | Status: DC
  Administered 2019-04-19 – 2019-04-20 (×2): 2 [IU] via SUBCUTANEOUS
  Administered 2019-04-20: 7 [IU] via SUBCUTANEOUS
  Administered 2019-04-20: 5 [IU] via SUBCUTANEOUS

## 2019-04-19 MED ORDER — SODIUM CHLORIDE 0.9 % IV SOLN
INTRAVENOUS | Status: DC
  Administered 2019-04-19: 18:00:00 via INTRAVENOUS

## 2019-04-19 MED ORDER — SODIUM CHLORIDE 0.9 % IV BOLUS (SEPSIS)
500.0000 mL | Freq: Once | INTRAVENOUS | Status: AC
  Administered 2019-04-19: 500 mL via INTRAVENOUS

## 2019-04-19 MED ORDER — SODIUM CHLORIDE 0.9 % IV BOLUS (SEPSIS)
1000.0000 mL | Freq: Once | INTRAVENOUS | Status: AC
  Administered 2019-04-19: 15:00:00 1000 mL via INTRAVENOUS

## 2019-04-19 MED ORDER — SODIUM CHLORIDE 0.9% FLUSH
3.0000 mL | Freq: Two times a day (BID) | INTRAVENOUS | Status: DC
  Administered 2019-04-19 – 2019-04-20 (×3): 3 mL via INTRAVENOUS

## 2019-04-19 MED ORDER — PROSIGHT PO TABS
1.0000 | ORAL_TABLET | Freq: Every day | ORAL | Status: DC
  Administered 2019-04-20 – 2019-04-21 (×2): 1 via ORAL
  Filled 2019-04-19 (×2): qty 1

## 2019-04-19 MED ORDER — VANCOMYCIN HCL 10 G IV SOLR
1500.0000 mg | Freq: Once | INTRAVENOUS | Status: AC
  Administered 2019-04-19: 1500 mg via INTRAVENOUS
  Filled 2019-04-19: qty 1500

## 2019-04-19 MED ORDER — SODIUM CHLORIDE 0.9% FLUSH: 3.0000 mL | Freq: Once | INTRAVENOUS | Status: DC

## 2019-04-19 MED ORDER — ONDANSETRON HCL 4 MG/2ML IJ SOLN: 4.0000 mg | Freq: Four times a day (QID) | INTRAMUSCULAR | Status: DC | PRN

## 2019-04-19 MED ORDER — ASPIRIN EC 81 MG PO TBEC
162.0000 mg | DELAYED_RELEASE_TABLET | Freq: Every day | ORAL | Status: DC
  Administered 2019-04-20 – 2019-04-21 (×2): 162 mg via ORAL
  Filled 2019-04-19 (×2): qty 2

## 2019-04-19 MED ORDER — ACETAMINOPHEN 650 MG RE SUPP: 650.0000 mg | Freq: Four times a day (QID) | RECTAL | Status: DC | PRN

## 2019-04-19 MED ORDER — ACETAMINOPHEN 325 MG PO TABS: 650.0000 mg | ORAL_TABLET | Freq: Four times a day (QID) | ORAL | Status: DC | PRN

## 2019-04-19 MED ORDER — VANCOMYCIN HCL IN DEXTROSE 1-5 GM/200ML-% IV SOLN
1000.0000 mg | INTRAVENOUS | Status: DC
  Filled 2019-04-19: qty 200

## 2019-04-19 MED ORDER — ONDANSETRON HCL 4 MG PO TABS: 4.0000 mg | ORAL_TABLET | Freq: Four times a day (QID) | ORAL | Status: DC | PRN

## 2019-04-19 MED ORDER — PANTOPRAZOLE SODIUM 40 MG PO TBEC
40.0000 mg | DELAYED_RELEASE_TABLET | Freq: Every day | ORAL | Status: DC
  Administered 2019-04-20 – 2019-04-21 (×2): 40 mg via ORAL
  Filled 2019-04-19 (×2): qty 1

## 2019-04-19 MED ORDER — POLYETHYLENE GLYCOL 3350 17 G PO PACK: 17.0000 g | PACK | Freq: Every day | ORAL | Status: DC | PRN

## 2019-04-19 MED ORDER — METRONIDAZOLE IN NACL 5-0.79 MG/ML-% IV SOLN
500.0000 mg | Freq: Once | INTRAVENOUS | Status: AC
  Administered 2019-04-19: 500 mg via INTRAVENOUS
  Filled 2019-04-19: qty 100

## 2019-04-19 MED ORDER — PRESERVISION AREDS 2+MULTI VIT PO CAPS: 1.0000 | ORAL_CAPSULE | Freq: Two times a day (BID) | ORAL | Status: DC

## 2019-04-19 MED ORDER — TRAZODONE HCL 150 MG PO TABS
75.0000 mg | ORAL_TABLET | Freq: Every day | ORAL | Status: DC
  Administered 2019-04-19 – 2019-04-20 (×2): 75 mg via ORAL
  Filled 2019-04-19: qty 2
  Filled 2019-04-19: qty 1

## 2019-04-19 MED ORDER — SODIUM CHLORIDE 0.9 % IV SOLN
2.0000 g | Freq: Two times a day (BID) | INTRAVENOUS | Status: DC
  Administered 2019-04-20: 2 g via INTRAVENOUS
  Filled 2019-04-19 (×2): qty 2

## 2019-04-19 MED ORDER — DOCUSATE SODIUM 100 MG PO CAPS
100.0000 mg | ORAL_CAPSULE | Freq: Two times a day (BID) | ORAL | Status: DC
  Administered 2019-04-19 – 2019-04-21 (×4): 100 mg via ORAL
  Filled 2019-04-19 (×4): qty 1

## 2019-04-19 MED ORDER — SODIUM CHLORIDE 0.9 % IV SOLN
2.0000 g | Freq: Once | INTRAVENOUS | Status: AC
  Administered 2019-04-19: 2 g via INTRAVENOUS
  Filled 2019-04-19: qty 2

## 2019-04-19 MED ORDER — ENOXAPARIN SODIUM 40 MG/0.4ML ~~LOC~~ SOLN
40.0000 mg | SUBCUTANEOUS | Status: DC
  Administered 2019-04-20: 40 mg via SUBCUTANEOUS
  Filled 2019-04-19: qty 0.4

## 2019-04-19 MED ORDER — INSULIN ASPART 100 UNIT/ML ~~LOC~~ SOLN
0.0000 [IU] | Freq: Every day | SUBCUTANEOUS | Status: DC
  Administered 2019-04-19: 2 [IU] via SUBCUTANEOUS
  Administered 2019-04-20: 3 [IU] via SUBCUTANEOUS

## 2019-04-19 MED ORDER — SODIUM CHLORIDE 0.9 % IV BOLUS
1000.0000 mL | Freq: Once | INTRAVENOUS | Status: AC
  Administered 2019-04-19: 1000 mL via INTRAVENOUS

## 2019-04-19 MED ORDER — HYDROCORTISONE NA SUCCINATE PF 100 MG IJ SOLR
50.0000 mg | Freq: Four times a day (QID) | INTRAMUSCULAR | Status: DC
  Administered 2019-04-19 – 2019-04-20 (×4): 50 mg via INTRAVENOUS
  Filled 2019-04-19 (×4): qty 2

## 2019-04-19 MED ORDER — VANCOMYCIN HCL IN DEXTROSE 1-5 GM/200ML-% IV SOLN: 1000.0000 mg | Freq: Once | INTRAVENOUS | Status: DC

## 2019-04-19 NOTE — ED Notes (Signed)
Patient ambulated to the bathroom (supervision)

## 2019-04-19 NOTE — Progress Notes (Addendum)
Pharmacy Antibiotic Note  Kerry Matthews is a 83 y.o. male admitted on 04/19/2019 with sepsis. Recently admitted on 8/7 for pneumonitis and received 2 doses of azithromycin/Rocephin before abx was D/Ced due to negative cxs. On pred 20mg  daily PTA. Pharmacy has been consulted for vancomycin/cefepime dosing.  Pt reports having fever since July, temp was 101 this AM at home, currently afebrile. WBC elevated 15.8 in the setting of recent steroid use. LA elevated 2.6. Scr 1.43 elevated from baseline (~1.1).  Plan: Vancomycin 1500 mg IV loading dose Vancomycin 1000 mg IV Q 24 hrs. Goal AUC 400-550. Expected AUC: 460.9 SCr used: 1.43 Cefepime 2 gm IV q12h Monitor renal fxn, cxs, clinical improvement, and abx de-escalation as needed  Height: 5\' 8"  (172.7 cm) Weight: 180 lb (81.6 kg) IBW/kg (Calculated) : 68.4  Temp (24hrs), Avg:98.4 F (36.9 C), Min:98.4 F (36.9 C), Max:98.4 F (36.9 C)  Recent Labs  Lab 04/19/19 0940 04/19/19 1330  WBC 15.8*  --   CREATININE 1.43*  --   LATICACIDVEN  --  2.6*    Estimated Creatinine Clearance: 39.2 mL/min (A) (by C-G formula based on SCr of 1.43 mg/dL (H)).    Allergies  Allergen Reactions  . Ace Inhibitors Other (See Comments)    Slow heart rate  . Hydrocodone Nausea And Vomiting  . Hydrocodone-Acetaminophen Nausea And Vomiting  . Sulfamethoxazole-Trimethoprim     Got sicker    Antimicrobials this admission: 8/25 vanc >> 8/25 cefepime >> 8/25 Flagyl >>  Microbiology results: 8/25 BCx: sent 8/25 UCx: sent  Thank you for allowing pharmacy to be a part of this patient's care.  Berenice Bouton, PharmD PGY1 Pharmacy Resident Office phone: [phone] Phone until 9:30 pm: [phone] 04/19/2019 2:10 PM

## 2019-04-19 NOTE — ED Notes (Signed)
Report attempted 

## 2019-04-19 NOTE — ED Triage Notes (Signed)
Pt states he has been seen 2-3 times in the last 3 weeks for the same symptoms really and he just seems to being getting worse. Pt states he has had 3 - negative covid tests but contunies to have fevers last night was 101 and reports feeling weak all over. Pt denies any pain or sob. Pt denies any dizziness.

## 2019-04-19 NOTE — Telephone Encounter (Signed)
04/19/2019 [place],   Pt will need outpatient follow up when discharged from ED/Hospital.   Can you keep in your box to call the pt and coordinate when he is out?  Wyn Quaker FNP

## 2019-04-19 NOTE — ED Provider Notes (Signed)
Kerry Matthews   CSN: [id number] Arrival date & time: 04/19/19  0915     History   Chief Complaint Chief Complaint  Patient presents with  . Weakness  . Fever    HPI Kerry Matthews is a 83 y.o. male.     HPI  83 year old male presents for fever. Has been having fever since July. Recently admitted earlier this month, was on antibiotics. No fevers at home since, though over past few days has had low grade 99 temps. This morning temp was 101 and he had myalgias. Also felt overall weak.  Wife notes he has been feeling poorly over the last couple days but no specific symptoms.  No new symptoms otherwise including no shortness of breath, chest pain, abdominal pain, pain anywhere besides the brief myalgias and slight headache this morning, and no rash.  He has a chronic cough that is unchanged. He is due to see a rheumatologist in 2 days.  Past Medical History:  Diagnosis Date  . Coronary artery disease   . Diabetes ([place])   . Hyperlipidemia   . Hypertension   . Osteoarthritis   . Renal stones   . Skin cancer     Patient Active Problem List   Diagnosis Date Noted  . Sepsis ([place]) 04/19/2019  . History of alpha-1-antitrypsin deficiency 04/12/2019  . Elevated rheumatoid factor 04/12/2019  . Interstitial pulmonary disease ([place]) 04/12/2019  . Abnormal ANCA test 04/12/2019  . Abnormal findings on diagnostic imaging of lung 04/12/2019  . Transaminitis   . Hypoxemia   . Polymyositis ([place])   . Low vitamin B12 level 04/03/2019  . Community acquired pneumonia   . Pneumonitis   . Cough   . Fever   . Abnormal CT of the chest   . Lower extremity edema   . AKI (acute kidney injury) ([place])   . Anemia   . Pneumonia 04/01/2019  . Elevated LFTs 04/01/2019  . Diabetes mellitus type 2 in obese ([place]) 04/01/2019  . Diabetes ([place]) 05/03/2018  . Hypertension 05/03/2018  . Hyperlipidemia 05/03/2018  . Atherosclerotic heart disease of native  coronary artery without angina pectoris 05/03/2018  . GERD (gastroesophageal reflux disease) 05/03/2018  . Trigger finger 05/03/2018  . Bradycardia 05/03/2018  . CKD (chronic kidney disease) 05/03/2018  . Insomnia 05/03/2018  . Emphysema (subcutaneous) (surgical) resulting from a procedure 05/03/2018    Past Surgical History:  Procedure Laterality Date  . BACK SURGERY    . CATARACT EXTRACTION    . COLONOSCOPY  06/16/2005   Mild colitis involving splenic flexure. Colonic polyps, status post polypectomy. Mild pancolonic diverticulitits. Internal hemorrhoids.   . ESOPHAGOGASTRODUODENOSCOPY  04/26/2003   Irregular Z line suggestive of GERD. Mild gastritis status post CLO testing.   Marland Kitchen TRIGGER FINGER RELEASE          Home Medications    Prior to Admission medications   Medication Sig Start Date End Date Taking? Authorizing Provider  aspirin 81 MG EC tablet Take 162 mg by mouth daily.    Yes [provider]  Coenzyme Q10 (CO Q 10) 10 MG CAPS Take 1 capsule by mouth daily.   Yes [provider]  furosemide (LASIX) 40 MG tablet Take 20 mg by mouth daily.    Yes [provider]  glipiZIDE (GLUCOTROL) 5 MG tablet Take 1 tablet (5 mg total) by mouth 2 (two) times daily. 04/06/19 05/06/19 Yes Vann, Jessica U, DO  losartan (COZAAR) 50 MG tablet  Take 1 tablet by mouth daily.  12/06/18  Yes [provider]  metFORMIN (GLUCOPHAGE) 500 MG tablet Take 1 tablet (500 mg total) by mouth 2 (two) times daily with a meal. Patient taking differently: Take 500 mg by mouth 3 (three) times daily.  04/06/19  Yes Geradine Girt, DO  Multiple Vitamins-Minerals (PRESERVISION AREDS 2+MULTI VIT) CAPS Take 1 capsule by mouth 2 (two) times daily.   Yes [provider]  nitroGLYCERIN (NITROSTAT) 0.4 MG SL tablet Place 0.4 mg under the tongue every 5 (five) minutes as needed.   Yes [provider]  Omega-3 Fatty Acids (FISH OIL) 1000 MG CAPS Take 1,000 mg by mouth 2  (two) times daily.    Yes [provider]  omeprazole (PRILOSEC) 20 MG capsule Take 20 mg by mouth daily.   Yes [provider]  predniSONE (DELTASONE) 10 MG tablet 30 mg PO x 5 days then 20 mg PO until after rheumatology and/or pulmonology follow up Patient taking differently: Take 20 mg by mouth daily. 20 mg PO until after rheumatology and/or pulmonology follow up 04/06/19  Yes Eulogio Bear U, DO  traZODone (DESYREL) 50 MG tablet Take 75 mg by mouth at bedtime.    Yes [provider]  vitamin B-12 (CYANOCOBALAMIN) 1000 MCG tablet Take 1 tablet (1,000 mcg total) by mouth daily. 04/06/19  Yes Geradine Girt, DO    Family History Family History  Problem Relation Age of Onset  . Tuberculosis Mother   . Stroke Father   . Pancreatic cancer Sister   . Heart attack Sister   . Lung disease Sister   . Clotting disorder Brother     Social History Social History   Tobacco Use  . Smoking status: Never Smoker  . Smokeless tobacco: Never Used  Substance Use Topics  . Alcohol use: Never    Frequency: Never  . Drug use: Never     Allergies   Ace inhibitors, Hydrocodone, Hydrocodone-acetaminophen, and Sulfamethoxazole-trimethoprim   Review of Systems Review of Systems  Constitutional: Positive for fever.  Respiratory: Positive for cough. Negative for shortness of breath.   Cardiovascular: Negative for chest pain.  Gastrointestinal: Negative for abdominal pain, diarrhea and vomiting.  Genitourinary: Negative for dysuria.  Musculoskeletal: Positive for myalgias.  Skin: Negative for rash.  Neurological: Positive for headaches.  All other systems reviewed and are negative.    Physical Exam Updated Vital Signs BP 120/65   Pulse (!) 57   Temp 98.4 F (36.9 C) (Oral)   Resp (!) 23   Ht 5\' 8"  (1.727 m)   Wt 81.6 kg   SpO2 98%   BMI 27.37 kg/m   Physical Exam Vitals signs and nursing Matthews reviewed.  Constitutional:      General: He is not in acute  distress.    Appearance: He is well-developed. He is not ill-appearing or diaphoretic.  HENT:     Head: Normocephalic and atraumatic.     Right Ear: External ear normal.     Left Ear: External ear normal.     Nose: Nose normal.  Eyes:     General:        Right eye: No discharge.        Left eye: No discharge.  Neck:     Musculoskeletal: Normal range of motion and neck supple. No neck rigidity.  Cardiovascular:     Rate and Rhythm: Normal rate and regular rhythm.     Heart sounds: Normal heart sounds. No murmur.  Pulmonary:     Effort: Pulmonary effort is normal.     Breath sounds: Normal breath sounds.  Abdominal:     Palpations: Abdomen is soft.     Tenderness: There is no abdominal tenderness.  Skin:    General: Skin is warm and dry.     Findings: No erythema or rash.  Neurological:     Mental Status: He is alert.  Psychiatric:        Mood and Affect: Mood is not anxious.      ED Treatments / Results  Labs (all labs ordered are listed, but only abnormal results are displayed) Labs Reviewed  LACTIC ACID, PLASMA - Abnormal; Notable for the following components:      Result Value   Lactic Acid, Venous 2.6 (*)    All other components within normal limits  COMPREHENSIVE METABOLIC PANEL - Abnormal; Notable for the following components:   Sodium 129 (*)    Chloride 97 (*)    CO2 21 (*)    Glucose, Bld 299 (*)    BUN 26 (*)    Creatinine, Ser 1.43 (*)    Calcium 7.9 (*)    Total Protein 5.5 (*)    Albumin 2.4 (*)    GFR calc non Af Amer 46 (*)    GFR calc Af Amer 53 (*)    All other components within normal limits  CBC WITH DIFFERENTIAL/PLATELET - Abnormal; Notable for the following components:   WBC 15.8 (*)    RBC 3.66 (*)    Hemoglobin 10.5 (*)    HCT 33.0 (*)    Neutro Abs 13.0 (*)    Monocytes Absolute 1.1 (*)    Abs Immature Granulocytes 0.16 (*)    All other components within normal limits  CULTURE, BLOOD (ROUTINE X 2)  CULTURE, BLOOD (ROUTINE X 2)   URINE CULTURE  SARS CORONAVIRUS 2 (TAT 6-12 HRS)  URINALYSIS, ROUTINE W REFLEX MICROSCOPIC  LACTIC ACID, PLASMA    EKG None  Radiology Dg Chest 2 View  Result Date: 04/19/2019 CLINICAL DATA:  Fever.  Weakness.  Dizziness. EXAM: CHEST - 2 VIEW COMPARISON:  CT 04/01/2019.  Chest x-ray 04/01/2019. FINDINGS: Prominence of the anterior mediastinum is stable and consistent with prominent mediastinal fat as noted on prior CT. Hilar structures normal. Stable bilateral mild interstitial prominence consistent chronic interstitial lung disease again noted. Lungs are clear of acute infiltrates r. No pleural effusion or pneumothorax. Cardiomegaly with normal pulmonary vascularity. Degenerative changes thoracic spine. IMPRESSION: No acute cardiopulmonary disease. Electronically Signed   By: Marcello Moores  Register   On: 04/19/2019 13:09    Procedures .Critical Care Performed by: Sherwood Gambler, MD Authorized by: Sherwood Gambler, MD   Critical care provider statement:    Critical care time (minutes):  30   Critical care time was exclusive of:  Separately billable procedures and treating other patients   Critical care was necessary to treat or prevent imminent or life-threatening deterioration of the following conditions:  Sepsis   Critical care was time spent personally by me on the following activities:  Discussions with consultants, evaluation of patient's response to treatment, examination of patient, ordering and performing treatments and interventions, ordering and review of laboratory studies, ordering and review of radiographic studies, pulse oximetry, re-evaluation of patient's condition, obtaining history from patient or surrogate and review of old charts   (including critical care time)  Medications Ordered in ED Medications  sodium chloride flush (NS) 0.9 % injection 3 mL (has no administration  in time range)  metroNIDAZOLE (FLAGYL) IVPB 500 mg (500 mg Intravenous New Bag/Given 04/19/19 1453)   vancomycin (VANCOCIN) 1,500 mg in sodium chloride 0.9 % 500 mL IVPB (has no administration in time range)  vancomycin (VANCOCIN) IVPB 1000 mg/200 mL premix (has no administration in time range)  ceFEPIme (MAXIPIME) 2 g in sodium chloride 0.9 % 100 mL IVPB (has no administration in time range)  sodium chloride 0.9 % bolus 1,000 mL (1,000 mLs Intravenous New Bag/Given 04/19/19 1520)    And  sodium chloride 0.9 % bolus 500 mL (500 mLs Intravenous New Bag/Given 04/19/19 1520)  sodium chloride 0.9 % bolus 1,000 mL (1,000 mLs Intravenous New Bag/Given 04/19/19 1344)  ceFEPIme (MAXIPIME) 2 g in sodium chloride 0.9 % 100 mL IVPB (2 g Intravenous New Bag/Given 04/19/19 1448)     Initial Impression / Assessment and Plan / ED Course  I have reviewed the triage vital signs and the nursing notes.  Pertinent labs & imaging results that were available during my care of the patient were reviewed by me and considered in my medical decision making (see chart for details).        Unclear cause of patient's fever.  However with his soft blood pressures, lactic acid of 2.6, and fever at home I think he will need admission with further treatment.  I have discussed with Dr. Megan Salon of ID who will see.  At this point I do not think we can hold off on antibiotics however given some MAPs under 65 and a lactate.  Dr. Lorin Mercy asked for high resolution CT of chest which has been ordered.  Kerry Matthews was evaluated in Emergency Department on 04/19/2019 for the symptoms described in the history of present illness. He was evaluated in the context of the global COVID-19 pandemic, which necessitated consideration that the patient might be at risk for infection with the SARS-CoV-2 virus that causes COVID-19. Institutional protocols and algorithms that pertain to the evaluation of patients at risk for COVID-19 are in a state of rapid change based on information released by regulatory bodies including the CDC and federal and state  organizations. These policies and algorithms were followed during the patient's care in the ED.   Final Clinical Impressions(s) / ED Diagnoses   Final diagnoses:  Fever, unknown origin    ED Discharge Orders    None       Sherwood Gambler, MD 04/19/19 1524

## 2019-04-19 NOTE — ED Notes (Signed)
[phone] [name] Wife

## 2019-04-19 NOTE — ED Notes (Signed)
ED TO INPATIENT HANDOFF REPORT  ED Nurse Name and Phone #:   S Name/Age/Gender Kerry Matthews 83 y.o. male Room/Bed: 020C/020C  Code Status   Code Status: DNR  Home/SNF/Other Dc home AO x 4   Triage Complete: Triage complete  Chief Complaint fever  Triage Note Pt states he has been seen 2-3 times in the last 3 weeks for the same symptoms really and he just seems to being getting worse. Pt states he has had 3 - negative covid tests but contunies to have fevers last night was 101 and reports feeling weak all over. Pt denies any pain or sob. Pt denies any dizziness.    Allergies Allergies  Allergen Reactions  . Ace Inhibitors Other (See Comments)    Slow heart rate  . Hydrocodone Nausea And Vomiting  . Hydrocodone-Acetaminophen Nausea And Vomiting  . Sulfamethoxazole-Trimethoprim     Got sicker    Level of Care/Admitting Diagnosis ED Disposition    ED Disposition Condition [facility]: [place] [[id number]]  Level of Care: Progressive [102]  Covid Evaluation: Person Under Investigation (PUI)  [facility]osis: [facility]is [id number]  Admitting Physician: Karmen Bongo [[id number]]  Attending Physician: Karmen Bongo [[id number]]  Estimated length of stay: 3 - 4 days  Certification:: I certify this patient will need inpatient services for at least 2 midnights  PT Class (Do Not Modify): Inpatient [101]  PT Acc Code (Do Not Modify): Private [1]       B Medical/Surgery History Past Medical History:  Diagnosis Date  . Coronary artery disease   . Diabetes ([place])   . Hyperlipidemia   . Hypertension   . Osteoarthritis   . Renal stones   . Skin cancer    Past Surgical History:  Procedure Laterality Date  . BACK SURGERY    . CATARACT EXTRACTION    . COLONOSCOPY  06/16/2005   Mild colitis involving splenic flexure. Colonic polyps, status post polypectomy. Mild pancolonic diverticulitits. Internal hemorrhoids.   . ESOPHAGOGASTRODUODENOSCOPY   04/26/2003   Irregular Z line suggestive of GERD. Mild gastritis status post CLO testing.   Marland Kitchen TRIGGER FINGER RELEASE       A IV Location/Drains/Wounds Patient Lines/Drains/Airways Status   Active Line/Drains/Airways    Name:   Placement date:   Placement time:   Site:   Days:   Peripheral IV 04/19/19 Left Antecubital   04/19/19    1318    Antecubital   less than 1   Peripheral IV 04/19/19 Right Forearm   04/19/19    1516    Forearm   less than 1          Intake/Output Last 24 hours No intake or output data in the 24 hours ending 04/19/19 1929  Labs/Imaging Results for orders placed or performed during the hospital encounter of 04/19/19 (from the past 48 hour(s))  Comprehensive metabolic panel     Status: Abnormal   Collection Time: 04/19/19  9:40 AM  Result Value Ref Range   Sodium 129 (L) 135 - 145 mmol/L   Potassium 4.3 3.5 - 5.1 mmol/L   Chloride 97 (L) 98 - 111 mmol/L   CO2 21 (L) 22 - 32 mmol/L   Glucose, Bld 299 (H) 70 - 99 mg/dL   BUN 26 (H) 8 - 23 mg/dL   Creatinine, Ser 1.43 (H) 0.61 - 1.24 mg/dL   Calcium 7.9 (L) 8.9 - 10.3 mg/dL   Total Protein 5.5 (L) 6.5 - 8.1 g/dL  Albumin 2.4 (L) 3.5 - 5.0 g/dL   AST 18 15 - 41 U/L   ALT 28 0 - 44 U/L   Alkaline Phosphatase 93 38 - 126 U/L   Total Bilirubin 0.9 0.3 - 1.2 mg/dL   GFR calc non Af Amer 46 (L) >60 mL/min   GFR calc Af Amer 53 (L) >60 mL/min   Anion gap 11 5 - 15    Comment: Performed at [facility] [address] [place], [place] [address]  CBC with Differential     Status: Abnormal   Collection Time: 04/19/19  9:40 AM  Result Value Ref Range   WBC 15.8 (H) 4.0 - 10.5 K/uL   RBC 3.66 (L) 4.22 - 5.81 MIL/uL   Hemoglobin 10.5 (L) 13.0 - 17.0 g/dL   HCT 33.0 (L) 39.0 - 52.0 %   MCV 90.2 80.0 - 100.0 fL   MCH 28.7 26.0 - 34.0 pg   MCHC 31.8 30.0 - 36.0 g/dL   RDW 14.2 11.5 - 15.5 %   Platelets 312 150 - 400 K/uL   nRBC 0.0 0.0 - 0.2 %   Neutrophils Relative % 82 %   Neutro Abs 13.0 (H) 1.7 -  7.7 K/uL   Lymphocytes Relative 8 %   Lymphs Abs 1.3 0.7 - 4.0 K/uL   Monocytes Relative 7 %   Monocytes Absolute 1.1 (H) 0.1 - 1.0 K/uL   Eosinophils Relative 2 %   Eosinophils Absolute 0.3 0.0 - 0.5 K/uL   Basophils Relative 0 %   Basophils Absolute 0.1 0.0 - 0.1 K/uL   Immature Granulocytes 1 %   Abs Immature Granulocytes 0.16 (H) 0.00 - 0.07 K/uL    Comment: Performed at [facility] [address], [place], [place] [address]  Lactic acid, plasma     Status: Abnormal   Collection Time: 04/19/19  1:30 PM  Result Value Ref Range   Lactic Acid, Venous 2.6 (HH) 0.5 - 1.9 mmol/L    Comment: CRITICAL RESULT CALLED TO, READ BACK BY AND VERIFIED WITH: K. KIRK,RN AT [id number] 04/19/2019 BY ZBEECH. Performed at [facility], [place] [address], [place], [place] [address]   Urinalysis, Routine w reflex microscopic     Status: None   Collection Time: 04/19/19  1:46 PM  Result Value Ref Range   Color, Urine YELLOW YELLOW   APPearance CLEAR CLEAR   Specific Gravity, Urine 1.011 1.005 - 1.030   pH 5.0 5.0 - 8.0   Glucose, UA NEGATIVE NEGATIVE mg/dL   Hgb urine dipstick NEGATIVE NEGATIVE   Bilirubin Urine NEGATIVE NEGATIVE   Ketones, ur NEGATIVE NEGATIVE mg/dL   Protein, ur NEGATIVE NEGATIVE mg/dL   Nitrite NEGATIVE NEGATIVE   Leukocytes,Ua NEGATIVE NEGATIVE    Comment: Performed at [facility] [address], [place], [place] [address]  Lactic acid, plasma     Status: None   Collection Time: 04/19/19  3:28 PM  Result Value Ref Range   Lactic Acid, Venous 1.8 0.5 - 1.9 mmol/L    Comment: Performed at [facility] [address], [place], [place] [address]  Procalcitonin     Status: None   Collection Time: 04/19/19  3:28 PM  Result Value Ref Range   Procalcitonin <0.10 ng/mL    Comment:        Interpretation: PCT (Procalcitonin) <= 0.5 ng/mL: Systemic infection (sepsis) is not likely. Local bacterial infection is possible. (NOTE)  Sepsis PCT Algorithm            Lower Respiratory Tract                                      Infection PCT Algorithm    ----------------------------     ----------------------------         PCT < 0.25 ng/mL                PCT < 0.10 ng/mL         Strongly encourage             Strongly discourage   discontinuation of antibiotics    initiation of antibiotics    ----------------------------     -----------------------------       PCT 0.25 - 0.50 ng/mL            PCT 0.10 - 0.25 ng/mL               OR       >80% decrease in PCT            Discourage initiation of                                            antibiotics      Encourage discontinuation           of antibiotics    ----------------------------     -----------------------------         PCT >= 0.50 ng/mL              PCT 0.26 - 0.50 ng/mL               AND        <80% decrease in PCT             Encourage initiation of                                             antibiotics       Encourage continuation           of antibiotics    ----------------------------     -----------------------------        PCT >= 0.50 ng/mL                  PCT > 0.50 ng/mL               AND         increase in PCT                  Strongly encourage                                      initiation of antibiotics    Strongly encourage escalation           of antibiotics                                     -----------------------------  PCT <= 0.25 ng/mL                                                 OR                                        > 80% decrease in PCT                                     Discontinue / Do not initiate                                             antibiotics Performed at [facility], [place] [address], [place], [place] [address]   C-reactive protein     Status: Abnormal   Collection Time: 04/19/19  4:07 PM  Result Value Ref Range   CRP 13.5 (H) <1.0 mg/dL    Comment: Performed at [facility],  [place] [address], [place], [place] [address]  Sedimentation rate     Status: Abnormal   Collection Time: 04/19/19  5:35 PM  Result Value Ref Range   Sed Rate 39 (H) 0 - 16 mm/hr    Comment: Performed at [facility] [address], [place], [place] [address]  CBG monitoring, ED     Status: Abnormal   Collection Time: 04/19/19  5:38 PM  Result Value Ref Range   Glucose-Capillary 173 (H) 70 - 99 mg/dL   Dg Chest 2 View  Result Date: 04/19/2019 CLINICAL DATA:  Fever.  Weakness.  Dizziness. EXAM: CHEST - 2 VIEW COMPARISON:  CT 04/01/2019.  Chest x-ray 04/01/2019. FINDINGS: Prominence of the anterior mediastinum is stable and consistent with prominent mediastinal fat as noted on prior CT. Hilar structures normal. Stable bilateral mild interstitial prominence consistent chronic interstitial lung disease again noted. Lungs are clear of acute infiltrates r. No pleural effusion or pneumothorax. Cardiomegaly with normal pulmonary vascularity. Degenerative changes thoracic spine. IMPRESSION: No acute cardiopulmonary disease. Electronically Signed   By: Marcello Moores  [place]   On: 04/19/2019 13:09   Ct Chest High Resolution  Result Date: 04/19/2019 CLINICAL DATA:  83 year old male with history of fever of unknown origin. Evaluate for interstitial lung disease. EXAM: CT CHEST WITHOUT CONTRAST TECHNIQUE: Multidetector CT imaging of the chest was performed following the standard protocol without intravenous contrast. High resolution imaging of the lungs, as well as inspiratory and expiratory imaging, was performed. COMPARISON:  Chest CT 04/01/2019. FINDINGS: Cardiovascular: Heart size is borderline enlarged. There is no significant pericardial fluid, thickening or pericardial calcification. There is aortic atherosclerosis, as well as atherosclerosis of the great vessels of the mediastinum and the coronary arteries, including calcified atherosclerotic plaque in the left main, left anterior descending, left circumflex  and right coronary arteries. Dilatation of the pulmonic trunk (3.9 cm in diameter). Mediastinum/Nodes: No pathologically enlarged mediastinal or hilar lymph nodes. Please note that accurate exclusion of hilar adenopathy is limited on noncontrast CT scans. Esophagus is unremarkable in appearance. No axillary lymphadenopathy. Lungs/Pleura: High-resolution images demonstrate patchy peripheral predominant areas of  ground-glass attenuation and septal thickening with some subpleural reticulation and areas of mild cylindrical traction bronchiectasis and peripheral bronchiolectasis. These findings have a definitive craniocaudal gradient. There may be some areas of developing honeycombing in the extreme lung bases, however, this is not yet definitive at this time. Inspiratory and expiratory imaging is unremarkable. No acute consolidative airspace disease. No pleural effusions. Upper Abdomen: Aortic atherosclerosis. Musculoskeletal: There are no aggressive appearing lytic or blastic lesions noted in the visualized portions of the skeleton. IMPRESSION: 1. The appearance of the lungs is compatible with interstitial lung disease, with a spectrum of findings considered probable usual interstitial pneumonia (UIP) per current ATS guidelines. 2. Dilatation of the pulmonic trunk (3.9 cm in diameter), concerning for associated pulmonary arterial hypertension. 3. Aortic atherosclerosis, in addition to left main and 3 vessel coronary artery disease. Please note that although the presence of coronary artery calcium documents the presence of coronary artery disease, the severity of this disease and any potential stenosis cannot be assessed on this non-gated CT examination. Assessment for potential risk factor modification, dietary therapy or pharmacologic therapy may be warranted, if clinically indicated. Aortic Atherosclerosis (ICD10-I70.0). Electronically Signed   By: Vinnie Langton M.D.   On: 04/19/2019 16:56    Pending  Labs Unresulted Labs (From admission, onward)    Start     Ordered   04/20/19 [id number]  Basic metabolic panel  Tomorrow morning,   R     04/19/19 1524   04/20/19 0500  CBC  Tomorrow morning,   R     04/19/19 1524   04/19/19 1446  SARS CORONAVIRUS 2 (TAT 6-12 HRS) Nasal Swab Aptima Multi Swab  (Asymptomatic/Tier 2 Patients Labs)  Once,   STAT    Question Answer Comment  Is this test for diagnosis or screening Screening   Symptomatic for COVID-19 as defined by CDC No   Hospitalized for COVID-19 No   Admitted to ICU for COVID-19 No   Previously tested for COVID-19 Yes   Resident in a congregate (group) care setting No   Employed in healthcare setting No      04/19/19 1448   04/19/19 1242  Culture, blood (routine x 2)  BLOOD CULTURE X 2,   STAT     04/19/19 1241   04/19/19 1242  Urine culture  ONCE - STAT,   STAT     04/19/19 1241          Vitals/Pain Today's Vitals   04/19/19 1745 04/19/19 1845 04/19/19 1900 04/19/19 1906  BP: (!) 135/54  (!) 105/54   Pulse: (!) 54 (!) 56 (!) 57   Resp: (!) 23 20 (!) 22   Temp:      TempSrc:      SpO2: 98% 99% 97%   Weight:      Height:      PainSc:    0-No pain    Isolation Precautions No active isolations  Medications Medications  vancomycin (VANCOCIN) IVPB 1000 mg/200 mL premix (has no administration in time range)  ceFEPIme (MAXIPIME) 2 g in sodium chloride 0.9 % 100 mL IVPB (has no administration in time range)  aspirin EC tablet 162 mg (has no administration in time range)  traZODone (DESYREL) tablet 75 mg (has no administration in time range)  pantoprazole (PROTONIX) EC tablet 40 mg (has no administration in time range)  enoxaparin (LOVENOX) injection 40 mg (has no administration in time range)  sodium chloride flush (NS) 0.9 % injection 3 mL (3 mLs Intravenous Given 04/19/19  1707)  acetaminophen (TYLENOL) tablet 650 mg (has no administration in time range)    Or  acetaminophen (TYLENOL) suppository 650 mg (has no administration  in time range)  docusate sodium (COLACE) capsule 100 mg (has no administration in time range)  polyethylene glycol (MIRALAX / GLYCOLAX) packet 17 g (has no administration in time range)  ondansetron (ZOFRAN) tablet 4 mg (has no administration in time range)    Or  ondansetron (ZOFRAN) injection 4 mg (has no administration in time range)  insulin aspart (novoLOG) injection 0-9 Units (2 Units Subcutaneous Given 04/19/19 1747)  insulin aspart (novoLOG) injection 0-5 Units (has no administration in time range)  0.9 %  sodium chloride infusion ( Intravenous New Bag/Given 04/19/19 1748)  hydrocortisone sodium succinate (SOLU-CORTEF) 100 MG injection 50 mg (50 mg Intravenous Given 04/19/19 1703)  multivitamin (PROSIGHT) tablet 1 tablet (has no administration in time range)  sodium chloride 0.9 % bolus 1,000 mL (0 mLs Intravenous Stopped 04/19/19 1723)  ceFEPIme (MAXIPIME) 2 g in sodium chloride 0.9 % 100 mL IVPB (0 g Intravenous Stopped 04/19/19 1526)  metroNIDAZOLE (FLAGYL) IVPB 500 mg (0 mg Intravenous Stopped 04/19/19 1646)  vancomycin (VANCOCIN) 1,500 mg in sodium chloride 0.9 % 500 mL IVPB (0 mg Intravenous Stopped 04/19/19 1743)  sodium chloride 0.9 % bolus 1,000 mL (0 mLs Intravenous Stopped 04/19/19 1646)    And  sodium chloride 0.9 % bolus 500 mL (0 mLs Intravenous Stopped 04/19/19 1646)    Mobility One assist Low fall risk   Focused Assessments AO x 4, having fever for the past few weeks, lung sound diminish bilateral, SR on the monitor 3 COVID test negative, pt under investigation from infection control to find out source of infection.    R Recommendations: See Admitting Provider Note  Report given to:   Additional Notes:

## 2019-04-19 NOTE — ED Notes (Signed)
CBG:[id number] 

## 2019-04-19 NOTE — H&P (Signed)
History and Physical    Kerry Matthews QJF:[id number] DOB: Jul 20, 1936 DOA: 04/19/2019  PCP: [name], MD Consultants:  [facility] - cardiology; [facility] - pulmonology; rheumatology appt Thursday; Lyndel Safe - GI Patient coming from:  Home - lives with wife; NOK: wife, [phone]; [phone]  Chief Complaint:  Fever/SOB  HPI: Kerry Matthews is a 83 y.o. male with medical history significant of CAD; HTN; DM; and recent hospitalization with new diagnosis of ILD with abnormal ANCA/elevated RF at that time presenting with fever/SOB.  He reports that he was getting along pretty good until this morning "then I just fell apart."  He was dizzy, weak, sore all over.  Symptoms started a little before breakfast today.  Family thinks maybe it was happening slowly over the last few days with progressive LE edema.  Fever today was to 101.7 but has been <100 prior.  Some SOB but relatively controlled.  Slight cough for the last few years, unchanged.  Mild stable headache.  He started with generalized weakness in mid-July with nighttime fever daily for 4 weeks.  He has been having difficulty sleeping for years.  He was hospitalized on [facility] service from 8/7-12.  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  He had multiple negative tests for COVID-19 infection.  He was treated empirically with Azithromycin and Levaquin prior to admission and IV antibiotics during the hospitalization.  He was started on SoluMedrol and had significant improvement; he was discharged home on prednisone.  Myeloperoxidase antibodies were elevated (>100), as were Rheumatoid Factor (95.4) and EBV (>600 IgG); p-ANCA was also + 1:160.  He subsequently followed up with Carl Best, NP, with pulmonology on 8/18.  He was scheduled for HR-CT in 2/21 and referred to rheumatology.    ED Course:   Fever, ?source.  Hospitalized with ?PNA, discharged on antibiotics.  Fevers since  July, none since he left the hospital.  This AM, fever of 101, BP 80/40.  Symptoms somewhat better, WBC 15 which is sort of stable.  Lactate 2.5.  ?source, nothing on exam.  ID consult pending.  Given broad spectrum antibiotics and will give full sepsis bolus.  4 COVIDs negative to date, but will test again.  Review of Systems: As per HPI; otherwise review of systems reviewed and negative.   Ambulatory Status:  Ambulates without assistance  Past Medical History:  Diagnosis Date  . Coronary artery disease   . Diabetes ([place])   . Hyperlipidemia   . Hypertension   . Osteoarthritis   . Renal stones   . Skin cancer     Past Surgical History:  Procedure Laterality Date  . BACK SURGERY    . CATARACT EXTRACTION    . COLONOSCOPY  06/16/2005   Mild colitis involving splenic flexure. Colonic polyps, status post polypectomy. Mild pancolonic diverticulitits. Internal hemorrhoids.   . ESOPHAGOGASTRODUODENOSCOPY  04/26/2003   Irregular Z line suggestive of GERD. Mild gastritis status post CLO testing.   Marland Kitchen TRIGGER FINGER RELEASE      Social History   Socioeconomic History  . Marital status: Married    Spouse name: Not on file  . Number of children: Not on file  . Years of education: Not on file  . Highest education level: Not on file  Occupational History  . Occupation: retired  Scientific laboratory technician  . Financial resource strain: Not on file  . Food insecurity    Worry: Not on file    Inability: Not on file  .  Transportation needs    Medical: Not on file    Non-medical: Not on file  Tobacco Use  . Smoking status: Never Smoker  . Smokeless tobacco: Never Used  Substance and Sexual Activity  . Alcohol use: Never    Frequency: Never  . Drug use: Never  . Sexual activity: Not on file  Lifestyle  . Physical activity    Days per week: Not on file    Minutes per session: Not on file  . Stress: Not on file  Relationships  . Social Herbalist on phone: Not on file    Gets  together: Not on file    Attends religious service: Not on file    Active member of club or organization: Not on file    Attends meetings of clubs or organizations: Not on file    Relationship status: Not on file  . Intimate partner violence    Fear of current or ex partner: Not on file    Emotionally abused: Not on file    Physically abused: Not on file    Forced sexual activity: Not on file  Other Topics Concern  . Not on file  Social History Narrative  . Not on file    Allergies  Allergen Reactions  . Ace Inhibitors Other (See Comments)    Slow heart rate  . Hydrocodone Nausea And Vomiting  . Hydrocodone-Acetaminophen Nausea And Vomiting  . Sulfamethoxazole-Trimethoprim     Got sicker    Family History  Problem Relation Age of Onset  . Tuberculosis Mother   . Stroke Father   . Pancreatic cancer Sister   . Heart attack Sister   . Lung disease Sister   . Clotting disorder Brother     Prior to Admission medications   Medication Sig Start Date End Date Taking? Authorizing Provider  aspirin 81 MG EC tablet Take 162 mg by mouth daily.    Yes [provider]  Coenzyme Q10 (CO Q 10) 10 MG CAPS Take 1 capsule by mouth daily.   Yes [provider]  furosemide (LASIX) 40 MG tablet Take 20 mg by mouth daily.    Yes [provider]  glipiZIDE (GLUCOTROL) 5 MG tablet Take 1 tablet (5 mg total) by mouth 2 (two) times daily. 04/06/19 05/06/19 Yes Vann, Jessica U, DO  losartan (COZAAR) 50 MG tablet Take 1 tablet by mouth daily.  12/06/18  Yes [provider]  metFORMIN (GLUCOPHAGE) 500 MG tablet Take 1 tablet (500 mg total) by mouth 2 (two) times daily with a meal. Patient taking differently: Take 500 mg by mouth 3 (three) times daily.  04/06/19  Yes Geradine Girt, DO  Multiple Vitamins-Minerals (PRESERVISION AREDS 2+MULTI VIT) CAPS Take 1 capsule by mouth 2 (two) times daily.   Yes [provider]  nitroGLYCERIN (NITROSTAT) 0.4 MG SL tablet  Place 0.4 mg under the tongue every 5 (five) minutes as needed.   Yes [provider]  Omega-3 Fatty Acids (FISH OIL) 1000 MG CAPS Take 1,000 mg by mouth 2 (two) times daily.    Yes [provider]  omeprazole (PRILOSEC) 20 MG capsule Take 20 mg by mouth daily.   Yes [provider]  predniSONE (DELTASONE) 10 MG tablet 30 mg PO x 5 days then 20 mg PO until after rheumatology and/or pulmonology follow up Patient taking differently: Take 20 mg by mouth daily. 20 mg PO until after rheumatology and/or pulmonology follow up 04/06/19  Yes Eulogio Bear  U, DO  traZODone (DESYREL) 50 MG tablet Take 75 mg by mouth at bedtime.    Yes [provider]  vitamin B-12 (CYANOCOBALAMIN) 1000 MCG tablet Take 1 tablet (1,000 mcg total) by mouth daily. 04/06/19  Yes Geradine Girt, DO    Physical Exam: Vitals:   04/19/19 1445 04/19/19 1500 04/19/19 1515 04/19/19 1530  BP: (!) 107/54 (!) 113/56 120/65 (!) 121/51  Pulse: (!) 56 (!) 58 (!) 57 (!) 58  Resp: 16 19 (!) 23 16  Temp:      TempSrc:      SpO2: 97% 98% 98% 97%  Weight:      Height:         . General:  Appears calm and comfortable and is NAD . Eyes:  PERRL, EOMI, normal lids, iris . ENT:  grossly normal hearing, lips & tongue, mmm; appropriate dentition . Neck:  no LAD, masses or thyromegaly; no carotid bruits . Cardiovascular:  RRR, no m/r/g. No LE edema.  Marland Kitchen Respiratory:   CTA bilaterally with no wheezes/rales/rhonchi.  Normal respiratory effort. . Abdomen:  soft, NT, ND, NABS . Back:   normal alignment, no CVAT . Skin:  no rash or induration seen on limited exam . Musculoskeletal:  grossly normal tone BUE/BLE, good ROM, no bony abnormality . Lower extremity:  No LE edema.  Limited foot exam with no ulcerations.  2+ distal pulses. Marland Kitchen Psychiatric:  grossly normal mood and affect, speech fluent and appropriate, AOx3 . Neurologic:  CN 2-12 grossly intact, moves all extremities in coordinated fashion, sensation  intact    Radiological Exams on Admission: Dg Chest 2 View  Result Date: 04/19/2019 CLINICAL DATA:  Fever.  Weakness.  Dizziness. EXAM: CHEST - 2 VIEW COMPARISON:  CT 04/01/2019.  Chest x-ray 04/01/2019. FINDINGS: Prominence of the anterior mediastinum is stable and consistent with prominent mediastinal fat as noted on prior CT. Hilar structures normal. Stable bilateral mild interstitial prominence consistent chronic interstitial lung disease again noted. Lungs are clear of acute infiltrates r. No pleural effusion or pneumothorax. Cardiomegaly with normal pulmonary vascularity. Degenerative changes thoracic spine. IMPRESSION: No acute cardiopulmonary disease. Electronically Signed   By: Marcello Moores  Register   On: 04/19/2019 13:09    EKG: Independently reviewed.  NSR with rate 56; no evidence of acute ischemia   Labs on Admission: I have personally reviewed the available labs and imaging studies at the time of the admission.  Pertinent labs:   Na++ 129; 133 on 8/12 CO2 21; 26 on 8/12 Glucose 299 BUN 26/Creatinine 1.43/GFR 46; 34/1.13/>60 on 8/12 Albumin 2.4; 2.1 on 8/12 LFTs abnormal on 8/12 but now normalized Lactate 2.6 WBC 15.8; 16.6 on 8/12 Hgb 10.5 UA: WNL EBV IgG >600, IgM <36 on 8/8 RVP negative 8/8 Myeloperoxidase Ab >100 on 8/8 P-ANCA 1:160 on 8/8 RA 85.4 on 8/9 ESR 42 on 8/7 COVID pending; previously negative on 8/7  Assessment/Plan Principal Problem:   Sepsis ([place]) Active Problems:   Hypertension   Hyperlipidemia   Diabetes mellitus type 2 in obese (HCC)   Abnormal CT of the chest   AKI (acute kidney injury) ([place])   Elevated rheumatoid factor   Interstitial pulmonary disease (HCC)   Abnormal ANCA test   Sepsis - ?infectious vs. Rheumatologic source -Patient with prior similar hospitalization earlier this month, with concern for sepsis at that time; at the time of d/c, he appeared to have interstitial pneumonitis/polymyositis as the source for his sepsis  physiology -He returned today with recurrent sepsis physiology -  SIRS criteria in this patient includes: Leukocytosis, fever (reported at home), tachypnea  -Patient has evidence of acute organ failure with elevated lactate and hypotension -While awaiting blood cultures, this appears to be a preseptic condition. -Sepsis protocol initiated -Patient had MAP <65 and so has received the 30 cc/kg IVF bolus. -Suspected source is unknown but again suspected to be rheumatologic in nature; during prior admission, he was noted to have elevated RF, abnormal CT suggestive of ILD, elevated EBV, elevated myeloperoxidase antibodies, and + p-ANCA -Blood and urine cultures pending -Will admit to Progressive Care for now and continue to monitor -Will admit due to: hemodynamic instability; failure of outpatient treatment -Treat with IV Cefepime/Vanc for undifferentiated sepsis -Will add stress dose steroids (hydrocortisone 50 mg IV q6h) for now given symptoms despite prednisone 20 mg daily at home -Will trend lactate to ensure improvement -Will order lower respiratory tract procalcitonin level.  Antibiotics would not be indicated for PCT <0.1 and probably should not be used for < 0.25.  >0.5 indicates infection and >>0.5 indicates more serious disease.  As the procalcitonin level normalizes, it will be reasonable to consider de-escalation of antibiotic coverage. -ID has been consulted -Will add high-resolution CT for further pulmonary visualization; consider pulmonology consultation based on results.  AKI -Baseline creatinine appears to be 1.1-1.2 with GFR about 60 -Current creatinine is 1.43 with GFR 46 - slightly worse than baseline -He has been given the 30 cc/g sepsis bolus and will also be continued on IVF for now -Hold diuretics and ARB as well as Glucophage  HTN -Hold Cozaar, as above -Hypotensive with EMS and in the ER so will not give HTN medications at this time  HLD -Hold fish oil due to low  utility in the inpatient setting  DM -Recent A1c was 6.4 -hold Glucophage and Glucotrol -Cover with sensitive-scale SSI with qhs coverage (given steroids)    Note: This patient has been tested and is pending for the novel coronavirus COVID-19.   DVT prophylaxis:  Lovenox Code Status:  DNR - confirmed with patient/family Family Communication: Granddaughter (new OR nurse at [facility]) present throughout evaluation  Disposition Plan:  Home once clinically improved Consults called: ID; consider pulmonology Admission status: Admit - It is my clinical opinion that admission to INPATIENT is reasonable and necessary because of the expectation that this patient will require hospital care that crosses at least 2 midnights to treat this condition based on the medical complexity of the problems presented.  Given the aforementioned information, the predictability of an adverse outcome is felt to be significant.    Karmen Bongo MD [facility]   How to contact the [facility] Attending or Consulting provider [place] or covering provider during after hours [place], for this patient?  1. Check the care team in [facility] and look for a) attending/consulting [facility] provider listed and b) the [facility] team listed 2. Log into [email or web address] and use [place]'s universal password to access. If you do not have the password, please contact the hospital operator. 3. Locate the [facility] provider you are looking for under [facility] and page to a number that you can be directly reached. 4. If you still have difficulty reaching the provider, please page the [facility] (Director on Call) for the Hospitalists listed on amion for assistance.   04/19/2019, 3:35 PM

## 2019-04-19 NOTE — Consult Note (Signed)
Kerry Matthews for Infectious Disease    Date of Admission:  04/19/2019     Total days of antibiotics 0               Reason for Consult: Fever   Referring Provider: Lorin Matthews Primary Care Provider: [name], MD   Assessment/Plan:  Kerry Matthews is an 83 y/o male presenting with recurrent fevers and myalgias following previous hospitalization with findings of interstitual lung disease and abnormal auto-immune work up. On evaluation he has no signs/symptoms that are concerning for infection and has had no symptoms concerning for malignancy at present. Would favor his symptoms being likely related to auto-immune process and agree with further evaluation to rule out other causes of infection. Will continue vancomycin and cefepime for now. Await CT results and blood cultures.  1. Continue current vancomycin and cefepime. 2. Await results of CT scan and culture results.  3. May need additional rheumatological work up.  4. Agree with steroids per primary team.   Active Problems:   Sepsis ([place])    sodium chloride flush  3 mL Intravenous Once     HPI: Kerry Matthews is a 83 y.o. male with previous medical history of hypertension, diabetes, and coronary artery disease presenting to the ED with the chief complaints of weakness and fever.  [name] was recently admitted to the hospital from 04/01/2019 until 04/06/2019 because of ongoing fever, chills, myalgias and nonproductive cough.  Work-up with questionable etiology of fever and believed to be secondary to pneumonitis with concern for polymyositis.  Prior to that he been having fevers for approximately 1 month.  Coronavirus testing was negative.  He was placed on broad-spectrum therapy with azithromycin and levofloxacin for presumed pneumonia with no significant improvements.  Antimicrobial therapy was stopped and he improved significantly after being started on IV Solu-Medrol.  Previous autoimmune work-up with myeloperoxidase  antibodies elevated at greater than 100, P-ANCA was also +1: 160.  RA factor elevated at 94.5.  He was transitioned to oral prednisone with follow-up with rheumatology and pulmonology.  [name] has had no fever since leaving the hospital until this morning when he had a temperature of 101.7 with associated myalgias and weakness.  Family note he has been feeling poorly over the last couple of days but with no specific symptoms.  Chest x-ray today with chronic interstitial lung disease and appearing unchanged from previous with no acute infiltrates or active cardiopulmonary disease.  And having nighttime fevers daily for 4 weeks starting in July with generalized weakness.  Found to be afebrile and hypotensive.  Lactic acid of 2.6, sodium 129, white blood cell count of 15.8, and creatinine of 1.43.  Per ED notes, ED physician felt need for antibiotics given some mean arterial pressures under 65 and lactate level and started on vancomycin and cefepime.Marland Kitchen  Hospitalist has ordered a high resolution CT scan of the chest.  Blood cultures have been collected and are in process.  [name] continues to take his previously prescribed prednisone now down to 20 mg daily. He had been feeling better since leaving the hospital until about 2-3 days ago when he started not feeling well including worsening myalgias. Today he developed a fever and has returned to the ED for further evaluation. No current respiratory or urinary symptoms. Has noted increased edema in his bilateral lower extremities which is new for him. He does not have any cats or chickens at home. Has not recently consumed any undercooked or raw meats  that he is aware of. No recent travels. Family did mention that he may have had a tick bite back in June.    Review of Systems: Review of Systems  Constitutional: Positive for fever and malaise/fatigue. Negative for chills and weight loss.  Respiratory: Negative for cough, shortness of breath and wheezing.    Cardiovascular: Positive for leg swelling. Negative for chest pain.  Gastrointestinal: Negative for abdominal pain, constipation, diarrhea, nausea and vomiting.  Musculoskeletal: Positive for myalgias.  Skin: Negative for rash.  Endo/Heme/Allergies: Bruises/bleeds easily:       Past Medical History:  Diagnosis Date   Coronary artery disease    Diabetes ([place])    Hyperlipidemia    Hypertension    Osteoarthritis    Renal stones    Skin cancer     Social History   Tobacco Use   Smoking status: Never Smoker   Smokeless tobacco: Never Used  Substance Use Topics   Alcohol use: Never    Frequency: Never   Drug use: Never    Family History  Problem Relation Age of Onset   Tuberculosis Mother    Stroke Father    Pancreatic cancer Sister    Heart attack Sister    Lung disease Sister    Clotting disorder Brother     Allergies  Allergen Reactions   Ace Inhibitors Other (See Comments)    Slow heart rate   Hydrocodone Nausea And Vomiting   Hydrocodone-Acetaminophen Nausea And Vomiting   Sulfamethoxazole-Trimethoprim     Got sicker    OBJECTIVE: Blood pressure (!) 113/56, pulse (!) 58, temperature 98.4 F (36.9 C), temperature source Oral, resp. rate 19, height 5\' 8"  (1.727 m), weight 81.6 kg, SpO2 98 %.  Physical Exam Constitutional:      General: He is not in acute distress.    Appearance: He is well-developed. He is not ill-appearing.     Comments: Lying in bed with head of bed elevated; pleasant.   Cardiovascular:     Rate and Rhythm: Normal rate and regular rhythm.     Heart sounds: Normal heart sounds. No murmur.     Comments: Bilateral lower extremity edema with 1+ pitting at some locations.  Pulmonary:     Effort: Pulmonary effort is normal.     Breath sounds: Normal breath sounds. No stridor. No wheezing, rhonchi or rales.  Abdominal:     General: Bowel sounds are normal.     Palpations: Abdomen is soft.     Tenderness: There is  no abdominal tenderness. There is no guarding or rebound.  Musculoskeletal:     Comments: Upper and lower extremity muscle strength all intact and appropriate.   Skin:    General: Skin is warm and dry.  Neurological:     Mental Status: He is alert and oriented to person, place, and time.  Psychiatric:        Mood and Affect: Mood normal.        Behavior: Behavior normal.        Thought Content: Thought content normal.        Judgment: Judgment normal.     Lab Results Lab Results  Component Value Date   WBC 15.8 (H) 04/19/2019   HGB 10.5 (L) 04/19/2019   HCT 33.0 (L) 04/19/2019   MCV 90.2 04/19/2019   PLT 312 04/19/2019    Lab Results  Component Value Date   CREATININE 1.43 (H) 04/19/2019   BUN 26 (H) 04/19/2019   NA 129 (L) 04/19/2019  K 4.3 04/19/2019   CL 97 (L) 04/19/2019   CO2 21 (L) 04/19/2019    Lab Results  Component Value Date   ALT 28 04/19/2019   AST 18 04/19/2019   ALKPHOS 93 04/19/2019   BILITOT 0.9 04/19/2019     Microbiology: No results found for this or any previous visit (from the past 240 hour(s)).   Terri Piedra, NP [facility] [phone] Pager  04/19/2019  3:16 PM

## 2019-04-20 DIAGNOSIS — M359 Systemic involvement of connective tissue, unspecified: Secondary | ICD-10-CM

## 2019-04-20 DIAGNOSIS — Z881 Allergy status to other antibiotic agents status: Secondary | ICD-10-CM

## 2019-04-20 LAB — CBC
HCT: 28.7 % — ABNORMAL LOW (ref 39.0–52.0)
Hemoglobin: 9.4 g/dL — ABNORMAL LOW (ref 13.0–17.0)
MCH: 29.2 pg (ref 26.0–34.0)
MCHC: 32.8 g/dL (ref 30.0–36.0)
MCV: 89.1 fL (ref 80.0–100.0)
Platelets: 228 10*3/uL (ref 150–400)
RBC: 3.22 MIL/uL — ABNORMAL LOW (ref 4.22–5.81)
RDW: 14.2 % (ref 11.5–15.5)
WBC: 11.5 10*3/uL — ABNORMAL HIGH (ref 4.0–10.5)
nRBC: 0 % (ref 0.0–0.2)

## 2019-04-20 LAB — URINE CULTURE: Culture: <10000 — AB

## 2019-04-20 LAB — BASIC METABOLIC PANEL
Anion gap: 8 (ref 5–15)
BUN: 25 mg/dL — ABNORMAL HIGH (ref 8–23)
CO2: 20 mmol/L — ABNORMAL LOW (ref 22–32)
Calcium: 7.5 mg/dL — ABNORMAL LOW (ref 8.9–10.3)
Chloride: 107 mmol/L (ref 98–111)
Creatinine, Ser: 1.03 mg/dL (ref 0.61–1.24)
GFR calc Af Amer: >60 mL/min (ref >60)
GFR calc non Af Amer: >60 mL/min (ref >60)
Glucose, Bld: 190 mg/dL — ABNORMAL HIGH (ref 70–99)
Potassium: 4.4 mmol/L (ref 3.5–5.1)
Sodium: 135 mmol/L (ref 135–145)

## 2019-04-20 LAB — GLUCOSE, CAPILLARY
Glucose-Capillary: 184 mg/dL — ABNORMAL HIGH (ref 70–99)
Glucose-Capillary: 303 mg/dL — ABNORMAL HIGH (ref 70–99)
Glucose-Capillary: 296 mg/dL — ABNORMAL HIGH (ref 70–99)
Glucose-Capillary: 286 mg/dL — ABNORMAL HIGH (ref 70–99)

## 2019-04-20 LAB — MRSA PCR SCREENING: MRSA by PCR: NEGATIVE

## 2019-04-20 MED ORDER — PREDNISONE 20 MG PO TABS
60.0000 mg | ORAL_TABLET | Freq: Every day | ORAL | Status: DC
  Administered 2019-04-21: 60 mg via ORAL
  Filled 2019-04-20: qty 3

## 2019-04-20 MED ORDER — INSULIN GLARGINE 100 UNIT/ML ~~LOC~~ SOLN
7.0000 [IU] | Freq: Every day | SUBCUTANEOUS | Status: DC
  Administered 2019-04-20 – 2019-04-21 (×2): 7 [IU] via SUBCUTANEOUS
  Filled 2019-04-20 (×2): qty 0.07

## 2019-04-20 NOTE — ED Notes (Signed)
Family updated about admission room order and visitors policy. Wife verbalized understanding.

## 2019-04-20 NOTE — Progress Notes (Signed)
[facility] for Infectious Disease  Date of Admission:  04/19/2019     Total days of antibiotics 1         ASSESSMENT/PLAN  [name] is significantly improved today following initiation of corticosteroids indicating his symptoms are likely related to autoimmune process and interstitial lung disease as he has no current signs/symptoms of bacterial, fungal, or viral infection.  Blood cultures and urine culture negative.  We discussed the nature of autoimmune disease and use of corticosteroids.  There appears to be no convincing evidence to continue antibiotics and will discontinue vancomycin and cefepime.  Recommend continuing to monitor off antibiotics.  Will need follow-up with rheumatology/pulmonology.  1.  Discontinue cefepime and vancomycin. 2.  Continue to monitor cultures and fever curve. 3.  Recommend continued follow-up with rheumatology and pulmonology at discharge.   Principal Problem:   Fever Active Problems:   Hypertension   Hyperlipidemia   Diabetes mellitus type 2 in obese (HCC)   Abnormal CT of the chest   AKI (acute kidney injury) (HCC)   Elevated rheumatoid factor   Interstitial pulmonary disease (HCC)   Abnormal ANCA test   Sepsis (HCC)   Myalgia   . aspirin EC  162 mg Oral Daily  . docusate sodium  100 mg Oral BID  . enoxaparin (LOVENOX) injection  40 mg Subcutaneous Q24H  . hydrocortisone sod succinate (SOLU-CORTEF) inj  50 mg Intravenous Q6H  . insulin aspart  0-5 Units Subcutaneous QHS  . insulin aspart  0-9 Units Subcutaneous TID WC  . multivitamin  1 tablet Oral Daily  . pantoprazole  40 mg Oral Daily  . sodium chloride flush  3 mL Intravenous Q12H  . traZODone  75 mg Oral QHS    SUBJECTIVE:  Afebrile overnight with no acute events.  Feeling significantly better today and back to baseline with no concerns/complaints.  Has several questions regarding autoimmune disease and need for continued use of prednisone.  Allergies  Allergen  Reactions  . Ace Inhibitors Other (See Comments)    Slow heart rate  . Hydrocodone Nausea And Vomiting  . Hydrocodone-Acetaminophen Nausea And Vomiting  . Sulfamethoxazole-Trimethoprim     Got sicker     Review of Systems: Review of Systems  Constitutional: Negative for chills, fever and weight loss.  Respiratory: Negative for cough, shortness of breath and wheezing.   Cardiovascular: Negative for chest pain and leg swelling.  Gastrointestinal: Negative for abdominal pain, constipation, diarrhea, nausea and vomiting.  Skin: Negative for rash.      OBJECTIVE: Vitals:   04/20/19 0034 04/20/19 0327 04/20/19 0724 04/20/19 1121  BP: (!) 165/88 125/65 124/68 (!) 125/57  Pulse: 69 (!) 57 (!) 57 (!) 52  Resp: 17 18 19  (!) 22  Temp: 97.7 F (36.5 C) (!) 97.5 F (36.4 C) (!) 97.5 F (36.4 C) (!) 97.5 F (36.4 C)  TempSrc: Oral Oral Oral Oral  SpO2: 96% 98%    Weight: 85.5 kg     Height: 5\' 8"  (1.727 m)      Body mass index is 28.66 kg/m.  Physical Exam Constitutional:      General: He is not in acute distress.    Appearance: He is well-developed.  Cardiovascular:     Rate and Rhythm: Normal rate and regular rhythm.     Heart sounds: Normal heart sounds.  Pulmonary:     Effort: Pulmonary effort is normal.     Breath sounds: Normal breath sounds.  Skin:    General:  Skin is warm and dry.  Neurological:     Mental Status: He is alert and oriented to person, place, and time.  Psychiatric:        Behavior: Behavior normal.        Thought Content: Thought content normal.        Judgment: Judgment normal.     Lab Results Lab Results  Component Value Date   WBC 11.5 (H) 04/20/2019   HGB 9.4 (L) 04/20/2019   HCT 28.7 (L) 04/20/2019   MCV 89.1 04/20/2019   PLT 228 04/20/2019    Lab Results  Component Value Date   CREATININE 1.03 04/20/2019   BUN 25 (H) 04/20/2019   NA 135 04/20/2019   K 4.4 04/20/2019   CL 107 04/20/2019   CO2 20 (L) 04/20/2019    Lab Results   Component Value Date   ALT 28 04/19/2019   AST 18 04/19/2019   ALKPHOS 93 04/19/2019   BILITOT 0.9 04/19/2019     Microbiology: Recent Results (from the past 240 hour(s))  Culture, blood (routine x 2)     Status: None (Preliminary result)   Collection Time: 04/19/19 12:42 PM   Specimen: BLOOD  Result Value Ref Range Status   Specimen Description BLOOD LEFT ANTECUBITAL  Final   Special Requests   Final    BOTTLES DRAWN AEROBIC AND ANAEROBIC Blood Culture adequate volume   Culture   Final    NO GROWTH < 24 HOURS Performed at [facility], [address] [place], [place] [address]    Report Status PENDING  Incomplete  Culture, blood (routine x 2)     Status: None (Preliminary result)   Collection Time: 04/19/19  1:25 PM   Specimen: BLOOD RIGHT HAND  Result Value Ref Range Status   Specimen Description BLOOD RIGHT HAND  Final   Special Requests   Final    BOTTLES DRAWN AEROBIC AND ANAEROBIC Blood Culture adequate volume   Culture   Final    NO GROWTH < 24 HOURS Performed at [facility], [place] [address], [place], [place] [address]    Report Status PENDING  Incomplete  Urine culture     Status: Abnormal   Collection Time: 04/19/19  1:46 PM   Specimen: Urine, Random  Result Value Ref Range Status   Specimen Description URINE, RANDOM  Final   Special Requests NONE  Final   Culture (A)  Final    <10,000 COLONIES/mL INSIGNIFICANT GROWTH Performed at [facility], [place] [address] [place], [place] [address]    Report Status 04/20/2019 FINAL  Final  SARS CORONAVIRUS 2 (TAT 6-12 HRS) Nasal Swab Aptima Multi Swab     Status: None   Collection Time: 04/19/19  3:28 PM   Specimen: Aptima Multi Swab; Nasal Swab  Result Value Ref Range Status   SARS Coronavirus 2 NEGATIVE NEGATIVE Final    Comment: (NOTE) SARS-CoV-2 target nucleic acids are NOT DETECTED. The SARS-CoV-2 RNA is generally detectable in upper and lower respiratory specimens during the acute phase of  infection. Negative results do not preclude SARS-CoV-2 infection, do not rule out co-infections with other pathogens, and should not be used as the sole basis for treatment or other patient management decisions. Negative results must be combined with clinical observations, patient history, and epidemiological information. The expected result is Negative. Fact Sheet for Patients: SugarRoll.be Fact Sheet for Healthcare Providers: https://www.woods-mathews.com/ This test is not yet approved or cleared by the [place]  FDA and  has been authorized for detection and/or diagnosis of SARS-CoV-2 by FDA under an Emergency Use Authorization (EUA). This EUA will remain  in effect (meaning this test can be used) for the duration of the COVID-19 declaration under Section 56 4(b)(1) of the Act, 21 U.S.C. section 360bbb-3(b)(1), unless the authorization is terminated or revoked sooner. Performed at [facility], [place] [address], [place], [place] [address]   MRSA PCR Screening     Status: None   Collection Time: 04/20/19 12:48 AM   Specimen: Nasopharyngeal  Result Value Ref Range Status   MRSA by PCR NEGATIVE NEGATIVE Final    Comment:        The GeneXpert MRSA Assay (FDA approved for NASAL specimens only), is one component of a comprehensive MRSA colonization surveillance program. It is not intended to diagnose MRSA infection nor to guide or monitor treatment for MRSA infections. Performed at [facility], [place] [address] [place], [place] [address]      Terri Piedra, [place] for [facility] [phone] Pager  04/20/2019  1:52 PM

## 2019-04-20 NOTE — Progress Notes (Signed)
PROGRESS NOTE    Kerry Matthews  [id number] DOB: 10/06/1935 DOA: 04/19/2019 PCP: [name], MD   Brief Narrative: 83 year old with past medical history significant for coronary artery disease, hypertension, diabetes and recent hospitalization with new diagnosis of interstitial lung disease with abnormal ANCA/elevated rheumatoid factor at that time presenting with fever and shortness of breath.  Patient relate that he was getting better and the morning of admission he just fell apart.  He was feeling dizzy, weak, sore all over.  Patient was noted to have a temperature of 101.  He was also complaining of worsening shortness of breath, slight worsening cough. 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  He was treated empirically with azithromycin and Levaquin and IV antibiotics during last hospitalization.  He was a started on Solu-Medrol and has significant improvement and discharged home on prednisone.  Myeloperoxidase antibodies were elevated more than 100 and rheumatoid factor was 95.  Patient follow-up with pulmonologist on 8/18 and was scheduled for HR CT a and referred to rheumatology. Patient with fever, hypotension, leukocytosis.  COVID 19-    Assessment & Plan:   Principal Problem:   Fever Active Problems:   Hypertension   Hyperlipidemia   Diabetes mellitus type 2 in obese (HCC)   Abnormal CT of the chest   AKI (acute kidney injury) (HCC)   Elevated rheumatoid factor   Interstitial pulmonary disease (HCC)   Abnormal ANCA test   Sepsis ([place])   Myalgia   1-SIRS; infectious versus rheumatologic source Probably autoimmune process. Follow-up blood cultures. Appreciate ID recommendation plan to monitor off of antibiotics. Prior admission with elevated rheumatoid factor positive myeloperoxidase antibody p-ANCA. He was a started on a stress dose  hydrocortisone 50 mg every 6 hours. Receive IV fluids. He will require rheumatology follow-up as an outpatient.  2-interstitial lung disease/pneumonitis: CCM consulted. Prior rheumatoid factor positive and myeloperoxidase antibody p-ANCA positive  3-AKI: Creatinine baseline 1.1. Patient with a creatinine of 1.4 Received IV fluids.   4-Hypertension: Hold Cozaar due to AKI and hypotension.  5-Diabetes: Hold Glucophage and Glucotrol.  Sliding scale insulin. Will restart Lantus, low-dose  Hyponatremia: Improve   Estimated body mass index is 28.66 kg/m as calculated from the following:   Height as of this encounter: 5\' 8"  (1.727 m).   Weight as of this encounter: 85.5 kg.   DVT prophylaxis: Lovenox Code Status: DNR Family Communication: Contact wife Disposition Plan: Transfer to telemetry Consultants:   CCM  ID  Procedures:   None  Antimicrobials:  Vancomycin cefepime  Subjective: Patient is alert and oriented, he is feeling better.  Denies dyspnea  Objective: Vitals:   04/20/19 0034 04/20/19 0327 04/20/19 0724 04/20/19 1121  BP: (!) 165/88 125/65 124/68 (!) 125/57  Pulse: 69 (!) 57 (!) 57 (!) 52  Resp: 17 18 19  (!) 22  Temp: 97.7 F (36.5 C) (!) 97.5 F (36.4 C) (!) 97.5 F (36.4 C) (!) 97.5 F (36.4 C)  TempSrc: Oral Oral Oral Oral  SpO2: 96% 98%    Weight: 85.5 kg     Height: 5\' 8"  (1.727 m)       Intake/Output Summary (Last 24 hours) at 04/20/2019 1400 Last data filed at 04/20/2019 0336 Gross per 24 hour  Intake 1735.23 ml  Output 600 ml  Net 1135.23 ml   Filed Weights   04/19/19 1318 04/20/19 0034  Weight: 81.6 kg 85.5 kg    Examination:  General  exam: Appears calm and comfortable  Respiratory system: Crackles at the bases Cardiovascular system: S1 & S2 heard, RRR. No JVD, murmurs, rubs, gallops or clicks. No pedal edema. Gastrointestinal system: Abdomen is nondistended, soft and nontender. No organomegaly or masses felt. Normal bowel  sounds heard. Central nervous system: Alert and oriented. No focal neurological deficits. Extremities: Symmetric 5 x 5 power. Skin: No rashes, lesions or ulcers    Data Reviewed: I have personally reviewed following labs and imaging studies  CBC: Recent Labs  Lab 04/19/19 0940 04/20/19 0501  WBC 15.8* 11.5*  NEUTROABS 13.0*  --   HGB 10.5* 9.4*  HCT 33.0* 28.7*  MCV 90.2 89.1  PLT 312 [id number]   Basic Metabolic Panel: Recent Labs  Lab 04/19/19 0940 04/20/19 0501  NA 129* 135  K 4.3 4.4  CL 97* 107  CO2 21* 20*  GLUCOSE 299* 190*  BUN 26* 25*  CREATININE 1.43* 1.03  CALCIUM 7.9* 7.5*   GFR: Estimated Creatinine Clearance: 59.8 mL/min (by C-G formula based on SCr of 1.03 mg/dL). Liver Function Tests: Recent Labs  Lab 04/19/19 0940  AST 18  ALT 28  ALKPHOS 93  BILITOT 0.9  PROT 5.5*  ALBUMIN 2.4*   No results for input(s): LIPASE, AMYLASE in the last 168 hours. No results for input(s): AMMONIA in the last 168 hours. Coagulation Profile: No results for input(s): INR, PROTIME in the last 168 hours. Cardiac Enzymes: No results for input(s): CKTOTAL, CKMB, CKMBINDEX, TROPONINI in the last 168 hours. BNP (last 3 results) No results for input(s): PROBNP in the last 8760 hours. HbA1C: No results for input(s): HGBA1C in the last 72 hours. CBG: Recent Labs  Lab 04/19/19 1738 04/19/19 2154 04/20/19 0719 04/20/19 1116  GLUCAP 173* 239* 184* 303*   Lipid Profile: No results for input(s): CHOL, HDL, LDLCALC, TRIG, CHOLHDL, LDLDIRECT in the last 72 hours. Thyroid Function Tests: No results for input(s): TSH, T4TOTAL, FREET4, T3FREE, THYROIDAB in the last 72 hours. Anemia Panel: No results for input(s): VITAMINB12, FOLATE, FERRITIN, TIBC, IRON, RETICCTPCT in the last 72 hours. Sepsis Labs: Recent Labs  Lab 04/19/19 1330 04/19/19 1528  PROCALCITON  --  <0.10  LATICACIDVEN 2.6* 1.8    Recent Results (from the past 240 hour(s))  Culture, blood (routine x 2)      Status: None (Preliminary result)   Collection Time: 04/19/19 12:42 PM   Specimen: BLOOD  Result Value Ref Range Status   Specimen Description BLOOD LEFT ANTECUBITAL  Final   Special Requests   Final    BOTTLES DRAWN AEROBIC AND ANAEROBIC Blood Culture adequate volume   Culture   Final    NO GROWTH < 24 HOURS Performed at [facility], [place] [address] [place], [place] [address]    Report Status PENDING  Incomplete  Culture, blood (routine x 2)     Status: None (Preliminary result)   Collection Time: 04/19/19  1:25 PM   Specimen: BLOOD RIGHT HAND  Result Value Ref Range Status   Specimen Description BLOOD RIGHT HAND  Final   Special Requests   Final    BOTTLES DRAWN AEROBIC AND ANAEROBIC Blood Culture adequate volume   Culture   Final    NO GROWTH < 24 HOURS Performed at [facility], [place] [address], [place], [place] [address]    Report Status PENDING  Incomplete  Urine culture     Status: Abnormal   Collection Time: 04/19/19  1:46 PM   Specimen: Urine, Random  Result Value Ref Range Status   Specimen Description URINE, RANDOM  Final   Special Requests NONE  Final   Culture (A)  Final    <10,000 COLONIES/mL INSIGNIFICANT GROWTH Performed at [facility], [place] [address], [place], [place] [address]    Report Status 04/20/2019 FINAL  Final  SARS CORONAVIRUS 2 (TAT 6-12 HRS) Nasal Swab Aptima Multi Swab     Status: None   Collection Time: 04/19/19  3:28 PM   Specimen: Aptima Multi Swab; Nasal Swab  Result Value Ref Range Status   SARS Coronavirus 2 NEGATIVE NEGATIVE Final    Comment: (NOTE) SARS-CoV-2 target nucleic acids are NOT DETECTED. The SARS-CoV-2 RNA is generally detectable in upper and lower respiratory specimens during the acute phase of infection. Negative results do not preclude SARS-CoV-2 infection, do not rule out co-infections with other pathogens, and should not be used as the sole basis for treatment or other patient management  decisions. Negative results must be combined with clinical observations, patient history, and epidemiological information. The expected result is Negative. Fact Sheet for Patients: SugarRoll.be Fact Sheet for Healthcare Providers: https://www.woods-mathews.com/ This test is not yet approved or cleared by the [place] FDA and  has been authorized for detection and/or diagnosis of SARS-CoV-2 by FDA under an Emergency Use Authorization (EUA). This EUA will remain  in effect (meaning this test can be used) for the duration of the COVID-19 declaration under Section 56 4(b)(1) of the Act, 21 U.S.C. section 360bbb-3(b)(1), unless the authorization is terminated or revoked sooner. Performed at [facility], [place] [address] [place], [place] [address]   MRSA PCR Screening     Status: None   Collection Time: 04/20/19 12:48 AM   Specimen: Nasopharyngeal  Result Value Ref Range Status   MRSA by PCR NEGATIVE NEGATIVE Final    Comment:        The GeneXpert MRSA Assay (FDA approved for NASAL specimens only), is one component of a comprehensive MRSA colonization surveillance program. It is not intended to diagnose MRSA infection nor to guide or monitor treatment for MRSA infections. Performed at [facility], [place] [address], [place], [place] [address]          Radiology Studies: Dg Chest 2 View  Result Date: 04/19/2019 CLINICAL DATA:  Fever.  Weakness.  Dizziness. EXAM: CHEST - 2 VIEW COMPARISON:  CT 04/01/2019.  Chest x-ray 04/01/2019. FINDINGS: Prominence of the anterior mediastinum is stable and consistent with prominent mediastinal fat as noted on prior CT. Hilar structures normal. Stable bilateral mild interstitial prominence consistent chronic interstitial lung disease again noted. Lungs are clear of acute infiltrates r. No pleural effusion or pneumothorax. Cardiomegaly with normal pulmonary vascularity. Degenerative changes  thoracic spine. IMPRESSION: No acute cardiopulmonary disease. Electronically Signed   By: Marcello Moores  Register   On: 04/19/2019 13:09   Ct Chest High Resolution  Result Date: 04/19/2019 CLINICAL DATA:  83 year old male with history of fever of unknown origin. Evaluate for interstitial lung disease. EXAM: CT CHEST WITHOUT CONTRAST TECHNIQUE: Multidetector CT imaging of the chest was performed following the standard protocol without intravenous contrast. High resolution imaging of the lungs, as well as inspiratory and expiratory imaging, was performed. COMPARISON:  Chest CT 04/01/2019. FINDINGS: Cardiovascular: Heart size is borderline enlarged. There is no significant pericardial fluid, thickening or pericardial calcification. There is aortic atherosclerosis, as well as atherosclerosis of the great vessels of the mediastinum and the coronary arteries, including calcified atherosclerotic plaque in the left  main, left anterior descending, left circumflex and right coronary arteries. Dilatation of the pulmonic trunk (3.9 cm in diameter). Mediastinum/Nodes: No pathologically enlarged mediastinal or hilar lymph nodes. Please note that accurate exclusion of hilar adenopathy is limited on noncontrast CT scans. Esophagus is unremarkable in appearance. No axillary lymphadenopathy. Lungs/Pleura: High-resolution images demonstrate patchy peripheral predominant areas of ground-glass attenuation and septal thickening with some subpleural reticulation and areas of mild cylindrical traction bronchiectasis and peripheral bronchiolectasis. These findings have a definitive craniocaudal gradient. There may be some areas of developing honeycombing in the extreme lung bases, however, this is not yet definitive at this time. Inspiratory and expiratory imaging is unremarkable. No acute consolidative airspace disease. No pleural effusions. Upper Abdomen: Aortic atherosclerosis. Musculoskeletal: There are no aggressive appearing lytic or  blastic lesions noted in the visualized portions of the skeleton. IMPRESSION: 1. The appearance of the lungs is compatible with interstitial lung disease, with a spectrum of findings considered probable usual interstitial pneumonia (UIP) per current ATS guidelines. 2. Dilatation of the pulmonic trunk (3.9 cm in diameter), concerning for associated pulmonary arterial hypertension. 3. Aortic atherosclerosis, in addition to left main and 3 vessel coronary artery disease. Please note that although the presence of coronary artery calcium documents the presence of coronary artery disease, the severity of this disease and any potential stenosis cannot be assessed on this non-gated CT examination. Assessment for potential risk factor modification, dietary therapy or pharmacologic therapy may be warranted, if clinically indicated. Aortic Atherosclerosis (ICD10-I70.0). Electronically Signed   By: Vinnie Langton M.D.   On: 04/19/2019 16:56        Scheduled Meds:  aspirin EC  162 mg Oral Daily   docusate sodium  100 mg Oral BID   enoxaparin (LOVENOX) injection  40 mg Subcutaneous Q24H   hydrocortisone sod succinate (SOLU-CORTEF) inj  50 mg Intravenous Q6H   insulin aspart  0-5 Units Subcutaneous QHS   insulin aspart  0-9 Units Subcutaneous TID WC   multivitamin  1 tablet Oral Daily   pantoprazole  40 mg Oral Daily   sodium chloride flush  3 mL Intravenous Q12H   traZODone  75 mg Oral QHS   Continuous Infusions:   LOS: 1 day    Time spent: 35 minutes    Elmarie Shiley, MD [facility] Pager [phone]  If 7PM-7AM, please contact night-coverage [email or web address] Password [email or web address] 04/20/2019, 2:00 PM

## 2019-04-20 NOTE — Progress Notes (Signed)
Inpatient Diabetes Program Recommendations  AACE/ADA: New Consensus Statement on Inpatient Glycemic Control (2015)  Target Ranges:  Prepandial:   less than 140 mg/dL      Peak postprandial:   less than 180 mg/dL (1-2 hours)      Critically ill patients:  140 - 180 mg/dL   Lab Results  Component Value Date   GLUCAP 184 (H) 04/20/2019   HGBA1C 6.2 (H) 04/03/2019    Review of Glycemic Control Results for Kerry Matthews, Kerry Matthews (MRN [id number]) as of 04/20/2019 10:08  Ref. Range 04/06/2019 11:19 04/06/2019 16:15 04/19/2019 17:38 04/19/2019 21:54 04/20/2019 07:19  Glucose-Capillary Latest Ref Range: 70 - 99 mg/dL 226 (H) 225 (H) 173 (H) 239 (H) 184 (H)   Diabetes history: DM 2 Outpatient Diabetes medications:  Glucotrol 5 mg bid, Metformin 500 mg tid with meals Current orders for Inpatient glycemic control:  Novolog sensitive tid with meals and HS Solucortef 50 mg IV q 6 hours  Inpatient Diabetes Program Recommendations:   Please consider adding Levemir 6 units bid.   Thanks  Adah Perl, RN, BC-ADM Inpatient Diabetes Coordinator Pager [phone] (8a-5p)

## 2019-04-20 NOTE — Plan of Care (Signed)

## 2019-04-20 NOTE — Consult Note (Signed)
NAME:  Kerry Matthews, MRN:  [id number], DOB:  [birth year], LOS: 1 ADMISSION DATE:  04/19/2019, CONSULTATION DATE:  04/20/2019 REFERRING MD:  Dr. 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  PCCM consulted for further pulmonary recommendations.   History of present illness   83 year old male never smoker with history of CAD, DM, HTN and recent diagnosis of ILD in 03/2019 presenting from home with one day history of worsening muscle soreness, not joint, reemergence of fever 101.7, and mild dyspnea.  Reported feeling ill since mid-July for several weeks of mostly night fevers had stopped since most recent hospitalization.  Reports years of dry cough without precipitating factors.   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   04/02/2019-connective tissue work-up: Anti-Jo 1-negative Anti-DNA antibody double-stranded-negative Anti-scleroderma antibody-negative Sjogren's syndrome antibody-negative Sjogren's syndrome antibody-negative CK-31 CCP-6 EBV-greater than 600 MPO/PR-3  ANCA antibodies- myeloperoxidase ABS-greater than 100, ANCA proteinase 3-less than 3.5 ANCA titers- p-ANCA +1: 160, C ANCA-less than 1: 20, atypical p-ANCA titer-less than 1: 20 CT chest abnormal showing no acute process in chest abdomen or pelvis, interstitial lung disease suspicious for early or mild UIP, and pulmonary artery enlargement suggestive of PAH.  Echo 04/04/2019 showed LVEF 60-65% with mildly increased LV wall thickness, normal RV systolic function He was treated empirically with azithromycin and Levaquin but had significant improvement after being started on solumedrol and discharged  home on prednisone at 20 mg daily.  Since seen in our office on 8/18 with plans for HRCT and referral to rheumatology.  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  Patient was supposed to taper prednisone down from 20 to 10 mg daily but was hospitalized first.    Initial exam showing fever of 101, stable WBC at 15, hypotension with lactate 2.5 without clear source, since improved and empiric.  To date, has had four negative COVID test. CXR did not show any acute changes, stable bilateral mild interstitial prominences.  HRCT obtained 8/25 which shows findings consistent with UIP but also shows concern for pulmonary arterial hypertension given dilation of the pulmonic trunk.  Since admission, he has been afebrile and on room air.  PCCM consulted for further pulmonary recommendations.   Past Medical History  CAD, HTN, DM, ? GERD  Significant Hospital Events   8/25 Admit  Consults:  ID PCCM   Procedures:   Significant Diagnostic Tests:  PTA- 04/04/2019 TTE >> 1. The left ventricle has normal systolic function with an ejection fraction of 60-65%. The cavity size was normal. There is mildly increased left ventricular wall thickness. Left ventricular diastolic Doppler parameters are indeterminate. No evidence  of left ventricular regional wall motion abnormalities.  2. The right ventricle has normal systolic function. The cavity was normal. There is no increase in right ventricular wall thickness.  3. Left atrial size was mildly dilated.  4. No evidence of mitral valve stenosis. Mild mitral regurgitation.  5. The aortic valve is tricuspid. Aortic valve regurgitation is mild by color flow Doppler. No stenosis of the aortic valve.  6. The aorta is normal in size and structure.  7. The IVC was normal in  size. No complete TR doppler jet so unable to estimate PA systolic pressure.  04/19/2019 HRCT >> 1. The appearance of  the lungs is compatible with interstitial lung disease, with a spectrum of findings considered probable usual interstitial pneumonia (UIP) per current ATS guidelines. 2. Dilatation of the pulmonic trunk (3.9 cm in diameter), concerning for associated pulmonary arterial hypertension. 3. Aortic atherosclerosis, in addition to left main and 3 vessel coronary artery disease. Please note that although the presence of coronary artery calcium documents the presence of coronary artery disease, the severity of this disease and any potential stenosis cannot be assessed on this non-gated CT examination. Assessment for potential risk factor modification, dietary therapy or pharmacologic therapy may be warranted, if clinically indicated.  Micro Data:  8/25 SARS COV2 >> 8/26 MRSA PCR >> neg 8/25 UC >> < 10k colonies insignificant growth 8/25 BCx2 >>  Antimicrobials:  8/25 vancomycin >> 8/26 8/25 cefepime >> 8/26 8/25 flagyl   Interim history/subjective:  Currently, denies any SOB or muscle pain. On room air  Objective   Blood pressure 124/68, pulse (!) 57, temperature (!) 97.5 F (36.4 C), temperature source Oral, resp. rate 19, height 5\' 8"  (1.727 m), weight 85.5 kg, SpO2 98 %.        Intake/Output Summary (Last 24 hours) at 04/20/2019 1046 Last data filed at 04/20/2019 [id number] Gross per 24 hour  Intake 1735.23 ml  Output 600 ml  Net 1135.23 ml   Filed Weights   04/19/19 1318 04/20/19 0034  Weight: 81.6 kg 85.5 kg   Examination: General:  Elderly male sitting upright in bed in NAD HEENT: MM pink/moist Neuro: Alert/ oriented  CV: RR, no murmur PULM:  Clear anteriorly, mild bibasilar posterior crackles, speaking full sentences GI: soft, bs active  Extremities: warm/dry, +1LE edema  Skin: no rashes   Resolved Hospital Problem list    Assessment & Plan:   Dyspnea/ fever with suspected ILD process via imaging c/w U and IP process with positive ANCA and RF - currently not  requiring O2 - PCT negative, mild leukocytosis (possibly steroid induced) w/ fevers c/w most likely autoimmune process  - weights appear stable, euvolemic on exam  - HRCT with concern for elevated pulmonary artery hypertension however most recent TTE on 04/04/2019 reassuring  P:  -  D/c solucortef, change to prednisone 60 mg daily x1 week then decrease to 50 mg after 7 days untill seen by Dr. Vaughan Browner in office on 05/03/2019.  PFTs schedule for that day as well.  Additionally has rheumatology appt on 9/8 at 0800.  -  Will hold on further testing at this point.  Depending on rheumatology workup/ findings to determine further invasive pulmonary testing which can be decided on outpatient basis.   - Agree with stopping abx  - continue daily lasix 40 mg daily with daily weights  - continue daily PPI - wife at bedside.  All questions answered.    Remainder per primary.  Nothing further to add.  PCCM will sign off.  Please do not hesitate to call us back if we can be of any further assistance.   Labs   CBC: Recent Labs  Lab 04/19/19 0940 04/20/19 0501  WBC 15.8* 11.5*  NEUTROABS 13.0*  --   HGB 10.5* 9.4*  HCT 33.0* 28.7*  MCV 90.2 89.1  PLT 312 [id number]    Basic Metabolic Panel: Recent Labs  Lab 04/19/19 0940 04/20/19 0501  NA 129* 135  K 4.3 4.4  CL 97* 107  CO2 21* 20*  GLUCOSE 299* 190*  BUN 26* 25*  CREATININE 1.43* 1.03  CALCIUM 7.9* 7.5*   GFR: Estimated Creatinine Clearance: 59.8 mL/min (by C-G formula based on SCr of 1.03 mg/dL). Recent Labs  Lab 04/19/19 0940 04/19/19 1330 04/19/19 1528 04/20/19 0501  PROCALCITON  --   --  <0.10  --   WBC 15.8*  --   --  11.5*  LATICACIDVEN  --  2.6* 1.8  --     Liver Function Tests: Recent Labs  Lab 04/19/19 0940  AST 18  ALT 28  ALKPHOS 93  BILITOT 0.9  PROT 5.5*  ALBUMIN 2.4*   No results for input(s): LIPASE, AMYLASE in the last 168 hours. No results for input(s): AMMONIA in the last 168 hours.  ABG No results found  for: PHART, PCO2ART, PO2ART, HCO3, TCO2, ACIDBASEDEF, O2SAT   Coagulation Profile: No results for input(s): INR, PROTIME in the last 168 hours.  Cardiac Enzymes: No results for input(s): CKTOTAL, CKMB, CKMBINDEX, TROPONINI in the last 168 hours.  HbA1C: Hgb A1c MFr Bld  Date/Time Value Ref Range Status  04/03/2019 05:44 AM 6.2 (H) 4.8 - 5.6 % Final    Comment:    (NOTE) Pre diabetes:          5.7%-6.4% Diabetes:              >6.4% Glycemic control for   <7.0% adults with diabetes     CBG: Recent Labs  Lab 04/19/19 1738 04/19/19 2154 04/20/19 0719  GLUCAP 173* 239* 184*    Review of Systems:   Review of Systems  Constitutional: Positive for fever.  Respiratory: Positive for cough and shortness of breath. Negative for hemoptysis, sputum production and wheezing.   Cardiovascular: Positive for leg swelling. Negative for chest pain, palpitations and orthopnea.  Gastrointestinal: Negative for nausea and vomiting.  Musculoskeletal: Positive for myalgias. Negative for joint pain.  Skin: Negative for rash.    Past Medical History  He,  has a past medical history of Coronary artery disease, Diabetes ([place]), Hyperlipidemia, Hypertension, Osteoarthritis, Renal stones, and Skin cancer.   Surgical History    Past Surgical History:  Procedure Laterality Date  . BACK SURGERY    . CATARACT EXTRACTION    . COLONOSCOPY  06/16/2005   Mild colitis involving splenic flexure. Colonic polyps, status post polypectomy. Mild pancolonic diverticulitits. Internal hemorrhoids.   . ESOPHAGOGASTRODUODENOSCOPY  04/26/2003   Irregular Z line suggestive of GERD. Mild gastritis status post CLO testing.   Marland Kitchen TRIGGER FINGER RELEASE       Social History   reports that he has never smoked. He has never used smokeless tobacco. He reports that he does not drink alcohol or use drugs.   Family History   His family history includes Clotting disorder in his brother; Heart attack in his sister; Lung  disease in his sister; Pancreatic cancer in his sister; Stroke in his father; Tuberculosis in his mother.   Allergies Allergies  Allergen Reactions  . Ace Inhibitors Other (See Comments)    Slow heart rate  . Hydrocodone Nausea And Vomiting  . Hydrocodone-Acetaminophen Nausea And Vomiting  . Sulfamethoxazole-Trimethoprim     Got sicker     Home Medications  Prior to Admission medications   Medication Sig Start Date End Date Taking? Authorizing Provider  aspirin 81 MG EC tablet Take 162 mg by mouth daily.    Yes [provider]  Coenzyme Q10 (CO Q 10) 10 MG CAPS Take 1 capsule  by mouth daily.   Yes [provider]  furosemide (LASIX) 40 MG tablet Take 20 mg by mouth daily.    Yes [provider]  glipiZIDE (GLUCOTROL) 5 MG tablet Take 1 tablet (5 mg total) by mouth 2 (two) times daily. 04/06/19 05/06/19 Yes Vann, Jessica U, DO  losartan (COZAAR) 50 MG tablet Take 1 tablet by mouth daily.  12/06/18  Yes [provider]  metFORMIN (GLUCOPHAGE) 500 MG tablet Take 1 tablet (500 mg total) by mouth 2 (two) times daily with a meal. Patient taking differently: Take 500 mg by mouth 3 (three) times daily.  04/06/19  Yes Geradine Girt, DO  Multiple Vitamins-Minerals (PRESERVISION AREDS 2+MULTI VIT) CAPS Take 1 capsule by mouth 2 (two) times daily.   Yes [provider]  nitroGLYCERIN (NITROSTAT) 0.4 MG SL tablet Place 0.4 mg under the tongue every 5 (five) minutes as needed.   Yes [provider]  Omega-3 Fatty Acids (FISH OIL) 1000 MG CAPS Take 1,000 mg by mouth 2 (two) times daily.    Yes [provider]  omeprazole (PRILOSEC) 20 MG capsule Take 20 mg by mouth daily.   Yes [provider]  predniSONE (DELTASONE) 10 MG tablet 30 mg PO x 5 days then 20 mg PO until after rheumatology and/or pulmonology follow up Patient taking differently: Take 20 mg by mouth daily. 20 mg PO until after rheumatology and/or pulmonology follow up  04/06/19  Yes Eulogio Bear U, DO  traZODone (DESYREL) 50 MG tablet Take 75 mg by mouth at bedtime.    Yes [provider]  vitamin B-12 (CYANOCOBALAMIN) 1000 MCG tablet Take 1 tablet (1,000 mcg total) by mouth daily. 04/06/19  Yes Geradine Girt, DO         Kennieth Rad, MSN, AGACNP-BC [facility] Pgr: [phone] or if no answer [phone] 04/20/2019, 3:09 PM

## 2019-04-20 NOTE — Progress Notes (Signed)
Pt's wife [name] contacted via phone and informed pt was moved to 2W-21. Pt verbalized understanding of visitor policy and visiting hours.

## 2019-04-20 NOTE — Telephone Encounter (Signed)
Reviewed patient's chart, patient is admitted. Will keep in my inbox to follow up on.

## 2019-04-21 ENCOUNTER — Other Ambulatory Visit: Payer: Self-pay | Admitting: *Deleted

## 2019-04-21 ENCOUNTER — Telehealth: Payer: Medicare HMO | Admitting: Cardiology

## 2019-04-21 LAB — GLUCOSE, CAPILLARY: Glucose-Capillary: 115 mg/dL — ABNORMAL HIGH (ref 70–99)

## 2019-04-21 MED ORDER — PREDNISONE 10 MG PO TABS: ORAL_TABLET | ORAL | 1 refills | Status: DC

## 2019-04-21 NOTE — Consult Note (Signed)
   [facility] Inpatient Consult   04/21/2019  Kerry Matthews [birth year] [id number]    Patientwaschecked forpotential [place] Management services needed [name] plan with a 30 day readmission and 2 hospitalizations in the past 6 months, with 21%risk scorefor unplanned readmissions.  Perchart review andMDhistory & physical show as follows: 83 year old with past medical history significant for coronary artery disease, hypertension, diabetes and recent hospitalization with new diagnosis of interstitial lung disease with abnormal ANCA/elevated rheumatoid factor at that time presenting with fever and shortness of breath, ultimately diagnosed with probably interstitial pneumonitis/polymyositis  He presented dizzy, weak, sore all over, with a temperature of 101. He was also complaining of worsening shortness of breath, slight worsening cough.(SIRS-systemic inflammatory response syndrome, infectious versus rheumatologic source)  Patient was followed by Infectious Disease and Inpatient DM coordinator on this admission.   His primary Care Provider isDr. Kirsten  Cox with [facility],listed to provide transition of care.  Patient was transitionedto home prior to speaking withhim.  Will followpatientwith EMMI Generalcalls to monitor recovery.   For questionsand additional information,please call:  [name], BSN, RN-BC [facility] Liaison Cell: [phone]

## 2019-04-21 NOTE — Progress Notes (Signed)
SATURATION QUALIFICATIONS: (This note is used to comply with regulatory documentation for home oxygen)  Patient Saturations on Room Air at Rest = 99%  Patient Saturations on Room Air while Ambulating = 97%

## 2019-04-21 NOTE — Progress Notes (Signed)
Inpatient Diabetes Program Recommendations  AACE/ADA: New Consensus Statement on Inpatient Glycemic Control (2015)  Target Ranges:  Prepandial:   less than 140 mg/dL      Peak postprandial:   less than 180 mg/dL (1-2 hours)      Critically ill patients:  140 - 180 mg/dL   Results for DESHANE, CORKER (MRN [id number]) as of 04/21/2019 08:46  Ref. Range 04/20/2019 07:19 04/20/2019 11:16 04/20/2019 16:40 04/20/2019 20:52  Glucose-Capillary Latest Ref Range: 70 - 99 mg/dL 184 (H) 303 (H) 296 (H) 286 (H)   Results for CASTULO, IMEL (MRN [id number]) as of 04/21/2019 08:46  Ref. Range 04/21/2019 08:37  Glucose-Capillary Latest Ref Range: 70 - 99 mg/dL 115 (H)     Home DM Meds: Glipizide 5 mg BID       Metformin 500 mg TID  Current Orders: Lantus 7 units Daily      Novolog Sensitive Correction Scale/ SSI (0-9 units) TID AC + HS    Solucortef stopped--last dose given yest at 10am.  To start Prednisone 60 mg Daily today.     MD- Note Lantus started yesterday afternoon.  CBGs elevated likely from the steroids.  If post-meal CBGs continue to be elevated, may consider adding Novolog Meal Coverage:  Novolog 4 units TID with meals to start  (Please add the following Hold Parameters: Hold if pt eats <50% of meal, Hold if pt NPO)     --Will follow patient during hospitalization--  Wyn Quaker RN, MSN, CDE Diabetes Coordinator Inpatient Glycemic Control Team Team Pager: [phone] (8a-5p)

## 2019-04-22 NOTE — Progress Notes (Signed)
Patient's blood work is come back for alpha-1 antitrypsin phenotype.  His alpha-1 levels are normal.  This is good news.  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  This can be further described/explained at his next office visit.  No further changes with plan of care at this time.  Wyn Quaker, FNP

## 2019-04-24 LAB — CULTURE, BLOOD (ROUTINE X 2)
Culture: NO GROWTH
Culture: NO GROWTH
Special Requests: ADEQUATE
Special Requests: ADEQUATE

## 2019-04-26 NOTE — Telephone Encounter (Signed)
Patient is already scheduled on 9/8 to establish care with Dr. Vaughan Browner after a PFT.  Can we just ensure that this is a 30-minute visit as this is a new patient to Dr. Vaughan Browner.  Also a hospital follow-up.   Thank you   Wyn Quaker FNP

## 2019-04-26 NOTE — Telephone Encounter (Signed)
Appointment has been changed to a 30 min visit and the appt note has been updated.   Will keep encounter in my inbox for further follow ups.

## 2019-04-28 ENCOUNTER — Encounter: Payer: Self-pay | Admitting: Gastroenterology

## 2019-04-28 ENCOUNTER — Other Ambulatory Visit: Payer: Self-pay

## 2019-04-28 ENCOUNTER — Telehealth (INDEPENDENT_AMBULATORY_CARE_PROVIDER_SITE_OTHER): Payer: Medicare HMO | Admitting: Gastroenterology

## 2019-04-28 VITALS — Ht 68.0 in | Wt 184.0 lb

## 2019-04-28 DIAGNOSIS — R945 Abnormal results of liver function studies: Secondary | ICD-10-CM | POA: Diagnosis not present

## 2019-04-28 DIAGNOSIS — R7989 Other specified abnormal findings of blood chemistry: Secondary | ICD-10-CM

## 2019-04-28 NOTE — Patient Instructions (Addendum)
If you are age 81 or older, your body mass index should be between 23-30. Your Body mass index is 27.98 kg/m. If this is out of the aforementioned range listed, please consider follow up with your Primary Care Provider.  If you are age 19 or younger, your body mass index should be between 19-25. Your Body mass index is 27.98 kg/m. If this is out of the aformentioned range listed, please consider follow up with your Primary Care Provider.   To help prevent the possible spread of infection to our patients, communities, and staff; we will be implementing the following measures:  As of now we are not allowing any visitors/family members to accompany you to any upcoming appointments with [facility]. If you have any concerns about this please contact our office to discuss prior to the appointment.   Please call our office at [phone] to set up your 2 year follow up visit.  Thank you,  Dr. Jackquline Denmark

## 2019-04-28 NOTE — Progress Notes (Signed)
Chief Complaint:   Referring Provider:  [name], MD      ASSESSMENT AND PLAN;   #1. Abn LFTs (resolved).  No indication for liver biopsy.  #2. H/O sepsis with FUO with new Dx of ILD/polymyositis (Adm 8/7-8/07/2019). Neg Covid 19 x 2.  #3.  Comorbid conditions CAD, HTN, DM, abnormal ANCA/elevated RF.  Plan: - Hold off on any GI intervention as he is not having any GI problems. - Proceed with rheumatology work-up and pulmonary work-up for now. - RTC in 24 weeks.  Once better and cleared by other subspecialties, would consider endoscopic eval. - Call if any GI problems. - Discussed in detail with the patient and patient's family.  HPI:    Kerry Matthews is a 83 y.o. male  Follow-up Admitted to [facility] with sepsis -infectious vs rheumatologic vs pulmonary.  Responded well to antibiotics and steroids.  Found to have abnormal LFTs.  Ultrasound showed fatty liver without any biliary ductal dilatation.  Has been treated with empiric antibiotics.  Chest x-ray showed pneumonitis with underlying ILD.  Repeat LFTs after discharge were all normal as below.  He denies having any GI symptoms. No nausea, vomiting, heartburn, regurgitation, odynophagia or dysphagia.  No significant diarrhea or constipation.  There is no melena or hematochezia. No weight loss.  No jaundice dark urine or pale stools.  Review of CT did not show any evidence of colitis.   Review of GI work-up:  CT AP/chest with contrast 04/01/2019: -No acute process in chest abdomen or pelvis. -ILD likely due to pulmonary fibrosis. -Mild hepatic steatosis. -PAD enlargement suggestive of pulmonary artery hypertension. -Right inguinal hernia containing fat.  Ultrasound 04/01/2019: -Fatty liver. -Negative for gallstones or biliary ductal dilatation.  Colonoscopy 06/16/2005 -Mild left-sided colitis up to splenic flexure (infectious vs ischemic vs UC) Bx-not entirely diagnostic.  Read as nonspecific chronic colitis, mild.   Had few plasma cells, eosinophils and occasional lymphocytes. -Colonic polyps S/P polypectomy. Bx- TA -Mild sigmoid diverticulosis. -He was supposed to have repeat colonoscopy in 3 years.  Did not come for visit despite letters. -Wants to hold off on any GI evaluation at the present time.  EGD 04/26/2003 -Irregular Z line suggestive of GERD.  Mild gastritis. -Do not have the biopsy report as yet.    Past Medical History:  Diagnosis Date   Coronary artery disease    Diabetes ([place])    Hyperlipidemia    Hypertension    Osteoarthritis    Renal stones    Skin cancer    UC (ulcerative colitis) ([place])    UTI (urinary tract infection)     Past Surgical History:  Procedure Laterality Date   ANGIOPLASTY     BACK SURGERY     CATARACT EXTRACTION     COLONOSCOPY  06/16/2005   Mild colitis involving splenic flexure. Colonic polyps, status post polypectomy. Mild pancolonic diverticulitits. Internal hemorrhoids.    ESOPHAGOGASTRODUODENOSCOPY  04/26/2003   Irregular Z line suggestive of GERD. Mild gastritis status post CLO testing.    TRIGGER FINGER RELEASE      Family History  Problem Relation Age of Onset   Tuberculosis Mother    Stroke Father    Pancreatic cancer Sister    Heart attack Sister    Lung disease Sister    Clotting disorder Brother    Colon cancer Neg Hx    Esophageal cancer Neg Hx     Social History   Tobacco Use   Smoking status: Never Smoker   Smokeless  tobacco: Never Used  Substance Use Topics   Alcohol use: Never    Frequency: Never   Drug use: Never    Current Outpatient Medications  Medication Sig Dispense Refill   aspirin 81 MG EC tablet Take 81 mg by mouth daily.      Coenzyme Q10 (CO Q 10) 10 MG CAPS Take 1 capsule by mouth daily.     furosemide (LASIX) 40 MG tablet Take 20 mg by mouth daily.      glipiZIDE (GLUCOTROL) 5 MG tablet Take 1 tablet (5 mg total) by mouth 2 (two) times daily. 60 tablet 0   losartan  (COZAAR) 50 MG tablet Take 1 tablet by mouth daily.      metFORMIN (GLUCOPHAGE) 500 MG tablet Take 1 tablet (500 mg total) by mouth 2 (two) times daily with a meal. (Patient taking differently: Take 500 mg by mouth 3 (three) times daily as needed. ) 60 tablet 0   Multiple Vitamins-Minerals (PRESERVISION AREDS 2+MULTI VIT) CAPS Take 1 capsule by mouth 2 (two) times daily.     nitroGLYCERIN (NITROSTAT) 0.4 MG SL tablet Place 0.4 mg under the tongue every 5 (five) minutes as needed.     Omega-3 Fatty Acids (FISH OIL) 1000 MG CAPS Take 1,000 mg by mouth 2 (two) times daily.      omeprazole (PRILOSEC) 20 MG capsule Take 20 mg by mouth daily.     predniSONE (DELTASONE) 10 MG tablet Take 6 tablets daily for 5 days then 5 tablets daily until you follow with your pulmonologist. Your pulmonologist will advise further doses. 90 tablet 1   traZODone (DESYREL) 50 MG tablet Take 75 mg by mouth at bedtime.      vitamin B-12 (CYANOCOBALAMIN) 1000 MCG tablet Take 1 tablet (1,000 mcg total) by mouth daily. 30 tablet 0   No current facility-administered medications for this visit.     Allergies  Allergen Reactions   Ace Inhibitors Other (See Comments)    Slow heart rate   Hydrocodone Nausea And Vomiting   Hydrocodone-Acetaminophen Nausea And Vomiting   Sulfamethoxazole-Trimethoprim     Got sicker    Review of Systems:  Constitutional: Denies fever, chills, diaphoresis, appetite change and fatigue.  HEENT: Denies photophobia, eye pain, redness, hearing loss, ear pain, congestion, sore throat, rhinorrhea, sneezing, mouth sores, neck pain, neck stiffness and tinnitus.   Respiratory: Denies SOB, DOE, cough, chest tightness,  and wheezing.   Cardiovascular: Denies chest pain, palpitations and leg swelling.  Genitourinary: Denies dysuria, urgency, frequency, hematuria, flank pain and difficulty urinating.  Musculoskeletal: Denies myalgias, back pain, joint swelling, arthralgias and gait problem.    Skin: No rash.  Neurological: Denies dizziness, seizures, syncope, weakness, light-headedness, numbness and headaches.  Hematological: Denies adenopathy. Easy bruising, personal or family bleeding history  Psychiatric/Behavioral: No anxiety or depression     Physical Exam:    Ht 5\' 8"  (1.727 m)    Wt 184 lb (83.5 kg)    BMI 27.98 kg/m  Filed Weights   04/28/19 0916  Weight: 184 lb (83.5 kg)   Constitutional:  Well-developed, in no acute distress. Psychiatric: Normal mood and affect. Behavior is normal. HEENT: Pupils normal.  Conjunctivae are normal. No scleral icterus. Neck supple.  Cardiovascular: Normal rate, regular rhythm. No edema Pulmonary/chest: Effort normal and breath sounds normal. No wheezing, rales or rhonchi. Abdominal: Soft, nondistended. Nontender. Bowel sounds active throughout. There are no masses palpable. No hepatomegaly. Rectal:  defered Neurological: Alert and oriented to person place and time. Skin:  Skin is warm and dry. No rashes noted.  Data Reviewed: I have personally reviewed following labs and imaging studies  CBC: CBC Latest Ref Rng & Units 04/20/2019 04/19/2019 04/06/2019  WBC 4.0 - 10.5 K/uL 11.5(H) 15.8(H) 16.6(H)  Hemoglobin 13.0 - 17.0 g/dL 9.4(L) 10.5(L) 10.8(L)  Hematocrit 39.0 - 52.0 % 28.7(L) 33.0(L) 32.1(L)  Platelets 150 - 400 K/uL 228 312 539(H)    CMP: CMP Latest Ref Rng & Units 04/20/2019 04/19/2019 04/06/2019  Glucose 70 - 99 mg/dL 190(H) 299(H) 193(H)  BUN 8 - 23 mg/dL 25(H) 26(H) 34(H)  Creatinine 0.61 - 1.24 mg/dL 1.03 1.43(H) 1.13  Sodium 135 - 145 mmol/L 135 129(L) 133(L)  Potassium 3.5 - 5.1 mmol/L 4.4 4.3 4.3  Chloride 98 - 111 mmol/L 107 97(L) 100  CO2 22 - 32 mmol/L 20(L) 21(L) 26  Calcium 8.9 - 10.3 mg/dL 7.5(L) 7.9(L) 8.1(L)  Total Protein 6.5 - 8.1 g/dL - 5.5(L) 5.3(L)  Total Bilirubin 0.3 - 1.2 mg/dL - 0.9 0.4  Alkaline Phos 38 - 126 U/L - 93 147(H)  AST 15 - 41 U/L - 18 114(H)  ALT 0 - 44 U/L - 28 205(H)      Recent Results (from the past 240 hour(s))  Culture, blood (routine x 2)     Status: None   Collection Time: 04/19/19 12:42 PM   Specimen: BLOOD  Result Value Ref Range Status   Specimen Description BLOOD LEFT ANTECUBITAL  Final   Special Requests   Final    BOTTLES DRAWN AEROBIC AND ANAEROBIC Blood Culture adequate volume   Culture   Final    NO GROWTH 5 DAYS Performed at [facility], [place] [address], [place], [place] [address]    Report Status 04/24/2019 FINAL  Final  Culture, blood (routine x 2)     Status: None   Collection Time: 04/19/19  1:25 PM   Specimen: BLOOD RIGHT HAND  Result Value Ref Range Status   Specimen Description BLOOD RIGHT HAND  Final   Special Requests   Final    BOTTLES DRAWN AEROBIC AND ANAEROBIC Blood Culture adequate volume   Culture   Final    NO GROWTH 5 DAYS Performed at [facility], [place] [address] [place], [place] [address]    Report Status 04/24/2019 FINAL  Final  Urine culture     Status: Abnormal   Collection Time: 04/19/19  1:46 PM   Specimen: Urine, Random  Result Value Ref Range Status   Specimen Description URINE, RANDOM  Final   Special Requests NONE  Final   Culture (A)  Final    <10,000 COLONIES/mL INSIGNIFICANT GROWTH Performed at [facility], [place] [address] [place], [place] [address]    Report Status 04/20/2019 FINAL  Final  SARS CORONAVIRUS 2 (TAT 6-12 HRS) Nasal Swab Aptima Multi Swab     Status: None   Collection Time: 04/19/19  3:28 PM   Specimen: Aptima Multi Swab; Nasal Swab  Result Value Ref Range Status   SARS Coronavirus 2 NEGATIVE NEGATIVE Final    Comment: (NOTE) SARS-CoV-2 target nucleic acids are NOT DETECTED. The SARS-CoV-2 RNA is generally detectable in upper and lower respiratory specimens during the acute phase of infection. Negative results do not preclude SARS-CoV-2 infection, do not rule out co-infections with other pathogens, and should not be used as the sole basis for  treatment or other patient management decisions. Negative results must be combined with clinical observations, patient history, and epidemiological information.  The expected result is Negative. Fact Sheet for Patients: SugarRoll.be Fact Sheet for Healthcare Providers: https://www.woods-mathews.com/ This test is not yet approved or cleared by the [place] FDA and  has been authorized for detection and/or diagnosis of SARS-CoV-2 by FDA under an Emergency Use Authorization (EUA). This EUA will remain  in effect (meaning this test can be used) for the duration of the COVID-19 declaration under Section 56 4(b)(1) of the Act, 21 U.S.C. section 360bbb-3(b)(1), unless the authorization is terminated or revoked sooner. Performed at [facility], [place] [address] [place], [place] [address]   MRSA PCR Screening     Status: None   Collection Time: 04/20/19 12:48 AM   Specimen: Nasopharyngeal  Result Value Ref Range Status   MRSA by PCR NEGATIVE NEGATIVE Final    Comment:        The GeneXpert MRSA Assay (FDA approved for NASAL specimens only), is one component of a comprehensive MRSA colonization surveillance program. It is not intended to diagnose MRSA infection nor to guide or monitor treatment for MRSA infections. Performed at [facility], [place] [address] [place], [place] [address]       Radiology Studies: Dg Chest 2 View  Result Date: 04/19/2019 CLINICAL DATA:  Fever.  Weakness.  Dizziness. EXAM: CHEST - 2 VIEW COMPARISON:  CT 04/01/2019.  Chest x-ray 04/01/2019. FINDINGS: Prominence of the anterior mediastinum is stable and consistent with prominent mediastinal fat as noted on prior CT. Hilar structures normal. Stable bilateral mild interstitial prominence consistent chronic interstitial lung disease again noted. Lungs are clear of acute infiltrates r. No pleural effusion or pneumothorax. Cardiomegaly with normal pulmonary  vascularity. Degenerative changes thoracic spine. IMPRESSION: No acute cardiopulmonary disease. Electronically Signed   By: [place]   On: 04/19/2019 13:09   Ct Chest W Contrast  Result Date: 04/01/2019 CLINICAL DATA:  Cough and fevers. Negative COVID-19 tests x2. Elevated liver function tests. Acute respiratory illness. EXAM: CT CHEST, ABDOMEN, AND PELVIS WITH CONTRAST TECHNIQUE: Multidetector CT imaging of the chest, abdomen and pelvis was performed following the standard protocol during bolus administration of intravenous contrast. CONTRAST:  17mL OMNIPAQUE IOHEXOL 300 MG/ML  SOLN COMPARISON:  Chest radiograph earlier today.  No comparison CTs. FINDINGS: CT CHEST FINDINGS Cardiovascular: Aortic atherosclerosis. Tortuous thoracic aorta. Borderline cardiomegaly, without pericardial effusion. Multivessel coronary artery atherosclerosis. Pulmonary artery enlargement, outflow tract 3.3 cm. No central pulmonary embolism, on this non-dedicated study. Mediastinum/Nodes: borderline enlarged right paratracheal node of 1.0 cm on image 27/3. A subcarinal node measures 1.2 cm on 33/3. No hilar adenopathy. Lungs/Pleura: No pleural fluid. Diffuse but slightly basilar predominant subpleural reticulation with traction bronchiolectasis. This is slightly worse on the left. No areas of consolidation or ground-glass to suggest pneumonia. Musculoskeletal: No acute osseous abnormality. CT ABDOMEN PELVIS FINDINGS Hepatobiliary: Suspicion of mild hepatic steatosis. Normal gallbladder, without biliary ductal dilatation. Pancreas: Fatty atrophy throughout the pancreas. Spleen: Normal in size, without focal abnormality. Adrenals/Urinary Tract: Normal adrenal glands. Mild renal cortical thinning bilaterally. Bilateral renal sinus cysts, but no hydronephrosis. Mild bladder distension with minimal right-sided bladder at the origin of the right inguinal hernia. Example image 110/3 and coronal image 55. Stomach/Bowel: Normal stomach,  without wall thickening. Scattered colonic diverticula. Normal terminal ileum. Normal small bowel. Vascular/Lymphatic: Aortic and branch vessel atherosclerosis. No abdominopelvic adenopathy. Reproductive: Normal prostate. Other: No significant free fluid. Right inguinal hernia contains fat and minimal urinary bladder. Musculoskeletal: Mild osteopenia. Convex right lumbar spine curvature. Lumbar spondylosis. IMPRESSION: 1. No acute  process in the chest, abdomen, or pelvis. No acute explanation for patient's symptoms. 2. Interstitial lung disease, suspicious for early/mild usual interstitial pneumonitis (typically due to pulmonary fibrosis). Differential considerations include nonspecific interstitial pneumonitis. Depending on ongoing chronic clinical symptomatology, consider high-resolution chest CT at 6-12 months. 3. Suspicion of mild hepatic steatosis. 4. Coronary artery atherosclerosis. Aortic Atherosclerosis (ICD10-I70.0). 5. Pulmonary artery enlargement suggests pulmonary arterial hypertension. 6. Right inguinal hernia containing fat and minimal urinary bladder at its origin. 7. Borderline thoracic adenopathy, favored to be reactive. Electronically Signed   By: Abigail Miyamoto M.D.   On: 04/01/2019 19:06   Ct Abdomen Pelvis W Contrast  Result Date: 04/01/2019 CLINICAL DATA:  Cough and fevers. Negative COVID-19 tests x2. Elevated liver function tests. Acute respiratory illness. EXAM: CT CHEST, ABDOMEN, AND PELVIS WITH CONTRAST TECHNIQUE: Multidetector CT imaging of the chest, abdomen and pelvis was performed following the standard protocol during bolus administration of intravenous contrast. CONTRAST:  134mL OMNIPAQUE IOHEXOL 300 MG/ML  SOLN COMPARISON:  Chest radiograph earlier today.  No comparison CTs. FINDINGS: CT CHEST FINDINGS Cardiovascular: Aortic atherosclerosis. Tortuous thoracic aorta. Borderline cardiomegaly, without pericardial effusion. Multivessel coronary artery atherosclerosis. Pulmonary artery  enlargement, outflow tract 3.3 cm. No central pulmonary embolism, on this non-dedicated study. Mediastinum/Nodes: borderline enlarged right paratracheal node of 1.0 cm on image 27/3. A subcarinal node measures 1.2 cm on 33/3. No hilar adenopathy. Lungs/Pleura: No pleural fluid. Diffuse but slightly basilar predominant subpleural reticulation with traction bronchiolectasis. This is slightly worse on the left. No areas of consolidation or ground-glass to suggest pneumonia. Musculoskeletal: No acute osseous abnormality. CT ABDOMEN PELVIS FINDINGS Hepatobiliary: Suspicion of mild hepatic steatosis. Normal gallbladder, without biliary ductal dilatation. Pancreas: Fatty atrophy throughout the pancreas. Spleen: Normal in size, without focal abnormality. Adrenals/Urinary Tract: Normal adrenal glands. Mild renal cortical thinning bilaterally. Bilateral renal sinus cysts, but no hydronephrosis. Mild bladder distension with minimal right-sided bladder at the origin of the right inguinal hernia. Example image 110/3 and coronal image 55. Stomach/Bowel: Normal stomach, without wall thickening. Scattered colonic diverticula. Normal terminal ileum. Normal small bowel. Vascular/Lymphatic: Aortic and branch vessel atherosclerosis. No abdominopelvic adenopathy. Reproductive: Normal prostate. Other: No significant free fluid. Right inguinal hernia contains fat and minimal urinary bladder. Musculoskeletal: Mild osteopenia. Convex right lumbar spine curvature. Lumbar spondylosis. IMPRESSION: 1. No acute process in the chest, abdomen, or pelvis. No acute explanation for patient's symptoms. 2. Interstitial lung disease, suspicious for early/mild usual interstitial pneumonitis (typically due to pulmonary fibrosis). Differential considerations include nonspecific interstitial pneumonitis. Depending on ongoing chronic clinical symptomatology, consider high-resolution chest CT at 6-12 months. 3. Suspicion of mild hepatic steatosis. 4. Coronary  artery atherosclerosis. Aortic Atherosclerosis (ICD10-I70.0). 5. Pulmonary artery enlargement suggests pulmonary arterial hypertension. 6. Right inguinal hernia containing fat and minimal urinary bladder at its origin. 7. Borderline thoracic adenopathy, favored to be reactive. Electronically Signed   By: Abigail Miyamoto M.D.   On: 04/01/2019 19:06   Ct Chest High Resolution  Result Date: 04/19/2019 CLINICAL DATA:  83 year old male with history of fever of unknown origin. Evaluate for interstitial lung disease. EXAM: CT CHEST WITHOUT CONTRAST TECHNIQUE: Multidetector CT imaging of the chest was performed following the standard protocol without intravenous contrast. High resolution imaging of the lungs, as well as inspiratory and expiratory imaging, was performed. COMPARISON:  Chest CT 04/01/2019. FINDINGS: Cardiovascular: Heart size is borderline enlarged. There is no significant pericardial fluid, thickening or pericardial calcification. There is aortic atherosclerosis, as well as atherosclerosis of the great vessels of the mediastinum  and the coronary arteries, including calcified atherosclerotic plaque in the left main, left anterior descending, left circumflex and right coronary arteries. Dilatation of the pulmonic trunk (3.9 cm in diameter). Mediastinum/Nodes: No pathologically enlarged mediastinal or hilar lymph nodes. Please note that accurate exclusion of hilar adenopathy is limited on noncontrast CT scans. Esophagus is unremarkable in appearance. No axillary lymphadenopathy. Lungs/Pleura: High-resolution images demonstrate patchy peripheral predominant areas of ground-glass attenuation and septal thickening with some subpleural reticulation and areas of mild cylindrical traction bronchiectasis and peripheral bronchiolectasis. These findings have a definitive craniocaudal gradient. There may be some areas of developing honeycombing in the extreme lung bases, however, this is not yet definitive at this time.  Inspiratory and expiratory imaging is unremarkable. No acute consolidative airspace disease. No pleural effusions. Upper Abdomen: Aortic atherosclerosis. Musculoskeletal: There are no aggressive appearing lytic or blastic lesions noted in the visualized portions of the skeleton. IMPRESSION: 1. The appearance of the lungs is compatible with interstitial lung disease, with a spectrum of findings considered probable usual interstitial pneumonia (UIP) per current ATS guidelines. 2. Dilatation of the pulmonic trunk (3.9 cm in diameter), concerning for associated pulmonary arterial hypertension. 3. Aortic atherosclerosis, in addition to left main and 3 vessel coronary artery disease. Please note that although the presence of coronary artery calcium documents the presence of coronary artery disease, the severity of this disease and any potential stenosis cannot be assessed on this non-gated CT examination. Assessment for potential risk factor modification, dietary therapy or pharmacologic therapy may be warranted, if clinically indicated. Aortic Atherosclerosis (ICD10-I70.0). Electronically Signed   By: Vinnie Langton M.D.   On: 04/19/2019 16:56   Dg Chest Port 1 View  Result Date: 04/01/2019 CLINICAL DATA:  Cough, shortness of breath and weakness for 3-4 months, COVID-19 like symptoms but 2 negative COVID-19 tests EXAM: PORTABLE CHEST 1 VIEW COMPARISON:  Portable exam 1527 hours compared to 03/25/2019 FINDINGS: Normal heart size, mediastinal contours, and pulmonary vascularity. Chronic interstitial lung disease at the periphery of the mid to lower lungs bilaterally greater on LEFT. No definite acute infiltrate, pleural effusion or pneumothorax. Bones demineralized. IMPRESSION: Chronic interstitial disease changes greater on LEFT than RIGHT. No acute abnormalities. Electronically Signed   By: Lavonia Dana M.D.   On: 04/01/2019 15:48   Vas [place] Lower Extremity Venous (dvt)  Result Date: 04/03/2019  Lower Venous Study  Indications: Edema.  Comparison Study: no prior Performing Technologist: Abram Sander RVS  Examination Guidelines: A complete evaluation includes B-mode imaging, spectral Doppler, color Doppler, and power Doppler as needed of all accessible portions of each vessel. Bilateral testing is considered an integral part of a complete examination. Limited examinations for reoccurring indications may be performed as noted.  +---------+---------------+---------+-----------+----------+-------+  RIGHT     Compressibility Phasicity Spontaneity Properties Summary  +---------+---------------+---------+-----------+----------+-------+  CFV       Full            Yes       Yes                             +---------+---------------+---------+-----------+----------+-------+  SFJ       Full                                                      +---------+---------------+---------+-----------+----------+-------+  FV Prox   Full                                                      +---------+---------------+---------+-----------+----------+-------+  FV Mid    Full                                                      +---------+---------------+---------+-----------+----------+-------+  FV Distal Full                                                      +---------+---------------+---------+-----------+----------+-------+  PFV       Full                                                      +---------+---------------+---------+-----------+----------+-------+  POP       Full            Yes       Yes                             +---------+---------------+---------+-----------+----------+-------+  PTV       Full                                                      +---------+---------------+---------+-----------+----------+-------+  PERO      Full                                                      +---------+---------------+---------+-----------+----------+-------+   +---------+---------------+---------+-----------+----------+-------+  LEFT       Compressibility Phasicity Spontaneity Properties Summary  +---------+---------------+---------+-----------+----------+-------+  CFV       Full            Yes       Yes                             +---------+---------------+---------+-----------+----------+-------+  SFJ       Full                                                      +---------+---------------+---------+-----------+----------+-------+  FV Prox   Full                                                      +---------+---------------+---------+-----------+----------+-------+  FV Mid    Full                                                      +---------+---------------+---------+-----------+----------+-------+  FV Distal Full                                                      +---------+---------------+---------+-----------+----------+-------+  PFV       Full                                                      +---------+---------------+---------+-----------+----------+-------+  POP       Full            Yes       Yes                             +---------+---------------+---------+-----------+----------+-------+  PTV       Full                                                      +---------+---------------+---------+-----------+----------+-------+  PERO      Full                                                      +---------+---------------+---------+-----------+----------+-------+     Summary: Right: There is no evidence of deep vein thrombosis in the lower extremity. No cystic structure found in the popliteal fossa. Left: There is no evidence of deep vein thrombosis in the lower extremity. No cystic structure found in the popliteal fossa.  *See table(s) above for measurements and observations. Electronically signed by Harold Barban MD on 04/03/2019 at 12:31:38 PM.    Final    US Abdomen Limited Ruq  Result Date: 04/01/2019 CLINICAL DATA:  Elevated LFTs EXAM: ULTRASOUND ABDOMEN LIMITED RIGHT UPPER QUADRANT COMPARISON:  None. FINDINGS: Gallbladder:  No gallstones or wall thickening visualized. No sonographic Murphy sign noted by sonographer. Common bile duct: Diameter: 5.3 mm Liver: Liver is echogenic. No focal hepatic abnormality. Portal vein is patent on color Doppler imaging with normal direction of blood flow towards the liver. Other: None. IMPRESSION: 1. Negative for gallstones or biliary dilatation 2. Echogenic liver consistent with steatosis and or hepatocellular disease. Electronically Signed   By: Donavan Foil M.D.   On: 04/01/2019 16:57  Extensive notes were reviewed.  This service was provided via telemedicine.  Zoom video failed. The patient was located at home.  The provider was located in office.  The patient did consent to this telephone visit and is aware of possible charges through their insurance for this visit.  The patient was referred by Dr. Tobie Poet.  The  other persons participating in this telemedicine service were wife and their role was coordination of care.  Time spent on call/review of [place] min    Carmell Austria, MD 04/28/2019, 2:28 PM  Cc: [name], MD

## 2019-04-29 ENCOUNTER — Ambulatory Visit (INDEPENDENT_AMBULATORY_CARE_PROVIDER_SITE_OTHER): Payer: Medicare HMO | Admitting: Cardiology

## 2019-04-29 ENCOUNTER — Other Ambulatory Visit (HOSPITAL_COMMUNITY)
Admission: RE | Admit: 2019-04-29 | Discharge: 2019-04-29 | Disposition: A | Discharge status: Home / Self Care | Payer: Medicare HMO | Source: Ambulatory Visit | Attending: Pulmonary Disease | Admitting: Pulmonary Disease

## 2019-04-29 ENCOUNTER — Encounter: Payer: Self-pay | Admitting: Cardiology

## 2019-04-29 VITALS — BP 140/52 | HR 75 | Ht 68.0 in | Wt 185.0 lb

## 2019-04-29 DIAGNOSIS — I2510 Atherosclerotic heart disease of native coronary artery without angina pectoris: Secondary | ICD-10-CM

## 2019-04-29 DIAGNOSIS — Z20828 Contact with and (suspected) exposure to other viral communicable diseases: Secondary | ICD-10-CM | POA: Diagnosis not present

## 2019-04-29 DIAGNOSIS — R7989 Other specified abnormal findings of blood chemistry: Secondary | ICD-10-CM

## 2019-04-29 DIAGNOSIS — E782 Mixed hyperlipidemia: Secondary | ICD-10-CM

## 2019-04-29 DIAGNOSIS — R945 Abnormal results of liver function studies: Secondary | ICD-10-CM | POA: Diagnosis not present

## 2019-04-29 DIAGNOSIS — I10 Essential (primary) hypertension: Secondary | ICD-10-CM | POA: Diagnosis not present

## 2019-04-29 DIAGNOSIS — Z01812 Encounter for preprocedural laboratory examination: Secondary | ICD-10-CM | POA: Diagnosis not present

## 2019-04-29 LAB — SARS CORONAVIRUS 2 (TAT 6-24 HRS): SARS Coronavirus 2: NEGATIVE

## 2019-04-29 NOTE — Patient Instructions (Signed)

## 2019-04-29 NOTE — Progress Notes (Signed)
Cardiology Office Note:    Date:  04/29/2019   ID:  Kerry Matthews, DOB 1936-08-17, MRN [id number]  PCP:  [name], MD  Cardiologist:  Jenean Lindau, MD   Referring MD: [name], MD    ASSESSMENT:    1. Atherosclerosis of native coronary artery of native heart without angina pectoris   2. Essential hypertension   3. Elevated LFTs   4. Mixed hyperlipidemia    PLAN:    In order of problems listed above:  1. Coronary artery disease: Secondary prevention stressed with the patient.  Importance of compliance with diet and medication stressed and he vocalized understanding. 2. Essential hypertension: Blood pressure stable.  His medications have been reduced because he had episodes of hypotension. 3. Mixed dyslipidemia: Diet was emphasized.  He is not on statin because of elevated LFTs.  He is seeing his gastroenterologist for the same reason. 4. Patient will be seen in follow-up appointment in 6 months or earlier if the patient has any concerns    Medication Adjustments/Labs and Tests Ordered: Current medicines are reviewed at length with the patient today.  Concerns regarding medicines are outlined above.  No orders of the defined types were placed in this encounter.  No orders of the defined types were placed in this encounter.    Chief Complaint  Patient presents with  . Follow-up     History of Present Illness:    Kerry Matthews is a 83 y.o. male.  Patient has past medical history of coronary artery disease, essential hypertension.  He has been dealing with sepsis and elevated liver function test.  He has had 2 hospitalizations.  He tells me that he has been tested multiple times negative for COVID.  Denies any chest pain orthopnea or PND.  Echocardiogram and EKG done at [facility] were unremarkable and reviewed by me.  At the time of my evaluation, the patient is alert awake oriented and in no distress.  Past Medical History:  Diagnosis Date  .  Coronary artery disease   . Diabetes ([place])   . Hyperlipidemia   . Hypertension   . Osteoarthritis   . Renal stones   . Skin cancer   . UC (ulcerative colitis) ([place])   . UTI (urinary tract infection)     Past Surgical History:  Procedure Laterality Date  . ANGIOPLASTY    . BACK SURGERY    . CATARACT EXTRACTION    . COLONOSCOPY  06/16/2005   Mild colitis involving splenic flexure. Colonic polyps, status post polypectomy. Mild pancolonic diverticulitits. Internal hemorrhoids.   . ESOPHAGOGASTRODUODENOSCOPY  04/26/2003   Irregular Z line suggestive of GERD. Mild gastritis status post CLO testing.   Marland Kitchen TRIGGER FINGER RELEASE      Current Medications: Current Meds  Medication Sig  . aspirin 81 MG EC tablet Take 81 mg by mouth daily.   . Coenzyme Q10 (CO Q 10) 10 MG CAPS Take 1 capsule by mouth daily.  . furosemide (LASIX) 40 MG tablet Take 20 mg by mouth daily.   Marland Kitchen glipiZIDE (GLUCOTROL) 5 MG tablet Take 1 tablet (5 mg total) by mouth 2 (two) times daily.  Marland Kitchen losartan (COZAAR) 50 MG tablet Take 1 tablet by mouth daily.   . metFORMIN (GLUCOPHAGE) 500 MG tablet Take 1 tablet (500 mg total) by mouth 2 (two) times daily with a meal. (Patient taking differently: Take 500 mg by mouth 3 (three) times daily as needed. )  . Multiple Vitamins-Minerals (PRESERVISION AREDS 2+MULTI VIT)  CAPS Take 1 capsule by mouth 2 (two) times daily.  . nitroGLYCERIN (NITROSTAT) 0.4 MG SL tablet Place 0.4 mg under the tongue every 5 (five) minutes as needed.  . Omega-3 Fatty Acids (FISH OIL) 1000 MG CAPS Take 1,000 mg by mouth 2 (two) times daily.   Marland Kitchen omeprazole (PRILOSEC) 20 MG capsule Take 20 mg by mouth daily.  . predniSONE (DELTASONE) 10 MG tablet Take 6 tablets daily for 5 days then 5 tablets daily until you follow with your pulmonologist. Your pulmonologist will advise further doses.  . traZODone (DESYREL) 50 MG tablet Take 75 mg by mouth at bedtime.   . vitamin B-12 (CYANOCOBALAMIN) 1000 MCG tablet Take 1  tablet (1,000 mcg total) by mouth daily.     Allergies:   Ace inhibitors, Hydrocodone, Hydrocodone-acetaminophen, and Sulfamethoxazole-trimethoprim   Social History   Socioeconomic History  . Marital status: Married    Spouse name: Not on file  . Number of children: 2  . Years of education: Not on file  . Highest education level: Not on file  Occupational History  . Occupation: retired  Scientific laboratory technician  . Financial resource strain: Not on file  . Food insecurity    Worry: Not on file    Inability: Not on file  . Transportation needs    Medical: Not on file    Non-medical: Not on file  Tobacco Use  . Smoking status: Never Smoker  . Smokeless tobacco: Never Used  Substance and Sexual Activity  . Alcohol use: Never    Frequency: Never  . Drug use: Never  . Sexual activity: Not on file  Lifestyle  . Physical activity    Days per week: Not on file    Minutes per session: Not on file  . Stress: Not on file  Relationships  . Social Herbalist on phone: Not on file    Gets together: Not on file    Attends religious service: Not on file    Active member of club or organization: Not on file    Attends meetings of clubs or organizations: Not on file    Relationship status: Not on file  Other Topics Concern  . Not on file  Social History Narrative  . Not on file     Family History: The patient's family history includes Clotting disorder in his brother; Heart attack in his sister; Lung disease in his sister; Pancreatic cancer in his sister; Stroke in his father; Tuberculosis in his mother. There is no history of Colon cancer or Esophageal cancer.  ROS:   Please see the history of present illness.    All other systems reviewed and are negative.  EKGs/Labs/Other Studies Reviewed:    The following studies were reviewed today: Again echocardiogram and EKG were unremarkable these were done at [facility] I reviewed them.   Recent Labs: 04/01/2019: B  Natriuretic Peptide 70.0 04/05/2019: Magnesium 2.0 04/19/2019: ALT 28 04/20/2019: BUN 25; Creatinine, Ser 1.03; Hemoglobin 9.4; Platelets 228; Potassium 4.4; Sodium 135  Recent Lipid Panel No results found for: CHOL, TRIG, HDL, CHOLHDL, VLDL, LDLCALC, LDLDIRECT  Physical Exam:    VS:  BP (!) 140/52 (BP Location: Right Arm, Patient Position: Sitting, Cuff Size: Normal)   Pulse 75   Ht 5\' 8"  (1.727 m)   Wt 185 lb (83.9 kg)   SpO2 96%   BMI 28.13 kg/m     Wt Readings from Last 3 Encounters:  04/29/19 185 lb (83.9 kg)  04/28/19  184 lb (83.5 kg)  04/28/19 184 lb (83.5 kg)     GEN: Patient is in no acute distress HEENT: Normal NECK: No JVD; No carotid bruits LYMPHATICS: No lymphadenopathy CARDIAC: Hear sounds regular, 2/6 systolic murmur at the apex. RESPIRATORY:  Clear to auscultation without rales, wheezing or rhonchi  ABDOMEN: Soft, non-tender, non-distended MUSCULOSKELETAL:  No edema; No deformity  SKIN: Warm and dry NEUROLOGIC:  Alert and oriented x 3 PSYCHIATRIC:  Normal affect   Signed, Jenean Lindau, MD  04/29/2019 11:03 AM    [place]

## 2019-05-03 ENCOUNTER — Ambulatory Visit: Payer: Medicare HMO | Admitting: Pulmonary Disease

## 2019-05-03 ENCOUNTER — Ambulatory Visit (INDEPENDENT_AMBULATORY_CARE_PROVIDER_SITE_OTHER): Payer: Medicare HMO | Admitting: Pulmonary Disease

## 2019-05-03 ENCOUNTER — Encounter: Payer: Self-pay | Admitting: Pulmonary Disease

## 2019-05-03 ENCOUNTER — Other Ambulatory Visit: Payer: Self-pay

## 2019-05-03 VITALS — BP 118/78 | HR 72 | Temp 98.0°F | Ht 67.0 in | Wt 183.6 lb

## 2019-05-03 DIAGNOSIS — R768 Other specified abnormal immunological findings in serum: Secondary | ICD-10-CM

## 2019-05-03 DIAGNOSIS — J849 Interstitial pulmonary disease, unspecified: Secondary | ICD-10-CM

## 2019-05-03 DIAGNOSIS — Z8639 Personal history of other endocrine, nutritional and metabolic disease: Secondary | ICD-10-CM | POA: Diagnosis not present

## 2019-05-03 DIAGNOSIS — E663 Overweight: Secondary | ICD-10-CM | POA: Diagnosis not present

## 2019-05-03 DIAGNOSIS — M791 Myalgia, unspecified site: Secondary | ICD-10-CM | POA: Diagnosis not present

## 2019-05-03 DIAGNOSIS — M3320 Polymyositis, organ involvement unspecified: Secondary | ICD-10-CM

## 2019-05-03 DIAGNOSIS — Z148 Genetic carrier of other disease: Secondary | ICD-10-CM | POA: Diagnosis not present

## 2019-05-03 DIAGNOSIS — J84112 Idiopathic pulmonary fibrosis: Secondary | ICD-10-CM | POA: Diagnosis not present

## 2019-05-03 DIAGNOSIS — Z6828 Body mass index (BMI) 28.0-28.9, adult: Secondary | ICD-10-CM | POA: Diagnosis not present

## 2019-05-03 DIAGNOSIS — R509 Fever, unspecified: Secondary | ICD-10-CM | POA: Diagnosis not present

## 2019-05-03 DIAGNOSIS — I776 Arteritis, unspecified: Secondary | ICD-10-CM | POA: Diagnosis not present

## 2019-05-03 LAB — PULMONARY FUNCTION TEST
DL/VA % pred: 122 %
DL/VA: 4.82 ml/min/mmHg/L
DLCO cor % pred: 105 %
DLCO cor: 23.24 ml/min/mmHg
DLCO unc % pred: 85 %
DLCO unc: 18.93 ml/min/mmHg
FEF 25-75 Post: 1.38 L/sec
FEF 25-75 Pre: 2.31 L/sec
FEF2575-%Change-Post: -40 %
FEF2575-%Pred-Post: 83 %
FEF2575-%Pred-Pre: 139 %
FEV1-%Change-Post: -21 %
FEV1-%Pred-Post: 81 %
FEV1-%Pred-Pre: 104 %
FEV1-Post: 2.01 L
FEV1-Pre: 2.57 L
FEV1FVC-%Change-Post: -20 %
FEV1FVC-%Pred-Pre: 111 %
FEV6-%Change-Post: -1 %
FEV6-%Pred-Post: 98 %
FEV6-%Pred-Pre: 99 %
FEV6-Post: 3.19 L
FEV6-Pre: 3.22 L
FEV6FVC-%Pred-Post: 108 %
FEV6FVC-%Pred-Pre: 108 %
FVC-%Change-Post: -1 %
FVC-%Pred-Post: 91 %
FVC-%Pred-Pre: 92 %
FVC-Post: 3.19 L
FVC-Pre: 3.22 L
Post FEV1/FVC ratio: 63 %
Post FEV6/FVC ratio: 100 %
Pre FEV1/FVC ratio: 80 %
Pre FEV6/FVC Ratio: 100 %
RV % pred: 42 %
RV: 1.08 L
TLC % pred: 75 %
TLC: 4.85 L

## 2019-05-03 NOTE — Progress Notes (Signed)
Full PFT performed today. °

## 2019-05-03 NOTE — Patient Instructions (Signed)
I have reviewed imaging which shows scarring and inflammation in the lung This could be related to an immune process I will coordinate with Dr. Trudie Reed and reviewed the results of the test that was sent out today and come up with steps for further treatment Follow back in clinic in 4 weeks

## 2019-05-03 NOTE — Progress Notes (Addendum)
Kerry Matthews    [id number]    1935/09/06  Primary Care Physician:Cox, Elnita Maxwell, MD  Referring Physician: [name], MD [address],  [place] [address]  Chief complaint: Consult for interstitial lung disease, alpha-1 antitrypsin carrier  HPI: 83 year old with pulmonary fibrosis on CT chest. He has complains of joint stiffness, generalized myalgias, low-grade fevers starting July 2020.  The symptoms generally respond to prednisone.  Denies any renal symptoms, difficulty swallowing.  Ruled out for COVID- 19 multiple times.  He has seen Dr. Trudie Reed, Rheumatology today and blood work sent for myositis panel, CK, aldolase, sed rate, complement, ANCA panel, ANA, SPEP, UPEP.  Differential diagnosis at this point is vasculitis , polymyalgia rheumatica, rheumatoid arthritis, myositis  Pets: Has a dog.  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 No mold, hot tub, Jacuzzi, down, humidifier Smoking history: Never smoker Travel history: No significant travel history Relevant family history: 2 sisters died of pulmonary fibrosis of unclear etiology.  1 of the sisters also had COPD  Outpatient Encounter Medications as of 05/03/2019  Medication Sig  . aspirin 81 MG EC tablet Take 81 mg by mouth daily.   . Coenzyme Q10 (CO Q 10) 10 MG CAPS Take 1 capsule by mouth daily.  . furosemide (LASIX) 40 MG tablet Take 20 mg by mouth daily.   Marland Kitchen glipiZIDE (GLUCOTROL) 5 MG tablet Take 1 tablet (5 mg total) by mouth 2 (two) times daily.  Marland Kitchen losartan (COZAAR) 50 MG tablet Take 1 tablet by mouth daily.   . metFORMIN (GLUCOPHAGE) 500 MG tablet Take 1 tablet (500 mg total) by mouth 2 (two) times daily with a meal. (Patient taking differently: Take 500 mg by mouth 3 (three)  times daily as needed. )  . Multiple Vitamins-Minerals (PRESERVISION AREDS 2+MULTI VIT) CAPS Take 1 capsule by mouth 2 (two) times daily.  . nitroGLYCERIN (NITROSTAT) 0.4 MG SL tablet Place 0.4 mg under the tongue every 5 (five) minutes as needed.  . Omega-3 Fatty Acids (FISH OIL) 1000 MG CAPS Take 1,000 mg by mouth 2 (two) times daily.   Marland Kitchen omeprazole (PRILOSEC) 20 MG capsule Take 20 mg by mouth daily.  . predniSONE (DELTASONE) 10 MG tablet Take 6 tablets daily for 5 days then 5 tablets daily until you follow with your pulmonologist. Your pulmonologist will advise further doses.  . traZODone (DESYREL) 50 MG tablet Take 75 mg by mouth at bedtime.   . vitamin B-12 (CYANOCOBALAMIN) 1000 MCG tablet Take 1 tablet (1,000 mcg total) by mouth daily.   No facility-administered encounter medications on file as of 05/03/2019.     Allergies as of 05/03/2019 - Review Complete 05/03/2019  Allergen Reaction Noted  . Ace inhibitors Other (See Comments) 05/03/2018  . Hydrocodone Nausea And Vomiting 12/28/2015  . Hydrocodone-acetaminophen Nausea And Vomiting 05/03/2018  . Sulfamethoxazole-trimethoprim  06/30/2016    Past Medical History:  Diagnosis Date  . Coronary artery disease   . Diabetes ([place])   . Hyperlipidemia   . Hypertension   . Osteoarthritis   . Renal stones   . Skin cancer   . UC (ulcerative colitis) ([place])   . UTI (urinary tract infection)     Past Surgical History:  Procedure Laterality Date  . ANGIOPLASTY    . BACK  SURGERY    . CATARACT EXTRACTION    . COLONOSCOPY  06/16/2005   Mild colitis involving splenic flexure. Colonic polyps, status post polypectomy. Mild pancolonic diverticulitits. Internal hemorrhoids.   . ESOPHAGOGASTRODUODENOSCOPY  04/26/2003   Irregular Z line suggestive of GERD. Mild gastritis status post CLO testing.   Marland Kitchen TRIGGER FINGER RELEASE      Family History  Problem Relation Age of Onset  . Tuberculosis Mother   . Stroke Father   . Pancreatic cancer  Sister   . Heart attack Sister   . Lung disease Sister   . Clotting disorder Brother   . Colon cancer Neg Hx   . Esophageal cancer Neg Hx     Social History   Socioeconomic History  . Marital status: Married    Spouse name: Not on file  . Number of children: 2  . Years of education: Not on file  . Highest education level: Not on file  Occupational History  . Occupation: retired  Scientific laboratory technician  . Financial resource strain: Not on file  . Food insecurity    Worry: Not on file    Inability: Not on file  . Transportation needs    Medical: Not on file    Non-medical: Not on file  Tobacco Use  . Smoking status: Never Smoker  . Smokeless tobacco: Never Used  Substance and Sexual Activity  . Alcohol use: Never    Frequency: Never  . Drug use: Never  . Sexual activity: Not on file  Lifestyle  . Physical activity    Days per week: Not on file    Minutes per session: Not on file  . Stress: Not on file  Relationships  . Social Herbalist on phone: Not on file    Gets together: Not on file    Attends religious service: Not on file    Active member of club or organization: Not on file    Attends meetings of clubs or organizations: Not on file    Relationship status: Not on file  . Intimate partner violence    Fear of current or ex partner: Not on file    Emotionally abused: Not on file    Physically abused: Not on file    Forced sexual activity: Not on file  Other Topics Concern  . Not on file  Social History Narrative  . Not on file    Review of systems: Review of Systems  Constitutional: Negative for fever and chills.  HENT: Negative.   Eyes: Negative for blurred vision.  Respiratory: as per HPI  Cardiovascular: Negative for chest pain and palpitations.  Gastrointestinal: Negative for vomiting, diarrhea, blood per rectum. Genitourinary: Negative for dysuria, urgency, frequency and hematuria.  Musculoskeletal: Negative for myalgias, back pain and joint  pain.  Skin: Negative for itching and rash.  Neurological: Negative for dizziness, tremors, focal weakness, seizures and loss of consciousness.  Endo/Heme/Allergies: Negative for environmental allergies.  Psychiatric/Behavioral: Negative for depression, suicidal ideas and hallucinations.  All other systems reviewed and are negative.  Physical Exam: Blood pressure 118/78, pulse 72, temperature 98 F (36.7 C), temperature source Temporal, height 5\' 7"  (1.702 m), weight 183 lb 9.6 oz (83.3 kg), SpO2 97 %. Gen:      No acute distress HEENT:  EOMI, sclera anicteric Neck:     No masses; no thyromegaly Lungs:    Clear to auscultation bilaterally; normal respiratory effort CV:         Regular rate and  rhythm; no murmurs Abd:      + bowel sounds; soft, non-tender; no palpable masses, no distension Ext:    No edema; adequate peripheral perfusion Skin:      Warm and dry; no rash Neuro: alert and oriented x 3 Psych: normal mood and affect  Data Reviewed: Imaging: CT high-resolution 04/19/2019- patchy groundglass attenuation, septal thickening with reticulation and mild bronchiectasis with basal gradient.  No definitive honeycombing Probable UIP.  I have reviewed the images personally.  PFTs: 05/03/2019 FVC 3.22 [92%], FEV1 2.57 [104%], F/F 80, TLC 4.85 [95%], DLCO 18.93 [85%] Minimal restriction  Labs: 04/02/2019- p-ANCA 1:160, c-ANCA negative, MPO>100, rheumatoid factor 95 CCP negative, Sjogren's antibody negative, SCL 70- negative Jo 1-negative, aldolase 6.3, CK 31  Alpha-1 antitrypsin 04/12/2019- 124, PI MZ  Assessment:  Interstitial lung disease Likely autoimmune mediated ILD versus idiopathic pneumonia with autoimmune features Differential diagnosis is vasculitis, myositis, rheumatoid arthritis He is being evaluated by rheumatology with additional labs sent out. Will await completion of this assessment  Continue close follow-up of interstitial lung disease.  If it is progressive then  will consider lung biopsy and initiation of anti-fibrotics.  Alpha-1 antitrypsin carrier status His A1AT levels are normal with no obstruction on pulmonary function test.  LFTs are normal Continue monitoring.  No need for treatment.  Plan/Recommendations: - Schedule 6-minute walk test - Get records from rheumatology - Follow-up in 2 to 4 weeks.  [name] MD [facility] 05/03/2019, 1:54 PM  CC: CoxElnita Maxwell, MD  Addendum: Received clinic note from [facility] rheumatology, Dr. 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

## 2019-05-04 DIAGNOSIS — Z148 Genetic carrier of other disease: Secondary | ICD-10-CM

## 2019-05-04 HISTORY — DX: Genetic carrier of other disease: Z14.8

## 2019-05-04 NOTE — Addendum Note (Signed)
Addended by: Hildred Alamin I on: 05/04/2019 11:27 AM   Modules accepted: Orders

## 2019-05-04 NOTE — Progress Notes (Signed)
This was discussed with patient at office visit on 05/03/2019 with Dr. Vaughan Browner. Nothing further needed.   Wyn Quaker FNP

## 2019-05-05 NOTE — Discharge Summary (Addendum)
Physician Discharge Summary  Kerry Matthews [id number] DOB: 23-Sep-1935 DOA: 04/19/2019  PCP: Kerry Brome, MD  Admit date: 04/19/2019 Discharge date: 04/21/2019.  Admitted From: Home  Disposition:  Home   Recommendations for Outpatient Follow-up:  1. Follow up with PCP in 1-2 weeks 2. Please obtain BMP/CBC in one week 3. Needs to follow up with pulmonologist and Rheumoathologist    Discharge Condition: Stable.  CODE STATUS: full code Diet recommendation: Heart Healthy   Brief/Interim Summary: 83 year old with past medical history significant for coronary artery disease, hypertension, diabetes and recent hospitalization with new diagnosis of interstitial lung disease with abnormal ANCA/elevated rheumatoid factor at that time presenting with fever and shortness of breath.  Patient relate that he was getting better and the morning of admission he just fell apart.  He was feeling dizzy, weak, sore all over.  Patient was noted to have a temperature of 101.  He was also complaining of worsening shortness of breath, slight worsening cough. 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  He was treated empirically with azithromycin and Levaquin and IV antibiotics during last hospitalization.  He was a started on Solu-Medrol and has significant improvement and discharged home on prednisone.  Myeloperoxidase antibodies were elevated more than 100 and rheumatoid factor was 95.  Patient follow-up with pulmonologist on 8/18 and was scheduled for HR CT a and referred to rheumatology. Patient with fever, hypotension, leukocytosis.  COVID 19-  1-SIRS from rheumatologic flare.  Sepsis was  ruled out./  Probably autoimmune process. Blood culture no growth to date.  Appreciate ID recommendation plan to monitor off of antibiotics. Prior admission with elevated rheumatoid factor  positive myeloperoxidase antibody p-ANCA. He was a started on a stress dose hydrocortisone 50 mg every 6 hours. Receive IV fluids. He will require rheumatology follow-up as an outpatient. resolved with steroids.   2-interstitial lung disease/pneumonitis: CCM consulted. Prior rheumatoid factor positive and myeloperoxidase antibody p-ANCA positive Discharge on prednisone taper dose.  Follow up with pulmonologist   3-AKI: Creatinine baseline 1.1. Patient with a creatinine of 1.4 Received IV fluids. improved.   4-Hypertension: Hold Cozaar due to AKI and hypotension.  5-Diabetes: Hold Glucophage and Glucotrol.  Sliding scale insulin. Restarted on lantus.   Hyponatremia: Improve   Discharge Diagnoses:    Fever   Hypertension   Hyperlipidemia   Diabetes mellitus type 2 in obese (HCC)   Abnormal CT of the chest   AKI (acute kidney injury) (HCC)   Elevated rheumatoid factor   Interstitial pulmonary disease (HCC)   Abnormal ANCA test   Myalgia    Discharge Instructions  Discharge Instructions    Diet - low sodium heart healthy   Complete by: As directed    Increase activity slowly   Complete by: As directed      Allergies as of 04/21/2019      Reactions   Ace Inhibitors Other (See Comments)   Slow heart rate   Hydrocodone Nausea And Vomiting   Hydrocodone-acetaminophen Nausea And Vomiting   Sulfamethoxazole-trimethoprim    Got sicker      Medication List    TAKE these medications   aspirin 81 MG EC tablet Take 81 mg by mouth daily.   Co Q 10 10 MG Caps Take 1 capsule by mouth daily.   Fish Oil 1000 MG Caps Take 1,000 mg by mouth 2 (two) times daily.   furosemide 40 MG tablet Commonly known as: LASIX  Take 20 mg by mouth daily.   glipiZIDE 5 MG tablet Commonly known as: GLUCOTROL Take 1 tablet (5 mg total) by mouth 2 (two) times daily.   losartan 50 MG tablet Commonly known as: COZAAR Take 1 tablet by mouth daily.   metFORMIN 500 MG  tablet Commonly known as: GLUCOPHAGE Take 1 tablet (500 mg total) by mouth 2 (two) times daily with a meal. What changed:   when to take this  reasons to take this   nitroGLYCERIN 0.4 MG SL tablet Commonly known as: NITROSTAT Place 0.4 mg under the tongue every 5 (five) minutes as needed.   omeprazole 20 MG capsule Commonly known as: PRILOSEC Take 20 mg by mouth daily.   predniSONE 10 MG tablet Commonly known as: DELTASONE Take 6 tablets daily for 5 days then 5 tablets daily until you follow with your pulmonologist. Your pulmonologist will advise further doses. What changed: additional instructions   PreserVision AREDS 2+Multi Vit Caps Take 1 capsule by mouth 2 (two) times daily.   traZODone 50 MG tablet Commonly known as: DESYREL Take 75 mg by mouth at bedtime.   vitamin B-12 1000 MCG tablet Commonly known as: CYANOCOBALAMIN Take 1 tablet (1,000 mcg total) by mouth daily.       Allergies  Allergen Reactions  . Ace Inhibitors Other (See Comments)    Slow heart rate  . Hydrocodone Nausea And Vomiting  . Hydrocodone-Acetaminophen Nausea And Vomiting  . Sulfamethoxazole-Trimethoprim     Got sicker    Consultations: CCM  Procedures/Studies: Dg Chest 2 View  Result Date: 04/19/2019 CLINICAL DATA:  Fever.  Weakness.  Dizziness. EXAM: CHEST - 2 VIEW COMPARISON:  CT 04/01/2019.  Chest x-ray 04/01/2019. FINDINGS: Prominence of the anterior mediastinum is stable and consistent with prominent mediastinal fat as noted on prior CT. Hilar structures normal. Stable bilateral mild interstitial prominence consistent chronic interstitial lung disease again noted. Lungs are clear of acute infiltrates r. No pleural effusion or pneumothorax. Cardiomegaly with normal pulmonary vascularity. Degenerative changes thoracic spine. IMPRESSION: No acute cardiopulmonary disease. Electronically Signed   By: Marcello Moores  Register   On: 04/19/2019 13:09   Ct Chest High Resolution  Result Date:  04/19/2019 CLINICAL DATA:  83 year old male with history of fever of unknown origin. Evaluate for interstitial lung disease. EXAM: CT CHEST WITHOUT CONTRAST TECHNIQUE: Multidetector CT imaging of the chest was performed following the standard protocol without intravenous contrast. High resolution imaging of the lungs, as well as inspiratory and expiratory imaging, was performed. COMPARISON:  Chest CT 04/01/2019. FINDINGS: Cardiovascular: Heart size is borderline enlarged. There is no significant pericardial fluid, thickening or pericardial calcification. There is aortic atherosclerosis, as well as atherosclerosis of the great vessels of the mediastinum and the coronary arteries, including calcified atherosclerotic plaque in the left main, left anterior descending, left circumflex and right coronary arteries. Dilatation of the pulmonic trunk (3.9 cm in diameter). Mediastinum/Nodes: No pathologically enlarged mediastinal or hilar lymph nodes. Please note that accurate exclusion of hilar adenopathy is limited on noncontrast CT scans. Esophagus is unremarkable in appearance. No axillary lymphadenopathy. Lungs/Pleura: High-resolution images demonstrate patchy peripheral predominant areas of ground-glass attenuation and septal thickening with some subpleural reticulation and areas of mild cylindrical traction bronchiectasis and peripheral bronchiolectasis. These findings have a definitive craniocaudal gradient. There may be some areas of developing honeycombing in the extreme lung bases, however, this is not yet definitive at this time. Inspiratory and expiratory imaging is unremarkable. No acute consolidative airspace disease. No pleural effusions. Upper Abdomen:  Aortic atherosclerosis. Musculoskeletal: There are no aggressive appearing lytic or blastic lesions noted in the visualized portions of the skeleton. IMPRESSION: 1. The appearance of the lungs is compatible with interstitial lung disease, with a spectrum of  findings considered probable usual interstitial pneumonia (UIP) per current ATS guidelines. 2. Dilatation of the pulmonic trunk (3.9 cm in diameter), concerning for associated pulmonary arterial hypertension. 3. Aortic atherosclerosis, in addition to left main and 3 vessel coronary artery disease. Please note that although the presence of coronary artery calcium documents the presence of coronary artery disease, the severity of this disease and any potential stenosis cannot be assessed on this non-gated CT examination. Assessment for potential risk factor modification, dietary therapy or pharmacologic therapy may be warranted, if clinically indicated. Aortic Atherosclerosis (ICD10-I70.0). Electronically Signed   By: Vinnie Langton M.D.   On: 04/19/2019 16:56     Subjective: He is feeling better, dyspnea improved  Discharge Exam: Vitals:   04/21/19 0311 04/21/19 0838  BP: (!) 157/66 124/71  Pulse:  84  Resp:  (!) 23  Temp: 98.4 F (36.9 C) 98.4 F (36.9 C)  SpO2:  98%     General: Pt is alert, awake, not in acute distress Cardiovascular: RRR, S1/S2 +, no rubs, no gallops Respiratory: CTA bilaterally, no wheezing, no rhonchi Abdominal: Soft, NT, ND, bowel sounds + Extremities: no edema, no cyanosis    The results of significant diagnostics from this hospitalization (including imaging, microbiology, ancillary and laboratory) are listed below for reference.     Microbiology: Recent Results (from the past 240 hour(s))  SARS CORONAVIRUS 2 (TAT 6-24 HRS) Nasopharyngeal Nasopharyngeal Swab     Status: None   Collection Time: 04/29/19  5:03 PM   Specimen: Nasopharyngeal Swab  Result Value Ref Range Status   SARS Coronavirus 2 NEGATIVE NEGATIVE Final    Comment: (NOTE) SARS-CoV-2 target nucleic acids are NOT DETECTED. The SARS-CoV-2 RNA is generally detectable in upper and lower respiratory specimens during the acute phase of infection. Negative results do not preclude SARS-CoV-2  infection, do not rule out co-infections with other pathogens, and should not be used as the sole basis for treatment or other patient management decisions. Negative results must be combined with clinical observations, patient history, and epidemiological information. The expected result is Negative. Fact Sheet for Patients: SugarRoll.be Fact Sheet for Healthcare Providers: https://www.woods-mathews.com/ This test is not yet approved or cleared by the [place] FDA and  has been authorized for detection and/or diagnosis of SARS-CoV-2 by FDA under an Emergency Use Authorization (EUA). This EUA will remain  in effect (meaning this test can be used) for the duration of the COVID-19 declaration under Section 56 4(b)(1) of the Act, 21 U.S.C. section 360bbb-3(b)(1), unless the authorization is terminated or revoked sooner. Performed at [facility], [place] [address] [place], [place] [address]      Labs: BNP (last 3 results) Recent Labs    04/01/19 1417  BNP [id number]   Basic Metabolic Panel: No results for input(s): NA, K, CL, CO2, GLUCOSE, BUN, CREATININE, CALCIUM, MG, PHOS in the last 168 hours. Liver Function Tests: No results for input(s): AST, ALT, ALKPHOS, BILITOT, PROT, ALBUMIN in the last 168 hours. No results for input(s): LIPASE, AMYLASE in the last 168 hours. No results for input(s): AMMONIA in the last 168 hours. CBC: No results for input(s): WBC, NEUTROABS, HGB, HCT, MCV, PLT in the last 168 hours. Cardiac Enzymes: No results for input(s): CKTOTAL, CKMB, CKMBINDEX, TROPONINI in the last  168 hours. BNP: Invalid input(s): POCBNP CBG: No results for input(s): GLUCAP in the last 168 hours. D-Dimer No results for input(s): DDIMER in the last 72 hours. Hgb A1c No results for input(s): HGBA1C in the last 72 hours. Lipid Profile No results for input(s): CHOL, HDL, LDLCALC, TRIG, CHOLHDL, LDLDIRECT in the last 72  hours. Thyroid function studies No results for input(s): TSH, T4TOTAL, T3FREE, THYROIDAB in the last 72 hours.  Invalid input(s): FREET3 Anemia work up No results for input(s): VITAMINB12, FOLATE, FERRITIN, TIBC, IRON, RETICCTPCT in the last 72 hours. Urinalysis    Component Value Date/Time   COLORURINE YELLOW 04/19/2019 1346   APPEARANCEUR CLEAR 04/19/2019 1346   LABSPEC 1.011 04/19/2019 1346   PHURINE 5.0 04/19/2019 1346   GLUCOSEU NEGATIVE 04/19/2019 1346   HGBUR NEGATIVE 04/19/2019 1346   BILIRUBINUR NEGATIVE 04/19/2019 1346   KETONESUR NEGATIVE 04/19/2019 1346   PROTEINUR NEGATIVE 04/19/2019 1346   NITRITE NEGATIVE 04/19/2019 1346   LEUKOCYTESUR NEGATIVE 04/19/2019 1346   Sepsis Labs Invalid input(s): PROCALCITONIN,  WBC,  LACTICIDVEN Microbiology Recent Results (from the past 240 hour(s))  SARS CORONAVIRUS 2 (TAT 6-24 HRS) Nasopharyngeal Nasopharyngeal Swab     Status: None   Collection Time: 04/29/19  5:03 PM   Specimen: Nasopharyngeal Swab  Result Value Ref Range Status   SARS Coronavirus 2 NEGATIVE NEGATIVE Final    Comment: (NOTE) SARS-CoV-2 target nucleic acids are NOT DETECTED. The SARS-CoV-2 RNA is generally detectable in upper and lower respiratory specimens during the acute phase of infection. Negative results do not preclude SARS-CoV-2 infection, do not rule out co-infections with other pathogens, and should not be used as the sole basis for treatment or other patient management decisions. Negative results must be combined with clinical observations, patient history, and epidemiological information. The expected result is Negative. Fact Sheet for Patients: SugarRoll.be Fact Sheet for Healthcare Providers: https://www.woods-mathews.com/ This test is not yet approved or cleared by the [place] FDA and  has been authorized for detection and/or diagnosis of SARS-CoV-2 by FDA under an Emergency Use Authorization  (EUA). This EUA will remain  in effect (meaning this test can be used) for the duration of the COVID-19 declaration under Section 56 4(b)(1) of the Act, 21 U.S.C. section 360bbb-3(b)(1), unless the authorization is terminated or revoked sooner. Performed at [facility], [place] [address], [place], [place] [address]      Time coordinating discharge: 40 minutes  SIGNED:   Elmarie Shiley, MD  [facility]

## 2019-05-25 DIAGNOSIS — R809 Proteinuria, unspecified: Secondary | ICD-10-CM | POA: Diagnosis not present

## 2019-05-25 DIAGNOSIS — R509 Fever, unspecified: Secondary | ICD-10-CM | POA: Diagnosis not present

## 2019-05-25 DIAGNOSIS — M317 Microscopic polyangiitis: Secondary | ICD-10-CM | POA: Diagnosis not present

## 2019-05-25 DIAGNOSIS — R768 Other specified abnormal immunological findings in serum: Secondary | ICD-10-CM | POA: Diagnosis not present

## 2019-05-25 DIAGNOSIS — D8989 Other specified disorders involving the immune mechanism, not elsewhere classified: Secondary | ICD-10-CM | POA: Diagnosis not present

## 2019-05-25 DIAGNOSIS — M791 Myalgia, unspecified site: Secondary | ICD-10-CM | POA: Diagnosis not present

## 2019-05-25 DIAGNOSIS — J84112 Idiopathic pulmonary fibrosis: Secondary | ICD-10-CM | POA: Diagnosis not present

## 2019-05-31 DIAGNOSIS — M317 Microscopic polyangiitis: Secondary | ICD-10-CM | POA: Diagnosis not present

## 2019-05-31 DIAGNOSIS — E1121 Type 2 diabetes mellitus with diabetic nephropathy: Secondary | ICD-10-CM | POA: Diagnosis not present

## 2019-06-01 ENCOUNTER — Ambulatory Visit: Payer: Medicare HMO | Admitting: Pulmonary Disease

## 2019-06-06 DIAGNOSIS — N3000 Acute cystitis without hematuria: Secondary | ICD-10-CM | POA: Diagnosis not present

## 2019-06-06 DIAGNOSIS — R220 Localized swelling, mass and lump, head: Secondary | ICD-10-CM | POA: Diagnosis not present

## 2019-06-06 DIAGNOSIS — E1165 Type 2 diabetes mellitus with hyperglycemia: Secondary | ICD-10-CM | POA: Diagnosis not present

## 2019-06-07 DIAGNOSIS — N3001 Acute cystitis with hematuria: Secondary | ICD-10-CM | POA: Diagnosis not present

## 2019-06-15 DIAGNOSIS — M317 Microscopic polyangiitis: Secondary | ICD-10-CM | POA: Diagnosis not present

## 2019-06-22 DIAGNOSIS — M317 Microscopic polyangiitis: Secondary | ICD-10-CM | POA: Diagnosis not present

## 2019-06-28 DIAGNOSIS — R768 Other specified abnormal immunological findings in serum: Secondary | ICD-10-CM | POA: Diagnosis not present

## 2019-06-28 DIAGNOSIS — D8989 Other specified disorders involving the immune mechanism, not elsewhere classified: Secondary | ICD-10-CM | POA: Diagnosis not present

## 2019-06-28 DIAGNOSIS — R809 Proteinuria, unspecified: Secondary | ICD-10-CM | POA: Diagnosis not present

## 2019-06-28 DIAGNOSIS — Z79899 Other long term (current) drug therapy: Secondary | ICD-10-CM | POA: Diagnosis not present

## 2019-06-28 DIAGNOSIS — M317 Microscopic polyangiitis: Secondary | ICD-10-CM | POA: Diagnosis not present

## 2019-06-28 DIAGNOSIS — M791 Myalgia, unspecified site: Secondary | ICD-10-CM | POA: Diagnosis not present

## 2019-06-28 DIAGNOSIS — J84112 Idiopathic pulmonary fibrosis: Secondary | ICD-10-CM | POA: Diagnosis not present

## 2019-06-29 DIAGNOSIS — M317 Microscopic polyangiitis: Secondary | ICD-10-CM | POA: Diagnosis not present

## 2019-06-30 DIAGNOSIS — R599 Enlarged lymph nodes, unspecified: Secondary | ICD-10-CM | POA: Diagnosis not present

## 2019-07-01 DIAGNOSIS — R221 Localized swelling, mass and lump, neck: Secondary | ICD-10-CM | POA: Diagnosis not present

## 2019-07-01 DIAGNOSIS — J342 Deviated nasal septum: Secondary | ICD-10-CM | POA: Diagnosis not present

## 2019-07-01 DIAGNOSIS — J310 Chronic rhinitis: Secondary | ICD-10-CM | POA: Diagnosis not present

## 2019-07-01 DIAGNOSIS — Z79899 Other long term (current) drug therapy: Secondary | ICD-10-CM | POA: Diagnosis not present

## 2019-07-01 DIAGNOSIS — D170 Benign lipomatous neoplasm of skin and subcutaneous tissue of head, face and neck: Secondary | ICD-10-CM | POA: Diagnosis not present

## 2019-07-04 ENCOUNTER — Ambulatory Visit: Payer: Medicare HMO | Admitting: Pulmonary Disease

## 2019-07-05 ENCOUNTER — Ambulatory Visit: Payer: Medicare HMO | Admitting: Pulmonary Disease

## 2019-07-06 DIAGNOSIS — M317 Microscopic polyangiitis: Secondary | ICD-10-CM | POA: Diagnosis not present

## 2019-07-08 DIAGNOSIS — K115 Sialolithiasis: Secondary | ICD-10-CM | POA: Diagnosis not present

## 2019-07-08 DIAGNOSIS — I6521 Occlusion and stenosis of right carotid artery: Secondary | ICD-10-CM | POA: Diagnosis not present

## 2019-07-08 DIAGNOSIS — R221 Localized swelling, mass and lump, neck: Secondary | ICD-10-CM | POA: Diagnosis not present

## 2019-07-08 DIAGNOSIS — M47812 Spondylosis without myelopathy or radiculopathy, cervical region: Secondary | ICD-10-CM | POA: Diagnosis not present

## 2019-07-08 DIAGNOSIS — J3489 Other specified disorders of nose and nasal sinuses: Secondary | ICD-10-CM | POA: Diagnosis not present

## 2019-07-14 DIAGNOSIS — E041 Nontoxic single thyroid nodule: Secondary | ICD-10-CM | POA: Diagnosis not present

## 2019-07-14 DIAGNOSIS — R221 Localized swelling, mass and lump, neck: Secondary | ICD-10-CM | POA: Diagnosis not present

## 2019-07-14 DIAGNOSIS — D170 Benign lipomatous neoplasm of skin and subcutaneous tissue of head, face and neck: Secondary | ICD-10-CM | POA: Diagnosis not present

## 2019-07-18 DIAGNOSIS — E1121 Type 2 diabetes mellitus with diabetic nephropathy: Secondary | ICD-10-CM | POA: Diagnosis not present

## 2019-07-27 DIAGNOSIS — Z79899 Other long term (current) drug therapy: Secondary | ICD-10-CM | POA: Diagnosis not present

## 2019-07-27 DIAGNOSIS — J84112 Idiopathic pulmonary fibrosis: Secondary | ICD-10-CM | POA: Diagnosis not present

## 2019-07-27 DIAGNOSIS — M317 Microscopic polyangiitis: Secondary | ICD-10-CM | POA: Diagnosis not present

## 2019-07-27 DIAGNOSIS — R768 Other specified abnormal immunological findings in serum: Secondary | ICD-10-CM | POA: Diagnosis not present

## 2019-07-27 DIAGNOSIS — R809 Proteinuria, unspecified: Secondary | ICD-10-CM | POA: Diagnosis not present

## 2019-07-29 DIAGNOSIS — E041 Nontoxic single thyroid nodule: Secondary | ICD-10-CM | POA: Diagnosis not present

## 2019-08-01 ENCOUNTER — Ambulatory Visit: Payer: Medicare HMO | Admitting: Pulmonary Disease

## 2019-08-03 DIAGNOSIS — N390 Urinary tract infection, site not specified: Secondary | ICD-10-CM | POA: Diagnosis not present

## 2019-08-03 DIAGNOSIS — I129 Hypertensive chronic kidney disease with stage 1 through stage 4 chronic kidney disease, or unspecified chronic kidney disease: Secondary | ICD-10-CM | POA: Diagnosis not present

## 2019-08-03 DIAGNOSIS — E041 Nontoxic single thyroid nodule: Secondary | ICD-10-CM | POA: Diagnosis not present

## 2019-08-03 DIAGNOSIS — N1832 Chronic kidney disease, stage 3b: Secondary | ICD-10-CM | POA: Diagnosis not present

## 2019-08-03 DIAGNOSIS — I776 Arteritis, unspecified: Secondary | ICD-10-CM | POA: Diagnosis not present

## 2019-08-22 DIAGNOSIS — M25562 Pain in left knee: Secondary | ICD-10-CM | POA: Diagnosis not present

## 2019-09-12 DIAGNOSIS — M25562 Pain in left knee: Secondary | ICD-10-CM | POA: Diagnosis not present

## 2019-09-26 ENCOUNTER — Other Ambulatory Visit: Payer: Self-pay | Admitting: Family Medicine

## 2019-09-28 ENCOUNTER — Telehealth: Payer: Self-pay | Admitting: Pulmonary Disease

## 2019-09-28 NOTE — Telephone Encounter (Signed)
Pt is scheduled for CT 2/19 and pt is wanting to know if this is still needed. Dr. Vaughan Browner, please advise.

## 2019-09-29 ENCOUNTER — Other Ambulatory Visit: Payer: Self-pay

## 2019-09-29 DIAGNOSIS — I2510 Atherosclerotic heart disease of native coronary artery without angina pectoris: Secondary | ICD-10-CM

## 2019-09-29 MED ORDER — FUROSEMIDE 40 MG PO TABS: ORAL_TABLET | ORAL | 0 refills | Status: DC

## 2019-09-29 NOTE — Telephone Encounter (Signed)
Yes.  Keep that CT appointment to monitor interstitial lung disease

## 2019-09-29 NOTE — Telephone Encounter (Signed)
Called and spoke with pt letting him know that Dr. Vaughan Browner said to keep upcoming CT appt and pt verbalized understanding. Nothing further needed.

## 2019-10-02 ENCOUNTER — Other Ambulatory Visit: Payer: Self-pay | Admitting: Family Medicine

## 2019-10-04 DIAGNOSIS — R809 Proteinuria, unspecified: Secondary | ICD-10-CM | POA: Diagnosis not present

## 2019-10-04 DIAGNOSIS — E663 Overweight: Secondary | ICD-10-CM | POA: Diagnosis not present

## 2019-10-04 DIAGNOSIS — J84112 Idiopathic pulmonary fibrosis: Secondary | ICD-10-CM | POA: Diagnosis not present

## 2019-10-04 DIAGNOSIS — Z6829 Body mass index (BMI) 29.0-29.9, adult: Secondary | ICD-10-CM | POA: Diagnosis not present

## 2019-10-04 DIAGNOSIS — Z79899 Other long term (current) drug therapy: Secondary | ICD-10-CM | POA: Diagnosis not present

## 2019-10-04 DIAGNOSIS — M317 Microscopic polyangiitis: Secondary | ICD-10-CM | POA: Diagnosis not present

## 2019-10-04 DIAGNOSIS — R768 Other specified abnormal immunological findings in serum: Secondary | ICD-10-CM | POA: Diagnosis not present

## 2019-10-06 ENCOUNTER — Telehealth: Payer: Self-pay | Admitting: Pulmonary Disease

## 2019-10-06 NOTE — Telephone Encounter (Signed)
Per phone note 09/28/2019 Dr Vaughan Browner had stated- yes keep appt for CT to monitor his ILD  Spoke with the pt's spouse and notified her of this and she verbalized understanding  Nothing further needed

## 2019-10-14 ENCOUNTER — Ambulatory Visit (INDEPENDENT_AMBULATORY_CARE_PROVIDER_SITE_OTHER)
Admission: RE | Admit: 2019-10-14 | Discharge: 2019-10-14 | Disposition: A | Discharge status: Home / Self Care | Payer: Medicare HMO | Source: Ambulatory Visit | Attending: Pulmonary Disease | Admitting: Pulmonary Disease

## 2019-10-14 ENCOUNTER — Other Ambulatory Visit: Payer: Self-pay

## 2019-10-14 DIAGNOSIS — R768 Other specified abnormal immunological findings in serum: Secondary | ICD-10-CM | POA: Diagnosis not present

## 2019-10-14 DIAGNOSIS — Z8639 Personal history of other endocrine, nutritional and metabolic disease: Secondary | ICD-10-CM

## 2019-10-14 DIAGNOSIS — I700 Atherosclerosis of aorta: Secondary | ICD-10-CM | POA: Diagnosis not present

## 2019-10-14 DIAGNOSIS — M3320 Polymyositis, organ involvement unspecified: Secondary | ICD-10-CM | POA: Diagnosis not present

## 2019-10-14 DIAGNOSIS — J849 Interstitial pulmonary disease, unspecified: Secondary | ICD-10-CM | POA: Diagnosis not present

## 2019-10-20 ENCOUNTER — Other Ambulatory Visit: Payer: Self-pay

## 2019-10-20 ENCOUNTER — Encounter: Payer: Self-pay | Admitting: Pulmonary Disease

## 2019-10-20 ENCOUNTER — Ambulatory Visit: Payer: Medicare HMO | Admitting: Pulmonary Disease

## 2019-10-20 VITALS — BP 120/70 | HR 64 | Temp 98.1°F | Ht 67.0 in | Wt 192.4 lb

## 2019-10-20 DIAGNOSIS — J849 Interstitial pulmonary disease, unspecified: Secondary | ICD-10-CM

## 2019-10-20 NOTE — Progress Notes (Signed)
Kerry Matthews    VR:[id number]    1935/11/18  Primary Care Physician:Cox, Elnita Maxwell, MD  Referring Physician: [name], MD [address] [place],  [place] [address]  Chief complaint: Follow up for CTD interstitial lung disease, alpha-1 antitrypsin carrier  HPI: 84 year old with pulmonary fibrosis on CT chest. He has complains of joint stiffness, generalized myalgias, low-grade fevers starting July 2020.  The symptoms generally respond to prednisone.  Denies any renal symptoms, difficulty swallowing.  Ruled out for COVID- 19 multiple times.  He has seen Dr. Trudie Reed, Rheumatology today and blood work sent for myositis panel, CK, aldolase, sed rate, complement, ANCA panel, ANA, SPEP, UPEP.  Differential diagnosis at this point is vasculitis , polymyalgia rheumatica, rheumatoid arthritis, myositis  Pets: Has a dog.  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 No mold, hot tub, Jacuzzi, down, humidifier Smoking history: Never smoker Travel history: No significant travel history Relevant family history: 2 sisters died of pulmonary fibrosis of unclear etiology.  1 of the sisters also had COPD  Interim history:  Received clinic note from [facility] rheumatology, Dr. 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  He has started Rituxan and received 4 treatment doses so far He was initially on atovaquone prophylaxis for PJP which was  stopped earlier this month.  Has he has also been tapered off prednisone.  States that overall he is doing much better with improvement in myalgias, joint stiffness. Denies any dyspnea, fever, chills  Outpatient Encounter Medications as of 10/20/2019  Medication Sig  . aspirin 81 MG EC tablet Take 81 mg by mouth daily.   . furosemide (LASIX) 40 MG tablet Take 1/2 (one-half) tablet by mouth once daily  . glipiZIDE (GLUCOTROL) 5 MG tablet Take 1 tablet (5 mg total) by mouth 2 (two) times daily.  Marland Kitchen losartan (COZAAR) 50 MG tablet TAKE 1 TABLET EVERY DAY  . Multiple Vitamins-Minerals (PRESERVISION AREDS 2+MULTI VIT) CAPS Take 1 capsule by mouth 2 (two) times daily.  . nitroGLYCERIN (NITROSTAT) 0.4 MG SL tablet Place 0.4 mg under the tongue every 5 (five) minutes as needed.  . Omega-3 Fatty Acids (FISH OIL) 1000 MG CAPS Take 1,000 mg by mouth 2 (two) times daily.   Marland Kitchen omeprazole (PRILOSEC) 20 MG capsule Take 20 mg by mouth daily.  . vitamin B-12 (CYANOCOBALAMIN) 1000 MCG tablet Take 1 tablet (1,000 mcg total) by mouth daily.  . [DISCONTINUED] Coenzyme Q10 (CO Q 10) 10 MG CAPS Take 1 capsule by mouth daily.  . [DISCONTINUED] metFORMIN (GLUCOPHAGE) 500 MG tablet Take 1 tablet (500 mg total) by mouth 2 (two) times daily with a meal. (Patient taking differently: Take 500 mg by mouth 3 (three) times daily as needed. )  . [DISCONTINUED] predniSONE (DELTASONE) 10 MG tablet Take 6 tablets daily for 5 days then 5 tablets daily until you follow with your pulmonologist. Your pulmonologist will advise further doses.  . [DISCONTINUED] traZODone (DESYREL) 50 MG tablet Take 75 mg by mouth at bedtime.    No facility-administered  encounter medications on file as of 10/20/2019.   Physical Exam: Blood pressure 120/70, pulse 64, temperature 98.1 F (36.7 C), temperature source Temporal, height 5\' 7"  (1.702 m), weight 192 lb 6.4 oz (87.3 kg), SpO2 96 %. Gen:      No acute distress HEENT:  EOMI, sclera anicteric Neck:      No masses; no thyromegaly Lungs:    Bibasal crackles CV:         Regular rate and rhythm; no murmurs Abd:      + bowel sounds; soft, non-tender; no palpable masses, no distension Ext:    No edema; adequate peripheral perfusion Skin:      Warm and dry; no rash Neuro: alert and oriented x 3 Psych: normal mood and affect  Data Reviewed: Imaging: CT high-resolution 04/19/2019- patchy groundglass attenuation, septal thickening with reticulation and mild bronchiectasis with basal gradient.  No definitive honeycombing Probable UIP.   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 Trudie Reed.  If there is progression of pulmonary fibrosis then we can consider initiation of Ofev  Alpha-1 antitrypsin carrier status His A1AT levels are normal with no obstruction on pulmonary function test.  LFTs are normal Continue monitoring.  No need for treatment.  Plan/Recommendations: -Spirometry, diffusion capacity, 6-minute walk test in 6 months  [name] MD [facility] 10/20/2019, 9:21 AM  CC: [name], MD

## 2019-10-20 NOTE — Progress Notes (Signed)
Results reviewed with patient in office visit today with Dr. Vaughan Browner on 10/20/2019.  Nothing further needed.

## 2019-10-20 NOTE — Patient Instructions (Signed)
I am glad you are doing better with regard to your symptoms Your CT scan shows stable interstitial lung disease which is good news Continue treatment with Dr. Trudie Reed We will continue monitoring you with spirometry, diffusion capacity and 6-minute walk test in 6 months time  Follow-up in clinic in 6 months after this test

## 2019-11-29 ENCOUNTER — Encounter: Payer: Self-pay | Admitting: Family Medicine

## 2019-11-29 NOTE — Progress Notes (Signed)
Established Patient Office Visit  Subjective:  Patient ID: Kerry Matthews, male    DOB: 07-18-1936  Age: 84 y.o. MRN: [id number]  CC:  Chief Complaint  Patient presents with  . Knee Pain    Left knee- sprained (unsure exactly how)    HPI Kerry Matthews presents for left knee pain for 1 week. No known injury. No locking or popping.  He does report doing more yard work and going on his tractor.  I have given him a steroid injection which significantly improved and in fact resolved his pain for.  Of approximately 6 months.  He is requesting another injection today.  I did previously do x-rays so I do not feel these are needed today.  He is unable to take NSAIDs due to his other chronic medical issues.  Patient is scheduled for chronic follow-up in approximately 2 weeks however with the numbness in his hands that he is experiencing bilaterally I do feel it would be worth while to do some blood work.  He denies any numbness or tingling in his feet.  His sugars have been well controlled ranging from 100-120.  Past Medical History:  Diagnosis Date  . Bradycardia   . Coronary artery disease   . Diabetes ([place])   . GAD (generalized anxiety disorder)   . GERD (gastroesophageal reflux disease)   . Hyperlipidemia   . Hypertension   . Osteoarthritis   . Other emphysema ([place])   . Primary insomnia   . Renal stones   . Skin cancer   . UC (ulcerative colitis) ([place])   . UTI (urinary tract infection)     Past Surgical History:  Procedure Laterality Date  . ANGIOPLASTY    . BACK SURGERY    . CATARACT EXTRACTION    . COLONOSCOPY  06/16/2005   Mild colitis involving splenic flexure. Colonic polyps, status post polypectomy. Mild pancolonic diverticulitits. Internal hemorrhoids.   . ESOPHAGOGASTRODUODENOSCOPY  04/26/2003   Irregular Z line suggestive of GERD. Mild gastritis status post CLO testing.   Marland Kitchen TRIGGER FINGER RELEASE      Family History  Problem Relation Age of Onset  . Tuberculosis  Mother   . Stroke Father   . Pancreatic cancer Sister   . Heart attack Sister   . Lung disease Sister   . Clotting disorder Brother   . Colon cancer Neg Hx   . Esophageal cancer Neg Hx     Social History   Socioeconomic History  . Marital status: Married    Spouse name: Not on file  . Number of children: 2  . Years of education: Not on file  . Highest education level: Not on file  Occupational History  . Occupation: retired  Tobacco Use  . Smoking status: Never Smoker  . Smokeless tobacco: Never Used  Substance and Sexual Activity  . Alcohol use: Never  . Drug use: Never  . Sexual activity: Not on file  Other Topics Concern  . Not on file  Social History Narrative  . Not on file   Social Determinants of Health   Financial Resource Strain:   . Difficulty of Paying Living Expenses:   Food Insecurity:   . Worried About Charity fundraiser in the Last Year:   . Arboriculturist in the Last Year:   Transportation Needs:   . Film/video editor (Medical):   Marland Kitchen Lack of Transportation (Non-Medical):   Physical Activity:   . Days of Exercise per Week:   .  Minutes of Exercise per Session:   Stress:   . Feeling of Stress :   Social Connections:   . Frequency of Communication with Friends and Family:   . Frequency of Social Gatherings with Friends and Family:   . Attends Religious Services:   . Active Member of Clubs or Organizations:   . Attends Archivist Meetings:   Marland Kitchen Marital Status:   Intimate Partner Violence:   . Fear of Current or Ex-Partner:   . Emotionally Abused:   Marland Kitchen Physically Abused:   . Sexually Abused:     Outpatient Medications Prior to Visit  Medication Sig Dispense Refill  . aspirin 81 MG EC tablet Take 81 mg by mouth daily.     . furosemide (LASIX) 40 MG tablet Take 1/2 (one-half) tablet by mouth once daily 45 tablet 0  . glipiZIDE (GLUCOTROL) 5 MG tablet Take 1 tablet (5 mg total) by mouth 2 (two) times daily. 60 tablet 0  . losartan  (COZAAR) 50 MG tablet TAKE 1 TABLET EVERY DAY 90 tablet 1  . Multiple Vitamins-Minerals (PRESERVISION AREDS 2+MULTI VIT) CAPS Take 1 capsule by mouth 2 (two) times daily.    . nitroGLYCERIN (NITROSTAT) 0.4 MG SL tablet Place 0.4 mg under the tongue every 5 (five) minutes as needed.    . Omega-3 Fatty Acids (FISH OIL) 1000 MG CAPS Take 1,000 mg by mouth 2 (two) times daily.     Marland Kitchen omeprazole (PRILOSEC) 20 MG capsule Take 20 mg by mouth daily.    . vitamin B-12 (CYANOCOBALAMIN) 1000 MCG tablet Take 1 tablet (1,000 mcg total) by mouth daily. 30 tablet 0   No facility-administered medications prior to visit.    Allergies  Allergen Reactions  . Ace Inhibitors Other (See Comments)    Slow heart rate  . Hydrocodone Nausea And Vomiting  . Hydrocodone-Acetaminophen Nausea And Vomiting  . Sulfamethoxazole-Trimethoprim     Got sicker    ROS Review of Systems  Constitutional: Negative for chills, diaphoresis, fatigue and fever.  HENT: Negative for congestion, ear pain and sore throat.   Respiratory: Negative for cough and shortness of breath.   Cardiovascular: Negative for chest pain and leg swelling.  Gastrointestinal: Negative for abdominal pain, constipation, diarrhea, nausea and vomiting.  Genitourinary: Negative for dysuria and urgency.  Musculoskeletal: Positive for arthralgias (Left knee pain). Negative for myalgias.  Neurological: Positive for numbness. Negative for dizziness and headaches.       Patient has had persistent numbness in bilateral hands problems functioning.  Denies numbness in his arms.  It has affected his entire hands.  Psychiatric/Behavioral: Negative for dysphoric mood.      Objective:    Physical Exam  Constitutional: He appears well-developed and well-nourished.  Cardiovascular: Normal rate, regular rhythm and normal heart sounds.  Pulmonary/Chest: Effort normal and breath sounds normal.  Musculoskeletal:        General: Tenderness present. No deformity or  edema.     Comments: Left knee is tender to palpation inferiorly. Positive patellar apprehension.  Positive McMurray's sign.  Right knee is normal.  No ligament laxity noted.  Patient has a significant antalgic gait favoring his left leg.  It does improve after he has walked 15 to 20 feet as evidenced in the office.  Neurological: He is alert.  Psychiatric: He has a normal mood and affect. His behavior is normal.  Decreased sensation in bilateral hands.  BP 138/78 (BP Location: Right Arm, Patient Position: Sitting)   Pulse 71  Temp (!) 97.4 F (36.3 C) (Temporal)   Ht 5\' 7"  (1.702 m)   Wt 189 lb 12.8 oz (86.1 kg)   SpO2 98%   BMI 29.73 kg/m  Wt Readings from Last 3 Encounters:  11/30/19 189 lb 12.8 oz (86.1 kg)  10/20/19 192 lb 6.4 oz (87.3 kg)  05/03/19 183 lb 9.6 oz (83.3 kg)     Health Maintenance Due  Topic Date Due  . FOOT EXAM  Never done  . OPHTHALMOLOGY EXAM  Never done  . TETANUS/TDAP  Never done  . PNA vac Low Risk Adult (1 of 2 - PCV13) Never done  . HEMOGLOBIN A1C  10/04/2019    There are no preventive care reminders to display for this patient.  No results found for: TSH Lab Results  Component Value Date   WBC 11.5 (H) 04/20/2019   HGB 9.4 (L) 04/20/2019   HCT 28.7 (L) 04/20/2019   MCV 89.1 04/20/2019   PLT 228 04/20/2019   Lab Results  Component Value Date   NA 135 04/20/2019   K 4.4 04/20/2019   CO2 20 (L) 04/20/2019   GLUCOSE 190 (H) 04/20/2019   BUN 25 (H) 04/20/2019   CREATININE 1.03 04/20/2019   BILITOT 0.9 04/19/2019   ALKPHOS 93 04/19/2019   AST 18 04/19/2019   ALT 28 04/19/2019   PROT 5.5 (L) 04/19/2019   ALBUMIN 2.4 (L) 04/19/2019   CALCIUM 7.5 (L) 04/20/2019   ANIONGAP 8 04/20/2019   No results found for: CHOL No results found for: HDL No results found for: LDLCALC No results found for: TRIG No results found for: CHOLHDL Lab Results  Component Value Date   HGBA1C 6.2 (H) 04/03/2019      Assessment & Plan:  1. Acute pain  of left knee Risks of knee injection were discussed which include fever, infection, increased pain, bleeding, and elevation of his sugars over the next few days.  Patient understands and wishes to proceed. After consent was obtained, using sterile technique the patient was prepped  The joint was entered inferior and laterally.  No fluid was withdrawn and the was then injected with Kenalog 80 mg and 5 ml plain Lidocaine was then injected and the needle withdrawn.  The procedure was well tolerated.  The patient is asked to continue to rest the joint for a few more days before resuming regular activities.  It may be more painful for the first 1-2 days.  Watch for fever, or increased swelling or persistent pain in the joint. Call or return to clinic prn if such symptoms occur or there is failure to improve as anticipated.  2. Paresthesias in left hand Check labs -TSH, B12 and Folate. Consider cervical x-ray if no etiology is found.  I will review his records as the patient has been on some unusual medications due to his polymyositis and microscopic polyangiitis. 3. Paresthesias in right hand Check labs -TSH, B12 and Folate. Consider cervical x-ray if no etiology is found.  I will review his records as the patient has been on some unusual medications due to his polymyositis and microscopic polyangiitis.  4. Mixed hyperlipidemia Low-fat diet.  Keep follow-up appointment in 2 to 3 weeks. - Lipid panel  5. Essential hypertension Well-controlled.  Keep follow-up appointment in 2-3 weeks. - CBC with Differential/Platelet - Comprehensive metabolic panel - TSH  6. Dyslipidemia associated with type 2 diabetes mellitus ([place]) Keep follow-up in 2 weeks.  Cautioned patient that his sugars would likely increase up to possibly  200 due to the steroid shot. - Hemoglobin A1c  Patient is returning in the morning for his lab work fasting.   Follow-up: No follow-ups on file.    [name], MD

## 2019-11-30 ENCOUNTER — Other Ambulatory Visit: Payer: Self-pay

## 2019-11-30 ENCOUNTER — Ambulatory Visit (INDEPENDENT_AMBULATORY_CARE_PROVIDER_SITE_OTHER): Payer: Medicare HMO | Admitting: Family Medicine

## 2019-11-30 ENCOUNTER — Encounter: Payer: Self-pay | Admitting: Family Medicine

## 2019-11-30 VITALS — BP 138/78 | HR 71 | Temp 97.4°F | Ht 67.0 in | Wt 189.8 lb

## 2019-11-30 DIAGNOSIS — R202 Paresthesia of skin: Secondary | ICD-10-CM | POA: Diagnosis not present

## 2019-11-30 DIAGNOSIS — I10 Essential (primary) hypertension: Secondary | ICD-10-CM

## 2019-11-30 DIAGNOSIS — E785 Hyperlipidemia, unspecified: Secondary | ICD-10-CM | POA: Diagnosis not present

## 2019-11-30 DIAGNOSIS — E782 Mixed hyperlipidemia: Secondary | ICD-10-CM

## 2019-11-30 DIAGNOSIS — E1169 Type 2 diabetes mellitus with other specified complication: Secondary | ICD-10-CM | POA: Diagnosis not present

## 2019-11-30 DIAGNOSIS — M25562 Pain in left knee: Secondary | ICD-10-CM | POA: Diagnosis not present

## 2019-11-30 HISTORY — DX: Pain in left knee: M25.562

## 2019-11-30 HISTORY — DX: Paresthesia of skin: R20.2

## 2019-11-30 MED ORDER — TRIAMCINOLONE ACETONIDE 40 MG/ML IJ SUSP: 80.0000 mg | Freq: Once | INTRAMUSCULAR | Status: DC

## 2019-12-01 ENCOUNTER — Other Ambulatory Visit: Payer: Medicare HMO

## 2019-12-01 DIAGNOSIS — I10 Essential (primary) hypertension: Secondary | ICD-10-CM | POA: Diagnosis not present

## 2019-12-01 DIAGNOSIS — R202 Paresthesia of skin: Secondary | ICD-10-CM | POA: Diagnosis not present

## 2019-12-01 DIAGNOSIS — E785 Hyperlipidemia, unspecified: Secondary | ICD-10-CM | POA: Diagnosis not present

## 2019-12-01 DIAGNOSIS — E782 Mixed hyperlipidemia: Secondary | ICD-10-CM | POA: Diagnosis not present

## 2019-12-01 DIAGNOSIS — E1169 Type 2 diabetes mellitus with other specified complication: Secondary | ICD-10-CM | POA: Diagnosis not present

## 2019-12-02 LAB — COMPREHENSIVE METABOLIC PANEL
ALT: 9 IU/L (ref 0–44)
AST: 14 IU/L (ref 0–40)
Albumin/Globulin Ratio: 1.6 (ref 1.2–2.2)
Albumin: 4.3 g/dL (ref 3.6–4.6)
Alkaline Phosphatase: 116 IU/L (ref 39–117)
BUN/Creatinine Ratio: 15 (ref 10–24)
BUN: 19 mg/dL (ref 8–27)
Bilirubin Total: 0.8 mg/dL (ref 0.0–1.2)
CO2: 24 mmol/L (ref 20–29)
Calcium: 9.9 mg/dL (ref 8.6–10.2)
Chloride: 98 mmol/L (ref 96–106)
Creatinine, Ser: 1.24 mg/dL (ref 0.76–1.27)
GFR calc Af Amer: 62 mL/min/{1.73_m2} (ref >59)
GFR calc non Af Amer: 54 mL/min/{1.73_m2} — ABNORMAL LOW (ref >59)
Globulin, Total: 2.7 g/dL (ref 1.5–4.5)
Glucose: 117 mg/dL — ABNORMAL HIGH (ref 65–99)
Potassium: 5 mmol/L (ref 3.5–5.2)
Sodium: 141 mmol/L (ref 134–144)
Total Protein: 7 g/dL (ref 6.0–8.5)

## 2019-12-02 LAB — CARDIOVASCULAR RISK ASSESSMENT

## 2019-12-02 LAB — HEMOGLOBIN A1C
Est. average glucose Bld gHb Est-mCnc: 120 mg/dL
Hgb A1c MFr Bld: 5.8 % — ABNORMAL HIGH (ref 4.8–5.6)

## 2019-12-02 LAB — CBC WITH DIFFERENTIAL/PLATELET
Basophils Absolute: 0 10*3/uL (ref 0.0–0.2)
Basos: 0 %
EOS (ABSOLUTE): 0.1 10*3/uL (ref 0.0–0.4)
Eos: 1 %
Hematocrit: 44 % (ref 37.5–51.0)
Hemoglobin: 14.7 g/dL (ref 13.0–17.7)
Immature Grans (Abs): 0 10*3/uL (ref 0.0–0.1)
Immature Granulocytes: 0 %
Lymphocytes Absolute: 1.9 10*3/uL (ref 0.7–3.1)
Lymphs: 16 %
MCH: 28.2 pg (ref 26.6–33.0)
MCHC: 33.4 g/dL (ref 31.5–35.7)
MCV: 85 fL (ref 79–97)
Monocytes Absolute: 0.7 10*3/uL (ref 0.1–0.9)
Monocytes: 6 %
Neutrophils Absolute: 8.9 10*3/uL — ABNORMAL HIGH (ref 1.4–7.0)
Neutrophils: 77 %
Platelets: 310 10*3/uL (ref 150–450)
RBC: 5.21 x10E6/uL (ref 4.14–5.80)
RDW: 13.1 % (ref 11.6–15.4)
WBC: 11.7 10*3/uL — ABNORMAL HIGH (ref 3.4–10.8)

## 2019-12-02 LAB — B12 AND FOLATE PANEL
Folate: 7.8 ng/mL (ref >3.0)
Vitamin B-12: 893 pg/mL (ref 232–1245)

## 2019-12-02 LAB — LIPID PANEL
Chol/HDL Ratio: 7.6 ratio — ABNORMAL HIGH (ref 0.0–5.0)
Cholesterol, Total: 313 mg/dL — ABNORMAL HIGH (ref 100–199)
HDL: 41 mg/dL (ref >39)
LDL Chol Calc (NIH): 249 mg/dL — ABNORMAL HIGH (ref 0–99)
Triglycerides: 123 mg/dL (ref 0–149)
VLDL Cholesterol Cal: 23 mg/dL (ref 5–40)

## 2019-12-02 LAB — TSH: TSH: 2.69 u[IU]/mL (ref 0.450–4.500)

## 2019-12-05 DIAGNOSIS — I776 Arteritis, unspecified: Secondary | ICD-10-CM | POA: Diagnosis not present

## 2019-12-05 DIAGNOSIS — I129 Hypertensive chronic kidney disease with stage 1 through stage 4 chronic kidney disease, or unspecified chronic kidney disease: Secondary | ICD-10-CM | POA: Diagnosis not present

## 2019-12-05 DIAGNOSIS — N1832 Chronic kidney disease, stage 3b: Secondary | ICD-10-CM | POA: Diagnosis not present

## 2019-12-08 NOTE — Progress Notes (Signed)
Established Patient Office Visit  Subjective:  Patient ID: Kerry Matthews, male    DOB: 12/10/1935  Age: 84 y.o. MRN: [id number]  CC:  Chief Complaint  Patient presents with  . Hyperlipidemia  . Hypertension  . Diabetes    HPI The patient presents with history of (type 2) diabetes mellitus without complications.  Compliance with treatment has been good; the patient takes medication as directed , maintains a diabetic diet and an exercise regimen , follows up as directed , and is keeping a glucose diary. Sugars runs 140-160.  Patient is taking glipizide 5 mg 1 1/2 in am and 1/2 before supper.  Patient specifically denies associated symptoms, including blurred vision, fatigue, polydipsia, polyphagia and polyuria . Patient denies hypoglycemia. In regard to preventative care, the patient performs foot self-exams daily.  The patient has hyperlipidemia.  Patient restarted atorvastatin 40 mg once daily, fish oil 1 gm 2 caps twice daily, and coenzyme q10.  Recommend low fat diet and exercise.  The patient has hypertension an dis currently taking aspirin and losartan 50 mg once daily at night.    Left knee pain persistent despite kenalog injection last week. Tylenol has not helped. He has been taking aleve which was helping some. His knee seems to giving out on him.    Past Medical History:  Diagnosis Date  . Bradycardia   . Coronary artery disease   . Diabetes ([place])   . GAD (generalized anxiety disorder)   . GERD (gastroesophageal reflux disease)   . Hyperlipidemia   . Hypertension   . Osteoarthritis   . Other emphysema ([place])   . Primary insomnia   . Renal stones   . Skin cancer   . UC (ulcerative colitis) ([place])   . UTI (urinary tract infection)     Past Surgical History:  Procedure Laterality Date  . ANGIOPLASTY    . BACK SURGERY    . CATARACT EXTRACTION    . COLONOSCOPY  06/16/2005   Mild colitis involving splenic flexure. Colonic polyps, status post polypectomy. Mild  pancolonic diverticulitits. Internal hemorrhoids.   . ESOPHAGOGASTRODUODENOSCOPY  04/26/2003   Irregular Z line suggestive of GERD. Mild gastritis status post CLO testing.   Marland Kitchen TRIGGER FINGER RELEASE      Family History  Problem Relation Age of Onset  . Tuberculosis Mother   . Stroke Father   . Pancreatic cancer Sister   . Heart attack Sister   . Lung disease Sister   . Clotting disorder Brother   . Colon cancer Neg Hx   . Esophageal cancer Neg Hx     Social History   Socioeconomic History  . Marital status: Married    Spouse name: Not on file  . Number of children: 2  . Years of education: Not on file  . Highest education level: Not on file  Occupational History  . Occupation: retired  Tobacco Use  . Smoking status: Never Smoker  . Smokeless tobacco: Never Used  Substance and Sexual Activity  . Alcohol use: Never  . Drug use: Never  . Sexual activity: Not on file  Other Topics Concern  . Not on file  Social History Narrative  . Not on file   Social Determinants of Health   Financial Resource Strain:   . Difficulty of Paying Living Expenses:   Food Insecurity:   . Worried About Charity fundraiser in the Last Year:   . Arboriculturist in the Last Year:   Transportation Needs:   .  Lack of Transportation (Medical):   Marland Kitchen Lack of Transportation (Non-Medical):   Physical Activity:   . Days of Exercise per Week:   . Minutes of Exercise per Session:   Stress:   . Feeling of Stress :   Social Connections:   . Frequency of Communication with Friends and Family:   . Frequency of Social Gatherings with Friends and Family:   . Attends Religious Services:   . Active Member of Clubs or Organizations:   . Attends Archivist Meetings:   Marland Kitchen Marital Status:   Intimate Partner Violence:   . Fear of Current or Ex-Partner:   . Emotionally Abused:   Marland Kitchen Physically Abused:   . Sexually Abused:     Outpatient Medications Prior to Visit  Medication Sig Dispense  Refill  . atorvastatin (LIPITOR) 20 MG tablet Take 20 mg by mouth daily.    . Coenzyme Q10 (CO Q 10) 10 MG CAPS Take 30 mg by mouth daily.    Marland Kitchen aspirin 81 MG EC tablet Take 81 mg by mouth daily.     . furosemide (LASIX) 40 MG tablet Take 1/2 (one-half) tablet by mouth once daily 45 tablet 0  . glipiZIDE (GLUCOTROL) 5 MG tablet Take 1 tablet (5 mg total) by mouth 2 (two) times daily. (Patient taking differently: Take 5 mg by mouth 2 (two) times daily. 1 1/2 tablet in the a.m. and 1/2 tablet before supper) 60 tablet 0  . losartan (COZAAR) 50 MG tablet TAKE 1 TABLET EVERY DAY 90 tablet 1  . Multiple Vitamins-Minerals (PRESERVISION AREDS 2+MULTI VIT) CAPS Take 1 capsule by mouth 2 (two) times daily.    . nitroGLYCERIN (NITROSTAT) 0.4 MG SL tablet Place 0.4 mg under the tongue every 5 (five) minutes as needed.    . Omega-3 Fatty Acids (FISH OIL) 1000 MG CAPS Take 1,000 mg by mouth 2 (two) times daily.     Marland Kitchen omeprazole (PRILOSEC) 20 MG capsule Take 20 mg by mouth daily.    . vitamin B-12 (CYANOCOBALAMIN) 1000 MCG tablet Take 1 tablet (1,000 mcg total) by mouth daily. 30 tablet 0   Facility-Administered Medications Prior to Visit  Medication Dose Route Frequency Provider Last Rate Last Admin  . triamcinolone acetonide (KENALOG-40) injection 80 mg  80 mg Intra-articular Once [name], MD        Allergies  Allergen Reactions  . Ace Inhibitors Other (See Comments)    Slow heart rate  . Hydrocodone Nausea And Vomiting  . Hydrocodone-Acetaminophen Nausea And Vomiting  . Sulfamethoxazole-Trimethoprim     Got sicker    ROS Review of Systems  Constitutional: Negative for chills, diaphoresis, fatigue and fever.  HENT: Negative for congestion, ear pain and sore throat.   Respiratory: Negative for cough and shortness of breath.   Cardiovascular: Negative for chest pain and leg swelling.  Gastrointestinal: Negative for abdominal pain, constipation, diarrhea, nausea and vomiting.  Genitourinary:  Negative for dysuria and urgency.  Musculoskeletal: Negative for arthralgias and myalgias.  Neurological: Negative for dizziness and headaches.  Psychiatric/Behavioral: Negative for dysphoric mood.      Objective:    Physical Exam  Constitutional: He appears well-developed and well-nourished.  Cardiovascular: Normal rate, regular rhythm, normal heart sounds and intact distal pulses.  Pulmonary/Chest: Effort normal and breath sounds normal.  Abdominal: Soft. Bowel sounds are normal. There is no abdominal tenderness.  Musculoskeletal:        General: Tenderness present.     Comments: Left knee patellar apprehension. Left knee  positive mcmurrays. Negative ligament laxity.  Neurological: He is alert.  Psychiatric: He has a normal mood and affect. His behavior is normal.    BP 122/64 (BP Location: Right Arm, Patient Position: Sitting)   Pulse 85   Temp (!) 97.5 F (36.4 C) (Temporal)   Ht 5\' 7"  (1.702 m)   Wt 187 lb (84.8 kg)   SpO2 98%   BMI 29.29 kg/m  Wt Readings from Last 3 Encounters:  12/12/19 187 lb (84.8 kg)  11/30/19 189 lb 12.8 oz (86.1 kg)  10/20/19 192 lb 6.4 oz (87.3 kg)     Health Maintenance Due  Topic Date Due  . FOOT EXAM  Never done  . OPHTHALMOLOGY EXAM  Never done  . TETANUS/TDAP  Never done  . PNA vac Low Risk Adult (1 of 2 - PCV13) Never done    There are no preventive care reminders to display for this patient.  Lab Results  Component Value Date   TSH 2.690 12/01/2019   Lab Results  Component Value Date   WBC 11.7 (H) 12/01/2019   HGB 14.7 12/01/2019   HCT 44.0 12/01/2019   MCV 85 12/01/2019   PLT 310 12/01/2019   Lab Results  Component Value Date   NA 141 12/01/2019   K 5.0 12/01/2019   CO2 24 12/01/2019   GLUCOSE 117 (H) 12/01/2019   BUN 19 12/01/2019   CREATININE 1.24 12/01/2019   BILITOT 0.8 12/01/2019   ALKPHOS 116 12/01/2019   AST 14 12/01/2019   ALT 9 12/01/2019   PROT 7.0 12/01/2019   ALBUMIN 4.3 12/01/2019   CALCIUM  9.9 12/01/2019   ANIONGAP 8 04/20/2019   Lab Results  Component Value Date   CHOL 313 (H) 12/01/2019   Lab Results  Component Value Date   HDL 41 12/01/2019   Lab Results  Component Value Date   LDLCALC 249 (H) 12/01/2019   Lab Results  Component Value Date   TRIG 123 12/01/2019   Lab Results  Component Value Date   CHOLHDL 7.6 (H) 12/01/2019   Lab Results  Component Value Date   HGBA1C 5.8 (H) 12/01/2019      Assessment & Plan:  1. Essential hypertension The current medical regimen is effective;  continue present plan and medications.  2. Dyslipidemia associated with type 2 diabetes mellitus ([place]) Control: well controlled Recommend check sugars fasting daily. Recommend check feet daily. Recommend annual eye exams. Medicines: no change Continue to work on eating a healthy diet and exercise.    3. Acute pain of left knee Rx for voltaren. Stop aleve. - MR Knee Left  Wo Contrast; Future  4. Paresthesias in left hand - DG Cervical Spine Complete; Future  Consider nerve conduction study pending results.  5. Paresthesias in right hand - DG Cervical Spine Complete; Future  Consider nerve conduction study pending results.  6. Mixed hyperlipidemia Poorly controlled.  No changes to medicines.  Continue to work on eating a healthy diet and exercise.   Follow-up: Return in about 3 months (around 03/12/2020) for fasting.    [name], MD

## 2019-12-12 ENCOUNTER — Encounter: Payer: Self-pay | Admitting: Family Medicine

## 2019-12-12 ENCOUNTER — Other Ambulatory Visit: Payer: Self-pay

## 2019-12-12 ENCOUNTER — Ambulatory Visit (INDEPENDENT_AMBULATORY_CARE_PROVIDER_SITE_OTHER): Payer: Medicare HMO | Admitting: Family Medicine

## 2019-12-12 VITALS — BP 122/64 | HR 85 | Temp 97.5°F | Ht 67.0 in | Wt 187.0 lb

## 2019-12-12 DIAGNOSIS — M25562 Pain in left knee: Secondary | ICD-10-CM

## 2019-12-12 DIAGNOSIS — I10 Essential (primary) hypertension: Secondary | ICD-10-CM

## 2019-12-12 DIAGNOSIS — E782 Mixed hyperlipidemia: Secondary | ICD-10-CM | POA: Diagnosis not present

## 2019-12-12 DIAGNOSIS — M47812 Spondylosis without myelopathy or radiculopathy, cervical region: Secondary | ICD-10-CM | POA: Diagnosis not present

## 2019-12-12 DIAGNOSIS — E785 Hyperlipidemia, unspecified: Secondary | ICD-10-CM

## 2019-12-12 DIAGNOSIS — I6523 Occlusion and stenosis of bilateral carotid arteries: Secondary | ICD-10-CM | POA: Diagnosis not present

## 2019-12-12 DIAGNOSIS — E1169 Type 2 diabetes mellitus with other specified complication: Secondary | ICD-10-CM | POA: Diagnosis not present

## 2019-12-12 DIAGNOSIS — R202 Paresthesia of skin: Secondary | ICD-10-CM

## 2019-12-12 MED ORDER — DICLOFENAC SODIUM 1 % EX GEL: 4.0000 g | Freq: Four times a day (QID) | CUTANEOUS | 1 refills | Status: DC

## 2019-12-12 NOTE — Patient Instructions (Signed)
For knee pain: Use Voltaren gel 4 g 4 times a day as needed.  No Aleve or ibuprofen.  May use Tylenol. Order MRI of knee. For numbness in hands: Ordering cervical spine x-ray.  Consider nerve conduction study pending results. For diabetes May take extra half dose of glipizide before supper.  Recommend work on diet and avoiding sugar.

## 2019-12-13 ENCOUNTER — Other Ambulatory Visit: Payer: Self-pay

## 2019-12-13 DIAGNOSIS — R937 Abnormal findings on diagnostic imaging of other parts of musculoskeletal system: Secondary | ICD-10-CM

## 2019-12-13 DIAGNOSIS — R202 Paresthesia of skin: Secondary | ICD-10-CM

## 2019-12-14 ENCOUNTER — Other Ambulatory Visit: Payer: Self-pay

## 2019-12-14 MED ORDER — GLIPIZIDE 5 MG PO TABS: 5.0000 mg | ORAL_TABLET | Freq: Two times a day (BID) | ORAL | 0 refills | Status: DC

## 2019-12-19 ENCOUNTER — Encounter: Payer: Self-pay | Admitting: Family Medicine

## 2019-12-19 DIAGNOSIS — S72432A Displaced fracture of medial condyle of left femur, initial encounter for closed fracture: Secondary | ICD-10-CM | POA: Diagnosis not present

## 2019-12-19 DIAGNOSIS — M47812 Spondylosis without myelopathy or radiculopathy, cervical region: Secondary | ICD-10-CM | POA: Diagnosis not present

## 2019-12-19 DIAGNOSIS — M50221 Other cervical disc displacement at C4-C5 level: Secondary | ICD-10-CM | POA: Diagnosis not present

## 2019-12-19 DIAGNOSIS — M7122 Synovial cyst of popliteal space [Baker], left knee: Secondary | ICD-10-CM | POA: Diagnosis not present

## 2019-12-19 DIAGNOSIS — M25562 Pain in left knee: Secondary | ICD-10-CM | POA: Diagnosis not present

## 2019-12-19 DIAGNOSIS — X58XXXA Exposure to other specified factors, initial encounter: Secondary | ICD-10-CM | POA: Diagnosis not present

## 2019-12-19 DIAGNOSIS — R937 Abnormal findings on diagnostic imaging of other parts of musculoskeletal system: Secondary | ICD-10-CM | POA: Diagnosis not present

## 2019-12-19 DIAGNOSIS — M4802 Spinal stenosis, cervical region: Secondary | ICD-10-CM | POA: Diagnosis not present

## 2019-12-20 DIAGNOSIS — E119 Type 2 diabetes mellitus without complications: Secondary | ICD-10-CM | POA: Diagnosis not present

## 2019-12-20 DIAGNOSIS — H35013 Changes in retinal vascular appearance, bilateral: Secondary | ICD-10-CM | POA: Diagnosis not present

## 2019-12-20 DIAGNOSIS — H524 Presbyopia: Secondary | ICD-10-CM | POA: Diagnosis not present

## 2019-12-20 DIAGNOSIS — Z961 Presence of intraocular lens: Secondary | ICD-10-CM | POA: Diagnosis not present

## 2019-12-20 DIAGNOSIS — Z7984 Long term (current) use of oral hypoglycemic drugs: Secondary | ICD-10-CM | POA: Diagnosis not present

## 2019-12-20 LAB — HM DIABETES EYE EXAM

## 2019-12-22 ENCOUNTER — Encounter: Payer: Self-pay | Admitting: Orthopaedic Surgery

## 2019-12-22 ENCOUNTER — Ambulatory Visit: Payer: Medicare HMO | Admitting: Orthopaedic Surgery

## 2019-12-22 ENCOUNTER — Other Ambulatory Visit: Payer: Self-pay

## 2019-12-22 DIAGNOSIS — M84452A Pathological fracture, left femur, initial encounter for fracture: Secondary | ICD-10-CM

## 2019-12-22 DIAGNOSIS — S82115A Nondisplaced fracture of left tibial spine, initial encounter for closed fracture: Secondary | ICD-10-CM | POA: Diagnosis not present

## 2019-12-22 MED ORDER — ONDANSETRON HCL 4 MG PO TABS: 4.0000 mg | ORAL_TABLET | Freq: Three times a day (TID) | ORAL | 0 refills | Status: DC | PRN

## 2019-12-22 MED ORDER — TRAMADOL HCL 50 MG PO TABS: 50.0000 mg | ORAL_TABLET | Freq: Three times a day (TID) | ORAL | 0 refills | Status: DC | PRN

## 2019-12-22 NOTE — Progress Notes (Signed)
Office Visit Note   Patient: Kerry Matthews           Date of Birth: [birth year]           MRN: [id number] Visit Date: 12/22/2019              Requested by: [name], MD [id number] [place],   [id number] PCP: [name], MD   Assessment & Plan: Visit Diagnoses:  1. Insufficiency fracture of femur, left, initial encounter ([place])   2. Nondisplaced fracture of left tibial spine, initial encounter for closed fracture     Plan: Impression is left knee insufficiency fracture medial femoral condyle and nondisplaced intra-articular fracture of the tibial spine.  Due to the patient's age and activity level, we will treat this nonoperatively.  We have written a prescription for a medial compartment DJD unloader brace for [employer].  They will be in touch with patient for fitting.  Have called in a prescription of tramadol to use for pain.  He will follow-up with [place] in 6 weeks time for recheck.  Call with concerns or questions. All of this was explained to the daughter on the phone as well.   Total face to face encounter time was greater than 45 minutes and over half of this time was spent in counseling and/or coordination of care.  Follow-Up Instructions: Return in about 6 weeks (around 02/02/2020).   Orders:  No orders of the defined types were placed in this encounter.  Meds ordered this encounter  Medications  . traMADol (ULTRAM) 50 MG tablet    Sig: Take 1 tablet (50 mg total) by mouth 3 (three) times daily as needed.    Dispense:  30 tablet    Refill:  0  . ondansetron (ZOFRAN) 4 MG tablet    Sig: Take 1 tablet (4 mg total) by mouth every 8 (eight) hours as needed for nausea or vomiting.    Dispense:  40 tablet    Refill:  0      Procedures: No procedures performed   Clinical Data: No additional findings.   Subjective: Chief Complaint  Patient presents with  . Left Knee - Pain    HPI is a pleasant 84 year old gentleman who comes in today following an  injury to the left knee.  Initial injury was on 08/18/2019 when he fell onto the left knee.  He has had pain to the medial aspect since.  His pain does seem to improve and then worsen with time.  He describes this now as a constant ache worse with bearing weight as well as at night.  He has tried Voltaren gel and Tylenol without significant relief of symptoms.  He has had 2 cortisone injections since January with the last one being in early April.  No relief of symptoms from that injection.  Subsequent MRI was ordered which showed insufficiency fracture medial femoral condyle and nondisplaced longitudinal intra-articular fracture of the tibial spine.  He comes in today for further evaluation treatment recommendation.  Review of Systems as detailed in HPI.  All others reviewed and are negative.   Objective: Vital Signs: There were no vitals taken for this visit.  Physical Exam well-developed well-nourished gentleman in no acute distress.  Alert oriented x3.  Ortho Exam examination of his left knee shows a trace effusion.  He does have moderate tenderness to the medial femoral condyle.  Ligaments are stable.  He is neurovascular intact distally.  Specialty Comments:  No specialty comments  available.  Imaging: No new imaging   PMFS History: Patient Active Problem List   Diagnosis Date Noted  . Acute pain of left knee 11/30/2019  . Paresthesias in left hand 11/30/2019  . Alpha-1-antitrypsin deficiency carrier 05/04/2019  . Sepsis ([place]) 04/19/2019  . Myalgia 04/19/2019  . History of alpha-1-antitrypsin deficiency 04/12/2019  . Elevated rheumatoid factor 04/12/2019  . Interstitial pulmonary disease ([place]) 04/12/2019  . Abnormal ANCA test 04/12/2019  . Abnormal findings on diagnostic imaging of lung 04/12/2019  . Transaminitis   . Hypoxemia   . Polymyositis ([place])   . Low vitamin B12 level 04/03/2019  . Community acquired pneumonia   . Pneumonitis   . Cough   . Fever   . Abnormal CT of  the chest   . Lower extremity edema   . AKI (acute kidney injury) ([place])   . Anemia   . Pneumonia 04/01/2019  . Elevated LFTs 04/01/2019  . Diabetes mellitus type 2 in obese ([place]) 04/01/2019  . Dyslipidemia associated with type 2 diabetes mellitus ([place]) 05/03/2018  . Hypertension 05/03/2018  . Mixed hyperlipidemia 05/03/2018  . Atherosclerotic heart disease of native coronary artery without angina pectoris 05/03/2018  . GERD (gastroesophageal reflux disease) 05/03/2018  . Trigger finger 05/03/2018  . Bradycardia 05/03/2018  . CKD (chronic kidney disease) 05/03/2018  . Insomnia 05/03/2018  . Emphysema (subcutaneous) (surgical) resulting from a procedure 05/03/2018   Past Medical History:  Diagnosis Date  . Bradycardia   . Coronary artery disease   . Diabetes ([place])   . GAD (generalized anxiety disorder)   . GERD (gastroesophageal reflux disease)   . Hyperlipidemia   . Hypertension   . Osteoarthritis   . Other emphysema ([place])   . Primary insomnia   . Renal stones   . Skin cancer   . UC (ulcerative colitis) ([place])   . UTI (urinary tract infection)     Family History  Problem Relation Age of Onset  . Tuberculosis Mother   . Stroke Father   . Pancreatic cancer Sister   . Heart attack Sister   . Lung disease Sister   . Clotting disorder Brother   . Colon cancer Neg Hx   . Esophageal cancer Neg Hx     Past Surgical History:  Procedure Laterality Date  . ANGIOPLASTY    . BACK SURGERY    . CATARACT EXTRACTION    . COLONOSCOPY  06/16/2005   Mild colitis involving splenic flexure. Colonic polyps, status post polypectomy. Mild pancolonic diverticulitits. Internal hemorrhoids.   . ESOPHAGOGASTRODUODENOSCOPY  04/26/2003   Irregular Z line suggestive of GERD. Mild gastritis status post CLO testing.   Marland Kitchen TRIGGER FINGER RELEASE     Social History   Occupational History  . Occupation: retired  Tobacco Use  . Smoking status: Never Smoker  . Smokeless tobacco: Never Used   Substance and Sexual Activity  . Alcohol use: Never  . Drug use: Never  . Sexual activity: Not on file

## 2019-12-27 ENCOUNTER — Other Ambulatory Visit (HOSPITAL_COMMUNITY): Payer: Medicare HMO

## 2020-01-05 DIAGNOSIS — Z6829 Body mass index (BMI) 29.0-29.9, adult: Secondary | ICD-10-CM

## 2020-01-05 DIAGNOSIS — M317 Microscopic polyangiitis: Secondary | ICD-10-CM | POA: Diagnosis not present

## 2020-01-05 DIAGNOSIS — R200 Anesthesia of skin: Secondary | ICD-10-CM | POA: Diagnosis not present

## 2020-01-05 DIAGNOSIS — R208 Other disturbances of skin sensation: Secondary | ICD-10-CM

## 2020-01-05 HISTORY — DX: Other disturbances of skin sensation: R20.8

## 2020-01-05 HISTORY — DX: Body mass index (BMI) 29.0-29.9, adult: Z68.29

## 2020-01-11 DIAGNOSIS — M1712 Unilateral primary osteoarthritis, left knee: Secondary | ICD-10-CM | POA: Diagnosis not present

## 2020-01-19 DIAGNOSIS — M317 Microscopic polyangiitis: Secondary | ICD-10-CM | POA: Diagnosis not present

## 2020-01-30 DIAGNOSIS — G629 Polyneuropathy, unspecified: Secondary | ICD-10-CM | POA: Insufficient documentation

## 2020-01-30 DIAGNOSIS — R200 Anesthesia of skin: Secondary | ICD-10-CM | POA: Diagnosis not present

## 2020-01-30 HISTORY — DX: Polyneuropathy, unspecified: G62.9

## 2020-01-31 DIAGNOSIS — R768 Other specified abnormal immunological findings in serum: Secondary | ICD-10-CM | POA: Diagnosis not present

## 2020-01-31 DIAGNOSIS — M317 Microscopic polyangiitis: Secondary | ICD-10-CM | POA: Diagnosis not present

## 2020-01-31 DIAGNOSIS — Z6828 Body mass index (BMI) 28.0-28.9, adult: Secondary | ICD-10-CM | POA: Diagnosis not present

## 2020-01-31 DIAGNOSIS — J84112 Idiopathic pulmonary fibrosis: Secondary | ICD-10-CM | POA: Diagnosis not present

## 2020-01-31 DIAGNOSIS — M791 Myalgia, unspecified site: Secondary | ICD-10-CM | POA: Diagnosis not present

## 2020-01-31 DIAGNOSIS — E663 Overweight: Secondary | ICD-10-CM | POA: Diagnosis not present

## 2020-02-02 ENCOUNTER — Ambulatory Visit: Payer: Medicare HMO | Admitting: Orthopaedic Surgery

## 2020-02-10 ENCOUNTER — Other Ambulatory Visit: Payer: Self-pay | Admitting: Family Medicine

## 2020-02-16 DIAGNOSIS — G629 Polyneuropathy, unspecified: Secondary | ICD-10-CM | POA: Diagnosis not present

## 2020-02-24 DIAGNOSIS — E041 Nontoxic single thyroid nodule: Secondary | ICD-10-CM | POA: Diagnosis not present

## 2020-02-24 DIAGNOSIS — E042 Nontoxic multinodular goiter: Secondary | ICD-10-CM | POA: Diagnosis not present

## 2020-02-28 ENCOUNTER — Other Ambulatory Visit: Payer: Self-pay

## 2020-02-29 DIAGNOSIS — E041 Nontoxic single thyroid nodule: Secondary | ICD-10-CM | POA: Diagnosis not present

## 2020-03-06 ENCOUNTER — Encounter: Payer: Self-pay | Admitting: Family Medicine

## 2020-03-06 ENCOUNTER — Ambulatory Visit (INDEPENDENT_AMBULATORY_CARE_PROVIDER_SITE_OTHER): Payer: Medicare HMO | Admitting: Family Medicine

## 2020-03-06 ENCOUNTER — Other Ambulatory Visit: Payer: Self-pay

## 2020-03-06 VITALS — BP 118/60 | HR 54 | Temp 97.2°F | Ht 67.0 in | Wt 186.0 lb

## 2020-03-06 DIAGNOSIS — E669 Obesity, unspecified: Secondary | ICD-10-CM | POA: Diagnosis not present

## 2020-03-06 DIAGNOSIS — D72829 Elevated white blood cell count, unspecified: Secondary | ICD-10-CM

## 2020-03-06 DIAGNOSIS — M545 Low back pain, unspecified: Secondary | ICD-10-CM

## 2020-03-06 DIAGNOSIS — E782 Mixed hyperlipidemia: Secondary | ICD-10-CM | POA: Diagnosis not present

## 2020-03-06 DIAGNOSIS — I10 Essential (primary) hypertension: Secondary | ICD-10-CM | POA: Diagnosis not present

## 2020-03-06 DIAGNOSIS — E1169 Type 2 diabetes mellitus with other specified complication: Secondary | ICD-10-CM | POA: Diagnosis not present

## 2020-03-06 LAB — POCT URINALYSIS DIPSTICK
Bilirubin, UA: NEGATIVE
Blood, UA: NEGATIVE
Glucose, UA: NEGATIVE
Ketones, UA: NEGATIVE
Leukocytes, UA: NEGATIVE
Nitrite, UA: NEGATIVE
Protein, UA: NEGATIVE
Spec Grav, UA: 1.025 (ref 1.010–1.025)
Urobilinogen, UA: 2 E.U./dL — AB
pH, UA: 6 (ref 5.0–8.0)

## 2020-03-06 LAB — POCT UA - MICROALBUMIN: Microalbumin Ur, POC: 10 mg/L

## 2020-03-06 MED ORDER — ATORVASTATIN CALCIUM 20 MG PO TABS: 20.0000 mg | ORAL_TABLET | Freq: Every day | ORAL | 1 refills | Status: DC

## 2020-03-06 NOTE — Patient Instructions (Addendum)
Apply a heating pad and take tylenol 500 mg (6 pills maximum per day) Avoid heavy lifting

## 2020-03-06 NOTE — Progress Notes (Signed)
Acute Office Visit  Subjective:    Patient ID: Kerry Matthews, male    DOB: [birth year], 84 y.o.   MRN: [id number]  Chief Complaint  Patient presents with  . Back Pain    x 1 week    HPI Patient is in today for lower back pain states it has been present for 1 week. Patient believes his back pain may be from sitting for extended amounts of time in a hard chair for church service. Patient states he mowed and weed eat his yard last week. Pain is in lower and does not radiate. Pain is located lower midline. Hx of back surgery approx 20 years ago. With bending over or standing straight up patient feels pressure 8/10. Pt sees rheumatology for follow up-infusion weekly -hospitalized for muscle pain and fever-pt taken off Lipitor while hospital. -restarted 4/21  H/o elevated glucose Hands/feet numbness-thought to be neuropathy Pt fell  12/24 injury to left knee-ortho gave pt a brace Past Medical History:  Diagnosis Date  . Bradycardia   . Coronary artery disease   . Diabetes ([place])   . GAD (generalized anxiety disorder)   . GERD (gastroesophageal reflux disease)   . Hyperlipidemia   . Hypertension   . Osteoarthritis   . Other emphysema ([place])   . Primary insomnia   . Renal stones   . Skin cancer   . UC (ulcerative colitis) ([place])   . UTI (urinary tract infection)     Past Surgical History:  Procedure Laterality Date  . ANGIOPLASTY    . BACK SURGERY    . CATARACT EXTRACTION    . COLONOSCOPY  06/16/2005   Mild colitis involving splenic flexure. Colonic polyps, status post polypectomy. Mild pancolonic diverticulitits. Internal hemorrhoids.   . ESOPHAGOGASTRODUODENOSCOPY  04/26/2003   Irregular Z line suggestive of GERD. Mild gastritis status post CLO testing.   Marland Kitchen TRIGGER FINGER RELEASE      Family History  Problem Relation Age of Onset  . Tuberculosis Mother   . Stroke Father   . Pancreatic cancer Sister   . Heart attack Sister   . Lung disease Sister   . Clotting disorder  Brother   . Colon cancer Neg Hx   . Esophageal cancer Neg Hx     Social History   Socioeconomic History  . Marital status: Married    Spouse name: Not on file  . Number of children: 2  . Years of education: Not on file  . Highest education level: Not on file  Occupational History  . Occupation: retired  Tobacco Use  . Smoking status: Never Smoker  . Smokeless tobacco: Never Used  Vaping Use  . Vaping Use: Never used  Substance and Sexual Activity  . Alcohol use: Never  . Drug use: Never  . Sexual activity: Not on file  Other Topics Concern  . Not on file  Social History Narrative  . Not on file   Social Determinants of Health   Financial Resource Strain:   . Difficulty of Paying Living Expenses:   Food Insecurity:   . Worried About Charity fundraiser in the Last Year:   . Arboriculturist in the Last Year:   Transportation Needs:   . Film/video editor (Medical):   Marland Kitchen Lack of Transportation (Non-Medical):   Physical Activity:   . Days of Exercise per Week:   . Minutes of Exercise per Session:   Stress:   . Feeling of Stress :   Social Connections:   .  Frequency of Communication with Friends and Family:   . Frequency of Social Gatherings with Friends and Family:   . Attends Religious Services:   . Active Member of Clubs or Organizations:   . Attends Archivist Meetings:   Marland Kitchen Marital Status:   Intimate Partner Violence:   . Fear of Current or Ex-Partner:   . Emotionally Abused:   Marland Kitchen Physically Abused:   . Sexually Abused:     Outpatient Medications Prior to Visit  Medication Sig Dispense Refill  . aspirin 81 MG EC tablet Take 81 mg by mouth daily.     Marland Kitchen atorvastatin (LIPITOR) 20 MG tablet Take 20 mg by mouth daily.    . Coenzyme Q10 (CO Q 10) 10 MG CAPS Take 30 mg by mouth daily.    . furosemide (LASIX) 40 MG tablet Take 1/2 (one-half) tablet by mouth once daily 45 tablet 0  . gabapentin (NEURONTIN) 300 MG capsule Take 300 mg by mouth in the  morning and at bedtime.    Marland Kitchen glipiZIDE (GLUCOTROL) 5 MG tablet TAKE 1 TABLET TWICE DAILY 180 tablet 0  . losartan (COZAAR) 50 MG tablet TAKE 1 TABLET EVERY DAY 90 tablet 1  . nitroGLYCERIN (NITROSTAT) 0.4 MG SL tablet Place 0.4 mg under the tongue every 5 (five) minutes as needed.    . Omega-3 Fatty Acids (FISH OIL) 1000 MG CAPS Take 1,000 mg by mouth 2 (two) times daily.     Marland Kitchen omeprazole (PRILOSEC) 20 MG capsule Take 20 mg by mouth daily.    . TRUE METRIX BLOOD GLUCOSE TEST test strip     . vitamin B-12 (CYANOCOBALAMIN) 1000 MCG tablet Take 1 tablet (1,000 mcg total) by mouth daily. 30 tablet 0  . diclofenac Sodium (VOLTAREN) 1 % GEL Apply 4 g topically 4 (four) times daily. 500 g 1  . Multiple Vitamins-Minerals (PRESERVISION AREDS 2+MULTI VIT) CAPS Take 1 capsule by mouth 2 (two) times daily.    . ondansetron (ZOFRAN) 4 MG tablet Take 1 tablet (4 mg total) by mouth every 8 (eight) hours as needed for nausea or vomiting. 40 tablet 0  . traMADol (ULTRAM) 50 MG tablet Take 1 tablet (50 mg total) by mouth 3 (three) times daily as needed. 30 tablet 0  . triamcinolone acetonide (KENALOG-40) injection 80 mg      No facility-administered medications prior to visit.    Allergies  Allergen Reactions  . Ace Inhibitors Other (See Comments)    Slow heart rate  . Hydrocodone Nausea And Vomiting  . Hydrocodone-Acetaminophen Nausea And Vomiting  . Sulfamethoxazole-Trimethoprim     Got sicker    Review of Systems  Constitutional: Negative for chills, diaphoresis, fatigue and fever.  HENT: Negative for congestion, ear pain and sore throat.   Respiratory: Negative for cough and shortness of breath.   Cardiovascular: Negative for chest pain and leg swelling.  Gastrointestinal: Negative for abdominal pain, constipation, diarrhea, nausea and vomiting.  Genitourinary: Negative for dysuria and urgency.  Musculoskeletal: Positive for back pain (lower). Negative for arthralgias and myalgias.   Neurological: Positive for numbness (Started gabapentin). Negative for dizziness and headaches.  Psychiatric/Behavioral: Negative for dysphoric mood.       Objective:    Physical Exam  BP 118/60   Pulse (!) 54   Temp (!) 97.2 F (36.2 C)   Ht 5\' 7"  (1.702 m)   Wt 186 lb (84.4 kg)   SpO2 97%   BMI 29.13 kg/m  Wt Readings from Last 3 Encounters:  03/06/20 186 lb (84.4 kg)  12/12/19 187 lb (84.8 kg)  11/30/19 189 lb 12.8 oz (86.1 kg)    Health Maintenance Due  Topic Date Due  . FOOT EXAM  Never done  . TETANUS/TDAP  Never done  . PNA vac Low Risk Adult (1 of 2 - PCV13) Never done     Lab Results  Component Value Date   TSH 2.690 12/01/2019   Lab Results  Component Value Date   WBC 11.7 (H) 12/01/2019   HGB 14.7 12/01/2019   HCT 44.0 12/01/2019   MCV 85 12/01/2019   PLT 310 12/01/2019   Lab Results  Component Value Date   NA 141 12/01/2019   K 5.0 12/01/2019   CO2 24 12/01/2019   GLUCOSE 117 (H) 12/01/2019   BUN 19 12/01/2019   CREATININE 1.24 12/01/2019   BILITOT 0.8 12/01/2019   ALKPHOS 116 12/01/2019   AST 14 12/01/2019   ALT 9 12/01/2019   PROT 7.0 12/01/2019   ALBUMIN 4.3 12/01/2019   CALCIUM 9.9 12/01/2019   ANIONGAP 8 04/20/2019   Lab Results  Component Value Date   CHOL 313 (H) 12/01/2019   Lab Results  Component Value Date   HDL 41 12/01/2019   Lab Results  Component Value Date   LDLCALC 249 (H) 12/01/2019   Lab Results  Component Value Date   TRIG 123 12/01/2019   Lab Results  Component Value Date   CHOLHDL 7.6 (H) 12/01/2019   Lab Results  Component Value Date   HGBA1C 5.8 (H) 12/01/2019         Assessment & Plan:   1. Acute low back pain without sciatica, unspecified back pain laterality Sees rheumatology-infusions currently - POCT urinalysis dipstick - DG Lumbar Spine Complete; Future  2. Essential hypertension - POCT UA - Microalbumins-takes meds daily - Comprehensive metabolic panel  3. Mixed  hyperlipidemia Check levels for baseline back on medicaton-TC 313 off meds, LDL249 - atorvastatin (LIPITOR) 20 MG tablet; Take 1 tablet (20 mg total) by mouth daily.  Dispense: 90 tablet; Refill: 1 - Lipid panel  4. Diabetes mellitus type 2 in obese (HCC) 5.8% previously - Hemoglobin A1c - Comprehensive metabolic panel  5. Leukocytosis, unspecified type ? Related to acute illness in the past - CBC with Differential/Platelet Arsenio Katz, CMA

## 2020-03-08 DIAGNOSIS — D72829 Elevated white blood cell count, unspecified: Secondary | ICD-10-CM

## 2020-03-08 DIAGNOSIS — M545 Low back pain, unspecified: Secondary | ICD-10-CM

## 2020-03-08 HISTORY — DX: Elevated white blood cell count, unspecified: D72.829

## 2020-03-08 HISTORY — DX: Low back pain, unspecified: M54.50

## 2020-03-12 ENCOUNTER — Other Ambulatory Visit: Payer: Self-pay | Admitting: Family Medicine

## 2020-03-13 ENCOUNTER — Ambulatory Visit: Payer: Medicare HMO | Admitting: Family Medicine

## 2020-04-04 ENCOUNTER — Other Ambulatory Visit: Payer: Self-pay

## 2020-04-04 ENCOUNTER — Other Ambulatory Visit: Payer: Medicare HMO

## 2020-04-04 DIAGNOSIS — E1169 Type 2 diabetes mellitus with other specified complication: Secondary | ICD-10-CM | POA: Diagnosis not present

## 2020-04-04 DIAGNOSIS — E785 Hyperlipidemia, unspecified: Secondary | ICD-10-CM | POA: Diagnosis not present

## 2020-04-04 DIAGNOSIS — E782 Mixed hyperlipidemia: Secondary | ICD-10-CM

## 2020-04-04 NOTE — Progress Notes (Signed)
Established Patient Office Visit  Subjective:  Patient ID: Kerry Matthews, male    DOB: 07-08-1936  Age: 84 y.o. MRN: [id number]  CC:  Chief Complaint  Patient presents with  . Diabetes  . Hyperlipidemia  . Hypertension    HPI The patient presents with history of (type 2) diabetes mellitus without complications.  Compliance with treatment has been good; the patient takes medication as directed , maintains a diabetic diet and an exercise regimen , follows up as directed , and is keeping a glucose diary. Sugars runs 58-122.  Patient is taking glipizide 5 mg 1 bid.  Patient specifically denies associated symptoms, including blurred vision, fatigue, polydipsia, polyphagia and polyuria.  In regard to preventative care, the patient performs foot self-exams daily.   The patient has hyperlipidemia.  Patient on atorvastatin 20  mg once daily, fish oil 1 gm 2 caps twice daily, and coenzyme q10.  Recommend low fat diet and exercise.  The patient has hypertension and is currently taking aspirin and losartan 25 mg once daily at night.     Past Medical History:  Diagnosis Date  . Bradycardia   . Coronary artery disease   . Diabetes ([place])   . GAD (generalized anxiety disorder)   . GERD (gastroesophageal reflux disease)   . Hyperlipidemia   . Hypertension   . Osteoarthritis   . Other emphysema ([place])   . Primary insomnia   . Renal stones   . Skin cancer   . UC (ulcerative colitis) ([place])   . UTI (urinary tract infection)     Past Surgical History:  Procedure Laterality Date  . ANGIOPLASTY    . BACK SURGERY    . CATARACT EXTRACTION    . COLONOSCOPY  06/16/2005   Mild colitis involving splenic flexure. Colonic polyps, status post polypectomy. Mild pancolonic diverticulitits. Internal hemorrhoids.   . ESOPHAGOGASTRODUODENOSCOPY  04/26/2003   Irregular Z line suggestive of GERD. Mild gastritis status post CLO testing.   Marland Kitchen TRIGGER FINGER RELEASE      Family History  Problem Relation Age  of Onset  . Tuberculosis Mother   . Stroke Father   . Pancreatic cancer Sister   . Heart attack Sister   . Lung disease Sister   . Clotting disorder Brother   . Colon cancer Neg Hx   . Esophageal cancer Neg Hx     Social History   Socioeconomic History  . Marital status: Married    Spouse name: Not on file  . Number of children: 2  . Years of education: Not on file  . Highest education level: Not on file  Occupational History  . Occupation: retired  Tobacco Use  . Smoking status: Never Smoker  . Smokeless tobacco: Never Used  Vaping Use  . Vaping Use: Never used  Substance and Sexual Activity  . Alcohol use: Never  . Drug use: Never  . Sexual activity: Not on file  Other Topics Concern  . Not on file  Social History Narrative  . Not on file   Social Determinants of Health   Financial Resource Strain:   . Difficulty of Paying Living Expenses:   Food Insecurity:   . Worried About Charity fundraiser in the Last Year:   . Arboriculturist in the Last Year:   Transportation Needs:   . Film/video editor (Medical):   Marland Kitchen Lack of Transportation (Non-Medical):   Physical Activity:   . Days of Exercise per Week:   . Minutes  of Exercise per Session:   Stress:   . Feeling of Stress :   Social Connections:   . Frequency of Communication with Friends and Family:   . Frequency of Social Gatherings with Friends and Family:   . Attends Religious Services:   . Active Member of Clubs or Organizations:   . Attends Archivist Meetings:   Marland Kitchen Marital Status:   Intimate Partner Violence:   . Fear of Current or Ex-Partner:   . Emotionally Abused:   Marland Kitchen Physically Abused:   . Sexually Abused:     Outpatient Medications Prior to Visit  Medication Sig Dispense Refill  . aspirin 81 MG EC tablet Take 81 mg by mouth daily.     Marland Kitchen atorvastatin (LIPITOR) 20 MG tablet Take 1 tablet (20 mg total) by mouth daily. 90 tablet 1  . Coenzyme Q10 (CO Q 10) 10 MG CAPS Take 30 mg by  mouth daily.    . furosemide (LASIX) 40 MG tablet Take 1/2 (one-half) tablet by mouth once daily 45 tablet 0  . gabapentin (NEURONTIN) 300 MG capsule Take 300 mg by mouth in the morning and at bedtime.    Marland Kitchen glipiZIDE (GLUCOTROL) 5 MG tablet TAKE 1 TABLET TWICE DAILY 180 tablet 0  . glucose blood (TRUE METRIX BLOOD GLUCOSE TEST) test strip TEST BLOOD SUGAR THREE TIMES DAILY BEFORE MEALS 300 strip 3  . losartan (COZAAR) 50 MG tablet TAKE 1 TABLET EVERY DAY (Patient taking differently: 25 mg daily. ) 90 tablet 1  . Multiple Vitamins-Minerals (EYE VITAMINS PO) Take by mouth daily.    . nitroGLYCERIN (NITROSTAT) 0.4 MG SL tablet Place 0.4 mg under the tongue every 5 (five) minutes as needed.    . Omega-3 Fatty Acids (FISH OIL) 1000 MG CAPS Take 1,000 mg by mouth 2 (two) times daily.     Marland Kitchen omeprazole (PRILOSEC) 20 MG capsule Take 20 mg by mouth daily.    . vitamin B-12 (CYANOCOBALAMIN) 1000 MCG tablet Take 1 tablet (1,000 mcg total) by mouth daily. 30 tablet 0   No facility-administered medications prior to visit.    Allergies  Allergen Reactions  . Ace Inhibitors Other (See Comments)    Slow heart rate  . Hydrocodone Nausea And Vomiting  . Hydrocodone-Acetaminophen Nausea And Vomiting  . Sulfamethoxazole-Trimethoprim     Got sicker    ROS Review of Systems  Constitutional: Negative for chills, diaphoresis, fatigue and fever.  HENT: Negative for congestion, ear pain and sore throat.   Respiratory: Negative for cough and shortness of breath.   Cardiovascular: Negative for chest pain, palpitations and leg swelling.  Gastrointestinal: Negative for abdominal pain, constipation, diarrhea, nausea and vomiting.  Endocrine: Negative for polydipsia, polyphagia and polyuria.  Genitourinary: Negative for dysuria, frequency and urgency.  Musculoskeletal: Positive for back pain. Negative for arthralgias and myalgias.  Neurological: Negative for dizziness, weakness and headaches.   Psychiatric/Behavioral: Negative for dysphoric mood. The patient is not nervous/anxious.       Objective:    Physical Exam Constitutional:      Appearance: He is well-developed.  Cardiovascular:     Rate and Rhythm: Normal rate and regular rhythm.     Heart sounds: Normal heart sounds.  Pulmonary:     Effort: Pulmonary effort is normal.     Breath sounds: Normal breath sounds.  Abdominal:     General: Bowel sounds are normal.     Palpations: Abdomen is soft.     Tenderness: There is no abdominal tenderness.  Musculoskeletal:     Comments: Left knee patellar apprehension. Left knee positive mcmurrays. Negative ligament laxity.  Neurological:     Mental Status: He is alert.  Psychiatric:        Behavior: Behavior normal.    Diabetic Foot Exam - Simple   Simple Foot Form Visual Inspection See comments: Yes Sensation Testing See comments: Yes Pulse Check Posterior Tibialis and Dorsalis pulse intact bilaterally: Yes Comments Decreased sensation BL feet.  Callused feet on bottom. Thickened nails.     BP 120/60   Pulse 72   Temp (!) 97.3 F (36.3 C)   Resp 16   Ht 5\' 7"  (1.702 m)   Wt 185 lb 12.8 oz (84.3 kg)   BMI 29.10 kg/m  Wt Readings from Last 3 Encounters:  04/06/20 185 lb 12.8 oz (84.3 kg)  03/06/20 186 lb (84.4 kg)  12/12/19 187 lb (84.8 kg)     Health Maintenance Due  Topic Date Due  . FOOT EXAM  Never done  . TETANUS/TDAP  Never done  . PNA vac Low Risk Adult (1 of 2 - PCV13) Never done  . INFLUENZA VACCINE  03/25/2020    There are no preventive care reminders to display for this patient.  Lab Results  Component Value Date   TSH 2.690 12/01/2019   Lab Results  Component Value Date   WBC 7.6 04/04/2020   HGB 13.9 04/04/2020   HCT 41.7 04/04/2020   MCV 89 04/04/2020   PLT 235 04/04/2020   Lab Results  Component Value Date   NA 140 04/04/2020   K 4.8 04/04/2020   CO2 25 04/04/2020   GLUCOSE 107 (H) 04/04/2020   BUN 16 04/04/2020    CREATININE 1.17 04/04/2020   BILITOT 0.9 04/04/2020   ALKPHOS 136 (H) 04/04/2020   AST 19 04/04/2020   ALT 15 04/04/2020   PROT 6.3 04/04/2020   ALBUMIN 4.2 04/04/2020   CALCIUM 9.1 04/04/2020   ANIONGAP 8 04/20/2019   Lab Results  Component Value Date   CHOL 168 04/04/2020   Lab Results  Component Value Date   HDL 43 04/04/2020   Lab Results  Component Value Date   LDLCALC 111 (H) 04/04/2020   Lab Results  Component Value Date   TRIG 76 04/04/2020   Lab Results  Component Value Date   CHOLHDL 3.9 04/04/2020   Lab Results  Component Value Date   HGBA1C 5.5 04/04/2020      Assessment & Plan:  1. Essential hypertension The current medical regimen is effective;  continue present plan and medications.  2. 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 Recommend check feet daily. Recommend annual eye exams. Medicines: stop glipizide. Increase gabapentin to 400 mg one three times a day Continue to work on eating a healthy diet and exercise.    3. Mixed hyperlipidemia Poorly controlled.  No changes to medicines.  Continue to work on eating a healthy diet and exercise.   Follow-up: No follow-ups on file.    [name], MD

## 2020-04-05 ENCOUNTER — Encounter: Payer: Self-pay | Admitting: Family Medicine

## 2020-04-05 LAB — COMPREHENSIVE METABOLIC PANEL
ALT: 15 IU/L (ref 0–44)
AST: 19 IU/L (ref 0–40)
Albumin/Globulin Ratio: 2 (ref 1.2–2.2)
Albumin: 4.2 g/dL (ref 3.6–4.6)
Alkaline Phosphatase: 136 IU/L — ABNORMAL HIGH (ref 48–121)
BUN/Creatinine Ratio: 14 (ref 10–24)
BUN: 16 mg/dL (ref 8–27)
Bilirubin Total: 0.9 mg/dL (ref 0.0–1.2)
CO2: 25 mmol/L (ref 20–29)
Calcium: 9.1 mg/dL (ref 8.6–10.2)
Chloride: 103 mmol/L (ref 96–106)
Creatinine, Ser: 1.17 mg/dL (ref 0.76–1.27)
GFR calc Af Amer: 67 mL/min/{1.73_m2} (ref >59)
GFR calc non Af Amer: 58 mL/min/{1.73_m2} — ABNORMAL LOW (ref >59)
Globulin, Total: 2.1 g/dL (ref 1.5–4.5)
Glucose: 107 mg/dL — ABNORMAL HIGH (ref 65–99)
Potassium: 4.8 mmol/L (ref 3.5–5.2)
Sodium: 140 mmol/L (ref 134–144)
Total Protein: 6.3 g/dL (ref 6.0–8.5)

## 2020-04-05 LAB — CBC WITH DIFFERENTIAL/PLATELET
Basophils Absolute: 0 10*3/uL (ref 0.0–0.2)
Basos: 1 %
EOS (ABSOLUTE): 0.3 10*3/uL (ref 0.0–0.4)
Eos: 4 %
Hematocrit: 41.7 % (ref 37.5–51.0)
Hemoglobin: 13.9 g/dL (ref 13.0–17.7)
Immature Grans (Abs): 0 10*3/uL (ref 0.0–0.1)
Immature Granulocytes: 0 %
Lymphocytes Absolute: 2.2 10*3/uL (ref 0.7–3.1)
Lymphs: 29 %
MCH: 29.6 pg (ref 26.6–33.0)
MCHC: 33.3 g/dL (ref 31.5–35.7)
MCV: 89 fL (ref 79–97)
Monocytes Absolute: 0.7 10*3/uL (ref 0.1–0.9)
Monocytes: 9 %
Neutrophils Absolute: 4.3 10*3/uL (ref 1.4–7.0)
Neutrophils: 57 %
Platelets: 235 10*3/uL (ref 150–450)
RBC: 4.69 x10E6/uL (ref 4.14–5.80)
RDW: 12.6 % (ref 11.6–15.4)
WBC: 7.6 10*3/uL (ref 3.4–10.8)

## 2020-04-05 LAB — LIPID PANEL
Chol/HDL Ratio: 3.9 ratio (ref 0.0–5.0)
Cholesterol, Total: 168 mg/dL (ref 100–199)
HDL: 43 mg/dL (ref >39)
LDL Chol Calc (NIH): 111 mg/dL — ABNORMAL HIGH (ref 0–99)
Triglycerides: 76 mg/dL (ref 0–149)
VLDL Cholesterol Cal: 14 mg/dL (ref 5–40)

## 2020-04-05 LAB — CARDIOVASCULAR RISK ASSESSMENT

## 2020-04-05 LAB — HEMOGLOBIN A1C
Est. average glucose Bld gHb Est-mCnc: 111 mg/dL
Hgb A1c MFr Bld: 5.5 % (ref 4.8–5.6)

## 2020-04-06 ENCOUNTER — Other Ambulatory Visit: Payer: Self-pay

## 2020-04-06 ENCOUNTER — Ambulatory Visit (INDEPENDENT_AMBULATORY_CARE_PROVIDER_SITE_OTHER): Payer: Medicare HMO | Admitting: Family Medicine

## 2020-04-06 VITALS — BP 120/60 | HR 72 | Temp 97.3°F | Resp 16 | Ht 67.0 in | Wt 185.8 lb

## 2020-04-06 DIAGNOSIS — E782 Mixed hyperlipidemia: Secondary | ICD-10-CM

## 2020-04-06 DIAGNOSIS — I10 Essential (primary) hypertension: Secondary | ICD-10-CM

## 2020-04-06 DIAGNOSIS — E1142 Type 2 diabetes mellitus with diabetic polyneuropathy: Secondary | ICD-10-CM | POA: Diagnosis not present

## 2020-04-06 MED ORDER — GABAPENTIN 400 MG PO CAPS
400.0000 mg | ORAL_CAPSULE | Freq: Three times a day (TID) | ORAL | 0 refills | Status: DC
Start: 2020-04-06 — End: 2020-07-12

## 2020-04-08 ENCOUNTER — Encounter: Payer: Self-pay | Admitting: Family Medicine

## 2020-04-13 IMAGING — CR CHEST - 2 VIEW
2 series · 2 of 2 positions shown · non-contrast
Comparison: CT 04/01/2019.  Chest x-ray 04/01/2019.

CLINICAL DATA: Fever.  Weakness.  Dizziness.

EXAM:
CHEST - 2 VIEW

[chest lat]
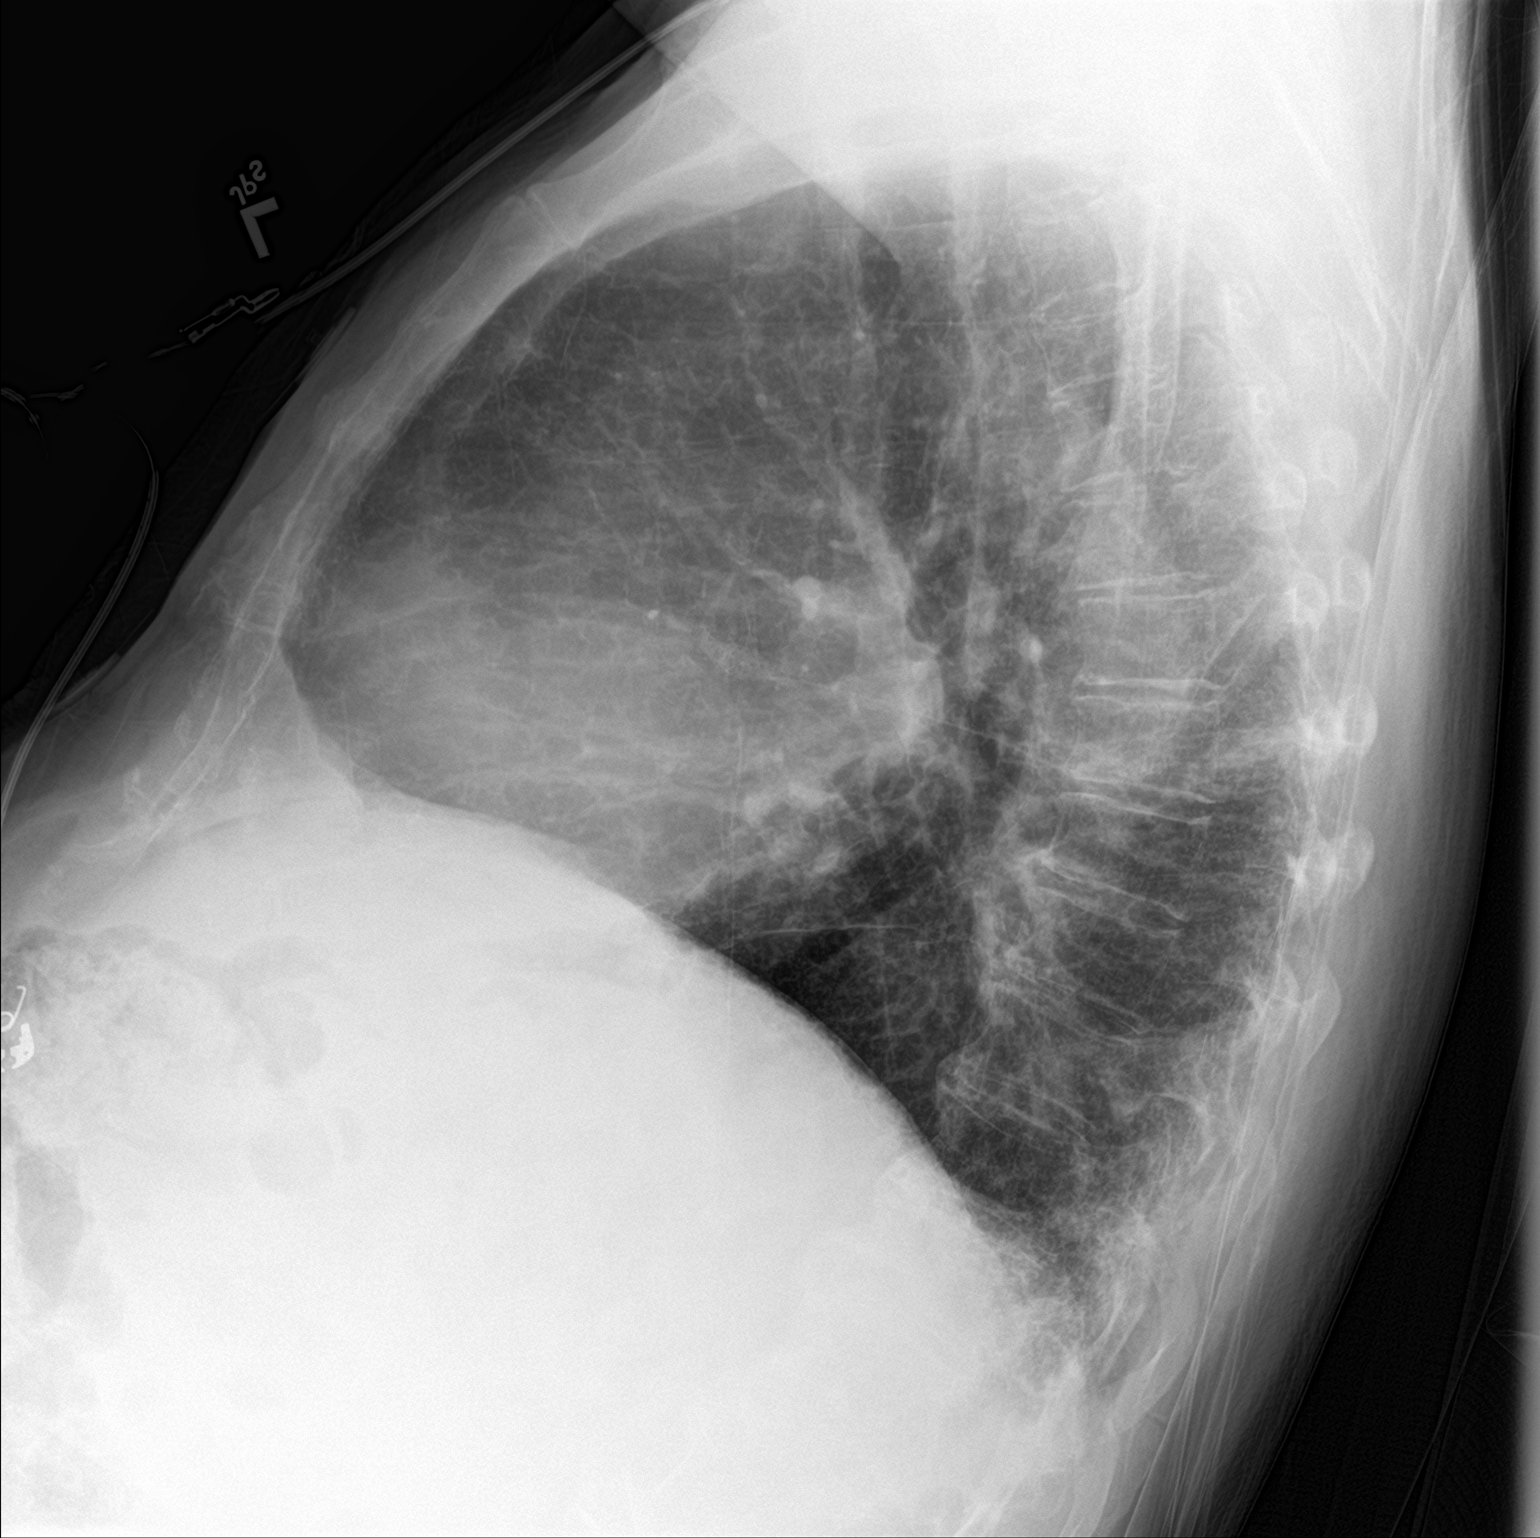

[chest ap]
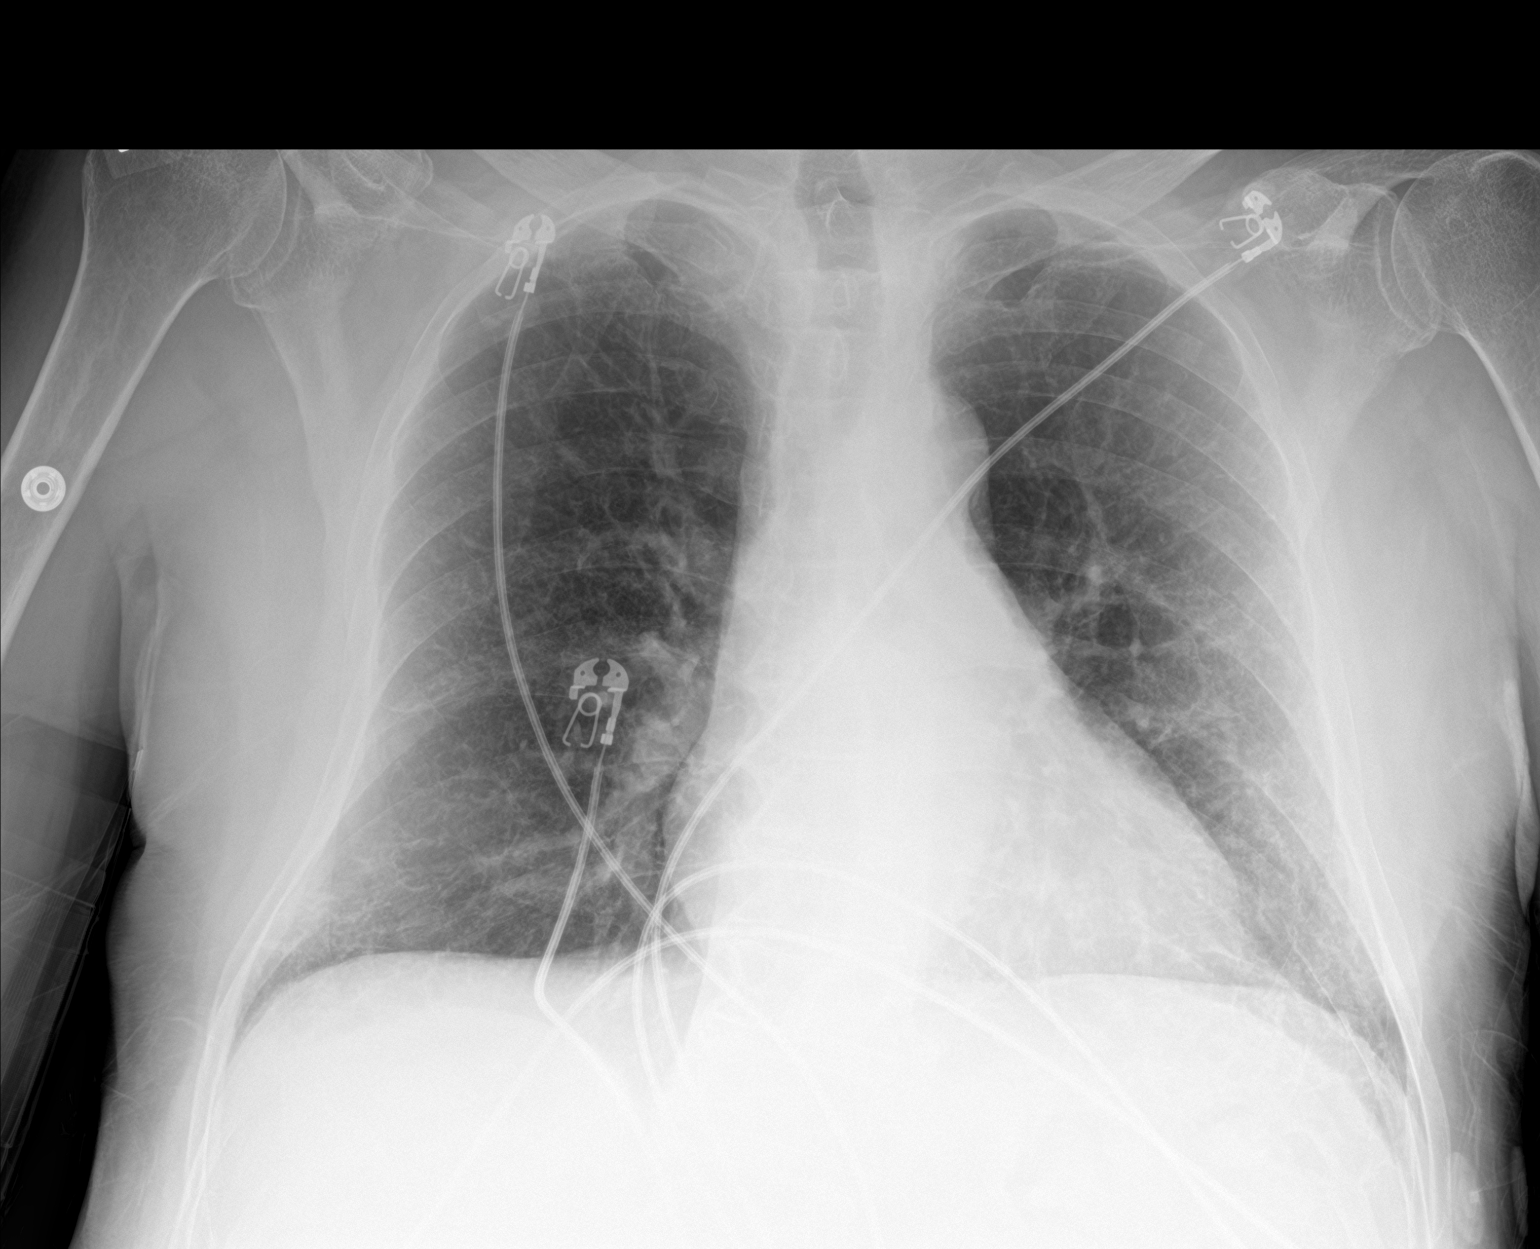

[2 of 2 positions shown; findings below may reference images not displayed]

FINDINGS: Prominence of the anterior mediastinum is stable and consistent with
prominent mediastinal fat as noted on prior CT. Hilar structures
normal. Stable bilateral mild interstitial prominence consistent
chronic interstitial lung disease again noted. Lungs are clear of
acute infiltrates r. No pleural effusion or pneumothorax.
Cardiomegaly with normal pulmonary vascularity. Degenerative changes
thoracic spine.
IMPRESSION: No acute cardiopulmonary disease.

## 2020-05-15 ENCOUNTER — Telehealth: Payer: Self-pay | Admitting: Family Medicine

## 2020-05-15 NOTE — Chronic Care Management (AMB) (Signed)
  Chronic Care Management   Note  05/15/2020 Name: Trinton Prewitt MRN: [id number] DOB: 10-11-1935  Dejuan Elman is a 84 y.o. year old male who is a primary care patient of Cox, Kirsten, MD. I reached out to [name] by phone today in response to a referral sent by Mr. Arlyn Leak PCP, Cox, Kirsten, MD.   Mr. Demarest was given information about Chronic Care Management services today including:  1. CCM service includes personalized support from designated clinical staff supervised by his physician, including individualized plan of care and coordination with other care providers 2. 24/7 contact phone numbers for assistance for urgent and routine care needs. 3. Service will only be billed when office clinical staff spend 20 minutes or more in a month to coordinate care. 4. Only one practitioner may furnish and bill the service in a calendar month. 5. The patient may stop CCM services at any time (effective at the end of the month) by phone call to the office staff.   Patient agreed to services and verbal consent obtained.   Follow up plan:   [place]

## 2020-06-04 DIAGNOSIS — J84112 Idiopathic pulmonary fibrosis: Secondary | ICD-10-CM | POA: Diagnosis not present

## 2020-06-04 DIAGNOSIS — Z6828 Body mass index (BMI) 28.0-28.9, adult: Secondary | ICD-10-CM | POA: Diagnosis not present

## 2020-06-04 DIAGNOSIS — M317 Microscopic polyangiitis: Secondary | ICD-10-CM | POA: Diagnosis not present

## 2020-06-04 DIAGNOSIS — R768 Other specified abnormal immunological findings in serum: Secondary | ICD-10-CM | POA: Diagnosis not present

## 2020-06-04 DIAGNOSIS — E663 Overweight: Secondary | ICD-10-CM | POA: Diagnosis not present

## 2020-06-04 DIAGNOSIS — M791 Myalgia, unspecified site: Secondary | ICD-10-CM | POA: Diagnosis not present

## 2020-06-04 DIAGNOSIS — R5383 Other fatigue: Secondary | ICD-10-CM | POA: Diagnosis not present

## 2020-06-05 ENCOUNTER — Ambulatory Visit (INDEPENDENT_AMBULATORY_CARE_PROVIDER_SITE_OTHER): Payer: Medicare Other

## 2020-06-05 DIAGNOSIS — Z23 Encounter for immunization: Secondary | ICD-10-CM | POA: Diagnosis not present

## 2020-06-05 NOTE — Chronic Care Management (AMB) (Signed)
Chronic Care Management Pharmacy  Name: Kerry Matthews  MRN: [id number] DOB: 1936-04-26  Chief Complaint/ HPI  Kerry Matthews,  84 y.o. , male presents for their Initial CCM visit with the clinical pharmacist In office.  PCP : Kerry Brome, MD  Their chronic conditions include: hypertension, atherosclerotic heart disease without angina, GERD, dyslipidemia, diabetes type 2, emphysema, insomina.   Office Visits: 04/06/2020 - stop glipizide. Increase gabapentin 400 mg tid.  03/06/2020 - atorvastatin 20 mg prescribed. Heating pad and tylenol 5000 mg recommended for back pain.  12/12/2019 - PCP - prescription for voltaren gel and stop aleve. May use tylenol. May take extra half dose of glipizide before supper. Work on diet and avoiding sugar.  Consult Visit: 06/04/2020 - Rheumatology - recommended third covid vaccine.  02/29/2020 - ENT - thyroid nodule ultrasound reviewed. No further action at this time.  01/31/2020 - Rheumatology - tapered off of prednisone. Continue Rituximab.  01/19/2020 - Rheumatology - methylprednisolone injection.  01/05/2020 - Rheumatology - Rituximab injection.  01/05/2020 - Neurosurgery - gabapentin and nerve conduction test.  12/22/2019 - Orthopedic surgery -fracture in knee noted.  DJD unloader brace. Prescription for tramadol to use for pain.Ondansetron prn nausea.   12/20/2019 - diabetes eye visit. Negative for retinopathy.  Medications: Outpatient Encounter Medications as of 06/07/2020  Medication Sig  . aspirin 81 MG EC tablet Take 81 mg by mouth daily.   Marland Kitchen atorvastatin (LIPITOR) 20 MG tablet Take 1 tablet (20 mg total) by mouth daily.  . Coenzyme Q10 (CO Q 10) 10 MG CAPS Take 30 mg by mouth daily.  . furosemide (LASIX) 40 MG tablet Take 1/2 (one-half) tablet by mouth once daily  . gabapentin (NEURONTIN) 400 MG capsule Take 1 capsule (400 mg total) by mouth 3 (three) times daily.  Marland Kitchen glucose blood (TRUE METRIX BLOOD GLUCOSE TEST) test strip TEST BLOOD  SUGAR THREE TIMES DAILY BEFORE MEALS  . losartan (COZAAR) 50 MG tablet TAKE 1 TABLET EVERY DAY (Patient taking differently: 25 mg daily. )  . Multiple Vitamins-Minerals (EYE VITAMINS PO) Take by mouth daily.  . nitroGLYCERIN (NITROSTAT) 0.4 MG SL tablet Place 0.4 mg under the tongue every 5 (five) minutes as needed.  . Omega-3 Fatty Acids (FISH OIL) 1000 MG CAPS Take 1,000 mg by mouth 2 (two) times daily.   Marland Kitchen omeprazole (PRILOSEC) 20 MG capsule Take 20 mg by mouth daily.  . vitamin B-12 (CYANOCOBALAMIN) 1000 MCG tablet Take 1 tablet (1,000 mcg total) by mouth daily.  Marland Kitchen glipiZIDE (GLUCOTROL) 5 MG tablet TAKE 1 TABLET TWICE DAILY (Patient not taking: Reported on 06/07/2020)   No facility-administered encounter medications on file as of 06/07/2020.   Allergies  Allergen Reactions  . Ace Inhibitors Other (See Comments)    Slow heart rate  . Hydrocodone Nausea And Vomiting  . Hydrocodone-Acetaminophen Nausea And Vomiting  . Sulfamethoxazole-Trimethoprim     Got sicker   SDOH Screenings   Alcohol Screen:   . Last Alcohol Screening Score (AUDIT): Not on file  Depression (PHQ2-9): Low Risk   . PHQ-2 Score: 0  Financial Resource Strain:   . Difficulty of Paying Living Expenses: Not on file  Food Insecurity: No Food Insecurity  . Worried About Charity fundraiser in the Last Year: Never true  . Ran Out of Food in the Last Year: Never true  Housing: Low Risk   . Last Housing Risk Score: 0  Physical Activity: Sufficiently Active  . Days of Exercise per Week: 5 days  .  Minutes of Exercise per Session: 30 min  Social Connections:   . Frequency of Communication with Friends and Family: Not on file  . Frequency of Social Gatherings with Friends and Family: Not on file  . Attends Religious Services: Not on file  . Active Member of Clubs or Organizations: Not on file  . Attends Archivist Meetings: Not on file  . Marital Status: Not on file  Stress:   . Feeling of Stress : Not on  file  Tobacco Use: Low Risk   . Smoking Tobacco Use: Never Smoker  . Smokeless Tobacco Use: Never Used  Transportation Needs: No Transportation Needs  . Lack of Transportation (Medical): No  . Lack of Transportation (Non-Medical): No     Current Diagnosis/Assessment:  Goals Addressed            This Visit's Progress   . Pharmacy Care Plan       CARE PLAN ENTRY (see longitudinal plan of care for additional care plan information)  Current Barriers:  . Chronic Disease Management support, education, and care coordination needs related to Hypertension, Hyperlipidemia, and Diabetes   Hypertension BP Readings from Last 3 Encounters:  04/06/20 120/60  03/06/20 118/60  12/12/19 122/64   . Pharmacist Clinical Goal(s): o Over the next 90 days, patient will work with PharmD and providers to maintain BP goal <130/80 . Current regimen:   Losartan 25 mg daily   Furosemide 20 mg daily  . Interventions: o Reviewed home diet and exercise.  o Reviewed medications.  . Patient self care activities - Over the next 90 days, patient will: o Check BP weekly, document, and provide at future appointments o Ensure daily salt intake < 2300 mg/day  Hyperlipidemia Lab Results  Component Value Date/Time   LDLCALC 111 (H) 04/04/2020 07:43 AM   . Pharmacist Clinical Goal(s): o Over the next 90 days, patient will work with PharmD and providers to achieve LDL goal < 100  . Current regimen:  . Aspirin EC 81 mg daily . Atorvastatin 20 mg daily . Nitroglycerin 0.4 mg every 5 minutes under tongue as needed . Coenzyme Q10 10 mg daily  . Omega-3 fatty acids bid . Interventions: o Reviewed medications.  o Discussed goal of improved cholesterol with addition of new medication.  . Patient self care activities - Over the next 90 days, patient will: o Continue to take medication as precriebed.  o Contact provider or pharmacist with any questions or concerns.  o Have updated lipid panel drawn in  November as scheduled.   Diabetes Lab Results  Component Value Date/Time   HGBA1C 5.5 04/04/2020 07:43 AM   HGBA1C 5.8 (H) 12/01/2019 09:04 AM   . Pharmacist Clinical Goal(s): o Over the next 90 days, patient will work with PharmD and providers to maintain A1c goal <7% . Current regimen:   Glucose test strips TID before meals . Interventions: o Discussed diet and exercise.  o Keep up the good work managing healthy blood sugar.  . Patient self care activities - Over the next 90 days, patient will: o Check blood sugar once daily, document, and provide at future appointments o Contact provider with any episodes of hypoglycemia  Medication management . Pharmacist Clinical Goal(s): o Over the next 90 days, patient will work with PharmD and providers to maintain optimal medication adherence . Current pharmacy: [facility] . Interventions o Comprehensive medication review performed. o Continue current medication management strategy . Patient self care activities - Over the next  90 days, patient will: o Focus on medication adherence by pill box o Take medications as prescribed o Report any questions or concerns to PharmD and/or provider(s)  Initial goal documentation        Diabetes   Recent Relevant Labs: Lab Results  Component Value Date/Time   HGBA1C 5.5 04/04/2020 07:43 AM   HGBA1C 5.8 (H) 12/01/2019 09:04 AM   MICROALBUR 10 03/06/2020 03:07 PM     Checking BG: Daily  Recent FBG Readings: 96, 117, 110, 107, 120 mg/dL  Patient has failed these meds in past: glipizide Patient is currently controlled on the following medications:   diet and lifestyle  glucose test strips TID before meals Last diabetic Foot exam: No results found for: [id number]  Last diabetic Eye exam: 11/2019 - no retinopathy  We discussed: diet and exercise extensively   Diet: Patient eats non-starchy vegetables, meat, apples. He works to keep diet in check and good controlled sugar.     Exercise: Patient stays active in yard at home and keeps his steps on his phone. He gets several thousand steps each day. 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   Sleep: Patient reports 6-7 hours of sleep each night without medication.   Plan  Continue control with diet and exercise   Hyperlipidemia   LDL goal < 100  Lipid Panel     Component Value Date/Time   CHOL 168 04/04/2020 0743   TRIG 76 04/04/2020 0743   HDL 43 04/04/2020 0743   LDLCALC 111 (H) 04/04/2020 0743    Hepatic Function Latest Ref Rng & Units 04/04/2020 12/01/2019 04/19/2019  Total Protein 6.0 - 8.5 g/dL 6.3 7.0 5.5(L)  Albumin 3.6 - 4.6 g/dL 4.2 4.3 2.4(L)  AST 0 - 40 IU/L _0 ALT 0 - 44 IU/L _1 Alk Phosphatase 48 - 121 IU/L 136(H) 116 93  Total Bilirubin 0.0 - 1.2 mg/dL 0.9 0.8 0.9  Bilirubin, Direct 0.0 - 0.2 mg/dL - - -     The ASCVD Risk score (Salamatof., et al., 2013) failed to calculate for the following reasons:   The 2013 ASCVD risk score is only valid for ages 4 to 71   Patient has failed these meds in past: none reported Patient is currently uncontrolled on the following medications:  . Aspirin EC 81 mg daily . Atorvastatin 20 mg daily . Nitroglycerin 0.4 mg every 5 minutes under tongue as needed - has never had to take  . Coenzyme Q10 10 mg daily  . Omega-3 fatty acids bid  We discussed:  diet and exercise extensively. Patient began atorvastatin in August. Will monitor next lipid panel to determine efficacy.   Plan  Continue current medications  Hypertension   BP today is:  <130/80  Office blood pressures are  BP Readings from Last 3 Encounters:  04/06/20 120/60  03/06/20 118/60  12/12/19 122/64    Patient has failed these meds in the past: none reported Patient is currently controlled on the following medications:   Losartan 25 mg daily   Furosemide 20 mg daily  Patient checks BP at home weekly  Patient home BP readings are ranging:  ~120/60  We discussed diet and exercise extensively  Plan  Continue current medications   GERD   Patient has failed these meds in past: none reported Patient is currently controlled on the following medications:  . Omeprazole 20 mg daily   We discussed:  Patient reports no  symptoms with current treatment. States medication is working well for him.   Plan  Continue current medications  Health Maintenance   Patient is currently controlled on the following medications:  Marland Kitchen Vitamin b-12 1000 mcg tablet daily - supplementation . Gabapentin 400 mg tid - pain . Multivitamin daily - supplementation .   We discussed:  Patient reports feeling good and denies pain.   Plan  Continue current medications  Vaccines   Reviewed and discussed patient's vaccination history.  Discussed vaccine status. Patient had third COVID vaccine in office this week. Patient has never taken the flu shot and doesn't want to start. Discussed importance with immune system.   Immunization History  Administered Date(s) Administered  . PFIZER SARS-COV-2 Vaccination 10/17/2019, 11/07/2019, 06/05/2020    Plan  Recommended patient receive annual flu vaccine in office - patient declines.   Medication Management   Pt uses [facility] mail order pharmacy for all medications Uses pill box? Yes Pt endorses 100% compliance  We discussed: Current pharmacy is preferred with insurance plan and patient is satisfied with pharmacy services  Plan  Continue current medication management strategy    Follow up: 6 month phone visit

## 2020-06-05 NOTE — Progress Notes (Signed)
   Covid-19 Vaccination Clinic  Name:  Kevontae Burgoon    MRN: [id number] DOB: [birth year]  06/05/2020  Mr. Langhorst was observed post Covid-19 immunization for 15 minutes without incident. He was provided with Vaccine Information Sheet and instruction to access the V-Safe system.   Mr. Germano was instructed to call 911 with any severe reactions post vaccine: Marland Kitchen Difficulty breathing  . Swelling of face and throat  . A fast heartbeat  . A bad rash all over body  . Dizziness and weakness

## 2020-06-07 ENCOUNTER — Other Ambulatory Visit: Payer: Self-pay

## 2020-06-07 ENCOUNTER — Ambulatory Visit: Payer: Medicare HMO

## 2020-06-07 DIAGNOSIS — I10 Essential (primary) hypertension: Secondary | ICD-10-CM

## 2020-06-07 DIAGNOSIS — E782 Mixed hyperlipidemia: Secondary | ICD-10-CM

## 2020-06-07 DIAGNOSIS — E1142 Type 2 diabetes mellitus with diabetic polyneuropathy: Secondary | ICD-10-CM

## 2020-06-07 NOTE — Patient Instructions (Addendum)
Visit Information  Thank you for your time discussing your medications. I look forward to working with you to achieve your health care goals. Below is a summary of what we talked about during our visit.   Goals Addressed            This Visit's Progress   . Pharmacy Care Plan       CARE PLAN ENTRY (see longitudinal plan of care for additional care plan information)  Current Barriers:  . Chronic Disease Management support, education, and care coordination needs related to Hypertension, Hyperlipidemia, and Diabetes   Hypertension BP Readings from Last 3 Encounters:  04/06/20 120/60  03/06/20 118/60  12/12/19 122/64   . Pharmacist Clinical Goal(s): o Over the next 90 days, patient will work with PharmD and providers to maintain BP goal <130/80 . Current regimen:   Losartan 25 mg daily   Furosemide 20 mg daily  . Interventions: o Reviewed home diet and exercise.  o Reviewed medications.  . Patient self care activities - Over the next 90 days, patient will: o Check BP weekly, document, and provide at future appointments o Ensure daily salt intake < 2300 mg/day  Hyperlipidemia Lab Results  Component Value Date/Time   LDLCALC 111 (H) 04/04/2020 07:43 AM   . Pharmacist Clinical Goal(s): o Over the next 90 days, patient will work with PharmD and providers to achieve LDL goal < 100  . Current regimen:  . Aspirin EC 81 mg daily . Atorvastatin 20 mg daily . Nitroglycerin 0.4 mg every 5 minutes under tongue as needed . Coenzyme Q10 10 mg daily  . Omega-3 fatty acids bid . Interventions: o Reviewed medications.  o Discussed goal of improved cholesterol with addition of new medication.  . Patient self care activities - Over the next 90 days, patient will: o Continue to take medication as precriebed.  o Contact provider or pharmacist with any questions or concerns.  o Have updated lipid panel drawn in November as scheduled.   Diabetes Lab Results  Component Value Date/Time    HGBA1C 5.5 04/04/2020 07:43 AM   HGBA1C 5.8 (H) 12/01/2019 09:04 AM   . Pharmacist Clinical Goal(s): o Over the next 90 days, patient will work with PharmD and providers to maintain A1c goal <7% . Current regimen:   Glucose test strips TID before meals . Interventions: o Discussed diet and exercise.  o Keep up the good work managing healthy blood sugar.  . Patient self care activities - Over the next 90 days, patient will: o Check blood sugar once daily, document, and provide at future appointments o Contact provider with any episodes of hypoglycemia  Medication management . Pharmacist Clinical Goal(s): o Over the next 90 days, patient will work with PharmD and providers to maintain optimal medication adherence . Current pharmacy: [facility] . Interventions o Comprehensive medication review performed. o Continue current medication management strategy . Patient self care activities - Over the next 90 days, patient will: o Focus on medication adherence by pill box o Take medications as prescribed o Report any questions or concerns to PharmD and/or provider(s)  Initial goal documentation        Kerry Matthews was given information about Chronic Care Management services today including:  1. CCM service includes personalized support from designated clinical staff supervised by his physician, including individualized plan of care and coordination with other care providers 2. 24/7 contact phone numbers for assistance for urgent and routine care needs. 3. Standard insurance, coinsurance, copays  and deductibles apply for chronic care management only during months in which we provide at least 20 minutes of these services. Most insurances cover these services at 100%, however patients may be responsible for any copay, coinsurance and/or deductible if applicable. This service may help you avoid the need for more expensive face-to-face services. 4. Only one practitioner may furnish  and bill the service in a calendar month. 5. The patient may stop CCM services at any time (effective at the end of the month) by phone call to the office staff.  Patient agreed to services and verbal consent obtained.   Print copy of patient instructions provided.  Telephone follow up appointment with pharmacy team member scheduled for: 11/2020  Sherre Poot, PharmD Clinical Pharmacist [facility] [phone] (office) [phone] (mobile)  Exercises To Do While Sitting  Exercises that you do while sitting (chair exercises) can give you many of the same benefits as full exercise. Benefits include strengthening your heart, burning calories, and keeping muscles and joints healthy. Exercise can also improve your mood and help with depression and anxiety. You may benefit from chair exercises if you are unable to do standing exercises because of:  Diabetic foot pain.  Obesity.  Illness.  Arthritis.  Recovery from surgery or injury.  Breathing problems.  Balance problems.  Another type of disability. Before starting chair exercises, check with your health care provider or a physical therapist to find out how much exercise you can tolerate and which exercises are safe for you. If your health care provider approves:  Start out slowly and build up over time. Aim to work up to about 10-20 minutes for each exercise session.  Make exercise part of your daily routine.  Drink water when you exercise. Do not wait until you are thirsty. Drink every 10-15 minutes.  Stop exercising right away if you have pain, nausea, shortness of breath, or dizziness.  If you are exercising in a wheelchair, make sure to lock the wheels.  Ask your health care provider whether you can do tai chi or yoga. Many positions in these mind-body exercises can be modified to do while seated. Warm-up Before starting other exercises: 1. Sit up as straight as you can. Have your knees bent at 90  degrees, which is the shape of the capital letter "L." Keep your feet flat on the floor. 2. Sit at the front edge of your chair, if you can. 3. Pull in (tighten) the muscles in your abdomen and stretch your spine and neck as straight as you can. Hold this position for a few minutes. 4. Breathe in and out evenly. Try to concentrate on your breathing, and relax your mind. Stretching Exercise A: Arm stretch 1. Hold your arms out straight in front of your body. 2. Bend your hands at the wrist with your fingers pointing up, as if signaling someone to stop. Notice the slight tension in your forearms as you hold the position. 3. Keeping your arms out and your hands bent, rotate your hands outward as far as you can and hold this stretch. Aim to have your thumbs pointing up and your pinkie fingers pointing down. Slowly repeat arm stretches for one minute as tolerated. Exercise B: Leg stretch 1. If you can move your legs, try to "draw" letters on the floor with the toes of your foot. Write your name with one foot. 2. Write your name with the toes of your other foot. Slowly repeat the movements for one minute as tolerated.  Exercise C: Reach for the sky 1. Reach your hands as far over your head as you can to stretch your spine. 2. Move your hands and arms as if you are climbing a rope. Slowly repeat the movements for one minute as tolerated. Range of motion exercises Exercise A: Shoulder roll 1. Let your arms hang loosely at your sides. 2. Lift just your shoulders up toward your ears, then let them relax back down. 3. When your shoulders feel loose, rotate your shoulders in backward and forward circles. Do shoulder rolls slowly for one minute as tolerated. Exercise B: March in place 1. As if you are marching, pump your arms and lift your legs up and down. Lift your knees as high as you can. ? If you are unable to lift your knees, just pump your arms and move your ankles and feet up and down. March in  place for one minute as tolerated. Exercise C: Seated jumping jacks 1. Let your arms hang down straight. 2. Keeping your arms straight, lift them up over your head. Aim to point your fingers to the ceiling. 3. While you lift your arms, straighten your legs and slide your heels along the floor to your sides, as wide as you can. 4. As you bring your arms back down to your sides, slide your legs back together. ? If you are unable to use your legs, just move your arms. Slowly repeat seated jumping jacks for one minute as tolerated. Strengthening exercises Exercise A: Shoulder squeeze 1. Hold your arms straight out from your body to your sides, with your elbows bent and your fists pointed at the ceiling. 2. Keeping your arms in the bent position, move them forward so your elbows and forearms meet in front of your face. 3. Open your arms back out as wide as you can with your elbows still bent, until you feel your shoulder blades squeezing together. Hold for 5 seconds. Slowly repeat the movements forward and backward for one minute as tolerated. Contact a health care provider if you:  Had to stop exercising due to any of the following: ? Pain. ? Nausea. ? Shortness of breath. ? Dizziness. ? Fatigue.  Have significant pain or soreness after exercising. Get help right away if you have:  Chest pain.  Difficulty breathing. These symptoms may represent a serious problem that is an emergency. Do not wait to see if the symptoms will go away. Get medical help right away. Call your local emergency services (911 in the [place] Do not drive yourself to the hospital. This information is not intended to replace advice given to you by your health care provider. Make sure you discuss any questions you have with your health care provider. Document Revised: 12/02/2018 Document Reviewed: 06/24/2017 Elsevier Patient Education  2020 [employer].

## 2020-07-04 ENCOUNTER — Ambulatory Visit (INDEPENDENT_AMBULATORY_CARE_PROVIDER_SITE_OTHER): Payer: Medicare HMO | Admitting: Family Medicine

## 2020-07-04 VITALS — BP 116/58 | HR 64 | Temp 97.6°F | Resp 16 | Ht 67.0 in | Wt 184.6 lb

## 2020-07-04 DIAGNOSIS — N3001 Acute cystitis with hematuria: Secondary | ICD-10-CM | POA: Diagnosis not present

## 2020-07-04 LAB — POCT URINALYSIS DIPSTICK
Bilirubin, UA: NEGATIVE
Glucose, UA: NEGATIVE
Ketones, UA: NEGATIVE
Nitrite, UA: NEGATIVE
Protein, UA: POSITIVE — AB
Spec Grav, UA: 1.015 (ref 1.010–1.025)
Urobilinogen, UA: 0.2 E.U./dL
pH, UA: 6 (ref 5.0–8.0)

## 2020-07-04 NOTE — Progress Notes (Signed)
Acute Office Visit  Subjective:    Patient ID: Kerry Matthews, male    DOB: [birth year], 84 y.o.   MRN: [id number]  Chief Complaint  Patient presents with  . Fever  . Dysuria  . swelling of ankles    HPI  Patient presents with fever, dysuria, and some swelling of ankles. He is having increased frequency of urine. NO back pain or abdominal pain. Mild confusion at times.  Past Medical History:  Diagnosis Date  . Bradycardia   . Coronary artery disease   . Diabetes ([place])   . GAD (generalized anxiety disorder)   . GERD (gastroesophageal reflux disease)   . Hyperlipidemia   . Hypertension   . Osteoarthritis   . Other emphysema ([place])   . Primary insomnia   . Renal stones   . Skin cancer   . UC (ulcerative colitis) ([place])   . UTI (urinary tract infection)     Past Surgical History:  Procedure Laterality Date  . ANGIOPLASTY    . BACK SURGERY    . CATARACT EXTRACTION    . COLONOSCOPY  06/16/2005   Mild colitis involving splenic flexure. Colonic polyps, status post polypectomy. Mild pancolonic diverticulitits. Internal hemorrhoids.   . ESOPHAGOGASTRODUODENOSCOPY  04/26/2003   Irregular Z line suggestive of GERD. Mild gastritis status post CLO testing.   Marland Kitchen TRIGGER FINGER RELEASE      Family History  Problem Relation Age of Onset  . Tuberculosis Mother   . Stroke Father   . Pancreatic cancer Sister   . Heart attack Sister   . Lung disease Sister   . Clotting disorder Brother   . Colon cancer Neg Hx   . Esophageal cancer Neg Hx     Social History   Socioeconomic History  . Marital status: Married    Spouse name: Not on file  . Number of children: 2  . Years of education: Not on file  . Highest education level: Not on file  Occupational History  . Occupation: retired  Tobacco Use  . Smoking status: Never Smoker  . Smokeless tobacco: Never Used  Vaping Use  . Vaping Use: Never used  Substance and Sexual Activity  . Alcohol use: Never  . Drug use: Never  .  Sexual activity: Not on file  Other Topics Concern  . Not on file  Social History Narrative  . Not on file   Social Determinants of Health   Financial Resource Strain:   . Difficulty of Paying Living Expenses: Not on file  Food Insecurity: No Food Insecurity  . Worried About Charity fundraiser in the Last Year: Never true  . Ran Out of Food in the Last Year: Never true  Transportation Needs: No Transportation Needs  . Lack of Transportation (Medical): No  . Lack of Transportation (Non-Medical): No  Physical Activity: Sufficiently Active  . Days of Exercise per Week: 5 days  . Minutes of Exercise per Session: 30 min  Stress:   . Feeling of Stress : Not on file  Social Connections:   . Frequency of Communication with Friends and Family: Not on file  . Frequency of Social Gatherings with Friends and Family: Not on file  . Attends Religious Services: Not on file  . Active Member of Clubs or Organizations: Not on file  . Attends Archivist Meetings: Not on file  . Marital Status: Not on file  Intimate Partner Violence:   . Fear of Current or Ex-Partner: Not on file  .  Emotionally Abused: Not on file  . Physically Abused: Not on file  . Sexually Abused: Not on file    No facility-administered medications prior to visit.   Outpatient Medications Prior to Visit  Medication Sig Dispense Refill  . aspirin 81 MG EC tablet Take 81 mg by mouth daily before breakfast.     . atorvastatin (LIPITOR) 20 MG tablet Take 1 tablet (20 mg total) by mouth daily. (Patient taking differently: Take 20 mg by mouth at bedtime. ) 90 tablet 1  . Coenzyme Q10 (CO Q 10) 100 MG CAPS Take 100 mg by mouth at bedtime.     . furosemide (LASIX) 40 MG tablet Take 1/2 (one-half) tablet by mouth once daily (Patient taking differently: Take 20 mg by mouth daily as needed (ankle swelling). ) 45 tablet 0  . gabapentin (NEURONTIN) 400 MG capsule Take 1 capsule (400 mg total) by mouth 3 (three) times daily.  (Patient taking differently: Take 400 mg by mouth See admin instructions. Take one tablet (400 mg) by mouth three times daily - after breakfast, after supper and at bedtime) 270 capsule 0  . losartan (COZAAR) 50 MG tablet TAKE 1 TABLET EVERY DAY (Patient taking differently: Take 25 mg by mouth daily after supper. ) 90 tablet 1  . nitroGLYCERIN (NITROSTAT) 0.4 MG SL tablet Place 0.4 mg under the tongue every 5 (five) minutes as needed for chest pain.     . Omega-3 Fatty Acids (FISH OIL) 1000 MG CAPS Take 1,000 mg by mouth 2 (two) times daily.     Marland Kitchen omeprazole (PRILOSEC) 20 MG capsule Take 20 mg by mouth daily before breakfast.     . vitamin B-12 (CYANOCOBALAMIN) 1000 MCG tablet Take 1 tablet (1,000 mcg total) by mouth daily. (Patient taking differently: Take 1,000 mcg by mouth daily before breakfast. ) 30 tablet 0  . Multiple Vitamins-Minerals (EYE VITAMINS PO) Take by mouth daily.    Marland Kitchen glucose blood (TRUE METRIX BLOOD GLUCOSE TEST) test strip TEST BLOOD SUGAR THREE TIMES DAILY BEFORE MEALS 300 strip 3  . glipiZIDE (GLUCOTROL) 5 MG tablet TAKE 1 TABLET TWICE DAILY (Patient not taking: Reported on 06/07/2020) 180 tablet 0    Allergies  Allergen Reactions  . Ace Inhibitors Other (See Comments)    Slow heart rate  . Hydrocodone Nausea And Vomiting  . Hydrocodone-Acetaminophen Nausea And Vomiting  . Sulfamethoxazole-Trimethoprim Nausea And Vomiting    Review of Systems  Constitutional: Positive for chills, fatigue and fever.  HENT: Negative for congestion, ear pain and sore throat.   Respiratory: Negative for cough and shortness of breath.   Cardiovascular: Positive for leg swelling. Negative for chest pain and palpitations.  Gastrointestinal: Positive for constipation and diarrhea. Negative for abdominal pain and nausea.  Genitourinary: Positive for decreased urine volume, dysuria and frequency.  Musculoskeletal: Positive for arthralgias. Negative for back pain and myalgias.  Neurological:  Positive for weakness. Negative for dizziness and headaches.  Psychiatric/Behavioral: Negative for dysphoric mood. The patient is not nervous/anxious.        Objective:    Physical Exam Vitals reviewed.  Constitutional:      Appearance: Normal appearance.  Cardiovascular:     Rate and Rhythm: Normal rate and regular rhythm.     Pulses: Normal pulses.     Heart sounds: Normal heart sounds.  Pulmonary:     Effort: Pulmonary effort is normal.     Breath sounds: Normal breath sounds. No wheezing, rhonchi or rales.  Abdominal:  General: Bowel sounds are normal.     Palpations: Abdomen is soft.     Tenderness: There is abdominal tenderness (suprapubic.). There is no right CVA tenderness or left CVA tenderness.  Neurological:     Mental Status: He is alert.  Psychiatric:        Mood and Affect: Mood normal.        Behavior: Behavior normal.     BP (!) 116/58   Pulse 64   Temp 97.6 F (36.4 C)   Resp 16   Ht 5\' 7"  (1.702 m)   Wt 184 lb 9.6 oz (83.7 kg)   BMI 28.91 kg/m  Wt Readings from Last 3 Encounters:  07/07/20 185 lb (83.9 kg)  07/04/20 184 lb 9.6 oz (83.7 kg)  04/06/20 185 lb 12.8 oz (84.3 kg)    Health Maintenance Due  Topic Date Due  . FOOT EXAM  Never done  . TETANUS/TDAP  Never done  . PNA vac Low Risk Adult (1 of 2 - PCV13) Never done  . INFLUENZA VACCINE  Never done    There are no preventive care reminders to display for this patient.   Lab Results  Component Value Date   TSH 2.690 12/01/2019   Lab Results  Component Value Date   WBC 16.7 (H) 07/08/2020   HGB 11.7 (L) 07/08/2020   HCT 35.2 (L) 07/08/2020   MCV 87.8 07/08/2020   PLT 185 07/08/2020   Lab Results  Component Value Date   NA 137 07/08/2020   K 3.9 07/08/2020   CO2 22 07/08/2020   GLUCOSE 141 (H) 07/08/2020   BUN 17 07/08/2020   CREATININE 1.25 (H) 07/08/2020   BILITOT 0.9 04/04/2020   ALKPHOS 136 (H) 04/04/2020   AST 19 04/04/2020   ALT 15 04/04/2020   PROT 6.3  04/04/2020   ALBUMIN 4.2 04/04/2020   CALCIUM 8.0 (L) 07/08/2020   ANIONGAP 8 07/08/2020   Lab Results  Component Value Date   CHOL 168 04/04/2020   Lab Results  Component Value Date   HDL 43 04/04/2020   Lab Results  Component Value Date   LDLCALC 111 (H) 04/04/2020   Lab Results  Component Value Date   TRIG 76 04/04/2020   Lab Results  Component Value Date   CHOLHDL 3.9 04/04/2020   Lab Results  Component Value Date   HGBA1C 5.7 (H) 07/08/2020       Assessment & Plan:  1. Acute cystitis with hematuria - Urine Culture - POCT urinalysis dipstick  Pt had cipro 500 mg in date. Recommended take once daily for 7 days.    Orders Placed This Encounter  Procedures  . Urine Culture  . POCT urinalysis dipstick    My nursing staff have aided in the documentation of this note on the behalf of [name], MD,as directed by  [name], MD and thoroughly reviewed by [name], MD.  Follow-up: No follow-ups on file.  An After Visit Summary was printed and given to the patient.  [name] [facility] [phone]

## 2020-07-07 ENCOUNTER — Inpatient Hospital Stay (HOSPITAL_COMMUNITY)
Admission: EM | Admit: 2020-07-07 | Discharge: 2020-07-09 | DRG: 872 | Disposition: A | Discharge status: Home / Self Care | Payer: Medicare HMO | Attending: Family Medicine | Admitting: Family Medicine

## 2020-07-07 ENCOUNTER — Encounter (HOSPITAL_COMMUNITY): Payer: Self-pay | Admitting: Emergency Medicine

## 2020-07-07 ENCOUNTER — Inpatient Hospital Stay (HOSPITAL_COMMUNITY): Payer: Medicare HMO

## 2020-07-07 ENCOUNTER — Other Ambulatory Visit: Payer: Self-pay

## 2020-07-07 DIAGNOSIS — Z888 Allergy status to other drugs, medicaments and biological substances status: Secondary | ICD-10-CM

## 2020-07-07 DIAGNOSIS — M549 Dorsalgia, unspecified: Secondary | ICD-10-CM | POA: Diagnosis present

## 2020-07-07 DIAGNOSIS — E1165 Type 2 diabetes mellitus with hyperglycemia: Secondary | ICD-10-CM | POA: Diagnosis present

## 2020-07-07 DIAGNOSIS — I959 Hypotension, unspecified: Secondary | ICD-10-CM | POA: Diagnosis present

## 2020-07-07 DIAGNOSIS — N390 Urinary tract infection, site not specified: Secondary | ICD-10-CM | POA: Diagnosis present

## 2020-07-07 DIAGNOSIS — Z8249 Family history of ischemic heart disease and other diseases of the circulatory system: Secondary | ICD-10-CM

## 2020-07-07 DIAGNOSIS — N3000 Acute cystitis without hematuria: Secondary | ICD-10-CM

## 2020-07-07 DIAGNOSIS — Z87442 Personal history of urinary calculi: Secondary | ICD-10-CM

## 2020-07-07 DIAGNOSIS — F411 Generalized anxiety disorder: Secondary | ICD-10-CM | POA: Diagnosis not present

## 2020-07-07 DIAGNOSIS — Z85828 Personal history of other malignant neoplasm of skin: Secondary | ICD-10-CM | POA: Diagnosis not present

## 2020-07-07 DIAGNOSIS — Z79899 Other long term (current) drug therapy: Secondary | ICD-10-CM

## 2020-07-07 DIAGNOSIS — A419 Sepsis, unspecified organism: Principal | ICD-10-CM

## 2020-07-07 DIAGNOSIS — G8929 Other chronic pain: Secondary | ICD-10-CM | POA: Diagnosis present

## 2020-07-07 DIAGNOSIS — N400 Enlarged prostate without lower urinary tract symptoms: Secondary | ICD-10-CM | POA: Diagnosis present

## 2020-07-07 DIAGNOSIS — K219 Gastro-esophageal reflux disease without esophagitis: Secondary | ICD-10-CM | POA: Diagnosis not present

## 2020-07-07 DIAGNOSIS — Z20822 Contact with and (suspected) exposure to covid-19: Secondary | ICD-10-CM | POA: Diagnosis not present

## 2020-07-07 DIAGNOSIS — Z885 Allergy status to narcotic agent status: Secondary | ICD-10-CM | POA: Diagnosis not present

## 2020-07-07 DIAGNOSIS — I119 Hypertensive heart disease without heart failure: Secondary | ICD-10-CM | POA: Diagnosis present

## 2020-07-07 DIAGNOSIS — J438 Other emphysema: Secondary | ICD-10-CM | POA: Diagnosis present

## 2020-07-07 DIAGNOSIS — I2510 Atherosclerotic heart disease of native coronary artery without angina pectoris: Secondary | ICD-10-CM | POA: Diagnosis present

## 2020-07-07 DIAGNOSIS — F5101 Primary insomnia: Secondary | ICD-10-CM | POA: Diagnosis present

## 2020-07-07 DIAGNOSIS — E782 Mixed hyperlipidemia: Secondary | ICD-10-CM | POA: Diagnosis present

## 2020-07-07 DIAGNOSIS — R6520 Severe sepsis without septic shock: Secondary | ICD-10-CM | POA: Diagnosis present

## 2020-07-07 DIAGNOSIS — N179 Acute kidney failure, unspecified: Secondary | ICD-10-CM | POA: Diagnosis not present

## 2020-07-07 DIAGNOSIS — Z7982 Long term (current) use of aspirin: Secondary | ICD-10-CM | POA: Diagnosis not present

## 2020-07-07 DIAGNOSIS — R7401 Elevation of levels of liver transaminase levels: Secondary | ICD-10-CM | POA: Insufficient documentation

## 2020-07-07 DIAGNOSIS — I10 Essential (primary) hypertension: Secondary | ICD-10-CM | POA: Diagnosis present

## 2020-07-07 DIAGNOSIS — Z882 Allergy status to sulfonamides status: Secondary | ICD-10-CM

## 2020-07-07 DIAGNOSIS — R509 Fever, unspecified: Secondary | ICD-10-CM | POA: Diagnosis not present

## 2020-07-07 HISTORY — DX: Sepsis, unspecified organism: A41.9

## 2020-07-07 HISTORY — DX: Severe sepsis without septic shock: R65.20

## 2020-07-07 HISTORY — DX: Urinary tract infection, site not specified: N39.0

## 2020-07-07 LAB — CBC
HCT: 41.3 % (ref 39.0–52.0)
Hemoglobin: 13.5 g/dL (ref 13.0–17.0)
MCH: 28.8 pg (ref 26.0–34.0)
MCHC: 32.7 g/dL (ref 30.0–36.0)
MCV: 88.2 fL (ref 80.0–100.0)
Platelets: 259 10*3/uL (ref 150–400)
RBC: 4.68 MIL/uL (ref 4.22–5.81)
RDW: 13.1 % (ref 11.5–15.5)
WBC: 17.8 10*3/uL — ABNORMAL HIGH (ref 4.0–10.5)
nRBC: 0 % (ref 0.0–0.2)

## 2020-07-07 LAB — LACTIC ACID, PLASMA: Lactic Acid, Venous: 1.3 mmol/L (ref 0.5–1.9)

## 2020-07-07 LAB — URINE CULTURE

## 2020-07-07 LAB — URINALYSIS, MICROSCOPIC (REFLEX)
Squamous Epithelial / HPF: NONE SEEN (ref 0–5)
WBC, UA: >50 WBC/hpf (ref 0–5)

## 2020-07-07 LAB — BASIC METABOLIC PANEL
Anion gap: 12 (ref 5–15)
BUN: 24 mg/dL — ABNORMAL HIGH (ref 8–23)
CO2: 23 mmol/L (ref 22–32)
Calcium: 8.6 mg/dL — ABNORMAL LOW (ref 8.9–10.3)
Chloride: 101 mmol/L (ref 98–111)
Creatinine, Ser: 1.62 mg/dL — ABNORMAL HIGH (ref 0.61–1.24)
GFR, Estimated: 42 mL/min — ABNORMAL LOW (ref >60)
Glucose, Bld: 189 mg/dL — ABNORMAL HIGH (ref 70–99)
Potassium: 4 mmol/L (ref 3.5–5.1)
Sodium: 136 mmol/L (ref 135–145)

## 2020-07-07 LAB — URINALYSIS, ROUTINE W REFLEX MICROSCOPIC
Bilirubin Urine: NEGATIVE
Glucose, UA: NEGATIVE mg/dL
Ketones, ur: NEGATIVE mg/dL
Nitrite: NEGATIVE
Protein, ur: 100 mg/dL — AB
Specific Gravity, Urine: 1.02 (ref 1.005–1.030)
pH: 5 (ref 5.0–8.0)

## 2020-07-07 LAB — RESPIRATORY PANEL BY RT PCR (FLU A&B, COVID)
Influenza A by PCR: NEGATIVE
Influenza B by PCR: NEGATIVE
SARS Coronavirus 2 by RT PCR: NEGATIVE

## 2020-07-07 MED ORDER — LACTATED RINGERS IV BOLUS (SEPSIS): 1000.0000 mL | Freq: Once | INTRAVENOUS | Status: DC

## 2020-07-07 MED ORDER — SODIUM CHLORIDE 0.9 % IV BOLUS (SEPSIS)
1000.0000 mL | Freq: Once | INTRAVENOUS | Status: AC
  Administered 2020-07-07: 1000 mL via INTRAVENOUS

## 2020-07-07 MED ORDER — ONDANSETRON HCL 4 MG PO TABS: 4.0000 mg | ORAL_TABLET | Freq: Four times a day (QID) | ORAL | Status: DC | PRN

## 2020-07-07 MED ORDER — ATORVASTATIN CALCIUM 10 MG PO TABS
20.0000 mg | ORAL_TABLET | Freq: Every day | ORAL | Status: DC
  Administered 2020-07-08 – 2020-07-09 (×2): 20 mg via ORAL
  Filled 2020-07-07 (×2): qty 2

## 2020-07-07 MED ORDER — ASPIRIN EC 81 MG PO TBEC
81.0000 mg | DELAYED_RELEASE_TABLET | Freq: Every day | ORAL | Status: DC
  Administered 2020-07-08 – 2020-07-09 (×2): 81 mg via ORAL
  Filled 2020-07-07 (×2): qty 1

## 2020-07-07 MED ORDER — SODIUM CHLORIDE 0.9 % IV SOLN
1000.0000 mL | INTRAVENOUS | Status: DC
  Administered 2020-07-07: 1000 mL via INTRAVENOUS

## 2020-07-07 MED ORDER — SODIUM CHLORIDE 0.9 % IV SOLN
1000.0000 mL | INTRAVENOUS | Status: DC
  Administered 2020-07-07 – 2020-07-09 (×4): 1000 mL via INTRAVENOUS

## 2020-07-07 MED ORDER — SODIUM CHLORIDE 0.9 % IV SOLN: 1.0000 g | Freq: Once | INTRAVENOUS | Status: DC

## 2020-07-07 MED ORDER — SODIUM CHLORIDE 0.9 % IV SOLN
1.0000 g | INTRAVENOUS | Status: DC
  Administered 2020-07-07: 1 g via INTRAVENOUS
  Filled 2020-07-07: qty 10

## 2020-07-07 MED ORDER — LACTATED RINGERS IV BOLUS: 1000.0000 mL | Freq: Once | INTRAVENOUS | Status: DC

## 2020-07-07 MED ORDER — PANTOPRAZOLE SODIUM 40 MG PO TBEC
40.0000 mg | DELAYED_RELEASE_TABLET | Freq: Every day | ORAL | Status: DC
  Administered 2020-07-08 – 2020-07-09 (×2): 40 mg via ORAL
  Filled 2020-07-07 (×2): qty 1

## 2020-07-07 MED ORDER — ONDANSETRON HCL 4 MG/2ML IJ SOLN: 4.0000 mg | Freq: Four times a day (QID) | INTRAMUSCULAR | Status: DC | PRN

## 2020-07-07 MED ORDER — SODIUM CHLORIDE 0.9 % IV SOLN
2.0000 g | INTRAVENOUS | Status: DC
  Administered 2020-07-08: 2 g via INTRAVENOUS
  Filled 2020-07-07: qty 2
  Filled 2020-07-07: qty 20

## 2020-07-07 MED ORDER — ENOXAPARIN SODIUM 30 MG/0.3ML ~~LOC~~ SOLN
30.0000 mg | SUBCUTANEOUS | Status: DC
  Administered 2020-07-07 – 2020-07-08 (×2): 30 mg via SUBCUTANEOUS
  Filled 2020-07-07 (×2): qty 0.3

## 2020-07-07 MED ORDER — LACTATED RINGERS IV SOLN: INTRAVENOUS | Status: DC

## 2020-07-07 MED ORDER — VITAMIN B-12 1000 MCG PO TABS
1000.0000 ug | ORAL_TABLET | Freq: Every day | ORAL | Status: DC
  Administered 2020-07-08 – 2020-07-09 (×2): 1000 ug via ORAL
  Filled 2020-07-07 (×2): qty 1

## 2020-07-07 MED ORDER — CO Q 10 10 MG PO CAPS: 30.0000 mg | ORAL_CAPSULE | Freq: Every day | ORAL | Status: DC

## 2020-07-07 NOTE — ED Notes (Signed)
Dinner ordered 

## 2020-07-07 NOTE — ED Notes (Addendum)
Pt transported to Ultrasound.  

## 2020-07-07 NOTE — H&P (Signed)
History and Physical        Hospital Admission Note Date: 07/07/2020  Patient name: Kerry Matthews Medical record number: [id number] Date of birth: 1935-12-27 Age: 84 y.o. Gender: male  PCP: Cox, Kirsten, MD    Patient coming from: home   I have reviewed all records in the [place].    Chief Complaint:  Fevers with chills, dysuria  HPI: Patient is 84 year old male with history of prior UTIs, hypertension,  kidney stones, chronic back pain, presented to ED with fevers, chills, low BP, dysuria.  History was obtained from the patient and wife at the bedside, reported that the symptoms started on Tuesday 5 days ago with dysuria, frequent urination, weakness.  He also had fevers 100-101 F with chills.  Patient went to his PCP, was diagnosed with UTI on 07/04/2020.  Patient reported that he was given an antibiotic.  His symptoms were not improving hence went to urgent care today and was suggested to come to the ED for further evaluation.  Patient has chronic back pain, no acute abdominal pain, nausea or vomiting.   ED work-up/course:  Temp 98.1, BP in ED initially low 89/62, now improving 117/77, having chills and shivering on examination Respiratory rate 22, O2 sats 100% on room air UA positive for UTI BUN 24, creatinine 1.6, creatinine was 1.1 on 04/04/2020 WBC 17.8, hemoglobin 13.5   Review of Systems: Positives marked in 'bold' Constitutional: +fever, chills, diaphoresis, poor appetite and fatigue.  HEENT: Denies photophobia, eye pain, redness, hearing loss, ear pain, congestion, sore throat, rhinorrhea, sneezing, mouth sores, trouble swallowing, neck pain, neck stiffness and tinnitus.   Respiratory: Denies SOB, DOE, cough, chest tightness,  and wheezing.   Cardiovascular: Denies chest pain, palpitations and leg swelling.  Gastrointestinal: Denies nausea, vomiting,  abdominal pain, diarrhea, constipation, blood in stool and abdominal distention.  Genitourinary: Please see HPI Musculoskeletal: Denies myalgias, joint swelling, arthralgias and gait problem.  Chronic back pain Skin: Denies pallor, rash and wound.  Neurological: Denies dizziness, seizures, syncope,  numbness and headaches. + Generalized weakness Hematological: Denies adenopathy. Easy bruising, personal or family bleeding history  Psychiatric/Behavioral: Denies suicidal ideation, mood changes, confusion, nervousness, sleep disturbance and agitation  Past Medical History: Past Medical History:  Diagnosis Date  . Bradycardia   . Coronary artery disease   . Diabetes ([place])   . GAD (generalized anxiety disorder)   . GERD (gastroesophageal reflux disease)   . Hyperlipidemia   . Hypertension   . Osteoarthritis   . Other emphysema ([place])   . Primary insomnia   . Renal stones   . Skin cancer   . UC (ulcerative colitis) ([place])   . UTI (urinary tract infection)     Past Surgical History:  Procedure Laterality Date  . ANGIOPLASTY    . BACK SURGERY    . CATARACT EXTRACTION    . COLONOSCOPY  06/16/2005   Mild colitis involving splenic flexure. Colonic polyps, status post polypectomy. Mild pancolonic diverticulitits. Internal hemorrhoids.   . ESOPHAGOGASTRODUODENOSCOPY  04/26/2003   Irregular Z line suggestive of GERD. Mild gastritis status post CLO testing.   Marland Kitchen TRIGGER FINGER RELEASE      Medications: Prior to  Admission medications   Medication Sig Start Date End Date Taking? Authorizing Provider  aspirin 81 MG EC tablet Take 81 mg by mouth daily.     [provider]  atorvastatin (LIPITOR) 20 MG tablet Take 1 tablet (20 mg total) by mouth daily. 03/06/20   Corum, Rex Kras, MD  Coenzyme Q10 (CO Q 10) 10 MG CAPS Take 30 mg by mouth daily.    [provider]  furosemide (LASIX) 40 MG tablet Take 1/2 (one-half) tablet by mouth once daily Patient taking differently: Take 20 mg  by mouth daily as needed. Take 1/2 (one-half) tablet by mouth once daily 09/29/19   Cox, Kirsten, MD  gabapentin (NEURONTIN) 400 MG capsule Take 1 capsule (400 mg total) by mouth 3 (three) times daily. 04/06/20   Cox, Elnita Maxwell, MD  glucose blood (TRUE METRIX BLOOD GLUCOSE TEST) test strip TEST BLOOD SUGAR THREE TIMES DAILY BEFORE MEALS 03/13/20   Cox, Kirsten, MD  losartan (COZAAR) 50 MG tablet TAKE 1 TABLET EVERY DAY Patient taking differently: 25 mg daily.  10/03/19   CoxElnita Maxwell, MD  Multiple Vitamins-Minerals (EYE VITAMINS PO) Take by mouth daily.    [provider]  nitroGLYCERIN (NITROSTAT) 0.4 MG SL tablet Place 0.4 mg under the tongue every 5 (five) minutes as needed.    [provider]  Omega-3 Fatty Acids (FISH OIL) 1000 MG CAPS Take 1,000 mg by mouth 2 (two) times daily.     [provider]  omeprazole (PRILOSEC) 20 MG capsule Take 20 mg by mouth daily.    [provider]  vitamin B-12 (CYANOCOBALAMIN) 1000 MCG tablet Take 1 tablet (1,000 mcg total) by mouth daily. 04/06/19   Geradine Girt, DO    Allergies:   Allergies  Allergen Reactions  . Ace Inhibitors Other (See Comments)    Slow heart rate  . Hydrocodone Nausea And Vomiting  . Hydrocodone-Acetaminophen Nausea And Vomiting  . Sulfamethoxazole-Trimethoprim     Got sicker    Social History:  reports that he has never smoked. He has never used smokeless tobacco. He reports that he does not drink alcohol and does not use drugs.  Family History: Family History  Problem Relation Age of Onset  . Tuberculosis Mother   . Stroke Father   . Pancreatic cancer Sister   . Heart attack Sister   . Lung disease Sister   . Clotting disorder Brother   . Colon cancer Neg Hx   . Esophageal cancer Neg Hx     Physical Exam: Blood pressure 117/77, pulse 78, temperature 98.1 F (36.7 C), temperature source Oral, resp. rate (!) 22, height 5\' 8"  (1.727 m), weight 83.9 kg, SpO2 100 %. General: Alert,  awake, oriented x3, in no acute distress, having chills and shivering. Eyes: pink conjunctiva,anicteric sclera, pupils equal and reactive to light and accomodation, HEENT: normocephalic, atraumatic, oropharynx clear Neck: supple, no masses or lymphadenopathy, no goiter, no bruits, no JVD CVS: Regular rate and rhythm, without murmurs, rubs or gallops. No lower extremity edema Resp : Clear to auscultation bilaterally, no wheezing, rales or rhonchi. GI : Soft, nontender, nondistended, positive bowel sounds, no masses. No hepatomegaly. No hernia.  Musculoskeletal: No clubbing or cyanosis, positive pedal pulses. No contracture. ROM intact  Neuro: Grossly intact, no focal neurological deficits, strength 5/5 upper and lower extremities bilaterally Psych: alert and oriented x 3, normal mood and affect Skin: no rashes or lesions, warm and dry   LABS on Admission: I have personally reviewed all the  labs and imagings below    Basic Metabolic Panel: Recent Labs  Lab 07/07/20 1349  NA 136  K 4.0  CL 101  CO2 23  GLUCOSE 189*  BUN 24*  CREATININE 1.62*  CALCIUM 8.6*   Liver Function Tests: No results for input(s): AST, ALT, ALKPHOS, BILITOT, PROT, ALBUMIN in the last 168 hours. No results for input(s): LIPASE, AMYLASE in the last 168 hours. No results for input(s): AMMONIA in the last 168 hours. CBC: Recent Labs  Lab 07/07/20 1349  WBC 17.8*  HGB 13.5  HCT 41.3  MCV 88.2  PLT 259   Cardiac Enzymes: No results for input(s): CKTOTAL, CKMB, CKMBINDEX, TROPONINI in the last 168 hours. BNP: Invalid input(s): POCBNP CBG: No results for input(s): GLUCAP in the last 168 hours.  Radiological Exams on Admission:  No results found.    EKG: Independently reviewed.  None available   Assessment/Plan Principal Problem:   Severe sepsis ([place]), secondary to acute UTI, present on admission -Patient presented with fevers, hypotension, leukocytosis, source secondary to UTI.  Lactic acid,  procalcitonin pending -Sepsis protocol initiated, patient placed on aggressive IV fluid hydration -Placed on IV Rocephin 2 g IV daily, follow urine culture and blood cultures -Patient has history of kidney stones, new AKI, possibly due to sepsis however rule out any infected or obstructed stone, obtain renal ultrasound  Active Problems: Acute kidney injury, POA -Creatinine 1.6 at the time of mission likely due to #1 and medications (losartan,Lasix) -History of kidney stones, will obtain renal ultrasound, continue IV fluid hydration  Hypotension (has history of essential hypertension) -BP currently low due to #1/sepsis, hold losartan, Lasix (patient takes Lasix as needed at home for peripheral edema however did take twice this week) -Placed on IV fluid hydration    Mixed hyperlipidemia -Continue Lipitor  Hyperglycemia -Blood glucose 189, not on any diabetes medications at home, obtain hemoglobin A1c    GERD (gastroesophageal reflux disease) -Continue PPI    DVT prophylaxis: Lovenox  CODE STATUS: Discussed in detail with the patient, wife at the bedside, requested full CODE STATUS  Consults called: None  Family Communication: Admission, patients condition and plan of care including tests being ordered have been discussed with the patient and wife  who indicates understanding and agree with the plan and Code Status  Admission status:   The medical decision making on this patient was of high complexity and the patient is at high risk for clinical deterioration, therefore this is a level 3 admission.  Severity of Illness:      The appropriate patient status for this patient is INPATIENT. Inpatient status is judged to be reasonable and necessary in order to provide the required intensity of service to ensure the patient's safety. The patient's presenting symptoms, physical exam findings, and initial radiographic and laboratory data in the context of their chronic comorbidities is felt  to place them at high risk for further clinical deterioration. Furthermore, it is not anticipated that the patient will be medically stable for discharge from the hospital within 2 midnights of admission. The following factors support the patient status of inpatient.   " The patient's presenting symptoms include sepsis, fevers, chills, acute UTI, acute kidney injury BP low. " The worrisome physical exam findings include having chills, shivering " The initial radiographic and laboratory data are worrisome because of acute kidney injury, sepsis, leukocytosis " The chronic co-morbidities include prior history of UTI, kidney stones, hypertension,   * I certify that at the point of admission  it is my clinical judgment that the patient will require inpatient hospital care spanning beyond 2 midnights from the point of admission due to high intensity of service, high risk for further deterioration and high frequency of surveillance required.*    Time Spent on Admission: 70 minutes     Babyboy Loya M.D. [facility] 07/07/2020, 3:51 PM

## 2020-07-07 NOTE — Progress Notes (Signed)
eLink is monitoring this sepsis. Thank you 

## 2020-07-07 NOTE — ED Notes (Signed)
Admitting paged to RN per her request 

## 2020-07-07 NOTE — ED Provider Notes (Signed)
[facility] EMERGENCY DEPARTMENT Provider Note   CSN: [id number] Arrival date & time: 07/07/20  1318     History Chief Complaint  Patient presents with  . bladder infection    Kerry Matthews is a 84 y.o. male.  HPI   Patient states he started having difficulty with urinary discomfort and frequency earlier this week.  He went to see his primary care doctor and was diagnosed with a urinary tract infection on November 10.  Patient states he was given an antibiotic.  Patient states he did not feel like he got any better.  He developed a fever in the last day.  He denies any vomiting or diarrhea.  No abdominal pain.  He went to an urgent care today and they suggested he come to the ED for further evaluation.  Past Medical History:  Diagnosis Date  . Bradycardia   . Coronary artery disease   . Diabetes ([place])   . GAD (generalized anxiety disorder)   . GERD (gastroesophageal reflux disease)   . Hyperlipidemia   . Hypertension   . Osteoarthritis   . Other emphysema ([place])   . Primary insomnia   . Renal stones   . Skin cancer   . UC (ulcerative colitis) ([place])   . UTI (urinary tract infection)     Patient Active Problem List   Diagnosis Date Noted  . Severe sepsis ([place]) 07/07/2020  . Acute lower UTI 07/07/2020  . Leukocytosis 03/08/2020  . Acute low back pain without sciatica 03/08/2020  . Acute pain of left knee 11/30/2019  . Paresthesias in left hand 11/30/2019  . Alpha-1-antitrypsin deficiency carrier 05/04/2019  . Sepsis ([place]) 04/19/2019  . Myalgia 04/19/2019  . History of alpha-1-antitrypsin deficiency 04/12/2019  . Elevated rheumatoid factor 04/12/2019  . Interstitial pulmonary disease ([place]) 04/12/2019  . Abnormal ANCA test 04/12/2019  . Abnormal findings on diagnostic imaging of lung 04/12/2019  . Transaminitis   . Hypoxemia   . Polymyositis ([place])   . Low vitamin B12 level 04/03/2019  . Community acquired pneumonia   . Pneumonitis   . Cough    . Fever   . Abnormal CT of the chest   . Lower extremity edema   . AKI (acute kidney injury) ([place])   . Anemia   . Pneumonia 04/01/2019  . Elevated LFTs 04/01/2019  . Diabetes mellitus type 2 in obese ([place]) 04/01/2019  . Dyslipidemia associated with type 2 diabetes mellitus ([place]) 05/03/2018  . Hypertension 05/03/2018  . Mixed hyperlipidemia 05/03/2018  . Atherosclerotic heart disease of native coronary artery without angina pectoris 05/03/2018  . GERD (gastroesophageal reflux disease) 05/03/2018  . Trigger finger 05/03/2018  . Bradycardia 05/03/2018  . CKD (chronic kidney disease) 05/03/2018  . Insomnia 05/03/2018  . Emphysema (subcutaneous) (surgical) resulting from a procedure 05/03/2018    Past Surgical History:  Procedure Laterality Date  . ANGIOPLASTY    . BACK SURGERY    . CATARACT EXTRACTION    . COLONOSCOPY  06/16/2005   Mild colitis involving splenic flexure. Colonic polyps, status post polypectomy. Mild pancolonic diverticulitits. Internal hemorrhoids.   . ESOPHAGOGASTRODUODENOSCOPY  04/26/2003   Irregular Z line suggestive of GERD. Mild gastritis status post CLO testing.   Marland Kitchen TRIGGER FINGER RELEASE         Family History  Problem Relation Age of Onset  . Tuberculosis Mother   . Stroke Father   . Pancreatic cancer Sister   . Heart attack Sister   . Lung disease Sister   .  Clotting disorder Brother   . Colon cancer Neg Hx   . Esophageal cancer Neg Hx     Social History   Tobacco Use  . Smoking status: Never Smoker  . Smokeless tobacco: Never Used  Vaping Use  . Vaping Use: Never used  Substance Use Topics  . Alcohol use: Never  . Drug use: Never    Home Medications Prior to Admission medications   Medication Sig Start Date End Date Taking? Authorizing Provider  aspirin 81 MG EC tablet Take 81 mg by mouth daily.     [provider]  atorvastatin (LIPITOR) 20 MG tablet Take 1 tablet (20 mg total) by mouth daily. 03/06/20   Corum, Rex Kras, MD   Coenzyme Q10 (CO Q 10) 10 MG CAPS Take 30 mg by mouth daily.    [provider]  furosemide (LASIX) 40 MG tablet Take 1/2 (one-half) tablet by mouth once daily Patient taking differently: Take 20 mg by mouth daily as needed. Take 1/2 (one-half) tablet by mouth once daily 09/29/19   Cox, Kirsten, MD  gabapentin (NEURONTIN) 400 MG capsule Take 1 capsule (400 mg total) by mouth 3 (three) times daily. 04/06/20   Cox, Elnita Maxwell, MD  glucose blood (TRUE METRIX BLOOD GLUCOSE TEST) test strip TEST BLOOD SUGAR THREE TIMES DAILY BEFORE MEALS 03/13/20   Cox, Kirsten, MD  losartan (COZAAR) 50 MG tablet TAKE 1 TABLET EVERY DAY Patient taking differently: 25 mg daily.  10/03/19   CoxElnita Maxwell, MD  Multiple Vitamins-Minerals (EYE VITAMINS PO) Take by mouth daily.    [provider]  nitroGLYCERIN (NITROSTAT) 0.4 MG SL tablet Place 0.4 mg under the tongue every 5 (five) minutes as needed.    [provider]  Omega-3 Fatty Acids (FISH OIL) 1000 MG CAPS Take 1,000 mg by mouth 2 (two) times daily.     [provider]  omeprazole (PRILOSEC) 20 MG capsule Take 20 mg by mouth daily.    [provider]  vitamin B-12 (CYANOCOBALAMIN) 1000 MCG tablet Take 1 tablet (1,000 mcg total) by mouth daily. 04/06/19   Geradine Girt, DO    Allergies    Ace inhibitors, Hydrocodone, Hydrocodone-acetaminophen, and Sulfamethoxazole-trimethoprim  Review of Systems   Review of Systems  All other systems reviewed and are negative.   Physical Exam Updated Vital Signs BP 117/77   Pulse 78   Temp 98.1 F (36.7 C) (Oral)   Resp (!) 22   Ht 1.727 m (5\' 8" )   Wt 83.9 kg   SpO2 100%   BMI 28.13 kg/m   Physical Exam Vitals and nursing note reviewed.  Constitutional:      General: He is not in acute distress.    Appearance: He is well-developed.  HENT:     Head: Normocephalic and atraumatic.     Right Ear: External ear normal.     Left Ear: External ear normal.  Eyes:     General: No  scleral icterus.       Right eye: No discharge.        Left eye: No discharge.     Conjunctiva/sclera: Conjunctivae normal.  Neck:     Trachea: No tracheal deviation.  Cardiovascular:     Rate and Rhythm: Normal rate and regular rhythm.  Pulmonary:     Effort: Pulmonary effort is normal. No respiratory distress.     Breath sounds: Normal breath sounds. No stridor. No wheezing or rales.  Abdominal:     General: Bowel sounds are  normal. There is no distension.     Palpations: Abdomen is soft.     Tenderness: There is no abdominal tenderness. There is no guarding or rebound.  Musculoskeletal:        General: No tenderness.     Cervical back: Neck supple.  Skin:    General: Skin is warm and dry.     Findings: No rash.  Neurological:     Mental Status: He is alert.     Cranial Nerves: No cranial nerve deficit (no facial droop, extraocular movements intact, no slurred speech).     Sensory: No sensory deficit.     Motor: No abnormal muscle tone or seizure activity.     Coordination: Coordination normal.     ED Results / Procedures / Treatments   Labs (all labs ordered are listed, but only abnormal results are displayed) Labs Reviewed  URINALYSIS, ROUTINE W REFLEX MICROSCOPIC - Abnormal; Notable for the following components:      Result Value   APPearance CLOUDY (*)    Hgb urine dipstick MODERATE (*)    Protein, ur 100 (*)    Leukocytes,Ua MODERATE (*)    All other components within normal limits  CBC - Abnormal; Notable for the following components:   WBC 17.8 (*)    All other components within normal limits  BASIC METABOLIC PANEL - Abnormal; Notable for the following components:   Glucose, Bld 189 (*)    BUN 24 (*)    Creatinine, Ser 1.62 (*)    Calcium 8.6 (*)    GFR, Estimated 42 (*)    All other components within normal limits  URINALYSIS, MICROSCOPIC (REFLEX) - Abnormal; Notable for the following components:   Bacteria, UA FEW (*)    All other components within  normal limits  URINE CULTURE  CULTURE, BLOOD (ROUTINE X 2)  CULTURE, BLOOD (ROUTINE X 2)  LACTIC ACID, PLASMA  PROCALCITONIN    EKG None  Radiology No results found.  Procedures .Critical Care Performed by: Dorie Rank, MD Authorized by: Dorie Rank, MD   Critical care provider statement:    Critical care time (minutes):  45   Critical care was time spent personally by me on the following activities:  Discussions with consultants, evaluation of patient's response to treatment, examination of patient, ordering and performing treatments and interventions, ordering and review of laboratory studies, ordering and review of radiographic studies, pulse oximetry, re-evaluation of patient's condition, obtaining history from patient or surrogate and review of old charts   (including critical care time)  Medications Ordered in ED Medications  sodium chloride 0.9 % bolus 1,000 mL (has no administration in time range)    Followed by  0.9 %  sodium chloride infusion (has no administration in time range)  lactated ringers infusion (has no administration in time range)  lactated ringers bolus 1,000 mL (has no administration in time range)    And  lactated ringers bolus 1,000 mL (has no administration in time range)    And  lactated ringers bolus 1,000 mL (has no administration in time range)  cefTRIAXone (ROCEPHIN) 1 g in sodium chloride 0.9 % 100 mL IVPB (has no administration in time range)  cefTRIAXone (ROCEPHIN) 1 g in sodium chloride 0.9 % 100 mL IVPB (has no administration in time range)    ED Course  I have reviewed the triage vital signs and the nursing notes.  Pertinent labs & imaging results that were available during my care of the patient were reviewed by  me and considered in my medical decision making (see chart for details).  Clinical Course as of Jul 07 1545  Sat Jul 07, 2020  1423 Patient's blood pressure noted to be low.  We will add on lactic acid level as well as blood  culture.  We will give a fluid bolus.   [FG]  9021 Patient's urinalysis is consistent with UTI.   [JD]  5520 Patient does have a significant leukocytosis.   [EY]  2233 Overall appears well however with his hypotension and sepsis is a concern.  Will initiate sepsis protocol   [JK]  1546 Repeat BP 130 at the bedside.   [JK]    Clinical Course User Index [JK] Dorie Rank, MD   MDM Rules/Calculators/A&P                          Patient presented to ED for evaluation of persistent symptoms associated recent UTI diagnosis.  Patient was noted to have low blood pressures on arrival.  He is afebrile and appears nontoxic however this certainly raises the concern of sepsis.  Repeat blood pressure was still low at 89/62.  Urinalysis shows a definite urinary tract infection.  Will initiate sepsis protocol with IV fluid bolus and check a lactic acid level.  Patient will require admission to the hospital for further treatment.  Will consult with the medical service.  Final Clinical Impression(s) / ED Diagnoses Final diagnoses:  Acute cystitis without hematuria  Sepsis without acute organ dysfunction, due to unspecified organism [facility])      Dorie Rank, MD 07/07/20 1547

## 2020-07-07 NOTE — ED Notes (Signed)
Unable to get IV access. Pt stuck 3 times. MD aware. IV team consult put in.

## 2020-07-07 NOTE — ED Triage Notes (Addendum)
Pt reports bladder infection that was diagnosed by PCP.  States he was sent to [facility] today and then sent to ED.  Reports pain with urination. BP 92/50

## 2020-07-07 NOTE — ED Notes (Signed)
Spoke with admitting about pt's BP and sepsis protocol. Pt was originally started on sepsis protocol because a BP of 80/52 was filed. This RN went in repositioned the the pt's BP, retook the BP and got a BP of 117/70. Pt is currently getting a liter bolus of NS. This RN contacted admitting to see if they still wanted to continue to give pt 3 more liter bags. Per Dr. Tana Coast, finish the liter bolus of NS. If BP is still a little low, low 256'L, 89'H systolic then to give 2nd bolus. If pt's BP is in the 110's-120's then just start continuous fluids. This RN will continue to monitor.

## 2020-07-08 LAB — BASIC METABOLIC PANEL
Anion gap: 8 (ref 5–15)
BUN: 17 mg/dL (ref 8–23)
CO2: 22 mmol/L (ref 22–32)
Calcium: 8 mg/dL — ABNORMAL LOW (ref 8.9–10.3)
Chloride: 107 mmol/L (ref 98–111)
Creatinine, Ser: 1.25 mg/dL — ABNORMAL HIGH (ref 0.61–1.24)
GFR, Estimated: 57 mL/min — ABNORMAL LOW (ref >60)
Glucose, Bld: 141 mg/dL — ABNORMAL HIGH (ref 70–99)
Potassium: 3.9 mmol/L (ref 3.5–5.1)
Sodium: 137 mmol/L (ref 135–145)

## 2020-07-08 LAB — CBC
HCT: 35.2 % — ABNORMAL LOW (ref 39.0–52.0)
Hemoglobin: 11.7 g/dL — ABNORMAL LOW (ref 13.0–17.0)
MCH: 29.2 pg (ref 26.0–34.0)
MCHC: 33.2 g/dL (ref 30.0–36.0)
MCV: 87.8 fL (ref 80.0–100.0)
Platelets: 185 10*3/uL (ref 150–400)
RBC: 4.01 MIL/uL — ABNORMAL LOW (ref 4.22–5.81)
RDW: 12.9 % (ref 11.5–15.5)
WBC: 16.7 10*3/uL — ABNORMAL HIGH (ref 4.0–10.5)
nRBC: 0 % (ref 0.0–0.2)

## 2020-07-08 LAB — HEMOGLOBIN A1C
Hgb A1c MFr Bld: 5.7 % — ABNORMAL HIGH (ref 4.8–5.6)
Mean Plasma Glucose: 116.89 mg/dL

## 2020-07-08 LAB — URINE CULTURE: Culture: <10000 — AB

## 2020-07-08 LAB — PROCALCITONIN: Procalcitonin: 0.35 ng/mL

## 2020-07-08 MED ORDER — ACETAMINOPHEN 325 MG PO TABS
650.0000 mg | ORAL_TABLET | Freq: Four times a day (QID) | ORAL | Status: DC | PRN
  Administered 2020-07-08: 650 mg via ORAL
  Filled 2020-07-08: qty 2

## 2020-07-08 NOTE — Progress Notes (Signed)
PROGRESS NOTE    Kerry Matthews  BMW:[id number] DOB: [birth year] DOA: 07/07/2020 PCP: [name], MD   Brief Narrative: This 84 years old male with history of prior UTI, hypertension, kidney stones, chronic back pain presented in the ED with complaints of fever, chills, dysuria.  Patient reports having the symptoms for 5 days,  He went to his PCP who diagnosed him UTI and started him on p.o. antibiotics.  Patient reports no improvement,  So he  went to urgent care and was sent to the ED.  He was hypotensive in the ED requiring IV fluids with leukocytosis. He is admitted for severe sepsis secondary to possible UTI, also found to have acute kidney injury recovering with IV fluids.   Assessment & Plan:   Principal Problem:   Severe sepsis ([place]) Active Problems:   Hypertension   Mixed hyperlipidemia   GERD (gastroesophageal reflux disease)   AKI (acute kidney injury) ([place])   Acute lower UTI   # Sepsis secondary to acute UTI, present on admission -He presented with fever, hypotension, leukocytosis, source secondary to UTI.  Lactic acid 1.3, procalcitonin 0.35 -Sepsis protocol initiated, patient placed on aggressive IV fluid hydration. -Continue IV Rocephin 2 g IV daily, follow urine culture and blood cultures. -Patient has history of kidney stones, new AKI, possibly due to sepsis however rule out any infected or obstructed stone,  -Renal ultrasound no obstruction, enlarged prostate    Acute kidney injury, POA: -Creatinine 1.6 at the time of admission likely due to #1 and medications (losartan,Lasix) -History of kidney stones, continue IV fluid hydration -Renal functions improving creatinine 1.25.  Hypotension (has history of essential hypertension) -BP was low at admission , blood pressure medications were held  -Placed on IV fluid hydration. -Blood pressure is improved . -will resume home blood pressure medication tomorrow.  Mixed hyperlipidemia -Continue  Lipitor  Hyperglycemia -Blood glucose 189, not on any diabetes medications at home,  - HbA1c 5.6  GERD (gastroesophageal reflux disease) -Continue PPI   DVT prophylaxis: Lovenox Code Status: Full code Family Communication: Wife was at bedside Disposition Plan:  Status is: Inpatient  Remains inpatient appropriate because:Inpatient level of care appropriate due to severity of illness   Dispo: The patient is from: Home              Anticipated d/c is to: SNF              Anticipated d/c date is: 2 days              Patient currently is not medically stable to d/c.   Consultants:   None  Procedures:   Antimicrobials:  Anti-infectives (From admission, onward)   Start     Dose/Rate Route Frequency Ordered Stop   07/08/20 1700  cefTRIAXone (ROCEPHIN) 2 g in sodium chloride 0.9 % 100 mL IVPB        2 g 200 mL/hr over 30 Minutes Intravenous Every 24 hours 07/07/20 1907     07/07/20 1545  cefTRIAXone (ROCEPHIN) 1 g in sodium chloride 0.9 % 100 mL IVPB  Status:  Discontinued        1 g 200 mL/hr over 30 Minutes Intravenous  Once 07/07/20 1543 07/07/20 1555   07/07/20 1530  cefTRIAXone (ROCEPHIN) 1 g in sodium chloride 0.9 % 100 mL IVPB  Status:  Discontinued        1 g 200 mL/hr over 30 Minutes Intravenous Every 24 hours 07/07/20 1519 07/07/20 1907      Subjective:  Patient was seen and examined at bedside, overnight events noted.   Patient was sitting on the chair,  reports feeling slightly better, denies any pain,  still reports having intermittent burning urination.  Objective: Vitals:   07/08/20 0900 07/08/20 1000 07/08/20 1100 07/08/20 1200  BP: 140/66 (!) 127/59 135/60 (!) 142/65  Pulse: 70 70 67 66  Resp: 18 16 (!) 24 17  Temp:  97.9 F (36.6 C)    TempSrc:  Oral    SpO2: 97% 96% 95% 99%  Weight:      Height:        Intake/Output Summary (Last 24 hours) at 07/08/2020 1216 Last data filed at 07/07/2020 1801 Gross per 24 hour  Intake 1128.44 ml  Output  --  Net 1128.44 ml   Filed Weights   07/07/20 1324  Weight: 83.9 kg    Examination:  General exam: Appears calm and comfortable,  Respiratory system: Clear to auscultation. Respiratory effort normal. Cardiovascular system: S1 & S2 heard, RRR. No JVD, murmurs, rubs, gallops or clicks. No pedal edema. Gastrointestinal system: Abdomen is nondistended, soft and nontender. No organomegaly or masses felt. Normal bowel sounds heard. Central nervous system: Alert and oriented. No focal neurological deficits. Extremities: No edema, no clubbing, no cyanosis Skin: No rashes, lesions or ulcers Psychiatry: Judgement and insight appear normal. Mood & affect appropriate.     Data Reviewed: I have personally reviewed following labs and imaging studies  CBC: Recent Labs  Lab 07/07/20 1349 07/08/20 0417  WBC 17.8* 16.7*  HGB 13.5 11.7*  HCT 41.3 35.2*  MCV 88.2 87.8  PLT 259 710   Basic Metabolic Panel: Recent Labs  Lab 07/07/20 1349 07/08/20 0417  NA 136 137  K 4.0 3.9  CL 101 107  CO2 23 22  GLUCOSE 189* 141*  BUN 24* 17  CREATININE 1.62* 1.25*  CALCIUM 8.6* 8.0*   GFR: Estimated Creatinine Clearance: 48.1 mL/min (A) (by C-G formula based on SCr of 1.25 mg/dL (H)). Liver Function Tests: No results for input(s): AST, ALT, ALKPHOS, BILITOT, PROT, ALBUMIN in the last 168 hours. No results for input(s): LIPASE, AMYLASE in the last 168 hours. No results for input(s): AMMONIA in the last 168 hours. Coagulation Profile: No results for input(s): INR, PROTIME in the last 168 hours. Cardiac Enzymes: No results for input(s): CKTOTAL, CKMB, CKMBINDEX, TROPONINI in the last 168 hours. BNP (last 3 results) No results for input(s): PROBNP in the last 8760 hours. HbA1C: Recent Labs    07/08/20 0417  HGBA1C 5.7*   CBG: No results for input(s): GLUCAP in the last 168 hours. Lipid Profile: No results for input(s): CHOL, HDL, LDLCALC, TRIG, CHOLHDL, LDLDIRECT in the last 72  hours. Thyroid Function Tests: No results for input(s): TSH, T4TOTAL, FREET4, T3FREE, THYROIDAB in the last 72 hours. Anemia Panel: No results for input(s): VITAMINB12, FOLATE, FERRITIN, TIBC, IRON, RETICCTPCT in the last 72 hours. Sepsis Labs: Recent Labs  Lab 07/07/20 1530 07/08/20 0417  PROCALCITON  --  0.35  LATICACIDVEN 1.3  --     Recent Results (from the past 240 hour(s))  Urine Culture     Status: Abnormal   Collection Time: 07/04/20 11:50 AM   Specimen: Urine   UR  Result Value Ref Range Status   Urine Culture, Routine Final report (A)  Final   Organism ID, Bacteria Escherichia coli (A)  Final    Comment: Cefazolin <=4 ug/mL Cefazolin with an MIC <=16 predicts susceptibility to the oral agents cefaclor, cefdinir, cefpodoxime,  cefprozil, cefuroxime, cephalexin, and loracarbef when used for therapy of uncomplicated urinary tract infections due to E. coli, Klebsiella pneumoniae, and Proteus mirabilis. Greater than 100,000 colony forming units per mL    ORGANISM ID, BACTERIA Comment  Final    Comment: Mixed urogenital flora 10,000-25,000 colony forming units per mL    Antimicrobial Susceptibility Comment  Final    Comment:       ** S = Susceptible; I = Intermediate; R = Resistant **                    P = Positive; N = Negative             MICS are expressed in micrograms per mL    Antibiotic                 RSLT#1    RSLT#2    RSLT#3    RSLT#4 Amoxicillin/Clavulanic Acid    S Ampicillin                     S Cefepime                       S Ceftriaxone                    S Cefuroxime                     S Ciprofloxacin                  R Ertapenem                      S Gentamicin                     S Imipenem                       S Levofloxacin                   R Meropenem                      S Nitrofurantoin                 S Piperacillin/Tazobactam        S Tetracycline                   S Tobramycin                     S Trimethoprim/Sulfa              R   Urine C&S     Status: Abnormal   Collection Time: 07/07/20  1:30 PM   Specimen: Urine, Clean Catch  Result Value Ref Range Status   Specimen Description URINE, CLEAN CATCH  Final   Special Requests NONE  Final   Culture (A)  Final    <10,000 COLONIES/mL INSIGNIFICANT GROWTH Performed at [facility], [address], [place], [place] [address]    Report Status 07/08/2020 FINAL  Final  Blood culture (routine x 2)     Status: None (Preliminary result)   Collection Time: 07/07/20  2:29 PM   Specimen: BLOOD  Result Value Ref Range Status   Specimen Description BLOOD BLOOD LEFT FOREARM  Final   Special Requests   Final  BOTTLES DRAWN AEROBIC AND ANAEROBIC Blood Culture adequate volume   Culture   Final    NO GROWTH < 24 HOURS Performed at [facility], [place] [address], [place], [place] [address]    Report Status PENDING  Incomplete  Respiratory Panel by RT PCR (Flu A&B, Covid) - Nasopharyngeal Swab     Status: None   Collection Time: 07/07/20  8:06 PM   Specimen: Nasopharyngeal Swab  Result Value Ref Range Status   SARS Coronavirus 2 by RT PCR NEGATIVE NEGATIVE Final    Comment: (NOTE) SARS-CoV-2 target nucleic acids are NOT DETECTED.  The SARS-CoV-2 RNA is generally detectable in upper respiratoy specimens during the acute phase of infection. The lowest concentration of SARS-CoV-2 viral copies this assay can detect is 131 copies/mL. A negative result does not preclude SARS-Cov-2 infection and should not be used as the sole basis for treatment or other patient management decisions. A negative result may occur with  improper specimen collection/handling, submission of specimen other than nasopharyngeal swab, presence of viral mutation(s) within the areas targeted by this assay, and inadequate number of viral copies (<131 copies/mL). A negative result must be combined with clinical observations, patient history, and epidemiological information. The expected  result is Negative.  Fact Sheet for Patients:  PinkCheek.be  Fact Sheet for Healthcare Providers:  GravelBags.it  This test is no t yet approved or cleared by the [place] FDA and  has been authorized for detection and/or diagnosis of SARS-CoV-2 by FDA under an Emergency Use Authorization (EUA). This EUA will remain  in effect (meaning this test can be used) for the duration of the COVID-19 declaration under Section 564(b)(1) of the Act, 21 U.S.C. section 360bbb-3(b)(1), unless the authorization is terminated or revoked sooner.     Influenza A by PCR NEGATIVE NEGATIVE Final   Influenza B by PCR NEGATIVE NEGATIVE Final    Comment: (NOTE) The Xpert Xpress SARS-CoV-2/FLU/RSV assay is intended as an aid in  the diagnosis of influenza from Nasopharyngeal swab specimens and  should not be used as a sole basis for treatment. Nasal washings and  aspirates are unacceptable for Xpert Xpress SARS-CoV-2/FLU/RSV  testing.  Fact Sheet for Patients: PinkCheek.be  Fact Sheet for Healthcare Providers: GravelBags.it  This test is not yet approved or cleared by the [place] FDA and  has been authorized for detection and/or diagnosis of SARS-CoV-2 by  FDA under an Emergency Use Authorization (EUA). This EUA will remain  in effect (meaning this test can be used) for the duration of the  Covid-19 declaration under Section 564(b)(1) of the Act, 21  U.S.C. section 360bbb-3(b)(1), unless the authorization is  terminated or revoked. Performed at [facility], [place] [address], [place], [place] [address]      Radiology Studies: US RENAL  Result Date: 07/07/2020 CLINICAL DATA:  Sepsis EXAM: RENAL / URINARY TRACT ULTRASOUND COMPLETE COMPARISON:  04/01/2019 FINDINGS: Right Kidney: Renal measurements: 10.2 x 4.3 x 4.3 cm = volume: 94.3 mL. Echogenicity within normal limits.  Mild renal cortical thinning. No mass or hydronephrosis. Left Kidney: Renal measurements: 12.9 x 5.9 x 5.0 cm = volume: 200 mL. Echogenicity within normal limits. Mild renal cortical thinning. No mass or hydronephrosis. Bladder: Bladder are minimally distended. Enlarged prostate causes mass effect on the posterior aspect of the bladder. Other: None. IMPRESSION: 1. Mild bilateral renal cortical thinning. Otherwise unremarkable appearance of the kidneys. 2. Enlarged prostate. Electronically Signed   By: Randa Ngo M.D.   On:  07/07/2020 16:41    Scheduled Meds: . aspirin EC  81 mg Oral Daily  . atorvastatin  20 mg Oral Daily  . enoxaparin (LOVENOX) injection  30 mg Subcutaneous Q24H  . pantoprazole  40 mg Oral Daily  . vitamin B-12  1,000 mcg Oral Daily   Continuous Infusions: . sodium chloride 1,000 mL (07/08/20 0416)  . cefTRIAXone (ROCEPHIN)  IV    . lactated ringers     And  . lactated ringers       LOS: 1 day    Time spent: 25 mins    Shawna Clamp, MD [facility]   If 7PM-7AM, please contact night-coverage

## 2020-07-08 NOTE — ED Notes (Signed)
SDU Breakfast Ordered 

## 2020-07-08 NOTE — Progress Notes (Signed)
Patient to room 4E01 from ED. Vital signs obtained. On monitor CCMD notified. CHG bath completed. Alert and oriented to room and call light. Call bell within reach.  Era Bumpers, RN

## 2020-07-08 NOTE — ED Notes (Signed)
Report called to floor. Wife and patient updated. Patient transported on stretcher with all belongings. PIV infusing. Patient with stable VS and no distress noted. Patient understanding reason for admission and has no questions at this time.

## 2020-07-08 NOTE — ED Notes (Signed)
Patient assisted to charge cell phone and recliner position changed. Wife remains at bedside at this time. Patient on all monitors as appropriate. Patient with no distress, and c/o generalized body aches from being in recliner and stretcher. Patient reports that he does have chronic pain in left knee and back.

## 2020-07-09 ENCOUNTER — Encounter: Payer: Self-pay | Admitting: Family Medicine

## 2020-07-09 LAB — BASIC METABOLIC PANEL
Anion gap: 7 (ref 5–15)
BUN: 15 mg/dL (ref 8–23)
CO2: 24 mmol/L (ref 22–32)
Calcium: 8.1 mg/dL — ABNORMAL LOW (ref 8.9–10.3)
Chloride: 106 mmol/L (ref 98–111)
Creatinine, Ser: 1.2 mg/dL (ref 0.61–1.24)
GFR, Estimated: >60 mL/min (ref >60)
Glucose, Bld: 129 mg/dL — ABNORMAL HIGH (ref 70–99)
Potassium: 3.7 mmol/L (ref 3.5–5.1)
Sodium: 137 mmol/L (ref 135–145)

## 2020-07-09 LAB — MAGNESIUM: Magnesium: 1.8 mg/dL (ref 1.7–2.4)

## 2020-07-09 LAB — CBC
HCT: 34.2 % — ABNORMAL LOW (ref 39.0–52.0)
Hemoglobin: 11.2 g/dL — ABNORMAL LOW (ref 13.0–17.0)
MCH: 28.6 pg (ref 26.0–34.0)
MCHC: 32.7 g/dL (ref 30.0–36.0)
MCV: 87.5 fL (ref 80.0–100.0)
Platelets: 205 10*3/uL (ref 150–400)
RBC: 3.91 MIL/uL — ABNORMAL LOW (ref 4.22–5.81)
RDW: 13 % (ref 11.5–15.5)
WBC: 12.9 10*3/uL — ABNORMAL HIGH (ref 4.0–10.5)
nRBC: 0 % (ref 0.0–0.2)

## 2020-07-09 LAB — PHOSPHORUS: Phosphorus: 2.4 mg/dL — ABNORMAL LOW (ref 2.5–4.6)

## 2020-07-09 MED ORDER — ENOXAPARIN SODIUM 40 MG/0.4ML ~~LOC~~ SOLN: 40.0000 mg | SUBCUTANEOUS | Status: DC

## 2020-07-09 MED ORDER — CEFDINIR 300 MG PO CAPS: 300.0000 mg | ORAL_CAPSULE | Freq: Two times a day (BID) | ORAL | 0 refills | Status: DC

## 2020-07-09 NOTE — Discharge Summary (Signed)
Physician Discharge Summary  Whitt Auletta ZHG:[id number] DOB: 1936-04-03 DOA: 07/07/2020  PCP: [name], MD  Admit date: 07/07/2020   Discharge date: 07/09/2020  Admitted From:  Home.  Disposition:  Home services  Recommendations for Outpatient Follow-up:  1. Follow up with PCP  Dr. Tobie Poet in 1-2 weeks. 2. Please obtain BMP/CBC in one week. 3. Advised to take omnicef twice daily for 5 days for UTI. 4. Follow up Blood cultures.  Home Health: Yes Home PT Equipment/Devices: None  Discharge Condition: Stable. CODE STATUS:Full code Diet recommendation: Heart Healthy   Brief Summary / Hospital course: This 84 years old male with history of prior UTI, hypertension, kidney stones, chronic back pain presented in the ED with complaints of fever, chills, dysuria. Patient reports having these symptoms for 5 days,  He went to see his PCP who diagnosed him with UTI and started him on p.o. antibiotics.  Patient reports no improvement,  So he went to urgent care and was sent to the ED.  He was hypotensive on arrival in the ED requiring IV fluids with leukocytosis. He was admitted for severe sepsis secondary to possible UTI, also found to have acute kidney injury recovering with IV fluids.  He was started on IV ceftriaxone 2 g.  Blood and urine cultures were sent.  Blood cultures showed no growth so far,  urine cultures unremarkable but a culture before hospitalization shows E. coli sensitive to ceftriaxone.  Patient reports feeling much better,  renal function has improved with IV fluids and back to baseline.  Patient participated in physical therapy,  recommended home with home PT.  Patient feels stronger and wants to be discharged.  Home health PT has been arranged and patient is being discharged,  Advised to follow-up with PCP and take Omnicef twice a day for 5 days to complete 7-day treatment.   He was managed for below problems.   Discharge Diagnoses:  Principal Problem:   Severe sepsis  ([place]) Active Problems:   Hypertension   Mixed hyperlipidemia   GERD (gastroesophageal reflux disease)   AKI (acute kidney injury) ([place])   Acute lower UTI  # Sepsis secondary to acute UTI, present on admission -He presented with fever, hypotension, leukocytosis, source secondary to UTI.Lactic acid 1.3, procalcitonin 0.35 -Sepsis protocol initiated, patient placed on aggressive IV fluid hydration. -Continue IV Rocephin 2 g IV daily,follow urine culture and blood cultures. -Patient has history of kidney stones, new AKI, possibly due to sepsis however rule out any infected or obstructed stone,  -Renal ultrasound no obstruction, enlarged prostate    Acute kidney injury, POA: >>> Resolved. -Creatinine 1.6 at the time of admission likely due to #1 and medications(losartan,Lasix) -History of kidney stones, continue IV fluid hydration -Renal functions improving creatinine 1.25.  Hypotension(has history of essential hypertension) -BP was low at admission , blood pressure medications were held  -Placed on IV fluid hydration. -Blood pressure is improved . -Resume home blood pressure medication  Mixed hyperlipidemia -Continue Lipitor  Hyperglycemia -Blood glucose 189, not on any diabetes medications at home,  - HbA1c 5.6  GERD (gastroesophageal reflux disease) -Continue PPI   Discharge Instructions  Discharge Instructions    Call MD for:  difficulty breathing, headache or visual disturbances   Complete by: As directed    Call MD for:  persistant dizziness or light-headedness   Complete by: As directed    Call MD for:  persistant nausea and vomiting   Complete by: As directed    Call MD for:  temperature >100.4   Complete by: As directed    Diet - low sodium heart healthy   Complete by: As directed    Diet Carb Modified   Complete by: As directed    Discharge instructions   Complete by: As directed    Advised to follow up PCP in one week. Advised to take omnicef  twice daily for 5 days for UTI.   Increase activity slowly   Complete by: As directed      Allergies as of 07/09/2020      Reactions   Ace Inhibitors Other (See Comments)   Slow heart rate   Hydrocodone Nausea And Vomiting   Hydrocodone-acetaminophen Nausea And Vomiting   Sulfamethoxazole-trimethoprim Nausea And Vomiting      Medication List    STOP taking these medications   ciprofloxacin 500 MG tablet Commonly known as: CIPRO     TAKE these medications   acetaminophen 500 MG tablet Commonly known as: TYLENOL Take 500-1,000 mg by mouth every 6 (six) hours as needed for fever or headache (pain).   aspirin 81 MG EC tablet Take 81 mg by mouth daily before breakfast.   atorvastatin 20 MG tablet Commonly known as: LIPITOR Take 1 tablet (20 mg total) by mouth daily. What changed: when to take this   cefdinir 300 MG capsule Commonly known as: OMNICEF Take 1 capsule (300 mg total) by mouth 2 (two) times daily.   Co Q 10 100 MG Caps Take 100 mg by mouth at bedtime.   Fish Oil 1000 MG Caps Take 1,000 mg by mouth 2 (two) times daily.   furosemide 40 MG tablet Commonly known as: LASIX Take 1/2 (one-half) tablet by mouth once daily What changed:   how much to take  how to take this  when to take this  reasons to take this  additional instructions   gabapentin 400 MG capsule Commonly known as: Neurontin Take 1 capsule (400 mg total) by mouth 3 (three) times daily. What changed:   when to take this  additional instructions   losartan 50 MG tablet Commonly known as: COZAAR TAKE 1 TABLET EVERY DAY What changed:   how much to take  when to take this   nitroGLYCERIN 0.4 MG SL tablet Commonly known as: NITROSTAT Place 0.4 mg under the tongue every 5 (five) minutes as needed for chest pain.   omeprazole 20 MG capsule Commonly known as: PRILOSEC Take 20 mg by mouth daily before breakfast.   PRESCRIPTION MEDICATION every 6 (six) months. 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   PreserVision AREDS 2 Caps Take 1 capsule by mouth 2 (two) times daily after a meal.   True Metrix Blood Glucose Test test strip Generic drug: glucose blood TEST BLOOD SUGAR THREE TIMES DAILY BEFORE MEALS   vitamin B-12 1000 MCG tablet Commonly known as: CYANOCOBALAMIN Take 1 tablet (1,000 mcg total) by mouth daily. What changed: when to take this       Follow-up Information    [facility], Kirsten, MD Follow up in 1 week(s).   Specialties: Family Medicine, Interventional Cardiology, Radiology, Anesthesiology Contact information: [address] [place] [address] [phone]              Allergies  Allergen Reactions  . Ace Inhibitors Other (See Comments)    Slow heart rate  . Hydrocodone Nausea And Vomiting  . Hydrocodone-Acetaminophen Nausea And Vomiting  . Sulfamethoxazole-Trimethoprim Nausea And Vomiting  Consultations:  None   Procedures/Studies: US RENAL  Result Date: 07/07/2020 CLINICAL DATA:  Sepsis EXAM: RENAL / URINARY TRACT ULTRASOUND COMPLETE COMPARISON:  04/01/2019 FINDINGS: Right Kidney: Renal measurements: 10.2 x 4.3 x 4.3 cm = volume: 94.3 mL. Echogenicity within normal limits. Mild renal cortical thinning. No mass or hydronephrosis. Left Kidney: Renal measurements: 12.9 x 5.9 x 5.0 cm = volume: 200 mL. Echogenicity within normal limits. Mild renal cortical thinning. No mass or hydronephrosis. Bladder: Bladder are minimally distended. Enlarged prostate causes mass effect on the posterior aspect of the bladder. Other: None. IMPRESSION: 1. Mild bilateral renal cortical thinning. Otherwise unremarkable appearance of the kidneys. 2. Enlarged prostate. Electronically Signed   By: Randa Ngo M.D.   On: 07/07/2020 16:41      Subjective: Patient was seen and examined at bedside.  Overnight events noted.  Patient reports feeling much better.  Patient has participated in physical therapy,   patient denies any urinary symptoms.  Patient wants to be discharged home.  Discharge Exam: Vitals:   07/09/20 0430 07/09/20 0734  BP: (!) 152/64 (!) 157/68  Pulse: (!) 58 63  Resp: 19 15  Temp: 98.2 F (36.8 C) 97.6 F (36.4 C)  SpO2: 97% 99%   Vitals:   07/08/20 2012 07/08/20 2316 07/09/20 0430 07/09/20 0734  BP: 139/64 131/61 (!) 152/64 (!) 157/68  Pulse: 65 (!) 54 (!) 58 63  Resp: (!) 21 16 19 15   Temp: 98.1 F (36.7 C) 98.7 F (37.1 C) 98.2 F (36.8 C) 97.6 F (36.4 C)  TempSrc: Oral Oral Oral Oral  SpO2: 98% 95% 97% 99%  Weight:      Height:        General: Pt is alert, awake, not in acute distress Cardiovascular: RRR, S1/S2 +, no rubs, no gallops Respiratory: CTA bilaterally, no wheezing, no rhonchi Abdominal: Soft, NT, ND, bowel sounds + Extremities: no edema, no cyanosis    The results of significant diagnostics from this hospitalization (including imaging, microbiology, ancillary and laboratory) are listed below for reference.     Microbiology: Recent Results (from the past 240 hour(s))  Urine Culture     Status: Abnormal   Collection Time: 07/04/20 11:50 AM   Specimen: Urine   UR  Result Value Ref Range Status   Urine Culture, Routine Final report (A)  Final   Organism ID, Bacteria Escherichia coli (A)  Final    Comment: Cefazolin <=4 ug/mL Cefazolin with an MIC <=16 predicts susceptibility to the oral agents cefaclor, cefdinir, cefpodoxime, cefprozil, cefuroxime, cephalexin, and loracarbef when used for therapy of uncomplicated urinary tract infections due to E. coli, Klebsiella pneumoniae, and Proteus mirabilis. Greater than 100,000 colony forming units per mL    ORGANISM ID, BACTERIA Comment  Final    Comment: Mixed urogenital flora 10,000-25,000 colony forming units per mL    Antimicrobial Susceptibility Comment  Final    Comment:       ** S = Susceptible; I = Intermediate; R = Resistant **                    P = Positive; N = Negative              MICS are expressed in micrograms per mL    Antibiotic                 RSLT#1    RSLT#2    RSLT#3    RSLT#4 Amoxicillin/Clavulanic Acid    S Ampicillin  S Cefepime                       S Ceftriaxone                    S Cefuroxime                     S Ciprofloxacin                  R Ertapenem                      S Gentamicin                     S Imipenem                       S Levofloxacin                   R Meropenem                      S Nitrofurantoin                 S Piperacillin/Tazobactam        S Tetracycline                   S Tobramycin                     S Trimethoprim/Sulfa             R   Urine C&S     Status: Abnormal   Collection Time: 07/07/20  1:30 PM   Specimen: Urine, Clean Catch  Result Value Ref Range Status   Specimen Description URINE, CLEAN CATCH  Final   Special Requests NONE  Final   Culture (A)  Final    <10,000 COLONIES/mL INSIGNIFICANT GROWTH Performed at [facility], [address], [place], [place] [address]    Report Status 07/08/2020 FINAL  Final  Blood culture (routine x 2)     Status: None (Preliminary result)   Collection Time: 07/07/20  2:29 PM   Specimen: BLOOD  Result Value Ref Range Status   Specimen Description BLOOD BLOOD LEFT FOREARM  Final   Special Requests   Final    BOTTLES DRAWN AEROBIC AND ANAEROBIC Blood Culture adequate volume   Culture   Final    NO GROWTH < 24 HOURS Performed at [facility], [place] [address], [place], [place] [address]    Report Status PENDING  Incomplete  Respiratory Panel by RT PCR (Flu A&B, Covid) - Nasopharyngeal Swab     Status: None   Collection Time: 07/07/20  8:06 PM   Specimen: Nasopharyngeal Swab  Result Value Ref Range Status   SARS Coronavirus 2 by RT PCR NEGATIVE NEGATIVE Final    Comment: (NOTE) SARS-CoV-2 target nucleic acids are NOT DETECTED.  The SARS-CoV-2 RNA is generally detectable in upper respiratoy specimens during the acute  phase of infection. The lowest concentration of SARS-CoV-2 viral copies this assay can detect is 131 copies/mL. A negative result does not preclude SARS-Cov-2 infection and should not be used as the sole basis for treatment or other patient management decisions. A negative result may occur with  improper specimen collection/handling, submission of specimen other than nasopharyngeal swab, presence of viral mutation(s) within the areas targeted  by this assay, and inadequate number of viral copies (<131 copies/mL). A negative result must be combined with clinical observations, patient history, and epidemiological information. The expected result is Negative.  Fact Sheet for Patients:  PinkCheek.be  Fact Sheet for Healthcare Providers:  GravelBags.it  This test is no t yet approved or cleared by the [place] FDA and  has been authorized for detection and/or diagnosis of SARS-CoV-2 by FDA under an Emergency Use Authorization (EUA). This EUA will remain  in effect (meaning this test can be used) for the duration of the COVID-19 declaration under Section 564(b)(1) of the Act, 21 U.S.C. section 360bbb-3(b)(1), unless the authorization is terminated or revoked sooner.     Influenza A by PCR NEGATIVE NEGATIVE Final   Influenza B by PCR NEGATIVE NEGATIVE Final    Comment: (NOTE) The Xpert Xpress SARS-CoV-2/FLU/RSV assay is intended as an aid in  the diagnosis of influenza from Nasopharyngeal swab specimens and  should not be used as a sole basis for treatment. Nasal washings and  aspirates are unacceptable for Xpert Xpress SARS-CoV-2/FLU/RSV  testing.  Fact Sheet for Patients: PinkCheek.be  Fact Sheet for Healthcare Providers: GravelBags.it  This test is not yet approved or cleared by the [place] FDA and  has been authorized for detection and/or diagnosis of  SARS-CoV-2 by  FDA under an Emergency Use Authorization (EUA). This EUA will remain  in effect (meaning this test can be used) for the duration of the  Covid-19 declaration under Section 564(b)(1) of the Act, 21  U.S.C. section 360bbb-3(b)(1), unless the authorization is  terminated or revoked. Performed at [facility], [place] [address] [place], [place] [address]      Labs: BNP (last 3 results) No results for input(s): BNP in the last 8760 hours. Basic Metabolic Panel: Recent Labs  Lab 07/07/20 1349 07/08/20 0417 07/09/20 0258  NA 136 137 137  K 4.0 3.9 3.7  CL 101 107 106  CO2 23 22 24   GLUCOSE 189* 141* 129*  BUN 24* 17 15  CREATININE 1.62* 1.25* 1.20  CALCIUM 8.6* 8.0* 8.1*  MG  --   --  1.8  PHOS  --   --  2.4*   Liver Function Tests: No results for input(s): AST, ALT, ALKPHOS, BILITOT, PROT, ALBUMIN in the last 168 hours. No results for input(s): LIPASE, AMYLASE in the last 168 hours. No results for input(s): AMMONIA in the last 168 hours. CBC: Recent Labs  Lab 07/07/20 1349 07/08/20 0417 07/09/20 0258  WBC 17.8* 16.7* 12.9*  HGB 13.5 11.7* 11.2*  HCT 41.3 35.2* 34.2*  MCV 88.2 87.8 87.5  PLT 259 185 205   Cardiac Enzymes: No results for input(s): CKTOTAL, CKMB, CKMBINDEX, TROPONINI in the last 168 hours. BNP: Invalid input(s): POCBNP CBG: No results for input(s): GLUCAP in the last 168 hours. D-Dimer No results for input(s): DDIMER in the last 72 hours. Hgb A1c Recent Labs    07/08/20 0417  HGBA1C 5.7*   Lipid Profile No results for input(s): CHOL, HDL, LDLCALC, TRIG, CHOLHDL, LDLDIRECT in the last 72 hours. Thyroid function studies No results for input(s): TSH, T4TOTAL, T3FREE, THYROIDAB in the last 72 hours.  Invalid input(s): FREET3 Anemia work up No results for input(s): VITAMINB12, FOLATE, FERRITIN, TIBC, IRON, RETICCTPCT in the last 72 hours. Urinalysis    Component Value Date/Time   COLORURINE YELLOW 07/07/2020 1405    APPEARANCEUR CLOUDY (A) 07/07/2020 1405   LABSPEC 1.020 07/07/2020 1405   PHURINE 5.0 07/07/2020  [place] 07/07/2020 1405   HGBUR MODERATE (A) 07/07/2020 [place] 07/07/2020 1405   BILIRUBINUR negative 07/04/2020 [place] 07/07/2020 1405   PROTEINUR 100 (A) 07/07/2020 1405   UROBILINOGEN 0.2 07/04/2020 1142   NITRITE NEGATIVE 07/07/2020 1405   LEUKOCYTESUR MODERATE (A) 07/07/2020 1405   Sepsis Labs Invalid input(s): PROCALCITONIN,  WBC,  LACTICIDVEN Microbiology Recent Results (from the past 240 hour(s))  Urine Culture     Status: Abnormal   Collection Time: 07/04/20 11:50 AM   Specimen: Urine   UR  Result Value Ref Range Status   Urine Culture, Routine Final report (A)  Final   Organism ID, Bacteria Escherichia coli (A)  Final    Comment: Cefazolin <=4 ug/mL Cefazolin with an MIC <=16 predicts susceptibility to the oral agents cefaclor, cefdinir, cefpodoxime, cefprozil, cefuroxime, cephalexin, and loracarbef when used for therapy of uncomplicated urinary tract infections due to E. coli, Klebsiella pneumoniae, and Proteus mirabilis. Greater than 100,000 colony forming units per mL    ORGANISM ID, BACTERIA Comment  Final    Comment: Mixed urogenital flora 10,000-25,000 colony forming units per mL    Antimicrobial Susceptibility Comment  Final    Comment:       ** S = Susceptible; I = Intermediate; R = Resistant **                    P = Positive; N = Negative             MICS are expressed in micrograms per mL    Antibiotic                 RSLT#1    RSLT#2    RSLT#3    RSLT#4 Amoxicillin/Clavulanic Acid    S Ampicillin                     S Cefepime                       S Ceftriaxone                    S Cefuroxime                     S Ciprofloxacin                  R Ertapenem                      S Gentamicin                     S Imipenem                       S Levofloxacin                   R Meropenem                       S Nitrofurantoin                 S Piperacillin/Tazobactam        S Tetracycline                   S Tobramycin  S Trimethoprim/Sulfa             R   Urine C&S     Status: Abnormal   Collection Time: 07/07/20  1:30 PM   Specimen: Urine, Clean Catch  Result Value Ref Range Status   Specimen Description URINE, CLEAN CATCH  Final   Special Requests NONE  Final   Culture (A)  Final    <10,000 COLONIES/mL INSIGNIFICANT GROWTH Performed at [facility], [address], [place], [place] [address]    Report Status 07/08/2020 FINAL  Final  Blood culture (routine x 2)     Status: None (Preliminary result)   Collection Time: 07/07/20  2:29 PM   Specimen: BLOOD  Result Value Ref Range Status   Specimen Description BLOOD BLOOD LEFT FOREARM  Final   Special Requests   Final    BOTTLES DRAWN AEROBIC AND ANAEROBIC Blood Culture adequate volume   Culture   Final    NO GROWTH < 24 HOURS Performed at [facility], [place] [address] [place],  [address]    Report Status PENDING  Incomplete  Respiratory Panel by RT PCR (Flu A&B, Covid) - Nasopharyngeal Swab     Status: None   Collection Time: 07/07/20  8:06 PM   Specimen: Nasopharyngeal Swab  Result Value Ref Range Status   SARS Coronavirus 2 by RT PCR NEGATIVE NEGATIVE Final    Comment: (NOTE) SARS-CoV-2 target nucleic acids are NOT DETECTED.  The SARS-CoV-2 RNA is generally detectable in upper respiratoy specimens during the acute phase of infection. The lowest concentration of SARS-CoV-2 viral copies this assay can detect is 131 copies/mL. A negative result does not preclude SARS-Cov-2 infection and should not be used as the sole basis for treatment or other patient management decisions. A negative result may occur with  improper specimen collection/handling, submission of specimen other than nasopharyngeal swab, presence of viral mutation(s) within the areas targeted by this assay, and  inadequate number of viral copies (<131 copies/mL). A negative result must be combined with clinical observations, patient history, and epidemiological information. The expected result is Negative.  Fact Sheet for Patients:  PinkCheek.be  Fact Sheet for Healthcare Providers:  GravelBags.it  This test is no t yet approved or cleared by the [place] FDA and  has been authorized for detection and/or diagnosis of SARS-CoV-2 by FDA under an Emergency Use Authorization (EUA). This EUA will remain  in effect (meaning this test can be used) for the duration of the COVID-19 declaration under Section 564(b)(1) of the Act, 21 U.S.C. section 360bbb-3(b)(1), unless the authorization is terminated or revoked sooner.     Influenza A by PCR NEGATIVE NEGATIVE Final   Influenza B by PCR NEGATIVE NEGATIVE Final    Comment: (NOTE) The Xpert Xpress SARS-CoV-2/FLU/RSV assay is intended as an aid in  the diagnosis of influenza from Nasopharyngeal swab specimens and  should not be used as a sole basis for treatment. Nasal washings and  aspirates are unacceptable for Xpert Xpress SARS-CoV-2/FLU/RSV  testing.  Fact Sheet for Patients: PinkCheek.be  Fact Sheet for Healthcare Providers: GravelBags.it  This test is not yet approved or cleared by the [place] FDA and  has been authorized for detection and/or diagnosis of SARS-CoV-2 by  FDA under an Emergency Use Authorization (EUA). This EUA will remain  in effect (meaning this test can be used) for the duration of the  Covid-19 declaration under Section 564(b)(1) of the Act, 21  U.S.C. section 360bbb-3(b)(1), unless  the authorization is  terminated or revoked. Performed at [facility], [place] [address], [place], [place] [address]      Time coordinating discharge: Over 30 minutes  SIGNED:   Shawna Clamp, MD  [facility] 07/09/2020, 11:04 AM Pager   If 7PM-7AM, please contact night-coverage [email or web address]

## 2020-07-09 NOTE — Progress Notes (Signed)
Discharge instructions given to patient. IV removed, clean and intact. Medications and follow up appointments reviewed, all questions answered. Pt escorted home by daughter.  Arletta Bale, RN

## 2020-07-09 NOTE — TOC Transition Note (Addendum)
Transition of Care (TOC) - CM/SW Discharge Note Marvetta Gibbons RN, BSN Transitions of Care Unit 4E- RN Case Manager See Treatment Team for direct phone #    Patient Details  Name: Chan Sheahan MRN: [id number] Date of Birth: Oct 04, 1935  Transition of [facility] CM/SW Contact:  Dawayne Patricia, RN Phone Number: 07/09/2020, 11:42 AM   Clinical Narrative:    Pt stable for transition home today, per PT recs order has been placed for HHPT- CM spoke with pt and daughter at the bedside to discuss recommendation and order for HHPT-Pt from home with wife and has family support for further assistance. Per pt he does not feel like he will need [place] services- he is anxious to get back to doing his normal activities and wants to get back to mobilizing at home. He politely declines a referral for [place] at this time however does state that if he gets home and decides that he should have done [facility] he will f/u with his PCP for [facility] needs- list provided Per CMS guidelines from medicare.gov website with star ratings (copy placed in shadow chart) for his choice should he change his mind.  Pt reports that he will have a RW available and will use it until he gets his strength back.  Daughter to transport home and is agreeable to hold off on [facility] for now.    Final next level of care: Home/Self Care Barriers to Discharge: No Barriers Identified   Patient Goals and CMS Choice Patient states their goals for this hospitalization and ongoing recovery are:: return home to normal activies CMS Medicare.gov Compare Post Acute Care list provided to:: Patient Choice offered to / list presented to : Patient  Discharge Placement           Home            Discharge Plan and Services   Discharge Planning Services: CM Consult Post Acute Care Choice: Home Health          DME Arranged: N/A DME Agency: NA       HH Arranged: PT, Patient Refused [place] Agency: NA        Social Determinants of Health ([place])  Interventions     Readmission Risk Interventions Readmission Risk Prevention Plan 07/09/2020  Post Dischage Appt Complete  Medication Screening Complete  Transportation Screening Complete  Some recent data might be hidden

## 2020-07-09 NOTE — Evaluation (Signed)
Physical Therapy Evaluation Patient Details Name: Kerry Matthews MRN: [id number] DOB: March 01, 1936 Today's Date: 07/09/2020   History of Present Illness  Pt is an 84 year old man admitted with sepsis, UTI and AKI with dehydration. PMH: recent dx of UTI, HTN, kidney stones, chronic back pain, DM, CAD, OA, emphysema.   Clinical Impression  PTA pt living with wife in single story home with 2 steps to enter. Pt reports independence in mobility, ADLs and iADLs. Pt is currently limited in safe mobility by L knee stiffness from prior fall, in presence of decreased strength and endurance. Pt is supervision for transfers and min guard for ambulation with RW. Tried ambulation without AD and requires minA for steadying. Pt and pt in agreement for RW usage until he regains his strength. PT recommends HHPT at discharge. PT will continue to follow acutely.     Follow Up Recommendations Home health PT;Supervision for mobility/OOB    Equipment Recommendations  None recommended by PT (pt will get RW from his church delivered today)    Recommendations for Other Services       Precautions / Restrictions Precautions Precautions: Fall Restrictions Weight Bearing Restrictions: No      Mobility  Bed Mobility               General bed mobility comments: pt received in chair    Transfers Overall transfer level: Needs assistance   Transfers: Sit to/from Stand Sit to Stand: Supervision         General transfer comment: supervision for safety and lines  Ambulation/Gait Ambulation/Gait assistance: Min guard;Min assist Gait Distance (Feet): 500 Feet Assistive device: Rolling walker (2 wheeled);None Gait Pattern/deviations: Step-through pattern;Decreased stance time - left;Decreased step length - right;Shuffle;Trunk flexed Gait velocity: slowed Gait velocity interpretation: <1.8 ft/sec, indicate of risk for recurrent falls General Gait Details: min guard for ambulation with RW, pt with  increased L knee buckling due to injury from prior fall, gait is stronger with increased distance, trialed ambulation without AD and requires min a for steadying, vc for proximity to RW and upright posture.       Balance Overall balance assessment: Mild deficits observed, not formally tested                                           Pertinent Vitals/Pain Pain Assessment: No/denies pain    Home Living Family/patient expects to be discharged to:: Private residence Living Arrangements: Spouse/significant other Available Help at Discharge: Family;Available 24 hours/day Type of Home: House Home Access: Stairs to enter Entrance Stairs-Rails: Left Entrance Stairs-Number of Steps: 2 Home Layout: One level Home Equipment: Cane - single point;Bedside commode;Walker - 2 wheels Additional Comments: hold ceramic soap dish while getting into shower    Prior Function Level of Independence: Independent         Comments: driving, 7253-6644 steps a day.     Hand Dominance   Dominant Hand: Right    Extremity/Trunk Assessment   Upper Extremity Assessment Upper Extremity Assessment: Defer to OT evaluation    Lower Extremity Assessment Lower Extremity Assessment: LLE deficits/detail LLE Deficits / Details: L knee lacks full ext/flex from prior fall, very stiff with standing and walking LLE Coordination: decreased fine motor       Communication   Communication: No difficulties  Cognition Arousal/Alertness: Awake/alert Behavior During Therapy: WFL for tasks assessed/performed Overall Cognitive Status: Within Functional Limits  for tasks assessed                                        General Comments General comments (skin integrity, edema, etc.): sitting BP 154/70, standing 147/70, after ambulation 144/69, max HR with ambulation 96 bpm        Assessment/Plan    PT Assessment Patient needs continued PT services  PT Problem List Decreased  activity tolerance;Decreased range of motion;Decreased balance;Decreased mobility;Decreased knowledge of use of DME;Cardiopulmonary status limiting activity       PT Treatment Interventions DME instruction;Gait training;Stair training;Functional mobility training;Therapeutic activities;Therapeutic exercise;Balance training;Cognitive remediation;Patient/family education    PT Goals (Current goals can be found in the Care Plan section)  Acute Rehab PT Goals Patient Stated Goal: go home PT Goal Formulation: With patient Time For Goal Achievement: 07/23/20 Potential to Achieve Goals: Good    Frequency Min 3X/week    AM-PAC PT "6 Clicks" Mobility  Outcome Measure Help needed turning from your back to your side while in a flat bed without using bedrails?: None Help needed moving from lying on your back to sitting on the side of a flat bed without using bedrails?: None Help needed moving to and from a bed to a chair (including a wheelchair)?: None Help needed standing up from a chair using your arms (e.g., wheelchair or bedside chair)?: None Help needed to walk in hospital room?: None Help needed climbing 3-5 steps with a railing? : A Little 6 Click Score: 23    End of Session Equipment Utilized During Treatment: Gait belt Activity Tolerance: Patient tolerated treatment well Patient left: in chair;with call bell/phone within reach;with chair alarm set;Other (comment) (Physician in room ) Nurse Communication: Mobility status PT Visit Diagnosis: Unsteadiness on feet (R26.81);Other abnormalities of gait and mobility (R26.89);Muscle weakness (generalized) (M62.81);Difficulty in walking, not elsewhere classified (R26.2)    Time: 7867-6720 PT Time Calculation (min) (ACUTE ONLY): 39 min   Charges:   PT Evaluation $PT Eval Moderate Complexity: 1 Mod PT Treatments $Gait Training: 8-22 mins        Murphy Duzan B. Migdalia Dk PT, DPT Acute Rehabilitation Services Pager [phone] Office  [phone]   [place] 07/09/2020, 11:12 AM

## 2020-07-09 NOTE — Discharge Instructions (Signed)
Advised to follow up PCP in one week. Advised to take omnicef twice daily for 5 days for UTI.

## 2020-07-09 NOTE — Evaluation (Signed)
Occupational Therapy Evaluation and Discharge Patient Details Name: Kerry Matthews MRN: [id number] DOB: June 16, 1936 Today's Date: 07/09/2020    History of Present Illness Pt is an 84 year old man admitted with sepsis, UTI and AKI with dehydration. PMH: recent dx of UTI, HTN, kidney stones, chronic back pain, DM, CAD, OA, emphysema.    Clinical Impression   Pt was independent prior to admission. Pt is currently functioning at a supervision level and has excellent family support. VSS throughout. No further OT needs.     Follow Up Recommendations  No OT follow up    Equipment Recommendations  None recommended by OT    Recommendations for Other Services       Precautions / Restrictions Precautions Precautions: Fall Restrictions Weight Bearing Restrictions: No      Mobility Bed Mobility               General bed mobility comments: pt received in chair    Transfers Overall transfer level: Needs assistance   Transfers: Sit to/from Stand Sit to Stand: Supervision         General transfer comment: supervision for safety and lines    Balance Overall balance assessment: Mild deficits observed, not formally tested                                         ADL either performed or assessed with clinical judgement   ADL                                         General ADL Comments: pt overall functioning at supervision level     Vision Baseline Vision/History: Wears glasses Wears Glasses: At all times Patient Visual Report: No change from baseline       Perception     Praxis      Pertinent Vitals/Pain Pain Assessment: No/denies pain     Hand Dominance Right   Extremity/Trunk Assessment Upper Extremity Assessment Upper Extremity Assessment: Overall WFL for tasks assessed   Lower Extremity Assessment Lower Extremity Assessment: Defer to PT evaluation       Communication Communication Communication: No  difficulties   Cognition Arousal/Alertness: Awake/alert Behavior During Therapy: WFL for tasks assessed/performed Overall Cognitive Status: Within Functional Limits for tasks assessed                                     General Comments       Exercises     Shoulder Instructions      Home Living Family/patient expects to be discharged to:: Private residence Living Arrangements: Spouse/significant other Available Help at Discharge: Family;Available 24 hours/day Type of Home: House Home Access: Stairs to enter [employer] of Steps: 2 Entrance Stairs-Rails: Left Home Layout: One level     Bathroom Shower/Tub: Teacher, early years/pre: Handicapped height Bathroom Accessibility: Yes   Home Equipment: Cane - single point;Bedside commode;Walker - 2 wheels   Additional Comments: hold ceramic soap dish while getting into shower      Prior Functioning/Environment Level of Independence: Independent        Comments: driving, 1829-9371 steps a day.        OT Problem List:  OT Treatment/Interventions:      OT Goals(Current goals can be found in the care plan section) Acute Rehab OT Goals Patient Stated Goal: go home  OT Frequency:     Barriers to D/C:            Co-evaluation              AM-PAC OT "6 Clicks" Daily Activity     Outcome Measure Help from another person eating meals?: None Help from another person taking care of personal grooming?: A Little Help from another person toileting, which includes using toliet, bedpan, or urinal?: A Little Help from another person bathing (including washing, rinsing, drying)?: A Little Help from another person to put on and taking off regular upper body clothing?: None Help from another person to put on and taking off regular lower body clothing?: A Little 6 Click Score: 20   End of Session    Activity Tolerance: Patient tolerated treatment well Patient left: in  chair;with call bell/phone within reach;with nursing/sitter in room;with family/visitor present;with chair alarm set  OT Visit Diagnosis: Muscle weakness (generalized) (M62.81)                Time: 5456-2563 OT Time Calculation (min): 21 min Charges:  OT General Charges $OT Visit: 1 Visit  Nestor Lewandowsky, OTR/L Acute Rehabilitation Services Pager: [phone] Office: [phone]  Malka So 07/09/2020, 10:47 AM

## 2020-07-10 ENCOUNTER — Ambulatory Visit: Payer: Medicare HMO | Admitting: Family Medicine

## 2020-07-10 ENCOUNTER — Ambulatory Visit: Payer: Medicare HMO

## 2020-07-11 ENCOUNTER — Other Ambulatory Visit: Payer: Self-pay | Admitting: Family Medicine

## 2020-07-11 DIAGNOSIS — E782 Mixed hyperlipidemia: Secondary | ICD-10-CM

## 2020-07-12 LAB — CULTURE, BLOOD (ROUTINE X 2)
Culture: NO GROWTH
Culture: NO GROWTH
Special Requests: ADEQUATE
Special Requests: ADEQUATE

## 2020-07-13 ENCOUNTER — Ambulatory Visit: Payer: Medicare HMO | Admitting: Family Medicine

## 2020-07-13 DIAGNOSIS — M79661 Pain in right lower leg: Secondary | ICD-10-CM | POA: Diagnosis not present

## 2020-07-13 DIAGNOSIS — M25561 Pain in right knee: Secondary | ICD-10-CM | POA: Diagnosis not present

## 2020-07-13 DIAGNOSIS — M7989 Other specified soft tissue disorders: Secondary | ICD-10-CM | POA: Diagnosis not present

## 2020-07-13 DIAGNOSIS — R2241 Localized swelling, mass and lump, right lower limb: Secondary | ICD-10-CM | POA: Diagnosis not present

## 2020-07-13 DIAGNOSIS — M79604 Pain in right leg: Secondary | ICD-10-CM | POA: Diagnosis not present

## 2020-07-17 ENCOUNTER — Ambulatory Visit (INDEPENDENT_AMBULATORY_CARE_PROVIDER_SITE_OTHER): Payer: Medicare HMO | Admitting: Family Medicine

## 2020-07-17 ENCOUNTER — Other Ambulatory Visit: Payer: Self-pay

## 2020-07-17 ENCOUNTER — Encounter: Payer: Self-pay | Admitting: Family Medicine

## 2020-07-17 VITALS — BP 134/80 | HR 54 | Temp 97.2°F | Ht 67.0 in | Wt 189.0 lb

## 2020-07-17 DIAGNOSIS — A4151 Sepsis due to Escherichia coli [E. coli]: Secondary | ICD-10-CM

## 2020-07-17 DIAGNOSIS — N3000 Acute cystitis without hematuria: Secondary | ICD-10-CM

## 2020-07-17 DIAGNOSIS — I10 Essential (primary) hypertension: Secondary | ICD-10-CM | POA: Diagnosis not present

## 2020-07-17 DIAGNOSIS — L821 Other seborrheic keratosis: Secondary | ICD-10-CM | POA: Diagnosis not present

## 2020-07-17 DIAGNOSIS — R600 Localized edema: Secondary | ICD-10-CM | POA: Diagnosis not present

## 2020-07-17 DIAGNOSIS — I9589 Other hypotension: Secondary | ICD-10-CM

## 2020-07-17 LAB — POCT URINALYSIS DIPSTICK
Bilirubin, UA: NEGATIVE
Blood, UA: NEGATIVE
Glucose, UA: NEGATIVE
Ketones, UA: NEGATIVE
Leukocytes, UA: NEGATIVE
Nitrite, UA: NEGATIVE
Protein, UA: NEGATIVE
Spec Grav, UA: 1.01 (ref 1.010–1.025)
Urobilinogen, UA: 0.2 E.U./dL
pH, UA: 6 (ref 5.0–8.0)

## 2020-07-17 NOTE — Progress Notes (Signed)
Subjective:  Patient ID: Kerry Matthews, male    DOB: [birth year]  Age: 84 y.o. MRN: [id number]  Chief Complaint  Patient presents with  . Hospitalization Follow-up  . Urinary Tract Infection   HPI  Patient is an 84 yo male who presents for follow up from hospitalization. Admitted on 07/07/2020 and discharged on 07/09/2020. Patient was admitted for urosepsis, hypotension, mild acute kidney disease, and acute lower UTI. Pt has been having symptoms for 5 days. Patient had fever, chills, dysuria. Treated with hydration and IV rocephin. Culture e.coli sensitive to rocephin.  Pt has been receiving home health care PT. Last Friday the patient was seen at [facility] in [facility] for swelling of rt leg and rt knee. [place] rt leg was found negative for DVT. Changed cefdinir to keflex this past Friday. Was also given meloxicam 7.5 mg once daily.  Work up in the hospital: Renal US normal. Enlarged prostate.   Current Outpatient Medications on File Prior to Visit  Medication Sig Dispense Refill  . acetaminophen (TYLENOL) 500 MG tablet Take 500-1,000 mg by mouth every 6 (six) hours as needed for fever or headache (pain).    Marland Kitchen aspirin 81 MG EC tablet Take 81 mg by mouth daily before breakfast.     . atorvastatin (LIPITOR) 20 MG tablet Take 1 tablet (20 mg total) by mouth daily. (Patient taking differently: Take 20 mg by mouth at bedtime. ) 90 tablet 1  . cephALEXin (KEFLEX) 500 MG capsule     . Coenzyme Q10 (CO Q 10) 100 MG CAPS Take 100 mg by mouth at bedtime.     . furosemide (LASIX) 40 MG tablet Take 1/2 (one-half) tablet by mouth once daily (Patient taking differently: Take 20 mg by mouth daily as needed (ankle swelling). ) 45 tablet 0  . gabapentin (NEURONTIN) 400 MG capsule TAKE 1 CAPSULE THREE TIMES DAILY 270 capsule 0  . glucose blood (TRUE METRIX BLOOD GLUCOSE TEST) test strip TEST BLOOD SUGAR THREE TIMES DAILY BEFORE MEALS 300 strip 3  . losartan (COZAAR) 50 MG tablet TAKE 1 TABLET EVERY DAY (Patient taking  differently: Take 25 mg by mouth daily after supper. ) 90 tablet 1  . meloxicam (MOBIC) 7.5 MG tablet     . Multiple Vitamins-Minerals (PRESERVISION AREDS 2) CAPS Take 1 capsule by mouth 2 (two) times daily after a meal.    . nitroGLYCERIN (NITROSTAT) 0.4 MG SL tablet Place 0.4 mg under the tongue every 5 (five) minutes as needed for chest pain.     . Omega-3 Fatty Acids (FISH OIL) 1000 MG CAPS Take 1,000 mg by mouth 2 (two) times daily.     Marland Kitchen omeprazole (PRILOSEC) 20 MG capsule Take 20 mg by mouth daily before breakfast.     . PRESCRIPTION MEDICATION every 6 (six) months. 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    . vitamin B-12 (CYANOCOBALAMIN) 1000 MCG tablet Take 1 tablet (1,000 mcg total) by mouth daily. (Patient taking differently: Take 1,000 mcg by mouth daily before breakfast. ) 30 tablet 0   No current facility-administered medications on file prior to visit.   Past Medical History:  Diagnosis Date  . Bradycardia   . Coronary artery disease   . Diabetes ([place])   . GAD (generalized anxiety disorder)   . GERD (gastroesophageal reflux disease)   . Hyperlipidemia   . Hypertension   . Osteoarthritis   . Other emphysema ([place])   . Primary insomnia   . Renal stones   .  Skin cancer   . UC (ulcerative colitis) ([place])   . UTI (urinary tract infection)    Past Surgical History:  Procedure Laterality Date  . ANGIOPLASTY    . BACK SURGERY    . CATARACT EXTRACTION    . COLONOSCOPY  06/16/2005   Mild colitis involving splenic flexure. Colonic polyps, status post polypectomy. Mild pancolonic diverticulitits. Internal hemorrhoids.   . ESOPHAGOGASTRODUODENOSCOPY  04/26/2003   Irregular Z line suggestive of GERD. Mild gastritis status post CLO testing.   Marland Kitchen TRIGGER FINGER RELEASE      Family History  Problem Relation Age of Onset  . Tuberculosis Mother   . Stroke Father   . Pancreatic cancer Sister   . Heart attack Sister   . Lung disease Sister   .  Clotting disorder Brother   . Colon cancer Neg Hx   . Esophageal cancer Neg Hx    Social History   Socioeconomic History  . Marital status: Married    Spouse name: Not on file  . Number of children: 2  . Years of education: Not on file  . Highest education level: Not on file  Occupational History  . Occupation: retired  Tobacco Use  . Smoking status: Never Smoker  . Smokeless tobacco: Never Used  Vaping Use  . Vaping Use: Never used  Substance and Sexual Activity  . Alcohol use: Never  . Drug use: Never  . Sexual activity: Not on file  Other Topics Concern  . Not on file  Social History Narrative  . Not on file   Social Determinants of Health   Financial Resource Strain:   . Difficulty of Paying Living Expenses: Not on file  Food Insecurity: No Food Insecurity  . Worried About Charity fundraiser in the Last Year: Never true  . Ran Out of Food in the Last Year: Never true  Transportation Needs: No Transportation Needs  . Lack of Transportation (Medical): No  . Lack of Transportation (Non-Medical): No  Physical Activity: Sufficiently Active  . Days of Exercise per Week: 5 days  . Minutes of Exercise per Session: 30 min  Stress:   . Feeling of Stress : Not on file  Social Connections:   . Frequency of Communication with Friends and Family: Not on file  . Frequency of Social Gatherings with Friends and Family: Not on file  . Attends Religious Services: Not on file  . Active Member of Clubs or Organizations: Not on file  . Attends Archivist Meetings: Not on file  . Marital Status: Not on file    Review of Systems  Constitutional: Negative for chills, diaphoresis, fatigue and fever.  HENT: Negative for congestion, ear pain and sore throat.   Respiratory: Negative for cough and shortness of breath.   Cardiovascular: Positive for leg swelling (rt leg/foot. DVT ruled out past Friday by [place].). Negative for chest pain.  Gastrointestinal: Negative  for abdominal pain, constipation, diarrhea, nausea and vomiting.  Endocrine: Negative for polyuria.  Genitourinary: Negative for difficulty urinating, dysuria, hematuria and urgency.  Musculoskeletal: Positive for arthralgias (rt knee) and myalgias (sore over right leg .).  Skin:       Nose SK left lateral.   Neurological: Negative for dizziness and headaches.  Psychiatric/Behavioral: Negative for dysphoric mood.     Objective:  BP 134/80   Pulse (!) 54   Temp (!) 97.2 F (36.2 C)   Ht 5\' 7"  (1.702 m)   Wt 189 lb (85.7 kg)  SpO2 99%   BMI 29.60 kg/m   BP/Weight 07/17/2020 07/09/2020 14/43/1540  Systolic BP 086 761 -  Diastolic BP 80 72 -  Wt. (Lbs) 189 - 185  BMI 29.6 - 28.13    Physical Exam Vitals reviewed.  Constitutional:      Appearance: Normal appearance.  Cardiovascular:     Rate and Rhythm: Normal rate.     Heart sounds: Normal heart sounds.  Pulmonary:     Effort: Pulmonary effort is normal.     Breath sounds: Normal breath sounds.  Abdominal:     General: Bowel sounds are normal.     Palpations: Abdomen is soft.     Tenderness: There is no abdominal tenderness.  Musculoskeletal:        General: Tenderness present.     Right lower leg: Edema (1 +) present.     Left lower leg: No edema.  Skin:    Findings: Lesion (left side of nose SK.) present.  Neurological:     Mental Status: He is alert and oriented to person, place, and time.  Psychiatric:        Mood and Affect: Mood normal.        Behavior: Behavior normal.    Lab Results  Component Value Date   WBC 12.9 (H) 07/09/2020   HGB 11.2 (L) 07/09/2020   HCT 34.2 (L) 07/09/2020   PLT 205 07/09/2020   GLUCOSE 129 (H) 07/09/2020   CHOL 168 04/04/2020   TRIG 76 04/04/2020   HDL 43 04/04/2020   LDLCALC 111 (H) 04/04/2020   ALT 15 04/04/2020   AST 19 04/04/2020   NA 137 07/09/2020   K 3.7 07/09/2020   CL 106 07/09/2020   CREATININE 1.20 07/09/2020   BUN 15 07/09/2020   CO2 24 07/09/2020    TSH 2.690 12/01/2019   INR 1.3 (H) 04/06/2019   HGBA1C 5.7 (H) 07/08/2020   MICROALBUR 10 03/06/2020      Assessment & Plan:  1. Escherichia coli sepsis (HCC) Resolved.  - POCT urinalysis dipstick normal.  2. Primary hypertension Well controlled.  No changes to medicines.  Continue to work on eating a healthy diet and exercise.  Labs drawn today.  - Comprehensive metabolic panel - CBC with Differential/Platelet  3. Keratosis, seborrheic - LEFT SIDE OF NOSE.  Cryo therapy on Nose. 5 secs x 2 cycles.   4. Pedal Edema: start lasix 40 mg 1/2 daily. Avoid salt. Elevate leg.  5. Other specified hypotension Resolved.    I spent 30 minutes dedicated to the care of this patient on the date of this encounter to include face-to-face time with the patient, as well as: review of hospital records/labs.   Follow-up: Return for AWV IN dECEMBER 2021 WITH DR. Hoyt Koch. Marland Kitchen  An After Visit Summary was printed and given to the patient.  [name], MD [facility] [phone]

## 2020-07-17 NOTE — Patient Instructions (Addendum)
Lasix 40 mg 1/2 pill daily for one week, then hold it.   Avoid SALT. Elevated legs Complete keflex.  Hold meloxicam.  Check labs.    Cryosurgery for Skin Conditions, Care After These instructions give you information on caring for yourself after your procedure. Your doctor may also give you more specific instructions. Call your doctor if you have any problems or questions after your procedure. Follow these instructions at home: Caring for the treated area   Follow instructions from your doctor about how to take care of the treated area. Make sure you: ? Keep the area covered with a bandage (dressing) until it heals, or for as long as told by your doctor. ? Wash your hands with soap and water before you change your bandage. If you do not have soap and water, use hand sanitizer. ? Change your bandage as told by your doctor. ? Keep the bandage and the treated area clean and dry. If the bandage gets wet, change it right away. ? Clean the treated area with soap and water.  Check the treated area every day for signs of infection. Check for: ? More redness, swelling, or pain. ? More fluid or blood. ? Warmth. ? Pus or a bad smell. General instructions  Do not pick at your blister. Do not try to break it open. This can cause infection and scarring.  Do not put any medicine, cream, or lotion on the treated area unless told by your doctor.  Take over-the-counter and prescription medicines only as told by your doctor.  Keep all follow-up visits as told by your doctor. This is important. Contact a doctor if:  You have more redness, swelling, or pain around the treated area.  You have more fluid or blood coming from the treated area.  The treated area feels warm to the touch.  You have pus or a bad smell coming from the treated area.  Your blister gets large and painful. Get help right away if:  You have a fever and have redness spreading from the treated area. Summary  You  should keep the treated area and your bandage clean and dry.  Check the treated area every day for signs of infection. Signs include fluid, pus, warmth, or having more redness, swelling, or pain.  Do not pick at your blister. Do not try to break it open. This information is not intended to replace advice given to you by your health care provider. Make sure you discuss any questions you have with your health care provider. Document Revised: 07/24/2017 Document Reviewed: 06/30/2016 Elsevier Patient Education  2020 [employer].

## 2020-07-18 ENCOUNTER — Other Ambulatory Visit: Payer: Self-pay

## 2020-07-18 DIAGNOSIS — R799 Abnormal finding of blood chemistry, unspecified: Secondary | ICD-10-CM

## 2020-07-18 LAB — CBC WITH DIFFERENTIAL/PLATELET
Basophils Absolute: 0.1 10*3/uL (ref 0.0–0.2)
Basos: 1 %
EOS (ABSOLUTE): 0.4 10*3/uL (ref 0.0–0.4)
Eos: 5 %
Hematocrit: 36.8 % — ABNORMAL LOW (ref 37.5–51.0)
Hemoglobin: 12.4 g/dL — ABNORMAL LOW (ref 13.0–17.7)
Immature Grans (Abs): 0.1 10*3/uL (ref 0.0–0.1)
Immature Granulocytes: 1 %
Lymphocytes Absolute: 2.2 10*3/uL (ref 0.7–3.1)
Lymphs: 31 %
MCH: 29.5 pg (ref 26.6–33.0)
MCHC: 33.7 g/dL (ref 31.5–35.7)
MCV: 87 fL (ref 79–97)
Monocytes Absolute: 0.7 10*3/uL (ref 0.1–0.9)
Monocytes: 10 %
Neutrophils Absolute: 3.7 10*3/uL (ref 1.4–7.0)
Neutrophils: 52 %
Platelets: 498 10*3/uL — ABNORMAL HIGH (ref 150–450)
RBC: 4.21 x10E6/uL (ref 4.14–5.80)
RDW: 12.8 % (ref 11.6–15.4)
WBC: 7.1 10*3/uL (ref 3.4–10.8)

## 2020-07-18 LAB — COMPREHENSIVE METABOLIC PANEL
ALT: 50 IU/L — ABNORMAL HIGH (ref 0–44)
AST: 29 IU/L (ref 0–40)
Albumin/Globulin Ratio: 1.6 (ref 1.2–2.2)
Albumin: 3.6 g/dL (ref 3.6–4.6)
Alkaline Phosphatase: 166 IU/L — ABNORMAL HIGH (ref 44–121)
BUN/Creatinine Ratio: 10 (ref 10–24)
BUN: 16 mg/dL (ref 8–27)
Bilirubin Total: 0.3 mg/dL (ref 0.0–1.2)
CO2: 26 mmol/L (ref 20–29)
Calcium: 8.5 mg/dL — ABNORMAL LOW (ref 8.6–10.2)
Chloride: 102 mmol/L (ref 96–106)
Creatinine, Ser: 1.68 mg/dL — ABNORMAL HIGH (ref 0.76–1.27)
GFR calc Af Amer: 43 mL/min/{1.73_m2} — ABNORMAL LOW (ref >59)
GFR calc non Af Amer: 37 mL/min/{1.73_m2} — ABNORMAL LOW (ref >59)
Globulin, Total: 2.3 g/dL (ref 1.5–4.5)
Glucose: 114 mg/dL — ABNORMAL HIGH (ref 65–99)
Potassium: 5.1 mmol/L (ref 3.5–5.2)
Sodium: 139 mmol/L (ref 134–144)
Total Protein: 5.9 g/dL — ABNORMAL LOW (ref 6.0–8.5)

## 2020-07-24 ENCOUNTER — Other Ambulatory Visit: Payer: Self-pay

## 2020-07-24 DIAGNOSIS — E782 Mixed hyperlipidemia: Secondary | ICD-10-CM

## 2020-07-24 MED ORDER — ATORVASTATIN CALCIUM 20 MG PO TABS: 20.0000 mg | ORAL_TABLET | Freq: Every day | ORAL | 0 refills | Status: DC

## 2020-07-26 ENCOUNTER — Other Ambulatory Visit: Payer: Self-pay

## 2020-07-26 ENCOUNTER — Other Ambulatory Visit: Payer: Medicare HMO

## 2020-07-26 DIAGNOSIS — R799 Abnormal finding of blood chemistry, unspecified: Secondary | ICD-10-CM | POA: Diagnosis not present

## 2020-07-27 LAB — COMPREHENSIVE METABOLIC PANEL
ALT: 23 IU/L (ref 0–44)
AST: 26 IU/L (ref 0–40)
Albumin/Globulin Ratio: 2 (ref 1.2–2.2)
Albumin: 4.1 g/dL (ref 3.6–4.6)
Alkaline Phosphatase: 137 IU/L — ABNORMAL HIGH (ref 44–121)
BUN/Creatinine Ratio: 15 (ref 10–24)
BUN: 20 mg/dL (ref 8–27)
Bilirubin Total: 0.6 mg/dL (ref 0.0–1.2)
CO2: 21 mmol/L (ref 20–29)
Calcium: 8.8 mg/dL (ref 8.6–10.2)
Chloride: 105 mmol/L (ref 96–106)
Creatinine, Ser: 1.37 mg/dL — ABNORMAL HIGH (ref 0.76–1.27)
GFR calc Af Amer: 55 mL/min/{1.73_m2} — ABNORMAL LOW (ref >59)
GFR calc non Af Amer: 48 mL/min/{1.73_m2} — ABNORMAL LOW (ref >59)
Globulin, Total: 2.1 g/dL (ref 1.5–4.5)
Glucose: 114 mg/dL — ABNORMAL HIGH (ref 65–99)
Potassium: 4.8 mmol/L (ref 3.5–5.2)
Sodium: 140 mmol/L (ref 134–144)
Total Protein: 6.2 g/dL (ref 6.0–8.5)

## 2020-07-31 DIAGNOSIS — R5383 Other fatigue: Secondary | ICD-10-CM | POA: Diagnosis not present

## 2020-07-31 DIAGNOSIS — Z79899 Other long term (current) drug therapy: Secondary | ICD-10-CM | POA: Diagnosis not present

## 2020-07-31 DIAGNOSIS — M317 Microscopic polyangiitis: Secondary | ICD-10-CM | POA: Diagnosis not present

## 2020-07-31 DIAGNOSIS — Z111 Encounter for screening for respiratory tuberculosis: Secondary | ICD-10-CM | POA: Diagnosis not present

## 2020-08-09 ENCOUNTER — Ambulatory Visit (INDEPENDENT_AMBULATORY_CARE_PROVIDER_SITE_OTHER): Payer: Medicare HMO | Admitting: Family Medicine

## 2020-08-09 ENCOUNTER — Other Ambulatory Visit: Payer: Self-pay

## 2020-08-09 ENCOUNTER — Encounter: Payer: Self-pay | Admitting: Family Medicine

## 2020-08-09 VITALS — BP 104/52 | HR 66 | Resp 18 | Ht 67.0 in | Wt 188.2 lb

## 2020-08-09 DIAGNOSIS — I10 Essential (primary) hypertension: Secondary | ICD-10-CM

## 2020-08-09 DIAGNOSIS — M25562 Pain in left knee: Secondary | ICD-10-CM

## 2020-08-09 DIAGNOSIS — I2510 Atherosclerotic heart disease of native coronary artery without angina pectoris: Secondary | ICD-10-CM

## 2020-08-09 DIAGNOSIS — Z0000 Encounter for general adult medical examination without abnormal findings: Secondary | ICD-10-CM

## 2020-08-09 HISTORY — DX: Encounter for general adult medical examination without abnormal findings: Z00.00

## 2020-08-09 NOTE — Progress Notes (Signed)
Subjective:   Kerry Matthews is a 84 y.o. male who presents for Medicare Annual/Subsequent preventive examination.  Review of Systems    Right LE edema-taking lasix 20mg  RA-Ratuxin-last week infusion-q 3months- Cardiac Risk Factors include: advanced age (>77men, >76 women);diabetes mellitus     Objective:    Today's Vitals   08/09/20 1351  BP: (!) 104/52  Pulse: 66  Resp: 18  SpO2: 97%  Weight: 188 lb 3.2 oz (85.4 kg)  Height: 5\' 7"  (1.702 m)   Body mass index is 29.48 kg/m.  Advanced Directives 08/09/2020 04/20/2019 04/01/2019  Does Patient Have a Medical Advance Directive? No No No  Would patient like information on creating a medical advance directive? - No - Patient declined No - Patient declined    Current Medications (verified) Outpatient Encounter Medications as of 08/09/2020  Medication Sig  . acetaminophen (TYLENOL) 500 MG tablet Take 500-1,000 mg by mouth every 6 (six) hours as needed for fever or headache (pain).  Marland Kitchen aspirin 81 MG EC tablet Take 81 mg by mouth daily before breakfast.   . atorvastatin (LIPITOR) 20 MG tablet Take 1 tablet (20 mg total) by mouth at bedtime.  . Coenzyme Q10 (CO Q 10) 100 MG CAPS Take 100 mg by mouth at bedtime.   . furosemide (LASIX) 40 MG tablet Take 1/2 (one-half) tablet by mouth once daily (Patient taking differently: Take 20 mg by mouth daily as needed (ankle swelling).)  . gabapentin (NEURONTIN) 400 MG capsule TAKE 1 CAPSULE THREE TIMES DAILY  . glucose blood (TRUE METRIX BLOOD GLUCOSE TEST) test strip TEST BLOOD SUGAR THREE TIMES DAILY BEFORE MEALS  . losartan (COZAAR) 50 MG tablet TAKE 1 TABLET EVERY DAY (Patient taking differently: Take 25 mg by mouth daily after supper.)  . Multiple Vitamins-Minerals (PRESERVISION AREDS 2) CAPS Take 1 capsule by mouth 2 (two) times daily after a meal.  . nitroGLYCERIN (NITROSTAT) 0.4 MG SL tablet Place 0.4 mg under the tongue every 5 (five) minutes as needed for chest pain.   . Omega-3 Fatty  Acids (FISH OIL) 1000 MG CAPS Take 1,000 mg by mouth 2 (two) times daily.   Marland Kitchen omeprazole (PRILOSEC) 20 MG capsule Take 20 mg by mouth daily before breakfast.   . PRESCRIPTION MEDICATION every 6 (six) months. Infusion related to auto-immune issues - Dr Lenna Gilford, [place].  . vitamin B-12 (CYANOCOBALAMIN) 1000 MCG tablet Take 1 tablet (1,000 mcg total) by mouth daily. (Patient taking differently: Take 1,000 mcg by mouth daily before breakfast.)  . [DISCONTINUED] cephALEXin (KEFLEX) 500 MG capsule    No facility-administered encounter medications on file as of 08/09/2020.    Allergies (verified) Ace inhibitors, Hydrocodone, Hydrocodone-acetaminophen, and Sulfamethoxazole-trimethoprim   History: Past Medical History:  Diagnosis Date  . Bradycardia   . Coronary artery disease   . Diabetes ([place])   . GAD (generalized anxiety disorder)   . GERD (gastroesophageal reflux disease)   . Hyperlipidemia   . Hypertension   . Osteoarthritis   . Other emphysema ([place])   . Primary insomnia   . Renal stones   . Skin cancer   . UC (ulcerative colitis) ([place])   . UTI (urinary tract infection)    Past Surgical History:  Procedure Laterality Date  . ANGIOPLASTY    . BACK SURGERY    . CATARACT EXTRACTION    . COLONOSCOPY  06/16/2005   Mild colitis involving splenic flexure. Colonic polyps, status post polypectomy. Mild pancolonic diverticulitits. Internal hemorrhoids.   . ESOPHAGOGASTRODUODENOSCOPY  04/26/2003  Irregular Z line suggestive of GERD. Mild gastritis status post CLO testing.   Marland Kitchen TRIGGER FINGER RELEASE     Family History  Problem Relation Age of Onset  . Tuberculosis Mother   . Stroke Father   . Pancreatic cancer Sister   . Heart attack Sister   . Lung disease Sister   . Clotting disorder Brother   . Colon cancer Neg Hx   . Esophageal cancer Neg Hx    Social History   Socioeconomic History  . Marital status: Married    Spouse name: Not on file  . Number of children: 2   . Years of education: Not on file  . Highest education level: Not on file  Occupational History  . Occupation: retired  Tobacco Use  . Smoking status: Never Smoker  . Smokeless tobacco: Never Used  Vaping Use  . Vaping Use: Never used  Substance and Sexual Activity  . Alcohol use: Never  . Drug use: Never  . Sexual activity: Not on file  Other Topics Concern  . Not on file  Social History Narrative  . Not on file   Social Determinants of Health   Financial Resource Strain: Not on file  Food Insecurity: No Food Insecurity  . Worried About Charity fundraiser in the Last Year: Never true  . Ran Out of Food in the Last Year: Never true  Transportation Needs: No Transportation Needs  . Lack of Transportation (Medical): No  . Lack of Transportation (Non-Medical): No  Physical Activity: Sufficiently Active  . Days of Exercise per Week: 5 days  . Minutes of Exercise per Session: 30 min  Stress: Not on file  Social Connections: Not on file    Clinical Intake:  Pre-visit preparation completed: Yes  Pain : No/denies pain    Nutritional Risks: None  How often do you need to have someone help you when you read instructions, pamphlets, or other written materials from your doctor or pharmacy?: 1 - Never What is the last grade level you completed in school?: 8  Diabetic?yes  Interpreter Needed?: No    Activities of Daily Living In your present state of health, do you have any difficulty performing the following activities: 08/09/2020  Hearing? Y  Vision? Y  Difficulty concentrating or making decisions? Y  Walking or climbing stairs? Y  Dressing or bathing? N  Doing errands, shopping? N  Preparing Food and eating ? N  Using the Toilet? Y  In the past six months, have you accidently leaked urine? N  Do you have problems with loss of bowel control? N  Managing your Medications? N  Managing your Finances? N  Housekeeping or managing your Housekeeping? N  Some recent  data might be hidden    Patient Care Team: [name], MD as PCP - General (Family Medicine) Burnice Logan, [facility] as Pharmacist (Pharmacist)  Dr. Hawks-Rheumatologist-[facility] Dr. Renaldo Fiddler [facility] Dr. Modesto Charon Dr. Cram-Neurosurgery Dr. Ranvenkar-Cardiology Assessment:   This is a routine wellness examination for Kimball.  Hearing/Vision screen  Hearing Screening   125Hz  250Hz  500Hz  1000Hz  2000Hz  3000Hz  4000Hz  6000Hz  8000Hz   Right ear:           Left ear:             Visual Acuity Screening   Right eye Left eye Both eyes  Without correction:     With correction: 20/50 20/40 20/40     Dietary issues and exercise activities discussed: Current Exercise Habits: Home exercise  routine, Type of exercise: walking, Time (Minutes): 60, Frequency (Times/Week): 7, Weekly Exercise (Minutes/Week): 420, Intensity: Mild  Goals    . Pharmacy Care Plan     CARE PLAN ENTRY (see longitudinal plan of care for additional care plan information)  Current Barriers:  . Chronic Disease Management support, education, and care coordination needs related to Hypertension, Hyperlipidemia, and Diabetes   Hypertension BP Readings from Last 3 Encounters:  04/06/20 120/60  03/06/20 118/60  12/12/19 122/64   . Pharmacist Clinical Goal(s): o Over the next 90 days, patient will work with PharmD and providers to maintain BP goal <130/80 . Current regimen:   Losartan 25 mg daily   Furosemide 20 mg daily  . Interventions: o Reviewed home diet and exercise.  o Reviewed medications.  . Patient self care activities - Over the next 90 days, patient will: o Check BP weekly, document, and provide at future appointments o Ensure daily salt intake < 2300 mg/day  Hyperlipidemia Lab Results  Component Value Date/Time   LDLCALC 111 (H) 04/04/2020 07:43 AM   . Pharmacist Clinical Goal(s): o Over the next 90 days, patient will work with PharmD and providers to achieve LDL goal <  100  . Current regimen:  . Aspirin EC 81 mg daily . Atorvastatin 20 mg daily . Nitroglycerin 0.4 mg every 5 minutes under tongue as needed . Coenzyme Q10 10 mg daily  . Omega-3 fatty acids bid . Interventions: o Reviewed medications.  o Discussed goal of improved cholesterol with addition of new medication.  . Patient self care activities - Over the next 90 days, patient will: o Continue to take medication as precriebed.  o Contact provider or pharmacist with any questions or concerns.  o Have updated lipid panel drawn in November as scheduled.   Diabetes Lab Results  Component Value Date/Time   HGBA1C 5.5 04/04/2020 07:43 AM   HGBA1C 5.8 (H) 12/01/2019 09:04 AM   . Pharmacist Clinical Goal(s): o Over the next 90 days, patient will work with PharmD and providers to maintain A1c goal <7% . Current regimen:   Glucose test strips TID before meals . Interventions: o Discussed diet and exercise.  o Keep up the good work managing healthy blood sugar.  . Patient self care activities - Over the next 90 days, patient will: o Check blood sugar once daily, document, and provide at future appointments o Contact provider with any episodes of hypoglycemia  Medication management . Pharmacist Clinical Goal(s): o Over the next 90 days, patient will work with PharmD and providers to maintain optimal medication adherence . Current pharmacy: [facility] . Interventions o Comprehensive medication review performed. o Continue current medication management strategy . Patient self care activities - Over the next 90 days, patient will: o Focus on medication adherence by pill box o Take medications as prescribed o Report any questions or concerns to PharmD and/or provider(s)  Initial goal documentation       Depression Screen PHQ 2/9 Scores 08/09/2020 07/04/2020 06/07/2020 12/12/2019  PHQ - 2 Score 0 0 0 0    Fall Risk Fall Risk  08/09/2020 07/04/2020 04/06/2020  Falls in the past  year? 0 0 0  Number falls in past yr: 0 0 -  Injury with Fall? 0 - -  Risk for fall due to : History of fall(s) - -    FALL RISK PREVENTION PERTAINING TO THE HOME:  Any stairs in or around the home? No  If so, are there any without  handrails? handrails Home free of loose throw rugs in walkways, pet beds, electrical cords, etc? no concern Adequate lighting in your home to reduce risk of falls?yes  ASSISTIVE DEVICES UTILIZED TO PREVENT FALLS:  Life alert? no Use of a cane, walker or w/c? Cane/walker Grab bars in the bathroom? Grab bar Shower chair or bench in shower? No chair Elevated toilet seat or a handicapped toilet? handles  Cognitive Function: MMSE - Mini Mental State Exam 08/09/2020  Orientation to time 5  Orientation to Place 5  Registration 3  Attention/ Calculation 3  Recall 3  Language- name 2 objects 2  Language- repeat 0  Language- follow 3 step command 3  Language- read & follow direction 1  Write a sentence 0  Copy design 1  Total score 26     6CIT Screen 08/09/2020  What Year? 0 points  What month? 0 points  What time? 0 points  Count back from 20 4 points  Months in reverse 2 points  Repeat phrase 6 points  Total Score 12    Immunizations Immunization History  Administered Date(s) Administered  . PFIZER SARS-COV-2 Vaccination 10/17/2019, 11/07/2019, 06/05/2020    Screening Tests Health Maintenance  Topic Date Due  . FOOT EXAM  Never done  . TETANUS/TDAP  Never done  . PNA vac Low Risk Adult (1 of 2 - PCV13) Never done  . INFLUENZA VACCINE  Never done  . COVID-19 Vaccine (4 - Booster for Pfizer series) 12/04/2020  . OPHTHALMOLOGY EXAM  12/19/2020  . HEMOGLOBIN A1C  01/05/2021    Health Maintenance  Health Maintenance Due  Topic Date Due  . FOOT EXAM  Never done  . TETANUS/TDAP  Never done  . PNA vac Low Risk Adult (1 of 2 - PCV13) Never done  . INFLUENZA VACCINE  Never done   No hearing concerns Vision Screening: Recommended  annual ophthalmology exams for early detection of glaucoma and other disorders of the eye. Is the patient up to date with their annual eye exam? yes-seen 4/21 Who is the provider or what is the name of the office in which the patient attends annual eye exams? Dr. [facility] Cataracts Dental Screening: Recommended annual dental exams for proper oral hygiene Partial Plate    Today's Vitals   08/09/20 1351  BP: (!) 104/52  Pulse: 66  Resp: 18  SpO2: 97%  Weight: 188 lb 3.2 oz (85.4 kg)  Height: 5\' 7"  (1.702 m)   Body mass index is 29.48 kg/m. 1. Acute pain of left knee Ortho referral-pt still with pain after brace. Pt states his brace did not work and his knee is still stiff-request ortho-declines PT  2. Primary hypertension Stop Lasix-recheck blood pressure-low bp today after taking Lasix on a regular basis-pt to stop taking and wear compression socks  3. Medicare annual wellness visit, subsequent Low blood pressure-stop Lasix    Plan:     I have personally reviewed and noted the following in the patient's chart:   . Medical and social history . Use of alcohol, tobacco or illicit drugs  . Current medications and supplements . Functional ability and status . Nutritional status . Physical activity . Advanced directives . List of other physicians . Hospitalizations, surgeries, and ER visits in previous 12 months . Vitals . Screenings to include cognitive, depression, and falls . Referrals and appointments  In addition, I have reviewed and discussed with patient certain preventive protocols, quality metrics, and best practice recommendations. A written personalized care  plan for preventive services as well as general preventive health recommendations were provided to patient.    Mertha Baars, MD   08/09/2020

## 2020-08-09 NOTE — Patient Instructions (Addendum)
Patient Care Team: [name], MD as PCP - General (Family Medicine) Kerry Matthews, [facility] as Pharmacist (Pharmacist)  Dr. Hawks-Rheumatologist-[facility] Dr. Renaldo Fiddler [facility] Dr. Modesto Charon Dr. Cram-Neurosurgery Dr. Ranvenkar-Cardiology  [name] , Thank you for taking time to come for your Medicare Wellness Visit. I appreciate your ongoing commitment to your health goals. Please review the following plan we discussed and let me know if I can assist you in the future.   These are the goals we discussed: Stop Lasix-wear tall socks/ Ortho referral Make appointment with Cardiologist for evaluation-concern for low blood pressure Take blood pressure readings at home  Goals    . Pharmacy Care Plan     CARE PLAN ENTRY (see longitudinal plan of care for additional care plan information)  Current Barriers:  . Chronic Disease Management support, education, and care coordination needs related to Hypertension, Hyperlipidemia, and Diabetes   Hypertension BP Readings from Last 3 Encounters:  04/06/20 120/60  03/06/20 118/60  12/12/19 122/64   . Pharmacist Clinical Goal(s): o Over the next 90 days, patient will work with PharmD and providers to maintain BP goal <130/80 . Current regimen:   Losartan 25 mg daily   Furosemide 20 mg daily  . Interventions: o Reviewed home diet and exercise.  o Reviewed medications.  . Patient self care activities - Over the next 90 days, patient will: o Check BP weekly, document, and provide at future appointments o Ensure daily salt intake < 2300 mg/day  Hyperlipidemia Lab Results  Component Value Date/Time   LDLCALC 111 (H) 04/04/2020 07:43 AM   . Pharmacist Clinical Goal(s): o Over the next 90 days, patient will work with PharmD and providers to achieve LDL goal < 100  . Current regimen:  . Aspirin EC 81 mg daily . Atorvastatin 20 mg daily . Nitroglycerin 0.4 mg every 5 minutes under tongue as  needed . Coenzyme Q10 10 mg daily  . Omega-3 fatty acids bid . Interventions: o Reviewed medications.  o Discussed goal of improved cholesterol with addition of new medication.  . Patient self care activities - Over the next 90 days, patient will: o Continue to take medication as precriebed.  o Contact provider or pharmacist with any questions or concerns.  o Have updated lipid panel drawn in November as scheduled.   Diabetes Lab Results  Component Value Date/Time   HGBA1C 5.5 04/04/2020 07:43 AM   HGBA1C 5.8 (H) 12/01/2019 09:04 AM   . Pharmacist Clinical Goal(s): o Over the next 90 days, patient will work with PharmD and providers to maintain A1c goal <7% . Current regimen:   Glucose test strips TID before meals . Interventions: o Discussed diet and exercise.  o Keep up the good work managing healthy blood sugar.  . Patient self care activities - Over the next 90 days, patient will: o Check blood sugar once daily, document, and provide at future appointments o Contact provider with any episodes of hypoglycemia  Medication management . Pharmacist Clinical Goal(s): o Over the next 90 days, patient will work with PharmD and providers to maintain optimal medication adherence . Current pharmacy: [facility] . Interventions o Comprehensive medication review performed. o Continue current medication management strategy . Patient self care activities - Over the next 90 days, patient will: o Focus on medication adherence by pill box o Take medications as prescribed o Report any questions or concerns to PharmD and/or provider(s)  Initial goal documentation        This  is a list of the screening recommended for you and due dates:  Health Maintenance  Topic Date Due  . Complete foot exam   Never done  . Tetanus Vaccine  Never done  . Pneumonia vaccines (1 of 2 - PCV13) Never done  . Flu Shot  Never done  . COVID-19 Vaccine (4 - Booster for Pfizer series) 12/04/2020  .  Eye exam for diabetics  12/19/2020  . Hemoglobin A1C  01/05/2021

## 2020-08-15 DIAGNOSIS — M1712 Unilateral primary osteoarthritis, left knee: Secondary | ICD-10-CM | POA: Diagnosis not present

## 2020-08-15 DIAGNOSIS — M958 Other specified acquired deformities of musculoskeletal system: Secondary | ICD-10-CM | POA: Diagnosis not present

## 2020-09-04 DIAGNOSIS — F411 Generalized anxiety disorder: Secondary | ICD-10-CM | POA: Insufficient documentation

## 2020-09-04 DIAGNOSIS — J438 Other emphysema: Secondary | ICD-10-CM | POA: Insufficient documentation

## 2020-09-04 DIAGNOSIS — M1990 Unspecified osteoarthritis, unspecified site: Secondary | ICD-10-CM | POA: Insufficient documentation

## 2020-09-04 DIAGNOSIS — N390 Urinary tract infection, site not specified: Secondary | ICD-10-CM | POA: Insufficient documentation

## 2020-09-04 DIAGNOSIS — I2510 Atherosclerotic heart disease of native coronary artery without angina pectoris: Secondary | ICD-10-CM | POA: Insufficient documentation

## 2020-09-04 DIAGNOSIS — F5101 Primary insomnia: Secondary | ICD-10-CM | POA: Insufficient documentation

## 2020-09-04 DIAGNOSIS — E785 Hyperlipidemia, unspecified: Secondary | ICD-10-CM | POA: Insufficient documentation

## 2020-09-04 DIAGNOSIS — C4490 Unspecified malignant neoplasm of skin, unspecified: Secondary | ICD-10-CM | POA: Insufficient documentation

## 2020-09-04 DIAGNOSIS — I10 Essential (primary) hypertension: Secondary | ICD-10-CM | POA: Insufficient documentation

## 2020-09-04 DIAGNOSIS — E119 Type 2 diabetes mellitus without complications: Secondary | ICD-10-CM | POA: Insufficient documentation

## 2020-09-04 DIAGNOSIS — N200 Calculus of kidney: Secondary | ICD-10-CM | POA: Insufficient documentation

## 2020-09-04 DIAGNOSIS — K5190 Ulcerative colitis, unspecified, without complications: Secondary | ICD-10-CM | POA: Insufficient documentation

## 2020-09-06 ENCOUNTER — Ambulatory Visit (INDEPENDENT_AMBULATORY_CARE_PROVIDER_SITE_OTHER): Payer: Medicare HMO | Admitting: Family Medicine

## 2020-09-06 ENCOUNTER — Encounter: Payer: Self-pay | Admitting: Family Medicine

## 2020-09-06 ENCOUNTER — Other Ambulatory Visit: Payer: Self-pay

## 2020-09-06 ENCOUNTER — Encounter: Payer: Self-pay | Admitting: Cardiology

## 2020-09-06 ENCOUNTER — Ambulatory Visit: Payer: Medicare HMO | Admitting: Cardiology

## 2020-09-06 VITALS — BP 120/64 | HR 63 | Ht 67.0 in | Wt 188.0 lb

## 2020-09-06 VITALS — BP 132/56 | HR 61 | Resp 18 | Ht 67.0 in | Wt 188.0 lb

## 2020-09-06 DIAGNOSIS — R319 Hematuria, unspecified: Secondary | ICD-10-CM | POA: Diagnosis not present

## 2020-09-06 DIAGNOSIS — R809 Proteinuria, unspecified: Secondary | ICD-10-CM

## 2020-09-06 DIAGNOSIS — R3915 Urgency of urination: Secondary | ICD-10-CM

## 2020-09-06 DIAGNOSIS — R300 Dysuria: Secondary | ICD-10-CM

## 2020-09-06 DIAGNOSIS — R7989 Other specified abnormal findings of blood chemistry: Secondary | ICD-10-CM

## 2020-09-06 DIAGNOSIS — E782 Mixed hyperlipidemia: Secondary | ICD-10-CM | POA: Diagnosis not present

## 2020-09-06 DIAGNOSIS — I10 Essential (primary) hypertension: Secondary | ICD-10-CM

## 2020-09-06 DIAGNOSIS — I2510 Atherosclerotic heart disease of native coronary artery without angina pectoris: Secondary | ICD-10-CM

## 2020-09-06 DIAGNOSIS — N289 Disorder of kidney and ureter, unspecified: Secondary | ICD-10-CM | POA: Insufficient documentation

## 2020-09-06 HISTORY — DX: Disorder of kidney and ureter, unspecified: N28.9

## 2020-09-06 LAB — POCT URINALYSIS DIP (CLINITEK)
Bilirubin, UA: NEGATIVE
Glucose, UA: NEGATIVE mg/dL
Ketones, POC UA: NEGATIVE mg/dL
Nitrite, UA: POSITIVE — AB
POC PROTEIN,UA: 30 — AB
Spec Grav, UA: 1.025 (ref 1.010–1.025)
Urobilinogen, UA: 0.2 E.U./dL
pH, UA: 6 (ref 5.0–8.0)

## 2020-09-06 MED ORDER — AMOXICILLIN 500 MG PO CAPS: 500.0000 mg | ORAL_CAPSULE | Freq: Two times a day (BID) | ORAL | 0 refills | Status: DC

## 2020-09-06 MED ORDER — NITROGLYCERIN 0.4 MG SL SUBL: 0.4000 mg | SUBLINGUAL_TABLET | SUBLINGUAL | 6 refills | Status: DC | PRN

## 2020-09-06 NOTE — Progress Notes (Signed)
Cardiology Office Note:    Date:  09/06/2020   ID:  Kerry Matthews, DOB [birth year], MRN [id number]  PCP:  [name], MD  Cardiologist:  Jenean Lindau, MD   Referring MD: [name], MD    ASSESSMENT:    1. Coronary artery disease involving native coronary artery of native heart without angina pectoris   2. Essential hypertension   3. Mixed hyperlipidemia   4. Elevated LFTs   5. Renal insufficiency    PLAN:    In order of problems listed above:  1. Coronary artery disease: Secondary prevention stressed with patient.  Importance of compliance with diet medication stressed he vocalized understanding.  He was advised to walk half an hour on a regular basis.  He does well being active and exercising.  Sublingual nitroglycerin prescription was sent, its protocol and 911 protocol explained and the patient vocalized understanding questions were answered to the patient's satisfaction 2. Essential hypertension: Blood pressure stable and lifestyle modification and salt intake issues were discussed. 3. Mixed dyslipidemia: Diet was emphasized.  Lipids were reviewed from [facility] sheet. 4. Renal insufficiency: Stable.  Managed by primary care physician.  Diet was emphasized.  I told him not to take over-the-counter medications unless checking with his primary care.  He understands. 5. Patient will be seen in follow-up appointment in 6 months or earlier if the patient has any concerns    Medication Adjustments/Labs and Tests Ordered: Current medicines are reviewed at length with the patient today.  Concerns regarding medicines are outlined above.  No orders of the defined types were placed in this encounter.  No orders of the defined types were placed in this encounter.    Chief Complaint  Patient presents with  . Follow-up     History of Present Illness:    Kerry Matthews is a 85 y.o. male.  Patient has past medical history of coronary artery disease post stenting, essential  hypertension mixed dyslipidemia and renal insufficiency.  He denies any problems at this time Of activities of daily living.  No chest pain orthopnea or PND.  At the time of my evaluation, the patient is alert awake oriented and in no distress.  Past Medical History:  Diagnosis Date  . 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 Abnormal CT of the chest   . Abnormal findings on diagnostic imaging of lung 04/12/2019   04/01/2019-CT chest with contrast- no acute process in chest abdomen or pelvis, interstitial lung disease suspicious for early or mild UIP, pulmonary artery enlargement suggest PAH   . Acute low back pain without sciatica 03/08/2020  . Acute lower UTI 07/07/2020  . Acute pain of left knee 11/30/2019  . AKI (acute kidney injury) ([place])   . Alpha-1-antitrypsin deficiency carrier 05/04/2019  . Anemia   . Atherosclerotic heart disease of native coronary artery without angina pectoris 05/03/2018  . Bradycardia   . CKD (chronic kidney disease) 05/03/2018  . Community acquired pneumonia   . Coronary artery disease   . Cough   . Diabetes ([place])   . Diabetes mellitus type 2 in obese ([place]) 04/01/2019  . Dyslipidemia associated with type 2 diabetes mellitus ([place]) 05/03/2018  . Elevated LFTs 04/01/2019  . Elevated rheumatoid factor 04/12/2019   04/03/2019-rheumatoid factor-95.4  . Emphysema (subcutaneous) (surgical) resulting from a procedure 05/03/2018  .  Essential hypertension 05/03/2018  . Fever   . GAD (generalized anxiety disorder)   . GERD (gastroesophageal reflux disease)   . History of alpha-1-antitrypsin deficiency 04/12/2019  . Hyperlipidemia   . Hypertension   . Hypoxemia   . Insomnia 05/03/2018  . Interstitial pulmonary disease ([place]) 04/12/2019   04/01/2019-CT chest with contrast- no acute process in  chest abdomen or pelvis, interstitial lung disease suspicious for early or mild UIP, pulmonary artery enlargement suggest PAH  04/02/2019-connective tissue work-up: Anti-Jo 1-negative Anti-DNA antibody double-stranded-negative Anti-scleroderma antibody-negative Sjogren's syndrome antibody-negative Sjogren's syndrome antibody-negative CK-31 CCP-6 E  . Leukocytosis 03/08/2020  . Low vitamin B12 level 04/03/2019  . Lower extremity edema   . Medicare annual wellness visit, subsequent 08/09/2020  . Mixed hyperlipidemia 05/03/2018  . Myalgia 04/19/2019  . Osteoarthritis   . Other emphysema ([place])   . Paresthesias in left hand 11/30/2019  . Pneumonia 04/01/2019  . Pneumonitis   . Polymyositis ([place])   . Primary insomnia   . Renal stones   . Sepsis ([place]) 04/19/2019  . Severe sepsis ([place]) 07/07/2020  . Skin cancer   . Transaminitis   . Trigger finger 05/03/2018  . UC (ulcerative colitis) ([place])   . UTI (urinary tract infection)     Past Surgical History:  Procedure Laterality Date  . ANGIOPLASTY    . BACK SURGERY    . CATARACT EXTRACTION    . COLONOSCOPY  06/16/2005   Mild colitis involving splenic flexure. Colonic polyps, status post polypectomy. Mild pancolonic diverticulitits. Internal hemorrhoids.   . ESOPHAGOGASTRODUODENOSCOPY  04/26/2003   Irregular Z line suggestive of GERD. Mild gastritis status post CLO testing.   Marland Kitchen TRIGGER FINGER RELEASE      Current Medications: Current Meds  Medication Sig  . acetaminophen (TYLENOL) 500 MG tablet Take 500-1,000 mg by mouth every 6 (six) hours as needed for fever or headache (pain).  Marland Kitchen aspirin 81 MG EC tablet Take 81 mg by mouth daily before breakfast.   . atorvastatin (LIPITOR) 20 MG tablet Take 1 tablet (20 mg total) by mouth at bedtime.  . Coenzyme Q10 (CO Q 10) 100 MG CAPS Take 100 mg by mouth at bedtime.   . furosemide (LASIX) 20 MG tablet Take 20 mg by mouth as needed.  . gabapentin (NEURONTIN) 400 MG capsule TAKE 1 CAPSULE THREE TIMES DAILY  .  glucose blood (TRUE METRIX BLOOD GLUCOSE TEST) test strip TEST BLOOD SUGAR THREE TIMES DAILY BEFORE MEALS  . losartan (COZAAR) 25 MG tablet Take 25 mg by mouth daily.  . Multiple Vitamins-Minerals (PRESERVISION AREDS 2) CAPS Take 1 capsule by mouth 2 (two) times daily after a meal.  . nitroGLYCERIN (NITROSTAT) 0.4 MG SL tablet Place 0.4 mg under the tongue every 5 (five) minutes as needed for chest pain.   . Omega-3 Fatty Acids (FISH OIL) 1000 MG CAPS Take 1,000 mg by mouth 2 (two) times daily.   Marland Kitchen omeprazole (PRILOSEC) 20 MG capsule Take 20 mg by mouth daily before breakfast.   . PRESCRIPTION MEDICATION every 6 (six) months. Infusion related to auto-immune issues - Dr Lenna Gilford, [place].  . vitamin B-12 (CYANOCOBALAMIN) 1000 MCG tablet Take 1 tablet (1,000 mcg total) by mouth daily. (Patient taking differently: Take 1,000 mcg by mouth daily before breakfast.)     Allergies:   Ace inhibitors, Hydrocodone, Hydrocodone-acetaminophen, and Sulfamethoxazole-trimethoprim   Social History   Socioeconomic History  . Marital status: Married    Spouse name: Not on file  . Number of children:  2  . Years of education: Not on file  . Highest education level: Not on file  Occupational History  . Occupation: retired  Tobacco Use  . Smoking status: Never Smoker  . Smokeless tobacco: Never Used  Vaping Use  . Vaping Use: Never used  Substance and Sexual Activity  . Alcohol use: Never  . Drug use: Never  . Sexual activity: Not on file  Other Topics Concern  . Not on file  Social History Narrative  . Not on file   Social Determinants of Health   Financial Resource Strain: Not on file  Food Insecurity: No Food Insecurity  . Worried About Charity fundraiser in the Last Year: Never true  . Ran Out of Food in the Last Year: Never true  Transportation Needs: No Transportation Needs  . Lack of Transportation (Medical): No  . Lack of Transportation (Non-Medical): No  Physical Activity:  Sufficiently Active  . Days of Exercise per Week: 5 days  . Minutes of Exercise per Session: 30 min  Stress: Not on file  Social Connections: Not on file     Family History: The patient's family history includes Clotting disorder in his brother; Heart attack in his sister; Lung disease in his sister; Pancreatic cancer in his sister; Stroke in his father; Tuberculosis in his mother. There is no history of Colon cancer or Esophageal cancer.  ROS:   Please see the history of present illness.    All other systems reviewed and are negative.  EKGs/Labs/Other Studies Reviewed:    The following studies were reviewed today: EKG reveals sinus rhythm and nonspecific ST-T changes   Recent Labs: 12/01/2019: TSH 2.690 07/09/2020: Magnesium 1.8 07/17/2020: Hemoglobin 12.4; Platelets 498 07/26/2020: ALT 23; BUN 20; Creatinine, Ser 1.37; Potassium 4.8; Sodium 140  Recent Lipid Panel    Component Value Date/Time   CHOL 168 04/04/2020 0743   TRIG 76 04/04/2020 0743   HDL 43 04/04/2020 0743   CHOLHDL 3.9 04/04/2020 0743   LDLCALC 111 (H) 04/04/2020 0743    Physical Exam:    VS:  BP 120/64 (BP Location: Left Arm, Patient Position: Sitting, Cuff Size: Normal)   Pulse 63   Ht 5\' 7"  (1.702 m)   Wt 188 lb (85.3 kg)   SpO2 95%   BMI 29.44 kg/m     Wt Readings from Last 3 Encounters:  09/06/20 188 lb (85.3 kg)  08/09/20 188 lb 3.2 oz (85.4 kg)  07/17/20 189 lb (85.7 kg)     GEN: Patient is in no acute distress HEENT: Normal NECK: No JVD; No carotid bruits LYMPHATICS: No lymphadenopathy CARDIAC: Hear sounds regular, 2/6 systolic murmur at the apex. RESPIRATORY:  Clear to auscultation without rales, wheezing or rhonchi  ABDOMEN: Soft, non-tender, non-distended MUSCULOSKELETAL:  No edema; No deformity  SKIN: Warm and dry NEUROLOGIC:  Alert and oriented x 3 PSYCHIATRIC:  Normal affect   Signed, Jenean Lindau, MD  09/06/2020 9:10 AM    [place]

## 2020-09-06 NOTE — Patient Instructions (Signed)

## 2020-09-06 NOTE — Progress Notes (Signed)
Acute Office Visit  Subjective:    Patient ID: Kerry Matthews, male    DOB: 1936/02/01, 85 y.o.   MRN: [id number]  Chief Complaint  Patient presents with  . Urinary Urgency  . Dysuria    07/04/20-urine culture-resistant to sulfa and cipro/Levo Blood culture no growth-Cr 1.37, GFR 48 Component 2 mo ago  Urine Culture, Routine Final reportAbnormal   Organism ID, Bacteria Escherichia coliAbnormal   Comment: Cefazolin <=4 ug/mL  Cefazolin with an MIC <=16 predicts susceptibility to the oral agents  cefaclor, cefdinir, cefpodoxime, cefprozil, cefuroxime, cephalexin,  and loracarbef when used for therapy of uncomplicated urinary tract  infections due to E. coli, Klebsiella pneumoniae, and Proteus  mirabilis.  Greater than 100,000 colony forming units per mL   ORGANISM ID, BACTERIA Comment   Comment: Mixed urogenital flora  10,000-25,000 colony forming units per mL   Antimicrobial Susceptibility Comment   Comment:   ** S = Susceptible; I = Intermediate; R = Resistant **           P = Positive; N = Negative        MICS are expressed in micrograms per mL   Antibiotic         RSLT#1  RSLT#2  RSLT#3  RSLT#4  Amoxicillin/Clavulanic Acid  S  Ampicillin           S  Cefepime            S  Ceftriaxone          S  Cefuroxime           S  Ciprofloxacin         R  Ertapenem           S  Gentamicin           S  Imipenem            S  Levofloxacin          R  Meropenem           S  Nitrofurantoin         S  Piperacillin/Tazobactam    S  Tetracycline          S  Tobramycin           S  Trimethoprim/Sulfa       R   Resulting Agency [facility]      HPI Patient is in today for urinary urgency, painful urination. No blood.  No increase in back or abdominal pain.  Acute onset yesterday.  Pt with h/o e coli  sepsis-no urology following  Past Medical History:  Diagnosis Date  . 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 Abnormal CT of the chest   . Abnormal findings on diagnostic imaging of lung 04/12/2019   04/01/2019-CT chest with contrast- no acute process in chest abdomen or pelvis, interstitial lung disease suspicious for early or mild UIP, pulmonary artery enlargement suggest PAH   . Acute low back pain without sciatica 03/08/2020  . Acute lower UTI 07/07/2020  . Acute pain of left knee 11/30/2019  . AKI (acute kidney injury) ([place])   . Alpha-1-antitrypsin deficiency carrier 05/04/2019  . Anemia   . Atherosclerotic heart disease of native coronary artery without angina pectoris 05/03/2018  . Bradycardia   . CKD (chronic  kidney disease) 05/03/2018  . Community acquired pneumonia   . Coronary artery disease   . Cough   . Diabetes ([place])   . Diabetes mellitus type 2 in obese ([place]) 04/01/2019  . Dyslipidemia associated with type 2 diabetes mellitus ([place]) 05/03/2018  . Elevated LFTs 04/01/2019  . Elevated rheumatoid factor 04/12/2019   04/03/2019-rheumatoid factor-95.4  . Emphysema (subcutaneous) (surgical) resulting from a procedure 05/03/2018  . Essential hypertension 05/03/2018  . Fever   . GAD (generalized anxiety disorder)   . GERD (gastroesophageal reflux disease)   . History of alpha-1-antitrypsin deficiency 04/12/2019  . Hyperlipidemia   . Hypertension   . Hypoxemia   . Insomnia 05/03/2018  . Interstitial pulmonary disease ([place]) 04/12/2019   04/01/2019-CT chest with contrast- no acute process in chest abdomen or pelvis, interstitial lung disease suspicious for early or mild UIP, pulmonary artery enlargement suggest PAH  04/02/2019-connective tissue work-up: Anti-Jo 1-negative Anti-DNA antibody  double-stranded-negative Anti-scleroderma antibody-negative Sjogren's syndrome antibody-negative Sjogren's syndrome antibody-negative CK-31 CCP-6 E  . Leukocytosis 03/08/2020  . Low vitamin B12 level 04/03/2019  . Lower extremity edema   . Medicare annual wellness visit, subsequent 08/09/2020  . Mixed hyperlipidemia 05/03/2018  . Myalgia 04/19/2019  . Osteoarthritis   . Other emphysema ([place])   . Paresthesias in left hand 11/30/2019  . Pneumonia 04/01/2019  . Pneumonitis   . Polymyositis ([place])   . Primary insomnia   . Renal stones   . Sepsis ([place]) 04/19/2019  . Severe sepsis ([place]) 07/07/2020  . Skin cancer   . Transaminitis   . Trigger finger 05/03/2018  . UC (ulcerative colitis) ([place])   . UTI (urinary tract infection)     Past Surgical History:  Procedure Laterality Date  . ANGIOPLASTY    . BACK SURGERY    . CATARACT EXTRACTION    . COLONOSCOPY  06/16/2005   Mild colitis involving splenic flexure. Colonic polyps, status post polypectomy. Mild pancolonic diverticulitits. Internal hemorrhoids.   . ESOPHAGOGASTRODUODENOSCOPY  04/26/2003   Irregular Z line suggestive of GERD. Mild gastritis status post CLO testing.   Marland Kitchen TRIGGER FINGER RELEASE      Family History  Problem Relation Age of Onset  . Tuberculosis Mother   . Stroke Father   . Pancreatic cancer Sister   . Heart attack Sister   . Lung disease Sister   . Clotting disorder Brother   . Colon cancer Neg Hx   . Esophageal cancer Neg Hx     Social History   Socioeconomic History  . Marital status: Married    Spouse name: Not on file  . Number of children: 2  . Years of education: Not on file  . Highest education level: Not on file  Occupational History  . Occupation: retired  Tobacco Use  . Smoking status: Never Smoker  . Smokeless tobacco: Never Used  Vaping Use  . Vaping Use: Never used  Substance and Sexual Activity  . Alcohol use: Never  . Drug use: Never  . Sexual activity: Not on file  Other Topics Concern   . Not on file  Social History Narrative  . Not on file   Social Determinants of Health   Financial Resource Strain: Not on file  Food Insecurity: No Food Insecurity  . Worried About Charity fundraiser in the Last Year: Never true  . Ran Out of Food in the Last Year: Never true  Transportation Needs: No Transportation Needs  . Lack of Transportation (Medical): No  . Lack of  Transportation (Non-Medical): No  Physical Activity: Sufficiently Active  . Days of Exercise per Week: 5 days  . Minutes of Exercise per Session: 30 min  Stress: Not on file  Social Connections: Not on file  Intimate Partner Violence: Not on file    Outpatient Medications Prior to Visit  Medication Sig Dispense Refill  . acetaminophen (TYLENOL) 500 MG tablet Take 500-1,000 mg by mouth every 6 (six) hours as needed for fever or headache (pain).    Marland Kitchen aspirin 81 MG EC tablet Take 81 mg by mouth daily before breakfast.     . atorvastatin (LIPITOR) 20 MG tablet Take 1 tablet (20 mg total) by mouth at bedtime. 90 tablet 0  . Coenzyme Q10 (CO Q 10) 100 MG CAPS Take 100 mg by mouth at bedtime.     . furosemide (LASIX) 20 MG tablet Take 20 mg by mouth as needed.    . gabapentin (NEURONTIN) 400 MG capsule TAKE 1 CAPSULE THREE TIMES DAILY 270 capsule 0  . glucose blood (TRUE METRIX BLOOD GLUCOSE TEST) test strip TEST BLOOD SUGAR THREE TIMES DAILY BEFORE MEALS 300 strip 3  . losartan (COZAAR) 25 MG tablet Take 25 mg by mouth daily.    . Multiple Vitamins-Minerals (PRESERVISION AREDS 2) CAPS Take 1 capsule by mouth 2 (two) times daily after a meal.    . nitroGLYCERIN (NITROSTAT) 0.4 MG SL tablet Place 1 tablet (0.4 mg total) under the tongue every 5 (five) minutes as needed for chest pain. 25 tablet 6  . Omega-3 Fatty Acids (FISH OIL) 1000 MG CAPS Take 1,000 mg by mouth 2 (two) times daily.     Marland Kitchen omeprazole (PRILOSEC) 20 MG capsule Take 20 mg by mouth daily before breakfast.     . PRESCRIPTION MEDICATION every 6 (six)  months. Infusion related to auto-immune issues - Dr Lenna Gilford, [place].    . vitamin B-12 (CYANOCOBALAMIN) 1000 MCG tablet Take 1 tablet (1,000 mcg total) by mouth daily. (Patient taking differently: Take 1,000 mcg by mouth daily before breakfast.) 30 tablet 0   No facility-administered medications prior to visit.    Allergies  Allergen Reactions  . Ace Inhibitors Other (See Comments)    Slow heart rate  . Hydrocodone Nausea And Vomiting  . Hydrocodone-Acetaminophen Nausea And Vomiting  . Sulfamethoxazole-Trimethoprim Nausea And Vomiting    Review of Systems  Constitutional: Negative for fever.  Genitourinary: Positive for difficulty urinating, dysuria and urgency. Negative for flank pain and hematuria.       Objective:    Physical Exam Constitutional:      Appearance: Normal appearance.  Cardiovascular:     Rate and Rhythm: Normal rate and regular rhythm.     Pulses: Normal pulses.     Heart sounds: Normal heart sounds.  Pulmonary:     Effort: Pulmonary effort is normal.     Breath sounds: Normal breath sounds.  Abdominal:     Tenderness: There is no right CVA tenderness or left CVA tenderness.  Neurological:     Mental Status: He is alert.     BP (!) 132/56   Pulse 61   Resp 18   Ht 5\' 7"  (1.702 m)   Wt 188 lb (85.3 kg)   SpO2 97%   BMI 29.44 kg/m  Wt Readings from Last 3 Encounters:  09/06/20 188 lb (85.3 kg)  09/06/20 188 lb (85.3 kg)  08/09/20 188 lb 3.2 oz (85.4 kg)    Health Maintenance Due  Topic Date Due  .  FOOT EXAM  Never done   Lab Results  Component Value Date   TSH 2.690 12/01/2019   Lab Results  Component Value Date   WBC 7.1 07/17/2020   HGB 12.4 (L) 07/17/2020   HCT 36.8 (L) 07/17/2020   MCV 87 07/17/2020   PLT 498 (H) 07/17/2020   Lab Results  Component Value Date   NA 140 07/26/2020   K 4.8 07/26/2020   CO2 21 07/26/2020   GLUCOSE 114 (H) 07/26/2020   BUN 20 07/26/2020   CREATININE 1.37 (H) 07/26/2020   BILITOT 0.6  07/26/2020   ALKPHOS 137 (H) 07/26/2020   AST 26 07/26/2020   ALT 23 07/26/2020   PROT 6.2 07/26/2020   ALBUMIN 4.1 07/26/2020   CALCIUM 8.8 07/26/2020   ANIONGAP 7 07/09/2020   Lab Results  Component Value Date   CHOL 168 04/04/2020   Lab Results  Component Value Date   HDL 43 04/04/2020   Lab Results  Component Value Date   LDLCALC 111 (H) 04/04/2020   Lab Results  Component Value Date   TRIG 76 04/04/2020   Lab Results  Component Value Date   CHOLHDL 3.9 04/04/2020   Lab Results  Component Value Date   HGBA1C 5.7 (H) 07/08/2020       Assessment & Plan:  1. Hematuria, unspecified type-none noted by pt -seen on urinalysis - Urine Culture - Ambulatory referral to Urology  2. Proteinuria, unspecified type - Urine Culture - Ambulatory referral to Urology  3. Dysuria - POCT URINALYSIS DIP (CLINITEK) - Ambulatory referral to Urology Concern for h/o admission 11/13-11/5 e coli urinary infection-cipro/levo/sulfa resistant-Rocephin given-renal ultrasound with no obstruction, enlarged prostate. Pt given omnicef at discharge for treatment.  4. Urinary urgency - POCT URINALYSIS DIP (CLINITEK) - Ambulatory referral to Urology  Sweet Jarvis Hannah Beat, MD

## 2020-09-06 NOTE — Patient Instructions (Addendum)
Urinary Tract Infection, Adult A urinary tract infection (UTI) is an infection of any part of the urinary tract. The urinary tract includes:  The kidneys.  The ureters.  The bladder.  The urethra. These organs make, store, and get rid of pee (urine) in the body. What are the causes? This infection is caused by germs (bacteria) in your genital area. These germs grow and cause swelling (inflammation) of your urinary tract. What increases the risk? The following factors may make you more likely to develop this condition:  Using a small, thin tube (catheter) to drain pee.  Not being able to control when you pee or poop (incontinence).  Being male. If you are male, these things can increase the risk: ? Using these methods to prevent pregnancy:  A medicine that kills sperm (spermicide).  A device that blocks sperm (diaphragm). ? Having low levels of a male hormone (estrogen). ? Being pregnant. You are more likely to develop this condition if:  You have genes that add to your risk.  You are sexually active.  You take antibiotic medicines.  You have trouble peeing because of: ? A prostate that is bigger than normal, if you are male. ? A blockage in the part of your body that drains pee from the bladder. ? A kidney stone. ? A nerve condition that affects your bladder. ? Not getting enough to drink. ? Not peeing often enough.  You have other conditions, such as: ? Diabetes. ? A weak disease-fighting system (immune system). ? Sickle cell disease. ? Gout. ? Injury of the spine. What are the signs or symptoms? Symptoms of this condition include:  Needing to pee right away.  Peeing small amounts often.  Pain or burning when peeing.  Blood in the pee.  Pee that smells bad or not like normal.  Trouble peeing.  Pee that is cloudy.  Fluid coming from the vagina, if you are male.  Pain in the belly or lower back. Other symptoms include:  Vomiting.  Not  feeling hungry.  Feeling mixed up (confused). This may be the first symptom in older adults.  Being tired and grouchy (irritable).  A fever.  Watery poop (diarrhea). How is this treated?  Taking antibiotic medicine.  Taking other medicines.  Drinking enough water. In some cases, you may need to see a specialist. Follow these instructions at home: Medicines  Take over-the-counter and prescription medicines only as told by your doctor.  If you were prescribed an antibiotic medicine, take it as told by your doctor. Do not stop taking it even if you start to feel better. General instructions  Make sure you: ? Pee until your bladder is empty. ? Do not hold pee for a long time. ? Empty your bladder after sex. ? Wipe from front to back after peeing or pooping if you are a male. Use each tissue one time when you wipe.  Drink enough fluid to keep your pee pale yellow.  Keep all follow-up visits.   Contact a doctor if:  You do not get better after 1-2 days.  Your symptoms go away and then come back. Get help right away if:  You have very bad back pain.  You have very bad pain in your lower belly.  You have a fever.  You have chills.  You feeling like you will vomit or you vomit. Summary  A urinary tract infection (UTI) is an infection of any part of the urinary tract.  This condition is caused by   germs in your genital area.  There are many risk factors for a UTI.  Treatment includes antibiotic medicines.  Drink enough fluid to keep your pee pale yellow. This information is not intended to replace advice given to you by your health care provider. Make sure you discuss any questions you have with your health care provider. Document Revised: 03/23/2020 Document Reviewed: 03/23/2020 Elsevier Patient Education  [place]. Urology referral-recurrent UTI concerning

## 2020-09-07 DIAGNOSIS — R809 Proteinuria, unspecified: Secondary | ICD-10-CM | POA: Insufficient documentation

## 2020-09-07 DIAGNOSIS — R3915 Urgency of urination: Secondary | ICD-10-CM

## 2020-09-07 DIAGNOSIS — R319 Hematuria, unspecified: Secondary | ICD-10-CM | POA: Insufficient documentation

## 2020-09-07 DIAGNOSIS — R300 Dysuria: Secondary | ICD-10-CM

## 2020-09-07 HISTORY — DX: Dysuria: R30.0

## 2020-09-07 HISTORY — DX: Urgency of urination: R39.15

## 2020-09-07 HISTORY — DX: Hematuria, unspecified: R31.9

## 2020-09-07 HISTORY — DX: Proteinuria, unspecified: R80.9

## 2020-09-09 LAB — URINE CULTURE

## 2020-09-13 ENCOUNTER — Ambulatory Visit (INDEPENDENT_AMBULATORY_CARE_PROVIDER_SITE_OTHER): Payer: Medicare HMO | Admitting: Family Medicine

## 2020-09-13 ENCOUNTER — Other Ambulatory Visit: Payer: Self-pay

## 2020-09-13 VITALS — BP 132/58 | HR 60 | Temp 97.3°F | Resp 16 | Ht 67.0 in | Wt 192.0 lb

## 2020-09-13 DIAGNOSIS — M1712 Unilateral primary osteoarthritis, left knee: Secondary | ICD-10-CM

## 2020-09-13 DIAGNOSIS — R319 Hematuria, unspecified: Secondary | ICD-10-CM

## 2020-09-13 DIAGNOSIS — R809 Proteinuria, unspecified: Secondary | ICD-10-CM

## 2020-09-13 DIAGNOSIS — R3129 Other microscopic hematuria: Secondary | ICD-10-CM | POA: Diagnosis not present

## 2020-09-13 DIAGNOSIS — Z87440 Personal history of urinary (tract) infections: Secondary | ICD-10-CM | POA: Diagnosis not present

## 2020-09-13 LAB — POCT URINALYSIS DIP (CLINITEK)
Bilirubin, UA: NEGATIVE
Blood, UA: NEGATIVE
Glucose, UA: NEGATIVE mg/dL
Ketones, POC UA: NEGATIVE mg/dL
Leukocytes, UA: NEGATIVE
Nitrite, UA: NEGATIVE
POC PROTEIN,UA: NEGATIVE
Spec Grav, UA: 1.025 (ref 1.010–1.025)
Urobilinogen, UA: 0.2 E.U./dL
pH, UA: 6 (ref 5.0–8.0)

## 2020-09-13 MED ORDER — TRIAMCINOLONE ACETONIDE 40 MG/ML IJ SUSP
80.0000 mg | Freq: Once | INTRAMUSCULAR | Status: AC
  Administered 2020-09-13: 80 mg via INTRA_ARTICULAR

## 2020-09-13 NOTE — Progress Notes (Signed)
Acute Office Visit  Subjective:    Patient ID: Kerry Matthews, male    DOB: 04-Sep-1935, 85 y.o.   MRN: [id number]  Chief Complaint  Patient presents with  . Follow-up  . Hematuria  . Urinary Tract Infection    HPI Patient was seen for bladder infection. Treated for uti with amoxicillin as culture grew out e coli sensitive to this. He was in hospital for urosepsis in November 2021. Seen by Dr. Holly Bodily who referred him to urology, due to concern for increased risk of recurrent UTIs.   Left knee OA: [facility] recommended TKR. Previously saw Dr. Erlinda Hong (ortho) in [place] and would like to return to see him. In the past I have given him a kenalog injection and this has helped for up to a year. He is requesting this today. He is unsure if he wishes to proceed with TKR, but want to see Dr. Erlinda Hong.   Past Medical History:  Diagnosis Date  . 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 Abnormal CT of the chest   . Abnormal findings on diagnostic imaging of lung 04/12/2019   04/01/2019-CT chest with contrast- no acute process in chest abdomen or pelvis, interstitial lung disease suspicious for early or mild UIP, pulmonary artery enlargement suggest PAH   . Acute low back pain without sciatica 03/08/2020  . Acute lower UTI 07/07/2020  . Acute pain of left knee 11/30/2019  . AKI (acute kidney injury) ([place])   . Alpha-1-antitrypsin deficiency carrier 05/04/2019  . Anemia   . Atherosclerotic heart disease of native coronary artery without angina pectoris 05/03/2018  . Bradycardia   . CKD (chronic kidney disease) 05/03/2018  . Community acquired pneumonia   . Coronary artery disease   . Cough   . Diabetes ([place])   . Diabetes mellitus type 2 in obese ([place]) 04/01/2019  . Dyslipidemia associated with type 2 diabetes  mellitus ([place]) 05/03/2018  . Elevated LFTs 04/01/2019  . Elevated rheumatoid factor 04/12/2019   04/03/2019-rheumatoid factor-95.4  . Emphysema (subcutaneous) (surgical) resulting from a procedure 05/03/2018  . Essential hypertension 05/03/2018  . Fever   . GAD (generalized anxiety disorder)   . GERD (gastroesophageal reflux disease)   . History of alpha-1-antitrypsin deficiency 04/12/2019  . Hyperlipidemia   . Hypertension   . Hypoxemia   . Insomnia 05/03/2018  . Interstitial pulmonary disease ([place]) 04/12/2019   04/01/2019-CT chest with contrast- no acute process in chest abdomen or pelvis, interstitial lung disease suspicious for early or mild UIP, pulmonary artery enlargement suggest PAH  04/02/2019-connective tissue work-up: Anti-Jo 1-negative Anti-DNA antibody double-stranded-negative Anti-scleroderma antibody-negative Sjogren's syndrome antibody-negative Sjogren's syndrome antibody-negative CK-31 CCP-6 E  . Leukocytosis 03/08/2020  . Low vitamin B12 level 04/03/2019  . Lower extremity edema   . Medicare annual wellness visit, subsequent 08/09/2020  . Mixed hyperlipidemia 05/03/2018  . Myalgia 04/19/2019  . Osteoarthritis   . Other emphysema ([place])   . Paresthesias in left hand 11/30/2019  . Pneumonia 04/01/2019  . Pneumonitis   . Polymyositis ([place])   . Primary insomnia   . Renal stones   . Sepsis ([place]) 04/19/2019  . Severe sepsis ([place]) 07/07/2020  . Skin cancer   . Transaminitis   . Trigger finger 05/03/2018  . UC (ulcerative colitis) ([place])   . UTI (urinary tract infection)  Past Surgical History:  Procedure Laterality Date  . ANGIOPLASTY    . BACK SURGERY    . CATARACT EXTRACTION    . COLONOSCOPY  06/16/2005   Mild colitis involving splenic flexure. Colonic polyps, status post polypectomy. Mild pancolonic diverticulitits. Internal hemorrhoids.   . ESOPHAGOGASTRODUODENOSCOPY  04/26/2003   Irregular Z line suggestive of GERD. Mild gastritis status post CLO testing.   Marland Kitchen TRIGGER FINGER RELEASE       Family History  Problem Relation Age of Onset  . Tuberculosis Mother   . Stroke Father   . Pancreatic cancer Sister   . Heart attack Sister   . Lung disease Sister   . Clotting disorder Brother   . Colon cancer Neg Hx   . Esophageal cancer Neg Hx     Social History   Socioeconomic History  . Marital status: Married    Spouse name: Not on file  . Number of children: 2  . Years of education: Not on file  . Highest education level: Not on file  Occupational History  . Occupation: retired  Tobacco Use  . Smoking status: Never Smoker  . Smokeless tobacco: Never Used  Vaping Use  . Vaping Use: Never used  Substance and Sexual Activity  . Alcohol use: Never  . Drug use: Never  . Sexual activity: Not on file  Other Topics Concern  . Not on file  Social History Narrative  . Not on file   Social Determinants of Health   Financial Resource Strain: Not on file  Food Insecurity: No Food Insecurity  . Worried About Charity fundraiser in the Last Year: Never true  . Ran Out of Food in the Last Year: Never true  Transportation Needs: No Transportation Needs  . Lack of Transportation (Medical): No  . Lack of Transportation (Non-Medical): No  Physical Activity: Sufficiently Active  . Days of Exercise per Week: 5 days  . Minutes of Exercise per Session: 30 min  Stress: Not on file  Social Connections: Not on file  Intimate Partner Violence: Not on file    Outpatient Medications Prior to Visit  Medication Sig Dispense Refill  . acetaminophen (TYLENOL) 500 MG tablet Take 500-1,000 mg by mouth every 6 (six) hours as needed for fever or headache (pain).    Marland Kitchen amoxicillin (AMOXIL) 500 MG capsule Take 1 capsule (500 mg total) by mouth 2 (two) times daily. 14 capsule 0  . aspirin 81 MG EC tablet Take 81 mg by mouth daily before breakfast.     . atorvastatin (LIPITOR) 20 MG tablet Take 1 tablet (20 mg total) by mouth at bedtime. 90 tablet 0  . Coenzyme Q10 (CO Q 10) 100 MG CAPS  Take 100 mg by mouth at bedtime.     . furosemide (LASIX) 20 MG tablet Take 20 mg by mouth as needed.    . gabapentin (NEURONTIN) 400 MG capsule TAKE 1 CAPSULE THREE TIMES DAILY 270 capsule 0  . glucose blood (TRUE METRIX BLOOD GLUCOSE TEST) test strip TEST BLOOD SUGAR THREE TIMES DAILY BEFORE MEALS 300 strip 3  . losartan (COZAAR) 25 MG tablet Take 25 mg by mouth daily.    . Multiple Vitamins-Minerals (PRESERVISION AREDS 2) CAPS Take 1 capsule by mouth 2 (two) times daily after a meal.    . nitroGLYCERIN (NITROSTAT) 0.4 MG SL tablet Place 1 tablet (0.4 mg total) under the tongue every 5 (five) minutes as needed for chest pain. 25 tablet 6  . Omega-3 Fatty Acids (  FISH OIL) 1000 MG CAPS Take 1,000 mg by mouth 2 (two) times daily.     Marland Kitchen omeprazole (PRILOSEC) 20 MG capsule Take 20 mg by mouth daily before breakfast.     . PRESCRIPTION MEDICATION every 6 (six) months. Infusion related to auto-immune issues - Dr Lenna Gilford, [place].    . vitamin B-12 (CYANOCOBALAMIN) 1000 MCG tablet Take 1 tablet (1,000 mcg total) by mouth daily. (Patient taking differently: Take 1,000 mcg by mouth daily before breakfast.) 30 tablet 0   No facility-administered medications prior to visit.    Allergies  Allergen Reactions  . Ace Inhibitors Other (See Comments)    Slow heart rate  . Hydrocodone Nausea And Vomiting  . Hydrocodone-Acetaminophen Nausea And Vomiting  . Sulfamethoxazole-Trimethoprim Nausea And Vomiting    Review of Systems  Constitutional: Negative for chills and fever.  HENT: Negative for congestion, rhinorrhea and sore throat.   Respiratory: Negative for cough and shortness of breath.   Cardiovascular: Negative for chest pain and palpitations.  Gastrointestinal: Negative for abdominal pain, constipation, diarrhea, nausea and vomiting.  Genitourinary: Negative for dysuria and urgency.  Musculoskeletal: Positive for arthralgias (left knee pain ) and back pain. Negative for myalgias.   Neurological: Negative for dizziness and headaches.  Psychiatric/Behavioral: Negative for dysphoric mood. The patient is not nervous/anxious.        Objective:    Physical Exam Vitals reviewed.  Constitutional:      Appearance: Normal appearance.  Cardiovascular:     Rate and Rhythm: Normal rate and regular rhythm.     Heart sounds: Normal heart sounds.  Pulmonary:     Effort: Pulmonary effort is normal.     Breath sounds: Normal breath sounds. No wheezing, rhonchi or rales.  Abdominal:     General: Bowel sounds are normal.     Palpations: Abdomen is soft.     Tenderness: There is no abdominal tenderness.  Musculoskeletal:        General: Tenderness (left knee. patellar apprehension present. Discomfort with flexion.Marland Kitchen) present.  Neurological:     Mental Status: He is alert.  Psychiatric:        Mood and Affect: Mood normal.        Behavior: Behavior normal.     BP (!) 132/58   Pulse 60   Temp (!) 97.3 F (36.3 C)   Resp 16   Ht 5\' 7"  (1.702 m)   Wt 192 lb (87.1 kg)   BMI 30.07 kg/m  Wt Readings from Last 3 Encounters:  09/13/20 192 lb (87.1 kg)  09/06/20 188 lb (85.3 kg)  09/06/20 188 lb (85.3 kg)    Health Maintenance Due  Topic Date Due  . FOOT EXAM  Never done    There are no preventive care reminders to display for this patient.   Lab Results  Component Value Date   TSH 2.690 12/01/2019   Lab Results  Component Value Date   WBC 7.1 07/17/2020   HGB 12.4 (L) 07/17/2020   HCT 36.8 (L) 07/17/2020   MCV 87 07/17/2020   PLT 498 (H) 07/17/2020   Lab Results  Component Value Date   NA 140 07/26/2020   K 4.8 07/26/2020   CO2 21 07/26/2020   GLUCOSE 114 (H) 07/26/2020   BUN 20 07/26/2020   CREATININE 1.37 (H) 07/26/2020   BILITOT 0.6 07/26/2020   ALKPHOS 137 (H) 07/26/2020   AST 26 07/26/2020   ALT 23 07/26/2020   PROT 6.2 07/26/2020   ALBUMIN 4.1  07/26/2020   CALCIUM 8.8 07/26/2020   ANIONGAP 7 07/09/2020   Lab Results  Component Value  Date   CHOL 168 04/04/2020   Lab Results  Component Value Date   HDL 43 04/04/2020   Lab Results  Component Value Date   LDLCALC 111 (H) 04/04/2020   Lab Results  Component Value Date   TRIG 76 04/04/2020   Lab Results  Component Value Date   CHOLHDL 3.9 04/04/2020   Lab Results  Component Value Date   HGBA1C 5.7 (H) 07/08/2020       Assessment & Plan:  1. Other microscopic hematuria - POCT URINALYSIS DIP (CLINITEK) normal.  Recommended keep referral to urology.   2. Primary osteoarthritis of left knee - Ambulatory referral to Orthopedic Surgery Risks were discussed including bleeding, infection, increase in sugars if diabetic, atrophy at site of injection, and increased pain.  After consent was obtained, using sterile technique the left lateral inferior knee was prepped with betadine and alcohol.  Kenalog 80 mg and 5 ml plain Lidocaine was then injected and the needle withdrawn.  The procedure was well tolerated.   The patient is asked to continue to rest the joint for a few more days before resuming regular activities.  It may be more painful for the first 1-2 days.  Watch for fever, or increased swelling or persistent pain in the joint. Call or return to clinic prn if such symptoms occur or there is failure to improve as anticipated. - triamcinolone acetonide (KENALOG-40) injection 80 mg  3. History of recurrent UTIs  recommend keep referral to urology.  Meds ordered this encounter  Medications  . triamcinolone acetonide (KENALOG-40) injection 80 mg    Orders Placed This Encounter  Procedures  . Ambulatory referral to Orthopedic Surgery  . POCT URINALYSIS DIP (CLINITEK)    Follow-up: No follow-ups on file.  An After Visit Summary was printed and given to the patient.  [name], MD [facility] [phone]

## 2020-09-16 ENCOUNTER — Encounter: Payer: Self-pay | Admitting: Family Medicine

## 2020-09-26 ENCOUNTER — Other Ambulatory Visit: Payer: Self-pay | Admitting: Family Medicine

## 2020-10-02 ENCOUNTER — Other Ambulatory Visit: Payer: Self-pay

## 2020-10-02 ENCOUNTER — Ambulatory Visit: Payer: Self-pay

## 2020-10-02 ENCOUNTER — Ambulatory Visit: Payer: Medicare HMO | Admitting: Orthopaedic Surgery

## 2020-10-02 ENCOUNTER — Other Ambulatory Visit: Payer: Self-pay | Admitting: Family Medicine

## 2020-10-02 DIAGNOSIS — M1712 Unilateral primary osteoarthritis, left knee: Secondary | ICD-10-CM | POA: Diagnosis not present

## 2020-10-02 DIAGNOSIS — E782 Mixed hyperlipidemia: Secondary | ICD-10-CM

## 2020-10-02 DIAGNOSIS — E1169 Type 2 diabetes mellitus with other specified complication: Secondary | ICD-10-CM | POA: Diagnosis not present

## 2020-10-02 NOTE — Progress Notes (Signed)
Office Visit Note   Patient: Kerry Matthews           Date of Birth: [birth year]           MRN: [id number] Visit Date: 10/02/2020              Requested by: [name], MD [address],  [place] [address] PCP: [name], MD   Assessment & Plan: Visit Diagnoses:  1. Primary osteoarthritis of left knee     Plan: Impression is left knee posttraumatic osteoarthritis with large bony defect of the medial femoral condyle.  The patient has not had substantial relief from cortisone injection and is thus interested knee replacement surgery.  This was discussed today.  Risk, benefits and poss complications reviewed.  Rehab recovery time discussed.  All questions were answered.  He is a diabetic so we will need to obtain a new hemoglobin A1c.  We will also get cardiac and primary care clearance prior to surgical intervention.  Total face to face encounter time was greater than 25 minutes and over half of this time was spent in counseling and/or coordination of care.  Follow-Up Instructions: Return if symptoms worsen or fail to improve.   Orders:  Orders Placed This Encounter  Procedures  . HgB A1c  . Prealbumin   No orders of the defined types were placed in this encounter.     Procedures: No procedures performed   Clinical Data: No additional findings.   Subjective: Chief Complaint  Patient presents with  . Left Knee - Pain    HPI patient is a very pleasant 85 year old gentleman who comes in today with left knee pain.  He was seen in our office over a year ago with a left knee insufficiency fracture of the medial femoral condyle and tibial spine following a fall.  He was given a prescription for a medial unloader brace which caused blisters and did not really help his pain.  He has had continued pain since.  He was seen by his primary care provider and referred to an orthopedist in [place] for which she had repeat x-rays and cortisone injection a few weeks  ago.  He notes that the injection is slightly help.  The pain he has is worse with ambulating.  He does not take medication for this.  At this point, he is interested in knee replacement surgery.  Review of Systems as detailed in HPI.  All others reviewed and are negative.   Objective: Vital Signs: There were no vitals taken for this visit.  Physical Exam well-developed well-nourished gentleman in no acute distress.  Alert and oriented x3.  Ortho Exam right knee exam shows a trace effusion.  Range of motion 0 to 115 degrees.  Medial joint line tenderness and tenderness over the medial femoral condyle.  Mild patellofemoral crepitus.  Ligaments are stable.  He is neurovascular intact distally.  Specialty Comments:  No specialty comments available.  Imaging: No new imaging   PMFS History: Patient Active Problem List   Diagnosis Date Noted  . Hematuria 09/07/2020  . Proteinuria 09/07/2020  . Dysuria 09/07/2020  . Urinary urgency 09/07/2020  . Renal insufficiency 09/06/2020  . Coronary artery disease   . Diabetes ([place])   . GAD (generalized anxiety disorder)   . Hyperlipidemia   . Hypertension   . Osteoarthritis   . Other emphysema ([place])   . Primary insomnia   . Renal stones   . Skin cancer   .  UC (ulcerative colitis) ([place])   . UTI (urinary tract infection)   . Medicare annual wellness visit, subsequent 08/09/2020  . Severe sepsis ([place]) 07/07/2020  . Acute lower UTI 07/07/2020  . Leukocytosis 03/08/2020  . Acute low back pain without sciatica 03/08/2020  . Acute pain of left knee 11/30/2019  . Paresthesias in left hand 11/30/2019  . Alpha-1-antitrypsin deficiency carrier 05/04/2019  . Sepsis ([place]) 04/19/2019  . Myalgia 04/19/2019  . History of alpha-1-antitrypsin deficiency 04/12/2019  . Elevated rheumatoid factor 04/12/2019  . Interstitial pulmonary disease ([place]) 04/12/2019  . Abnormal ANCA test 04/12/2019  . Abnormal findings on diagnostic imaging of lung 04/12/2019   . Transaminitis   . Hypoxemia   . Polymyositis ([place])   . Low vitamin B12 level 04/03/2019  . Community acquired pneumonia   . Pneumonitis   . Cough   . Fever   . Abnormal CT of the chest   . Lower extremity edema   . AKI (acute kidney injury) ([place])   . Anemia   . Pneumonia 04/01/2019  . Elevated LFTs 04/01/2019  . Diabetes mellitus type 2 in obese ([place]) 04/01/2019  . Dyslipidemia associated with type 2 diabetes mellitus ([place]) 05/03/2018  . Essential hypertension 05/03/2018  . Mixed hyperlipidemia 05/03/2018  . Atherosclerotic heart disease of native coronary artery without angina pectoris 05/03/2018  . GERD (gastroesophageal reflux disease) 05/03/2018  . Trigger finger 05/03/2018  . Bradycardia 05/03/2018  . CKD (chronic kidney disease) 05/03/2018  . Insomnia 05/03/2018  . Emphysema (subcutaneous) (surgical) resulting from a procedure 05/03/2018   Past Medical History:  Diagnosis Date  . 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 Abnormal CT of the chest   . Abnormal findings on diagnostic imaging of lung 04/12/2019   04/01/2019-CT chest with contrast- no acute process in chest abdomen or pelvis, interstitial lung disease suspicious for early or mild UIP, pulmonary artery enlargement suggest PAH   . Acute low back pain without sciatica 03/08/2020  . Acute lower UTI 07/07/2020  . Acute pain of left knee 11/30/2019  . AKI (acute kidney injury) ([place])   . Alpha-1-antitrypsin deficiency carrier 05/04/2019  . Anemia   . Atherosclerotic heart disease of native coronary artery without angina pectoris 05/03/2018  . Bradycardia   . CKD (chronic kidney disease) 05/03/2018  . Community acquired pneumonia   . Coronary artery disease   . Cough   . Diabetes ([place])   . Diabetes mellitus type 2 in obese  ([place]) 04/01/2019  . Dyslipidemia associated with type 2 diabetes mellitus ([place]) 05/03/2018  . Elevated LFTs 04/01/2019  . Elevated rheumatoid factor 04/12/2019   04/03/2019-rheumatoid factor-95.4  . Emphysema (subcutaneous) (surgical) resulting from a procedure 05/03/2018  . Essential hypertension 05/03/2018  . Fever   . GAD (generalized anxiety disorder)   . GERD (gastroesophageal reflux disease)   . History of alpha-1-antitrypsin deficiency 04/12/2019  . Hyperlipidemia   . Hypertension   . Hypoxemia   . Insomnia 05/03/2018  . Interstitial pulmonary disease ([place]) 04/12/2019   04/01/2019-CT chest with contrast- no acute process in chest abdomen or pelvis, interstitial lung disease suspicious for early or mild UIP, pulmonary artery enlargement suggest PAH  04/02/2019-connective tissue work-up: Anti-Jo 1-negative Anti-DNA antibody double-stranded-negative Anti-scleroderma antibody-negative Sjogren's syndrome antibody-negative Sjogren's syndrome antibody-negative CK-31 CCP-6 E  . Leukocytosis 03/08/2020  .  Low vitamin B12 level 04/03/2019  . Lower extremity edema   . Medicare annual wellness visit, subsequent 08/09/2020  . Mixed hyperlipidemia 05/03/2018  . Myalgia 04/19/2019  . Osteoarthritis   . Other emphysema ([place])   . Paresthesias in left hand 11/30/2019  . Pneumonia 04/01/2019  . Pneumonitis   . Polymyositis ([place])   . Primary insomnia   . Renal stones   . Sepsis ([place]) 04/19/2019  . Severe sepsis ([place]) 07/07/2020  . Skin cancer   . Transaminitis   . Trigger finger 05/03/2018  . UC (ulcerative colitis) ([place])   . UTI (urinary tract infection)     Family History  Problem Relation Age of Onset  . Tuberculosis Mother   . Stroke Father   . Pancreatic cancer Sister   . Heart attack Sister   . Lung disease Sister   . Clotting disorder Brother   . Colon cancer Neg Hx   . Esophageal cancer Neg Hx     Past Surgical History:  Procedure Laterality Date  . ANGIOPLASTY    . BACK SURGERY    . CATARACT  EXTRACTION    . COLONOSCOPY  06/16/2005   Mild colitis involving splenic flexure. Colonic polyps, status post polypectomy. Mild pancolonic diverticulitits. Internal hemorrhoids.   . ESOPHAGOGASTRODUODENOSCOPY  04/26/2003   Irregular Z line suggestive of GERD. Mild gastritis status post CLO testing.   Marland Kitchen TRIGGER FINGER RELEASE     Social History   Occupational History  . Occupation: retired  Tobacco Use  . Smoking status: Never Smoker  . Smokeless tobacco: Never Used  Vaping Use  . Vaping Use: Never used  Substance and Sexual Activity  . Alcohol use: Never  . Drug use: Never  . Sexual activity: Not on file

## 2020-10-03 ENCOUNTER — Other Ambulatory Visit: Payer: Self-pay

## 2020-10-03 LAB — HEMOGLOBIN A1C
Hgb A1c MFr Bld: 6.2 % of total Hgb — ABNORMAL HIGH (ref <5.7)
Mean Plasma Glucose: 131 mg/dL
eAG (mmol/L): 7.3 mmol/L

## 2020-10-03 LAB — PREALBUMIN: Prealbumin: 25 mg/dL (ref 21–43)

## 2020-10-03 MED ORDER — LOSARTAN POTASSIUM 25 MG PO TABS: 25.0000 mg | ORAL_TABLET | Freq: Every day | ORAL | 1 refills | Status: DC

## 2020-10-03 MED ORDER — TRUE METRIX METER DEVI: 0 refills | Status: DC

## 2020-10-03 MED ORDER — TRUE METRIX BLOOD GLUCOSE TEST VI STRP: ORAL_STRIP | 3 refills | Status: DC

## 2020-10-03 NOTE — Progress Notes (Signed)
A1C good

## 2020-10-08 ENCOUNTER — Encounter: Payer: Self-pay | Admitting: Family Medicine

## 2020-10-08 ENCOUNTER — Other Ambulatory Visit: Payer: Self-pay

## 2020-10-08 ENCOUNTER — Ambulatory Visit (INDEPENDENT_AMBULATORY_CARE_PROVIDER_SITE_OTHER): Payer: Medicare HMO | Admitting: Family Medicine

## 2020-10-08 VITALS — Temp 97.2°F | Ht 67.0 in | Wt 190.0 lb

## 2020-10-08 DIAGNOSIS — E1142 Type 2 diabetes mellitus with diabetic polyneuropathy: Secondary | ICD-10-CM | POA: Diagnosis not present

## 2020-10-08 DIAGNOSIS — R42 Dizziness and giddiness: Secondary | ICD-10-CM

## 2020-10-08 DIAGNOSIS — R509 Fever, unspecified: Secondary | ICD-10-CM | POA: Diagnosis not present

## 2020-10-08 DIAGNOSIS — N10 Acute tubulo-interstitial nephritis: Secondary | ICD-10-CM

## 2020-10-08 DIAGNOSIS — N3001 Acute cystitis with hematuria: Secondary | ICD-10-CM | POA: Diagnosis not present

## 2020-10-08 LAB — POCT URINALYSIS DIPSTICK
Bilirubin, UA: NEGATIVE
Glucose, UA: NEGATIVE
Ketones, UA: NEGATIVE
Nitrite, UA: POSITIVE
Protein, UA: POSITIVE — AB
Spec Grav, UA: 1.02 (ref 1.010–1.025)
Urobilinogen, UA: 0.2 E.U./dL
pH, UA: 6 (ref 5.0–8.0)

## 2020-10-08 LAB — POC COVID19 BINAXNOW: SARS Coronavirus 2 Ag: NEGATIVE

## 2020-10-08 LAB — GLUCOSE, POCT (MANUAL RESULT ENTRY): POC Glucose: 151 mg/dl — AB (ref 70–99)

## 2020-10-08 MED ORDER — AMOXICILLIN-POT CLAVULANATE 875-125 MG PO TABS: 1.0000 | ORAL_TABLET | Freq: Two times a day (BID) | ORAL | 0 refills | Status: DC

## 2020-10-08 MED ORDER — CEFTRIAXONE SODIUM 1 G IJ SOLR
1.0000 g | Freq: Once | INTRAMUSCULAR | Status: AC
  Administered 2020-10-08: 1 g via INTRAMUSCULAR

## 2020-10-08 NOTE — Patient Instructions (Signed)
HOLD losartan Rocephin 1 gm shot given  Start augmentin 875 mg one twice a day x 1 week, based on urine culture from January.

## 2020-10-08 NOTE — Progress Notes (Signed)
Acute Office Visit  Subjective:    Patient ID: Kerry Matthews, male    DOB: Jan 31, 1936, 85 y.o.   MRN: [id number]  Chief Complaint  Patient presents with  . Urinary Tract Infection    HPI Patient is in today for UTi x 3-4 days, has appt with Urology this Thursday. T101.8 this morning. Began two days before. Increase urinary frequency. Feels a little dizzy.  Dysuria present. No back or abdominal pain. No nausea or vomiting.  Pt has history of urosepsis in the last 3 months as well as recurrent UTIs. Referral to urology done in January and scheduled to be seen by urology later this week.   Past Medical History:  Diagnosis Date  . 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 Abnormal CT of the chest   . Abnormal findings on diagnostic imaging of lung 04/12/2019   04/01/2019-CT chest with contrast- no acute process in chest abdomen or pelvis, interstitial lung disease suspicious for early or mild UIP, pulmonary artery enlargement suggest PAH   . Acute low back pain without sciatica 03/08/2020  . Acute lower UTI 07/07/2020  . Acute pain of left knee 11/30/2019  . AKI (acute kidney injury) ([place])   . Alpha-1-antitrypsin deficiency carrier 05/04/2019  . Anemia   . Atherosclerotic heart disease of native coronary artery without angina pectoris 05/03/2018  . Bradycardia   . CKD (chronic kidney disease) 05/03/2018  . Community acquired pneumonia   . Coronary artery disease   . Cough   . Diabetes ([place])   . Diabetes mellitus type 2 in obese ([place]) 04/01/2019  . Dyslipidemia associated with type 2 diabetes mellitus ([place]) 05/03/2018  . Elevated LFTs 04/01/2019  . Elevated rheumatoid factor 04/12/2019   04/03/2019-rheumatoid factor-95.4  . Emphysema (subcutaneous) (surgical) resulting from a procedure 05/03/2018  .  Essential hypertension 05/03/2018  . Fever   . GAD (generalized anxiety disorder)   . GERD (gastroesophageal reflux disease)   . History of alpha-1-antitrypsin deficiency 04/12/2019  . Hyperlipidemia   . Hypertension   . Hypoxemia   . Insomnia 05/03/2018  . Interstitial pulmonary disease ([place]) 04/12/2019   04/01/2019-CT chest with contrast- no acute process in chest abdomen or pelvis, interstitial lung disease suspicious for early or mild UIP, pulmonary artery enlargement suggest PAH  04/02/2019-connective tissue work-up: Anti-Jo 1-negative Anti-DNA antibody double-stranded-negative Anti-scleroderma antibody-negative Sjogren's syndrome antibody-negative Sjogren's syndrome antibody-negative CK-31 CCP-6 E  . Leukocytosis 03/08/2020  . Low vitamin B12 level 04/03/2019  . Lower extremity edema   . Medicare annual wellness visit, subsequent 08/09/2020  . Mixed hyperlipidemia 05/03/2018  . Myalgia 04/19/2019  . Osteoarthritis   . Other emphysema ([place])   . Paresthesias in left hand 11/30/2019  . Pneumonia 04/01/2019  . Pneumonitis   . Polymyositis ([place])   . Primary insomnia   . Renal stones   . Sepsis ([place]) 04/19/2019  . Severe sepsis ([place]) 07/07/2020  . Skin cancer   . Transaminitis   . Trigger finger 05/03/2018  . UC (ulcerative colitis) ([place])   . UTI (urinary tract infection)     Past Surgical History:  Procedure Laterality Date  . ANGIOPLASTY    . BACK SURGERY    . CATARACT EXTRACTION    . COLONOSCOPY  06/16/2005   Mild colitis involving splenic flexure. Colonic polyps, status post polypectomy. Mild  pancolonic diverticulitits. Internal hemorrhoids.   . ESOPHAGOGASTRODUODENOSCOPY  04/26/2003   Irregular Z line suggestive of GERD. Mild gastritis status post CLO testing.   Marland Kitchen TRIGGER FINGER RELEASE      Family History  Problem Relation Age of Onset  . Tuberculosis Mother   . Stroke Father   . Pancreatic cancer Sister   . Heart attack Sister   . Lung disease Sister   . Clotting disorder Brother    . Colon cancer Neg Hx   . Esophageal cancer Neg Hx     Social History   Socioeconomic History  . Marital status: Married    Spouse name: Not on file  . Number of children: 2  . Years of education: Not on file  . Highest education level: Not on file  Occupational History  . Occupation: retired  Tobacco Use  . Smoking status: Never Smoker  . Smokeless tobacco: Never Used  Vaping Use  . Vaping Use: Never used  Substance and Sexual Activity  . Alcohol use: Never  . Drug use: Never  . Sexual activity: Not on file  Other Topics Concern  . Not on file  Social History Narrative  . Not on file   Social Determinants of Health   Financial Resource Strain: Not on file  Food Insecurity: No Food Insecurity  . Worried About Charity fundraiser in the Last Year: Never true  . Ran Out of Food in the Last Year: Never true  Transportation Needs: No Transportation Needs  . Lack of Transportation (Medical): No  . Lack of Transportation (Non-Medical): No  Physical Activity: Sufficiently Active  . Days of Exercise per Week: 5 days  . Minutes of Exercise per Session: 30 min  Stress: Not on file  Social Connections: Not on file  Intimate Partner Violence: Not on file    Outpatient Medications Prior to Visit  Medication Sig Dispense Refill  . acetaminophen (TYLENOL) 500 MG tablet Take 500-1,000 mg by mouth every 6 (six) hours as needed for fever or headache (pain).    Marland Kitchen aspirin 81 MG EC tablet Take 81 mg by mouth daily before breakfast.     . atorvastatin (LIPITOR) 20 MG tablet TAKE 1 TABLET (20 MG TOTAL) BY MOUTH AT BEDTIME. 90 tablet 1  . Blood Glucose Monitoring Suppl (TRUE METRIX METER) DEVI E11.69 Use as directed 1 each 0  . Coenzyme Q10 (CO Q 10) 100 MG CAPS Take 100 mg by mouth at bedtime.     . furosemide (LASIX) 20 MG tablet Take 20 mg by mouth as needed.    . gabapentin (NEURONTIN) 400 MG capsule TAKE 1 CAPSULE THREE TIMES DAILY 270 capsule 0  . glipiZIDE (GLUCOTROL) 5 MG  tablet take 1 1/2 - 2 tablet by oral route 2 times every day before meals    . glucose blood (TRUE METRIX BLOOD GLUCOSE TEST) test strip E11.69 TEST BLOOD SUGAR THREE TIMES DAILY BEFORE MEALS 300 strip 3  . losartan (COZAAR) 25 MG tablet Take 1 tablet (25 mg total) by mouth daily. 90 tablet 1  . Multiple Vitamins-Minerals (PRESERVISION AREDS 2) CAPS Take 1 capsule by mouth 2 (two) times daily after a meal.    . nitroGLYCERIN (NITROSTAT) 0.4 MG SL tablet Place 1 tablet (0.4 mg total) under the tongue every 5 (five) minutes as needed for chest pain. 25 tablet 6  . Omega-3 Fatty Acids (FISH OIL) 1000 MG CAPS Take 1,000 mg by mouth 2 (two) times daily.     Marland Kitchen  omeprazole (PRILOSEC) 20 MG capsule TAKE 1 CAPSULE EVERY DAY 90 capsule 1  . PRESCRIPTION MEDICATION every 6 (six) months. Infusion related to auto-immune issues - Dr Lenna Gilford, [place].    . riTUXimab (RITUXAN) 100 MG/10ML injection Inject into the vein.    . vitamin B-12 (CYANOCOBALAMIN) 1000 MCG tablet Take 1 tablet (1,000 mcg total) by mouth daily. (Patient taking differently: Take 1,000 mcg by mouth daily before breakfast.) 30 tablet 0  . amoxicillin (AMOXIL) 500 MG capsule Take 1 capsule (500 mg total) by mouth 2 (two) times daily. 14 capsule 0   No facility-administered medications prior to visit.    Allergies  Allergen Reactions  . Ace Inhibitors Other (See Comments)    Slow heart rate  . Hydrocodone Nausea And Vomiting  . Hydrocodone-Acetaminophen Nausea And Vomiting  . Sulfamethoxazole-Trimethoprim Nausea And Vomiting    Review of Systems  Constitutional: Positive for fever (101.8). Negative for chills.  HENT: Negative for congestion, ear pain and sore throat.   Respiratory: Negative for cough and shortness of breath.   Cardiovascular: Negative for chest pain.  Gastrointestinal: Negative for abdominal pain, constipation, diarrhea, nausea and vomiting.  Genitourinary: Positive for difficulty urinating ([place]), frequency  and urgency.  Musculoskeletal: Negative for back pain.       Objective:    Physical Exam Vitals reviewed.  Constitutional:      Appearance: Normal appearance.  Cardiovascular:     Rate and Rhythm: Normal rate and regular rhythm.     Pulses: Normal pulses.     Heart sounds: Normal heart sounds.  Pulmonary:     Effort: Pulmonary effort is normal.     Breath sounds: Normal breath sounds. No wheezing, rhonchi or rales.  Abdominal:     General: Bowel sounds are normal.     Palpations: Abdomen is soft.     Tenderness: There is no abdominal tenderness. There is no right CVA tenderness or left CVA tenderness.  Neurological:     Mental Status: He is alert.  Psychiatric:        Mood and Affect: Mood normal.        Behavior: Behavior normal.     Temp (!) 97.2 F (36.2 C)   Ht 5\' 7"  (1.702 m)   Wt 190 lb (86.2 kg)   SpO2 99%   BMI 29.76 kg/m  Wt Readings from Last 3 Encounters:  10/08/20 190 lb (86.2 kg)  09/13/20 192 lb (87.1 kg)  09/06/20 188 lb (85.3 kg)    Health Maintenance Due  Topic Date Due  . FOOT EXAM  Never done    There are no preventive care reminders to display for this patient.   Lab Results  Component Value Date   TSH 2.690 12/01/2019   Lab Results  Component Value Date   WBC 7.1 07/17/2020   HGB 12.4 (L) 07/17/2020   HCT 36.8 (L) 07/17/2020   MCV 87 07/17/2020   PLT 498 (H) 07/17/2020   Lab Results  Component Value Date   NA 140 07/26/2020   K 4.8 07/26/2020   CO2 21 07/26/2020   GLUCOSE 114 (H) 07/26/2020   BUN 20 07/26/2020   CREATININE 1.37 (H) 07/26/2020   BILITOT 0.6 07/26/2020   ALKPHOS 137 (H) 07/26/2020   AST 26 07/26/2020   ALT 23 07/26/2020   PROT 6.2 07/26/2020   ALBUMIN 4.1 07/26/2020   CALCIUM 8.8 07/26/2020   ANIONGAP 7 07/09/2020   Lab Results  Component Value Date   CHOL 168  04/04/2020   Lab Results  Component Value Date   HDL 43 04/04/2020   Lab Results  Component Value Date   LDLCALC 111 (H) 04/04/2020    Lab Results  Component Value Date   TRIG 76 04/04/2020   Lab Results  Component Value Date   CHOLHDL 3.9 04/04/2020   Lab Results  Component Value Date   HGBA1C 6.2 (H) 10/02/2020       Assessment & Plan:  1. Acute pyelonephritis Start on augmentin - POCT urinalysis dipstick - Urine Culture - cefTRIAXone (ROCEPHIN) injection 1 g - amoxicillin-clavulanate (AUGMENTIN) 875-125 MG tablet; Take 1 tablet by mouth 2 (two) times daily.  Dispense: 14 tablet; Refill: 0  2. Dizziness HOLD losartan. - Glucose (CBG) 151.  3. Diabetic polyneuropathy associated with type 2 diabetes mellitus (HCC)  - Glucose (CBG) 151.  4. Fever Covid 19 POCT negative.   Meds ordered this encounter  Medications  . cefTRIAXone (ROCEPHIN) injection 1 g  . amoxicillin-clavulanate (AUGMENTIN) 875-125 MG tablet    Sig: Take 1 tablet by mouth 2 (two) times daily.    Dispense:  14 tablet    Refill:  0    Orders Placed This Encounter  Procedures  . Urine Culture  . POCT urinalysis dipstick  . Glucose (CBG)  . POC COVID-19 BinaxNow    Follow-up: Return if symptoms worsen or fail to improve.  An After Visit Summary was printed and given to the patient.  [name], MD [facility] [phone]

## 2020-10-11 ENCOUNTER — Telehealth: Payer: Self-pay

## 2020-10-11 DIAGNOSIS — R3915 Urgency of urination: Secondary | ICD-10-CM | POA: Diagnosis not present

## 2020-10-11 DIAGNOSIS — Z125 Encounter for screening for malignant neoplasm of prostate: Secondary | ICD-10-CM | POA: Diagnosis not present

## 2020-10-11 DIAGNOSIS — R310 Gross hematuria: Secondary | ICD-10-CM | POA: Diagnosis not present

## 2020-10-11 LAB — URINE CULTURE

## 2020-10-11 NOTE — Telephone Encounter (Signed)
   Primary Cardiologist: Dr. Lennox Pippins, MD   Chart reviewed as part of pre-operative protocol coverage. Given past medical history and time since last visit, based on ACC/AHA guidelines, Kerry Matthews would be at acceptable risk for the planned procedure without further cardiovascular testing.   The patient was advised that if he develops new symptoms prior to surgery to contact our office to arrange for a follow-up visit, and he verbalized understanding.  I will route this recommendation to the requesting party via Epic fax function and remove from pre-op pool.  Please call with questions.  Kathyrn Drown, NP 10/11/2020, 4:42 PM

## 2020-10-11 NOTE — Telephone Encounter (Signed)
   [facility] Pre-operative Risk Assessment    HEARTCARE STAFF: - Please ensure there is not already an duplicate clearance open for this procedure. - Under Visit Info/Reason for Call, type in Other and utilize the format Clearance MM/DD/YY or Clearance TBD. Do not use dashes or single digits. - If request is for dental extraction, please clarify the # of teeth to be extracted.  Request for surgical clearance:  1. What type of surgery is being performed? Left total knee arthroplasty   2. When is this surgery scheduled? TBD   3. What type of clearance is required (medical clearance vs. Pharmacy clearance to hold med vs. Both)? Medical  4. Are there any medications that need to be held prior to surgery and how long?   5. Practice name and name of physician performing surgery? [facility], [place]   6. What is the office phone number? [phone]   7.   What is the office fax number? [phone] Attention Kerry Matthews  8.   Anesthesia type (None, local, MAC, general) ? Spinal Block   Kerry Matthews 10/11/2020, 2:08 PM  _________________________________________________________________   (provider comments below)

## 2020-10-31 DIAGNOSIS — K439 Ventral hernia without obstruction or gangrene: Secondary | ICD-10-CM | POA: Diagnosis not present

## 2020-10-31 DIAGNOSIS — K4090 Unilateral inguinal hernia, without obstruction or gangrene, not specified as recurrent: Secondary | ICD-10-CM | POA: Diagnosis not present

## 2020-10-31 DIAGNOSIS — M5136 Other intervertebral disc degeneration, lumbar region: Secondary | ICD-10-CM | POA: Diagnosis not present

## 2020-10-31 DIAGNOSIS — R310 Gross hematuria: Secondary | ICD-10-CM | POA: Diagnosis not present

## 2020-11-07 ENCOUNTER — Other Ambulatory Visit: Payer: Self-pay | Admitting: Physician Assistant

## 2020-11-07 MED ORDER — CEPHALEXIN 500 MG PO CAPS: 500.0000 mg | ORAL_CAPSULE | Freq: Four times a day (QID) | ORAL | 0 refills | Status: DC

## 2020-11-07 MED ORDER — ONDANSETRON HCL 4 MG PO TABS: 4.0000 mg | ORAL_TABLET | Freq: Three times a day (TID) | ORAL | 0 refills | Status: DC | PRN

## 2020-11-07 MED ORDER — DOCUSATE SODIUM 100 MG PO CAPS: 100.0000 mg | ORAL_CAPSULE | Freq: Every day | ORAL | 2 refills | Status: DC | PRN

## 2020-11-07 MED ORDER — ASPIRIN EC 81 MG PO TBEC: 81.0000 mg | DELAYED_RELEASE_TABLET | Freq: Two times a day (BID) | ORAL | 0 refills | Status: DC

## 2020-11-07 MED ORDER — OXYCODONE-ACETAMINOPHEN 5-325 MG PO TABS: 1.0000 | ORAL_TABLET | Freq: Four times a day (QID) | ORAL | 0 refills | Status: DC | PRN

## 2020-11-07 MED ORDER — METHOCARBAMOL 500 MG PO TABS: 500.0000 mg | ORAL_TABLET | Freq: Two times a day (BID) | ORAL | 0 refills | Status: DC | PRN

## 2020-11-07 NOTE — Progress Notes (Addendum)
Your procedure is scheduled on Monday November 12, 2020.  Report to [facility] Main Entrance "A" at 10:30 A.M., and check in at the Admitting office.  Call this number if you have problems the morning of surgery: [phone]  Call [phone] if you have any questions prior to your surgery date Monday-Friday 8am-4pm   Remember: Do not eat after midnight the night before your surgery  You may drink clear liquids until 09:30 A.M. the morning of your surgery.   Clear liquids allowed are: Water, Non-Citrus Juices (without pulp), Carbonated Beverages, Clear Tea, Black Coffee Only, and Gatorade  Please complete your PRE-SURGERY 10 OZ WATER that was provided to you by09:30 A.M. the morning of your surgery.  Please, if able, drink it in one setting. DO NOT SIP.    Take these medicines the morning of surgery with A SIP OF WATER: gabapentin (NEURONTIN) omeprazole (PRILOSEC)   If needed: acetaminophen (TYLENOL) nitroGLYCERIN (NITROSTAT)   Follow your surgeon's instructions on when to stop Aspirin.  If no instructions were given by your surgeon then you will need to call the office to get those instructions.    As of today, STOP taking any Aleve, Naproxen, Ibuprofen, Motrin, Advil, Goody's, BC's, all herbal medications, fish oil, and all vitamins.  ** PLEASE check your blood sugar the morning of your surgery when you wake up and every 2 hours until you get to the Short Stay unit.  If your blood sugar is less than 70 mg/dL, you will need to treat for low blood sugar: - Do not take insulin. - Treat a low blood sugar (less than 70 mg/dL) with  cup of clear juice (cranberry or apple), 4 glucose tablets, OR glucose gel. - Recheck blood sugar in 15 minutes after treatment (to make sure it is greater than 70 mg/dL). If your blood sugar is not greater than 70 mg/dL on recheck, call [phone] for further instructions.  *PLEASE DO NOT TAKE ANY DIABETIC MEDICATIONS MORNING OF SURGERY*    The  Morning of Surgery  Do not wear jewelry  Do not wear lotions, powders, colognes, or deodorant Men may shave face and neck.  Do not bring valuables to the hospital.  [facility] is not responsible for any belongings or valuables.  If you are a smoker, DO NOT Smoke 24 hours prior to surgery  If you wear a CPAP at night please bring your mask the morning of surgery   Remember that you must have someone to transport you home after your surgery, and remain with you for 24 hours if you are discharged the same day.   Please bring cases for contacts, glasses, hearing aids, dentures or bridgework because it cannot be worn into surgery.    Leave your suitcase in the car.  After surgery it may be brought to your room.  For patients admitted to the hospital, discharge time will be determined by your treatment team.  Patients discharged the day of surgery will not be allowed to drive home.    Special instructions:   [place]- Preparing For Surgery  Before surgery, you can play an important role. Because skin is not sterile, your skin needs to be as free of germs as possible. You can reduce the number of germs on your skin by washing with CHG (chlorahexidine gluconate) Soap before surgery.  CHG is an antiseptic cleaner which kills germs and bonds with the skin to continue killing germs even after washing.    Oral Hygiene is also  important to reduce your risk of infection.  Remember - BRUSH YOUR TEETH THE MORNING OF SURGERY WITH YOUR REGULAR TOOTHPASTE  Please do not use if you have an allergy to CHG or antibacterial soaps. If your skin becomes reddened/irritated stop using the CHG.  Do not shave (including legs and underarms) for at least 48 hours prior to first CHG shower. It is OK to shave your face.  Please follow these instructions carefully.   1. Shower the NIGHT BEFORE SURGERY and the MORNING OF SURGERY with CHG Soap.   2. If you chose to wash your hair and body, wash as usual with  your normal shampoo and body-wash/soap.  3. Rinse your hair and body thoroughly to remove the shampoo and soap.  4. Apply CHG directly to the skin (ONLY FROM THE NECK DOWN) and wash gently with a scrungie or a clean washcloth.   5. Do not use on open wounds or open sores. Avoid contact with your eyes, ears, mouth and genitals (private parts). Wash Face and genitals (private parts)  with your normal soap.   6. Wash thoroughly, paying special attention to the area where your surgery will be performed.  7. Thoroughly rinse your body with warm water from the neck down.  8. DO NOT shower/wash with your normal soap after using and rinsing off the CHG Soap.  9. Pat yourself dry with a CLEAN TOWEL.  10. Wear CLEAN PAJAMAS to bed the night before surgery  11. Place CLEAN SHEETS on your bed the night of your first shower and DO NOT SLEEP WITH PETS.  12. Wear comfortable clothes the morning of surgery.     Day of Surgery:  Please shower the morning of surgery with the CHG soap Do not apply any deodorants/lotions. Please wear clean clothes to the hospital/surgery center.   Remember to brush your teeth WITH YOUR REGULAR TOOTHPASTE.   Please read over the following fact sheets that you were given.

## 2020-11-08 ENCOUNTER — Encounter (HOSPITAL_COMMUNITY)
Admission: RE | Admit: 2020-11-08 | Discharge: 2020-11-08 | Disposition: A | Discharge status: Home / Self Care | Payer: Medicare HMO | Source: Ambulatory Visit | Attending: Orthopaedic Surgery | Admitting: Orthopaedic Surgery

## 2020-11-08 ENCOUNTER — Encounter (HOSPITAL_COMMUNITY): Admission: RE | Admit: 2020-11-08 | Payer: Medicare HMO | Source: Ambulatory Visit

## 2020-11-08 ENCOUNTER — Other Ambulatory Visit: Payer: Self-pay

## 2020-11-08 ENCOUNTER — Ambulatory Visit (HOSPITAL_COMMUNITY)
Admission: RE | Admit: 2020-11-08 | Discharge: 2020-11-08 | Disposition: A | Discharge status: Home / Self Care | Payer: Medicare HMO | Source: Ambulatory Visit | Attending: Physician Assistant | Admitting: Physician Assistant

## 2020-11-08 ENCOUNTER — Encounter (HOSPITAL_COMMUNITY): Payer: Self-pay

## 2020-11-08 DIAGNOSIS — Z20822 Contact with and (suspected) exposure to covid-19: Secondary | ICD-10-CM | POA: Insufficient documentation

## 2020-11-08 DIAGNOSIS — Z9889 Other specified postprocedural states: Secondary | ICD-10-CM | POA: Diagnosis not present

## 2020-11-08 DIAGNOSIS — J849 Interstitial pulmonary disease, unspecified: Secondary | ICD-10-CM | POA: Diagnosis not present

## 2020-11-08 DIAGNOSIS — M47814 Spondylosis without myelopathy or radiculopathy, thoracic region: Secondary | ICD-10-CM | POA: Diagnosis not present

## 2020-11-08 DIAGNOSIS — M1712 Unilateral primary osteoarthritis, left knee: Secondary | ICD-10-CM

## 2020-11-08 DIAGNOSIS — Z01812 Encounter for preprocedural laboratory examination: Secondary | ICD-10-CM | POA: Insufficient documentation

## 2020-11-08 LAB — URINALYSIS, ROUTINE W REFLEX MICROSCOPIC
Bilirubin Urine: NEGATIVE
Glucose, UA: NEGATIVE mg/dL
Hgb urine dipstick: NEGATIVE
Ketones, ur: NEGATIVE mg/dL
Nitrite: NEGATIVE
Protein, ur: NEGATIVE mg/dL
Specific Gravity, Urine: 1.009 (ref 1.005–1.030)
pH: 5 (ref 5.0–8.0)

## 2020-11-08 LAB — CBC WITH DIFFERENTIAL/PLATELET
Abs Immature Granulocytes: 0.05 10*3/uL (ref 0.00–0.07)
Basophils Absolute: 0.1 10*3/uL (ref 0.0–0.1)
Basophils Relative: 1 %
Eosinophils Absolute: 0.5 10*3/uL (ref 0.0–0.5)
Eosinophils Relative: 5 %
HCT: 43 % (ref 39.0–52.0)
Hemoglobin: 14.5 g/dL (ref 13.0–17.0)
Immature Granulocytes: 1 %
Lymphocytes Relative: 20 %
Lymphs Abs: 1.9 10*3/uL (ref 0.7–4.0)
MCH: 29.3 pg (ref 26.0–34.0)
MCHC: 33.7 g/dL (ref 30.0–36.0)
MCV: 86.9 fL (ref 80.0–100.0)
Monocytes Absolute: 0.9 10*3/uL (ref 0.1–1.0)
Monocytes Relative: 10 %
Neutro Abs: 6 10*3/uL (ref 1.7–7.7)
Neutrophils Relative %: 63 %
Platelets: 252 10*3/uL (ref 150–400)
RBC: 4.95 MIL/uL (ref 4.22–5.81)
RDW: 13.6 % (ref 11.5–15.5)
WBC: 9.4 10*3/uL (ref 4.0–10.5)
nRBC: 0 % (ref 0.0–0.2)

## 2020-11-08 LAB — COMPREHENSIVE METABOLIC PANEL
ALT: 118 U/L — ABNORMAL HIGH (ref 0–44)
AST: 77 U/L — ABNORMAL HIGH (ref 15–41)
Albumin: 3.8 g/dL (ref 3.5–5.0)
Alkaline Phosphatase: 280 U/L — ABNORMAL HIGH (ref 38–126)
Anion gap: 8 (ref 5–15)
BUN: 19 mg/dL (ref 8–23)
CO2: 25 mmol/L (ref 22–32)
Calcium: 8.6 mg/dL — ABNORMAL LOW (ref 8.9–10.3)
Chloride: 102 mmol/L (ref 98–111)
Creatinine, Ser: 1.19 mg/dL (ref 0.61–1.24)
GFR, Estimated: >60 mL/min (ref >60)
Glucose, Bld: 220 mg/dL — ABNORMAL HIGH (ref 70–99)
Potassium: 3.7 mmol/L (ref 3.5–5.1)
Sodium: 135 mmol/L (ref 135–145)
Total Bilirubin: 1.1 mg/dL (ref 0.3–1.2)
Total Protein: 6.7 g/dL (ref 6.5–8.1)

## 2020-11-08 LAB — TYPE AND SCREEN
ABO/RH(D): A POS
Antibody Screen: NEGATIVE

## 2020-11-08 LAB — PROTIME-INR
INR: 1.1 (ref 0.8–1.2)
Prothrombin Time: 13.3 seconds (ref 11.4–15.2)

## 2020-11-08 LAB — SURGICAL PCR SCREEN
MRSA, PCR: NEGATIVE
Staphylococcus aureus: NEGATIVE

## 2020-11-08 LAB — APTT: aPTT: 29 seconds (ref 24–36)

## 2020-11-08 LAB — GLUCOSE, CAPILLARY: Glucose-Capillary: 211 mg/dL — ABNORMAL HIGH (ref 70–99)

## 2020-11-08 LAB — SARS CORONAVIRUS 2 (TAT 6-24 HRS): SARS Coronavirus 2: NEGATIVE

## 2020-11-08 NOTE — Progress Notes (Signed)
PCP - Dr. [name] Cardiologist - Dr. Lennox Pippins   Chest x-ray - 11/08/20 EKG - 09/06/20 Stress Test - denies ECHO - 04/04/19 Cardiac Cath - denies  Sleep Study - denies CPAP - denies   Fasting Blood Sugar - 97-116 Checks Blood Sugar every 2-3 days.  Blood Thinner Instructions: n/a Aspirin Instructions:Pt states that he was told to cont aspirin. Advised pt to call office to verify.  ERAS Protcol - Pt to stop clear liquids by 0930 DOS. PRE-SURGERY Ensure or G2- pt given (1) 10oz bottle of water.  COVID TEST- 11/08/20 done in PAT.   Anesthesia review: yes, pt received cardiac clearance on 10/11/20  Patient denies shortness of breath, fever, cough and chest pain at PAT appointment   All instructions explained to the patient, with a verbal understanding of the material. Patient agrees to go over the instructions while at home for a better understanding. Patient also instructed to self quarantine after being tested for COVID-19. The opportunity to ask questions was provided.

## 2020-11-09 ENCOUNTER — Encounter (HOSPITAL_COMMUNITY): Payer: Self-pay | Admitting: Physician Assistant

## 2020-11-09 ENCOUNTER — Telehealth: Payer: Self-pay | Admitting: *Deleted

## 2020-11-09 MED ORDER — TRANEXAMIC ACID 1000 MG/10ML IV SOLN
2000.0000 mg | INTRAVENOUS | Status: AC
  Filled 2020-11-09 (×2): qty 20

## 2020-11-09 NOTE — Progress Notes (Signed)
Sherri from [facility] called to ask about time change. She will call pt about time change - pt to arrive at 0800, clears until 0700 morning of surgery.

## 2020-11-09 NOTE — Progress Notes (Signed)
Anesthesia Chart Review:  Follows with cardiology for history of CAD s/p stent.  Most recent echo August 2020 showed EF 60 to 65%, normal wall motion, mild MR, mild AR.  Last seen by Dr. Geraldo Pitter 09/06/2020, stable at that time, no changes made to management.  Preop clearance per telephone encounter 10/11/2020, "Chart reviewed as part of pre-operative protocol coverage. Given past medical history and time since last visit, based on ACC/AHA guidelines, Kerry Matthews would be at acceptable risk for the planned procedure without further cardiovascular testing."  Diet controlled DM2, last A1c 6.2 on 10/02/2020.  Preop labs reviewed, AST and ALT mildly elevated, review of history shows patient has mild intermittent transaminitis with similar values in the past.  Alkaline phosphatase elevated at 280, patient appears to have chronic mildly elevated alk phos.  Remainder of labs unremarkable.  EKG 09/06/2020: NSR.  Rate 63.  LAD.  CT chest 10/14/2019: IMPRESSION: 1. The appearance the chest is compatible with interstitial lung disease, with a spectrum of findings considered diagnostic of usual interstitial pneumonia (UIP), as detailed above. 2. No typical imaging stigmata of alpha-1 antitrypsin deficiency noted on today's examination. 3. Aortic atherosclerosis, in addition to left main and 3 vessel coronary artery disease. Please note that although the presence of coronary artery calcium documents the presence of coronary artery disease, the severity of this disease and any potential stenosis cannot be assessed on this non-gated CT examination. Assessment for potential risk factor modification, dietary therapy or pharmacologic therapy may be warranted, if clinically indicated. 4. There are calcifications of the aortic valve. Echocardiographic correlation for evaluation of potential valvular dysfunction may be warranted if clinically indicated.  TTE 04/12/2019: 1. The left ventricle has normal systolic  function with an ejection  fraction of 60-65%. The cavity size was normal. There is mildly increased  left ventricular wall thickness. Left ventricular diastolic Doppler  parameters are indeterminate. No evidence  of left ventricular regional wall motion abnormalities.  2. The right ventricle has normal systolic function. The cavity was  normal. There is no increase in right ventricular wall thickness.  3. Left atrial size was mildly dilated.  4. No evidence of mitral valve stenosis. Mild mitral regurgitation.  5. The aortic valve is tricuspid. Aortic valve regurgitation is mild by  color flow Doppler. No stenosis of the aortic valve.  6. The aorta is normal in size and structure.  7. The IVC was normal in size. No complete TR doppler jet so unable to  estimate PA systolic pressure.   PFT 05/03/2019: FVC-%Pred-Pre Latest Units: % 92  FEV1-%Pred-Pre Latest Units: % 104  FEV1FVC-%Pred-Pre Latest Units: % 111  TLC % pred Latest Units: % 75  RV % pred Latest Units: % 42  DLCO unc % pred Latest Units: % [address]    Kerry Matthews [facility] Short Stay Center/Anesthesiology Phone [phone] 11/09/2020 9:28 AM

## 2020-11-09 NOTE — Anesthesia Preprocedure Evaluation (Deleted)
Anesthesia Evaluation    Airway        Dental   Pulmonary           Cardiovascular hypertension,      Neuro/Psych    GI/Hepatic   Endo/Other  diabetes  Renal/GU      Musculoskeletal   Abdominal   Peds  Hematology   Anesthesia Other Findings   Reproductive/Obstetrics                             Anesthesia Physical Anesthesia Plan  ASA:   Anesthesia Plan:    Post-op Pain Management:    Induction:   PONV Risk Score and Plan:   Airway Management Planned:   Additional Equipment:   Intra-op Plan:   Post-operative Plan:   Informed Consent:   Plan Discussed with:   Anesthesia Plan Comments: (PAT note by Karoline Caldwell, PA-C: Follows with cardiology for history of CAD s/p stent.  Most recent echo August 2020 showed EF 60 to 65%, normal wall motion, mild MR, mild AR.  Last seen by Dr. Geraldo Pitter 09/06/2020, stable at that time, no changes made to management.  Preop clearance per telephone encounter 10/11/2020, "Chart reviewed as part of pre-operative protocol coverage. Given past medical history and time since last visit, based on ACC/AHA guidelines, Jerome Viglione would be at acceptable risk for the planned procedure without further cardiovascular testing."  Diet controlled DM2, last A1c 6.2 on 10/02/2020.  Preop labs reviewed, AST and ALT mildly elevated, review of history shows patient has mild intermittent transaminitis with similar values in the past.  Alkaline phosphatase elevated at 280, patient appears to have chronic mildly elevated alk phos.  Remainder of labs unremarkable.  EKG 09/06/2020: NSR.  Rate 63.  LAD.  CT chest 10/14/2019: IMPRESSION: 1. The appearance the chest is compatible with interstitial lung disease, with a spectrum of findings considered diagnostic of usual interstitial pneumonia (UIP), as detailed above. 2. No typical imaging stigmata of alpha-1 antitrypsin  deficiency noted on today's examination. 3. Aortic atherosclerosis, in addition to left main and 3 vessel coronary artery disease. Please note that although the presence of coronary artery calcium documents the presence of coronary artery disease, the severity of this disease and any potential stenosis cannot be assessed on this non-gated CT examination. Assessment for potential risk factor modification, dietary therapy or pharmacologic therapy may be warranted, if clinically indicated. 4. There are calcifications of the aortic valve. Echocardiographic correlation for evaluation of potential valvular dysfunction may be warranted if clinically indicated.  TTE 04/12/2019: 1. The left ventricle has normal systolic function with an ejection  fraction of 60-65%. The cavity size was normal. There is mildly increased  left ventricular wall thickness. Left ventricular diastolic Doppler  parameters are indeterminate. No evidence  of left ventricular regional wall motion abnormalities.  2. The right ventricle has normal systolic function. The cavity was  normal. There is no increase in right ventricular wall thickness.  3. Left atrial size was mildly dilated.  4. No evidence of mitral valve stenosis. Mild mitral regurgitation.  5. The aortic valve is tricuspid. Aortic valve regurgitation is mild by  color flow Doppler. No stenosis of the aortic valve.  6. The aorta is normal in size and structure.  7. The IVC was normal in size. No complete TR doppler jet so unable to  estimate PA systolic pressure.   PFT 05/03/2019: FVC-%Pred-Pre Latest Units: % 92 FEV1-%Pred-Pre Latest Units: % 104  FEV1FVC-%Pred-Pre Latest Units: % 111 TLC % pred Latest Units: % 75 RV % pred Latest Units: % 42 DLCO unc % pred Latest Units: % 85   )        Anesthesia Quick Evaluation

## 2020-11-09 NOTE — Telephone Encounter (Signed)
Ortho bundle pre-op call completed. 

## 2020-11-09 NOTE — Care Plan (Signed)
RNCM call to patient on 11/08/20 (late entry) and called again today to discuss his upcoming Left total knee replacement with Dr. Erlinda Hong on Monday, 11/12/20. Patient is an Ortho bundle patient through [facility] and is agreeable to case management. He has a wife that will be assisting at home after discharge. He has a BSC/3in1 and states he can get a FWW through his church for use. Will follow up on this Monday after surgery. Anticipate HHPT after short hospital stay. Choice provided and referral made to [facility] ([facility] Reviewed all post op care instructions. Will continue to follow for needs. Reviewed time to be at hospital and confirmed with pre-admission testing, Flavia Shipper, due to surgery time changing.

## 2020-11-12 ENCOUNTER — Encounter (HOSPITAL_COMMUNITY): Admission: RE | Payer: Self-pay | Source: Home / Self Care

## 2020-11-12 ENCOUNTER — Ambulatory Visit (HOSPITAL_COMMUNITY): Admission: RE | Admit: 2020-11-12 | Payer: Medicare HMO | Source: Home / Self Care | Admitting: Orthopaedic Surgery

## 2020-11-12 ENCOUNTER — Other Ambulatory Visit: Payer: Self-pay | Admitting: Physician Assistant

## 2020-11-12 SURGERY — ARTHROPLASTY, KNEE, TOTAL
Anesthesia: Spinal | Site: Knee | Laterality: Left

## 2020-11-12 MED ORDER — CIPROFLOXACIN HCL 500 MG PO TABS: 500.0000 mg | ORAL_TABLET | Freq: Two times a day (BID) | ORAL | 0 refills | Status: AC

## 2020-11-12 NOTE — Progress Notes (Signed)
Subjective:  Patient ID: Kerry Matthews, male    DOB: 03/20/1936  Age: 85 y.o. MRN: [id number]  Chief Complaint  Patient presents with  . Pre-op Exam    HPI  Pt is here for preoperative cv clearance. Pt has diabetes, hypertensive cv disease, CAD, hyperlipidemia, interstitial lung disease (CANCA/+Rheum factor) and recurrent UTIs. He is here for preoperative clearance from a medical stand point. Cardiology has cleared him from a cv standpoint. Labwork done last week showed elevated LFTs. Also was positive for UTI.   Current Outpatient Medications on File Prior to Visit  Medication Sig Dispense Refill  . acetaminophen (TYLENOL) 500 MG tablet Take 500-1,000 mg by mouth every 6 (six) hours as needed for fever or headache (pain).    Marland Kitchen aspirin EC 81 MG tablet Take 1 tablet (81 mg total) by mouth 2 (two) times daily. To be taken after surgery 84 tablet 0  . atorvastatin (LIPITOR) 20 MG tablet TAKE 1 TABLET (20 MG TOTAL) BY MOUTH AT BEDTIME. 90 tablet 1  . Blood Glucose Monitoring Suppl (TRUE METRIX METER) DEVI E11.69 Use as directed 1 each 0  . cephALEXin (KEFLEX) 500 MG capsule Take 1 capsule (500 mg total) by mouth 4 (four) times daily. To be taken after surgery 40 capsule 0  . Coenzyme Q10 (CO Q 10) 100 MG CAPS Take 100 mg by mouth at bedtime.     . docusate sodium (COLACE) 100 MG capsule Take 1 capsule (100 mg total) by mouth daily as needed. 30 capsule 2  . furosemide (LASIX) 20 MG tablet Take 20 mg by mouth daily as needed for fluid.    Marland Kitchen gabapentin (NEURONTIN) 400 MG capsule TAKE 1 CAPSULE THREE TIMES DAILY (Patient taking differently: Take 400 mg by mouth 3 (three) times daily.) 270 capsule 0  . glucose blood (TRUE METRIX BLOOD GLUCOSE TEST) test strip E11.69 TEST BLOOD SUGAR THREE TIMES DAILY BEFORE MEALS 300 strip 3  . losartan (COZAAR) 25 MG tablet Take 1 tablet (25 mg total) by mouth daily. (Patient not taking: No sig reported) 90 tablet 1  . methocarbamol (ROBAXIN) 500 MG tablet Take  1 tablet (500 mg total) by mouth 2 (two) times daily as needed. To be taken after surgery 20 tablet 0  . Multiple Vitamins-Minerals (PRESERVISION AREDS 2) CAPS Take 1 capsule by mouth 2 (two) times daily after a meal.    . nitroGLYCERIN (NITROSTAT) 0.4 MG SL tablet Place 1 tablet (0.4 mg total) under the tongue every 5 (five) minutes as needed for chest pain. 25 tablet 6  . Omega-3 Fatty Acids (FISH OIL) 1000 MG CAPS Take 1,000 mg by mouth 2 (two) times daily.     Marland Kitchen omeprazole (PRILOSEC) 20 MG capsule TAKE 1 CAPSULE EVERY DAY (Patient taking differently: Take 20 mg by mouth daily.) 90 capsule 1  . ondansetron (ZOFRAN) 4 MG tablet Take 1 tablet (4 mg total) by mouth every 8 (eight) hours as needed for nausea or vomiting. 40 tablet 0  . oxyCODONE-acetaminophen (PERCOCET) 5-325 MG tablet Take 1-2 tablets by mouth every 6 (six) hours as needed. To be taken after surgery 40 tablet 0  . riTUXimab (RITUXAN) 100 MG/10ML injection Inject 100 mg into the vein every 6 (six) months.    . vitamin B-12 (CYANOCOBALAMIN) 1000 MCG tablet Take 1 tablet (1,000 mcg total) by mouth daily. (Patient taking differently: Take 1,000 mcg by mouth daily before breakfast.) 30 tablet 0   No current facility-administered medications on file prior to visit.  Past Medical History:  Diagnosis Date  . 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 Abnormal CT of the chest   . Abnormal findings on diagnostic imaging of lung 04/12/2019   04/01/2019-CT chest with contrast- no acute process in chest abdomen or pelvis, interstitial lung disease suspicious for early or mild UIP, pulmonary artery enlargement suggest PAH   . Acute low back pain without sciatica 03/08/2020  . Acute lower UTI 07/07/2020  . Acute pain of left knee 11/30/2019  . AKI  (acute kidney injury) ([place])   . Alpha-1-antitrypsin deficiency carrier 05/04/2019  . Anemia   . Atherosclerotic heart disease of native coronary artery without angina pectoris 05/03/2018  . Bradycardia   . CKD (chronic kidney disease) 05/03/2018  . Community acquired pneumonia   . Coronary artery disease   . Cough   . Diabetes ([place])   . Diabetes mellitus type 2 in obese ([place]) 04/01/2019  . Dyslipidemia associated with type 2 diabetes mellitus ([place]) 05/03/2018  . Elevated LFTs 04/01/2019  . Elevated rheumatoid factor 04/12/2019   04/03/2019-rheumatoid factor-95.4  . Emphysema (subcutaneous) (surgical) resulting from a procedure 05/03/2018  . Essential hypertension 05/03/2018  . Fever   . GAD (generalized anxiety disorder)   . GERD (gastroesophageal reflux disease)   . History of alpha-1-antitrypsin deficiency 04/12/2019  . Hyperlipidemia   . Hypertension   . Hypoxemia   . Insomnia 05/03/2018  . Interstitial pulmonary disease ([place]) 04/12/2019   04/01/2019-CT chest with contrast- no acute process in chest abdomen or pelvis, interstitial lung disease suspicious for early or mild UIP, pulmonary artery enlargement suggest PAH  04/02/2019-connective tissue work-up: Anti-Jo 1-negative Anti-DNA antibody double-stranded-negative Anti-scleroderma antibody-negative Sjogren's syndrome antibody-negative Sjogren's syndrome antibody-negative CK-31 CCP-6 E  . Leukocytosis 03/08/2020  . Low vitamin B12 level 04/03/2019  . Lower extremity edema   . Medicare annual wellness visit, subsequent 08/09/2020  . Mixed hyperlipidemia 05/03/2018  . Myalgia 04/19/2019  . Osteoarthritis   . Other emphysema ([place])   . Paresthesias in left hand 11/30/2019  . Pneumonia 04/01/2019  . Pneumonitis   . Polymyositis ([place])   . Primary insomnia   . Renal stones   . Sepsis ([place]) 04/19/2019  . Severe sepsis ([place]) 07/07/2020  . Skin cancer   . Transaminitis   . Trigger finger 05/03/2018  . UC (ulcerative colitis) ([place])   . UTI (urinary tract infection)     Past Surgical History:  Procedure Laterality Date  . ANGIOPLASTY    . BACK SURGERY    . CATARACT EXTRACTION    . COLONOSCOPY  06/16/2005   Mild colitis involving splenic flexure. Colonic polyps, status post polypectomy. Mild pancolonic diverticulitits. Internal hemorrhoids.   . ESOPHAGOGASTRODUODENOSCOPY  04/26/2003   Irregular Z line suggestive of GERD. Mild gastritis status post CLO testing.   Marland Kitchen TRIGGER FINGER RELEASE      Family History  Problem Relation Age of Onset  . Tuberculosis Mother   . Stroke Father   . Pancreatic cancer Sister   . Heart attack Sister   . Lung disease Sister   . Clotting disorder Brother   . Colon cancer Neg Hx   . Esophageal cancer Neg Hx    Social History   Socioeconomic History  . Marital status: Married    Spouse name: Not on file  . Number of  children: 2  . Years of education: Not on file  . Highest education level: Not on file  Occupational History  . Occupation: retired  Tobacco Use  . Smoking status: Never Smoker  . Smokeless tobacco: Never Used  Vaping Use  . Vaping Use: Never used  Substance and Sexual Activity  . Alcohol use: Never  . Drug use: Never  . Sexual activity: Not on file  Other Topics Concern  . Not on file  Social History Narrative  . Not on file   Social Determinants of Health   Financial Resource Strain: Not on file  Food Insecurity: No Food Insecurity  . Worried About Charity fundraiser in the Last Year: Never true  . Ran Out of Food in the Last Year: Never true  Transportation Needs: No Transportation Needs  . Lack of Transportation (Medical): No  . Lack of Transportation (Non-Medical): No  Physical Activity: Sufficiently Active  . Days of Exercise per Week: 5 days  . Minutes of Exercise per Session: 30 min  Stress: Not on file  Social Connections: Not on file    Review of Systems  Constitutional: Negative for chills, diaphoresis, fatigue and fever.  HENT: Negative for congestion, ear pain and  sore throat.   Respiratory: Negative for cough and shortness of breath.   Cardiovascular: Negative for chest pain and leg swelling.  Gastrointestinal: Negative for abdominal pain, constipation, diarrhea, nausea and vomiting.  Genitourinary: Negative for dysuria and urgency.  Musculoskeletal: Positive for arthralgias (left knee pain). Negative for myalgias.  Neurological: Negative for dizziness and headaches.  Psychiatric/Behavioral: Negative for dysphoric mood.     Objective:  BP 124/70   Pulse 78   Temp 97.8 F (36.6 C)   Resp 18   Ht 5\' 7"  (1.702 m)   Wt 189 lb 9.6 oz (86 kg)   BMI 29.70 kg/m   BP/Weight 11/13/2020 11/08/2020 02/28/2375  Systolic BP 283 151 -  Diastolic BP 70 77 -  Wt. (Lbs) 189.6 191.44 190  BMI 29.7 29.11 29.76    Physical Exam Vitals reviewed.  Constitutional:      Appearance: Normal appearance. He is normal weight.  Cardiovascular:     Rate and Rhythm: Normal rate and regular rhythm.     Heart sounds: No murmur heard.   Pulmonary:     Effort: Pulmonary effort is normal.     Breath sounds: Normal breath sounds.  Abdominal:     General: Abdomen is flat. Bowel sounds are normal.     Palpations: Abdomen is soft.     Tenderness: There is no abdominal tenderness.  Neurological:     Mental Status: He is alert and oriented to person, place, and time.  Psychiatric:        Mood and Affect: Mood normal.        Behavior: Behavior normal.     Diabetic Foot Exam - Simple   No data filed      Lab Results  Component Value Date   WBC 9.4 11/08/2020   HGB 14.5 11/08/2020   HCT 43.0 11/08/2020   PLT 252 11/08/2020   GLUCOSE 163 (H) 11/13/2020   CHOL 168 04/04/2020   TRIG 76 04/04/2020   HDL 43 04/04/2020   LDLCALC 111 (H) 04/04/2020   ALT 101 (H) 11/13/2020   AST 74 (H) 11/13/2020   NA 138 11/13/2020   K 4.8 11/13/2020   CL 98 11/13/2020   CREATININE 1.24 11/13/2020   BUN 20 11/13/2020  CO2 22 11/13/2020   TSH 2.690 12/01/2019   INR 1.1  11/08/2020   HGBA1C 6.2 (H) 10/02/2020   MICROALBUR 10 03/06/2020      Assessment & Plan:   1. Pre-op examination Cleared for surgery - POCT urinalysis dipstick  2. Elevated LFTs - Comprehensive metabolic panel  3. Essential hypertension The current medical regimen is effective;  continue present plan and medications.  4. Diabetic polyneuropathy associated with type 2 diabetes mellitus ([place]) Well controlled.   5. OA left knee Cleared for surgery.    Orders Placed This Encounter  Procedures  . Comprehensive metabolic panel  . Hepatitis panel, acute  . Specimen status report  . POCT urinalysis dipstick     Follow-up: Return in about 4 months (around 03/15/2021) for fasting.  An After Visit Summary was printed and given to the patient.  [name], MD [facility] [phone]

## 2020-11-12 NOTE — Progress Notes (Signed)
Can you call patient and let him know he has a uti and that I have called in abx to start asap

## 2020-11-13 ENCOUNTER — Ambulatory Visit (INDEPENDENT_AMBULATORY_CARE_PROVIDER_SITE_OTHER): Payer: Medicare HMO | Admitting: Family Medicine

## 2020-11-13 ENCOUNTER — Other Ambulatory Visit: Payer: Self-pay

## 2020-11-13 VITALS — BP 124/70 | HR 78 | Temp 97.8°F | Resp 18 | Ht 67.0 in | Wt 189.6 lb

## 2020-11-13 DIAGNOSIS — E1142 Type 2 diabetes mellitus with diabetic polyneuropathy: Secondary | ICD-10-CM | POA: Diagnosis not present

## 2020-11-13 DIAGNOSIS — I10 Essential (primary) hypertension: Secondary | ICD-10-CM

## 2020-11-13 DIAGNOSIS — Z01818 Encounter for other preprocedural examination: Secondary | ICD-10-CM

## 2020-11-13 DIAGNOSIS — M1712 Unilateral primary osteoarthritis, left knee: Secondary | ICD-10-CM

## 2020-11-13 DIAGNOSIS — R7989 Other specified abnormal findings of blood chemistry: Secondary | ICD-10-CM | POA: Diagnosis not present

## 2020-11-13 LAB — POCT URINALYSIS DIPSTICK
Bilirubin, UA: NEGATIVE
Blood, UA: NEGATIVE
Glucose, UA: NEGATIVE
Ketones, UA: NEGATIVE
Leukocytes, UA: NEGATIVE
Nitrite, UA: NEGATIVE
Protein, UA: NEGATIVE
Spec Grav, UA: 1.025 (ref 1.010–1.025)
Urobilinogen, UA: 0.2 E.U./dL
pH, UA: 6 (ref 5.0–8.0)

## 2020-11-14 ENCOUNTER — Telehealth: Payer: Self-pay

## 2020-11-14 LAB — COMPREHENSIVE METABOLIC PANEL
ALT: 101 IU/L — ABNORMAL HIGH (ref 0–44)
AST: 74 IU/L — ABNORMAL HIGH (ref 0–40)
Albumin/Globulin Ratio: 1.4 (ref 1.2–2.2)
Albumin: 3.9 g/dL (ref 3.6–4.6)
Alkaline Phosphatase: 429 IU/L — ABNORMAL HIGH (ref 44–121)
BUN/Creatinine Ratio: 16 (ref 10–24)
BUN: 20 mg/dL (ref 8–27)
Bilirubin Total: 0.8 mg/dL (ref 0.0–1.2)
CO2: 22 mmol/L (ref 20–29)
Calcium: 9.1 mg/dL (ref 8.6–10.2)
Chloride: 98 mmol/L (ref 96–106)
Creatinine, Ser: 1.24 mg/dL (ref 0.76–1.27)
Globulin, Total: 2.7 g/dL (ref 1.5–4.5)
Glucose: 163 mg/dL — ABNORMAL HIGH (ref 65–99)
Potassium: 4.8 mmol/L (ref 3.5–5.2)
Sodium: 138 mmol/L (ref 134–144)
Total Protein: 6.6 g/dL (ref 6.0–8.5)
eGFR: 58 mL/min/{1.73_m2} — ABNORMAL LOW (ref >59)

## 2020-11-14 NOTE — Telephone Encounter (Signed)
Yes he can

## 2020-11-14 NOTE — Telephone Encounter (Signed)
Would like to know if he can continue Rx since Su was Cx.   Aspirin Vitamin B-12 Eye Vitamins Fish oil

## 2020-11-14 NOTE — Telephone Encounter (Signed)
Patient aware.

## 2020-11-20 LAB — HEPATITIS PANEL, ACUTE
Hep A IgM: NEGATIVE
Hep B C IgM: NEGATIVE
Hep C Virus Ab: <0.1 s/co ratio (ref 0.0–0.9)
Hepatitis B Surface Ag: NEGATIVE

## 2020-11-21 ENCOUNTER — Other Ambulatory Visit: Payer: Self-pay

## 2020-11-25 ENCOUNTER — Encounter: Payer: Self-pay | Admitting: Family Medicine

## 2020-11-27 ENCOUNTER — Inpatient Hospital Stay: Payer: Medicare HMO | Admitting: Orthopaedic Surgery

## 2020-12-03 DIAGNOSIS — M317 Microscopic polyangiitis: Secondary | ICD-10-CM | POA: Diagnosis not present

## 2020-12-03 DIAGNOSIS — Z79899 Other long term (current) drug therapy: Secondary | ICD-10-CM | POA: Diagnosis not present

## 2020-12-03 DIAGNOSIS — Z6828 Body mass index (BMI) 28.0-28.9, adult: Secondary | ICD-10-CM | POA: Diagnosis not present

## 2020-12-03 DIAGNOSIS — E663 Overweight: Secondary | ICD-10-CM | POA: Diagnosis not present

## 2020-12-03 DIAGNOSIS — R768 Other specified abnormal immunological findings in serum: Secondary | ICD-10-CM | POA: Diagnosis not present

## 2020-12-03 DIAGNOSIS — Z125 Encounter for screening for malignant neoplasm of prostate: Secondary | ICD-10-CM | POA: Diagnosis not present

## 2020-12-03 DIAGNOSIS — J84112 Idiopathic pulmonary fibrosis: Secondary | ICD-10-CM | POA: Diagnosis not present

## 2020-12-03 DIAGNOSIS — Q625 Duplication of ureter: Secondary | ICD-10-CM | POA: Diagnosis not present

## 2020-12-03 DIAGNOSIS — R3915 Urgency of urination: Secondary | ICD-10-CM | POA: Diagnosis not present

## 2020-12-03 DIAGNOSIS — R310 Gross hematuria: Secondary | ICD-10-CM | POA: Diagnosis not present

## 2020-12-03 DIAGNOSIS — K4030 Unilateral inguinal hernia, with obstruction, without gangrene, not specified as recurrent: Secondary | ICD-10-CM | POA: Diagnosis not present

## 2020-12-04 NOTE — Progress Notes (Signed)
Chronic Care Management Pharmacy Note  12/06/2020 Name:  Kerry Matthews MRN:  [id number] DOB:  03/05/1936  Subjective: Kerry Matthews is an 85 y.o. year old male who is a primary patient of Cox, Kirsten, MD.  The CCM team was consulted for assistance with disease management and care coordination needs.    Plan Updates:   Patient is rescheduled for knee replacement surgery 04/25.   Patient is having symptoms of UTI today. Has called urology. Was checked on Monday at urology office and was clear. Patient had a test during visit at urology that they warned may cause some irritation. Wife is concerned that if it is a UTI could cause a delay in knee surgery. Pharmacist advised patient to call back and schedule acute visit with our office if can't see urology due to [place] weekend.   Engaged with patient by telephone for follow up visit in response to provider referral for pharmacy case management and/or care coordination services.   Consent to Services:  The patient was given information about Chronic Care Management services, agreed to services, and gave verbal consent prior to initiation of services.  Please see initial visit note for detailed documentation.   Patient Care Team: [name], MD as PCP - General (Family Medicine) Burnice Logan, [facility] as Pharmacist (Pharmacist)  Recent office visits: 11/13/2020 - cleared for surgery.  10/08/2020 - augmentin and rocephin shot x1 for acute pyelonephritis. Hold losartan for dizziness.  09/13/2020 - referral to orthopedic surgery. Kenalog shot in knee.  09/06/2020 - urology referral.  08/09/2020 - stop Lasix and wear tall socks. [place] referral for knee pain. Cardiology to evaluate low bp. Take bp at home.  07/17/2020 - finish keflex. Hold meloxicam. Lasix 40 mg - 1/2 tablet daily for 1 week. Avoid salt and elevate legs.  07/04/2020 - cipro for UTI.  Recent consult visits: 10/02/2020 - ortho - recommend total knee replacement.  09/06/2020 -  cardio - emphasized heart-healthy diet and salt intake.   Hospital visits:  07/07/2020 - ED to hospital admission for sepsis.  Objective:  Lab Results  Component Value Date   CREATININE 1.24 11/13/2020   BUN 20 11/13/2020   GFRNONAA >60 11/08/2020   GFRAA 55 (L) 07/26/2020   NA 138 11/13/2020   K 4.8 11/13/2020   CALCIUM 9.1 11/13/2020   CO2 22 11/13/2020   GLUCOSE 163 (H) 11/13/2020    Lab Results  Component Value Date/Time   HGBA1C 6.2 (H) 10/02/2020 11:03 AM   HGBA1C 5.7 (H) 07/08/2020 04:17 AM   MICROALBUR 10 03/06/2020 03:07 PM    Last diabetic Eye exam: No results found for: HMDIABEYEEXA  Last diabetic Foot exam: No results found for: HMDIABFOOTEX   Lab Results  Component Value Date   CHOL 168 04/04/2020   HDL 43 04/04/2020   LDLCALC 111 (H) 04/04/2020   TRIG 76 04/04/2020   CHOLHDL 3.9 04/04/2020    Hepatic Function Latest Ref Rng & Units 11/13/2020 11/08/2020 07/26/2020  Total Protein 6.0 - 8.5 g/dL 6.6 6.7 6.2  Albumin 3.6 - 4.6 g/dL 3.9 3.8 4.1  AST 0 - 40 IU/L 74(H) 77(H) 26  ALT 0 - 44 IU/L 101(H) 118(H) 23  Alk Phosphatase 44 - 121 IU/L 429(H) 280(H) 137(H)  Total Bilirubin 0.0 - 1.2 mg/dL 0.8 1.1 0.6  Bilirubin, Direct 0.0 - 0.2 mg/dL - - -    Lab Results  Component Value Date/Time   TSH 2.690 12/01/2019 09:04 AM    CBC Latest Ref Rng &  Units 11/08/2020 07/17/2020 07/09/2020  WBC 4.0 - 10.5 K/uL 9.4 7.1 12.9(H)  Hemoglobin 13.0 - 17.0 g/dL 14.5 12.4(L) 11.2(L)  Hematocrit 39.0 - 52.0 % 43.0 36.8(L) 34.2(L)  Platelets 150 - 400 K/uL 252 498(H) 205    No results found for: VD25OH  Clinical ASCVD: No  The ASCVD Risk score Mikey Bussing DC Jr., et al., 2013) failed to calculate for the following reasons:   The 2013 ASCVD risk score is only valid for ages 14 to 69    Depression screen PHQ 2/9 09/13/2020 08/09/2020 07/04/2020  Decreased Interest 0 0 0  Down, Depressed, Hopeless 0 0 0  PHQ - 2 Score 0 0 0     Social History   Tobacco Use  Smoking  Status Never Smoker  Smokeless Tobacco Never Used   BP Readings from Last 3 Encounters:  11/13/20 124/70  11/08/20 (!) 163/77  09/13/20 (!) 132/58   Pulse Readings from Last 3 Encounters:  11/13/20 78  11/08/20 68  09/13/20 60   Wt Readings from Last 3 Encounters:  11/13/20 189 lb 9.6 oz (86 kg)  11/08/20 191 lb 7 oz (86.8 kg)  10/08/20 190 lb (86.2 kg)   BMI Readings from Last 3 Encounters:  11/13/20 29.70 kg/m  11/08/20 29.11 kg/m  10/08/20 29.76 kg/m    Assessment/Interventions: Review of patient past medical history, allergies, medications, health status, including review of consultants reports, laboratory and other test data, was performed as part of comprehensive evaluation and provision of chronic care management services.   SDOH:  (Social Determinants of Health) assessments and interventions performed: Yes  SDOH Screenings   Alcohol Screen: Not on file  Depression (PHQ2-9): Low Risk   . PHQ-2 Score: 0  Financial Resource Strain: Not on file  Food Insecurity: No Food Insecurity  . Worried About Charity fundraiser in the Last Year: Never true  . Ran Out of Food in the Last Year: Never true  Housing: Low Risk   . Last Housing Risk Score: 0  Physical Activity: Sufficiently Active  . Days of Exercise per Week: 5 days  . Minutes of Exercise per Session: 30 min  Social Connections: Not on file  Stress: Not on file  Tobacco Use: Low Risk   . Smoking Tobacco Use: Never Smoker  . Smokeless Tobacco Use: Never Used  Transportation Needs: No Transportation Needs  . Lack of Transportation (Medical): No  . Lack of Transportation (Non-Medical): No    CCM Care Plan  Allergies  Allergen Reactions  . Ace Inhibitors Other (See Comments)    Slow heart rate  . Hydrocodone Nausea And Vomiting  . Hydrocodone-Acetaminophen Nausea And Vomiting  . Sulfamethoxazole-Trimethoprim Nausea And Vomiting    Medications Reviewed Today    Reviewed by Burnice Logan, [facility]  (Pharmacist) on 12/06/20 at 0844  Med List Status: <None>  Medication Order Taking? Sig Documenting Provider Last Dose Status Informant  aspirin EC 81 MG tablet [id number] Yes Take 1 tablet (81 mg total) by mouth 2 (two) times daily. To be taken after surgery Aundra Dubin, PA-C Taking Active   atorvastatin (LIPITOR) 20 MG tablet [id number] No TAKE 1 TABLET (20 MG TOTAL) BY MOUTH AT BEDTIME.  Patient not taking: Reported on 12/06/2020   [name], MD Not Taking Active Spouse/Significant Other  Blood Glucose Monitoring Suppl Suzan Nailer Antonietta Breach) DEVI [id number] Yes E11.69 Use as directed Cox, Elnita Maxwell, MD Taking Active Spouse/Significant Other  cephALEXin (KEFLEX) 500 MG capsule [id number] No Take 1 capsule (500  mg total) by mouth 4 (four) times daily. To be taken after surgery  Patient not taking: Reported on 12/06/2020   Aundra Dubin, PA-C Not Taking Active   Coenzyme Q10 (CO Q 10) 100 MG CAPS [id number] Yes Take 100 mg by mouth at bedtime.  [provider] Taking Active Spouse/Significant Other  docusate sodium (COLACE) 100 MG capsule [id number] Yes Take 1 capsule (100 mg total) by mouth daily as needed. Aundra Dubin, PA-C Taking Active   furosemide (LASIX) 20 MG tablet [id number] Yes Take 20 mg by mouth daily as needed for fluid. [provider] Taking Active Spouse/Significant Other  gabapentin (NEURONTIN) 400 MG capsule [id number] Yes TAKE 1 CAPSULE THREE TIMES DAILY  Patient taking differently: Take 400 mg by mouth 3 (three) times daily.   Cox, Kirsten, MD Taking Active   glucose blood (TRUE METRIX BLOOD GLUCOSE TEST) test strip [id number] Yes E11.69 TEST BLOOD SUGAR THREE TIMES DAILY BEFORE MEALS Cox, Kirsten, MD Taking Active Spouse/Significant Other  methocarbamol (ROBAXIN) 500 MG tablet [id number] No Take 1 tablet (500 mg total) by mouth 2 (two) times daily as needed. To be taken after surgery  Patient not taking: Reported on 12/06/2020   Aundra Dubin, PA-C Not  Taking Active   Multiple Vitamins-Minerals (PRESERVISION AREDS 2) CAPS [id number] Yes Take 1 capsule by mouth 2 (two) times daily after a meal. [provider] Taking Active Spouse/Significant Other  nitroGLYCERIN (NITROSTAT) 0.4 MG SL tablet [id number] Yes Place 1 tablet (0.4 mg total) under the tongue every 5 (five) minutes as needed for chest pain. Revankar, Reita Cliche, MD Taking Active Spouse/Significant Other  Omega-3 Fatty Acids (FISH OIL) 1000 MG CAPS [id number] Yes Take 1,000 mg by mouth 2 (two) times daily.  [provider] Taking Active Spouse/Significant Other  omeprazole (PRILOSEC) 20 MG capsule [id number] Yes TAKE 1 CAPSULE EVERY DAY  Patient taking differently: Take 20 mg by mouth daily.   Cox, Kirsten, MD Taking Active   ondansetron [facility]) 4 MG tablet [id number] No Take 1 tablet (4 mg total) by mouth every 8 (eight) hours as needed for nausea or vomiting.  Patient not taking: Reported on 12/06/2020   Aundra Dubin, PA-C Not Taking Active   oxyCODONE-acetaminophen (PERCOCET) 5-325 MG tablet [id number] No Take 1-2 tablets by mouth every 6 (six) hours as needed. To be taken after surgery  Patient not taking: Reported on 12/06/2020   Aundra Dubin, PA-C Not Taking Active   riTUXimab (RITUXAN) 100 MG/10ML injection [id number] Yes Inject 100 mg into the vein every 6 (six) months. [provider] Taking Active Spouse/Significant Other  vitamin B-12 (CYANOCOBALAMIN) 1000 MCG tablet [id number] Yes Take 1 tablet (1,000 mcg total) by mouth daily.  Patient taking differently: Take 1,000 mcg by mouth daily before breakfast.   Geradine Girt, DO Taking Active           Patient Active Problem List   Diagnosis Date Noted  . Hematuria 09/07/2020  . Proteinuria 09/07/2020  . Dysuria 09/07/2020  . Urinary urgency 09/07/2020  . Renal insufficiency 09/06/2020  . Coronary artery disease   . Diabetes ([place])   . GAD (generalized anxiety disorder)   . Hyperlipidemia   .  Osteoarthritis   . Other emphysema ([place])   . Primary insomnia   . Renal stones   . Skin cancer   . UC (ulcerative colitis) ([place])   . UTI (urinary tract infection)   . Medicare annual wellness visit, subsequent 08/09/2020  . Severe sepsis (  [place]) 07/07/2020  . Acute lower UTI 07/07/2020  . Leukocytosis 03/08/2020  . Acute low back pain without sciatica 03/08/2020  . Peripheral polyneuropathy 01/30/2020  . Body mass index (BMI) 29.0-29.9, adult 01/05/2020  . Burning sensation of feet 01/05/2020  . Acute pain of left knee 11/30/2019  . Paresthesias in left hand 11/30/2019  . Alpha-1-antitrypsin deficiency carrier 05/04/2019  . Sepsis ([place]) 04/19/2019  . Myalgia 04/19/2019  . History of alpha-1-antitrypsin deficiency 04/12/2019  . Elevated rheumatoid factor 04/12/2019  . Interstitial pulmonary disease ([place]) 04/12/2019  . Abnormal ANCA test 04/12/2019  . Abnormal findings on diagnostic imaging of lung 04/12/2019  . Transaminitis   . Hypoxemia   . Polymyositis ([place])   . Low vitamin B12 level 04/03/2019  . Community acquired pneumonia   . Pneumonitis   . Cough   . Fever   . Abnormal CT of the chest   . Lower extremity edema   . AKI (acute kidney injury) ([place])   . Anemia   . Pneumonia 04/01/2019  . Elevated LFTs 04/01/2019  . Diabetes mellitus type 2 in obese ([place]) 04/01/2019  . Dyslipidemia associated with type 2 diabetes mellitus ([place]) 05/03/2018  . Essential hypertension 05/03/2018  . Mixed hyperlipidemia 05/03/2018  . Atherosclerotic heart disease of native coronary artery without angina pectoris 05/03/2018  . GERD (gastroesophageal reflux disease) 05/03/2018  . Trigger finger 05/03/2018  . Bradycardia 05/03/2018  . CKD (chronic kidney disease) 05/03/2018  . Insomnia 05/03/2018  . Emphysema (subcutaneous) (surgical) resulting from a procedure 05/03/2018    Immunization History  Administered Date(s) Administered  . PFIZER(Purple Top)SARS-COV-2 Vaccination 10/17/2019,  11/07/2019, 06/05/2020    Conditions to be addressed/monitored:  Hypertension, Hyperlipidemia and Diabetes  Care Plan : [place]  Updates made by Burnice Logan, [place] since 12/06/2020 12:00 AM    Problem: htn,hld,dm   Priority: High  Onset Date: 12/06/2020    Long-Range Goal: disease state management   Start Date: 12/06/2020  Expected End Date: 12/06/2021  This Visit's Progress: On track  Priority: High  Note:     Current Barriers:  . Unable to maintain control of urinary symptoms. Seeing urology.   Pharmacist Clinical Goal(s):  Marland Kitchen Patient will maintain control of urological symptoms as evidenced by improvement in symptoms.   through collaboration with PharmD and provider.    Interventions: . 1:1 collaboration with [name], MD regarding development and update of comprehensive plan of care as evidenced by provider attestation and co-signature . Inter-disciplinary care team collaboration (see longitudinal plan of care) . Comprehensive medication review performed; medication list updated in electronic medical record  Hypertension (BP goal <130/80) -Controlled -Current treatment: . Furosemide 20 mg daily prn fluid -Medications previously tried: losartan  -Current home readings: well controlled per wife.  -Current dietary habits: watching salt.  -Current exercise habits: knee is limiting but will have lots of rehab after surgery.  -Denies hypotensive/hypertensive symptoms -Educated on BP goals and benefits of medications for prevention of heart attack, stroke and kidney damage; Daily salt intake goal < 2300 mg; Exercise goal of 150 minutes per week; -Counseled to monitor BP at home weekly, document, and provide log at future appointments -Counseled on diet and exercise extensively Recommended to continue current medication  Hyperlipidemia: (LDL goal < 55) -Uncontrolled -Current treatment: . Fish oil 1000 mg bid  -Medications previously tried: atorvastatin   -Current dietary patterns: working on healthy diet -Current exercise habits: limited due to knee pain. Will begin rehab after surgery.  -Educated  on Cholesterol goals;  Benefits of statin for ASCVD risk reduction; Importance of limiting foods high in cholesterol; Exercise goal of 150 minutes per week; -Counseled on diet and exercise extensively  Diabetes (A1c goal <7%) -Controlled -Current medications: . True Metrix test strips and lancets . Diet/lifestyle  -Medications previously tried: none reported  -Current home glucose readings . fasting glucose: 90-120 mg/dl . post prandial glucose: n/a -Denies hypoglycemic/hyperglycemic symptoms -Current meal patterns:  . Heart-healthy and lower sugar diet. Likes meat, non-starchy vegetables and apples.  -Current exercise: limited due to knee pain.  -Educated on A1c and blood sugar goals; Complications of diabetes including kidney damage, retinal damage, and cardiovascular disease; Exercise goal of 150 minutes per week; Carbohydrate counting and/or plate method -Counseled to check feet daily and get yearly eye exams -Counseled on diet and exercise extensively   Patient Goals/Self-Care Activities . Patient will:  - take medications as prescribed focus on medication adherence by using pill box check glucose 2-3 times weekly, document, and provide at future appointments check blood pressure weekly, document, and provide at future appointments target a minimum of 150 minutes of moderate intensity exercise weekly  Follow Up Plan: Telephone follow up appointment with care management team member scheduled for: 05/2022      Medication Assistance: None required.  Patient affirms current coverage meets needs.  Patient's preferred pharmacy is:  [facility], [place] [address] Phone: [phone] Fax: [phone]  [facility] [address] [place]  [place] [address] Phone: [phone] Fax: [phone]  Uses pill box? Yes Pt endorses good compliance  We discussed: Benefits of medication synchronization, packaging and delivery as well as enhanced pharmacist oversight with [employer]. Patient decided to: Continue current medication management strategy  Care Plan and Follow Up Patient Decision:  Patient agrees to Care Plan and Follow-up.  Plan: Telephone follow up appointment with care management team member scheduled for:  11/2021

## 2020-12-06 ENCOUNTER — Other Ambulatory Visit: Payer: Self-pay

## 2020-12-06 ENCOUNTER — Ambulatory Visit (INDEPENDENT_AMBULATORY_CARE_PROVIDER_SITE_OTHER): Payer: Medicare HMO

## 2020-12-06 DIAGNOSIS — I2510 Atherosclerotic heart disease of native coronary artery without angina pectoris: Secondary | ICD-10-CM | POA: Diagnosis not present

## 2020-12-06 DIAGNOSIS — E1142 Type 2 diabetes mellitus with diabetic polyneuropathy: Secondary | ICD-10-CM

## 2020-12-06 DIAGNOSIS — I10 Essential (primary) hypertension: Secondary | ICD-10-CM | POA: Diagnosis not present

## 2020-12-06 NOTE — Patient Instructions (Addendum)
Visit Information  Goals Addressed            This Visit's Progress   . Learn More About My Health       Timeframe:  Long-Range Goal Priority:  High Start Date:                             Expected End Date:                        Follow Up Date 11/29/2021   - tell my story and reason for my visit - repeat what I heard to make sure I understand - bring a list of my medicines to the visit    Why is this important?    The best way to learn about your health and care is by talking to the doctor and nurse.   They will answer your questions and give you information in the way that you like best.    Notes:     Marland Kitchen Manage My Medicine       Timeframe:  Long-Range Goal Priority:  High Start Date:                             Expected End Date:                       Follow Up Date 11/29/2021   - call for medicine refill 2 or 3 days before it runs out - keep a list of all the medicines I take; vitamins and herbals too - use a pillbox to sort medicine    Why is this important?   . These steps will help you keep on track with your medicines.   Notes:     Marland Kitchen Monitor and Manage My Blood Sugar-Diabetes Type 2       Timeframe:  Long-Range Goal Priority:  High Start Date:                             Expected End Date:                       Follow Up Date 11/29/2021    - check blood sugar at prescribed times - take the blood sugar log to all doctor visits    Why is this important?    Checking your blood sugar at home helps to keep it from getting very high or very low.   Writing the results in a diary or log helps the doctor know how to care for you.   Your blood sugar log should have the time, date and the results.   Also, write down the amount of insulin or other medicine that you take.   Other information, like what you ate, exercise done and how you were feeling, will also be helpful.     Notes:       Patient Care Plan: CCM Pharmacy Care Plan    Problem  Identified: htn,hld,dm   Priority: High  Onset Date: 12/06/2020    Long-Range Goal: disease state management   Start Date: 12/06/2020  Expected End Date: 12/06/2021  This Visit's Progress: On track  Priority: High  Note:     Current Barriers:  . Unable to maintain control of urinary symptoms. Seeing urology.  Pharmacist Clinical Goal(s):  Marland Kitchen Patient will maintain control of urological symptoms as evidenced by improvement in symptoms.   through collaboration with PharmD and provider.    Interventions: . 1:1 collaboration with [name], MD regarding development and update of comprehensive plan of care as evidenced by provider attestation and co-signature . Inter-disciplinary care team collaboration (see longitudinal plan of care) . Comprehensive medication review performed; medication list updated in electronic medical record  Hypertension (BP goal <130/80) -Controlled -Current treatment: . Furosemide 20 mg daily prn fluid -Medications previously tried: losartan  -Current home readings: well controlled per wife.  -Current dietary habits: watching salt.  -Current exercise habits: knee is limiting but will have lots of rehab after surgery.  -Denies hypotensive/hypertensive symptoms -Educated on BP goals and benefits of medications for prevention of heart attack, stroke and kidney damage; Daily salt intake goal < 2300 mg; Exercise goal of 150 minutes per week; -Counseled to monitor BP at home weekly, document, and provide log at future appointments -Counseled on diet and exercise extensively Recommended to continue current medication  Hyperlipidemia: (LDL goal < 55) -Uncontrolled -Current treatment: . Fish oil 1000 mg bid  -Medications previously tried: atorvastatin  -Current dietary patterns: working on healthy diet -Current exercise habits: limited due to knee pain. Will begin rehab after surgery.  -Educated on Cholesterol goals;  Benefits of statin for ASCVD risk  reduction; Importance of limiting foods high in cholesterol; Exercise goal of 150 minutes per week; -Counseled on diet and exercise extensively  Diabetes (A1c goal <7%) -Controlled -Current medications: . True Metrix test strips and lancets . Diet/lifestyle  -Medications previously tried: none reported  -Current home glucose readings . fasting glucose: 90-120 mg/dl . post prandial glucose: n/a -Denies hypoglycemic/hyperglycemic symptoms -Current meal patterns:  . Heart-healthy and lower sugar diet. Likes meat, non-starchy vegetables and apples.  -Current exercise: limited due to knee pain.  -Educated on A1c and blood sugar goals; Complications of diabetes including kidney damage, retinal damage, and cardiovascular disease; Exercise goal of 150 minutes per week; Carbohydrate counting and/or plate method -Counseled to check feet daily and get yearly eye exams -Counseled on diet and exercise extensively   Patient Goals/Self-Care Activities . Patient will:  - take medications as prescribed focus on medication adherence by using pill box check glucose 2-3 times weekly, document, and provide at future appointments check blood pressure weekly, document, and provide at future appointments target a minimum of 150 minutes of moderate intensity exercise weekly  Follow Up Plan: Telephone follow up appointment with care management team member scheduled for: 11/2021      The patient verbalized understanding of instructions, educational materials, and care plan provided today and declined offer to receive copy of patient instructions, educational materials, and care plan.  Telephone follow up appointment with pharmacy team member scheduled for: 04 /2023  Burnice Logan, [facility]  Blood Pressure Record Sheet To take your blood pressure, you will need a blood pressure machine. You can buy a blood pressure machine (blood pressure monitor) at your clinic, drug store, or online. When choosing one,  consider:  An automatic monitor that has an arm cuff.  A cuff that wraps snugly around your upper arm. You should be able to fit only one finger between your arm and the cuff.  A device that stores blood pressure reading results.  Do not choose a monitor that measures your blood pressure from your wrist or finger. Follow your health care provider's instructions for how to take your blood  pressure. To use this form:  Get one reading in the morning (a.m.) before you take any medicines.  Get one reading in the evening (p.m.) before supper.  Take at least 2 readings with each blood pressure check. This makes sure the results are correct. Wait 1-2 minutes between measurements.  Write down the results in the spaces on this form.  Repeat this once a week, or as told by your health care provider.  Make a follow-up appointment with your health care provider to discuss the results. Blood pressure log Date: _______________________  a.m. _____________________(1st reading) _____________________(2nd reading)  p.m. _____________________(1st reading) _____________________(2nd reading) Date: _______________________  a.m. _____________________(1st reading) _____________________(2nd reading)  p.m. _____________________(1st reading) _____________________(2nd reading) Date: _______________________  a.m. _____________________(1st reading) _____________________(2nd reading)  p.m. _____________________(1st reading) _____________________(2nd reading) Date: _______________________  a.m. _____________________(1st reading) _____________________(2nd reading)  p.m. _____________________(1st reading) _____________________(2nd reading) Date: _______________________  a.m. _____________________(1st reading) _____________________(2nd reading)  p.m. _____________________(1st reading) _____________________(2nd reading) This information is not intended to replace advice given to you by your health care  provider. Make sure you discuss any questions you have with your health care provider. Document Revised: 11/30/2019 Document Reviewed: 11/30/2019 Elsevier Patient Education  2021 [employer].

## 2020-12-12 ENCOUNTER — Telehealth: Payer: Self-pay | Admitting: *Deleted

## 2020-12-12 ENCOUNTER — Other Ambulatory Visit: Payer: Self-pay | Admitting: Physician Assistant

## 2020-12-12 NOTE — Care Plan (Signed)
RNCM reached out to patient again after his Left total knee replacement was rescheduled from March to 12/17/20 with Dr. Erlinda Hong. Patient is an Ortho bundle through [facility] and is agreeable to case management. He has a wife that will be assisting at home after discharge. He has a BSC/3in1 and states the church will provide a FWW. Post-op instructions mailed to patient today, which he should receive before surgery. [employer] referral made for CPM to be delivered prior to surgery. Choice provided and referral made for [facility] ([facility]). Patient requested OPPT be in [place] and CM will assist with scheduling this appointment around 2 weeks post op. Will continue to follow for needs.

## 2020-12-12 NOTE — Telephone Encounter (Signed)
Ortho bundle Pre-op call completed. 

## 2020-12-12 NOTE — Progress Notes (Signed)
Surgical Instructions    Your procedure is scheduled on 12/17/20.  Report to [facility] Main Entrance "A" at 10:45 A.M., then check in with the Admitting office.  Call this number if you have problems the morning of surgery:  [phone]   If you have any questions prior to your surgery date call [phone]: Open Monday-Friday 8am-4pm    Remember:  Do not eat after midnight the night before your surgery  You may drink clear liquids until 09:45am the morning of your surgery.   Clear liquids allowed are: Water, Non-Citrus Juices (without pulp), Carbonated Beverages, Clear Tea, Black Coffee Only, and Gatorade  Patient Instructions   . The day of surgery (if you have diabetes): o Drink ONE (1) 10 oz water bottle given to you in your pre admission testing appointment by 09:45am the morning of surgery. Drink in one sitting. Do not sip.  o This drink was given to you during your hospital  pre-op appointment visit.  o Nothing else to drink after completing the  10 oz bottle of water.          If you have questions, please contact your surgeon's office.     Take these medicines the morning of surgery with A SIP OF WATER  gabapentin (NEURONTIN)  omeprazole (PRILOSEC)   As of today, STOP taking any Aspirin (unless otherwise instructed by your surgeon) Aleve, Naproxen, Ibuprofen, Motrin, Advil, Goody's, BC's, all herbal medications, fish oil, and all vitamins.   HOW TO MANAGE YOUR DIABETES BEFORE AND AFTER SURGERY  Why is it important to control my blood sugar before and after surgery? . Improving blood sugar levels before and after surgery helps healing and can limit problems. . A way of improving blood sugar control is eating a healthy diet by: o  Eating less sugar and carbohydrates o  Increasing activity/exercise o  Talking with your doctor about reaching your blood sugar goals . High blood sugars (greater than 180 mg/dL) can raise your risk of infections and slow your  recovery, so you will need to focus on controlling your diabetes during the weeks before surgery. . Make sure that the doctor who takes care of your diabetes knows about your planned surgery including the date and location.  How do I manage my blood sugar before surgery? . Check your blood sugar at least 4 times a day, starting 2 days before surgery, to make sure that the level is not too high or low. . Check your blood sugar the morning of your surgery when you wake up and every 2 hours until you get to the Short Stay unit. o If your blood sugar is less than 70 mg/dL, you will need to treat for low blood sugar: - Do not take insulin. - Treat a low blood sugar (less than 70 mg/dL) with  cup of clear juice (cranberry or apple), 4 glucose tablets, OR glucose gel. - Recheck blood sugar in 15 minutes after treatment (to make sure it is greater than 70 mg/dL). If your blood sugar is not greater than 70 mg/dL on recheck, call [phone] for further instructions. . Report your blood sugar to the short stay nurse when you get to Short Stay.  . If you are admitted to the hospital after surgery: o Your blood sugar will be checked by the staff and you will probably be given insulin after surgery (instead of oral diabetes medicines) to make sure you have good blood sugar levels. o The goal for blood sugar control  after surgery is 80-180 mg/dL.                      Do not wear jewelry, make up, or nail polish            Do not wear lotions, powders, perfumes/colognes, or deodorant.            Do not shave 48 hours prior to surgery.  Men may shave face and neck.            Do not bring valuables to the hospital.            [facility] is not responsible for any belongings or valuables.  Do NOT Smoke (Tobacco/Vaping) or drink Alcohol 24 hours prior to your procedure If you use a CPAP at night, you may bring all equipment for your overnight stay.   Contacts, glasses, dentures or bridgework may not be  worn into surgery, please bring cases for these belongings   For patients admitted to the hospital, discharge time will be determined by your treatment team.   Patients discharged the day of surgery will not be allowed to drive home, and someone needs to stay with them for 24 hours.    Special instructions:   [place]- Preparing For Surgery  Before surgery, you can play an important role. Because skin is not sterile, your skin needs to be as free of germs as possible. You can reduce the number of germs on your skin by washing with CHG (chlorahexidine gluconate) Soap before surgery.  CHG is an antiseptic cleaner which kills germs and bonds with the skin to continue killing germs even after washing.    Oral Hygiene is also important to reduce your risk of infection.  Remember - BRUSH YOUR TEETH THE MORNING OF SURGERY WITH YOUR REGULAR TOOTHPASTE  Please do not use if you have an allergy to CHG or antibacterial soaps. If your skin becomes reddened/irritated stop using the CHG.  Do not shave (including legs and underarms) for at least 48 hours prior to first CHG shower. It is OK to shave your face.  Please follow these instructions carefully.   1. Shower the NIGHT BEFORE SURGERY and the MORNING OF SURGERY  2. If you chose to wash your hair, wash your hair first as usual with your normal shampoo.  3. After you shampoo, rinse your hair and body thoroughly to remove the shampoo.  4. Wash Face and genitals (private parts) with your normal soap.   5.  Shower the NIGHT BEFORE SURGERY and the MORNING OF SURGERY with CHG Soap.   6. Use CHG Soap as you would any other liquid soap. You can apply CHG directly to the skin and wash gently with a scrungie or a clean washcloth.   7. Apply the CHG Soap to your body ONLY FROM THE NECK DOWN.  Do not use on open wounds or open sores. Avoid contact with your eyes, ears, mouth and genitals (private parts). Wash Face and genitals (private parts)  with your  normal soap.   8. Wash thoroughly, paying special attention to the area where your surgery will be performed.  9. Thoroughly rinse your body with warm water from the neck down.  10. DO NOT shower/wash with your normal soap after using and rinsing off the CHG Soap.  11. Pat yourself dry with a CLEAN TOWEL.  12. Wear CLEAN PAJAMAS to bed the night before surgery  13. Place CLEAN SHEETS on your  bed the night before your surgery  14. DO NOT SLEEP WITH PETS.   Day of Surgery: Take a shower with CHG soap. Wear Clean/Comfortable clothing the morning of surgery Do not apply any deodorants/lotions.   Remember to brush your teeth WITH YOUR REGULAR TOOTHPASTE.   Please read over the following fact sheets that you were given.

## 2020-12-13 ENCOUNTER — Encounter (HOSPITAL_COMMUNITY): Payer: Self-pay

## 2020-12-13 ENCOUNTER — Other Ambulatory Visit: Payer: Self-pay

## 2020-12-13 ENCOUNTER — Other Ambulatory Visit (HOSPITAL_COMMUNITY): Payer: Medicare HMO

## 2020-12-13 ENCOUNTER — Encounter (HOSPITAL_COMMUNITY)
Admission: RE | Admit: 2020-12-13 | Discharge: 2020-12-13 | Disposition: A | Discharge status: Home / Self Care | Payer: Medicare HMO | Source: Ambulatory Visit | Attending: Orthopaedic Surgery | Admitting: Orthopaedic Surgery

## 2020-12-13 DIAGNOSIS — Z01812 Encounter for preprocedural laboratory examination: Secondary | ICD-10-CM | POA: Insufficient documentation

## 2020-12-13 DIAGNOSIS — Z20822 Contact with and (suspected) exposure to covid-19: Secondary | ICD-10-CM | POA: Diagnosis not present

## 2020-12-13 HISTORY — DX: Personal history of urinary calculi: Z87.442

## 2020-12-13 LAB — CBC WITH DIFFERENTIAL/PLATELET
Abs Immature Granulocytes: 0.07 10*3/uL (ref 0.00–0.07)
Basophils Absolute: 0.1 10*3/uL (ref 0.0–0.1)
Basophils Relative: 1 %
Eosinophils Absolute: 0.4 10*3/uL (ref 0.0–0.5)
Eosinophils Relative: 5 %
HCT: 42.3 % (ref 39.0–52.0)
Hemoglobin: 13.9 g/dL (ref 13.0–17.0)
Immature Granulocytes: 1 %
Lymphocytes Relative: 28 %
Lymphs Abs: 2.3 10*3/uL (ref 0.7–4.0)
MCH: 28.9 pg (ref 26.0–34.0)
MCHC: 32.9 g/dL (ref 30.0–36.0)
MCV: 87.9 fL (ref 80.0–100.0)
Monocytes Absolute: 0.8 10*3/uL (ref 0.1–1.0)
Monocytes Relative: 9 %
Neutro Abs: 4.9 10*3/uL (ref 1.7–7.7)
Neutrophils Relative %: 56 %
Platelets: 321 10*3/uL (ref 150–400)
RBC: 4.81 MIL/uL (ref 4.22–5.81)
RDW: 12.8 % (ref 11.5–15.5)
WBC: 8.5 10*3/uL (ref 4.0–10.5)
nRBC: 0 % (ref 0.0–0.2)

## 2020-12-13 LAB — URINALYSIS, ROUTINE W REFLEX MICROSCOPIC
Bacteria, UA: NONE SEEN
Bilirubin Urine: NEGATIVE
Glucose, UA: NEGATIVE mg/dL
Hgb urine dipstick: NEGATIVE
Ketones, ur: NEGATIVE mg/dL
Nitrite: NEGATIVE
Protein, ur: NEGATIVE mg/dL
Specific Gravity, Urine: 1.006 (ref 1.005–1.030)
pH: 6 (ref 5.0–8.0)

## 2020-12-13 LAB — TYPE AND SCREEN
ABO/RH(D): A POS
Antibody Screen: NEGATIVE

## 2020-12-13 LAB — COMPREHENSIVE METABOLIC PANEL
ALT: 26 U/L (ref 0–44)
AST: 25 U/L (ref 15–41)
Albumin: 3.6 g/dL (ref 3.5–5.0)
Alkaline Phosphatase: 140 U/L — ABNORMAL HIGH (ref 38–126)
Anion gap: 9 (ref 5–15)
BUN: 16 mg/dL (ref 8–23)
CO2: 24 mmol/L (ref 22–32)
Calcium: 8.8 mg/dL — ABNORMAL LOW (ref 8.9–10.3)
Chloride: 104 mmol/L (ref 98–111)
Creatinine, Ser: 1.14 mg/dL (ref 0.61–1.24)
GFR, Estimated: >60 mL/min (ref >60)
Glucose, Bld: 119 mg/dL — ABNORMAL HIGH (ref 70–99)
Potassium: 4.4 mmol/L (ref 3.5–5.1)
Sodium: 137 mmol/L (ref 135–145)
Total Bilirubin: 0.5 mg/dL (ref 0.3–1.2)
Total Protein: 6.4 g/dL — ABNORMAL LOW (ref 6.5–8.1)

## 2020-12-13 LAB — SURGICAL PCR SCREEN
MRSA, PCR: NEGATIVE
Staphylococcus aureus: NEGATIVE

## 2020-12-13 LAB — PROTIME-INR
INR: 1.1 (ref 0.8–1.2)
Prothrombin Time: 14 seconds (ref 11.4–15.2)

## 2020-12-13 LAB — GLUCOSE, CAPILLARY: Glucose-Capillary: 126 mg/dL — ABNORMAL HIGH (ref 70–99)

## 2020-12-13 LAB — SARS CORONAVIRUS 2 (TAT 6-24 HRS): SARS Coronavirus 2: NEGATIVE

## 2020-12-13 LAB — APTT: aPTT: 30 seconds (ref 24–36)

## 2020-12-13 NOTE — Progress Notes (Signed)
PCP -Dirk Dress MD  Cardiologist -Revenkar   PPM/ICD - denies   Chest x-ray - 11/08/2020 EKG - 09/06/2020 Stress Test -  ECHO - 04/04/2019 Cardiac Cath -   Sleep Study - denies CPAP - denies  Fasting Blood Sugar -110-130  Checks Blood Sugar 2-3_ times a week  Blood Thinner Instructions:na Aspirin Instructions:pt states he stopped over a month ago  ERAS Protcol -clears until 0945 PRE-SURGERY water  COVID TEST- 12/13/2020   Anesthesia review: yes history of echo and abnormal EKG  Patient denies shortness of breath, fever, cough and chest pain at PAT appointment   All instructions explained to the patient, with a verbal understanding of the material. Patient agrees to go over the instructions while at home for a better understanding. Patient also instructed to self quarantine after being tested for COVID-19. The opportunity to ask questions was provided.

## 2020-12-14 MED ORDER — TRANEXAMIC ACID 1000 MG/10ML IV SOLN
2000.0000 mg | INTRAVENOUS | Status: AC
  Administered 2020-12-17: 2000 mg via TOPICAL
  Filled 2020-12-14: qty 20

## 2020-12-14 NOTE — Anesthesia Preprocedure Evaluation (Addendum)
Anesthesia Evaluation  Patient identified by MRN, date of birth, ID band Patient awake    Reviewed: Allergy & Precautions, NPO status , Patient's Chart, lab work & pertinent test results  History of Anesthesia Complications Negative for: history of anesthetic complications  Airway Mallampati: II  TM Distance: >3 FB Neck ROM: Full    Dental  (+) Missing,    Pulmonary COPD,    Pulmonary exam normal        Cardiovascular hypertension, Pt. on medications + CAD  Normal cardiovascular exam  TTE 2020: EF 60-65%, mild LVH, mild LAE, mild MR, mild AR   Neuro/Psych Anxiety negative neurological ROS     GI/Hepatic Neg liver ROS, PUD, GERD  Medicated and Controlled,Ulcerative colitis   Endo/Other  diabetes, Type 2  Renal/GU Renal InsufficiencyRenal disease  negative genitourinary   Musculoskeletal  (+) Arthritis ,   Abdominal   Peds  Hematology negative hematology ROS (+)   Anesthesia Other Findings Day of surgery medications reviewed with patient.  Reproductive/Obstetrics negative OB ROS                           Anesthesia Physical Anesthesia Plan  ASA: III  Anesthesia Plan: Spinal   Post-op Pain Management:  Regional for Post-op pain   Induction:   PONV Risk Score and Plan: 2 and Treatment may vary due to age or medical condition, Ondansetron, Propofol infusion and Dexamethasone  Airway Management Planned: Natural Airway and Simple Face Mask  Additional Equipment: None  Intra-op Plan:   Post-operative Plan:   Informed Consent: I have reviewed the patients History and Physical, chart, labs and discussed the procedure including the risks, benefits and alternatives for the proposed anesthesia with the patient or authorized representative who has indicated his/her understanding and acceptance.     Dental advisory given  Plan Discussed with: CRNA  Anesthesia Plan Comments: (See  recent PAT note by Karoline Caldwell, PA-C on 11/08/2020.  Surgery was postponed at that time.  In the interim he was seen by his PCP Dr. [name] and medically cleared for surgery.  He previously received cardiac clearance for surgery as well.)      Anesthesia Quick Evaluation

## 2020-12-16 ENCOUNTER — Ambulatory Visit: Payer: Self-pay | Admitting: Orthopaedic Surgery

## 2020-12-16 DIAGNOSIS — R52 Pain, unspecified: Secondary | ICD-10-CM

## 2020-12-17 ENCOUNTER — Ambulatory Visit (HOSPITAL_COMMUNITY): Payer: Medicare HMO | Admitting: Vascular Surgery

## 2020-12-17 ENCOUNTER — Ambulatory Visit (HOSPITAL_COMMUNITY): Payer: Medicare HMO

## 2020-12-17 ENCOUNTER — Observation Stay (HOSPITAL_COMMUNITY)
Admission: RE | Admit: 2020-12-17 | Discharge: 2020-12-18 | Disposition: A | Discharge status: Home / Self Care | Payer: Medicare HMO | Attending: Orthopaedic Surgery | Admitting: Orthopaedic Surgery

## 2020-12-17 ENCOUNTER — Encounter (HOSPITAL_COMMUNITY): Payer: Self-pay | Admitting: Orthopaedic Surgery

## 2020-12-17 ENCOUNTER — Encounter (HOSPITAL_COMMUNITY)
Admission: RE | Disposition: A | Discharge status: Home / Self Care | Payer: Self-pay | Source: Home / Self Care | Attending: Orthopaedic Surgery

## 2020-12-17 ENCOUNTER — Other Ambulatory Visit: Payer: Self-pay

## 2020-12-17 ENCOUNTER — Observation Stay (HOSPITAL_COMMUNITY): Payer: Medicare HMO

## 2020-12-17 DIAGNOSIS — I2510 Atherosclerotic heart disease of native coronary artery without angina pectoris: Secondary | ICD-10-CM | POA: Insufficient documentation

## 2020-12-17 DIAGNOSIS — D630 Anemia in neoplastic disease: Secondary | ICD-10-CM | POA: Diagnosis not present

## 2020-12-17 DIAGNOSIS — Z7982 Long term (current) use of aspirin: Secondary | ICD-10-CM | POA: Insufficient documentation

## 2020-12-17 DIAGNOSIS — Z96652 Presence of left artificial knee joint: Secondary | ICD-10-CM

## 2020-12-17 DIAGNOSIS — M1712 Unilateral primary osteoarthritis, left knee: Principal | ICD-10-CM | POA: Insufficient documentation

## 2020-12-17 DIAGNOSIS — Z85828 Personal history of other malignant neoplasm of skin: Secondary | ICD-10-CM | POA: Diagnosis not present

## 2020-12-17 DIAGNOSIS — I129 Hypertensive chronic kidney disease with stage 1 through stage 4 chronic kidney disease, or unspecified chronic kidney disease: Secondary | ICD-10-CM | POA: Insufficient documentation

## 2020-12-17 DIAGNOSIS — R52 Pain, unspecified: Secondary | ICD-10-CM

## 2020-12-17 DIAGNOSIS — Z79899 Other long term (current) drug therapy: Secondary | ICD-10-CM | POA: Diagnosis not present

## 2020-12-17 DIAGNOSIS — E1122 Type 2 diabetes mellitus with diabetic chronic kidney disease: Secondary | ICD-10-CM | POA: Insufficient documentation

## 2020-12-17 DIAGNOSIS — Z01818 Encounter for other preprocedural examination: Secondary | ICD-10-CM

## 2020-12-17 DIAGNOSIS — G8918 Other acute postprocedural pain: Secondary | ICD-10-CM | POA: Diagnosis not present

## 2020-12-17 DIAGNOSIS — N189 Chronic kidney disease, unspecified: Secondary | ICD-10-CM | POA: Insufficient documentation

## 2020-12-17 DIAGNOSIS — C4490 Unspecified malignant neoplasm of skin, unspecified: Secondary | ICD-10-CM | POA: Diagnosis not present

## 2020-12-17 HISTORY — DX: Presence of left artificial knee joint: Z96.652

## 2020-12-17 HISTORY — PX: TOTAL KNEE ARTHROPLASTY: SHX125

## 2020-12-17 LAB — HEMOGLOBIN A1C
Hgb A1c MFr Bld: 6.3 % — ABNORMAL HIGH (ref 4.8–5.6)
Mean Plasma Glucose: 134.11 mg/dL

## 2020-12-17 LAB — GLUCOSE, CAPILLARY
Glucose-Capillary: 146 mg/dL — ABNORMAL HIGH (ref 70–99)
Glucose-Capillary: 192 mg/dL — ABNORMAL HIGH (ref 70–99)
Glucose-Capillary: 168 mg/dL — ABNORMAL HIGH (ref 70–99)
Glucose-Capillary: 249 mg/dL — ABNORMAL HIGH (ref 70–99)
Glucose-Capillary: 167 mg/dL — ABNORMAL HIGH (ref 70–99)

## 2020-12-17 SURGERY — ARTHROPLASTY, KNEE, TOTAL
Anesthesia: General | Site: Knee | Laterality: Left

## 2020-12-17 MED ORDER — DEXAMETHASONE SODIUM PHOSPHATE 10 MG/ML IJ SOLN
INTRAMUSCULAR | Status: DC | PRN
  Administered 2020-12-17: 4 mg via INTRAVENOUS

## 2020-12-17 MED ORDER — INSULIN ASPART 100 UNIT/ML ~~LOC~~ SOLN: 0.0000 [IU] | Freq: Every day | SUBCUTANEOUS | Status: DC

## 2020-12-17 MED ORDER — SUGAMMADEX SODIUM 200 MG/2ML IV SOLN
INTRAVENOUS | Status: DC | PRN
  Administered 2020-12-17: 200 mg via INTRAVENOUS

## 2020-12-17 MED ORDER — ARTIFICIAL TEARS OPHTHALMIC OINT
TOPICAL_OINTMENT | OPHTHALMIC | Status: AC
  Filled 2020-12-17: qty 3.5

## 2020-12-17 MED ORDER — TRANEXAMIC ACID-NACL 1000-0.7 MG/100ML-% IV SOLN
1000.0000 mg | Freq: Once | INTRAVENOUS | Status: AC
Start: 2020-12-17 — End: 2020-12-17
  Administered 2020-12-17: 1000 mg via INTRAVENOUS
  Filled 2020-12-17: qty 100

## 2020-12-17 MED ORDER — FENTANYL CITRATE (PF) 100 MCG/2ML IJ SOLN
INTRAMUSCULAR | Status: AC
  Administered 2020-12-17: 50 ug via INTRAVENOUS
  Filled 2020-12-17: qty 2

## 2020-12-17 MED ORDER — ALUM & MAG HYDROXIDE-SIMETH 200-200-20 MG/5ML PO SUSP: 30.0000 mL | ORAL | Status: DC | PRN

## 2020-12-17 MED ORDER — DIPHENHYDRAMINE HCL 12.5 MG/5ML PO ELIX: 25.0000 mg | ORAL_SOLUTION | ORAL | Status: DC | PRN

## 2020-12-17 MED ORDER — HYDROMORPHONE HCL 1 MG/ML IJ SOLN: 0.5000 mg | INTRAMUSCULAR | Status: DC | PRN

## 2020-12-17 MED ORDER — ACETAMINOPHEN 500 MG PO TABS
1000.0000 mg | ORAL_TABLET | Freq: Four times a day (QID) | ORAL | Status: AC
  Administered 2020-12-17 – 2020-12-18 (×4): 1000 mg via ORAL
  Filled 2020-12-17 (×3): qty 2

## 2020-12-17 MED ORDER — ONDANSETRON HCL 4 MG/2ML IJ SOLN: 4.0000 mg | Freq: Four times a day (QID) | INTRAMUSCULAR | Status: DC | PRN

## 2020-12-17 MED ORDER — BUPIVACAINE-MELOXICAM ER 400-12 MG/14ML IJ SOLN
INTRAMUSCULAR | Status: AC
  Filled 2020-12-17: qty 1

## 2020-12-17 MED ORDER — PHENYLEPHRINE HCL-NACL 10-0.9 MG/250ML-% IV SOLN
INTRAVENOUS | Status: DC | PRN
  Administered 2020-12-17: 40 ug/min via INTRAVENOUS

## 2020-12-17 MED ORDER — ACETAMINOPHEN 500 MG PO TABS
1000.0000 mg | ORAL_TABLET | Freq: Once | ORAL | Status: AC
  Administered 2020-12-17: 1000 mg via ORAL
  Filled 2020-12-17: qty 2

## 2020-12-17 MED ORDER — IRRISEPT - 450ML BOTTLE WITH 0.05% CHG IN STERILE WATER, USP 99.95% OPTIME
TOPICAL | Status: DC | PRN
  Administered 2020-12-17: 450 mL via TOPICAL

## 2020-12-17 MED ORDER — VANCOMYCIN HCL 1000 MG IV SOLR
INTRAVENOUS | Status: DC | PRN
  Administered 2020-12-17: 1000 mg via TOPICAL

## 2020-12-17 MED ORDER — POVIDONE-IODINE 10 % EX SWAB
2.0000 "application " | Freq: Once | CUTANEOUS | Status: AC
  Administered 2020-12-17: 2 via TOPICAL

## 2020-12-17 MED ORDER — INSULIN ASPART 100 UNIT/ML ~~LOC~~ SOLN
0.0000 [IU] | Freq: Three times a day (TID) | SUBCUTANEOUS | Status: DC
  Administered 2020-12-17: 5 [IU] via SUBCUTANEOUS
  Administered 2020-12-18: 2 [IU] via SUBCUTANEOUS

## 2020-12-17 MED ORDER — METOCLOPRAMIDE HCL 5 MG/ML IJ SOLN: 5.0000 mg | Freq: Three times a day (TID) | INTRAMUSCULAR | Status: DC | PRN

## 2020-12-17 MED ORDER — VANCOMYCIN HCL 1000 MG IV SOLR
INTRAVENOUS | Status: AC
  Filled 2020-12-17: qty 1000

## 2020-12-17 MED ORDER — METOCLOPRAMIDE HCL 5 MG PO TABS: 5.0000 mg | ORAL_TABLET | Freq: Three times a day (TID) | ORAL | Status: DC | PRN

## 2020-12-17 MED ORDER — CLONIDINE HCL (ANALGESIA) 100 MCG/ML EP SOLN
EPIDURAL | Status: DC | PRN
  Administered 2020-12-17: 100 ug

## 2020-12-17 MED ORDER — ONDANSETRON HCL 4 MG/2ML IJ SOLN
INTRAMUSCULAR | Status: AC
  Filled 2020-12-17: qty 2

## 2020-12-17 MED ORDER — BUPIVACAINE-MELOXICAM ER 400-12 MG/14ML IJ SOLN
INTRAMUSCULAR | Status: DC | PRN
  Administered 2020-12-17: 400 mg

## 2020-12-17 MED ORDER — PANTOPRAZOLE SODIUM 40 MG PO TBEC
40.0000 mg | DELAYED_RELEASE_TABLET | Freq: Every day | ORAL | Status: DC
  Administered 2020-12-17 – 2020-12-18 (×2): 40 mg via ORAL
  Filled 2020-12-17 (×2): qty 1

## 2020-12-17 MED ORDER — FENTANYL CITRATE (PF) 100 MCG/2ML IJ SOLN
25.0000 ug | INTRAMUSCULAR | Status: DC | PRN
  Administered 2020-12-17 (×2): 25 ug via INTRAVENOUS

## 2020-12-17 MED ORDER — SODIUM CHLORIDE 0.9 % IR SOLN
Status: DC | PRN
  Administered 2020-12-17: 3000 mL

## 2020-12-17 MED ORDER — OXYCODONE HCL 5 MG PO TABS
5.0000 mg | ORAL_TABLET | ORAL | Status: DC | PRN
  Administered 2020-12-18: 5 mg via ORAL
  Filled 2020-12-17: qty 1

## 2020-12-17 MED ORDER — METHOCARBAMOL 500 MG PO TABS
500.0000 mg | ORAL_TABLET | Freq: Four times a day (QID) | ORAL | Status: DC | PRN
  Administered 2020-12-17: 500 mg via ORAL
  Filled 2020-12-17: qty 1

## 2020-12-17 MED ORDER — PROMETHAZINE HCL 25 MG/ML IJ SOLN: 6.2500 mg | INTRAMUSCULAR | Status: DC | PRN

## 2020-12-17 MED ORDER — CHLORHEXIDINE GLUCONATE 0.12 % MT SOLN
15.0000 mL | Freq: Once | OROMUCOSAL | Status: AC
  Administered 2020-12-17: 15 mL via OROMUCOSAL
  Filled 2020-12-17: qty 15

## 2020-12-17 MED ORDER — ASPIRIN 81 MG PO CHEW
81.0000 mg | CHEWABLE_TABLET | Freq: Two times a day (BID) | ORAL | Status: DC
  Administered 2020-12-17 – 2020-12-18 (×2): 81 mg via ORAL
  Filled 2020-12-17 (×2): qty 1

## 2020-12-17 MED ORDER — PHENOL 1.4 % MT LIQD: 1.0000 | OROMUCOSAL | Status: DC | PRN

## 2020-12-17 MED ORDER — CEFAZOLIN SODIUM-DEXTROSE 2-4 GM/100ML-% IV SOLN
2.0000 g | Freq: Four times a day (QID) | INTRAVENOUS | Status: AC
  Administered 2020-12-17 (×2): 2 g via INTRAVENOUS
  Filled 2020-12-17 (×2): qty 100

## 2020-12-17 MED ORDER — PROPOFOL 10 MG/ML IV BOLUS
INTRAVENOUS | Status: AC
  Filled 2020-12-17: qty 20

## 2020-12-17 MED ORDER — OXYCODONE HCL 5 MG PO TABS
ORAL_TABLET | ORAL | Status: AC
  Filled 2020-12-17: qty 1

## 2020-12-17 MED ORDER — CEFAZOLIN SODIUM-DEXTROSE 2-4 GM/100ML-% IV SOLN
2.0000 g | INTRAVENOUS | Status: AC
  Administered 2020-12-17: 2 g via INTRAVENOUS
  Filled 2020-12-17: qty 100

## 2020-12-17 MED ORDER — DEXAMETHASONE SODIUM PHOSPHATE 10 MG/ML IJ SOLN
INTRAMUSCULAR | Status: AC
  Filled 2020-12-17: qty 1

## 2020-12-17 MED ORDER — MENTHOL 3 MG MT LOZG: 1.0000 | LOZENGE | OROMUCOSAL | Status: DC | PRN

## 2020-12-17 MED ORDER — LACTATED RINGERS IV SOLN: INTRAVENOUS | Status: DC

## 2020-12-17 MED ORDER — SODIUM CHLORIDE 0.9 % IV SOLN: INTRAVENOUS | Status: DC

## 2020-12-17 MED ORDER — BUPIVACAINE-EPINEPHRINE (PF) 0.25% -1:200000 IJ SOLN
INTRAMUSCULAR | Status: AC
  Filled 2020-12-17: qty 30

## 2020-12-17 MED ORDER — FENTANYL CITRATE (PF) 100 MCG/2ML IJ SOLN
INTRAMUSCULAR | Status: DC | PRN
  Administered 2020-12-17 (×4): 50 ug via INTRAVENOUS

## 2020-12-17 MED ORDER — OXYCODONE HCL ER 10 MG PO T12A
10.0000 mg | EXTENDED_RELEASE_TABLET | Freq: Two times a day (BID) | ORAL | Status: DC
  Administered 2020-12-17 – 2020-12-18 (×3): 10 mg via ORAL
  Filled 2020-12-17 (×3): qty 1

## 2020-12-17 MED ORDER — PROPOFOL 10 MG/ML IV BOLUS
INTRAVENOUS | Status: DC | PRN
  Administered 2020-12-17: 30 mg via INTRAVENOUS
  Administered 2020-12-17: 140 mg via INTRAVENOUS

## 2020-12-17 MED ORDER — POLYETHYLENE GLYCOL 3350 17 G PO PACK
17.0000 g | PACK | Freq: Every day | ORAL | Status: DC
  Administered 2020-12-17: 17 g via ORAL
  Filled 2020-12-17: qty 1

## 2020-12-17 MED ORDER — EPHEDRINE SULFATE-NACL 50-0.9 MG/10ML-% IV SOSY
PREFILLED_SYRINGE | INTRAVENOUS | Status: DC | PRN
  Administered 2020-12-17: 10 mg via INTRAVENOUS

## 2020-12-17 MED ORDER — DOCUSATE SODIUM 100 MG PO CAPS
100.0000 mg | ORAL_CAPSULE | Freq: Two times a day (BID) | ORAL | Status: DC
  Administered 2020-12-17 (×2): 100 mg via ORAL
  Filled 2020-12-17 (×2): qty 1

## 2020-12-17 MED ORDER — ROCURONIUM BROMIDE 100 MG/10ML IV SOLN
INTRAVENOUS | Status: DC | PRN
  Administered 2020-12-17: 50 mg via INTRAVENOUS

## 2020-12-17 MED ORDER — FENTANYL CITRATE (PF) 250 MCG/5ML IJ SOLN
INTRAMUSCULAR | Status: AC
  Filled 2020-12-17: qty 5

## 2020-12-17 MED ORDER — OXYCODONE HCL 5 MG/5ML PO SOLN: 5.0000 mg | Freq: Once | ORAL | Status: DC | PRN

## 2020-12-17 MED ORDER — LIDOCAINE 2% (20 MG/ML) 5 ML SYRINGE
INTRAMUSCULAR | Status: DC | PRN
  Administered 2020-12-17: 100 mg via INTRAVENOUS

## 2020-12-17 MED ORDER — EPHEDRINE 5 MG/ML INJ
INTRAVENOUS | Status: AC
  Filled 2020-12-17: qty 10

## 2020-12-17 MED ORDER — ROCURONIUM BROMIDE 10 MG/ML (PF) SYRINGE
PREFILLED_SYRINGE | INTRAVENOUS | Status: AC
  Filled 2020-12-17: qty 10

## 2020-12-17 MED ORDER — TRANEXAMIC ACID-NACL 1000-0.7 MG/100ML-% IV SOLN
1000.0000 mg | INTRAVENOUS | Status: AC
  Administered 2020-12-17: 1000 mg via INTRAVENOUS
  Filled 2020-12-17: qty 100

## 2020-12-17 MED ORDER — SORBITOL 70 % SOLN
30.0000 mL | Freq: Every day | Status: DC | PRN
  Filled 2020-12-17: qty 30

## 2020-12-17 MED ORDER — BUPIVACAINE-EPINEPHRINE (PF) 0.25% -1:200000 IJ SOLN
INTRAMUSCULAR | Status: DC | PRN
  Administered 2020-12-17: 30 mL

## 2020-12-17 MED ORDER — ACETAMINOPHEN 325 MG PO TABS: 325.0000 mg | ORAL_TABLET | Freq: Four times a day (QID) | ORAL | Status: DC | PRN

## 2020-12-17 MED ORDER — MAGNESIUM CITRATE PO SOLN: 1.0000 | Freq: Once | ORAL | Status: DC | PRN

## 2020-12-17 MED ORDER — OXYCODONE HCL 5 MG PO TABS: 10.0000 mg | ORAL_TABLET | ORAL | Status: DC | PRN

## 2020-12-17 MED ORDER — METHOCARBAMOL 1000 MG/10ML IJ SOLN
500.0000 mg | Freq: Four times a day (QID) | INTRAVENOUS | Status: DC | PRN
  Filled 2020-12-17: qty 5

## 2020-12-17 MED ORDER — METHOCARBAMOL 500 MG PO TABS
ORAL_TABLET | ORAL | Status: AC
  Administered 2020-12-17: 500 mg via ORAL
  Filled 2020-12-17: qty 1

## 2020-12-17 MED ORDER — OXYCODONE HCL 5 MG PO TABS: 5.0000 mg | ORAL_TABLET | Freq: Once | ORAL | Status: DC | PRN

## 2020-12-17 MED ORDER — LIDOCAINE 2% (20 MG/ML) 5 ML SYRINGE
INTRAMUSCULAR | Status: AC
  Filled 2020-12-17: qty 5

## 2020-12-17 MED ORDER — ORAL CARE MOUTH RINSE: 15.0000 mL | Freq: Once | OROMUCOSAL | Status: AC

## 2020-12-17 MED ORDER — LABETALOL HCL 5 MG/ML IV SOLN
INTRAVENOUS | Status: DC | PRN
  Administered 2020-12-17: 5 mg via INTRAVENOUS

## 2020-12-17 MED ORDER — ONDANSETRON HCL 4 MG PO TABS: 4.0000 mg | ORAL_TABLET | Freq: Four times a day (QID) | ORAL | Status: DC | PRN

## 2020-12-17 MED ORDER — BUPIVACAINE-EPINEPHRINE (PF) 0.5% -1:200000 IJ SOLN
INTRAMUSCULAR | Status: DC | PRN
  Administered 2020-12-17: 15 mL via PERINEURAL

## 2020-12-17 MED ORDER — ONDANSETRON HCL 4 MG/2ML IJ SOLN
INTRAMUSCULAR | Status: DC | PRN
  Administered 2020-12-17: 4 mg via INTRAVENOUS

## 2020-12-17 SURGICAL SUPPLY — 80 items
ALCOHOL 70% 16 OZ (MISCELLANEOUS) ×2 IMPLANT
BAG DECANTER FOR FLEXI CONT (MISCELLANEOUS) ×2 IMPLANT
BANDAGE ESMARK 6X9 LF (GAUZE/BANDAGES/DRESSINGS) ×1 IMPLANT
BLADE SAG 18X100X1.27 (BLADE) ×2 IMPLANT
BNDG ESMARK 6X9 LF (GAUZE/BANDAGES/DRESSINGS) ×2
BOWL SMART MIX CTS (DISPOSABLE) ×2 IMPLANT
CEMENT BONE REFOBACIN R1X40 US (Cement) ×4 IMPLANT
CLSR STERI-STRIP ANTIMIC 1/2X4 (GAUZE/BANDAGES/DRESSINGS) ×4 IMPLANT
COOLER ICEMAN CLASSIC (MISCELLANEOUS) ×2 IMPLANT
COVER SURGICAL LIGHT HANDLE (MISCELLANEOUS) ×2 IMPLANT
COVER WAND RF STERILE (DRAPES) IMPLANT
CUFF TOURN SGL QUICK 34 (TOURNIQUET CUFF) ×2
CUFF TOURN SGL QUICK 42 (TOURNIQUET CUFF) IMPLANT
CUFF TRNQT CYL 34X4.125X (TOURNIQUET CUFF) ×1 IMPLANT
DERMABOND ADHESIVE PROPEN (GAUZE/BANDAGES/DRESSINGS) ×1
DERMABOND ADVANCED (GAUZE/BANDAGES/DRESSINGS) ×1
DERMABOND ADVANCED .7 DNX12 (GAUZE/BANDAGES/DRESSINGS) ×1 IMPLANT
DERMABOND ADVANCED .7 DNX6 (GAUZE/BANDAGES/DRESSINGS) ×1 IMPLANT
DRAPE EXTREMITY T 121X128X90 (DISPOSABLE) ×2 IMPLANT
DRAPE HALF SHEET 40X57 (DRAPES) ×2 IMPLANT
DRAPE INCISE IOBAN 66X45 STRL (DRAPES) IMPLANT
DRAPE ORTHO SPLIT 77X108 STRL (DRAPES) ×4
DRAPE POUCH INSTRU U-SHP 10X18 (DRAPES) ×2 IMPLANT
DRAPE SURG ORHT 6 SPLT 77X108 (DRAPES) ×2 IMPLANT
DRAPE U-SHAPE 47X51 STRL (DRAPES) ×4 IMPLANT
DRSG AQUACEL AG ADV 3.5X10 (GAUZE/BANDAGES/DRESSINGS) ×2 IMPLANT
DRSG AQUACEL AG ADV 3.5X14 (GAUZE/BANDAGES/DRESSINGS) ×2 IMPLANT
DURAPREP 26ML APPLICATOR (WOUND CARE) ×6 IMPLANT
ELECT CAUTERY BLADE 6.4 (BLADE) ×2 IMPLANT
ELECT REM PT RETURN 9FT ADLT (ELECTROSURGICAL) ×2
ELECTRODE REM PT RTRN 9FT ADLT (ELECTROSURGICAL) ×1 IMPLANT
GLOVE ECLIPSE 7.0 STRL STRAW (GLOVE) ×6 IMPLANT
GLOVE SKINSENSE NS SZ7.5 (GLOVE) ×3
GLOVE SKINSENSE STRL SZ7.5 (GLOVE) ×3 IMPLANT
GLOVE SURG SYN 7.5  E (GLOVE) ×8
GLOVE SURG SYN 7.5 E (GLOVE) ×4 IMPLANT
GLOVE SURG UNDER POLY LF SZ7 (GLOVE) ×2 IMPLANT
GOWN STRL REIN XL XLG (GOWN DISPOSABLE) ×2 IMPLANT
GOWN STRL REUS W/ TWL LRG LVL3 (GOWN DISPOSABLE) ×1 IMPLANT
GOWN STRL REUS W/TWL LRG LVL3 (GOWN DISPOSABLE) ×2
HANDPIECE INTERPULSE COAX TIP (DISPOSABLE) ×2
HDLS TROCR DRIL PIN KNEE 75 (PIN) ×2
HOOD PEEL AWAY FLYTE STAYCOOL (MISCELLANEOUS) ×6 IMPLANT
INSERT FIXED AS PERS SZ8-11 LT (Insert) ×2 IMPLANT
JET LAVAGE IRRISEPT WOUND (IRRIGATION / IRRIGATOR) ×2
KIT BASIN OR (CUSTOM PROCEDURE TRAY) ×2 IMPLANT
KIT TURNOVER KIT B (KITS) ×2 IMPLANT
LAVAGE JET IRRISEPT WOUND (IRRIGATION / IRRIGATOR) ×1 IMPLANT
MANIFOLD NEPTUNE II (INSTRUMENTS) ×2 IMPLANT
MARKER SKIN DUAL TIP RULER LAB (MISCELLANEOUS) ×2 IMPLANT
NEEDLE SPNL 18GX3.5 QUINCKE PK (NEEDLE) ×4 IMPLANT
NS IRRIG 1000ML POUR BTL (IV SOLUTION) ×2 IMPLANT
PACK TOTAL JOINT (CUSTOM PROCEDURE TRAY) ×2 IMPLANT
PAD ARMBOARD 7.5X6 YLW CONV (MISCELLANEOUS) ×4 IMPLANT
PAD COLD SHLDR WRAP-ON (PAD) ×2 IMPLANT
PIN DRILL HDLS TROCAR 75 4PK (PIN) ×1 IMPLANT
PSN FEM CR CMT CCR STD SZ10 L (Joint) ×2 IMPLANT
SAW OSC TIP CART 19.5X105X1.3 (SAW) ×2 IMPLANT
SCREW FEMALE HEX FIX 25X2.5 (ORTHOPEDIC DISPOSABLE SUPPLIES) ×2 IMPLANT
SET HNDPC FAN SPRY TIP SCT (DISPOSABLE) ×1 IMPLANT
STAPLER VISISTAT 35W (STAPLE) IMPLANT
STEM POLY PAT PLY 35M KNEE (Knees) ×2 IMPLANT
STEM TIBIA 5 DEG SZ G L KNEE (Knees) ×1 IMPLANT
SUCTION FRAZIER HANDLE 10FR (MISCELLANEOUS) ×2
SUCTION TUBE FRAZIER 10FR DISP (MISCELLANEOUS) ×1 IMPLANT
SURFACE ARTC PRSNA CCR SZ10 L (Joint) ×1 IMPLANT
SUT ETHILON 2 0 FS 18 (SUTURE) ×10 IMPLANT
SUT MNCRL AB 4-0 PS2 18 (SUTURE) IMPLANT
SUT VIC AB 0 CT1 27 (SUTURE) ×4
SUT VIC AB 0 CT1 27XBRD ANBCTR (SUTURE) ×2 IMPLANT
SUT VIC AB 1 CTX 27 (SUTURE) ×6 IMPLANT
SUT VIC AB 2-0 CT1 27 (SUTURE) ×8
SUT VIC AB 2-0 CT1 TAPERPNT 27 (SUTURE) ×4 IMPLANT
SYR 50ML LL SCALE MARK (SYRINGE) ×4 IMPLANT
TIBIA STEM 5 DEG SZ G L KNEE (Knees) ×2 IMPLANT
TOWEL GREEN STERILE (TOWEL DISPOSABLE) ×2 IMPLANT
TOWEL GREEN STERILE FF (TOWEL DISPOSABLE) ×2 IMPLANT
TRAY CATH 16FR W/PLASTIC CATH (SET/KITS/TRAYS/PACK) IMPLANT
UNDERPAD 30X36 HEAVY ABSORB (UNDERPADS AND DIAPERS) ×2 IMPLANT
WRAP KNEE MAXI GEL POST OP (GAUZE/BANDAGES/DRESSINGS) IMPLANT

## 2020-12-17 NOTE — Progress Notes (Signed)
Orthopedic Tech Progress Note Patient Details:  Kerry Matthews 15-Oct-1935 [id number]  CPM Left Knee CPM Left Knee: On Left Knee Flexion (Degrees): 0 Left Knee Extension (Degrees): 60  Post Interventions Patient Tolerated: Well Instructions Provided: Care of device  Janit Pagan 12/17/2020, 11:32 AM

## 2020-12-17 NOTE — Care Plan (Signed)
Ortho Bundle Case Management Note  Patient Details  Name: Kerry Matthews MRN: [id number] Date of Birth: [birth year]  RNCM reached out to patient prior to his Left total knee replacement with Dr. Erlinda Hong on 12/17/20. Patient is an Ortho bundle through [facility] and is agreeable to case management. He has a wife that will be assisting at home after discharge. He has a BSC/3in1. [employer] referral made for CPM and FWW. These were delivered prior to surgery. Post-op instructions mailed to patient today, which he should receive before surgery. Choice provided and referral made for [facility] ([facility]). Patient requested OPPT be in [place] and CM will assist with scheduling this appointment around 2 weeks post op. Will continue to follow for needs.                      DME Arranged:  CPM,Walker rolling DME Agency:  [employer]  HH Arranged:  PT [place] Agency:  [facility] ([facility])  Additional Comments: Please contact me with any questions of if this plan should need to change.  Jamse Arn, RN, BSN, [employer]  [phone] 12/17/2020, 3:40 PM

## 2020-12-17 NOTE — Anesthesia Procedure Notes (Signed)
Procedure Name: Intubation Date/Time: 12/17/2020 7:34 AM Performed by: Brennan Bailey, MD Pre-anesthesia Checklist: Patient identified, Emergency Drugs available, Suction available and Patient being monitored Patient Re-evaluated:Patient Re-evaluated prior to induction Oxygen Delivery Method: Circle System Utilized Preoxygenation: Pre-oxygenation with 100% oxygen Induction Type: IV induction Ventilation: Mask ventilation without difficulty Laryngoscope Size: Miller and 3 Grade View: Grade I Tube type: Oral Tube size: 7.5 mm Number of attempts: 2 Airway Equipment and Method: Stylet Placement Confirmation: ETT inserted through vocal cords under direct vision,  positive ETCO2 and breath sounds checked- equal and bilateral Secured at: 24 cm Tube secured with: Tape Dental Injury: Teeth and Oropharynx as per pre-operative assessment  Comments: First attempt by CRNA with Mac 3 blade unable to view glottis. Second attempt by myself with Sabra Heck 3 blade, grade 1 view. Somewhat deep glottic structures. Atraumatic intubation. Daiva Huge, MD

## 2020-12-17 NOTE — Op Note (Signed)
Total Knee Arthroplasty Procedure Note  Preoperative diagnosis: Left knee osteoarthritis, unstable OCD lesion medial femoral condyle  Postoperative diagnosis:same  Operative procedure: Left total knee arthroplasty. CPT [phone]  Surgeon: N. Eduard Roux, MD  Assist: Madalyn Rob, PA-C; necessary for the timely completion of procedure and due to complexity of procedure.  Anesthesia: Spinal, regional, local  Tourniquet time: see anesthesia record  Implants used: Zimmer persona Femur: CR 10 Tibia: G Patella: 35 mm Polyethylene: 12 mm, MC  Indication: Kerry Matthews is a 85 y.o. year old male with a history of knee pain. Having failed conservative management, the patient elected to proceed with a total knee arthroplasty.  We have reviewed the risk and benefits of the surgery and they elected to proceed after voicing understanding.  Procedure:  After informed consent was obtained and understanding of the risk were voiced including but not limited to bleeding, infection, damage to surrounding structures including nerves and vessels, blood clots, leg length inequality and the failure to achieve desired results, the operative extremity was marked with verbal confirmation of the patient in the holding area.   The patient was then brought to the operating room and transported to the operating room table in the supine position.  A tourniquet was applied to the operative extremity around the upper thigh. The operative limb was then prepped and draped in the usual sterile fashion and preoperative antibiotics were administered.  A time out was performed prior to the start of surgery confirming the correct extremity, preoperative antibiotic administration, as well as team members, implants and instruments available for the case. Correct surgical site was also confirmed with preoperative radiographs. The limb was then elevated for exsanguination and the tourniquet was inflated. A midline  incision was made and a standard medial parapatellar approach was performed.  The infrapatellar fat pad was removed.  Suprapatellar synovium was removed to reveal the anterior distal femoral cortex.  A medial peel was performed to release the capsule of the medial tibial plateau.  The patella was then everted which showed severe degenerative wear and was prepared and sized to a 35 mm.  A cover was placed on the patella for protection from retractors.  The knee was then brought into full flexion and we then turned our attention to the femur.  The medial femoral condyle had a large 2.5 x 2 cm unstable OCD lesion in the weight bearing surface.  The cruciates were sacrificed.  Start site was drilled in the femur and the intramedullary distal femoral cutting guide was placed, set at 5 degrees valgus, taking 10 mm of distal resection. The distal cut was made. Osteophytes were then removed.  Next, the proximal tibial cutting guide was placed with appropriate slope, varus/valgus alignment and depth of resection. The proximal tibial cut was made. Gap blocks were then used to assess the extension gap and alignment, and appropriate soft tissue releases were performed. Attention was turned back to the femur, which was sized using the sizing guide to a size 10. Appropriate rotation of the femoral component was determined using epicondylar axis, Whiteside's line, and assessing the flexion gap under ligament tension. The appropriate size 4-in-1 cutting block was placed and checked with an angel wing and cuts were made. Posterior femoral osteophytes and uncapped bone were then removed with the curved osteotome.  Trial components were placed, and stability was checked in full extension, mid-flexion, and deep flexion. Proper tibial rotation was determined and marked.  The patella tracked well without a lateral release.  The femoral lugs were then drilled. Trial components were then removed and tibial preparation performed.  The  tibia was sized for a size G component.   The bony surfaces were irrigated with a pulse lavage and then dried. Bone cement was vacuum mixed on the back table, and the final components sized above were cemented into place.  Antibiotic irrigation was placed in the knee joint and soft tissues while the cement cured.  After cement had finished curing, excess cement was removed. The stability of the construct was re-evaluated throughout a range of motion and found to be acceptable. The trial liner was removed, the knee was copiously irrigated, and the knee was re-evaluated for any excess bone debris. The real polyethylene liner, 12 mm thick, was inserted and checked to ensure the locking mechanism had engaged appropriately. The tourniquet was deflated and hemostasis was achieved. The wound was irrigated with normal saline.  One gram of vancomycin powder was placed in the surgical bed.  Capsular closure was performed with a #1 vicryl, subcutaneous fat closed with a 0 vicryl suture, then subcutaneous tissue closed with interrupted 2.0 vicryl suture. The skin was then closed with a 2.0 nylon and dermabond. A sterile dressing was applied.  The patient was awakened in the operating room and taken to recovery in stable condition. All sponge, needle, and instrument counts were correct at the end of the case.  Tawanna Cooler was necessary for opening, closing, retracting, limb positioning and overall facilitation and completion of the surgery.  Position: supine  Complications: none.  Time Out: performed   Drains/Packing: none  Estimated blood loss: minimal  Returned to Recovery Room: in good condition.   Antibiotics: yes   Mechanical VTE (DVT) Prophylaxis: sequential compression devices, TED thigh-high  Chemical VTE (DVT) Prophylaxis: aspirin  Fluid Replacement  Crystalloid: see anesthesia record Blood: none  FFP: none   Specimens Removed: 1 to pathology   Sponge and Instrument Count Correct? yes    PACU: portable radiograph - knee AP and Lateral   Plan/RTC: Return in 2 weeks for wound check.   Weight Bearing/Load Lower Extremity: full   Implant Name Type Inv. Item Serial No. Manufacturer Lot No. LRB No. Used Action  CEMENT BONE REFOBACIN [id number] [place] - [id number] Cement CEMENT BONE REFOBACIN [id number] [place]  ZIMMER RECON(ORTH,TRAU,BIO,SG)  Left 2 Implanted  TIBIA STEMMED SIZE G    ZIMMER KNEE [id number] Left 1 Implanted  PSN FEM CR CMT CCR STD SZ10 L - [id number] Joint PSN FEM CR CMT CCR STD SZ10 L  ZIMMER RECON(ORTH,TRAU,BIO,SG) [id number] Left 1 Implanted  POLYETHYLENE LEFT 12    ZIMMER KNEE [id number] Left 1 Implanted  STEM POLY PAT PLY 44M KNEE - [id number] Knees STEM POLY PAT PLY 44M KNEE  ZIMMER RECON(ORTH,TRAU,BIO,SG) [id number] Left 1 Implanted    N. Eduard Roux, MD [facility] 9:06 AM

## 2020-12-17 NOTE — Transfer of Care (Signed)
Immediate Anesthesia Transfer of Care Note  Patient: Kerry Matthews  Procedure(s) Performed: LEFT TOTAL KNEE ARTHROPLASTY (Left Knee)  Patient Location: PACU  Anesthesia Type:GA combined with regional for post-op pain  Level of Consciousness: awake, alert  and oriented  Airway & Oxygen Therapy: Patient Spontanous Breathing and Patient connected to face mask oxygen  Post-op Assessment: Report given to RN, Post -op Vital signs reviewed and stable and Patient moving all extremities  Post vital signs: Reviewed and stable  Last Vitals:  Vitals Value Taken Time  BP 181/72 12/17/20 0948  Temp    Pulse 46 12/17/20 0951  Resp 13 12/17/20 0951  SpO2 100 % 12/17/20 0951  Vitals shown include unvalidated device data.  Last Pain:  Vitals:   12/17/20 0602  PainSc: 0-No pain         Complications: No complications documented.

## 2020-12-17 NOTE — Anesthesia Procedure Notes (Signed)
Anesthesia Regional Block: Adductor canal block   Pre-Anesthetic Checklist: ,, timeout performed, Correct Patient, Correct Site, Correct Laterality, Correct Procedure, Correct Position, site marked, Risks and benefits discussed, pre-op evaluation,  At surgeon's request and post-op pain management  Laterality: Left  Prep: Maximum Sterile Barrier Precautions used, chloraprep       Needles:  Injection technique: Single-shot  Needle Type: Echogenic Stimulator Needle     Needle Length: 9cm  Needle Gauge: 22     Additional Needles:   Procedures:,,,, ultrasound used (permanent image in chart),,,,  Narrative:  Start time: 12/17/2020 6:53 AM End time: 12/17/2020 6:55 AM Injection made incrementally with aspirations every 5 mL.  Performed by: Personally  Anesthesiologist: Brennan Bailey, MD  Additional Notes: Risks, benefits, and alternative discussed. Patient gave consent for procedure. Patient prepped and draped in sterile fashion. Sedation administered, patient remains easily responsive to voice. Relevant anatomy identified with ultrasound guidance. Local anesthetic given in 5cc increments with no signs or symptoms of intravascular injection. No pain or paraesthesias with injection. Patient monitored throughout procedure with signs of LAST or immediate complications. Tolerated well. Ultrasound image placed in chart.  Tawny Asal, MD

## 2020-12-17 NOTE — H&P (Signed)
PREOPERATIVE H&P  Chief Complaint: left knee degenerative joint disease  HPI: Kerry Matthews is a 85 y.o. male who presents for surgical treatment of left knee degenerative joint disease.  He denies any changes in medical history.  Past Medical History:  Diagnosis Date  . 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 Abnormal CT of the chest   . Abnormal findings on diagnostic imaging of lung 04/12/2019   04/01/2019-CT chest with contrast- no acute process in chest abdomen or pelvis, interstitial lung disease suspicious for early or mild UIP, pulmonary artery enlargement suggest PAH   . Acute low back pain without sciatica 03/08/2020  . Acute lower UTI 07/07/2020  . Acute pain of left knee 11/30/2019  . AKI (acute kidney injury) ([place])   . Alpha-1-antitrypsin deficiency carrier 05/04/2019  . Anemia   . Atherosclerotic heart disease of native coronary artery without angina pectoris 05/03/2018  . Bradycardia   . CKD (chronic kidney disease) 05/03/2018  . Community acquired pneumonia   . Coronary artery disease   . Cough   . Diabetes ([place])   . Diabetes mellitus type 2 in obese ([place]) 04/01/2019  . Dyslipidemia associated with type 2 diabetes mellitus ([place]) 05/03/2018  . Elevated LFTs 04/01/2019  . Elevated rheumatoid factor 04/12/2019   04/03/2019-rheumatoid factor-95.4  . Emphysema (subcutaneous) (surgical) resulting from a procedure 05/03/2018  . Essential hypertension 05/03/2018  . Fever   . GAD (generalized anxiety disorder)   . GERD (gastroesophageal reflux disease)   . History of alpha-1-antitrypsin deficiency 04/12/2019  . History of kidney stones   . Hyperlipidemia   . Hypertension   . Hypoxemia   . Insomnia 05/03/2018  . Interstitial pulmonary disease ([place]) 04/12/2019   04/01/2019-CT chest with  contrast- no acute process in chest abdomen or pelvis, interstitial lung disease suspicious for early or mild UIP, pulmonary artery enlargement suggest PAH  04/02/2019-connective tissue work-up: Anti-Jo 1-negative Anti-DNA antibody double-stranded-negative Anti-scleroderma antibody-negative Sjogren's syndrome antibody-negative Sjogren's syndrome antibody-negative CK-31 CCP-6 E  . Leukocytosis 03/08/2020  . Low vitamin B12 level 04/03/2019  . Lower extremity edema   . Medicare annual wellness visit, subsequent 08/09/2020  . Mixed hyperlipidemia 05/03/2018  . Myalgia 04/19/2019  . Osteoarthritis   . Other emphysema ([place])   . Paresthesias in left hand 11/30/2019  . Pneumonia 04/01/2019  . Pneumonitis   . Polymyositis ([place])   . Primary insomnia   . Renal stones   . Sepsis ([place]) 04/19/2019  . Severe sepsis ([place]) 07/07/2020  . Skin cancer   . Transaminitis   . Trigger finger 05/03/2018  . UC (ulcerative colitis) ([place])   . UTI (urinary tract infection)    Past Surgical History:  Procedure Laterality Date  . ANGIOPLASTY    . BACK SURGERY    . CATARACT EXTRACTION    . COLONOSCOPY  06/16/2005   Mild colitis involving splenic flexure. Colonic polyps, status post polypectomy. Mild pancolonic diverticulitits. Internal hemorrhoids.   . ESOPHAGOGASTRODUODENOSCOPY  04/26/2003   Irregular Z line suggestive of GERD. Mild gastritis status post CLO testing.   Marland Kitchen TRIGGER FINGER RELEASE     Social History   Socioeconomic History  . Marital status: Married    Spouse name: Not on file  . Number of children: 2  . Years of education: Not on file  .  Highest education level: Not on file  Occupational History  . Occupation: retired  Tobacco Use  . Smoking status: Never Smoker  . Smokeless tobacco: Never Used  Vaping Use  . Vaping Use: Never used  Substance and Sexual Activity  . Alcohol use: Never  . Drug use: Never  . Sexual activity: Not on file  Other Topics Concern  . Not on file  Social History  Narrative  . Not on file   Social Determinants of Health   Financial Resource Strain: Not on file  Food Insecurity: No Food Insecurity  . Worried About Charity fundraiser in the Last Year: Never true  . Ran Out of Food in the Last Year: Never true  Transportation Needs: No Transportation Needs  . Lack of Transportation (Medical): No  . Lack of Transportation (Non-Medical): No  Physical Activity: Sufficiently Active  . Days of Exercise per Week: 5 days  . Minutes of Exercise per Session: 30 min  Stress: Not on file  Social Connections: Not on file   Family History  Problem Relation Age of Onset  . Tuberculosis Mother   . Stroke Father   . Pancreatic cancer Sister   . Heart attack Sister   . Lung disease Sister   . Clotting disorder Brother   . Colon cancer Neg Hx   . Esophageal cancer Neg Hx    Allergies  Allergen Reactions  . Ace Inhibitors Other (See Comments)    Slow heart rate  . Hydrocodone Nausea And Vomiting  . Hydrocodone-Acetaminophen Nausea And Vomiting  . Nsaids     Kidney issues  . Sulfamethoxazole-Trimethoprim Nausea And Vomiting   Prior to Admission medications   Medication Sig Start Date End Date Taking? Authorizing Provider  aspirin EC 81 MG tablet Take 1 tablet (81 mg total) by mouth 2 (two) times daily. To be taken after surgery Patient taking differently: Take 81 mg by mouth daily. 11/07/20  Yes Aundra Dubin, PA-C  furosemide (LASIX) 20 MG tablet Take 20 mg by mouth daily as needed for fluid.   Yes [provider]  gabapentin (NEURONTIN) 400 MG capsule TAKE 1 CAPSULE THREE TIMES DAILY Patient taking differently: Take 400 mg by mouth 2 (two) times daily. 09/26/20  Yes Cox, Kirsten, MD  Multiple Vitamins-Minerals (PRESERVISION AREDS 2) CAPS Take 1 capsule by mouth 2 (two) times daily after a meal.   Yes [provider]  nitroGLYCERIN (NITROSTAT) 0.4 MG SL tablet Place 1 tablet (0.4 mg total) under the tongue every 5 (five) minutes as  needed for chest pain. 09/06/20  Yes Revankar, Reita Cliche, MD  Omega-3 Fatty Acids (FISH OIL) 1000 MG CAPS Take 1,000 mg by mouth 2 (two) times daily.    Yes [provider]  omeprazole (PRILOSEC) 20 MG capsule TAKE 1 CAPSULE EVERY DAY Patient taking differently: Take 20 mg by mouth daily. 10/02/20  Yes Cox, Kirsten, MD  riTUXimab (RITUXAN) 100 MG/10ML injection Inject 100 mg into the vein every 6 (six) months.   Yes [provider]  vitamin B-12 (CYANOCOBALAMIN) 1000 MCG tablet Take 1 tablet (1,000 mcg total) by mouth daily. Patient taking differently: Take 1,000 mcg by mouth daily before breakfast. 04/06/19  Yes Vann, Jessica U, DO  atorvastatin (LIPITOR) 20 MG tablet TAKE 1 TABLET (20 MG TOTAL) BY MOUTH AT BEDTIME. 10/02/20   [name], MD  Blood Glucose Monitoring Suppl (TRUE METRIX METER) DEVI E11.69 Use as directed 10/03/20   [name], MD  cephALEXin [facility]) 500  MG capsule Take 1 capsule (500 mg total) by mouth 4 (four) times daily. To be taken after surgery 11/07/20   Aundra Dubin, PA-C  docusate sodium (COLACE) 100 MG capsule Take 1 capsule (100 mg total) by mouth daily as needed. Patient not taking: No sig reported 11/07/20 11/07/21  Aundra Dubin, PA-C  glucose blood (TRUE METRIX BLOOD GLUCOSE TEST) test strip E11.69 TEST BLOOD SUGAR THREE TIMES DAILY BEFORE MEALS 10/03/20   Cox, Kirsten, MD  methocarbamol (ROBAXIN) 500 MG tablet Take 1 tablet (500 mg total) by mouth 2 (two) times daily as needed. To be taken after surgery 11/07/20   Aundra Dubin, PA-C  ondansetron (ZOFRAN) 4 MG tablet Take 1 tablet (4 mg total) by mouth every 8 (eight) hours as needed for nausea or vomiting. 11/07/20   Aundra Dubin, PA-C  oxyCODONE-acetaminophen (PERCOCET) 5-325 MG tablet Take 1-2 tablets by mouth every 6 (six) hours as needed. To be taken after surgery 11/07/20   Aundra Dubin, PA-C     Positive ROS: All other systems have been reviewed and were otherwise negative with the  exception of those mentioned in the HPI and as above.  Physical Exam: General: Alert, no acute distress Cardiovascular: No pedal edema Respiratory: No cyanosis, no use of accessory musculature GI: abdomen soft Skin: No lesions in the area of chief complaint Neurologic: Sensation intact distally Psychiatric: Patient is competent for consent with normal mood and affect Lymphatic: no lymphedema  MUSCULOSKELETAL: exam stable  Assessment: left knee degenerative joint disease  Plan: Plan for Procedure(s): LEFT TOTAL KNEE ARTHROPLASTY  The risks benefits and alternatives were discussed with the patient including but not limited to the risks of nonoperative treatment, versus surgical intervention including infection, bleeding, nerve injury,  blood clots, cardiopulmonary complications, morbidity, mortality, among others, and they were willing to proceed.   Preoperative templating of the joint replacement has been completed, documented, and submitted to the Operating Room personnel in order to optimize intra-operative equipment management.   Eduard Roux, MD 12/17/2020 6:00 AM

## 2020-12-17 NOTE — Discharge Instructions (Signed)

## 2020-12-17 NOTE — Anesthesia Postprocedure Evaluation (Signed)
Anesthesia Post Note  Patient: Kerry Matthews  Procedure(s) Performed: LEFT TOTAL KNEE ARTHROPLASTY (Left Knee)     Patient location during evaluation: PACU Anesthesia Type: General Level of consciousness: awake and alert and oriented Pain management: pain level controlled Vital Signs Assessment: post-procedure vital signs reviewed and stable Respiratory status: spontaneous breathing, nonlabored ventilation and respiratory function stable Cardiovascular status: blood pressure returned to baseline Postop Assessment: no apparent nausea or vomiting Anesthetic complications: no   No complications documented.  Last Vitals:  Vitals:   12/17/20 1050 12/17/20 1104  BP: (!) 143/59 (!) 135/57  Pulse: (!) 31 84  Resp: (!) 27 (!) 28  Temp:  (!) 36.1 C  SpO2: 99% 99%                Brennan Bailey

## 2020-12-17 NOTE — Evaluation (Signed)
Physical Therapy Evaluation Patient Details Name: Kerry Matthews MRN: [id number] DOB: 1935-11-18 Today's Date: 12/17/2020   History of Present Illness  Pt is 85 yo male s/p L TKA on 12/17/20.  He has PMH of arthritis, CAD, DM, HTN, back surgery.  Clinical Impression  Pt is s/p TKA resulting in the deficits listed below (see PT Problem List). Pt making excellent progress for DOS.  He had excellent ROM (~0 to 80 degrees), no pain, and ambulated 80' with RW and min guard. Provided cues for safe technique and educated on plan of care.  Pt with excellent rehab potential, has support at home and necessary DME.  Pt will benefit from skilled PT to increase their independence and safety with mobility to allow discharge to the venue listed below.      Follow Up Recommendations Follow surgeon's recommendation for DC plan and follow-up therapies;Supervision for mobility/OOB    Equipment Recommendations  None recommended by PT (has DME)    Recommendations for Other Services       Precautions / Restrictions Precautions Precautions: Fall Restrictions Weight Bearing Restrictions: Yes LLE Weight Bearing: Weight bearing as tolerated      Mobility  Bed Mobility Overal bed mobility: Needs Assistance Bed Mobility: Supine to Sit;Sit to Supine     Supine to sit: Supervision;HOB elevated Sit to supine: Supervision;HOB elevated        Transfers Overall transfer level: Needs assistance Equipment used: Rolling walker (2 wheeled) Transfers: Sit to/from Stand Sit to Stand: Min guard         General transfer comment: cues for safe hand placement and L LE management  Ambulation/Gait Ambulation/Gait assistance: Min guard Gait Distance (Feet): 80 Feet Assistive device: Rolling walker (2 wheeled) Gait Pattern/deviations: Step-to pattern;Decreased stride length;Decreased weight shift to left Gait velocity: decreased   General Gait Details: Cues for RW use and sequencing; Min guard for  safety  Stairs            Wheelchair Mobility    Modified Rankin (Stroke Patients Only)       Balance Overall balance assessment: Needs assistance Sitting-balance support: No upper extremity supported Sitting balance-Leahy Scale: Good     Standing balance support: Bilateral upper extremity supported Standing balance-Leahy Scale: Poor Standing balance comment: utilized RW                             Pertinent Vitals/Pain Pain Assessment: 0-10 Pain Score: 5  Pain Location: L knee Pain Descriptors / Indicators: Discomfort Pain Intervention(s): Limited activity within patient's tolerance;Monitored during session;Repositioned    Home Living Family/patient expects to be discharged to:: Private residence Living Arrangements: Spouse/significant other Available Help at Discharge: Family;Available 24 hours/day Type of Home: House Home Access: Stairs to enter Entrance Stairs-Rails: Left Entrance Stairs-Number of Steps: 2 Home Layout: One level Home Equipment: Grab bars - tub/shower;Walker - 2 wheels;Cane - single point      Prior Function Level of Independence: Independent         Comments: Very independent: working in yard, walking, driving     Journalist, newspaper   Dominant Hand: Right    Extremity/Trunk Assessment   Upper Extremity Assessment Upper Extremity Assessment: Overall WFL for tasks assessed    Lower Extremity Assessment Lower Extremity Assessment: LLE deficits/detail LLE Deficits / Details: Expected post op changes; ROM: ankle and hip WFL, knee 0 to 80 degrees; MMT: 3/5 hip and knee, 5/5 ankle LLE Sensation: WNL    Cervical /  Trunk Assessment Cervical / Trunk Assessment: Normal  Communication   Communication: No difficulties  Cognition Arousal/Alertness: Awake/alert Behavior During Therapy: WFL for tasks assessed/performed Overall Cognitive Status: Within Functional Limits for tasks assessed                                  General Comments: HOH      General Comments General comments (skin integrity, edema, etc.): Educated on PT role, POC, resting with leg straight, and safe ice use.  Encouraged to do ankle pumps, quad sets, and gentle heel slides tonight.  Pt asking how much to wear CPM - instructed to refer to MD protocol but is typically 2 hr 3-4x day    Exercises Total Joint Exercises Ankle Circles/Pumps: AROM;Both;10 reps;Supine Quad Sets: AROM;Supine;Both;10 reps Heel Slides: AROM;Left;5 reps;Supine   Assessment/Plan    PT Assessment Patient needs continued PT services  PT Problem List Decreased strength;Decreased mobility;Decreased safety awareness;Decreased range of motion;Decreased knowledge of precautions;Decreased activity tolerance;Decreased balance;Decreased knowledge of use of DME;Pain       PT Treatment Interventions DME instruction;Therapeutic activities;Modalities;Gait training;Therapeutic exercise;Patient/family education;Stair training;Balance training;Functional mobility training    PT Goals (Current goals can be found in the Care Plan section)  Acute Rehab PT Goals Patient Stated Goal: return home PT Goal Formulation: With patient/family Time For Goal Achievement: 12/31/20 Potential to Achieve Goals: Good    Frequency 7X/week   Barriers to discharge        Co-evaluation               AM-PAC PT "6 Clicks" Mobility  Outcome Measure Help needed turning from your back to your side while in a flat bed without using bedrails?: None   Help needed moving to and from a bed to a chair (including a wheelchair)?: A Little Help needed standing up from a chair using your arms (e.g., wheelchair or bedside chair)?: A Little Help needed to walk in hospital room?: A Little Help needed climbing 3-5 steps with a railing? : A Little 6 Click Score: 16    End of Session Equipment Utilized During Treatment: Gait belt Activity Tolerance: Patient tolerated treatment well Patient  left: in bed;with call bell/phone within reach;with bed alarm set;with SCD's reapplied;with family/visitor present Nurse Communication: Mobility status PT Visit Diagnosis: Other abnormalities of gait and mobility (R26.89);Muscle weakness (generalized) (M62.81);Pain Pain - Right/Left: Left Pain - part of body: Knee    Time: 1540-1616 PT Time Calculation (min) (ACUTE ONLY): 36 min   Charges:   PT Evaluation $PT Eval Low Complexity: 1 Low PT Treatments $Gait Training: 8-22 mins        Abran Richard, PT Acute Rehab Services Pager [phone] [facility] [phone]    Karlton Lemon 12/17/2020, 4:26 PM

## 2020-12-18 ENCOUNTER — Encounter (HOSPITAL_COMMUNITY): Payer: Self-pay | Admitting: Orthopaedic Surgery

## 2020-12-18 ENCOUNTER — Telehealth: Payer: Self-pay | Admitting: *Deleted

## 2020-12-18 DIAGNOSIS — Z7982 Long term (current) use of aspirin: Secondary | ICD-10-CM | POA: Diagnosis not present

## 2020-12-18 DIAGNOSIS — Z85828 Personal history of other malignant neoplasm of skin: Secondary | ICD-10-CM | POA: Diagnosis not present

## 2020-12-18 DIAGNOSIS — Z79899 Other long term (current) drug therapy: Secondary | ICD-10-CM | POA: Diagnosis not present

## 2020-12-18 DIAGNOSIS — I2510 Atherosclerotic heart disease of native coronary artery without angina pectoris: Secondary | ICD-10-CM | POA: Diagnosis not present

## 2020-12-18 DIAGNOSIS — E1122 Type 2 diabetes mellitus with diabetic chronic kidney disease: Secondary | ICD-10-CM | POA: Diagnosis not present

## 2020-12-18 DIAGNOSIS — M1712 Unilateral primary osteoarthritis, left knee: Secondary | ICD-10-CM | POA: Diagnosis not present

## 2020-12-18 DIAGNOSIS — N189 Chronic kidney disease, unspecified: Secondary | ICD-10-CM | POA: Diagnosis not present

## 2020-12-18 DIAGNOSIS — I129 Hypertensive chronic kidney disease with stage 1 through stage 4 chronic kidney disease, or unspecified chronic kidney disease: Secondary | ICD-10-CM | POA: Diagnosis not present

## 2020-12-18 DIAGNOSIS — Z96652 Presence of left artificial knee joint: Secondary | ICD-10-CM | POA: Diagnosis not present

## 2020-12-18 LAB — BASIC METABOLIC PANEL
Anion gap: 6 (ref 5–15)
BUN: 13 mg/dL (ref 8–23)
CO2: 27 mmol/L (ref 22–32)
Calcium: 8.3 mg/dL — ABNORMAL LOW (ref 8.9–10.3)
Chloride: 100 mmol/L (ref 98–111)
Creatinine, Ser: 1.35 mg/dL — ABNORMAL HIGH (ref 0.61–1.24)
GFR, Estimated: 52 mL/min — ABNORMAL LOW (ref >60)
Glucose, Bld: 154 mg/dL — ABNORMAL HIGH (ref 70–99)
Potassium: 4.2 mmol/L (ref 3.5–5.1)
Sodium: 133 mmol/L — ABNORMAL LOW (ref 135–145)

## 2020-12-18 LAB — CBC
HCT: 34.3 % — ABNORMAL LOW (ref 39.0–52.0)
Hemoglobin: 11.5 g/dL — ABNORMAL LOW (ref 13.0–17.0)
MCH: 28.9 pg (ref 26.0–34.0)
MCHC: 33.5 g/dL (ref 30.0–36.0)
MCV: 86.2 fL (ref 80.0–100.0)
Platelets: 270 10*3/uL (ref 150–400)
RBC: 3.98 MIL/uL — ABNORMAL LOW (ref 4.22–5.81)
RDW: 12.8 % (ref 11.5–15.5)
WBC: 13.7 10*3/uL — ABNORMAL HIGH (ref 4.0–10.5)
nRBC: 0 % (ref 0.0–0.2)

## 2020-12-18 LAB — GLUCOSE, CAPILLARY: Glucose-Capillary: 132 mg/dL — ABNORMAL HIGH (ref 70–99)

## 2020-12-18 NOTE — Discharge Summary (Signed)
Patient ID: Kerry Matthews MRN: [id number] DOB/AGE: November 02, 1935 85 y.o.  Admit date: 12/17/2020 Discharge date: 12/18/2020  Admission Diagnoses:  Active Problems:   Status post total left knee replacement   Discharge Diagnoses:  Same  Past Medical History:  Diagnosis Date  . 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 Abnormal CT of the chest   . Abnormal findings on diagnostic imaging of lung 04/12/2019   04/01/2019-CT chest with contrast- no acute process in chest abdomen or pelvis, interstitial lung disease suspicious for early or mild UIP, pulmonary artery enlargement suggest PAH   . Acute low back pain without sciatica 03/08/2020  . Acute lower UTI 07/07/2020  . Acute pain of left knee 11/30/2019  . AKI (acute kidney injury) ([place])   . Alpha-1-antitrypsin deficiency carrier 05/04/2019  . Anemia   . Atherosclerotic heart disease of native coronary artery without angina pectoris 05/03/2018  . Bradycardia   . CKD (chronic kidney disease) 05/03/2018  . Community acquired pneumonia   . Coronary artery disease   . Cough   . Diabetes ([place])   . Diabetes mellitus type 2 in obese ([place]) 04/01/2019  . Dyslipidemia associated with type 2 diabetes mellitus ([place]) 05/03/2018  . Elevated LFTs 04/01/2019  . Elevated rheumatoid factor 04/12/2019   04/03/2019-rheumatoid factor-95.4  . Emphysema (subcutaneous) (surgical) resulting from a procedure 05/03/2018  . Essential hypertension 05/03/2018  . Fever   . GAD (generalized anxiety disorder)   . GERD (gastroesophageal reflux disease)   . History of alpha-1-antitrypsin deficiency 04/12/2019  . History of kidney stones   . Hyperlipidemia   . Hypertension   . Hypoxemia   . Insomnia 05/03/2018  . Interstitial pulmonary disease ([place]) 04/12/2019   04/01/2019-CT chest with  contrast- no acute process in chest abdomen or pelvis, interstitial lung disease suspicious for early or mild UIP, pulmonary artery enlargement suggest PAH  04/02/2019-connective tissue work-up: Anti-Jo 1-negative Anti-DNA antibody double-stranded-negative Anti-scleroderma antibody-negative Sjogren's syndrome antibody-negative Sjogren's syndrome antibody-negative CK-31 CCP-6 E  . Leukocytosis 03/08/2020  . Low vitamin B12 level 04/03/2019  . Lower extremity edema   . Medicare annual wellness visit, subsequent 08/09/2020  . Mixed hyperlipidemia 05/03/2018  . Myalgia 04/19/2019  . Osteoarthritis   . Other emphysema ([place])   . Paresthesias in left hand 11/30/2019  . Pneumonia 04/01/2019  . Pneumonitis   . Polymyositis ([place])   . Primary insomnia   . Renal stones   . Sepsis ([place]) 04/19/2019  . Severe sepsis ([place]) 07/07/2020  . Skin cancer   . Transaminitis   . Trigger finger 05/03/2018  . UC (ulcerative colitis) ([place])   . UTI (urinary tract infection)     Surgeries: Procedure(s): LEFT TOTAL KNEE ARTHROPLASTY on 12/17/2020   Consultants:   Discharged Condition: Improved  Hospital Course: Kerry Matthews is an 85 y.o. male who was admitted 12/17/2020 for operative treatment of<principal problem not specified>. Patient has severe unremitting pain that affects sleep, daily activities, and work/hobbies. After pre-op clearance the patient was taken to the operating room on 12/17/2020 and underwent  Procedure(s): LEFT TOTAL KNEE ARTHROPLASTY.    Patient was given perioperative antibiotics:  Anti-infectives (From admission, onward)   Start     Dose/Rate Route Frequency Ordered Stop   12/17/20 1500  ceFAZolin (ANCEF) IVPB 2g/100 mL premix  2 g 200 mL/hr over 30 Minutes Intravenous Every 6 hours 12/17/20 1107 12/17/20 2139   12/17/20 0809  vancomycin (VANCOCIN) powder  Status:  Discontinued          As needed 12/17/20 0809 12/17/20 0942   12/17/20 0600  ceFAZolin (ANCEF) IVPB 2g/100 mL premix        2  g 200 mL/hr over 30 Minutes Intravenous On call to O.R. 12/17/20 0542 12/17/20 4008       Patient was given sequential compression devices, early ambulation, and chemoprophylaxis to prevent DVT.  Patient benefited maximally from hospital stay and there were no complications.    Recent vital signs:  Patient Vitals for the past 24 hrs:  BP Temp Temp src Pulse Resp SpO2  12/18/20 0721 (!) 151/85 97.7 F (36.5 C) Oral (!) 49 17 97 %  12/17/20 2112 119/64 97.6 F (36.4 C) Oral (!) 56 16 96 %  12/17/20 1621 (!) 145/67 99.2 F (37.3 C) Oral (!) 59 17 98 %  12/17/20 1342 116/63 97.6 F (36.4 C) -- (!) 51 16 96 %  12/17/20 1211 127/72 (!) 97.3 F (36.3 C) Oral (!) 55 18 97 %  12/17/20 1134 133/70 -- -- (!) 50 16 100 %  12/17/20 1104 (!) 135/57 (!) 97 F (36.1 C) -- 84 (!) 28 99 %  12/17/20 1050 (!) 143/59 -- -- (!) 31 (!) 27 99 %  12/17/20 1005 (!) 149/58 -- -- (!) 53 20 (!) 88 %  12/17/20 0950 (!) 181/72 98.2 F (36.8 C) -- (!) 56 (!) 24 100 %  12/17/20 0942 -- -- -- -- (!) 0 --  12/17/20 0941 -- -- -- -- (!) 1 --  12/17/20 0940 -- -- -- -- (!) 24 --  12/17/20 0939 -- -- -- 69 19 98 %  12/17/20 0938 -- -- -- 71 15 99 %  12/17/20 0937 -- -- -- 69 (!) 3 99 %  12/17/20 0936 (!) 177/70 -- -- (!) 51 12 99 %  12/17/20 0935 -- -- -- (!) 48 (!) 1 99 %  12/17/20 0934 -- -- -- (!) 51 (!) 7 99 %  12/17/20 0933 (!) 186/78 -- -- (!) 51 (!) 0 99 %  12/17/20 0932 -- -- -- (!) 53 (!) 0 99 %  12/17/20 0931 -- -- -- (!) 50 13 99 %  12/17/20 0930 (!) 167/89 -- -- (!) 53 16 99 %  12/17/20 0929 -- -- -- (!) 49 12 99 %  12/17/20 0928 -- -- -- (!) 49 16 99 %     Recent laboratory studies:  Recent Labs    12/18/20 0603  WBC 13.7*  HGB 11.5*  HCT 34.3*  PLT 270  NA 133*  K 4.2  CL 100  CO2 27  BUN 13  CREATININE 1.35*  GLUCOSE 154*  CALCIUM 8.3*     Discharge Medications:   Allergies as of 12/18/2020      Reactions   Ace Inhibitors Other (See Comments)   Slow heart rate    Hydrocodone Nausea And Vomiting   Hydrocodone-acetaminophen Nausea And Vomiting   Nsaids    Kidney issues   Sulfamethoxazole-trimethoprim Nausea And Vomiting      Medication List    STOP taking these medications   Fish Oil 1000 MG Caps     TAKE these medications   aspirin EC 81 MG tablet Take 1 tablet (81 mg total) by mouth 2 (two) times daily. To be taken after surgery What  changed:   when to take this  additional instructions   atorvastatin 20 MG tablet Commonly known as: LIPITOR TAKE 1 TABLET (20 MG TOTAL) BY MOUTH AT BEDTIME.   cephALEXin 500 MG capsule Commonly known as: Keflex Take 1 capsule (500 mg total) by mouth 4 (four) times daily. To be taken after surgery   docusate sodium 100 MG capsule Commonly known as: Colace Take 1 capsule (100 mg total) by mouth daily as needed.   furosemide 20 MG tablet Commonly known as: LASIX Take 20 mg by mouth daily as needed for fluid.   gabapentin 400 MG capsule Commonly known as: NEURONTIN TAKE 1 CAPSULE THREE TIMES DAILY What changed: See the new instructions.   methocarbamol 500 MG tablet Commonly known as: Robaxin Take 1 tablet (500 mg total) by mouth 2 (two) times daily as needed. To be taken after surgery   nitroGLYCERIN 0.4 MG SL tablet Commonly known as: NITROSTAT Place 1 tablet (0.4 mg total) under the tongue every 5 (five) minutes as needed for chest pain.   omeprazole 20 MG capsule Commonly known as: PRILOSEC TAKE 1 CAPSULE EVERY DAY   ondansetron 4 MG tablet Commonly known as: Zofran Take 1 tablet (4 mg total) by mouth every 8 (eight) hours as needed for nausea or vomiting.   oxyCODONE-acetaminophen 5-325 MG tablet Commonly known as: Percocet Take 1-2 tablets by mouth every 6 (six) hours as needed. To be taken after surgery   PreserVision AREDS 2 Caps Take 1 capsule by mouth 2 (two) times daily after a meal.   Rituxan 100 MG/10ML injection Generic drug: riTUXimab Inject 100 mg into the vein  every 6 (six) months.   True Metrix Blood Glucose Test test strip Generic drug: glucose blood E11.69 TEST BLOOD SUGAR THREE TIMES DAILY BEFORE MEALS   True Metrix Meter Devi E11.69 Use as directed   vitamin B-12 1000 MCG tablet Commonly known as: CYANOCOBALAMIN Take 1 tablet (1,000 mcg total) by mouth daily. What changed: when to take this            Durable Medical Equipment  (From admission, onward)         Start     Ordered   12/17/20 1218  DME Walker rolling  Once       Question:  Patient needs a walker to treat with the following condition  Answer:  Total knee replacement status   12/17/20 1217   12/17/20 1218  DME 3 n 1  Once        12/17/20 1217   12/17/20 1218  DME Bedside commode  Once       Question:  Patient needs a bedside commode to treat with the following condition  Answer:  Total knee replacement status   12/17/20 1217          Diagnostic Studies: DG Knee 2 Views Left  Result Date: 12/17/2020 CLINICAL DATA:  85 year old male with planned left knee surgery. EXAM: LEFT KNEE - 1-2 VIEW COMPARISON:  Left knee MRI 12/19/2019. FINDINGS: Portable AP and cross-table lateral views at 0603 hours. No definite joint effusion. Abnormal sclerosis and lucency of the medial right femoral condyle articular surface with partial concave collapse. Associated medial compartment joint space loss and spurring. Mild patellofemoral compartment spurring. No superimposed acute osseous abnormality elsewhere. IMPRESSION: Medial femoral condyle osteochondral defect and medial joint space loss. Electronically Signed   By: Genevie Ann M.D.   On: 12/17/2020 06:40   DG Knee Left Port  Result Date: 12/17/2020  CLINICAL DATA:  85 year old male status post left knee replacement. EXAM: PORTABLE LEFT KNEE - 1-2 VIEW COMPARISON:  Preoperative radiographs 0600 hours today. FINDINGS: Portable AP and cross-table lateral views demonstrate interval left total knee arthroplasty. Hardware appears intact and  normally aligned. Small air-fluid level within the joint space. No unexpected osseous changes. IMPRESSION: Left total knee arthroplasty with no adverse features. Electronically Signed   By: Genevie Ann M.D.   On: 12/17/2020 10:18    Disposition: Discharge disposition: 01-Home or Self Care          Follow-up Information    Leandrew Koyanagi, MD. Go on 01/01/2021.   Specialty: Orthopedic Surgery Why: at 1:30 pm For suture removal, For wound re-check Contact information: [address] [place] [address] [phone]                Signed: Aundra Dubin 12/18/2020, 7:58 AM

## 2020-12-18 NOTE — TOC Progression Note (Addendum)
Transition of Care [facility]) - Progression Note    Patient Details  Name: Kerry Matthews MRN: [id number] Date of Birth: March 04, 1936  [facility]) CM/SW Contact  Milinda Antis, [place] Phone Number: 12/18/2020, 9:21 AM  Clinical Narrative:     CSW received consult for [facility] post surgery.  CSW reviewed notes and patient has an ortho bundle and another case manager who has set up DME and is following patient.  CSW spoke with Jamse Arn, RN who confirmed that everything has been set up for this patient's return home.  CSW signing off.  Please consult if any other d/c needs arise.  Lind Covert, MSW, [place]       Expected Discharge Plan and Services           Expected Discharge Date: 12/18/20               DME Arranged: CPM,Walker rolling DME Agency: [employer]       HH Arranged: PT [place] Agency: [facility] (now [facility])         Social Determinants of Health (SDOH) Interventions    Readmission Risk Interventions Readmission Risk Prevention Plan 07/09/2020  Post Dischage Appt Complete  Medication Screening Complete  Transportation Screening Complete  Some recent data might be hidden

## 2020-12-18 NOTE — Progress Notes (Addendum)
Subjective: 1 Day Post-Op Procedure(s) (LRB): LEFT TOTAL KNEE ARTHROPLASTY (Left) Patient reports pain as mild.    Objective: Vital signs in last 24 hours: Temp:  [97 F (36.1 C)-99.2 F (37.3 C)] 97.7 F (36.5 C) (04/26 0721) Pulse Rate:  [31-84] 49 (04/26 0721) Resp:  [0-28] 17 (04/26 0721) BP: (116-186)/(57-89) 151/85 (04/26 0721) SpO2:  [88 %-100 %] 97 % (04/26 0721)  Intake/Output from previous day: 04/25 0701 - 04/26 0700 In: 1163.4 [P.O.:240; I.V.:723.4; IV Piggyback:200] Out: 400 [Urine:350; Blood:50] Intake/Output this shift: Total I/O In: -  Out: 400 [Urine:400]  Recent Labs    12/18/20 0603  HGB 11.5*   Recent Labs    12/18/20 0603  WBC 13.7*  RBC 3.98*  HCT 34.3*  PLT 270   Recent Labs    12/18/20 0603  NA 133*  K 4.2  CL 100  CO2 27  BUN 13  CREATININE 1.35*  GLUCOSE 154*  CALCIUM 8.3*   No results for input(s): LABPT, INR in the last 72 hours.  Neurologically intact Neurovascular intact Sensation intact distally Intact pulses distally Dorsiflexion/Plantar flexion intact Incision: scant drainage No cellulitis present Compartment soft   Assessment/Plan: 1 Day Post-Op Procedure(s) (LRB): LEFT TOTAL KNEE ARTHROPLASTY (Left) Advance with PT AKI- continue IV fluids.  Encouraged po fluid intake WBAT LLE ABLA- mild and stable PLEASE REAPPLY THIGH HIGH TED HOSE BLE D/c home after second PT session as long as he continues to mobilize well    Anticipated LOS equal to or greater than 2 midnights due to - Age 83 and older with one or more of the following:  - Obesity  - Expected need for hospital services (PT, OT, Nursing) required for safe  discharge  - Anticipated need for postoperative skilled nursing care or inpatient rehab  - Active co-morbidities: Diabetes OR   - Unanticipated findings during/Post Surgery: None  - Patient is a high risk of re-admission due to: None    Aundra Dubin 12/18/2020, 7:55 AM

## 2020-12-18 NOTE — Telephone Encounter (Signed)
Ortho bundle D/C call completed. 

## 2020-12-18 NOTE — Progress Notes (Signed)
Physical Therapy Treatment Patient Details Name: Kerry Matthews MRN: [id number] DOB: [birth year] Today's Date: 12/18/2020    History of Present Illness Pt is 85 yo male s/p L TKA on 12/17/20.  He has PMH of arthritis, CAD, DM, HTN, back surgery.    PT Comments    Pt progressing well. Pt complete stair negotiation with good understanding of sideways technique with L handrail to mimic home set up. Pt safe to d/c home once medically stable. Acute PT to cont to follow.   Follow Up Recommendations  Follow surgeon's recommendation for DC plan and follow-up therapies;Supervision for mobility/OOB     Equipment Recommendations  None recommended by PT    Recommendations for Other Services       Precautions / Restrictions Precautions Precautions: Fall Restrictions Weight Bearing Restrictions: Yes LLE Weight Bearing: Weight bearing as tolerated    Mobility  Bed Mobility Overal bed mobility: Needs Assistance Bed Mobility: Supine to Sit     Supine to sit: Min guard     General bed mobility comments: labored effort, HOB flat, achieved long sit position, able to bring LEs off EOB    Transfers Overall transfer level: Needs assistance Equipment used: Rolling walker (2 wheeled) Transfers: Sit to/from Stand Sit to Stand: Min guard         General transfer comment: cues for safe hand placement and L LE management  Ambulation/Gait Ambulation/Gait assistance: Min guard Gait Distance (Feet): 200 Feet Assistive device: Rolling walker (2 wheeled) Gait Pattern/deviations: Step-through pattern;Decreased stride length Gait velocity: decreased Gait velocity interpretation: 1.31 - 2.62 ft/sec, indicative of limited community ambulator General Gait Details: cues to stay in walker, some trunk flexion most likely baseline due to age, verbal cues to decrease UE support and increased bilat LE support   Stairs Stairs: Yes Stairs assistance: Min guard Stair Management: One rail Left;Step to  pattern;Sideways Number of Stairs: 3 (x2) General stair comments: mimic home set up, verbal cues for side ways and "up with the good, down with the bad" sequence   Wheelchair Mobility    Modified Rankin (Stroke Patients Only)       Balance Overall balance assessment: Needs assistance Sitting-balance support: No upper extremity supported Sitting balance-Leahy Scale: Good     Standing balance support: No upper extremity supported;During functional activity Standing balance-Leahy Scale: Fair Standing balance comment: pt able to stand at bedside to use urinal, leaned on bed with back of legs                            Cognition Arousal/Alertness: Awake/alert Behavior During Therapy: WFL for tasks assessed/performed Overall Cognitive Status: Within Functional Limits for tasks assessed                                 General Comments: HOH which contributes to delayed processing and inconsistent command following      Exercises Total Joint Exercises Ankle Circles/Pumps: AROM;Both;10 reps;Supine Quad Sets: AROM;Supine;Both;10 reps Heel Slides: AROM;Left;10 reps;Seated (with wash clothe on floor) Long Arc Quad: AROM;Left;10 reps;Supine Goniometric ROM: 6-95 deg    General Comments General comments (skin integrity, edema, etc.): pt with some bleeding from incision seen through dressing, pt with drainage from IV site, contacted RN      Pertinent Vitals/Pain Pain Assessment: 0-10 Pain Score: 2  Pain Location: L knee Pain Descriptors / Indicators: Discomfort Pain Intervention(s): Monitored during session  Home Living                      Prior Function            PT Goals (current goals can now be found in the care plan section) Acute Rehab PT Goals Patient Stated Goal: go home Progress towards PT goals: Progressing toward goals    Frequency    7X/week      PT Plan Current plan remains appropriate    Co-evaluation               AM-PAC PT "6 Clicks" Mobility   Outcome Measure  Help needed turning from your back to your side while in a flat bed without using bedrails?: None Help needed moving from lying on your back to sitting on the side of a flat bed without using bedrails?: None Help needed moving to and from a bed to a chair (including a wheelchair)?: A Little Help needed standing up from a chair using your arms (e.g., wheelchair or bedside chair)?: A Little Help needed to walk in hospital room?: A Little Help needed climbing 3-5 steps with a railing? : A Little 6 Click Score: 20    End of Session Equipment Utilized During Treatment: Gait belt Activity Tolerance: Patient tolerated treatment well Patient left: in chair;with call bell/phone within reach Nurse Communication: Mobility status PT Visit Diagnosis: Other abnormalities of gait and mobility (R26.89);Muscle weakness (generalized) (M62.81);Pain Pain - Right/Left: Left Pain - part of body: Knee     Time: 9371-6967 PT Time Calculation (min) (ACUTE ONLY): 35 min  Charges:  $Gait Training: 8-22 mins $Therapeutic Exercise: 8-22 mins                     [name], PT, DPT Acute Rehabilitation Services Pager #: [phone] Office #: [phone]    Kerry Matthews 12/18/2020, 9:51 AM

## 2020-12-19 DIAGNOSIS — M545 Low back pain, unspecified: Secondary | ICD-10-CM | POA: Diagnosis not present

## 2020-12-19 DIAGNOSIS — I129 Hypertensive chronic kidney disease with stage 1 through stage 4 chronic kidney disease, or unspecified chronic kidney disease: Secondary | ICD-10-CM | POA: Diagnosis not present

## 2020-12-19 DIAGNOSIS — Z471 Aftercare following joint replacement surgery: Secondary | ICD-10-CM | POA: Diagnosis not present

## 2020-12-19 DIAGNOSIS — N189 Chronic kidney disease, unspecified: Secondary | ICD-10-CM | POA: Diagnosis not present

## 2020-12-19 DIAGNOSIS — E669 Obesity, unspecified: Secondary | ICD-10-CM | POA: Diagnosis not present

## 2020-12-19 DIAGNOSIS — I2510 Atherosclerotic heart disease of native coronary artery without angina pectoris: Secondary | ICD-10-CM | POA: Diagnosis not present

## 2020-12-19 DIAGNOSIS — E1122 Type 2 diabetes mellitus with diabetic chronic kidney disease: Secondary | ICD-10-CM | POA: Diagnosis not present

## 2020-12-19 DIAGNOSIS — Z96652 Presence of left artificial knee joint: Secondary | ICD-10-CM | POA: Diagnosis not present

## 2020-12-19 DIAGNOSIS — D631 Anemia in chronic kidney disease: Secondary | ICD-10-CM | POA: Diagnosis not present

## 2020-12-20 DIAGNOSIS — E1122 Type 2 diabetes mellitus with diabetic chronic kidney disease: Secondary | ICD-10-CM | POA: Diagnosis not present

## 2020-12-20 DIAGNOSIS — Z471 Aftercare following joint replacement surgery: Secondary | ICD-10-CM | POA: Diagnosis not present

## 2020-12-20 DIAGNOSIS — N189 Chronic kidney disease, unspecified: Secondary | ICD-10-CM | POA: Diagnosis not present

## 2020-12-20 DIAGNOSIS — E669 Obesity, unspecified: Secondary | ICD-10-CM | POA: Diagnosis not present

## 2020-12-20 DIAGNOSIS — M545 Low back pain, unspecified: Secondary | ICD-10-CM | POA: Diagnosis not present

## 2020-12-20 DIAGNOSIS — D631 Anemia in chronic kidney disease: Secondary | ICD-10-CM | POA: Diagnosis not present

## 2020-12-20 DIAGNOSIS — Z96652 Presence of left artificial knee joint: Secondary | ICD-10-CM | POA: Diagnosis not present

## 2020-12-20 DIAGNOSIS — I129 Hypertensive chronic kidney disease with stage 1 through stage 4 chronic kidney disease, or unspecified chronic kidney disease: Secondary | ICD-10-CM | POA: Diagnosis not present

## 2020-12-20 DIAGNOSIS — I2510 Atherosclerotic heart disease of native coronary artery without angina pectoris: Secondary | ICD-10-CM | POA: Diagnosis not present

## 2020-12-21 DIAGNOSIS — Z96652 Presence of left artificial knee joint: Secondary | ICD-10-CM | POA: Diagnosis not present

## 2020-12-21 DIAGNOSIS — E1122 Type 2 diabetes mellitus with diabetic chronic kidney disease: Secondary | ICD-10-CM | POA: Diagnosis not present

## 2020-12-21 DIAGNOSIS — I2510 Atherosclerotic heart disease of native coronary artery without angina pectoris: Secondary | ICD-10-CM | POA: Diagnosis not present

## 2020-12-21 DIAGNOSIS — D631 Anemia in chronic kidney disease: Secondary | ICD-10-CM | POA: Diagnosis not present

## 2020-12-21 DIAGNOSIS — M545 Low back pain, unspecified: Secondary | ICD-10-CM | POA: Diagnosis not present

## 2020-12-21 DIAGNOSIS — Z471 Aftercare following joint replacement surgery: Secondary | ICD-10-CM | POA: Diagnosis not present

## 2020-12-21 DIAGNOSIS — N189 Chronic kidney disease, unspecified: Secondary | ICD-10-CM | POA: Diagnosis not present

## 2020-12-21 DIAGNOSIS — E669 Obesity, unspecified: Secondary | ICD-10-CM | POA: Diagnosis not present

## 2020-12-21 DIAGNOSIS — I129 Hypertensive chronic kidney disease with stage 1 through stage 4 chronic kidney disease, or unspecified chronic kidney disease: Secondary | ICD-10-CM | POA: Diagnosis not present

## 2020-12-24 DIAGNOSIS — M545 Low back pain, unspecified: Secondary | ICD-10-CM | POA: Diagnosis not present

## 2020-12-24 DIAGNOSIS — N189 Chronic kidney disease, unspecified: Secondary | ICD-10-CM | POA: Diagnosis not present

## 2020-12-24 DIAGNOSIS — Z96652 Presence of left artificial knee joint: Secondary | ICD-10-CM | POA: Diagnosis not present

## 2020-12-24 DIAGNOSIS — I129 Hypertensive chronic kidney disease with stage 1 through stage 4 chronic kidney disease, or unspecified chronic kidney disease: Secondary | ICD-10-CM | POA: Diagnosis not present

## 2020-12-24 DIAGNOSIS — D631 Anemia in chronic kidney disease: Secondary | ICD-10-CM | POA: Diagnosis not present

## 2020-12-24 DIAGNOSIS — E669 Obesity, unspecified: Secondary | ICD-10-CM | POA: Diagnosis not present

## 2020-12-24 DIAGNOSIS — I2510 Atherosclerotic heart disease of native coronary artery without angina pectoris: Secondary | ICD-10-CM | POA: Diagnosis not present

## 2020-12-24 DIAGNOSIS — Z471 Aftercare following joint replacement surgery: Secondary | ICD-10-CM | POA: Diagnosis not present

## 2020-12-24 DIAGNOSIS — E1122 Type 2 diabetes mellitus with diabetic chronic kidney disease: Secondary | ICD-10-CM | POA: Diagnosis not present

## 2020-12-25 ENCOUNTER — Telehealth: Payer: Self-pay | Admitting: *Deleted

## 2020-12-25 DIAGNOSIS — M545 Low back pain, unspecified: Secondary | ICD-10-CM | POA: Diagnosis not present

## 2020-12-25 DIAGNOSIS — D631 Anemia in chronic kidney disease: Secondary | ICD-10-CM | POA: Diagnosis not present

## 2020-12-25 DIAGNOSIS — I2510 Atherosclerotic heart disease of native coronary artery without angina pectoris: Secondary | ICD-10-CM | POA: Diagnosis not present

## 2020-12-25 DIAGNOSIS — E669 Obesity, unspecified: Secondary | ICD-10-CM | POA: Diagnosis not present

## 2020-12-25 DIAGNOSIS — I129 Hypertensive chronic kidney disease with stage 1 through stage 4 chronic kidney disease, or unspecified chronic kidney disease: Secondary | ICD-10-CM | POA: Diagnosis not present

## 2020-12-25 DIAGNOSIS — Z96652 Presence of left artificial knee joint: Secondary | ICD-10-CM | POA: Diagnosis not present

## 2020-12-25 DIAGNOSIS — Z471 Aftercare following joint replacement surgery: Secondary | ICD-10-CM | POA: Diagnosis not present

## 2020-12-25 DIAGNOSIS — N189 Chronic kidney disease, unspecified: Secondary | ICD-10-CM | POA: Diagnosis not present

## 2020-12-25 DIAGNOSIS — E1122 Type 2 diabetes mellitus with diabetic chronic kidney disease: Secondary | ICD-10-CM | POA: Diagnosis not present

## 2020-12-25 NOTE — Telephone Encounter (Signed)
Ortho bundle 7 day call completed. 

## 2020-12-27 DIAGNOSIS — Z96652 Presence of left artificial knee joint: Secondary | ICD-10-CM | POA: Diagnosis not present

## 2020-12-27 DIAGNOSIS — M1712 Unilateral primary osteoarthritis, left knee: Secondary | ICD-10-CM | POA: Diagnosis not present

## 2020-12-27 DIAGNOSIS — M25562 Pain in left knee: Secondary | ICD-10-CM | POA: Diagnosis not present

## 2020-12-31 DIAGNOSIS — M1712 Unilateral primary osteoarthritis, left knee: Secondary | ICD-10-CM | POA: Diagnosis not present

## 2020-12-31 DIAGNOSIS — Z96652 Presence of left artificial knee joint: Secondary | ICD-10-CM | POA: Diagnosis not present

## 2020-12-31 DIAGNOSIS — M25562 Pain in left knee: Secondary | ICD-10-CM | POA: Diagnosis not present

## 2021-01-01 ENCOUNTER — Ambulatory Visit (INDEPENDENT_AMBULATORY_CARE_PROVIDER_SITE_OTHER): Payer: Medicare HMO | Admitting: Orthopaedic Surgery

## 2021-01-01 ENCOUNTER — Telehealth: Payer: Self-pay | Admitting: *Deleted

## 2021-01-01 ENCOUNTER — Encounter: Payer: Self-pay | Admitting: Orthopaedic Surgery

## 2021-01-01 DIAGNOSIS — Z96652 Presence of left artificial knee joint: Secondary | ICD-10-CM

## 2021-01-01 NOTE — Telephone Encounter (Signed)
Ortho bundle 14 day in office meeting completed. °

## 2021-01-01 NOTE — Progress Notes (Signed)
Post-Op Visit Note   Patient: Kerry Matthews           Date of Birth: [birth year]           MRN: [id number] Visit Date: 01/01/2021 PCP: [name], MD   Assessment & Plan:  Chief Complaint:  Chief Complaint  Patient presents with  . Left Knee - Pain   Visit Diagnoses:  1. Status post left knee replacement     Plan:   Kerry Matthews is 2-week status post left total knee replacement.  He has already completed home health PT and started outpatient PT.  He is using a rolling walker and takes oxycodone at nighttime.  Overall he is doing well has no complaints.  Left knee shows a moderate swelling.  Range of motion is 6 to 95 degrees.  Stable to varus valgus.  No signs of infection or drainage.  No calf tenderness.  Kerry Matthews is doing well at this juncture.  We remove the sutures in place Steri-Strips.  He may discontinue TED hose.  He will continue outpatient PT.  Implant card provided today.  Dental prophylaxis reinforced.  Continue aspirin for DVT prophylaxis.  Recheck in 4 weeks with two-view x-rays of the left knee.  Follow-Up Instructions: Return in about 4 weeks (around 01/29/2021).   Orders:  No orders of the defined types were placed in this encounter.  No orders of the defined types were placed in this encounter.   Imaging: No results found.  PMFS History: Patient Active Problem List   Diagnosis Date Noted  . 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/17/2020  . Hematuria 09/07/2020  . Proteinuria 09/07/2020  . Dysuria 09/07/2020  . Urinary urgency 09/07/2020  . Renal insufficiency 09/06/2020  . Coronary artery disease   . Diabetes ([place])   . GAD (generalized anxiety disorder)   . Hyperlipidemia   . Osteoarthritis   . Other emphysema ([place])   . Primary insomnia   . Renal stones   . Skin cancer   . UC (ulcerative colitis) ([place])   . UTI (urinary tract infection)   . Medicare annual wellness visit, subsequent 08/09/2020  . Severe sepsis ([place]) 07/07/2020  . Acute  lower UTI 07/07/2020  . Leukocytosis 03/08/2020  . Acute low back pain without sciatica 03/08/2020  . Peripheral polyneuropathy 01/30/2020  . Body mass index (BMI) 29.0-29.9, adult 01/05/2020  . Burning sensation of feet 01/05/2020  . Acute pain of left knee 11/30/2019  . Paresthesias in left hand 11/30/2019  . Alpha-1-antitrypsin deficiency carrier 05/04/2019  . Sepsis ([place]) 04/19/2019  . Myalgia 04/19/2019  . History of alpha-1-antitrypsin deficiency 04/12/2019  . Elevated rheumatoid factor 04/12/2019  . Interstitial pulmonary disease ([place]) 04/12/2019  . Abnormal ANCA test 04/12/2019  . Abnormal findings on diagnostic imaging of lung 04/12/2019  . Transaminitis   . Hypoxemia   . Polymyositis ([place])   . Low vitamin B12 level 04/03/2019  . Community acquired pneumonia   . Pneumonitis   . Cough   . Fever   . Abnormal CT of the chest   . Lower extremity edema   . AKI (acute kidney injury) ([place])   . Anemia   . Pneumonia 04/01/2019  . Elevated LFTs 04/01/2019  . Diabetes mellitus type 2 in obese ([place]) 04/01/2019  . Dyslipidemia associated with type 2 diabetes mellitus ([place]) 05/03/2018  . Essential hypertension 05/03/2018  . Mixed hyperlipidemia 05/03/2018  . Atherosclerotic heart disease of native coronary artery without angina pectoris 05/03/2018  . GERD (gastroesophageal  reflux disease) 05/03/2018  . Trigger finger 05/03/2018  . Bradycardia 05/03/2018  . CKD (chronic kidney disease) 05/03/2018  . Insomnia 05/03/2018  . Emphysema (subcutaneous) (surgical) resulting from a procedure 05/03/2018   Past Medical History:  Diagnosis Date  . 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 Abnormal CT of the chest   . Abnormal findings on diagnostic imaging of lung 04/12/2019   04/01/2019-CT  chest with contrast- no acute process in chest abdomen or pelvis, interstitial lung disease suspicious for early or mild UIP, pulmonary artery enlargement suggest PAH   . Acute low back pain without sciatica 03/08/2020  . Acute lower UTI 07/07/2020  . Acute pain of left knee 11/30/2019  . AKI (acute kidney injury) ([place])   . Alpha-1-antitrypsin deficiency carrier 05/04/2019  . Anemia   . Atherosclerotic heart disease of native coronary artery without angina pectoris 05/03/2018  . Bradycardia   . CKD (chronic kidney disease) 05/03/2018  . Community acquired pneumonia   . Coronary artery disease   . Cough   . Diabetes ([place])   . Diabetes mellitus type 2 in obese ([place]) 04/01/2019  . Dyslipidemia associated with type 2 diabetes mellitus ([place]) 05/03/2018  . Elevated LFTs 04/01/2019  . Elevated rheumatoid factor 04/12/2019   04/03/2019-rheumatoid factor-95.4  . Emphysema (subcutaneous) (surgical) resulting from a procedure 05/03/2018  . Essential hypertension 05/03/2018  . Fever   . GAD (generalized anxiety disorder)   . GERD (gastroesophageal reflux disease)   . History of alpha-1-antitrypsin deficiency 04/12/2019  . History of kidney stones   . Hyperlipidemia   . Hypertension   . Hypoxemia   . Insomnia 05/03/2018  . Interstitial pulmonary disease ([place]) 04/12/2019   04/01/2019-CT chest with contrast- no acute process in chest abdomen or pelvis, interstitial lung disease suspicious for early or mild UIP, pulmonary artery enlargement suggest PAH  04/02/2019-connective tissue work-up: Anti-Jo 1-negative Anti-DNA antibody double-stranded-negative Anti-scleroderma antibody-negative Sjogren's syndrome antibody-negative Sjogren's syndrome antibody-negative CK-31 CCP-6 E  . Leukocytosis 03/08/2020  . Low vitamin B12 level 04/03/2019  . Lower extremity edema   . Medicare annual wellness visit, subsequent 08/09/2020  . Mixed hyperlipidemia 05/03/2018  . Myalgia 04/19/2019  . Osteoarthritis   . Other emphysema ([place])   .  Paresthesias in left hand 11/30/2019  . Pneumonia 04/01/2019  . Pneumonitis   . Polymyositis ([place])   . Primary insomnia   . Renal stones   . Sepsis ([place]) 04/19/2019  . Severe sepsis ([place]) 07/07/2020  . Skin cancer   . Transaminitis   . Trigger finger 05/03/2018  . UC (ulcerative colitis) ([place])   . UTI (urinary tract infection)     Family History  Problem Relation Age of Onset  . Tuberculosis Mother   . Stroke Father   . Pancreatic cancer Sister   . Heart attack Sister   . Lung disease Sister   . Clotting disorder Brother   . Colon cancer Neg Hx   . Esophageal cancer Neg Hx     Past Surgical History:  Procedure Laterality Date  . ANGIOPLASTY    . BACK SURGERY    . CATARACT EXTRACTION    . COLONOSCOPY  06/16/2005   Mild colitis involving splenic flexure. Colonic polyps, status post polypectomy. Mild pancolonic diverticulitits. Internal hemorrhoids.   . ESOPHAGOGASTRODUODENOSCOPY  04/26/2003   Irregular Z  line suggestive of GERD. Mild gastritis status post CLO testing.   Marland Kitchen TOTAL KNEE ARTHROPLASTY Left 12/17/2020   Procedure: LEFT TOTAL KNEE ARTHROPLASTY;  Surgeon: Leandrew Koyanagi, MD;  Location: [facility];  Service: Orthopedics;  Laterality: Left;  . TRIGGER FINGER RELEASE     Social History   Occupational History  . Occupation: retired  Tobacco Use  . Smoking status: Never Smoker  . Smokeless tobacco: Never Used  Vaping Use  . Vaping Use: Never used  Substance and Sexual Activity  . Alcohol use: Never  . Drug use: Never  . Sexual activity: Not on file

## 2021-01-02 DIAGNOSIS — M25562 Pain in left knee: Secondary | ICD-10-CM | POA: Diagnosis not present

## 2021-01-02 DIAGNOSIS — Z96652 Presence of left artificial knee joint: Secondary | ICD-10-CM | POA: Diagnosis not present

## 2021-01-02 DIAGNOSIS — M1712 Unilateral primary osteoarthritis, left knee: Secondary | ICD-10-CM | POA: Diagnosis not present

## 2021-01-07 DIAGNOSIS — Z96652 Presence of left artificial knee joint: Secondary | ICD-10-CM | POA: Diagnosis not present

## 2021-01-07 DIAGNOSIS — M1712 Unilateral primary osteoarthritis, left knee: Secondary | ICD-10-CM | POA: Diagnosis not present

## 2021-01-07 DIAGNOSIS — M25562 Pain in left knee: Secondary | ICD-10-CM | POA: Diagnosis not present

## 2021-01-10 DIAGNOSIS — M1712 Unilateral primary osteoarthritis, left knee: Secondary | ICD-10-CM | POA: Diagnosis not present

## 2021-01-10 DIAGNOSIS — Z96652 Presence of left artificial knee joint: Secondary | ICD-10-CM | POA: Diagnosis not present

## 2021-01-10 DIAGNOSIS — M25562 Pain in left knee: Secondary | ICD-10-CM | POA: Diagnosis not present

## 2021-01-14 ENCOUNTER — Ambulatory Visit (INDEPENDENT_AMBULATORY_CARE_PROVIDER_SITE_OTHER): Payer: Medicare HMO | Admitting: Family Medicine

## 2021-01-14 ENCOUNTER — Other Ambulatory Visit: Payer: Self-pay

## 2021-01-14 ENCOUNTER — Encounter: Payer: Self-pay | Admitting: Family Medicine

## 2021-01-14 VITALS — BP 128/64 | HR 88 | Temp 97.7°F | Resp 16 | Ht 67.0 in | Wt 187.0 lb

## 2021-01-14 DIAGNOSIS — M1712 Unilateral primary osteoarthritis, left knee: Secondary | ICD-10-CM | POA: Diagnosis not present

## 2021-01-14 DIAGNOSIS — Z96652 Presence of left artificial knee joint: Secondary | ICD-10-CM | POA: Diagnosis not present

## 2021-01-14 DIAGNOSIS — M25562 Pain in left knee: Secondary | ICD-10-CM | POA: Diagnosis not present

## 2021-01-14 DIAGNOSIS — R300 Dysuria: Secondary | ICD-10-CM

## 2021-01-14 LAB — POCT URINALYSIS DIPSTICK
Bilirubin, UA: NEGATIVE
Blood, UA: NEGATIVE
Glucose, UA: NEGATIVE
Ketones, UA: NEGATIVE
Leukocytes, UA: NEGATIVE
Nitrite, UA: NEGATIVE
Protein, UA: NEGATIVE
Spec Grav, UA: 1.015 (ref 1.010–1.025)
Urobilinogen, UA: 0.2 E.U./dL
pH, UA: 6 (ref 5.0–8.0)

## 2021-01-14 MED ORDER — PHENAZOPYRIDINE HCL 200 MG PO TABS: 200.0000 mg | ORAL_TABLET | Freq: Three times a day (TID) | ORAL | 0 refills | Status: DC | PRN

## 2021-01-14 MED ORDER — GABAPENTIN 400 MG PO CAPS: 400.0000 mg | ORAL_CAPSULE | Freq: Two times a day (BID) | ORAL | 0 refills | Status: DC

## 2021-01-14 NOTE — Progress Notes (Deleted)
Acute Office Visit  Subjective:    Patient ID: Kerry Matthews, male    DOB: 06-06-1936, 85 y.o.   MRN: [id number]  Chief Complaint  Patient presents with  . Dysuria    HPI Patient is in today for ***  Past Medical History:  Diagnosis Date  . 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 Abnormal CT of the chest   . Abnormal findings on diagnostic imaging of lung 04/12/2019   04/01/2019-CT chest with contrast- no acute process in chest abdomen or pelvis, interstitial lung disease suspicious for early or mild UIP, pulmonary artery enlargement suggest PAH   . Acute low back pain without sciatica 03/08/2020  . Acute lower UTI 07/07/2020  . Acute pain of left knee 11/30/2019  . AKI (acute kidney injury) ([place])   . Alpha-1-antitrypsin deficiency carrier 05/04/2019  . Anemia   . Atherosclerotic heart disease of native coronary artery without angina pectoris 05/03/2018  . Bradycardia   . CKD (chronic kidney disease) 05/03/2018  . Community acquired pneumonia   . Coronary artery disease   . Cough   . Diabetes ([place])   . Diabetes mellitus type 2 in obese ([place]) 04/01/2019  . Dyslipidemia associated with type 2 diabetes mellitus ([place]) 05/03/2018  . Elevated LFTs 04/01/2019  . Elevated rheumatoid factor 04/12/2019   04/03/2019-rheumatoid factor-95.4  . Emphysema (subcutaneous) (surgical) resulting from a procedure 05/03/2018  . Essential hypertension 05/03/2018  . Fever   . GAD (generalized anxiety disorder)   . GERD (gastroesophageal reflux disease)   . History of alpha-1-antitrypsin deficiency 04/12/2019  . History of kidney stones   . Hyperlipidemia   . Hypertension   . Hypoxemia   . Insomnia 05/03/2018  . Interstitial pulmonary disease ([place]) 04/12/2019   04/01/2019-CT chest with contrast- no acute process in  chest abdomen or pelvis, interstitial lung disease suspicious for early or mild UIP, pulmonary artery enlargement suggest PAH  04/02/2019-connective tissue work-up: Anti-Jo 1-negative Anti-DNA antibody double-stranded-negative Anti-scleroderma antibody-negative Sjogren's syndrome antibody-negative Sjogren's syndrome antibody-negative CK-31 CCP-6 E  . Leukocytosis 03/08/2020  . Low vitamin B12 level 04/03/2019  . Lower extremity edema   . Medicare annual wellness visit, subsequent 08/09/2020  . Mixed hyperlipidemia 05/03/2018  . Myalgia 04/19/2019  . Osteoarthritis   . Other emphysema ([place])   . Paresthesias in left hand 11/30/2019  . Pneumonia 04/01/2019  . Pneumonitis   . Polymyositis ([place])   . Primary insomnia   . Renal stones   . Sepsis ([place]) 04/19/2019  . Severe sepsis ([place]) 07/07/2020  . Skin cancer   . Transaminitis   . Trigger finger 05/03/2018  . UC (ulcerative colitis) ([place])   . UTI (urinary tract infection)     Past Surgical History:  Procedure Laterality Date  . ANGIOPLASTY    . BACK SURGERY    . CATARACT EXTRACTION    . COLONOSCOPY  06/16/2005   Mild colitis involving splenic flexure. Colonic polyps, status post polypectomy. Mild pancolonic diverticulitits. Internal hemorrhoids.   . ESOPHAGOGASTRODUODENOSCOPY  04/26/2003   Irregular Z line suggestive of GERD. Mild gastritis status post CLO testing.   Marland Kitchen TOTAL KNEE ARTHROPLASTY Left 12/17/2020   Procedure: LEFT TOTAL KNEE ARTHROPLASTY;  Surgeon: Leandrew Koyanagi, MD;  Location: [facility];  Service: Orthopedics;  Laterality: Left;  . TRIGGER FINGER RELEASE  Family History  Problem Relation Age of Onset  . Tuberculosis Mother   . Stroke Father   . Pancreatic cancer Sister   . Heart attack Sister   . Lung disease Sister   . Clotting disorder Brother   . Colon cancer Neg Hx   . Esophageal cancer Neg Hx     Social History   Socioeconomic History  . Marital status: Married    Spouse name: Not on file  . Number of children: 2   . Years of education: Not on file  . Highest education level: Not on file  Occupational History  . Occupation: retired  Tobacco Use  . Smoking status: Never Smoker  . Smokeless tobacco: Never Used  Vaping Use  . Vaping Use: Never used  Substance and Sexual Activity  . Alcohol use: Never  . Drug use: Never  . Sexual activity: Not on file  Other Topics Concern  . Not on file  Social History Narrative  . Not on file   Social Determinants of Health   Financial Resource Strain: Not on file  Food Insecurity: No Food Insecurity  . Worried About Charity fundraiser in the Last Year: Never true  . Ran Out of Food in the Last Year: Never true  Transportation Needs: No Transportation Needs  . Lack of Transportation (Medical): No  . Lack of Transportation (Non-Medical): No  Physical Activity: Sufficiently Active  . Days of Exercise per Week: 5 days  . Minutes of Exercise per Session: 30 min  Stress: Not on file  Social Connections: Not on file  Intimate Partner Violence: Not on file    Outpatient Medications Prior to Visit  Medication Sig Dispense Refill  . aspirin EC 81 MG tablet Take 1 tablet (81 mg total) by mouth 2 (two) times daily. To be taken after surgery (Patient taking differently: Take 81 mg by mouth daily.) 84 tablet 0  . atorvastatin (LIPITOR) 20 MG tablet TAKE 1 TABLET (20 MG TOTAL) BY MOUTH AT BEDTIME. 90 tablet 1  . Blood Glucose Monitoring Suppl (TRUE METRIX METER) DEVI E11.69 Use as directed 1 each 0  . cephALEXin (KEFLEX) 500 MG capsule Take 1 capsule (500 mg total) by mouth 4 (four) times daily. To be taken after surgery 40 capsule 0  . docusate sodium (COLACE) 100 MG capsule Take 1 capsule (100 mg total) by mouth daily as needed. (Patient not taking: No sig reported) 30 capsule 2  . furosemide (LASIX) 20 MG tablet Take 20 mg by mouth daily as needed for fluid.    Marland Kitchen gabapentin (NEURONTIN) 400 MG capsule TAKE 1 CAPSULE THREE TIMES DAILY (Patient taking differently:  Take 400 mg by mouth 2 (two) times daily.) 270 capsule 0  . glucose blood (TRUE METRIX BLOOD GLUCOSE TEST) test strip E11.69 TEST BLOOD SUGAR THREE TIMES DAILY BEFORE MEALS 300 strip 3  . methocarbamol (ROBAXIN) 500 MG tablet Take 1 tablet (500 mg total) by mouth 2 (two) times daily as needed. To be taken after surgery 20 tablet 0  . Multiple Vitamins-Minerals (PRESERVISION AREDS 2) CAPS Take 1 capsule by mouth 2 (two) times daily after a meal.    . nitroGLYCERIN (NITROSTAT) 0.4 MG SL tablet Place 1 tablet (0.4 mg total) under the tongue every 5 (five) minutes as needed for chest pain. 25 tablet 6  . omeprazole (PRILOSEC) 20 MG capsule TAKE 1 CAPSULE EVERY DAY (Patient taking differently: Take 20 mg by mouth daily.) 90 capsule 1  . ondansetron (ZOFRAN) 4  MG tablet Take 1 tablet (4 mg total) by mouth every 8 (eight) hours as needed for nausea or vomiting. 40 tablet 0  . oxyCODONE-acetaminophen (PERCOCET) 5-325 MG tablet Take 1-2 tablets by mouth every 6 (six) hours as needed. To be taken after surgery 40 tablet 0  . riTUXimab (RITUXAN) 100 MG/10ML injection Inject 100 mg into the vein every 6 (six) months.    . vitamin B-12 (CYANOCOBALAMIN) 1000 MCG tablet Take 1 tablet (1,000 mcg total) by mouth daily. (Patient taking differently: Take 1,000 mcg by mouth daily before breakfast.) 30 tablet 0   No facility-administered medications prior to visit.    Allergies  Allergen Reactions  . Ace Inhibitors Other (See Comments)    Slow heart rate  . Hydrocodone Nausea And Vomiting  . Hydrocodone-Acetaminophen Nausea And Vomiting  . Nsaids     Kidney issues  . Sulfamethoxazole-Trimethoprim Nausea And Vomiting    Review of Systems     Objective:    Physical Exam  BP 128/64   Pulse 88   Temp 97.7 F (36.5 C)   Resp 16   Wt 187 lb (84.8 kg)   BMI 29.29 kg/m  Wt Readings from Last 3 Encounters:  01/14/21 187 lb (84.8 kg)  12/17/20 189 lb (85.7 kg)  12/13/20 189 lb (85.7 kg)    Health  Maintenance Due  Topic Date Due  . FOOT EXAM  Never done  . COVID-19 Vaccine (4 - Booster for [place] series) 09/05/2020  . OPHTHALMOLOGY EXAM  12/19/2020  . URINE MICROALBUMIN  03/06/2021    There are no preventive care reminders to display for this patient.   Lab Results  Component Value Date   TSH 2.690 12/01/2019   Lab Results  Component Value Date   WBC 13.7 (H) 12/18/2020   HGB 11.5 (L) 12/18/2020   HCT 34.3 (L) 12/18/2020   MCV 86.2 12/18/2020   PLT 270 12/18/2020   Lab Results  Component Value Date   NA 133 (L) 12/18/2020   K 4.2 12/18/2020   CO2 27 12/18/2020   GLUCOSE 154 (H) 12/18/2020   BUN 13 12/18/2020   CREATININE 1.35 (H) 12/18/2020   BILITOT 0.5 12/13/2020   ALKPHOS 140 (H) 12/13/2020   AST 25 12/13/2020   ALT 26 12/13/2020   PROT 6.4 (L) 12/13/2020   ALBUMIN 3.6 12/13/2020   CALCIUM 8.3 (L) 12/18/2020   ANIONGAP 6 12/18/2020   EGFR 58 (L) 11/13/2020   Lab Results  Component Value Date   CHOL 168 04/04/2020   Lab Results  Component Value Date   HDL 43 04/04/2020   Lab Results  Component Value Date   LDLCALC 111 (H) 04/04/2020   Lab Results  Component Value Date   TRIG 76 04/04/2020   Lab Results  Component Value Date   CHOLHDL 3.9 04/04/2020   Lab Results  Component Value Date   HGBA1C 6.3 (H) 12/17/2020       Assessment & Plan:  1. Dysuria - POCT urinalysis dipstick    No orders of the defined types were placed in this encounter.   Orders Placed This Encounter  Procedures  . POCT urinalysis dipstick     I spent < time > minutes dedicated to the care of this patient on the date of this encounter to include face-to-face time with the patient, as well as: ***  Follow-up: No follow-ups on file.  An After Visit Summary was printed and given to the patient.  [name], MD [facility] [phone]

## 2021-01-14 NOTE — Progress Notes (Signed)
Acute Office Visit  Subjective:    Patient ID: Kerry Matthews, male    DOB: 1936-05-03, 85 y.o.   MRN: [id number]  Chief Complaint  Patient presents with  . Dysuria    HPI Patient is in today for dysuria. I spoke with patient's wife on Saturday and he was having dysuria and increase frequency. Pt has had recurrent UTIs that have led to urosepsis. I called in keflex 500 mg one twice a day at that time. His symptoms have improved, but is still having some dysuria.   Past Medical History:  Diagnosis Date  . 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 Abnormal CT of the chest   . Abnormal findings on diagnostic imaging of lung 04/12/2019   04/01/2019-CT chest with contrast- no acute process in chest abdomen or pelvis, interstitial lung disease suspicious for early or mild UIP, pulmonary artery enlargement suggest PAH   . Acute low back pain without sciatica 03/08/2020  . Acute lower UTI 07/07/2020  . Acute pain of left knee 11/30/2019  . AKI (acute kidney injury) ([place])   . Alpha-1-antitrypsin deficiency carrier 05/04/2019  . Anemia   . Atherosclerotic heart disease of native coronary artery without angina pectoris 05/03/2018  . Bradycardia   . CKD (chronic kidney disease) 05/03/2018  . Community acquired pneumonia   . Coronary artery disease   . Cough   . Diabetes ([place])   . Diabetes mellitus type 2 in obese ([place]) 04/01/2019  . Dyslipidemia associated with type 2 diabetes mellitus ([place]) 05/03/2018  . Elevated LFTs 04/01/2019  . Elevated rheumatoid factor 04/12/2019   04/03/2019-rheumatoid factor-95.4  . Emphysema (subcutaneous) (surgical) resulting from a procedure 05/03/2018  . Essential hypertension 05/03/2018  . Fever   . GAD (generalized anxiety disorder)   . GERD (gastroesophageal reflux disease)   .  History of alpha-1-antitrypsin deficiency 04/12/2019  . History of kidney stones   . Hyperlipidemia   . Hypertension   . Hypoxemia   . Insomnia 05/03/2018  . Interstitial pulmonary disease ([place]) 04/12/2019   04/01/2019-CT chest with contrast- no acute process in chest abdomen or pelvis, interstitial lung disease suspicious for early or mild UIP, pulmonary artery enlargement suggest PAH  04/02/2019-connective tissue work-up: Anti-Jo 1-negative Anti-DNA antibody double-stranded-negative Anti-scleroderma antibody-negative Sjogren's syndrome antibody-negative Sjogren's syndrome antibody-negative CK-31 CCP-6 E  . Leukocytosis 03/08/2020  . Low vitamin B12 level 04/03/2019  . Lower extremity edema   . Medicare annual wellness visit, subsequent 08/09/2020  . Mixed hyperlipidemia 05/03/2018  . Myalgia 04/19/2019  . Osteoarthritis   . Other emphysema ([place])   . Paresthesias in left hand 11/30/2019  . Pneumonia 04/01/2019  . Pneumonitis   . Polymyositis ([place])   . Primary insomnia   . Renal stones   . Sepsis ([place]) 04/19/2019  . Severe sepsis ([place]) 07/07/2020  . Skin cancer   . Transaminitis   . Trigger finger 05/03/2018  . UC (ulcerative colitis) ([place])   . UTI (urinary tract infection)     Past Surgical History:  Procedure Laterality Date  . ANGIOPLASTY    . BACK SURGERY    . CATARACT EXTRACTION    . COLONOSCOPY  06/16/2005   Mild colitis involving splenic flexure. Colonic polyps, status post polypectomy. Mild pancolonic diverticulitits. Internal hemorrhoids.   . ESOPHAGOGASTRODUODENOSCOPY  04/26/2003   Irregular Z  line suggestive of GERD. Mild gastritis status post CLO testing.   Marland Kitchen TOTAL KNEE ARTHROPLASTY Left 12/17/2020   Procedure: LEFT TOTAL KNEE ARTHROPLASTY;  Surgeon: Leandrew Koyanagi, MD;  Location: [facility];  Service: Orthopedics;  Laterality: Left;  . TRIGGER FINGER RELEASE      Family History  Problem Relation Age of Onset  . Tuberculosis Mother   . Stroke Father   . Pancreatic cancer Sister   .  Heart attack Sister   . Lung disease Sister   . Clotting disorder Brother   . Colon cancer Neg Hx   . Esophageal cancer Neg Hx     Social History   Socioeconomic History  . Marital status: Married    Spouse name: Not on file  . Number of children: 2  . Years of education: Not on file  . Highest education level: Not on file  Occupational History  . Occupation: retired  Tobacco Use  . Smoking status: Never Smoker  . Smokeless tobacco: Never Used  Vaping Use  . Vaping Use: Never used  Substance and Sexual Activity  . Alcohol use: Never  . Drug use: Never  . Sexual activity: Not on file  Other Topics Concern  . Not on file  Social History Narrative  . Not on file   Social Determinants of Health   Financial Resource Strain: Not on file  Food Insecurity: No Food Insecurity  . Worried About Charity fundraiser in the Last Year: Never true  . Ran Out of Food in the Last Year: Never true  Transportation Needs: No Transportation Needs  . Lack of Transportation (Medical): No  . Lack of Transportation (Non-Medical): No  Physical Activity: Sufficiently Active  . Days of Exercise per Week: 5 days  . Minutes of Exercise per Session: 30 min  Stress: Not on file  Social Connections: Not on file  Intimate Partner Violence: Not on file    Outpatient Medications Prior to Visit  Medication Sig Dispense Refill  . aspirin EC 81 MG tablet Take 1 tablet (81 mg total) by mouth 2 (two) times daily. To be taken after surgery (Patient taking differently: Take 81 mg by mouth daily.) 84 tablet 0  . atorvastatin (LIPITOR) 20 MG tablet TAKE 1 TABLET (20 MG TOTAL) BY MOUTH AT BEDTIME. 90 tablet 1  . Blood Glucose Monitoring Suppl (TRUE METRIX METER) DEVI E11.69 Use as directed 1 each 0  . cephALEXin (KEFLEX) 500 MG capsule Take 1 capsule (500 mg total) by mouth 4 (four) times daily. To be taken after surgery 40 capsule 0  . docusate sodium (COLACE) 100 MG capsule Take 1 capsule (100 mg total) by  mouth daily as needed. (Patient not taking: No sig reported) 30 capsule 2  . furosemide (LASIX) 20 MG tablet Take 20 mg by mouth daily as needed for fluid.    Marland Kitchen gabapentin (NEURONTIN) 400 MG capsule TAKE 1 CAPSULE THREE TIMES DAILY (Patient taking differently: Take 400 mg by mouth 2 (two) times daily.) 270 capsule 0  . glucose blood (TRUE METRIX BLOOD GLUCOSE TEST) test strip E11.69 TEST BLOOD SUGAR THREE TIMES DAILY BEFORE MEALS 300 strip 3  . methocarbamol (ROBAXIN) 500 MG tablet Take 1 tablet (500 mg total) by mouth 2 (two) times daily as needed. To be taken after surgery 20 tablet 0  . Multiple Vitamins-Minerals (PRESERVISION AREDS 2) CAPS Take 1 capsule by mouth 2 (two) times daily after a meal.    . nitroGLYCERIN (NITROSTAT) 0.4 MG SL  tablet Place 1 tablet (0.4 mg total) under the tongue every 5 (five) minutes as needed for chest pain. 25 tablet 6  . omeprazole (PRILOSEC) 20 MG capsule TAKE 1 CAPSULE EVERY DAY (Patient taking differently: Take 20 mg by mouth daily.) 90 capsule 1  . ondansetron (ZOFRAN) 4 MG tablet Take 1 tablet (4 mg total) by mouth every 8 (eight) hours as needed for nausea or vomiting. 40 tablet 0  . oxyCODONE-acetaminophen (PERCOCET) 5-325 MG tablet Take 1-2 tablets by mouth every 6 (six) hours as needed. To be taken after surgery 40 tablet 0  . riTUXimab (RITUXAN) 100 MG/10ML injection Inject 100 mg into the vein every 6 (six) months.    . vitamin B-12 (CYANOCOBALAMIN) 1000 MCG tablet Take 1 tablet (1,000 mcg total) by mouth daily. (Patient taking differently: Take 1,000 mcg by mouth daily before breakfast.) 30 tablet 0   No facility-administered medications prior to visit.    Allergies  Allergen Reactions  . Ace Inhibitors Other (See Comments)    Slow heart rate  . Hydrocodone Nausea And Vomiting  . Hydrocodone-Acetaminophen Nausea And Vomiting  . Nsaids     Kidney issues  . Sulfamethoxazole-Trimethoprim Nausea And Vomiting    Review of Systems  Constitutional:  Negative for chills and fever.  HENT: Negative for congestion, rhinorrhea and sore throat.   Respiratory: Negative for cough and shortness of breath.   Cardiovascular: Negative for chest pain and palpitations.  Gastrointestinal: Negative for abdominal pain, constipation, diarrhea, nausea and vomiting.  Genitourinary: Positive for dysuria and urgency.  Musculoskeletal: Negative for arthralgias, back pain and myalgias.  Neurological: Positive for headaches. Negative for dizziness.  Psychiatric/Behavioral: Negative for dysphoric mood. The patient is not nervous/anxious.        Objective:    Physical Exam Vitals reviewed.  Constitutional:      Appearance: Normal appearance.  Cardiovascular:     Rate and Rhythm: Normal rate and regular rhythm.     Heart sounds: Normal heart sounds.  Pulmonary:     Breath sounds: Normal breath sounds.  Abdominal:     Tenderness: There is no abdominal tenderness.  Skin:    Comments: Incision on left knee s/p TKR is healing well. Knee is swollen appropriate for post op.   Neurological:     Mental Status: He is alert.     BP 128/64   Pulse 88   Temp 97.7 F (36.5 C)   Resp 16   Ht '5\' 7"'  (1.702 m)   Wt 187 lb (84.8 kg)   BMI 29.29 kg/m  Wt Readings from Last 3 Encounters:  01/14/21 187 lb (84.8 kg)  12/17/20 189 lb (85.7 kg)  12/13/20 189 lb (85.7 kg)    Health Maintenance Due  Topic Date Due  . FOOT EXAM  Never done  . COVID-19 Vaccine (4 - Booster for Fowler series) 09/05/2020  . OPHTHALMOLOGY EXAM  12/19/2020  . URINE MICROALBUMIN  03/06/2021    There are no preventive care reminders to display for this patient.   Lab Results  Component Value Date   TSH 2.690 12/01/2019   Lab Results  Component Value Date   WBC 13.7 (H) 12/18/2020   HGB 11.5 (L) 12/18/2020   HCT 34.3 (L) 12/18/2020   MCV 86.2 12/18/2020   PLT 270 12/18/2020   Lab Results  Component Value Date   NA 133 (L) 12/18/2020   K 4.2 12/18/2020   CO2 27  12/18/2020   GLUCOSE 154 (H) 12/18/2020   BUN  13 12/18/2020   CREATININE 1.35 (H) 12/18/2020   BILITOT 0.5 12/13/2020   ALKPHOS 140 (H) 12/13/2020   AST 25 12/13/2020   ALT 26 12/13/2020   PROT 6.4 (L) 12/13/2020   ALBUMIN 3.6 12/13/2020   CALCIUM 8.3 (L) 12/18/2020   ANIONGAP 6 12/18/2020   EGFR 58 (L) 11/13/2020   Lab Results  Component Value Date   CHOL 168 04/04/2020   Lab Results  Component Value Date   HDL 43 04/04/2020   Lab Results  Component Value Date   LDLCALC 111 (H) 04/04/2020   Lab Results  Component Value Date   TRIG 76 04/04/2020   Lab Results  Component Value Date   CHOLHDL 3.9 04/04/2020   Lab Results  Component Value Date   HGBA1C 6.3 (H) 12/17/2020       Assessment & Plan:  1. Dysuria: likely partially treated UTI.  - POCT urinalysis dipstick normal.  Sent for urine culture.  Complete keflex.  Sent pyridium.     Follow-up: No follow-ups on file.  An After Visit Summary was printed and given to the patient.  [name], MD [facility] [phone]

## 2021-01-15 ENCOUNTER — Ambulatory Visit: Payer: Medicare HMO | Admitting: Family Medicine

## 2021-01-17 ENCOUNTER — Telehealth: Payer: Self-pay | Admitting: *Deleted

## 2021-01-17 DIAGNOSIS — M25562 Pain in left knee: Secondary | ICD-10-CM | POA: Diagnosis not present

## 2021-01-17 DIAGNOSIS — M1712 Unilateral primary osteoarthritis, left knee: Secondary | ICD-10-CM | POA: Diagnosis not present

## 2021-01-17 DIAGNOSIS — Z96652 Presence of left artificial knee joint: Secondary | ICD-10-CM | POA: Diagnosis not present

## 2021-01-17 NOTE — Telephone Encounter (Signed)
30 day Ortho bundle call completed.

## 2021-01-22 DIAGNOSIS — M1712 Unilateral primary osteoarthritis, left knee: Secondary | ICD-10-CM | POA: Diagnosis not present

## 2021-01-22 DIAGNOSIS — M25562 Pain in left knee: Secondary | ICD-10-CM | POA: Diagnosis not present

## 2021-01-22 DIAGNOSIS — Z96652 Presence of left artificial knee joint: Secondary | ICD-10-CM | POA: Diagnosis not present

## 2021-01-25 ENCOUNTER — Telehealth: Payer: Self-pay

## 2021-01-25 DIAGNOSIS — M1712 Unilateral primary osteoarthritis, left knee: Secondary | ICD-10-CM | POA: Diagnosis not present

## 2021-01-25 DIAGNOSIS — Z96652 Presence of left artificial knee joint: Secondary | ICD-10-CM | POA: Diagnosis not present

## 2021-01-25 DIAGNOSIS — M25562 Pain in left knee: Secondary | ICD-10-CM | POA: Diagnosis not present

## 2021-01-25 NOTE — Progress Notes (Signed)
Chronic Care Management Pharmacy Assistant   Name: Kerry Matthews  MRN: [id number] DOB: 05/26/1936  Reason for Encounter: Disease State for diabetes   Recent office visits:  01/25/21-physical therapy for knee replacement  01/14/21-Dr Cox PCP, Dysuria, start Gabapentin BID, start Phenazopyridine TID as needed. Stopped Atorvastatin and Docusate.   01/01/21-Orthopedic, post op left knee replacement. Kerry Matthews is 2-week status post left total knee replacement.  He has already completed home health PT and started outpatient PT.  He is using a rolling walker and takes oxycodone at nighttime.  Overall he is doing well has no complaints.   Recent consult visits:  none  Hospital visits:  12/17/20- left knee replacement   Medications: Outpatient Encounter Medications as of 01/25/2021  Medication Sig Note  . aspirin EC 81 MG tablet Take 1 tablet (81 mg total) by mouth 2 (two) times daily. To be taken after surgery (Patient taking differently: Take 81 mg by mouth daily.)   . Blood Glucose Monitoring Suppl (TRUE METRIX METER) DEVI E11.69 Use as directed   . cephALEXin (KEFLEX) 500 MG capsule Take by mouth 2 (two) times daily. Take 1 twice daily for 7 days   . furosemide (LASIX) 20 MG tablet Take 20 mg by mouth daily as needed for fluid.   Marland Kitchen gabapentin (NEURONTIN) 400 MG capsule Take 1 capsule (400 mg total) by mouth 2 (two) times daily.   Marland Kitchen glucose blood (TRUE METRIX BLOOD GLUCOSE TEST) test strip E11.69 TEST BLOOD SUGAR THREE TIMES DAILY BEFORE MEALS   . methocarbamol (ROBAXIN) 500 MG tablet Take 1 tablet (500 mg total) by mouth 2 (two) times daily as needed. To be taken after surgery 12/06/2020: Has not started yet  . Multiple Vitamins-Minerals (PRESERVISION AREDS 2) CAPS Take 1 capsule by mouth 2 (two) times daily after a meal.   . nitroGLYCERIN (NITROSTAT) 0.4 MG SL tablet Place 1 tablet (0.4 mg total) under the tongue every 5 (five) minutes as needed for chest pain.   Marland Kitchen omeprazole (PRILOSEC) 20 MG  capsule TAKE 1 CAPSULE EVERY DAY (Patient taking differently: Take 20 mg by mouth daily.)   . ondansetron (ZOFRAN) 4 MG tablet Take 1 tablet (4 mg total) by mouth every 8 (eight) hours as needed for nausea or vomiting. 12/06/2020: Has not started yet  . oxyCODONE-acetaminophen (PERCOCET) 5-325 MG tablet Take 1-2 tablets by mouth every 6 (six) hours as needed. To be taken after surgery 12/06/2020: Has not started yet  . phenazopyridine (PYRIDIUM) 200 MG tablet Take 1 tablet (200 mg total) by mouth 3 (three) times daily as needed for pain.   Marland Kitchen riTUXimab (RITUXAN) 100 MG/10ML injection Inject 100 mg into the vein every 6 (six) months.   . vitamin B-12 (CYANOCOBALAMIN) 1000 MCG tablet Take 1 tablet (1,000 mcg total) by mouth daily. (Patient taking differently: Take 1,000 mcg by mouth daily before breakfast.)    No facility-administered encounter medications on file as of 01/25/2021.    Recent Relevant Labs: Lab Results  Component Value Date/Time   HGBA1C 6.3 (H) 12/17/2020 01:35 PM   HGBA1C 6.2 (H) 10/02/2020 11:03 AM   MICROALBUR 10 03/06/2020 03:07 PM    Kidney Function Lab Results  Component Value Date/Time   CREATININE 1.35 (H) 12/18/2020 06:03 AM   CREATININE 1.14 12/13/2020 12:05 PM   GFRNONAA 52 (L) 12/18/2020 06:03 AM   GFRAA 55 (L) 07/26/2020 09:53 AM     . Current antihyperglycemic regimen:  Diet and Lifestyle     . What recent interventions/DTPs  have been made to improve glycemic control:  Patient is watching his diet, he is currently taking physical therapy for his knee, he stated he hasn't been using his cane as much and is doing well  . Have there been any recent hospitalizations or ED visits since last visit with CPP? No  . Patient denies hypoglycemic symptoms, including None  . Patient denies hyperglycemic symptoms, including none  . How often are you checking your blood sugar? once daily  . What are your blood sugars ranging?  Patient stated his blood sugar today was  128, yesterday it was 129.  He said this is where it normally stays.   . On insulin? No  . During the week, how often does your blood glucose drop below 70? Never  . Are you checking your feet daily/regularly? Yes, patient does check his feet  Adherence Review: Is the patient currently on a STATIN medication? No Is the patient currently on ACE/ARB medication? No Does the patient have >5 day gap between last estimated fill dates? CPP to review  Star Rating Drugs:  Medication:  Last Fill: Day Supply None listed, Atorvastatin was stopped  [name], [facility] Pharmacist Assistant [phone]

## 2021-01-28 DIAGNOSIS — Z96652 Presence of left artificial knee joint: Secondary | ICD-10-CM | POA: Diagnosis not present

## 2021-01-28 DIAGNOSIS — M1712 Unilateral primary osteoarthritis, left knee: Secondary | ICD-10-CM | POA: Diagnosis not present

## 2021-01-28 DIAGNOSIS — M25562 Pain in left knee: Secondary | ICD-10-CM | POA: Diagnosis not present

## 2021-01-29 ENCOUNTER — Encounter: Payer: Self-pay | Admitting: Orthopaedic Surgery

## 2021-01-29 ENCOUNTER — Ambulatory Visit (INDEPENDENT_AMBULATORY_CARE_PROVIDER_SITE_OTHER): Payer: Medicare HMO

## 2021-01-29 ENCOUNTER — Ambulatory Visit (INDEPENDENT_AMBULATORY_CARE_PROVIDER_SITE_OTHER): Payer: Medicare HMO | Admitting: Orthopaedic Surgery

## 2021-01-29 DIAGNOSIS — Z96652 Presence of left artificial knee joint: Secondary | ICD-10-CM | POA: Diagnosis not present

## 2021-01-29 DIAGNOSIS — M317 Microscopic polyangiitis: Secondary | ICD-10-CM | POA: Diagnosis not present

## 2021-01-29 MED ORDER — CEPHALEXIN 500 MG PO CAPS: 500.0000 mg | ORAL_CAPSULE | Freq: Four times a day (QID) | ORAL | 0 refills | Status: DC

## 2021-01-29 NOTE — Progress Notes (Signed)
Post-Op Visit Note   Patient: Kerry Matthews           Date of Birth: Jan 19, 1936           MRN: [id number] Visit Date: 01/29/2021 PCP: [name], MD   Assessment & Plan:  Chief Complaint:  Chief Complaint  Patient presents with  . Left Knee - Pain   Visit Diagnoses:  1. Status post left knee replacement     Plan:   [name] is 6 weeks status post left total knee replacement.  He has 2 more sessions of outpatient PT in deep [place].  Last session he was able to achieve 112 degrees of flexion and 5 degrees of extension.  Overall doing well.  He has had some redness towards the bottom of the incision that comes and goes.  Denies any constitutional symptoms.  Left knee shows a fully healed surgical scar.  There is erythema towards the inferior portion of the surgical scar.  Questionable cellulitis versus dependent edema.  Range of motion is 5-112.  X-rays are unremarkable.  I will send in Keflex x 10 days to be safe.  I do not truly believe that this is an infection.  He will continue to work on range of motion and extension with the 0 degree bone foam.  He can essentially just do his own exercises at this point.  We will recheck him in 6 weeks.  Dental prophylaxis was reinforced.  Follow-Up Instructions: Return in about 6 weeks (around 03/12/2021).   Orders:  Orders Placed This Encounter  Procedures  . XR Knee 1-2 Views Left   Meds ordered this encounter  Medications  . cephALEXin (KEFLEX) 500 MG capsule    Sig: Take 1 capsule (500 mg total) by mouth 4 (four) times daily.    Dispense:  40 capsule    Refill:  0    Imaging: XR Knee 1-2 Views Left  Result Date: 01/29/2021 Stable total knee replacement in good alignment.    PMFS History: Patient Active Problem List   Diagnosis Date Noted  . 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/17/2020  . Hematuria 09/07/2020  . Proteinuria 09/07/2020  . Dysuria 09/07/2020  . Urinary urgency 09/07/2020  . Renal insufficiency  09/06/2020  . Coronary artery disease   . Diabetes ([place])   . GAD (generalized anxiety disorder)   . Hyperlipidemia   . Osteoarthritis   . Other emphysema ([place])   . Primary insomnia   . Renal stones   . Skin cancer   . UC (ulcerative colitis) ([place])   . UTI (urinary tract infection)   . Medicare annual wellness visit, subsequent 08/09/2020  . Severe sepsis ([place]) 07/07/2020  . Acute lower UTI 07/07/2020  . Leukocytosis 03/08/2020  . Acute low back pain without sciatica 03/08/2020  . Peripheral polyneuropathy 01/30/2020  . Body mass index (BMI) 29.0-29.9, adult 01/05/2020  . Burning sensation of feet 01/05/2020  . Acute pain of left knee 11/30/2019  . Paresthesias in left hand 11/30/2019  . Alpha-1-antitrypsin deficiency carrier 05/04/2019  . Sepsis ([place]) 04/19/2019  . Myalgia 04/19/2019  . History of alpha-1-antitrypsin deficiency 04/12/2019  . Elevated rheumatoid factor 04/12/2019  . Interstitial pulmonary disease ([place]) 04/12/2019  . Abnormal ANCA test 04/12/2019  . Abnormal findings on diagnostic imaging of lung 04/12/2019  . Transaminitis   . Hypoxemia   . Polymyositis ([place])   . Low vitamin B12 level 04/03/2019  . Community acquired pneumonia   . Pneumonitis   . Cough   .  Fever   . Abnormal CT of the chest   . Lower extremity edema   . AKI (acute kidney injury) ([place])   . Anemia   . Pneumonia 04/01/2019  . Elevated LFTs 04/01/2019  . Diabetes mellitus type 2 in obese ([place]) 04/01/2019  . Dyslipidemia associated with type 2 diabetes mellitus ([place]) 05/03/2018  . Essential hypertension 05/03/2018  . Mixed hyperlipidemia 05/03/2018  . Atherosclerotic heart disease of native coronary artery without angina pectoris 05/03/2018  . GERD (gastroesophageal reflux disease) 05/03/2018  . Trigger finger 05/03/2018  . Bradycardia 05/03/2018  . CKD (chronic kidney disease) 05/03/2018  . Insomnia 05/03/2018  . Emphysema (subcutaneous) (surgical) resulting from a procedure  05/03/2018   Past Medical History:  Diagnosis Date  . 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 Abnormal CT of the chest   . Abnormal findings on diagnostic imaging of lung 04/12/2019   04/01/2019-CT chest with contrast- no acute process in chest abdomen or pelvis, interstitial lung disease suspicious for early or mild UIP, pulmonary artery enlargement suggest PAH   . Acute low back pain without sciatica 03/08/2020  . Acute lower UTI 07/07/2020  . Acute pain of left knee 11/30/2019  . AKI (acute kidney injury) ([place])   . Alpha-1-antitrypsin deficiency carrier 05/04/2019  . Anemia   . Atherosclerotic heart disease of native coronary artery without angina pectoris 05/03/2018  . Bradycardia   . CKD (chronic kidney disease) 05/03/2018  . Community acquired pneumonia   . Coronary artery disease   . Cough   . Diabetes ([place])   . Diabetes mellitus type 2 in obese ([place]) 04/01/2019  . Dyslipidemia associated with type 2 diabetes mellitus ([place]) 05/03/2018  . Elevated LFTs 04/01/2019  . Elevated rheumatoid factor 04/12/2019   04/03/2019-rheumatoid factor-95.4  . Emphysema (subcutaneous) (surgical) resulting from a procedure 05/03/2018  . Essential hypertension 05/03/2018  . Fever   . GAD (generalized anxiety disorder)   . GERD (gastroesophageal reflux disease)   . History of alpha-1-antitrypsin deficiency 04/12/2019  . History of kidney stones   . Hyperlipidemia   . Hypertension   . Hypoxemia   . Insomnia 05/03/2018  . Interstitial pulmonary disease ([place]) 04/12/2019   04/01/2019-CT chest with contrast- no acute process in chest abdomen or pelvis, interstitial lung disease suspicious for early or mild UIP, pulmonary artery enlargement suggest PAH  04/02/2019-connective tissue work-up: Anti-Jo 1-negative Anti-DNA antibody  double-stranded-negative Anti-scleroderma antibody-negative Sjogren's syndrome antibody-negative Sjogren's syndrome antibody-negative CK-31 CCP-6 E  . Leukocytosis 03/08/2020  . Low vitamin B12 level 04/03/2019  . Lower extremity edema   . Medicare annual wellness visit, subsequent 08/09/2020  . Mixed hyperlipidemia 05/03/2018  . Myalgia 04/19/2019  . Osteoarthritis   . Other emphysema ([place])   . Paresthesias in left hand 11/30/2019  . Pneumonia 04/01/2019  . Pneumonitis   . Polymyositis ([place])   . Primary insomnia   . Renal stones   . Sepsis ([place]) 04/19/2019  . Severe sepsis ([place]) 07/07/2020  . Skin cancer   . Transaminitis   . Trigger finger 05/03/2018  . UC (ulcerative colitis) ([place])   . UTI (urinary tract infection)     Family History  Problem Relation Age of Onset  . Tuberculosis Mother   . Stroke Father   . Pancreatic cancer Sister   . Heart attack Sister   .  Lung disease Sister   . Clotting disorder Brother   . Colon cancer Neg Hx   . Esophageal cancer Neg Hx     Past Surgical History:  Procedure Laterality Date  . ANGIOPLASTY    . BACK SURGERY    . CATARACT EXTRACTION    . COLONOSCOPY  06/16/2005   Mild colitis involving splenic flexure. Colonic polyps, status post polypectomy. Mild pancolonic diverticulitits. Internal hemorrhoids.   . ESOPHAGOGASTRODUODENOSCOPY  04/26/2003   Irregular Z line suggestive of GERD. Mild gastritis status post CLO testing.   Marland Kitchen TOTAL KNEE ARTHROPLASTY Left 12/17/2020   Procedure: LEFT TOTAL KNEE ARTHROPLASTY;  Surgeon: Leandrew Koyanagi, MD;  Location: [facility];  Service: Orthopedics;  Laterality: Left;  . TRIGGER FINGER RELEASE     Social History   Occupational History  . Occupation: retired  Tobacco Use  . Smoking status: Never Smoker  . Smokeless tobacco: Never Used  Vaping Use  . Vaping Use: Never used  Substance and Sexual Activity  . Alcohol use: Never  . Drug use: Never  . Sexual activity: Not on file

## 2021-01-31 DIAGNOSIS — M25562 Pain in left knee: Secondary | ICD-10-CM | POA: Diagnosis not present

## 2021-01-31 DIAGNOSIS — Z96652 Presence of left artificial knee joint: Secondary | ICD-10-CM | POA: Diagnosis not present

## 2021-01-31 DIAGNOSIS — M1712 Unilateral primary osteoarthritis, left knee: Secondary | ICD-10-CM | POA: Diagnosis not present

## 2021-02-04 DIAGNOSIS — Z96652 Presence of left artificial knee joint: Secondary | ICD-10-CM | POA: Diagnosis not present

## 2021-02-04 DIAGNOSIS — M1712 Unilateral primary osteoarthritis, left knee: Secondary | ICD-10-CM | POA: Diagnosis not present

## 2021-02-04 DIAGNOSIS — M25562 Pain in left knee: Secondary | ICD-10-CM | POA: Diagnosis not present

## 2021-02-06 ENCOUNTER — Other Ambulatory Visit: Payer: Self-pay | Admitting: Family Medicine

## 2021-02-27 ENCOUNTER — Telehealth: Payer: Self-pay | Admitting: Orthopaedic Surgery

## 2021-02-27 ENCOUNTER — Other Ambulatory Visit: Payer: Self-pay | Admitting: Physician Assistant

## 2021-02-27 MED ORDER — AMOXICILLIN 500 MG PO CAPS: ORAL_CAPSULE | ORAL | 2 refills | Status: DC

## 2021-02-27 NOTE — Telephone Encounter (Signed)
Sent in

## 2021-02-27 NOTE — Telephone Encounter (Signed)
Pt called stating he has to get a cap on his tooth but will require an antibiotic; pt would like to have this called in to his [facility] in [place]. Pt would also like to be notified when this has been sent in.   [phone]

## 2021-02-27 NOTE — Telephone Encounter (Signed)
Patient aware.

## 2021-03-12 ENCOUNTER — Encounter: Payer: Medicare HMO | Admitting: Orthopaedic Surgery

## 2021-03-13 ENCOUNTER — Ambulatory Visit: Payer: Medicare HMO | Admitting: Family Medicine

## 2021-03-19 ENCOUNTER — Other Ambulatory Visit: Payer: Self-pay

## 2021-03-19 ENCOUNTER — Ambulatory Visit (INDEPENDENT_AMBULATORY_CARE_PROVIDER_SITE_OTHER): Payer: Medicare HMO | Admitting: Physician Assistant

## 2021-03-19 ENCOUNTER — Telehealth: Payer: Self-pay | Admitting: *Deleted

## 2021-03-19 DIAGNOSIS — Z96652 Presence of left artificial knee joint: Secondary | ICD-10-CM

## 2021-03-19 NOTE — Progress Notes (Signed)
Post-Op Visit Note   Patient: Kerry Matthews           Date of Birth: July 09, 1936           MRN: [id number] Visit Date: 03/19/2021 PCP: [name], MD   Assessment & Plan:  Chief Complaint:  Chief Complaint  Patient presents with   Left Knee - Pain   Visit Diagnoses:  1. Hx of total knee replacement, left     Plan: Patient is a very pleasant 85 year old gentleman who comes in today 3 months out left total knee replacement 12/17/2020.  He has been doing very well.  He still notes some stiffness at times but overall doing great.  Examination of the left knee reveals a fully healed surgical scar without complication.  Range of motion 5 to 120 degrees.  Stable valgus varus stress.  He is neurovascular intact distally.  At this point, he will continue to advance with activity as tolerated.  We have discussed working on extension exercises which she is agreeable to.  Dental prophylaxis reinforced.  Follow-up with [place] in 3 months times for repeat evaluation and 2 view x-rays of the left knee.  Call with concerns or questions.  Follow-Up Instructions: Return in about 3 months (around 06/19/2021).   Orders:  No orders of the defined types were placed in this encounter.  No orders of the defined types were placed in this encounter.   Imaging: No new imaging  PMFS History: Patient Active Problem List   Diagnosis Date Noted   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/17/2020   Hematuria 09/07/2020   Proteinuria 09/07/2020   Dysuria 09/07/2020   Urinary urgency 09/07/2020   Renal insufficiency 09/06/2020   Coronary artery disease    Diabetes ([place])    GAD (generalized anxiety disorder)    Hyperlipidemia    Osteoarthritis    Other emphysema ([place])    Primary insomnia    Renal stones    Skin cancer    UC (ulcerative colitis) ([place])    UTI (urinary tract infection)    Medicare annual wellness visit, subsequent 08/09/2020   Severe sepsis ([place]) 07/07/2020   Acute lower UTI  07/07/2020   Leukocytosis 03/08/2020   Acute low back pain without sciatica 03/08/2020   Peripheral polyneuropathy 01/30/2020   Body mass index (BMI) 29.0-29.9, adult 01/05/2020   Burning sensation of feet 01/05/2020   Acute pain of left knee 11/30/2019   Paresthesias in left hand 11/30/2019   Alpha-1-antitrypsin deficiency carrier 05/04/2019   Sepsis ([place]) 04/19/2019   Myalgia 04/19/2019   History of alpha-1-antitrypsin deficiency 04/12/2019   Elevated rheumatoid factor 04/12/2019   Interstitial pulmonary disease ([place]) 04/12/2019   Abnormal ANCA test 04/12/2019   Abnormal findings on diagnostic imaging of lung 04/12/2019   Transaminitis    Hypoxemia    Polymyositis ([place])    Low vitamin B12 level 04/03/2019   Community acquired pneumonia    Pneumonitis    Cough    Fever    Abnormal CT of the chest    Lower extremity edema    AKI (acute kidney injury) ([place])    Anemia    Pneumonia 04/01/2019   Elevated LFTs 04/01/2019   Diabetes mellitus type 2 in obese ([place]) 04/01/2019   Dyslipidemia associated with type 2 diabetes mellitus ([place]) 05/03/2018   Essential hypertension 05/03/2018   Mixed hyperlipidemia 05/03/2018   Atherosclerotic heart disease of native coronary artery without angina pectoris 05/03/2018   GERD (gastroesophageal reflux disease) 05/03/2018  Trigger finger 05/03/2018   Bradycardia 05/03/2018   CKD (chronic kidney disease) 05/03/2018   Insomnia 05/03/2018   Emphysema (subcutaneous) (surgical) resulting from a procedure 05/03/2018   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  ANCA antibodies- myeloperoxidase ABS-greater than 100, ANCA proteinase 3-less than 3.5 04/02/2019- ANCA titers- p-ANCA +1: 160, C ANCA-less than 1: 20, atypical p-ANCA titer-less than 1: 20   Abnormal CT of the chest    Abnormal findings on diagnostic imaging of lung 04/12/2019   04/01/2019-CT chest with contrast- no acute process in chest abdomen  or pelvis, interstitial lung disease suspicious for early or mild UIP, pulmonary artery enlargement suggest PAH    Acute low back pain without sciatica 03/08/2020   Acute lower UTI 07/07/2020   Acute pain of left knee 11/30/2019   AKI (acute kidney injury) ([place])    Alpha-1-antitrypsin deficiency carrier 05/04/2019   Anemia    Atherosclerotic heart disease of native coronary artery without angina pectoris 05/03/2018   Bradycardia    CKD (chronic kidney disease) 05/03/2018   Community acquired pneumonia    Coronary artery disease    Cough    Diabetes ([place])    Diabetes mellitus type 2 in obese ([place]) 04/01/2019   Dyslipidemia associated with type 2 diabetes mellitus ([place]) 05/03/2018   Elevated LFTs 04/01/2019   Elevated rheumatoid factor 04/12/2019   04/03/2019-rheumatoid factor-95.4   Emphysema (subcutaneous) (surgical) resulting from a procedure 05/03/2018   Essential hypertension 05/03/2018   Fever    GAD (generalized anxiety disorder)    GERD (gastroesophageal reflux disease)    History of alpha-1-antitrypsin deficiency 04/12/2019   History of kidney stones    Hyperlipidemia    Hypertension    Hypoxemia    Insomnia 05/03/2018   Interstitial pulmonary disease ([place]) 04/12/2019   04/01/2019-CT chest with contrast- no acute process in chest abdomen or pelvis, interstitial lung disease suspicious for early or mild UIP, pulmonary artery enlargement suggest PAH  04/02/2019-connective tissue work-up: Anti-Jo 1-negative Anti-DNA antibody double-stranded-negative Anti-scleroderma antibody-negative Sjogren's syndrome antibody-negative Sjogren's syndrome antibody-negative CK-31 CCP-6 E   Leukocytosis 03/08/2020   Low vitamin B12 level 04/03/2019   Lower extremity edema    Medicare annual wellness visit, subsequent 08/09/2020   Mixed hyperlipidemia 05/03/2018   Myalgia 04/19/2019   Osteoarthritis    Other emphysema ([place])    Paresthesias in left hand 11/30/2019   Pneumonia 04/01/2019   Pneumonitis    Polymyositis ([place])     Primary insomnia    Renal stones    Sepsis ([place]) 04/19/2019   Severe sepsis ([place]) 07/07/2020   Skin cancer    Transaminitis    Trigger finger 05/03/2018   UC (ulcerative colitis) ([place])    UTI (urinary tract infection)     Family History  Problem Relation Age of Onset   Tuberculosis Mother    Stroke Father    Pancreatic cancer Sister    Heart attack Sister    Lung disease Sister    Clotting disorder Brother    Colon cancer Neg Hx    Esophageal cancer Neg Hx     Past Surgical History:  Procedure Laterality Date   ANGIOPLASTY     BACK SURGERY     CATARACT EXTRACTION     COLONOSCOPY  06/16/2005   Mild colitis involving splenic flexure. Colonic polyps, status post polypectomy. Mild pancolonic diverticulitits. Internal hemorrhoids.    ESOPHAGOGASTRODUODENOSCOPY  04/26/2003   Irregular Z line suggestive of GERD. Mild  gastritis status post CLO testing.    TOTAL KNEE ARTHROPLASTY Left 12/17/2020   Procedure: LEFT TOTAL KNEE ARTHROPLASTY;  Surgeon: Leandrew Koyanagi, MD;  Location: [facility];  Service: Orthopedics;  Laterality: Left;   TRIGGER FINGER RELEASE     Social History   Occupational History   Occupation: retired  Tobacco Use   Smoking status: Never   Smokeless tobacco: Never  Vaping Use   Vaping Use: Never used  Substance and Sexual Activity   Alcohol use: Never   Drug use: Never   Sexual activity: Not on file

## 2021-03-19 NOTE — Telephone Encounter (Signed)
Ortho bundle 90 day call completed. 

## 2021-03-29 ENCOUNTER — Ambulatory Visit: Payer: Medicare HMO | Admitting: Cardiology

## 2021-03-29 ENCOUNTER — Other Ambulatory Visit: Payer: Self-pay

## 2021-03-29 DIAGNOSIS — D8989 Other specified disorders involving the immune mechanism, not elsewhere classified: Secondary | ICD-10-CM

## 2021-03-29 DIAGNOSIS — D8984 IgG4-related disease: Secondary | ICD-10-CM

## 2021-03-29 DIAGNOSIS — R5383 Other fatigue: Secondary | ICD-10-CM

## 2021-03-29 DIAGNOSIS — M317 Microscopic polyangiitis: Secondary | ICD-10-CM

## 2021-03-29 DIAGNOSIS — J84112 Idiopathic pulmonary fibrosis: Secondary | ICD-10-CM | POA: Insufficient documentation

## 2021-03-29 DIAGNOSIS — I776 Arteritis, unspecified: Secondary | ICD-10-CM

## 2021-03-29 DIAGNOSIS — Z79899 Other long term (current) drug therapy: Secondary | ICD-10-CM

## 2021-03-29 DIAGNOSIS — Z87442 Personal history of urinary calculi: Secondary | ICD-10-CM | POA: Insufficient documentation

## 2021-03-29 DIAGNOSIS — R768 Other specified abnormal immunological findings in serum: Secondary | ICD-10-CM | POA: Insufficient documentation

## 2021-03-29 HISTORY — DX: Idiopathic pulmonary fibrosis: J84.112

## 2021-03-29 HISTORY — DX: IgG4-related disease: D89.84

## 2021-03-29 HISTORY — DX: Arteritis, unspecified: I77.6

## 2021-03-29 HISTORY — DX: Other fatigue: R53.83

## 2021-03-29 HISTORY — DX: Microscopic polyangiitis: M31.7

## 2021-03-29 HISTORY — DX: Other specified abnormal immunological findings in serum: R76.8

## 2021-03-29 HISTORY — DX: Other specified disorders involving the immune mechanism, not elsewhere classified: D89.89

## 2021-03-29 HISTORY — DX: Other long term (current) drug therapy: Z79.899

## 2021-04-01 ENCOUNTER — Telehealth: Payer: Self-pay

## 2021-04-01 NOTE — Chronic Care Management (AMB) (Signed)
Chronic Care Management Pharmacy Assistant   Name: Kerry Matthews  MRN: [id number] DOB: Sep 27, 1935  Reason for Encounter: Hypertension Disease State Call  Recent office visits:  No visits noted  Recent consult visits:  03/19/21- Dwana Melena, PA- C (Orthopedics)- seen for 3 month follow up of left total knee replacement, no medication changes, follow up 3 months 02/27/21- Orders Only- amoxicillin 500 mg single 2000 mg dose for dental work 01/29/21- Frankey Shown, MD (Orthopedics)- seen for 6 weeks status post left total knee replacement,short course Keflex 500 mg x 7 days,  xray completed, follow up 6 weeks  Hospital visits:  None in previous 6 months  Medications: Outpatient Encounter Medications as of 04/01/2021  Medication Sig Note   aspirin EC 81 MG tablet Take 1 tablet (81 mg total) by mouth 2 (two) times daily. To be taken after surgery (Patient taking differently: Take 81 mg by mouth daily.)    furosemide (LASIX) 20 MG tablet Take 20 mg by mouth daily as needed for fluid.    gabapentin (NEURONTIN) 400 MG capsule Take 400 mg by mouth 3 (three) times daily.    glucose blood (TRUE METRIX BLOOD GLUCOSE TEST) test strip E11.69 TEST BLOOD SUGAR THREE TIMES DAILY BEFORE MEALS    Multiple Vitamins-Minerals (PRESERVISION AREDS 2) CAPS Take 1 capsule by mouth 2 (two) times daily after a meal.    nitroGLYCERIN (NITROSTAT) 0.4 MG SL tablet Place 1 tablet (0.4 mg total) under the tongue every 5 (five) minutes as needed for chest pain.    omeprazole (PRILOSEC) 20 MG capsule Take 20 mg by mouth daily.    ondansetron (ZOFRAN) 4 MG tablet Take 1 tablet (4 mg total) by mouth every 8 (eight) hours as needed for nausea or vomiting. 12/06/2020: Has not started yet   phenazopyridine (PYRIDIUM) 200 MG tablet Take 1 tablet (200 mg total) by mouth 3 (three) times daily as needed for pain.    riTUXimab (RITUXAN) 100 MG/10ML injection Inject 100 mg into the vein every 6 (six) months.    vitamin B-12  (CYANOCOBALAMIN) 1000 MCG tablet Take 1 tablet (1,000 mcg total) by mouth daily. (Patient taking differently: Take 1,000 mcg by mouth daily before breakfast.)    No facility-administered encounter medications on file as of 04/01/2021.    Recent Office Vitals: BP Readings from Last 3 Encounters:  01/14/21 128/64  12/18/20 (!) 151/85  12/13/20 (!) 168/65   Pulse Readings from Last 3 Encounters:  01/14/21 88  12/18/20 (!) 49  12/13/20 (!) 55    Wt Readings from Last 3 Encounters:  01/14/21 187 lb (84.8 kg)  12/17/20 189 lb (85.7 kg)  12/13/20 189 lb (85.7 kg)     Kidney Function Lab Results  Component Value Date/Time   CREATININE 1.35 (H) 12/18/2020 06:03 AM   CREATININE 1.14 12/13/2020 12:05 PM   GFRNONAA 52 (L) 12/18/2020 06:03 AM   GFRAA 55 (L) 07/26/2020 09:53 AM    BMP Latest Ref Rng & Units 12/18/2020 12/13/2020 11/13/2020  Glucose 70 - 99 mg/dL 154(H) 119(H) 163(H)  BUN 8 - 23 mg/dL '13 16 20  '$ Creatinine 0.61 - 1.24 mg/dL 1.35(H) 1.14 1.24  BUN/Creat Ratio 10 - 24 - - 16  Sodium 135 - 145 mmol/L 133(L) 137 138  Potassium 3.5 - 5.1 mmol/L 4.2 4.4 4.8  Chloride 98 - 111 mmol/L 100 104 98  CO2 22 - 32 mmol/L '27 24 22  '$ Calcium 8.9 - 10.3 mg/dL 8.3(L) 8.8(L) 9.1   Miscellaneous Notes: Mr.  Himmelberger has been consistent with tracking his BS and denies symptoms associated with hypo/hyperglycemia BS readings: 131,118,120,100,94  Current antihypertensive regimen:  Furosemide 20 mg daily prn fluid- Patient reported he is no longer taking this regularly and has not taken it in more than 4 months. He stated he has some on hand in case of emergencies  How often are you checking your Blood Pressure?  Patient stated he does not check his BP at home anymore and has been feeling good. He denies dizziness, weakness, and sweating.  Current home BP readings: N/A  Wrist or arm cuff: Arm Caffeine intake: Daily cup of coffee Salt intake:Moderate  OTC medications including pseudoephedrine  or NSAIDs? Daily aspirin  What recent interventions/DTPs have been made by any provider to improve Blood Pressure control since last CPP Visit:  No recent interventions  Any recent hospitalizations or ED visits since last visit with CPP? No  What diet changes have been made to improve Blood Pressure Control?  Patient stated he has been eating more vegetables, chicken, corn and occasionally has hamburgers, bacon, eggs, and biscuits  What exercise is being done to improve your Blood Pressure Control?  Patient is currently doing PT after his knee replacement. He stated that his activity level has increased drastically and he is able to do most of the things he would like to do. He is able to do house work and yard work, and even tended to his garden earlier.   Adherence Review: Is the patient currently on ACE/ARB medication? No Does the patient have >5 day gap between last estimated fill dates?  Furosemide 20 mg- Patient reported no longer taking but has a supply on hand from older fill  Care Gaps: Last annual wellness visit? None noted  Star Rating Drugs: No star rating drugs  [facility] CPA, CMA

## 2021-04-02 ENCOUNTER — Encounter: Payer: Self-pay | Admitting: Family Medicine

## 2021-04-02 ENCOUNTER — Ambulatory Visit: Payer: Medicare HMO | Admitting: Cardiology

## 2021-04-02 ENCOUNTER — Encounter: Payer: Self-pay | Admitting: Cardiology

## 2021-04-02 ENCOUNTER — Other Ambulatory Visit: Payer: Self-pay

## 2021-04-02 VITALS — BP 134/62 | HR 60 | Ht 68.0 in | Wt 189.6 lb

## 2021-04-02 DIAGNOSIS — Z961 Presence of intraocular lens: Secondary | ICD-10-CM | POA: Diagnosis not present

## 2021-04-02 DIAGNOSIS — R5383 Other fatigue: Secondary | ICD-10-CM | POA: Diagnosis not present

## 2021-04-02 DIAGNOSIS — I2510 Atherosclerotic heart disease of native coronary artery without angina pectoris: Secondary | ICD-10-CM | POA: Diagnosis not present

## 2021-04-02 DIAGNOSIS — E782 Mixed hyperlipidemia: Secondary | ICD-10-CM

## 2021-04-02 DIAGNOSIS — E119 Type 2 diabetes mellitus without complications: Secondary | ICD-10-CM | POA: Diagnosis not present

## 2021-04-02 DIAGNOSIS — E1169 Type 2 diabetes mellitus with other specified complication: Secondary | ICD-10-CM | POA: Diagnosis not present

## 2021-04-02 DIAGNOSIS — E669 Obesity, unspecified: Secondary | ICD-10-CM

## 2021-04-02 DIAGNOSIS — H524 Presbyopia: Secondary | ICD-10-CM | POA: Diagnosis not present

## 2021-04-02 DIAGNOSIS — Z7984 Long term (current) use of oral hypoglycemic drugs: Secondary | ICD-10-CM | POA: Diagnosis not present

## 2021-04-02 DIAGNOSIS — I10 Essential (primary) hypertension: Secondary | ICD-10-CM

## 2021-04-02 DIAGNOSIS — H35013 Changes in retinal vascular appearance, bilateral: Secondary | ICD-10-CM | POA: Diagnosis not present

## 2021-04-02 LAB — HM DIABETES EYE EXAM

## 2021-04-02 NOTE — Progress Notes (Signed)
Cardiology Office Note:    Date:  04/02/2021   ID:  [name], DOB 06-23-1936, MRN [id number]  PCP:  [name], MD  Cardiologist:  Jenean Lindau, MD   Referring MD: [name], MD    ASSESSMENT:    1. Atherosclerosis of native coronary artery of native heart without angina pectoris   2. Essential hypertension   3. Mixed hyperlipidemia    PLAN:    In order of problems listed above:  Coronary artery disease: Secondary prevention stressed with the patient.  Importance of compliance with diet medication stressed any vocalized understanding. Essential hypertension: Blood pressure stable and diet was emphasized.  Lifestyle modification urged. Mixed dyslipidemia: Lipids reviewed he is on statin therapy and I discussed results with him.  He will be back in the next few days for complete blood work. Diabetes mellitus: Managed by primary care.  Diet emphasized.  I stressed the importance of lifestyle modification and he promises to do better. Patient will be seen in follow-up appointment in 6 months or earlier if the patient has any concerns    Medication Adjustments/Labs and Tests Ordered: Current medicines are reviewed at length with the patient today.  Concerns regarding medicines are outlined above.  No orders of the defined types were placed in this encounter.  No orders of the defined types were placed in this encounter.    No chief complaint on file.    History of Present Illness:    Kerry Matthews is a 85 y.o. male.  Patient has past medical history of coronary artery disease, essential hypertension and dyslipidemia.  He denies any problems at this time and takes care of activities of daily living.  No chest pain orthopnea or PND.  At the time of my evaluation, the patient is alert awake oriented and in no distress.  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  ANCA antibodies- myeloperoxidase ABS-greater than  100, ANCA proteinase 3-less than 3.5 04/02/2019- ANCA titers- p-ANCA +1: 160, C ANCA-less than 1: 20, atypical p-ANCA titer-less than 1: 20   Abnormal CT of the chest    Abnormal findings on diagnostic imaging of lung 04/12/2019   04/01/2019-CT chest with contrast- no acute process in chest abdomen or pelvis, interstitial lung disease suspicious for early or mild UIP, pulmonary artery enlargement suggest PAH    Acute low back pain without sciatica 03/08/2020   Acute lower UTI 07/07/2020   Acute pain of left knee 11/30/2019   AKI (acute kidney injury) ([place])    Alpha-1-antitrypsin deficiency carrier 05/04/2019   Anemia    Atherosclerotic heart disease of native coronary artery without angina pectoris 05/03/2018   Body mass index (BMI) 29.0-29.9, adult 01/05/2020   Bradycardia    Burning sensation of feet 01/05/2020   CKD (chronic kidney disease) 05/03/2018   Community acquired pneumonia    Coronary artery disease    Cough    Diabetes ([name])    Diabetes mellitus type 2 in obese ([name]) 04/01/2019   Dyslipidemia associated with type 2 diabetes mellitus ([name]) 05/03/2018   Dysuria 09/07/2020   Elevated LFTs 04/01/2019   Elevated rheumatoid factor 04/12/2019   04/03/2019-rheumatoid factor-95.4   Emphysema (subcutaneous) (surgical) resulting from a procedure 05/03/2018   Essential hypertension 05/03/2018   Fatigue 03/29/2021   Fever    GAD (generalized anxiety disorder)    GERD (gastroesophageal reflux disease)    Hematuria 09/07/2020   History of alpha-1-antitrypsin deficiency 04/12/2019   History  of kidney stones    Hyperlipidemia    Hypoxemia    Idiopathic pulmonary fibrosis ([name]) 03/29/2021   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   Insomnia 05/03/2018   Interstitial pulmonary disease ([place]) 04/12/2019   04/01/2019-CT chest with contrast- no acute process in chest abdomen or pelvis, interstitial lung disease suspicious for early or mild UIP, pulmonary artery enlargement suggest PAH   04/02/2019-connective tissue work-up: Anti-Jo 1-negative Anti-DNA antibody double-stranded-negative Anti-scleroderma antibody-negative Sjogren's syndrome antibody-negative Sjogren's syndrome antibody-negative CK-31 CCP-6 E   Leukocytosis 03/08/2020   Low vitamin B12 level 04/03/2019   Lower extremity edema    Medicare annual wellness visit, subsequent 08/09/2020   Microscopic polyangiitis ([place]) 03/29/2021   Mixed hyperlipidemia 05/03/2018   Myalgia 04/19/2019   Osteoarthritis    Other emphysema ([name])    Other long term (current) drug therapy 03/29/2021   Paresthesias in left hand 11/30/2019   Peripheral polyneuropathy 01/30/2020   Pneumonia 04/01/2019   Pneumonitis    Polymyositis ([place])    Primary insomnia    Proteinuria 09/07/2020   Renal insufficiency 09/06/2020   Renal stones    Rheumatoid factor positive 03/29/2021   Sepsis ([name]) 04/19/2019   Severe sepsis ([name]) 07/07/2020   Skin cancer    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/17/2020   Transaminitis    Trigger finger 05/03/2018   UC (ulcerative colitis) ([name])    Urinary urgency 09/07/2020   UTI (urinary tract infection)    Vasculitis ([place]) 03/29/2021    Past Surgical History:  Procedure Laterality Date   ANGIOPLASTY     BACK SURGERY     CATARACT EXTRACTION     COLONOSCOPY  06/16/2005   Mild colitis involving splenic flexure. Colonic polyps, status post polypectomy. Mild pancolonic diverticulitits. Internal hemorrhoids.    ESOPHAGOGASTRODUODENOSCOPY  04/26/2003   Irregular Z line suggestive of GERD. Mild gastritis status post CLO testing.    TOTAL KNEE ARTHROPLASTY Left 12/17/2020   Procedure: LEFT TOTAL KNEE ARTHROPLASTY;  Surgeon: Leandrew Koyanagi, MD;  Location: [facility];  Service: Orthopedics;  Laterality: Left;   TRIGGER FINGER RELEASE      Current Medications: Current Meds  Medication Sig   aspirin EC 81 MG tablet Take 1 tablet (81 mg total) by mouth 2 (two) times daily. To be taken after surgery   furosemide (LASIX) 20 MG  tablet Take 20 mg by mouth daily as needed for fluid.   gabapentin (NEURONTIN) 400 MG capsule Take 400 mg by mouth 2 (two) times daily.   glucose blood (TRUE METRIX BLOOD GLUCOSE TEST) test strip E11.69 TEST BLOOD SUGAR THREE TIMES DAILY BEFORE MEALS   Multiple Vitamins-Minerals (PRESERVISION AREDS 2) CAPS Take 1 capsule by mouth 2 (two) times daily after a meal.   nitroGLYCERIN (NITROSTAT) 0.4 MG SL tablet Place 1 tablet (0.4 mg total) under the tongue every 5 (five) minutes as needed for chest pain.   Omega-3 Fatty Acids (FISH OIL) 1000 MG CAPS Take 2,000 mg by mouth daily.   omeprazole (PRILOSEC) 20 MG capsule Take 20 mg by mouth daily.   riTUXimab (RITUXAN) 100 MG/10ML injection Inject 100 mg into the vein every 6 (six) months.   vitamin B-12 (CYANOCOBALAMIN) 1000 MCG tablet Take 1 tablet (1,000 mcg total) by mouth daily. (Patient taking differently: Take 1,000 mcg by mouth daily before breakfast.)     Allergies:   Ace inhibitors, Hydrocodone, Hydrocodone-acetaminophen, Nsaids, and Sulfamethoxazole-trimethoprim   Social History   Socioeconomic History   Marital status: Married    Spouse name:  Not on file   Number of children: 2   Years of education: Not on file   Highest education level: Not on file  Occupational History   Occupation: retired  Tobacco Use   Smoking status: Never   Smokeless tobacco: Never  Vaping Use   Vaping Use: Never used  Substance and Sexual Activity   Alcohol use: Never   Drug use: Never   Sexual activity: Not on file  Other Topics Concern   Not on file  Social History Narrative   Not on file   Social Determinants of Health   Financial Resource Strain: Not on file  Food Insecurity: No Food Insecurity   Worried About Running Out of Food in the Last Year: Never true   [place] in the Last Year: Never true  Transportation Needs: No Transportation Needs   Lack of Transportation (Medical): No   Lack of Transportation (Non-Medical): No   Physical Activity: Sufficiently Active   Days of Exercise per Week: 5 days   Minutes of Exercise per Session: 30 min  Stress: Not on file  Social Connections: Not on file     Family History: The patient's family history includes Clotting disorder in his brother; Heart attack in his sister; Lung disease in his sister; Pancreatic cancer in his sister; Stroke in his father; Tuberculosis in his mother. There is no history of Colon cancer or Esophageal cancer.  ROS:   Please see the history of present illness.    All other systems reviewed and are negative.  EKGs/Labs/Other Studies Reviewed:    The following studies were reviewed today: I discussed my findings with the patient at length   Recent Labs: 07/09/2020: Magnesium 1.8 12/13/2020: ALT 26 12/18/2020: BUN 13; Creatinine, Ser 1.35; Hemoglobin 11.5; Platelets 270; Potassium 4.2; Sodium 133  Recent Lipid Panel    Component Value Date/Time   CHOL 168 04/04/2020 0743   TRIG 76 04/04/2020 0743   HDL 43 04/04/2020 0743   CHOLHDL 3.9 04/04/2020 0743   LDLCALC 111 (H) 04/04/2020 0743    Physical Exam:    VS:  BP 134/62   Pulse 60   Ht '5\' 8"'$  (1.727 m)   Wt 189 lb 9.6 oz (86 kg)   SpO2 95%   BMI 28.83 kg/m     Wt Readings from Last 3 Encounters:  04/02/21 189 lb 9.6 oz (86 kg)  01/14/21 187 lb (84.8 kg)  12/17/20 189 lb (85.7 kg)     GEN: Patient is in no acute distress HEENT: Normal NECK: No JVD; No carotid bruits LYMPHATICS: No lymphadenopathy CARDIAC: Hear sounds regular, 2/6 systolic murmur at the apex. RESPIRATORY:  Clear to auscultation without rales, wheezing or rhonchi  ABDOMEN: Soft, non-tender, non-distended MUSCULOSKELETAL:  No edema; No deformity  SKIN: Warm and dry NEUROLOGIC:  Alert and oriented x 3 PSYCHIATRIC:  Normal affect   Signed, Jenean Lindau, MD  04/02/2021 11:33 AM    [place]

## 2021-04-02 NOTE — Patient Instructions (Signed)
Medication Instructions:  No medication changes. *If you need a refill on your cardiac medications before your next appointment, please call your pharmacy*   Lab Work: Your physician recommends that you return for lab work in: the next few days. You need to have labs done when you are fasting.  You can come Monday through Friday 8:30 am to 12:00 pm and 1:15 to 4:30. You do not need to make an appointment as the order has already been placed. The labs you are going to have done are BMET, CBC, TSH, vitamin D, hgb A1C, LFT and Lipids.  If you have labs (blood work) drawn today and your tests are completely normal, you will receive your results only by: MyChart Message (if you have MyChart) OR A paper copy in the mail If you have any lab test that is abnormal or we need to change your treatment, we will call you to review the results.   Testing/Procedures: None ordered   Follow-Up: At [facility], you and your health needs are our priority.  As part of our continuing mission to provide you with exceptional heart care, we have created designated Provider Care Teams.  These Care Teams include your primary Cardiologist (physician) and Advanced Practice Providers (APPs -  Physician Assistants and Nurse Practitioners) who all work together to provide you with the care you need, when you need it.  We recommend signing up for the patient portal called "MyChart".  Sign up information is provided on this After Visit Summary.  MyChart is used to connect with patients for Virtual Visits (Telemedicine).  Patients are able to view lab/test results, encounter notes, upcoming appointments, etc.  Non-urgent messages can be sent to your provider as well.   To learn more about what you can do with MyChart, go to [email or web address].    Your next appointment:   6 month(s)  The format for your next appointment:   In Person  Provider:   Rajan Revankar, MD   Other Instructions NA  

## 2021-04-04 DIAGNOSIS — E669 Obesity, unspecified: Secondary | ICD-10-CM | POA: Diagnosis not present

## 2021-04-04 DIAGNOSIS — I2510 Atherosclerotic heart disease of native coronary artery without angina pectoris: Secondary | ICD-10-CM | POA: Diagnosis not present

## 2021-04-04 DIAGNOSIS — E1169 Type 2 diabetes mellitus with other specified complication: Secondary | ICD-10-CM | POA: Diagnosis not present

## 2021-04-04 DIAGNOSIS — Z139 Encounter for screening, unspecified: Secondary | ICD-10-CM | POA: Diagnosis not present

## 2021-04-04 DIAGNOSIS — I10 Essential (primary) hypertension: Secondary | ICD-10-CM | POA: Diagnosis not present

## 2021-04-04 DIAGNOSIS — E782 Mixed hyperlipidemia: Secondary | ICD-10-CM | POA: Diagnosis not present

## 2021-04-04 DIAGNOSIS — R5383 Other fatigue: Secondary | ICD-10-CM | POA: Diagnosis not present

## 2021-04-05 LAB — CBC WITH DIFFERENTIAL/PLATELET
Basophils Absolute: 0.1 10*3/uL (ref 0.0–0.2)
Basos: 1 %
EOS (ABSOLUTE): 0.3 10*3/uL (ref 0.0–0.4)
Eos: 4 %
Hematocrit: 38.8 % (ref 37.5–51.0)
Hemoglobin: 13.1 g/dL (ref 13.0–17.7)
Immature Grans (Abs): 0 10*3/uL (ref 0.0–0.1)
Immature Granulocytes: 0 %
Lymphocytes Absolute: 1.8 10*3/uL (ref 0.7–3.1)
Lymphs: 26 %
MCH: 27.7 pg (ref 26.6–33.0)
MCHC: 33.8 g/dL (ref 31.5–35.7)
MCV: 82 fL (ref 79–97)
Monocytes Absolute: 0.8 10*3/uL (ref 0.1–0.9)
Monocytes: 11 %
Neutrophils Absolute: 4.1 10*3/uL (ref 1.4–7.0)
Neutrophils: 58 %
Platelets: 218 10*3/uL (ref 150–450)
RBC: 4.73 x10E6/uL (ref 4.14–5.80)
RDW: 13.6 % (ref 11.6–15.4)
WBC: 7.1 10*3/uL (ref 3.4–10.8)

## 2021-04-05 LAB — HEPATIC FUNCTION PANEL
ALT: 18 IU/L (ref 0–44)
AST: 20 IU/L (ref 0–40)
Albumin: 4.2 g/dL (ref 3.6–4.6)
Alkaline Phosphatase: 129 IU/L — ABNORMAL HIGH (ref 44–121)
Bilirubin Total: 0.6 mg/dL (ref 0.0–1.2)
Bilirubin, Direct: 0.14 mg/dL (ref 0.00–0.40)
Total Protein: 6 g/dL (ref 6.0–8.5)

## 2021-04-05 LAB — BASIC METABOLIC PANEL
BUN/Creatinine Ratio: 16 (ref 10–24)
BUN: 20 mg/dL (ref 8–27)
CO2: 19 mmol/L — ABNORMAL LOW (ref 20–29)
Calcium: 8.7 mg/dL (ref 8.6–10.2)
Chloride: 104 mmol/L (ref 96–106)
Creatinine, Ser: 1.29 mg/dL — ABNORMAL HIGH (ref 0.76–1.27)
Glucose: 118 mg/dL — ABNORMAL HIGH (ref 65–99)
Potassium: 5 mmol/L (ref 3.5–5.2)
Sodium: 138 mmol/L (ref 134–144)
eGFR: 55 mL/min/{1.73_m2} — ABNORMAL LOW (ref >59)

## 2021-04-05 LAB — HEMOGLOBIN A1C
Est. average glucose Bld gHb Est-mCnc: 146 mg/dL
Hgb A1c MFr Bld: 6.7 % — ABNORMAL HIGH (ref 4.8–5.6)

## 2021-04-05 LAB — TSH: TSH: 2.83 u[IU]/mL (ref 0.450–4.500)

## 2021-04-05 LAB — VITAMIN D 25 HYDROXY (VIT D DEFICIENCY, FRACTURES): Vit D, 25-Hydroxy: 24.1 ng/mL — ABNORMAL LOW (ref 30.0–100.0)

## 2021-04-05 LAB — LIPID PANEL
Chol/HDL Ratio: 7.1 ratio — ABNORMAL HIGH (ref 0.0–5.0)
Cholesterol, Total: 254 mg/dL — ABNORMAL HIGH (ref 100–199)
HDL: 36 mg/dL — ABNORMAL LOW (ref >39)
LDL Chol Calc (NIH): 194 mg/dL — ABNORMAL HIGH (ref 0–99)
Triglycerides: 129 mg/dL (ref 0–149)
VLDL Cholesterol Cal: 24 mg/dL (ref 5–40)

## 2021-04-05 NOTE — Addendum Note (Signed)
Addended by: Truddie Hidden on: 04/05/2021 11:32 AM   Modules accepted: Orders

## 2021-04-08 ENCOUNTER — Other Ambulatory Visit: Payer: Self-pay

## 2021-04-08 MED ORDER — ERGOCALCIFEROL 1.25 MG (50000 UT) PO CAPS: 50000.0000 [IU] | ORAL_CAPSULE | ORAL | 0 refills | Status: DC

## 2021-04-10 ENCOUNTER — Other Ambulatory Visit: Payer: Self-pay | Admitting: Family Medicine

## 2021-04-11 ENCOUNTER — Ambulatory Visit (INDEPENDENT_AMBULATORY_CARE_PROVIDER_SITE_OTHER): Payer: Medicare HMO | Admitting: Family Medicine

## 2021-04-11 ENCOUNTER — Other Ambulatory Visit: Payer: Self-pay

## 2021-04-11 ENCOUNTER — Encounter: Payer: Self-pay | Admitting: Family Medicine

## 2021-04-11 ENCOUNTER — Other Ambulatory Visit: Payer: Self-pay | Admitting: Family Medicine

## 2021-04-11 VITALS — BP 122/82 | HR 62 | Temp 96.2°F | Ht 68.0 in | Wt 186.0 lb

## 2021-04-11 DIAGNOSIS — I10 Essential (primary) hypertension: Secondary | ICD-10-CM | POA: Diagnosis not present

## 2021-04-11 DIAGNOSIS — I2510 Atherosclerotic heart disease of native coronary artery without angina pectoris: Secondary | ICD-10-CM | POA: Diagnosis not present

## 2021-04-11 DIAGNOSIS — E1142 Type 2 diabetes mellitus with diabetic polyneuropathy: Secondary | ICD-10-CM | POA: Diagnosis not present

## 2021-04-11 DIAGNOSIS — E782 Mixed hyperlipidemia: Secondary | ICD-10-CM

## 2021-04-11 DIAGNOSIS — I700 Atherosclerosis of aorta: Secondary | ICD-10-CM

## 2021-04-11 LAB — POCT UA - MICROALBUMIN: Microalbumin Ur, POC: 10 mg/L

## 2021-04-11 MED ORDER — FAMOTIDINE 40 MG PO TABS: 40.0000 mg | ORAL_TABLET | Freq: Every day | ORAL | 0 refills | Status: DC

## 2021-04-11 NOTE — Progress Notes (Signed)
Subjective:  Patient ID: Kerry Matthews, male    DOB: [birth year]  Age: 85 y.o. MRN: [id number]  Chief Complaint  Patient presents with   Diabetes   Hyperlipidemia   Essential hypertension -He is attempting to eat healthy. Does not currently take blood pressure medication.  Diabetic polyneuropathy associated with type 2 diabetes mellitus (HCC)  DM-checks 2-3 times a day, ranges from 90-137. He is not taking any medications for diabetes. He does check his feet daily and keeps yearly diabetic eye exams.  Mixed hyperlipidemia  - He is attempting to eat healthy. Not currently on medication for cholesterol because he was not able to tolerate them.  BMI 28.28 Constipation - Taking a stool softener twice a day helps some. He has also used Miralax in the past which has helped.  Current Outpatient Medications on File Prior to Visit  Medication Sig Dispense Refill   OVER THE COUNTER MEDICATION Stool softener twice a day     aspirin EC 81 MG tablet Take 1 tablet (81 mg total) by mouth 2 (two) times daily. To be taken after surgery 84 tablet 0   ergocalciferol (VITAMIN D2) 1.25 MG (50000 UT) capsule Take 1 capsule (50,000 Units total) by mouth once a week. 12 capsule 0   furosemide (LASIX) 20 MG tablet Take 20 mg by mouth daily as needed for fluid.     gabapentin (NEURONTIN) 400 MG capsule Take 400 mg by mouth 2 (two) times daily.     glucose blood (TRUE METRIX BLOOD GLUCOSE TEST) test strip E11.69 TEST BLOOD SUGAR THREE TIMES DAILY BEFORE MEALS 300 strip 3   Multiple Vitamins-Minerals (PRESERVISION AREDS 2) CAPS Take 1 capsule by mouth 2 (two) times daily after a meal.     nitroGLYCERIN (NITROSTAT) 0.4 MG SL tablet Place 1 tablet (0.4 mg total) under the tongue every 5 (five) minutes as needed for chest pain. 25 tablet 6   Omega-3 Fatty Acids (FISH OIL) 1000 MG CAPS Take 2,000 mg by mouth daily.     riTUXimab (RITUXAN) 100 MG/10ML injection Inject 100 mg into the vein every 6 (six) months.      vitamin B-12 (CYANOCOBALAMIN) 1000 MCG tablet Take 1 tablet (1,000 mcg total) by mouth daily. (Patient taking differently: Take 1,000 mcg by mouth daily before breakfast.) 30 tablet 0   No current facility-administered medications on file prior to visit.   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  ANCA antibodies- myeloperoxidase ABS-greater than 100, ANCA proteinase 3-less than 3.5 04/02/2019- ANCA titers- p-ANCA +1: 160, C ANCA-less than 1: 20, atypical p-ANCA titer-less than 1: 20   Abnormal CT of the chest    Abnormal findings on diagnostic imaging of lung 04/12/2019   04/01/2019-CT chest with contrast- no acute process in chest abdomen or pelvis, interstitial lung disease suspicious for early or mild UIP, pulmonary artery enlargement suggest PAH    Acute low back pain without sciatica 03/08/2020   Acute low back pain without sciatica 03/08/2020   Acute lower UTI 07/07/2020   Acute pain of left knee 11/30/2019   AKI (acute kidney injury) ([place])    Alpha-1-antitrypsin deficiency carrier 05/04/2019   Anemia    Atherosclerotic heart disease of native coronary artery without angina pectoris 05/03/2018   Body mass index (BMI) 29.0-29.9, adult 01/05/2020   Bradycardia    Burning sensation of feet 01/05/2020   CKD (chronic kidney disease) 05/03/2018   Community acquired pneumonia    Community  acquired pneumonia    Coronary artery disease    Cough    Diabetes ([place])    Diabetes mellitus type 2 in obese ([place]) 04/01/2019   Dyslipidemia associated with type 2 diabetes mellitus ([place]) 05/03/2018   Dysuria 09/07/2020   Elevated LFTs 04/01/2019   Elevated rheumatoid factor 04/12/2019   04/03/2019-rheumatoid factor-95.4   Emphysema (subcutaneous) (surgical) resulting from a procedure 05/03/2018   Essential hypertension 05/03/2018   Fatigue 03/29/2021   Fever    GAD (generalized anxiety disorder)    GERD (gastroesophageal reflux disease)    Hematuria  09/07/2020   History of alpha-1-antitrypsin deficiency 04/12/2019   History of kidney stones    Hyperlipidemia    Hypoxemia    Idiopathic pulmonary fibrosis ([place]) 03/29/2021   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   Insomnia 05/03/2018   Interstitial pulmonary disease ([place]) 04/12/2019   04/01/2019-CT chest with contrast- no acute process in chest abdomen or pelvis, interstitial lung disease suspicious for early or mild UIP, pulmonary artery enlargement suggest PAH  04/02/2019-connective tissue work-up: Anti-Jo 1-negative Anti-DNA antibody double-stranded-negative Anti-scleroderma antibody-negative Sjogren's syndrome antibody-negative Sjogren's syndrome antibody-negative CK-31 CCP-6 E   Leukocytosis 03/08/2020   Low vitamin B12 level 04/03/2019   Lower extremity edema    Medicare annual wellness visit, subsequent 08/09/2020   Microscopic polyangiitis ([place]) 03/29/2021   Mixed hyperlipidemia 05/03/2018   Myalgia 04/19/2019   Osteoarthritis    Other emphysema ([place])    Other long term (current) drug therapy 03/29/2021   Paresthesias in left hand 11/30/2019   Peripheral polyneuropathy 01/30/2020   Pneumonia 04/01/2019   Pneumonitis    Pneumonitis    Polymyositis ([place])    Primary insomnia    Proteinuria 09/07/2020   Renal insufficiency 09/06/2020   Renal stones    Rheumatoid factor positive 03/29/2021   Sepsis ([place]) 04/19/2019   Severe sepsis ([place]) 07/07/2020   Skin cancer    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/17/2020   Transaminitis    Trigger finger 05/03/2018   UC (ulcerative colitis) ([place])    Urinary urgency 09/07/2020   UTI (urinary tract infection)    Vasculitis ([place]) 03/29/2021   Past Surgical History:  Procedure Laterality Date   ANGIOPLASTY     BACK SURGERY     CATARACT EXTRACTION     COLONOSCOPY  06/16/2005   Mild colitis involving splenic flexure. Colonic polyps, status post polypectomy. Mild pancolonic diverticulitits. Internal hemorrhoids.     ESOPHAGOGASTRODUODENOSCOPY  04/26/2003   Irregular Z line suggestive of GERD. Mild gastritis status post CLO testing.    TOTAL KNEE ARTHROPLASTY Left 12/17/2020   Procedure: LEFT TOTAL KNEE ARTHROPLASTY;  Surgeon: Leandrew Koyanagi, MD;  Location: [facility];  Service: Orthopedics;  Laterality: Left;   TRIGGER FINGER RELEASE      Family History  Problem Relation Age of Onset   Tuberculosis Mother    Stroke Father    Pancreatic cancer Sister    Heart attack Sister    Lung disease Sister    Clotting disorder Brother    Colon cancer Neg Hx    Esophageal cancer Neg Hx    Social History   Socioeconomic History   Marital status: Married    Spouse name: Not on file   Number of children: 2   Years of education: Not on file   Highest education level: Not on file  Occupational History   Occupation: retired  Tobacco Use   Smoking status: Never   Smokeless tobacco: Never  Media planner  Vaping Use: Never used  Substance and Sexual Activity   Alcohol use: Never   Drug use: Never   Sexual activity: Not on file  Other Topics Concern   Not on file  Social History Narrative   Not on file   Social Determinants of Health   Financial Resource Strain: Not on file  Food Insecurity: No Food Insecurity   Worried About Running Out of Food in the Last Year: Never true   [place] in the Last Year: Never true  Transportation Needs: No Transportation Needs   Lack of Transportation (Medical): No   Lack of Transportation (Non-Medical): No  Physical Activity: Sufficiently Active   Days of Exercise per Week: 5 days   Minutes of Exercise per Session: 30 min  Stress: Not on file  Social Connections: Not on file    Review of Systems  Constitutional:  Negative for chills, diaphoresis, fatigue and fever.  HENT:  Negative for congestion, ear pain and sore throat.   Respiratory:  Negative for cough and shortness of breath.   Cardiovascular:  Negative for chest pain and leg swelling.   Gastrointestinal:  Positive for constipation. Negative for abdominal pain, diarrhea, nausea and vomiting.  Genitourinary:  Negative for dysuria and urgency.  Musculoskeletal:  Negative for arthralgias and myalgias.  Neurological:  Negative for dizziness and headaches.  Psychiatric/Behavioral:  Negative for dysphoric mood.     Objective:  BP 122/82   Pulse 62   Temp (!) 96.2 F (35.7 C)   Ht '5\' 8"'$  (1.727 m)   Wt 186 lb (84.4 kg)   SpO2 100%   BMI 28.28 kg/m   BP/Weight 04/11/2021 04/02/2021 123XX123  Systolic BP 123XX123 Q000111Q 0000000  Diastolic BP 82 62 64  Wt. (Lbs) 186 189.6 187  BMI 28.28 28.83 29.29    Physical Exam Vitals reviewed.  Constitutional:      Appearance: Normal appearance.  HENT:     Head: Normocephalic.     Right Ear: Tympanic membrane, ear canal and external ear normal.     Left Ear: Tympanic membrane, ear canal and external ear normal.     Nose: Nose normal.     Mouth/Throat:     Mouth: Mucous membranes are moist.     Pharynx: Oropharynx is clear.  Neck:     Vascular: No carotid bruit.  Cardiovascular:     Rate and Rhythm: Normal rate and regular rhythm.     Pulses: Normal pulses.     Heart sounds: Normal heart sounds.  Pulmonary:     Effort: Pulmonary effort is normal.     Breath sounds: Normal breath sounds.  Abdominal:     General: Abdomen is flat. Bowel sounds are normal.     Palpations: Abdomen is soft.  Skin:    General: Skin is warm and dry.  Neurological:     General: No focal deficit present.     Mental Status: He is alert and oriented to person, place, and time. Mental status is at baseline.  Psychiatric:        Mood and Affect: Mood normal.        Behavior: Behavior normal.   Diabetic foot exam was performed with the following findings:   No deformities, ulcerations, or other skin breakdown Normal sensation of 10g monofilament Intact posterior tibialis and dorsalis pedis pulses        Lab Results  Component Value Date   WBC 7.1  04/04/2021   HGB 13.1 04/04/2021  HCT 38.8 04/04/2021   PLT 218 04/04/2021   GLUCOSE 118 (H) 04/04/2021   CHOL 254 (H) 04/04/2021   TRIG 129 04/04/2021   HDL 36 (L) 04/04/2021   LDLCALC 194 (H) 04/04/2021   ALT 18 04/04/2021   AST 20 04/04/2021   NA 138 04/04/2021   K 5.0 04/04/2021   CL 104 04/04/2021   CREATININE 1.29 (H) 04/04/2021   BUN 20 04/04/2021   CO2 19 (L) 04/04/2021   TSH 2.830 04/04/2021   INR 1.1 12/13/2020   HGBA1C 6.7 (H) 04/04/2021   MICROALBUR 10 04/11/2021      Assessment & Plan:  1. Essential hypertension - Well controlled - Continue to eat healthy  2. Diabetic polyneuropathy associated with type 2 diabetes mellitus ([place]) - Well controlled. Pt needs to be seen every 3 months.  - POCT UA - Microalbumin - Continue to eat healthy - Start samples of Rybelsus 3 mg po daily  3. Mixed hyperlipidemia  - Very poorly controlled.  - Continue healthy diet - Start Crestor 20 mg daily  4. Atherosclerosis of aorta ([place]) Started crestor 20 mg once daily and aspirin 81 mg once daily.   5. Coronary artery disease involving native coronary artery of native heart without angina pectoris Started crestor and aspirin.   Orders Placed This Encounter  Procedures   POCT UA - Microalbumin      Follow-up: Return in about 6 weeks (around 05/23/2021).  An After Visit Summary was printed and given to the patient.  Kerry Brome, MD [facility] [phone]

## 2021-04-11 NOTE — Progress Notes (Deleted)
Subjective:  Patient ID: Kerry Matthews, male    DOB: Jul 22, 1936  Age: 85 y.o. MRN: [id number]  Chief Complaint  Patient presents with   Diabetes   Hyperlipidemia    HPI  DM-checks 2-3 times a day, ranges from 90-137  Constipation- taking a stool softener twice a day helps some. Current Outpatient Medications on File Prior to Visit  Medication Sig Dispense Refill   aspirin EC 81 MG tablet Take 1 tablet (81 mg total) by mouth 2 (two) times daily. To be taken after surgery 84 tablet 0   ergocalciferol (VITAMIN D2) 1.25 MG (50000 UT) capsule Take 1 capsule (50,000 Units total) by mouth once a week. 12 capsule 0   furosemide (LASIX) 20 MG tablet Take 20 mg by mouth daily as needed for fluid.     gabapentin (NEURONTIN) 400 MG capsule Take 400 mg by mouth 2 (two) times daily.     glucose blood (TRUE METRIX BLOOD GLUCOSE TEST) test strip E11.69 TEST BLOOD SUGAR THREE TIMES DAILY BEFORE MEALS 300 strip 3   Multiple Vitamins-Minerals (PRESERVISION AREDS 2) CAPS Take 1 capsule by mouth 2 (two) times daily after a meal.     nitroGLYCERIN (NITROSTAT) 0.4 MG SL tablet Place 1 tablet (0.4 mg total) under the tongue every 5 (five) minutes as needed for chest pain. 25 tablet 6   Omega-3 Fatty Acids (FISH OIL) 1000 MG CAPS Take 2,000 mg by mouth daily.     omeprazole (PRILOSEC) 20 MG capsule Take 20 mg by mouth daily.     riTUXimab (RITUXAN) 100 MG/10ML injection Inject 100 mg into the vein every 6 (six) months.     vitamin B-12 (CYANOCOBALAMIN) 1000 MCG tablet Take 1 tablet (1,000 mcg total) by mouth daily. (Patient taking differently: Take 1,000 mcg by mouth daily before breakfast.) 30 tablet 0   No current facility-administered medications on file prior to visit.   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  ANCA antibodies- myeloperoxidase ABS-greater than 100, ANCA proteinase 3-less than 3.5 04/02/2019- ANCA titers- p-ANCA +1: 160, C ANCA-less than  1: 20, atypical p-ANCA titer-less than 1: 20   Abnormal CT of the chest    Abnormal findings on diagnostic imaging of lung 04/12/2019   04/01/2019-CT chest with contrast- no acute process in chest abdomen or pelvis, interstitial lung disease suspicious for early or mild UIP, pulmonary artery enlargement suggest PAH    Acute low back pain without sciatica 03/08/2020   Acute lower UTI 07/07/2020   Acute pain of left knee 11/30/2019   AKI (acute kidney injury) ([place])    Alpha-1-antitrypsin deficiency carrier 05/04/2019   Anemia    Atherosclerotic heart disease of native coronary artery without angina pectoris 05/03/2018   Body mass index (BMI) 29.0-29.9, adult 01/05/2020   Bradycardia    Burning sensation of feet 01/05/2020   CKD (chronic kidney disease) 05/03/2018   Community acquired pneumonia    Coronary artery disease    Cough    Diabetes ([place])    Diabetes mellitus type 2 in obese ([place]) 04/01/2019   Dyslipidemia associated with type 2 diabetes mellitus ([place]) 05/03/2018   Dysuria 09/07/2020   Elevated LFTs 04/01/2019   Elevated rheumatoid factor 04/12/2019   04/03/2019-rheumatoid factor-95.4   Emphysema (subcutaneous) (surgical) resulting from a procedure 05/03/2018   Essential hypertension 05/03/2018   Fatigue 03/29/2021   Fever    GAD (generalized anxiety disorder)    GERD (gastroesophageal reflux disease)  Hematuria 09/07/2020   History of alpha-1-antitrypsin deficiency 04/12/2019   History of kidney stones    Hyperlipidemia    Hypoxemia    Idiopathic pulmonary fibrosis ([place]) 03/29/2021   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   Insomnia 05/03/2018   Interstitial pulmonary disease ([place]) 04/12/2019   04/01/2019-CT chest with contrast- no acute process in chest abdomen or pelvis, interstitial lung disease suspicious for early or mild UIP, pulmonary artery enlargement suggest PAH  04/02/2019-connective tissue work-up: Anti-Jo 1-negative Anti-DNA antibody double-stranded-negative  Anti-scleroderma antibody-negative Sjogren's syndrome antibody-negative Sjogren's syndrome antibody-negative CK-31 CCP-6 E   Leukocytosis 03/08/2020   Low vitamin B12 level 04/03/2019   Lower extremity edema    Medicare annual wellness visit, subsequent 08/09/2020   Microscopic polyangiitis ([place]) 03/29/2021   Mixed hyperlipidemia 05/03/2018   Myalgia 04/19/2019   Osteoarthritis    Other emphysema ([place])    Other long term (current) drug therapy 03/29/2021   Paresthesias in left hand 11/30/2019   Peripheral polyneuropathy 01/30/2020   Pneumonia 04/01/2019   Pneumonitis    Polymyositis ([place])    Primary insomnia    Proteinuria 09/07/2020   Renal insufficiency 09/06/2020   Renal stones    Rheumatoid factor positive 03/29/2021   Sepsis ([place]) 04/19/2019   Severe sepsis ([place]) 07/07/2020   Skin cancer    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/17/2020   Transaminitis    Trigger finger 05/03/2018   UC (ulcerative colitis) ([place])    Urinary urgency 09/07/2020   UTI (urinary tract infection)    Vasculitis ([place]) 03/29/2021   Past Surgical History:  Procedure Laterality Date   ANGIOPLASTY     BACK SURGERY     CATARACT EXTRACTION     COLONOSCOPY  06/16/2005   Mild colitis involving splenic flexure. Colonic polyps, status post polypectomy. Mild pancolonic diverticulitits. Internal hemorrhoids.    ESOPHAGOGASTRODUODENOSCOPY  04/26/2003   Irregular Z line suggestive of GERD. Mild gastritis status post CLO testing.    TOTAL KNEE ARTHROPLASTY Left 12/17/2020   Procedure: LEFT TOTAL KNEE ARTHROPLASTY;  Surgeon: Leandrew Koyanagi, MD;  Location: [facility];  Service: Orthopedics;  Laterality: Left;   TRIGGER FINGER RELEASE      Family History  Problem Relation Age of Onset   Tuberculosis Mother    Stroke Father    Pancreatic cancer Sister    Heart attack Sister    Lung disease Sister    Clotting disorder Brother    Colon cancer Neg Hx    Esophageal cancer Neg Hx    Social History   Socioeconomic  History   Marital status: Married    Spouse name: Not on file   Number of children: 2   Years of education: Not on file   Highest education level: Not on file  Occupational History   Occupation: retired  Tobacco Use   Smoking status: Never   Smokeless tobacco: Never  Vaping Use   Vaping Use: Never used  Substance and Sexual Activity   Alcohol use: Never   Drug use: Never   Sexual activity: Not on file  Other Topics Concern   Not on file  Social History Narrative   Not on file   Social Determinants of Health   Financial Resource Strain: Not on file  Food Insecurity: No Food Insecurity   Worried About Running Out of Food in the Last Year: Never true   Ran Out of Food in the Last Year: Never true  Transportation Needs: No Transportation Needs   Lack of Transportation (  Medical): No   Lack of Transportation (Non-Medical): No  Physical Activity: Sufficiently Active   Days of Exercise per Week: 5 days   Minutes of Exercise per Session: 30 min  Stress: Not on file  Social Connections: Not on file    Review of Systems  Constitutional:  Negative for chills, diaphoresis, fatigue and fever.  HENT:  Negative for congestion, ear pain and sore throat.   Respiratory:  Negative for cough and shortness of breath.   Cardiovascular:  Negative for chest pain and leg swelling.  Gastrointestinal:  Positive for constipation. Negative for abdominal pain, diarrhea, nausea and vomiting.  Genitourinary:  Negative for dysuria and urgency.  Musculoskeletal:  Negative for arthralgias and myalgias.  Neurological:  Negative for dizziness and headaches.  Psychiatric/Behavioral:  Negative for dysphoric mood.     Objective:  BP 122/82   Pulse 62   Temp (!) 96.2 F (35.7 C)   Ht '5\' 8"'$  (1.727 m)   Wt 186 lb (84.4 kg)   SpO2 100%   BMI 28.28 kg/m   BP/Weight 04/11/2021 04/02/2021 123XX123  Systolic BP 123XX123 Q000111Q 0000000  Diastolic BP 82 62 64  Wt. (Lbs) 186 189.6 187  BMI 28.28 28.83 29.29     Physical Exam  Diabetic Foot Exam - Simple   No data filed      Lab Results  Component Value Date   WBC 7.1 04/04/2021   HGB 13.1 04/04/2021   HCT 38.8 04/04/2021   PLT 218 04/04/2021   GLUCOSE 118 (H) 04/04/2021   CHOL 254 (H) 04/04/2021   TRIG 129 04/04/2021   HDL 36 (L) 04/04/2021   LDLCALC 194 (H) 04/04/2021   ALT 18 04/04/2021   AST 20 04/04/2021   NA 138 04/04/2021   K 5.0 04/04/2021   CL 104 04/04/2021   CREATININE 1.29 (H) 04/04/2021   BUN 20 04/04/2021   CO2 19 (L) 04/04/2021   TSH 2.830 04/04/2021   INR 1.1 12/13/2020   HGBA1C 6.7 (H) 04/04/2021   MICROALBUR 10 03/06/2020      Assessment & Plan:   1. Essential hypertension  2. Diabetic polyneuropathy associated with type 2 diabetes mellitus ([place])  3. Mixed hyperlipidemia   No orders of the defined types were placed in this encounter.   No orders of the defined types were placed in this encounter.    Follow-up: No follow-ups on file.  An After Visit Summary was printed and given to the patient.  [name], MD [facility] [phone]

## 2021-04-11 NOTE — Patient Instructions (Addendum)
High Cholesterol:  Start on rosuvastatin 20 mg once daily at night. (medicine to help with prevention of a heart attack and stroke.) Diabetes  Start on Rybelsus 3 mg once daily. (medicine to help with prevention of a heart attack and stroke.) Vitamin Deficiency: Start on Vitamin D 50000 once weekly.  High Cholesterol  High cholesterol is a condition in which the blood has high levels of a white, waxy substance similar to fat (cholesterol). The liver makes all the cholesterol that the body needs. The human body needs small amounts of cholesterol to help build cells. A person gets extra orexcess cholesterol from the food that he or she eats. The blood carries cholesterol from the liver to the rest of the body. If you have high cholesterol, deposits (plaques) may build up on the walls of your arteries. Arteries are the blood vessels that carry blood away from your heart. These plaques make the arteries narrowand stiff. Cholesterol plaques increase your risk for heart attack and stroke. Work withyour health care provider to keep your cholesterol levels in a healthy range. What increases the risk? The following factors may make you more likely to develop this condition: Eating foods that are high in animal fat (saturated fat) or cholesterol. Being overweight. Not getting enough exercise. A family history of high cholesterol (familial hypercholesterolemia). Use of tobacco products. Having diabetes. What are the signs or symptoms? There are no symptoms of this condition. How is this diagnosed? This condition may be diagnosed based on the results of a blood test. If you are older than 83 years of age, your health care provider may check your cholesterol levels every 4-6 years. You may be checked more often if you have high cholesterol or other risk factors for heart disease. The blood test for cholesterol measures: "Bad" cholesterol, or LDL cholesterol. This is the main type of cholesterol that  causes heart disease. The desired level is less than 100 mg/dL. "Good" cholesterol, or HDL cholesterol. HDL helps protect against heart disease by cleaning the arteries and carrying the LDL to the liver for processing. The desired level for HDL is 60 mg/dL or higher. Triglycerides. These are fats that your body can store or burn for energy. The desired level is less than 150 mg/dL. Total cholesterol. This measures the total amount of cholesterol in your blood and includes LDL, HDL, and triglycerides. The desired level is less than 200 mg/dL. How is this treated? This condition may be treated with: Diet changes. You may be asked to eat foods that have more fiber and less saturated fats or added sugar. Lifestyle changes. These may include regular exercise, maintaining a healthy weight, and quitting use of tobacco products. Medicines. These are given when diet and lifestyle changes have not worked. You may be prescribed a statin medicine to help lower your cholesterol levels. Follow these instructions at home: Eating and drinking  Eat a healthy, balanced diet. This diet includes: Daily servings of a variety of fresh, frozen, or canned fruits and vegetables. Daily servings of whole grain foods that are rich in fiber. Foods that are low in saturated fats and trans fats. These include poultry and fish without skin, lean cuts of meat, and low-fat dairy products. A variety of fish, especially oily fish that contain omega-3 fatty acids. Aim to eat fish at least 2 times a week. Avoid foods and drinks that have added sugar. Use healthy cooking methods, such as roasting, grilling, broiling, baking, poaching, steaming, and stir-frying. Do not fry your  food except for stir-frying.  Lifestyle  Get regular exercise. Aim to exercise for a total of 150 minutes a week. Increase your activity level by doing activities such as gardening, walking, and taking the stairs. Do not use any products that contain nicotine  or tobacco, such as cigarettes, e-cigarettes, and chewing tobacco. If you need help quitting, ask your health care provider.  General instructions Take over-the-counter and prescription medicines only as told by your health care provider. Keep all follow-up visits as told by your health care provider. This is important. Where to find more information American Heart Association: www.heart.org National Heart, Lung, and Blood Institute: https://wilson-eaton.com/ Contact a health care provider if: You have trouble achieving or maintaining a healthy diet or weight. You are starting an exercise program. You are unable to stop smoking. Get help right away if: You have chest pain. You have trouble breathing. You have any symptoms of a stroke. "BE FAST" is an easy way to remember the main warning signs of a stroke: B - Balance. Signs are dizziness, sudden trouble walking, or loss of balance. E - Eyes. Signs are trouble seeing or a sudden change in vision. F - Face. Signs are sudden weakness or numbness of the face, or the face or eyelid drooping on one side. A - Arms. Signs are weakness or numbness in an arm. This happens suddenly and usually on one side of the body. S - Speech. Signs are sudden trouble speaking, slurred speech, or trouble understanding what people say. T - Time. Time to call emergency services. Write down what time symptoms started. You have other signs of a stroke, such as: A sudden, severe headache with no known cause. Nausea or vomiting. Seizure. These symptoms may represent a serious problem that is an emergency. Do not wait to see if the symptoms will go away. Get medical help right away. Call your local emergency services (911 in the [place] Do not drive yourself to the hospital. Summary Cholesterol plaques increase your risk for heart attack and stroke. Work with your health care provider to keep your cholesterol levels in a healthy range. Eat a healthy, balanced diet, get regular  exercise, and maintain a healthy weight. Do not use any products that contain nicotine or tobacco, such as cigarettes, e-cigarettes, and chewing tobacco. Get help right away if you have any symptoms of a stroke. This information is not intended to replace advice given to you by your health care provider. Make sure you discuss any questions you have with your healthcare provider. Document Revised: 07/11/2019 Document Reviewed: 07/11/2019 Elsevier Patient Education  2022 [employer].

## 2021-04-23 ENCOUNTER — Encounter: Payer: Self-pay | Admitting: Family Medicine

## 2021-04-23 DIAGNOSIS — E1165 Type 2 diabetes mellitus with hyperglycemia: Secondary | ICD-10-CM | POA: Insufficient documentation

## 2021-04-23 DIAGNOSIS — Z794 Long term (current) use of insulin: Secondary | ICD-10-CM | POA: Insufficient documentation

## 2021-04-23 DIAGNOSIS — E1142 Type 2 diabetes mellitus with diabetic polyneuropathy: Secondary | ICD-10-CM | POA: Insufficient documentation

## 2021-04-23 HISTORY — DX: Long term (current) use of insulin: E11.65

## 2021-04-23 MED ORDER — OMEPRAZOLE 20 MG PO CPDR: 20.0000 mg | DELAYED_RELEASE_CAPSULE | Freq: Every day | ORAL | 1 refills | Status: DC

## 2021-04-23 MED ORDER — RYBELSUS 3 MG PO TABS: 3.0000 mg | ORAL_TABLET | Freq: Every day | ORAL | 0 refills | Status: AC

## 2021-04-23 MED ORDER — ROSUVASTATIN CALCIUM 20 MG PO TABS: 20.0000 mg | ORAL_TABLET | Freq: Every day | ORAL | 3 refills | Status: DC

## 2021-05-09 ENCOUNTER — Other Ambulatory Visit: Payer: Self-pay

## 2021-05-09 ENCOUNTER — Ambulatory Visit (INDEPENDENT_AMBULATORY_CARE_PROVIDER_SITE_OTHER): Payer: Medicare HMO | Admitting: Family Medicine

## 2021-05-09 VITALS — BP 106/60 | HR 56 | Temp 97.3°F | Ht 68.0 in | Wt 189.0 lb

## 2021-05-09 DIAGNOSIS — E1142 Type 2 diabetes mellitus with diabetic polyneuropathy: Secondary | ICD-10-CM

## 2021-05-09 DIAGNOSIS — E782 Mixed hyperlipidemia: Secondary | ICD-10-CM

## 2021-05-09 DIAGNOSIS — I10 Essential (primary) hypertension: Secondary | ICD-10-CM

## 2021-05-09 NOTE — Progress Notes (Signed)
Subjective:  Patient ID: Kerry Matthews, male    DOB: 12-Sep-1935  Age: 85 y.o. MRN: [id number]  Chief Complaint  Patient presents with   Hypertension    HPI Diabetes with neuropathy. On rybelsus 3 mg once daily. Sugars 82-120. Checking sugars daily.  On gabapentin for neuropathy. Hypertension: Well-controlled on minimal medicine.  On Lasix 20 mg once daily Hyperlipidemia: Tolerating Crestor 20 mg once daily.  Previously his liver functions increased significantly in early 2022 on statins.  Patient eats healthy.  He exercises.  Patient is here to recheck liver function tests.  Current Outpatient Medications on File Prior to Visit  Medication Sig Dispense Refill   Coenzyme Q10 100 MG TABS Take 100 mg by mouth.     docusate sodium (COLACE) 100 MG capsule Take 100 mg by mouth 2 (two) times daily as needed for mild constipation.     aspirin EC 81 MG tablet Take 1 tablet (81 mg total) by mouth 2 (two) times daily. To be taken after surgery 84 tablet 0   ergocalciferol (VITAMIN D2) 1.25 MG (50000 UT) capsule Take 1 capsule (50,000 Units total) by mouth once a week. 12 capsule 0   furosemide (LASIX) 20 MG tablet Take 20 mg by mouth daily as needed for fluid.     gabapentin (NEURONTIN) 400 MG capsule Take 400 mg by mouth 2 (two) times daily.     glucose blood (TRUE METRIX BLOOD GLUCOSE TEST) test strip E11.69 TEST BLOOD SUGAR THREE TIMES DAILY BEFORE MEALS 300 strip 3   Multiple Vitamins-Minerals (PRESERVISION AREDS 2) CAPS Take 1 capsule by mouth 2 (two) times daily after a meal.     nitroGLYCERIN (NITROSTAT) 0.4 MG SL tablet Place 1 tablet (0.4 mg total) under the tongue every 5 (five) minutes as needed for chest pain. 25 tablet 6   Omega-3 Fatty Acids (FISH OIL) 1000 MG CAPS Take 2,000 mg by mouth daily.     omeprazole (PRILOSEC) 20 MG capsule Take 1 capsule (20 mg total) by mouth daily. 90 capsule 1   OVER THE COUNTER MEDICATION Stool softener twice a day     riTUXimab (RITUXAN) 100 MG/10ML  injection Inject 100 mg into the vein every 6 (six) months.     rosuvastatin (CRESTOR) 20 MG tablet Take 1 tablet (20 mg total) by mouth daily. 90 tablet 3   Semaglutide (RYBELSUS) 3 MG TABS Take 3 mg by mouth daily. 30 tablet 0   vitamin B-12 (CYANOCOBALAMIN) 1000 MCG tablet Take 1 tablet (1,000 mcg total) by mouth daily. (Patient taking differently: Take 1,000 mcg by mouth daily before breakfast.) 30 tablet 0   No current facility-administered medications on file prior to visit.   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  ANCA antibodies- myeloperoxidase ABS-greater than 100, ANCA proteinase 3-less than 3.5 04/02/2019- ANCA titers- p-ANCA +1: 160, C ANCA-less than 1: 20, atypical p-ANCA titer-less than 1: 20   Abnormal CT of the chest    Abnormal findings on diagnostic imaging of lung 04/12/2019   04/01/2019-CT chest with contrast- no acute process in chest abdomen or pelvis, interstitial lung disease suspicious for early or mild UIP, pulmonary artery enlargement suggest PAH    Acute low back pain without sciatica 03/08/2020   Acute low back pain without sciatica 03/08/2020   Acute lower UTI 07/07/2020   Acute pain of left knee 11/30/2019   AKI (acute kidney injury) ([place])    Alpha-1-antitrypsin deficiency carrier 05/04/2019  Anemia    Atherosclerotic heart disease of native coronary artery without angina pectoris 05/03/2018   Body mass index (BMI) 29.0-29.9, adult 01/05/2020   Bradycardia    Burning sensation of feet 01/05/2020   CKD (chronic kidney disease) 05/03/2018   Community acquired pneumonia    Community acquired pneumonia    Coronary artery disease    Cough    Diabetes ([place])    Diabetes mellitus type 2 in obese ([place]) 04/01/2019   Dyslipidemia associated with type 2 diabetes mellitus ([place]) 05/03/2018   Dysuria 09/07/2020   Elevated LFTs 04/01/2019   Elevated rheumatoid factor 04/12/2019   04/03/2019-rheumatoid factor-95.4    Emphysema (subcutaneous) (surgical) resulting from a procedure 05/03/2018   Essential hypertension 05/03/2018   Fatigue 03/29/2021   Fever    GAD (generalized anxiety disorder)    GERD (gastroesophageal reflux disease)    Hematuria 09/07/2020   History of alpha-1-antitrypsin deficiency 04/12/2019   History of kidney stones    Hyperlipidemia    Hypoxemia    Idiopathic pulmonary fibrosis ([place]) 03/29/2021   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   Insomnia 05/03/2018   Interstitial pulmonary disease ([place]) 04/12/2019   04/01/2019-CT chest with contrast- no acute process in chest abdomen or pelvis, interstitial lung disease suspicious for early or mild UIP, pulmonary artery enlargement suggest PAH  04/02/2019-connective tissue work-up: Anti-Jo 1-negative Anti-DNA antibody double-stranded-negative Anti-scleroderma antibody-negative Sjogren's syndrome antibody-negative Sjogren's syndrome antibody-negative CK-31 CCP-6 E   Leukocytosis 03/08/2020   Low vitamin B12 level 04/03/2019   Lower extremity edema    Medicare annual wellness visit, subsequent 08/09/2020   Microscopic polyangiitis ([place]) 03/29/2021   Mixed hyperlipidemia 05/03/2018   Myalgia 04/19/2019   Osteoarthritis    Other emphysema ([place])    Other long term (current) drug therapy 03/29/2021   Paresthesias in left hand 11/30/2019   Peripheral polyneuropathy 01/30/2020   Pneumonia 04/01/2019   Pneumonitis    Pneumonitis    Polymyositis ([place])    Primary insomnia    Proteinuria 09/07/2020   Renal insufficiency 09/06/2020   Renal stones    Rheumatoid factor positive 03/29/2021   Sepsis ([place]) 04/19/2019   Severe sepsis ([place]) 07/07/2020   Skin cancer    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/17/2020   Transaminitis    Trigger finger 05/03/2018   UC (ulcerative colitis) ([place])    Urinary urgency 09/07/2020   UTI (urinary tract infection)    Vasculitis ([place]) 03/29/2021   Past Surgical History:  Procedure Laterality Date   ANGIOPLASTY      BACK SURGERY     CATARACT EXTRACTION     COLONOSCOPY  06/16/2005   Mild colitis involving splenic flexure. Colonic polyps, status post polypectomy. Mild pancolonic diverticulitits. Internal hemorrhoids.    ESOPHAGOGASTRODUODENOSCOPY  04/26/2003   Irregular Z line suggestive of GERD. Mild gastritis status post CLO testing.    TOTAL KNEE ARTHROPLASTY Left 12/17/2020   Procedure: LEFT TOTAL KNEE ARTHROPLASTY;  Surgeon: Leandrew Koyanagi, MD;  Location: [facility];  Service: Orthopedics;  Laterality: Left;   TRIGGER FINGER RELEASE      Family History  Problem Relation Age of Onset   Tuberculosis Mother    Stroke Father    Pancreatic cancer Sister    Heart attack Sister    Lung disease Sister    Clotting disorder Brother    Colon cancer Neg Hx    Esophageal cancer Neg Hx    Social History   Socioeconomic History   Marital status: Married    Spouse  name: Not on file   Number of children: 2   Years of education: Not on file   Highest education level: Not on file  Occupational History   Occupation: retired  Tobacco Use   Smoking status: Never   Smokeless tobacco: Never  Vaping Use   Vaping Use: Never used  Substance and Sexual Activity   Alcohol use: Never   Drug use: Never   Sexual activity: Not on file  Other Topics Concern   Not on file  Social History Narrative   Not on file   Social Determinants of Health   Financial Resource Strain: Not on file  Food Insecurity: No Food Insecurity   Worried About Running Out of Food in the Last Year: Never true   [place] in the Last Year: Never true  Transportation Needs: No Transportation Needs   Lack of Transportation (Medical): No   Lack of Transportation (Non-Medical): No  Physical Activity: Sufficiently Active   Days of Exercise per Week: 5 days   Minutes of Exercise per Session: 30 min  Stress: Not on file  Social Connections: Not on file    Review of Systems  Constitutional:  Negative for chills and fever.  HENT:   Negative for congestion, rhinorrhea and sore throat.   Respiratory:  Negative for cough and shortness of breath.   Cardiovascular:  Negative for chest pain and palpitations.  Gastrointestinal:  Positive for constipation (on stool softeners x 2 daily.). Negative for abdominal pain, diarrhea, nausea and vomiting.  Genitourinary:  Negative for dysuria and urgency.  Musculoskeletal:  Negative for arthralgias, back pain and myalgias.  Neurological:  Negative for dizziness and headaches.  Psychiatric/Behavioral:  Negative for dysphoric mood. The patient is not nervous/anxious.     Objective:  BP 106/60   Pulse (!) 56   Temp (!) 97.3 F (36.3 C)   Ht '5\' 8"'$  (1.727 m)   Wt 189 lb (85.7 kg)   BMI 28.74 kg/m   BP/Weight 05/09/2021 [id number]  Systolic BP [id number]  Diastolic BP 60 82 62  Wt. (Lbs) 189 186 189.6  BMI 28.74 28.28 28.83    Physical Exam Vitals reviewed.  Constitutional:      Appearance: Normal appearance.  Neck:     Vascular: No carotid bruit.  Cardiovascular:     Rate and Rhythm: Normal rate and regular rhythm.     Heart sounds: Normal heart sounds.  Pulmonary:     Effort: Pulmonary effort is normal.     Breath sounds: Normal breath sounds.  Neurological:     Mental Status: He is alert and oriented to person, place, and time.  Psychiatric:        Mood and Affect: Mood normal.        Behavior: Behavior normal.    Diabetic Foot Exam - Simple   No data filed      Lab Results  Component Value Date   WBC 7.1 04/04/2021   HGB 13.1 04/04/2021   HCT 38.8 04/04/2021   PLT 218 04/04/2021   GLUCOSE 93 05/09/2021   CHOL 254 (H) 04/04/2021   TRIG 129 04/04/2021   HDL 36 (L) 04/04/2021   LDLCALC 194 (H) 04/04/2021   ALT 18 05/09/2021   AST 23 05/09/2021   NA 145 (H) 05/09/2021   K 5.2 05/09/2021   CL 105 05/09/2021   CREATININE 1.21 05/09/2021   BUN 17 05/09/2021   CO2 24 05/09/2021   TSH 2.830 04/04/2021  INR 1.1 12/13/2020   HGBA1C 6.7 (H)  04/04/2021   MICROALBUR 10 04/11/2021      Assessment & Plan:   Problem List Items Addressed This Visit       Cardiovascular and Mediastinum   Essential hypertension - Primary    Well-controlled without antihypertensives other than Lasix      Relevant Orders   Comprehensive metabolic panel (Completed)     Endocrine   Diabetic polyneuropathy associated with type 2 diabetes mellitus (HCC)    Continue Rybelsus 3 mg once daily.  Return in 8 weeks for fasting labs.        Other   Mixed hyperlipidemia    Continue Crestor 20 mg once daily. Check liver enzymes. Continue low-fat diet and exercise.     .  No orders of the defined types were placed in this encounter.   Orders Placed This Encounter  Procedures   Comprehensive metabolic panel     Follow-up: Return in about 2 months (around 07/08/2021) for chronic fasting.  An After Visit Summary was printed and given to the patient.  [name], MD [facility] [phone]

## 2021-05-10 LAB — COMPREHENSIVE METABOLIC PANEL
ALT: 18 IU/L (ref 0–44)
AST: 23 IU/L (ref 0–40)
Albumin/Globulin Ratio: 1.9 (ref 1.2–2.2)
Albumin: 4.1 g/dL (ref 3.6–4.6)
Alkaline Phosphatase: 142 IU/L — ABNORMAL HIGH (ref 44–121)
BUN/Creatinine Ratio: 14 (ref 10–24)
BUN: 17 mg/dL (ref 8–27)
Bilirubin Total: 0.5 mg/dL (ref 0.0–1.2)
CO2: 24 mmol/L (ref 20–29)
Calcium: 8.9 mg/dL (ref 8.6–10.2)
Chloride: 105 mmol/L (ref 96–106)
Creatinine, Ser: 1.21 mg/dL (ref 0.76–1.27)
Globulin, Total: 2.2 g/dL (ref 1.5–4.5)
Glucose: 93 mg/dL (ref 65–99)
Potassium: 5.2 mmol/L (ref 3.5–5.2)
Sodium: 145 mmol/L — ABNORMAL HIGH (ref 134–144)
Total Protein: 6.3 g/dL (ref 6.0–8.5)
eGFR: 59 mL/min/{1.73_m2} — ABNORMAL LOW (ref >59)

## 2021-05-20 ENCOUNTER — Encounter: Payer: Self-pay | Admitting: Family Medicine

## 2021-05-20 NOTE — Assessment & Plan Note (Signed)
Continue Crestor 20 mg once daily. Check liver enzymes. Continue low-fat diet and exercise.

## 2021-05-20 NOTE — Assessment & Plan Note (Signed)
Continue Rybelsus 3 mg once daily.  Return in 8 weeks for fasting labs.

## 2021-05-20 NOTE — Assessment & Plan Note (Signed)
Well-controlled without antihypertensives other than Lasix

## 2021-05-30 ENCOUNTER — Telehealth: Payer: Self-pay

## 2021-05-30 NOTE — Chronic Care Management (AMB) (Signed)
Chronic Care Management Pharmacy Assistant   Name: Kerry Matthews  MRN: [id number] DOB: 09/21/1935  Reason for Encounter: Disease State call for HLD   Recent office visits:  05/09/21 Kerry Brome MD. Seen for routine visit. D/C famotidine 40 mg due to completing course. Follow up in 2 months.  04/11/21 Kerry Brome MD.Seen for routine visit. Started on Omeprazole 20 mg daily. Started Rosuvastatin Calcium 20 mg daily. Started Semaglutide 3 mg daily. Follow up in 6 weeks.  Recent consult visits:  04/02/21 (Cardiology) Kerry Heinz MD. Patient reported not taking Zofran 4 mg  and Phenazopyridine 200 mg. Follow up in 6 months.  Hospital visits:  None since 04/01/21  Medications: Outpatient Encounter Medications as of 05/30/2021  Medication Sig   aspirin EC 81 MG tablet Take 1 tablet (81 mg total) by mouth 2 (two) times daily. To be taken after surgery   Coenzyme Q10 100 MG TABS Take 100 mg by mouth.   docusate sodium (COLACE) 100 MG capsule Take 100 mg by mouth 2 (two) times daily as needed for mild constipation.   ergocalciferol (VITAMIN D2) 1.25 MG (50000 UT) capsule Take 1 capsule (50,000 Units total) by mouth once a week.   furosemide (LASIX) 20 MG tablet Take 20 mg by mouth daily as needed for fluid.   gabapentin (NEURONTIN) 400 MG capsule Take 400 mg by mouth 2 (two) times daily.   glucose blood (TRUE METRIX BLOOD GLUCOSE TEST) test strip E11.69 TEST BLOOD SUGAR THREE TIMES DAILY BEFORE MEALS   Multiple Vitamins-Minerals (PRESERVISION AREDS 2) CAPS Take 1 capsule by mouth 2 (two) times daily after a meal.   nitroGLYCERIN (NITROSTAT) 0.4 MG SL tablet Place 1 tablet (0.4 mg total) under the tongue every 5 (five) minutes as needed for chest pain.   Omega-3 Fatty Acids (FISH OIL) 1000 MG CAPS Take 2,000 mg by mouth daily.   omeprazole (PRILOSEC) 20 MG capsule Take 1 capsule (20 mg total) by mouth daily.   OVER THE COUNTER MEDICATION Stool softener twice a day   riTUXimab (RITUXAN) 100  MG/10ML injection Inject 100 mg into the vein every 6 (six) months.   rosuvastatin (CRESTOR) 20 MG tablet Take 1 tablet (20 mg total) by mouth daily.   vitamin B-12 (CYANOCOBALAMIN) 1000 MCG tablet Take 1 tablet (1,000 mcg total) by mouth daily. (Patient taking differently: Take 1,000 mcg by mouth daily before breakfast.)   No facility-administered encounter medications on file as of 05/30/2021.   Lipid Panel    Component Value Date/Time   CHOL 254 (H) 04/04/2021 0833   TRIG 129 04/04/2021 0833   HDL 36 (L) 04/04/2021 0833   LDLCALC 194 (H) 04/04/2021 6761    10-year ASCVD risk score: The ASCVD Risk score (Arnett DK, et al., 2019) failed to calculate for the following reasons:   The 2019 ASCVD risk score is only valid for ages 57 to 45  Current antihyperlipidemic regimen:  Rosuvastatin 20 mg daily Omega 3 1000 mg Take 2000 mg daily  Previous antihyperlipidemic medications tried: Atorvastatin 20 mg  ASCVD risk enhancing conditions: age >73, DM, HTN, and CKD  What recent interventions/DTPs have been made by any provider to improve Cholesterol control since last CPP Visit: Pt's wife stated no changes other than putting pt back on the Rosuvastatin.  Any recent hospitalizations or ED visits since last visit with CPP? No ER visits recently  What diet changes have been made to improve Cholesterol?  Stated he is not on a real strict diet  but does try and watch what he eats  What exercise is being done to improve Cholesterol?  Pt does a lot of yard work and gardening. Pt wife stays active all the time.  Adherence Review: Does the patient have >5 day gap between last estimated fill dates? Pt wife states rosuvastatin they got a 90 day supply 3 weeks ago   Star Rating Drugs Medication Name Last Fill Days supply Rosuvastatin No fill date. Orders through mail order    Care Gaps: Last annual wellness visit? None noted   Kerry Matthews, [place] Pharmacist Assistant   [phone]

## 2021-06-03 ENCOUNTER — Telehealth: Payer: Medicare HMO

## 2021-06-10 DIAGNOSIS — Z6828 Body mass index (BMI) 28.0-28.9, adult: Secondary | ICD-10-CM | POA: Diagnosis not present

## 2021-06-10 DIAGNOSIS — Z79899 Other long term (current) drug therapy: Secondary | ICD-10-CM | POA: Diagnosis not present

## 2021-06-10 DIAGNOSIS — J84112 Idiopathic pulmonary fibrosis: Secondary | ICD-10-CM | POA: Diagnosis not present

## 2021-06-10 DIAGNOSIS — M317 Microscopic polyangiitis: Secondary | ICD-10-CM | POA: Diagnosis not present

## 2021-06-10 DIAGNOSIS — E663 Overweight: Secondary | ICD-10-CM | POA: Diagnosis not present

## 2021-06-10 DIAGNOSIS — R768 Other specified abnormal immunological findings in serum: Secondary | ICD-10-CM | POA: Diagnosis not present

## 2021-06-19 ENCOUNTER — Ambulatory Visit: Payer: Self-pay

## 2021-06-19 ENCOUNTER — Other Ambulatory Visit: Payer: Self-pay

## 2021-06-19 ENCOUNTER — Ambulatory Visit: Payer: Medicare HMO | Admitting: Orthopaedic Surgery

## 2021-06-19 ENCOUNTER — Encounter: Payer: Self-pay | Admitting: Orthopaedic Surgery

## 2021-06-19 DIAGNOSIS — Z96652 Presence of left artificial knee joint: Secondary | ICD-10-CM | POA: Diagnosis not present

## 2021-06-19 NOTE — Progress Notes (Signed)
Post-Op Visit Note   Patient: Kerry Matthews           Date of Birth: 06/06/1936           MRN: [id number] Visit Date: 06/19/2021 PCP: [name], MD   Assessment & Plan:  Chief Complaint:  Chief Complaint  Patient presents with   Left Knee - Pain   Visit Diagnoses:  1. Hx of total knee replacement, left     Plan:   Mayo is now 6 months status post left total knee arthroplasty.  He is no swelling.  Surgical scar is fully healed with no signs of complications or infection.  The patient does not complain of pain but only tightness and was hoping to regain more range of motion.   However, he is able to do all of his daily activities without any difficulty or pain.  It was reinforced that prophylactic antibiotics should be taken with any procedure including but not limited to dental work or colonoscopies.  We will plan on following up at the 12 month postop visit with 2 view xrays of the operative knee at that time. As always, instructions were given to call with any questions or concerns in the interim.  His xrays today reveal a stable knee replacement without any complications.   Follow-Up Instructions: Return in about 6 months (around 12/18/2021).   Orders:  Orders Placed This Encounter  Procedures   XR Knee 1-2 Views Left   No orders of the defined types were placed in this encounter.   Imaging: XR Knee 1-2 Views Left  Result Date: 06/19/2021 Stable total knee replacement in good alignment.    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[place]) 03/29/2021   Microscopic polyangiitis ([place]) 03/29/2021   Rheumatoid factor positive 03/29/2021   Vasculitis ([place]) 03/29/2021   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/17/2020   Hematuria 09/07/2020   Proteinuria 09/07/2020   Renal  insufficiency 09/06/2020   Coronary artery disease    GAD (generalized anxiety disorder)    Osteoarthritis    Other emphysema ([place])    Primary insomnia    Skin cancer    UC (ulcerative colitis) ([place])    UTI (urinary tract infection)    Medicare annual wellness visit, subsequent 08/09/2020   Leukocytosis 03/08/2020   Peripheral polyneuropathy 01/30/2020   Body mass index (BMI) 29.0-29.9, adult 01/05/2020   Acute pain of left knee 11/30/2019   Alpha-1-antitrypsin deficiency carrier 05/04/2019   Sepsis ([place]) 04/19/2019   Elevated rheumatoid factor 04/12/2019   Interstitial pulmonary disease ([place]) 04/12/2019   Abnormal ANCA test 04/12/2019   Abnormal findings on diagnostic imaging of lung 04/12/2019   Polymyositis ([place])    Low vitamin B12 level 04/03/2019   Abnormal CT of the chest    Essential hypertension 05/03/2018   Mixed hyperlipidemia 05/03/2018   Atherosclerotic heart disease of native coronary artery without angina pectoris 05/03/2018   GERD (gastroesophageal reflux disease) 05/03/2018   Trigger finger 05/03/2018   Bradycardia 05/03/2018   CKD (chronic kidney disease) 05/03/2018   Insomnia 05/03/2018   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  ANCA antibodies- myeloperoxidase ABS-greater than 100, ANCA proteinase 3-less than 3.5 04/02/2019- ANCA titers- p-ANCA +1: 160, C ANCA-less than 1: 20, atypical p-ANCA titer-less than 1: 20  Abnormal CT of the chest    Abnormal findings on diagnostic imaging of lung 04/12/2019   04/01/2019-CT chest with contrast- no acute process in chest abdomen or pelvis, interstitial lung disease suspicious for early or mild UIP, pulmonary artery enlargement suggest PAH    Acute low back pain without sciatica 03/08/2020   Acute low back pain without sciatica 03/08/2020   Acute lower UTI 07/07/2020   Acute pain of left knee 11/30/2019   AKI (acute kidney injury) ([place])    Alpha-1-antitrypsin  deficiency carrier 05/04/2019   Anemia    Atherosclerotic heart disease of native coronary artery without angina pectoris 05/03/2018   Body mass index (BMI) 29.0-29.9, adult 01/05/2020   Bradycardia    Burning sensation of feet 01/05/2020   CKD (chronic kidney disease) 05/03/2018   Community acquired pneumonia    Community acquired pneumonia    Coronary artery disease    Cough    Diabetes ([place])    Diabetes mellitus type 2 in obese ([place]) 04/01/2019   Dyslipidemia associated with type 2 diabetes mellitus ([place]) 05/03/2018   Dysuria 09/07/2020   Elevated LFTs 04/01/2019   Elevated rheumatoid factor 04/12/2019   04/03/2019-rheumatoid factor-95.4   Emphysema (subcutaneous) (surgical) resulting from a procedure 05/03/2018   Essential hypertension 05/03/2018   Fatigue 03/29/2021   Fever    GAD (generalized anxiety disorder)    GERD (gastroesophageal reflux disease)    Hematuria 09/07/2020   History of alpha-1-antitrypsin deficiency 04/12/2019   History of kidney stones    Hyperlipidemia    Hypoxemia    Idiopathic pulmonary fibrosis ([place]) 03/29/2021   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[place]) 03/29/2021   Insomnia 05/03/2018   Interstitial pulmonary disease ([place]) 04/12/2019   04/01/2019-CT chest with contrast- no acute process in chest abdomen or pelvis, interstitial lung disease suspicious for early or mild UIP, pulmonary artery enlargement suggest PAH  04/02/2019-connective tissue work-up: Anti-Jo 1-negative Anti-DNA antibody double-stranded-negative Anti-scleroderma antibody-negative Sjogren's syndrome antibody-negative Sjogren's syndrome antibody-negative CK-31 CCP-6 E   Leukocytosis 03/08/2020   Low vitamin B12 level 04/03/2019   Lower extremity edema    Medicare annual wellness visit, subsequent 08/09/2020   Microscopic polyangiitis ([place]) 03/29/2021   Mixed hyperlipidemia 05/03/2018   Myalgia 04/19/2019   Osteoarthritis    Other emphysema ([place])    Other long term (current) drug therapy 03/29/2021    Paresthesias in left hand 11/30/2019   Peripheral polyneuropathy 01/30/2020   Pneumonia 04/01/2019   Pneumonitis    Pneumonitis    Polymyositis ([place])    Primary insomnia    Proteinuria 09/07/2020   Renal insufficiency 09/06/2020   Renal stones    Rheumatoid factor positive 03/29/2021   Sepsis ([place]) 04/19/2019   Severe sepsis ([place]) 07/07/2020   Skin cancer    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/17/2020   Transaminitis    Trigger finger 05/03/2018   UC (ulcerative colitis) ([place])    Urinary urgency 09/07/2020   UTI (urinary tract infection)    Vasculitis ([place]) 03/29/2021    Family History  Problem Relation Age of Onset   Tuberculosis Mother    Stroke Father    Pancreatic cancer Sister    Heart attack Sister    Lung disease Sister    Clotting disorder Brother    Colon cancer Neg Hx    Esophageal cancer Neg Hx     Past Surgical History:  Procedure Laterality Date   ANGIOPLASTY     BACK SURGERY     CATARACT EXTRACTION     COLONOSCOPY  06/16/2005   Mild colitis involving splenic flexure. Colonic polyps, status post polypectomy. Mild pancolonic diverticulitits. Internal hemorrhoids.    ESOPHAGOGASTRODUODENOSCOPY  04/26/2003   Irregular Z line suggestive of GERD. Mild gastritis status post CLO testing.    TOTAL KNEE ARTHROPLASTY Left 12/17/2020   Procedure: LEFT TOTAL KNEE ARTHROPLASTY;  Surgeon: Leandrew Koyanagi, MD;  Location: [facility];  Service: Orthopedics;  Laterality: Left;   TRIGGER FINGER RELEASE     Social History   Occupational History   Occupation: retired  Tobacco Use   Smoking status: Never   Smokeless tobacco: Never  Vaping Use   Vaping Use: Never used  Substance and Sexual Activity   Alcohol use: Never   Drug use: Never   Sexual activity: Not on file

## 2021-07-29 ENCOUNTER — Encounter: Payer: Self-pay | Admitting: Physician Assistant

## 2021-07-29 ENCOUNTER — Telehealth (INDEPENDENT_AMBULATORY_CARE_PROVIDER_SITE_OTHER): Payer: Medicare HMO | Admitting: Physician Assistant

## 2021-07-29 VITALS — BP 162/68 | HR 64 | Temp 101.4°F | Ht 68.0 in | Wt 184.0 lb

## 2021-07-29 DIAGNOSIS — U071 COVID-19: Secondary | ICD-10-CM | POA: Diagnosis not present

## 2021-07-29 MED ORDER — MOLNUPIRAVIR EUA 200MG CAPSULE: 4.0000 | ORAL_CAPSULE | Freq: Two times a day (BID) | ORAL | 0 refills | Status: AC

## 2021-07-29 MED ORDER — BENZONATATE 100 MG PO CAPS: 100.0000 mg | ORAL_CAPSULE | Freq: Two times a day (BID) | ORAL | 0 refills | Status: DC | PRN

## 2021-07-29 NOTE — Progress Notes (Signed)
Virtual Visit via Telephone Note   This visit type was conducted due to national recommendations for restrictions regarding the COVID-19 Pandemic (e.g. social distancing) in an effort to limit this patient's exposure and mitigate transmission in our community.  Due to his co-morbid illnesses, this patient is at least at moderate risk for complications without adequate follow up.  This format is felt to be most appropriate for this patient at this time.  The patient did not have access to video technology/had technical difficulties with video requiring transitioning to audio format only (telephone).  All issues noted in this document were discussed and addressed.  No physical exam could be performed with this format.  Patient verbally consented to a telehealth visit.   Date:  07/29/2021   ID:  [name], DOB 21-Sep-1935, MRN [id number]  Patient Location: Home Provider Location: Office  PCP:  [name], MD    Chief Complaint:  COVID  History of Present Illness:    Fed Ceci is a 85 y.o. male with complaints of headache, runny nose and fever up to 101.4 today - did take a home COVID test and was positive - exposed to granddaughter with same symptoms - pt is candidate for COVID medication  The patient does have symptoms concerning for COVID-19 infection (fever, chills, cough, or new shortness of breath).    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  ANCA antibodies- myeloperoxidase ABS-greater than 100, ANCA proteinase 3-less than 3.5 04/02/2019- ANCA titers- p-ANCA +1: 160, C ANCA-less than 1: 20, atypical p-ANCA titer-less than 1: 20   Abnormal CT of the chest    Abnormal findings on diagnostic imaging of lung 04/12/2019   04/01/2019-CT chest with contrast- no acute process in chest abdomen or pelvis, interstitial lung disease suspicious for early or mild UIP, pulmonary artery enlargement suggest PAH    Acute low back pain without  sciatica 03/08/2020   Acute low back pain without sciatica 03/08/2020   Acute lower UTI 07/07/2020   Acute pain of left knee 11/30/2019   AKI (acute kidney injury) ([place])    Alpha-1-antitrypsin deficiency carrier 05/04/2019   Anemia    Atherosclerotic heart disease of native coronary artery without angina pectoris 05/03/2018   Body mass index (BMI) 29.0-29.9, adult 01/05/2020   Bradycardia    Burning sensation of feet 01/05/2020   CKD (chronic kidney disease) 05/03/2018   Community acquired pneumonia    Community acquired pneumonia    Coronary artery disease    Cough    Diabetes ([place])    Diabetes mellitus type 2 in obese ([place]) 04/01/2019   Dyslipidemia associated with type 2 diabetes mellitus ([place]) 05/03/2018   Dysuria 09/07/2020   Elevated LFTs 04/01/2019   Elevated rheumatoid factor 04/12/2019   04/03/2019-rheumatoid factor-95.4   Emphysema (subcutaneous) (surgical) resulting from a procedure 05/03/2018   Essential hypertension 05/03/2018   Fatigue 03/29/2021   Fever    GAD (generalized anxiety disorder)    GERD (gastroesophageal reflux disease)    Hematuria 09/07/2020   History of alpha-1-antitrypsin deficiency 04/12/2019   History of kidney stones    Hyperlipidemia    Hypoxemia    Idiopathic pulmonary fibrosis ([place]) 03/29/2021   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[place]) 03/29/2021   Insomnia 05/03/2018   Interstitial pulmonary disease ([place]) 04/12/2019   04/01/2019-CT chest with contrast- no acute process in chest abdomen or pelvis, interstitial lung disease suspicious for early or mild UIP, pulmonary artery enlargement suggest PAH  04/02/2019-connective  tissue work-up: Anti-Jo 1-negative Anti-DNA antibody double-stranded-negative Anti-scleroderma antibody-negative Sjogren's syndrome antibody-negative Sjogren's syndrome antibody-negative CK-31 CCP-6 E   Leukocytosis 03/08/2020   Low vitamin B12 level 04/03/2019   Lower extremity edema    Medicare annual wellness visit, subsequent 08/09/2020    Microscopic polyangiitis ([place]) 03/29/2021   Mixed hyperlipidemia 05/03/2018   Myalgia 04/19/2019   Osteoarthritis    Other emphysema ([place])    Other long term (current) drug therapy 03/29/2021   Paresthesias in left hand 11/30/2019   Peripheral polyneuropathy 01/30/2020   Pneumonia 04/01/2019   Pneumonitis    Pneumonitis    Polymyositis ([place])    Primary insomnia    Proteinuria 09/07/2020   Renal insufficiency 09/06/2020   Renal stones    Rheumatoid factor positive 03/29/2021   Sepsis ([place]) 04/19/2019   Severe sepsis ([place]) 07/07/2020   Skin cancer    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/17/2020   Transaminitis    Trigger finger 05/03/2018   UC (ulcerative colitis) ([place])    Urinary urgency 09/07/2020   UTI (urinary tract infection)    Vasculitis ([place]) 03/29/2021   Past Surgical History:  Procedure Laterality Date   ANGIOPLASTY     BACK SURGERY     CATARACT EXTRACTION     COLONOSCOPY  06/16/2005   Mild colitis involving splenic flexure. Colonic polyps, status post polypectomy. Mild pancolonic diverticulitits. Internal hemorrhoids.    ESOPHAGOGASTRODUODENOSCOPY  04/26/2003   Irregular Z line suggestive of GERD. Mild gastritis status post CLO testing.    TOTAL KNEE ARTHROPLASTY Left 12/17/2020   Procedure: LEFT TOTAL KNEE ARTHROPLASTY;  Surgeon: Leandrew Koyanagi, MD;  Location: [facility];  Service: Orthopedics;  Laterality: Left;   TRIGGER FINGER RELEASE       Current Meds  Medication Sig   aspirin EC 81 MG tablet Take 1 tablet (81 mg total) by mouth 2 (two) times daily. To be taken after surgery   benzonatate (TESSALON) 100 MG capsule Take 1 capsule (100 mg total) by mouth 2 (two) times daily as needed for cough.   Coenzyme Q10 100 MG TABS Take 100 mg by mouth.   docusate sodium (COLACE) 100 MG capsule Take 100 mg by mouth 2 (two) times daily as needed for mild constipation.   furosemide (LASIX) 20 MG tablet Take 20 mg by mouth daily as needed for fluid.   gabapentin (NEURONTIN) 400 MG  capsule Take 400 mg by mouth 2 (two) times daily.   glucose blood (TRUE METRIX BLOOD GLUCOSE TEST) test strip E11.69 TEST BLOOD SUGAR THREE TIMES DAILY BEFORE MEALS   molnupiravir EUA (LAGEVRIO) 200 mg CAPS capsule Take 4 capsules (800 mg total) by mouth 2 (two) times daily for 5 days.   Multiple Vitamins-Minerals (PRESERVISION AREDS 2) CAPS Take 1 capsule by mouth 2 (two) times daily after a meal.   nitroGLYCERIN (NITROSTAT) 0.4 MG SL tablet Place 1 tablet (0.4 mg total) under the tongue every 5 (five) minutes as needed for chest pain.   Omega-3 Fatty Acids (FISH OIL) 1000 MG CAPS Take 2,000 mg by mouth daily.   omeprazole (PRILOSEC) 20 MG capsule Take 1 capsule (20 mg total) by mouth daily.   OVER THE COUNTER MEDICATION Stool softener twice a day   riTUXimab (RITUXAN) 100 MG/10ML injection Inject 100 mg into the vein every 6 (six) months.   rosuvastatin (CRESTOR) 20 MG tablet Take 1 tablet (20 mg total) by mouth daily.   vitamin B-12 (CYANOCOBALAMIN) 1000 MCG tablet Take 1 tablet (1,000 mcg total)  by mouth daily. (Patient taking differently: Take 1,000 mcg by mouth daily before breakfast.)     Allergies:   Ace inhibitors, Hydrocodone, Hydrocodone-acetaminophen, Nsaids, and Sulfamethoxazole-trimethoprim   Social History   Tobacco Use   Smoking status: Never   Smokeless tobacco: Never  Vaping Use   Vaping Use: Never used  Substance Use Topics   Alcohol use: Never   Drug use: Never     Family Hx: The patient's family history includes Clotting disorder in his brother; Heart attack in his sister; Lung disease in his sister; Pancreatic cancer in his sister; Stroke in his father; Tuberculosis in his mother. There is no history of Colon cancer or Esophageal cancer.  ROS:   Please see the history of present illness.    All other systems reviewed and are negative.  Labs/Other Tests and Data Reviewed:    Recent Labs: 04/04/2021: Hemoglobin 13.1; Platelets 218; TSH 2.830 05/09/2021: ALT 18;  BUN 17; Creatinine, Ser 1.21; Potassium 5.2; Sodium 145   Recent Lipid Panel Lab Results  Component Value Date/Time   CHOL 254 (H) 04/04/2021 08:33 AM   TRIG 129 04/04/2021 08:33 AM   HDL 36 (L) 04/04/2021 08:33 AM   CHOLHDL 7.1 (H) 04/04/2021 08:33 AM   LDLCALC 194 (H) 04/04/2021 08:33 AM    Wt Readings from Last 3 Encounters:  07/29/21 184 lb (83.5 kg)  05/09/21 189 lb (85.7 kg)  04/11/21 186 lb (84.4 kg)     Objective:    Vital Signs:  BP (!) 162/68 (BP Location: Left Arm, Patient Position: Sitting, Cuff Size: Normal)   Pulse 64   Temp (!) 101.4 F (38.6 C) (Oral)   Ht 5\' 8"  (1.727 m)   Wt 184 lb (83.5 kg)   SpO2 94%   BMI 27.98 kg/m    VITAL SIGNS:  reviewed GEN:  no acute distress  ASSESSMENT & PLAN:    COVID 19 - rx for tessalon and molnupiravir as directed - recommend rest and fluids - quarantine according to [employer] guidelines Hold crestor while taking medication COVID-19 Education: The signs and symptoms of COVID-19 were discussed with the patient and how to seek care for testing (follow up with PCP or arrange E-visit). The importance of social distancing was discussed today.  Time:   Today, I have spent 10 minutes with the patient with telehealth technology discussing the above problems.     Medication Adjustments/Labs and Tests Ordered: Current medicines are reviewed at length with the patient today.  Concerns regarding medicines are outlined above.   Tests Ordered: No orders of the defined types were placed in this encounter.   Medication Changes: Meds ordered this encounter  Medications   molnupiravir EUA (LAGEVRIO) 200 mg CAPS capsule    Sig: Take 4 capsules (800 mg total) by mouth 2 (two) times daily for 5 days.    Dispense:  40 capsule    Refill:  0    Order Specific Question:   Supervising Provider    Answer:   Shelton Silvas   benzonatate (TESSALON) 100 MG capsule    Sig: Take 1 capsule (100 mg total) by mouth 2 (two) times daily as  needed for cough.    Dispense:  20 capsule    Refill:  0    Order Specific Question:   Supervising Provider    AnswerShelton Silvas    Follow Up:  In Person prn  Signed, Webb Silversmith, PA-C  07/29/2021 4:21 PM  [place]

## 2021-07-30 ENCOUNTER — Telehealth: Payer: Self-pay

## 2021-07-30 NOTE — Chronic Care Management (AMB) (Signed)
Chronic Care Management Pharmacy Assistant   Name: Kerry Matthews  MRN: [id number] DOB: 01-06-1936   Reason for Encounter: Disease State call for DM    Recent office visits:  07/29/21 Video Visit. Marge Duncans PA-C. Seen for Covid 19. Started on Benzonatate 100 mg 2 times daily prn and Molnupiravir 800 mg 2 times daily.   Recent consult visits:  06/19/21 (Orthopedics) Frankey Shown MD. Seen for Left Knee Pain. No med changes.   Hospital visits:  None  Medications: Outpatient Encounter Medications as of 07/30/2021  Medication Sig   aspirin EC 81 MG tablet Take 1 tablet (81 mg total) by mouth 2 (two) times daily. To be taken after surgery   benzonatate (TESSALON) 100 MG capsule Take 1 capsule (100 mg total) by mouth 2 (two) times daily as needed for cough.   Coenzyme Q10 100 MG TABS Take 100 mg by mouth.   docusate sodium (COLACE) 100 MG capsule Take 100 mg by mouth 2 (two) times daily as needed for mild constipation.   furosemide (LASIX) 20 MG tablet Take 20 mg by mouth daily as needed for fluid.   gabapentin (NEURONTIN) 400 MG capsule Take 400 mg by mouth 2 (two) times daily.   glucose blood (TRUE METRIX BLOOD GLUCOSE TEST) test strip E11.69 TEST BLOOD SUGAR THREE TIMES DAILY BEFORE MEALS   molnupiravir EUA (LAGEVRIO) 200 mg CAPS capsule Take 4 capsules (800 mg total) by mouth 2 (two) times daily for 5 days.   Multiple Vitamins-Minerals (PRESERVISION AREDS 2) CAPS Take 1 capsule by mouth 2 (two) times daily after a meal.   nitroGLYCERIN (NITROSTAT) 0.4 MG SL tablet Place 1 tablet (0.4 mg total) under the tongue every 5 (five) minutes as needed for chest pain.   Omega-3 Fatty Acids (FISH OIL) 1000 MG CAPS Take 2,000 mg by mouth daily.   omeprazole (PRILOSEC) 20 MG capsule Take 1 capsule (20 mg total) by mouth daily.   OVER THE COUNTER MEDICATION Stool softener twice a day   riTUXimab (RITUXAN) 100 MG/10ML injection Inject 100 mg into the vein every 6 (six) months.   rosuvastatin  (CRESTOR) 20 MG tablet Take 1 tablet (20 mg total) by mouth daily.   vitamin B-12 (CYANOCOBALAMIN) 1000 MCG tablet Take 1 tablet (1,000 mcg total) by mouth daily. (Patient taking differently: Take 1,000 mcg by mouth daily before breakfast.)   No facility-administered encounter medications on file as of 07/30/2021.   Recent Relevant Labs: Lab Results  Component Value Date/Time   HGBA1C 6.7 (H) 04/04/2021 08:33 AM   HGBA1C 6.3 (H) 12/17/2020 01:35 PM   MICROALBUR 10 04/11/2021 11:54 AM   MICROALBUR 10 03/06/2020 03:07 PM    Kidney Function Lab Results  Component Value Date/Time   CREATININE 1.21 05/09/2021 12:04 PM   CREATININE 1.29 (H) 04/04/2021 08:33 AM   GFRNONAA 52 (L) 12/18/2020 06:03 AM   GFRAA 55 (L) 07/26/2020 09:53 AM     Current antihyperglycemic regimen:  Diet and Exercise   What recent interventions/DTPs have been made to improve glycemic control:  Pt stated no changes   Have there been any recent hospitalizations or ED visits since last visit with CPP? No  Patient denies hypoglycemic symptoms, including None  Patient denies hyperglycemic symptoms, including none  How often are you checking your blood sugar? once daily  What are your blood sugars ranging?  90-112  On insulin? No How many units:  During the week, how often does your blood glucose drop below 70? Never  Are you checking your feet daily/regularly? Yes  Pt stated he is just now getting over Covid and he is feeling much better. He stated Dr. Tobie Poet said pt did not need to be on medications for his sugars since they are running good. Pt is doing very well overall.  Adherence Review: Is the patient currently on a STATIN medication? Yes Is the patient currently on ACE/ARB medication? No Does the patient have >5 day gap between last estimated fill dates? CPP to review  Care Gaps: Last eye exam / Retinopathy Screening: 04/02/21 Last Annual Wellness Visit? None noted  Last Diabetic Foot Exam: Never  done    Star Rating Drugs:  Medication:  Last Fill: Day Supply None noted    [name], [place] [facility] Pharmacist Assistant  [phone]

## 2021-08-05 ENCOUNTER — Telehealth: Payer: Self-pay

## 2021-08-05 ENCOUNTER — Telehealth (INDEPENDENT_AMBULATORY_CARE_PROVIDER_SITE_OTHER): Payer: Medicare HMO | Admitting: Internal Medicine

## 2021-08-05 ENCOUNTER — Encounter: Payer: Self-pay | Admitting: Internal Medicine

## 2021-08-05 ENCOUNTER — Other Ambulatory Visit: Payer: Self-pay

## 2021-08-05 DIAGNOSIS — I2510 Atherosclerotic heart disease of native coronary artery without angina pectoris: Secondary | ICD-10-CM | POA: Diagnosis not present

## 2021-08-05 DIAGNOSIS — E7849 Other hyperlipidemia: Secondary | ICD-10-CM

## 2021-08-05 DIAGNOSIS — R7989 Other specified abnormal findings of blood chemistry: Secondary | ICD-10-CM | POA: Diagnosis not present

## 2021-08-05 NOTE — Telephone Encounter (Signed)
Wife calling for patient. Pt dx w/ COVID 12/05 treated with tessalon and molnupiravir. Finished medication Friday. Two days ago pt's bottom Rt gums in mouth have become sore. Continues to have headache. Denies rash in mouth. "Hurts to put teeth together. They are unsure of what to do. Please advise next step.   Routing to encounter provider and PCP.   Royce Macadamia, [place] 08/05/21 8:14 AM

## 2021-08-05 NOTE — Patient Instructions (Signed)
Medication Instructions:  Your physician recommends that you continue on your current medications as directed. Please refer to the Current Medication list given to you today.  *If you need a refill on your cardiac medications before your next appointment, please call your pharmacy*   Lab Work: NONE If you have labs (blood work) drawn today and your tests are completely normal, you will receive your results only by: [place] (if you have MyChart) OR A paper copy in the mail If you have any lab test that is abnormal or we need to change your treatment, we will call you to review the results.   Testing/Procedures: NONE   Follow-Up: At [facility], you and your health needs are our priority.  As part of our continuing mission to provide you with exceptional heart care, we have created designated Provider Care Teams.  These Care Teams include your primary Cardiologist (physician) and Advanced Practice Providers (APPs -  Physician Assistants and Nurse Practitioners) who all work together to provide you with the care you need, when you need it.  We recommend signing up for the patient portal called "MyChart".  Sign up information is provided on this After Visit Summary.  MyChart is used to connect with patients for Virtual Visits (Telemedicine).  Patients are able to view lab/test results, encounter notes, upcoming appointments, etc.  Non-urgent messages can be sent to your provider as well.   To learn more about what you can do with MyChart, go to [email or web address].    Your next appointment:   AS NEEDED with Dr. Debara Pickett for lipid management

## 2021-08-05 NOTE — Telephone Encounter (Signed)
Patient calling back. He has tried tylenol with no improvement. He is unable to rest. Would not rate pain, described pain as nagging. States it is going into forehead now.   Royce Macadamia, [place] 08/05/21 11:10 AM

## 2021-08-05 NOTE — Telephone Encounter (Signed)
Appointment made.   Kerry Matthews, [place] 08/05/21 11:52 AM

## 2021-08-05 NOTE — Progress Notes (Signed)
Virtual Visit via Video Note   This visit type was conducted due to national recommendations for restrictions regarding the COVID-19 Pandemic (e.g. social distancing) in an effort to limit this patient's exposure and mitigate transmission in our community.  Due to his co-morbid illnesses, this patient is at least at moderate risk for complications without adequate follow up.  This format is felt to be most appropriate for this patient at this time.  All issues noted in this document were discussed and addressed.  A limited physical exam was performed with this format.  Please refer to the patient's chart for his consent to telehealth for [facility].      Date:  08/05/2021   ID:  Kerry Matthews, DOB Dec 05, 1935, MRN [id number] The patient was identified using 2 identifiers.  Evaluation Performed:  New Patient Evaluation  Patient Location:  [place] [id number]  Provider location:   [address] [place] [address], [place] [address]  PCP:  [name], MD  Cardiologist:  None Electrophysiologist:  None   Chief Complaint: Manage dyslipidemia  History of Present Illness:    Kerry Matthews is a 85 y.o. male who presents via audio/video conferencing for a telehealth visit today.  This is a pleasant 85 year old male who was referred for evaluation management of dyslipidemia.  He has a history of very high cholesterol in the past as well as coronary disease with a stent about 15 years ago.  He had a CT scan of the chest which was high-resolution in 2021 that showed multivessel coronary calcification and aortic atherosclerosis.  Untreated cholesterol in the past has been as high as 313 about a year ago with LDL 249.  On treatment he was down to 111 with LDL however he could not tolerate atorvastatin due to elevated liver enzymes.  He was subsequently switched to rosuvastatin and seems to be tolerating it.  Liver enzymes have normalized.  Total cholesterol 254,  triglycerides 129, HDL 36 and LDL 194 where his labs in August prior to restarting on the rosuvastatin which he has been taking since then except for the past week since he was on antiviral therapy for COVID-19 infection.  He says he is getting over it and is feeling better.  The patient does not have symptoms concerning for COVID-19 infection (fever, chills, cough, or new SHORTNESS OF BREATH).    Prior CV studies:   The following studies were reviewed today:  Chart reviewed, lab work  PMHx:  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  ANCA antibodies- myeloperoxidase ABS-greater than 100, ANCA proteinase 3-less than 3.5 04/02/2019- ANCA titers- p-ANCA +1: 160, C ANCA-less than 1: 20, atypical p-ANCA titer-less than 1: 20   Abnormal CT of the chest    Abnormal findings on diagnostic imaging of lung 04/12/2019   04/01/2019-CT chest with contrast- no acute process in chest abdomen or pelvis, interstitial lung disease suspicious for early or mild UIP, pulmonary artery enlargement suggest PAH    Acute low back pain without sciatica 03/08/2020   Acute low back pain without sciatica 03/08/2020   Acute lower UTI 07/07/2020   Acute pain of left knee 11/30/2019   AKI (acute kidney injury) ([place])    Alpha-1-antitrypsin deficiency carrier 05/04/2019   Anemia    Atherosclerotic heart disease of native coronary artery without angina pectoris 05/03/2018   Body mass index (BMI) 29.0-29.9, adult 01/05/2020   Bradycardia    Burning sensation of  feet 01/05/2020   CKD (chronic kidney disease) 05/03/2018   Community acquired pneumonia    Community acquired pneumonia    Coronary artery disease    Cough    Diabetes ([place])    Diabetes mellitus type 2 in obese ([place]) 04/01/2019   Dyslipidemia associated with type 2 diabetes mellitus ([place]) 05/03/2018   Dysuria 09/07/2020   Elevated LFTs 04/01/2019   Elevated rheumatoid factor 04/12/2019   04/03/2019-rheumatoid  factor-95.4   Emphysema (subcutaneous) (surgical) resulting from a procedure 05/03/2018   Essential hypertension 05/03/2018   Fatigue 03/29/2021   Fever    GAD (generalized anxiety disorder)    GERD (gastroesophageal reflux disease)    Hematuria 09/07/2020   History of alpha-1-antitrypsin deficiency 04/12/2019   History of kidney stones    Hyperlipidemia    Hypoxemia    Idiopathic pulmonary fibrosis ([place]) 03/29/2021   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[place]) 03/29/2021   Insomnia 05/03/2018   Interstitial pulmonary disease ([place]) 04/12/2019   04/01/2019-CT chest with contrast- no acute process in chest abdomen or pelvis, interstitial lung disease suspicious for early or mild UIP, pulmonary artery enlargement suggest PAH  04/02/2019-connective tissue work-up: Anti-Jo 1-negative Anti-DNA antibody double-stranded-negative Anti-scleroderma antibody-negative Sjogren's syndrome antibody-negative Sjogren's syndrome antibody-negative CK-31 CCP-6 E   Leukocytosis 03/08/2020   Low vitamin B12 level 04/03/2019   Lower extremity edema    Medicare annual wellness visit, subsequent 08/09/2020   Microscopic polyangiitis ([employer]) 03/29/2021   Mixed hyperlipidemia 05/03/2018   Myalgia 04/19/2019   Osteoarthritis    Other emphysema ([place])    Other long term (current) drug therapy 03/29/2021   Paresthesias in left hand 11/30/2019   Peripheral polyneuropathy 01/30/2020   Pneumonia 04/01/2019   Pneumonitis    Pneumonitis    Polymyositis ([place])    Primary insomnia    Proteinuria 09/07/2020   Renal insufficiency 09/06/2020   Renal stones    Rheumatoid factor positive 03/29/2021   Sepsis ([place]) 04/19/2019   Severe sepsis ([place]) 07/07/2020   Skin cancer    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/17/2020   Transaminitis    Trigger finger 05/03/2018   UC (ulcerative colitis) ([place])    Urinary urgency 09/07/2020   UTI (urinary tract infection)    Vasculitis ([place]) 03/29/2021    Past Surgical History:  Procedure Laterality Date    ANGIOPLASTY     BACK SURGERY     CATARACT EXTRACTION     COLONOSCOPY  06/16/2005   Mild colitis involving splenic flexure. Colonic polyps, status post polypectomy. Mild pancolonic diverticulitits. Internal hemorrhoids.    ESOPHAGOGASTRODUODENOSCOPY  04/26/2003   Irregular Z line suggestive of GERD. Mild gastritis status post CLO testing.    TOTAL KNEE ARTHROPLASTY Left 12/17/2020   Procedure: LEFT TOTAL KNEE ARTHROPLASTY;  Surgeon: Leandrew Koyanagi, MD;  Location: [facility];  Service: Orthopedics;  Laterality: Left;   TRIGGER FINGER RELEASE      FAMHx:  Family History  Problem Relation Age of Onset   Tuberculosis Mother    Stroke Father    Pancreatic cancer Sister    Heart attack Sister    Lung disease Sister    Clotting disorder Brother    Colon cancer Neg Hx    Esophageal cancer Neg Hx     SOCHx:   reports that he has never smoked. He has never used smokeless tobacco. He reports that he does not drink alcohol and does not use drugs.  ALLERGIES:  Allergies  Allergen Reactions   Ace Inhibitors Other (See Comments)  Slow heart rate   Hydrocodone Nausea And Vomiting   Hydrocodone-Acetaminophen Nausea And Vomiting   Nsaids     Kidney issues   Sulfamethoxazole-Trimethoprim Nausea And Vomiting    MEDS:  Current Meds  Medication Sig   aspirin EC 81 MG tablet Take 1 tablet (81 mg total) by mouth 2 (two) times daily. To be taken after surgery   benzonatate (TESSALON) 100 MG capsule Take 1 capsule (100 mg total) by mouth 2 (two) times daily as needed for cough.   Coenzyme Q10 100 MG TABS Take 100 mg by mouth.   docusate sodium (COLACE) 100 MG capsule Take 100 mg by mouth 2 (two) times daily as needed for mild constipation.   furosemide (LASIX) 20 MG tablet Take 20 mg by mouth daily as needed for fluid.   gabapentin (NEURONTIN) 400 MG capsule Take 400 mg by mouth 2 (two) times daily.   glucose blood (TRUE METRIX BLOOD GLUCOSE TEST) test strip E11.69 TEST BLOOD SUGAR THREE TIMES  DAILY BEFORE MEALS   Multiple Vitamins-Minerals (PRESERVISION AREDS 2) CAPS Take 1 capsule by mouth 2 (two) times daily after a meal.   nitroGLYCERIN (NITROSTAT) 0.4 MG SL tablet Place 1 tablet (0.4 mg total) under the tongue every 5 (five) minutes as needed for chest pain.   Omega-3 Fatty Acids (FISH OIL) 1000 MG CAPS Take 2,000 mg by mouth daily.   omeprazole (PRILOSEC) 20 MG capsule Take 1 capsule (20 mg total) by mouth daily.   OVER THE COUNTER MEDICATION Stool softener twice a day   riTUXimab (RITUXAN) 100 MG/10ML injection Inject 100 mg into the vein every 6 (six) months.   rosuvastatin (CRESTOR) 20 MG tablet Take 1 tablet (20 mg total) by mouth daily.   vitamin B-12 (CYANOCOBALAMIN) 1000 MCG tablet Take 1 tablet (1,000 mcg total) by mouth daily. (Patient taking differently: Take 1,000 mcg by mouth daily before breakfast.)     ROS: Pertinent items noted in HPI and remainder of comprehensive ROS otherwise negative.  Labs/Other Tests and Data Reviewed:    Recent Labs: 04/04/2021: Hemoglobin 13.1; Platelets 218; TSH 2.830 05/09/2021: ALT 18; BUN 17; Creatinine, Ser 1.21; Potassium 5.2; Sodium 145   Recent Lipid Panel Lab Results  Component Value Date/Time   CHOL 254 (H) 04/04/2021 08:33 AM   TRIG 129 04/04/2021 08:33 AM   HDL 36 (L) 04/04/2021 08:33 AM   CHOLHDL 7.1 (H) 04/04/2021 08:33 AM   LDLCALC 194 (H) 04/04/2021 08:33 AM    Wt Readings from Last 3 Encounters:  07/29/21 184 lb (83.5 kg)  05/09/21 189 lb (85.7 kg)  04/11/21 186 lb (84.4 kg)     Exam:    Vital Signs:  There were no vitals taken for this visit.   General appearance: alert and no distress Lungs: Some cough Abdomen: Normal wound Extremities: extremities normal, atraumatic, no cyanosis or edema Skin: Skin color, texture, turgor normal. No rashes or lesions Neurologic: Grossly normal Psych: Pleasant  ASSESSMENT & PLAN:    Probable familial hyperlipidemia Coronary artery disease status post remote  PCI Multivessel coronary artery calcification Elevated liver enzymes on atorvastatin  Mr. Kintz has a probable familial hyperlipidemia.  Untreated LDL cholesterol has been as high as 249 about a year ago.  Unfortunately had elevated liver enzymes on atorvastatin and has been switched to rosuvastatin.  About a month after that his liver enzymes normalized.  He reports has been compliant with the medicine for about 3 months and should have repeat labs.  I offered to  order those today however his wife wanted to get the lab work done through Dr. Tobie Poet.  Would recommend repeat lipid profile, repeat vitamin D level since he was low and repeat liver enzymes or comprehensive metabolic profile.  His target LDL should be less than 70 and if he is not at goal on this therapy alone 1 would consider additional treatment options.  There may be some reluctance to do this however I like to follow-up with him after his labs to discuss it further  Thanks for the kind referral.  COVID-19 Education: The signs and symptoms of COVID-19 were discussed with the patient and how to seek care for testing (follow up with PCP or arrange E-visit).  The importance of social distancing was discussed today.  Patient Risk:   After full review of this patients clinical status, I feel that they are at least moderate risk at this time.  Time:   Today, I have spent 25 minutes with the patient with telehealth technology discussing dyslipidemia.     Medication Adjustments/Labs and Tests Ordered: Current medicines are reviewed at length with the patient today.  Concerns regarding medicines are outlined above.   Tests Ordered: No orders of the defined types were placed in this encounter.   Medication Changes: No orders of the defined types were placed in this encounter.   Disposition:  prn  Pixie Casino, MD, FACC, [place] Director of the [facility]  [facility]  [facility] Diplomate of the American Board of Clinical Lipidology Attending Cardiologist  Direct Dial: [phone]  Fax: [phone]  Website:  [email or web address]  Pixie Casino, MD  08/05/2021 9:27 AM

## 2021-08-06 ENCOUNTER — Ambulatory Visit (INDEPENDENT_AMBULATORY_CARE_PROVIDER_SITE_OTHER): Payer: Medicare HMO | Admitting: Family Medicine

## 2021-08-06 ENCOUNTER — Encounter: Payer: Self-pay | Admitting: Family Medicine

## 2021-08-06 VITALS — BP 120/58 | HR 78 | Temp 97.5°F | Resp 16 | Ht 68.0 in | Wt 191.0 lb

## 2021-08-06 DIAGNOSIS — E782 Mixed hyperlipidemia: Secondary | ICD-10-CM | POA: Diagnosis not present

## 2021-08-06 DIAGNOSIS — M317 Microscopic polyangiitis: Secondary | ICD-10-CM

## 2021-08-06 DIAGNOSIS — I119 Hypertensive heart disease without heart failure: Secondary | ICD-10-CM

## 2021-08-06 DIAGNOSIS — E1142 Type 2 diabetes mellitus with diabetic polyneuropathy: Secondary | ICD-10-CM | POA: Diagnosis not present

## 2021-08-06 DIAGNOSIS — I2510 Atherosclerotic heart disease of native coronary artery without angina pectoris: Secondary | ICD-10-CM

## 2021-08-06 NOTE — Progress Notes (Signed)
Subjective:  Patient ID: Kerry Matthews, male    DOB: August 11, 1936  Age: 85 y.o. MRN: [id number]  Chief Complaint  Patient presents with   Neck Pain   gum pain    HPI Diabetes:  Complications: neuropathy Glucose checking: daily Glucose logs: 80-115 Hypoglycemia: no Most recent A1C: 6.7 Current medications: gabapentin 400 mg one twice a day.  Last Eye Exam: 04/02/2021 Foot checks: daily  Hyperlipidemia: Current medications: rosuvastatin 20 mg once daily and fish oil 2000 mg daily.   Hypertensive heart disease. Has history of stent  Complications: Current medications: lasix 20 mg once daily prn, aspirin and crestor 20 mg once daily. Also otc fish oil 1000 mg 2 daily.   Diet: eating healthy Exercise: active. Yardwork, etc.   Microscopic polyangitis: on rituxin 100 mg every 6 months.   Patient diagnosed with COVID beginning of last week.  He took Paxil bid.  He was complaining yesterday of having abdominal pain on his lower gums.  He uses Tylenol and warm salt water swishes and gargles.  Today his complaints have fully resolved.  Denies fevers, chills, coughing.  Current Outpatient Medications on File Prior to Visit  Medication Sig Dispense Refill   aspirin EC 81 MG tablet Take 1 tablet (81 mg total) by mouth 2 (two) times daily. To be taken after surgery 84 tablet 0   Coenzyme Q10 100 MG TABS Take 100 mg by mouth.     docusate sodium (COLACE) 100 MG capsule Take 100 mg by mouth 2 (two) times daily as needed for mild constipation.     furosemide (LASIX) 20 MG tablet Take 20 mg by mouth daily as needed for fluid.     gabapentin (NEURONTIN) 400 MG capsule Take 400 mg by mouth 2 (two) times daily.     glucose blood (TRUE METRIX BLOOD GLUCOSE TEST) test strip E11.69 TEST BLOOD SUGAR THREE TIMES DAILY BEFORE MEALS 300 strip 3   Multiple Vitamins-Minerals (PRESERVISION AREDS 2) CAPS Take 1 capsule by mouth 2 (two) times daily after a meal.     nitroGLYCERIN (NITROSTAT) 0.4 MG SL tablet  Place 1 tablet (0.4 mg total) under the tongue every 5 (five) minutes as needed for chest pain. 25 tablet 6   Omega-3 Fatty Acids (FISH OIL) 1000 MG CAPS Take 2,000 mg by mouth daily.     omeprazole (PRILOSEC) 20 MG capsule Take 1 capsule (20 mg total) by mouth daily. 90 capsule 1   OVER THE COUNTER MEDICATION Stool softener twice a day     riTUXimab (RITUXAN) 100 MG/10ML injection Inject 100 mg into the vein every 6 (six) months.     rosuvastatin (CRESTOR) 20 MG tablet Take 1 tablet (20 mg total) by mouth daily. 90 tablet 3   vitamin B-12 (CYANOCOBALAMIN) 1000 MCG tablet Take 1 tablet (1,000 mcg total) by mouth daily. (Patient taking differently: Take 1,000 mcg by mouth daily before breakfast.) 30 tablet 0   No current facility-administered medications on file prior to visit.   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  ANCA antibodies- myeloperoxidase ABS-greater than 100, ANCA proteinase 3-less than 3.5 04/02/2019- ANCA titers- p-ANCA +1: 160, C ANCA-less than 1: 20, atypical p-ANCA titer-less than 1: 20   Abnormal CT of the chest    Abnormal findings on diagnostic imaging of lung 04/12/2019   04/01/2019-CT chest with contrast- no acute process in chest abdomen or pelvis, interstitial lung disease suspicious for early or mild UIP,  pulmonary artery enlargement suggest PAH    Acute low back pain without sciatica 03/08/2020   Acute low back pain without sciatica 03/08/2020   Acute lower UTI 07/07/2020   Acute pain of left knee 11/30/2019   AKI (acute kidney injury) ([place])    Alpha-1-antitrypsin deficiency carrier 05/04/2019   Anemia    Atherosclerotic heart disease of native coronary artery without angina pectoris 05/03/2018   Body mass index (BMI) 29.0-29.9, adult 01/05/2020   Bradycardia    Burning sensation of feet 01/05/2020   CKD (chronic kidney disease) 05/03/2018   Community acquired pneumonia    Community acquired pneumonia    Coronary  artery disease    Cough    Diabetes ([place])    Diabetes mellitus type 2 in obese ([place]) 04/01/2019   Dyslipidemia associated with type 2 diabetes mellitus ([place]) 05/03/2018   Dysuria 09/07/2020   Elevated LFTs 04/01/2019   Elevated rheumatoid factor 04/12/2019   04/03/2019-rheumatoid factor-95.4   Emphysema (subcutaneous) (surgical) resulting from a procedure 05/03/2018   Essential hypertension 05/03/2018   Fatigue 03/29/2021   Fever    GAD (generalized anxiety disorder)    GERD (gastroesophageal reflux disease)    Hematuria 09/07/2020   History of alpha-1-antitrypsin deficiency 04/12/2019   History of kidney stones    Hyperlipidemia    Hypoxemia    Idiopathic pulmonary fibrosis ([place]) 03/29/2021   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   Insomnia 05/03/2018   Interstitial pulmonary disease ([place]) 04/12/2019   04/01/2019-CT chest with contrast- no acute process in chest abdomen or pelvis, interstitial lung disease suspicious for early or mild UIP, pulmonary artery enlargement suggest PAH  04/02/2019-connective tissue work-up: Anti-Jo 1-negative Anti-DNA antibody double-stranded-negative Anti-scleroderma antibody-negative Sjogren's syndrome antibody-negative Sjogren's syndrome antibody-negative CK-31 CCP-6 E   Leukocytosis 03/08/2020   Low vitamin B12 level 04/03/2019   Lower extremity edema    Medicare annual wellness visit, subsequent 08/09/2020   Microscopic polyangiitis ([place]) 03/29/2021   Mixed hyperlipidemia 05/03/2018   Myalgia 04/19/2019   Osteoarthritis    Other emphysema ([place])    Other long term (current) drug therapy 03/29/2021   Paresthesias in left hand 11/30/2019   Peripheral polyneuropathy 01/30/2020   Pneumonia 04/01/2019   Pneumonitis    Pneumonitis    Polymyositis ([place])    Primary insomnia    Proteinuria 09/07/2020   Renal insufficiency 09/06/2020   Renal stones    Rheumatoid factor positive 03/29/2021   Sepsis ([place]) 04/19/2019   Severe sepsis ([place]) 07/07/2020   Skin  cancer    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/17/2020   Transaminitis    Trigger finger 05/03/2018   UC (ulcerative colitis) ([place])    Urinary urgency 09/07/2020   UTI (urinary tract infection)    Vasculitis ([place]) 03/29/2021   Past Surgical History:  Procedure Laterality Date   ANGIOPLASTY  2010   BACK SURGERY     CATARACT EXTRACTION     COLONOSCOPY  06/16/2005   Mild colitis involving splenic flexure. Colonic polyps, status post polypectomy. Mild pancolonic diverticulitits. Internal hemorrhoids.    ESOPHAGOGASTRODUODENOSCOPY  04/26/2003   Irregular Z line suggestive of GERD. Mild gastritis status post CLO testing.    TOTAL KNEE ARTHROPLASTY Left 12/17/2020   Procedure: LEFT TOTAL KNEE ARTHROPLASTY;  Surgeon: Leandrew Koyanagi, MD;  Location: [facility];  Service: Orthopedics;  Laterality: Left;   TRIGGER FINGER RELEASE      Family History  Problem Relation Age of Onset   Tuberculosis Mother    Stroke Father  Pancreatic cancer Sister    Heart attack Sister    Lung disease Sister    Clotting disorder Brother    Colon cancer Neg Hx    Esophageal cancer Neg Hx    Social History   Socioeconomic History   Marital status: Married    Spouse name: Not on file   Number of children: 2   Years of education: Not on file   Highest education level: Not on file  Occupational History   Occupation: retired  Tobacco Use   Smoking status: Never   Smokeless tobacco: Never  Vaping Use   Vaping Use: Never used  Substance and Sexual Activity   Alcohol use: Never   Drug use: Never   Sexual activity: Not on file  Other Topics Concern   Not on file  Social History Narrative   Not on file   Social Determinants of Health   Financial Resource Strain: Not on file  Food Insecurity: Not on file  Transportation Needs: Not on file  Physical Activity: Not on file  Stress: Not on file  Social Connections: Not on file    Review of Systems  Constitutional:  Positive for fatigue.  HENT:   Negative for ear pain, rhinorrhea, sinus pressure and sore throat.   Respiratory:  Positive for cough.   Cardiovascular:  Negative for chest pain and palpitations.  Gastrointestinal:  Negative for abdominal pain.  Endocrine: Negative for polydipsia, polyphagia and polyuria.  Genitourinary:  Negative for dysuria.  Musculoskeletal:  Positive for neck pain.  Psychiatric/Behavioral:  Positive for dysphoric mood. The patient is nervous/anxious.     Objective:  BP (!) 120/58   Pulse 78   Temp (!) 97.5 F (36.4 C)   Resp 16   Ht 5\' 8"  (1.727 m)   Wt 191 lb (86.6 kg)   BMI 29.04 kg/m   BP/Weight 08/06/2021 07/29/2021 11/25/4740  Systolic BP 595 638 756  Diastolic BP 58 68 60  Wt. (Lbs) 191 184 189  BMI 29.04 27.98 28.74    Physical Exam Vitals reviewed.  Constitutional:      Appearance: Normal appearance.  HENT:     Mouth/Throat:     Mouth: Mucous membranes are moist.  Cardiovascular:     Rate and Rhythm: Normal rate and regular rhythm.     Heart sounds: Normal heart sounds.  Pulmonary:     Effort: Pulmonary effort is normal.     Breath sounds: Normal breath sounds.  Abdominal:     General: Bowel sounds are normal.     Palpations: Abdomen is soft.     Tenderness: There is no abdominal tenderness.  Musculoskeletal:     Cervical back: Neck supple. No tenderness.  Neurological:     Mental Status: He is alert and oriented to person, place, and time.  Psychiatric:        Mood and Affect: Mood normal.        Behavior: Behavior normal.    Diabetic Foot Exam - Simple   Simple Foot Form Diabetic Foot exam was performed with the following findings: Yes 08/06/2021  9:29 AM  Visual Inspection See comments: Yes Sensation Testing See comments: Yes Pulse Check Posterior Tibialis and Dorsalis pulse intact bilaterally: Yes Comments Calluses, thickened nails. No sores.  Decreased sensation of feet.       Lab Results  Component Value Date   WBC 7.1 04/04/2021   HGB 13.1  04/04/2021   HCT 38.8 04/04/2021   PLT 218 04/04/2021   GLUCOSE  93 05/09/2021   CHOL 254 (H) 04/04/2021   TRIG 129 04/04/2021   HDL 36 (L) 04/04/2021   LDLCALC 194 (H) 04/04/2021   ALT 18 05/09/2021   AST 23 05/09/2021   NA 145 (H) 05/09/2021   K 5.2 05/09/2021   CL 105 05/09/2021   CREATININE 1.21 05/09/2021   BUN 17 05/09/2021   CO2 24 05/09/2021   TSH 2.830 04/04/2021   INR 1.1 12/13/2020   HGBA1C 6.7 (H) 04/04/2021   MICROALBUR 10 04/11/2021      Assessment & Plan:   Problem List Items Addressed This Visit       Cardiovascular and Mediastinum   Hypertensive heart disease without heart failure    The current medical regimen is effective;  continue present plan and medications.       Relevant Orders   CBC with Differential/Platelet   Comprehensive metabolic panel   Microscopic polyangiitis ([place]) - Primary    Return to Dr. Trudie Reed office for rituxin.       Coronary artery disease     Endocrine   Diabetic polyneuropathy associated with type 2 diabetes mellitus (HCC)    Control: good Recommend check sugars fasting daily. Recommend check feet daily. Recommend annual eye exams. Medicines: none Continue to work on eating a healthy diet and exercise.  Labs drawn today.         Relevant Orders   Hemoglobin A1c     Other   Mixed hyperlipidemia    Well controlled.  No changes to medicines.  Continue to work on eating a healthy diet and exercise.  Labs drawn today.       Relevant Orders   Lipid panel  .  No orders of the defined types were placed in this encounter.   Orders Placed This Encounter  Procedures   CBC with Differential/Platelet   Comprehensive metabolic panel   Hemoglobin A1c   Lipid panel      Follow-up: Return in about 1 day (around 08/07/2021) for fasting labs.  An After Visit Summary was printed and given to the patient.  [name], MD [facility] [phone]

## 2021-08-06 NOTE — Assessment & Plan Note (Signed)
Control: good Recommend check sugars fasting daily. Recommend check feet daily. Recommend annual eye exams. Medicines: none Continue to work on eating a healthy diet and exercise.  Labs drawn today.    

## 2021-08-06 NOTE — Assessment & Plan Note (Signed)
Return to Dr. Trudie Reed office for rituxin.

## 2021-08-06 NOTE — Assessment & Plan Note (Signed)
The current medical regimen is effective;  continue present plan and medications.  

## 2021-08-06 NOTE — Assessment & Plan Note (Signed)
Well controlled.  ?No changes to medicines.  ?Continue to work on eating a healthy diet and exercise.  ?Labs drawn today.  ?

## 2021-08-07 ENCOUNTER — Other Ambulatory Visit: Payer: Medicare HMO

## 2021-08-07 DIAGNOSIS — E782 Mixed hyperlipidemia: Secondary | ICD-10-CM | POA: Diagnosis not present

## 2021-08-07 DIAGNOSIS — E1142 Type 2 diabetes mellitus with diabetic polyneuropathy: Secondary | ICD-10-CM | POA: Diagnosis not present

## 2021-08-07 DIAGNOSIS — I119 Hypertensive heart disease without heart failure: Secondary | ICD-10-CM | POA: Diagnosis not present

## 2021-08-08 LAB — CBC WITH DIFFERENTIAL/PLATELET
Basophils Absolute: 0 10*3/uL (ref 0.0–0.2)
Basos: 0 %
EOS (ABSOLUTE): 0.1 10*3/uL (ref 0.0–0.4)
Eos: 1 %
Hematocrit: 42.8 % (ref 37.5–51.0)
Hemoglobin: 14.3 g/dL (ref 13.0–17.7)
Immature Grans (Abs): 0 10*3/uL (ref 0.0–0.1)
Immature Granulocytes: 0 %
Lymphocytes Absolute: 2 10*3/uL (ref 0.7–3.1)
Lymphs: 28 %
MCH: 28.4 pg (ref 26.6–33.0)
MCHC: 33.4 g/dL (ref 31.5–35.7)
MCV: 85 fL (ref 79–97)
Monocytes Absolute: 1 10*3/uL — ABNORMAL HIGH (ref 0.1–0.9)
Monocytes: 14 %
Neutrophils Absolute: 4.2 10*3/uL (ref 1.4–7.0)
Neutrophils: 57 %
Platelets: 235 10*3/uL (ref 150–450)
RBC: 5.03 x10E6/uL (ref 4.14–5.80)
RDW: 11.9 % (ref 11.6–15.4)
WBC: 7.4 10*3/uL (ref 3.4–10.8)

## 2021-08-08 LAB — LIPID PANEL
Chol/HDL Ratio: 5.3 ratio — ABNORMAL HIGH (ref 0.0–5.0)
Cholesterol, Total: 158 mg/dL (ref 100–199)
HDL: 30 mg/dL — ABNORMAL LOW (ref >39)
LDL Chol Calc (NIH): 106 mg/dL — ABNORMAL HIGH (ref 0–99)
Triglycerides: 122 mg/dL (ref 0–149)
VLDL Cholesterol Cal: 22 mg/dL (ref 5–40)

## 2021-08-08 LAB — HEMOGLOBIN A1C
Est. average glucose Bld gHb Est-mCnc: 134 mg/dL
Hgb A1c MFr Bld: 6.3 % — ABNORMAL HIGH (ref 4.8–5.6)

## 2021-08-08 LAB — COMPREHENSIVE METABOLIC PANEL
ALT: 13 IU/L (ref 0–44)
AST: 21 IU/L (ref 0–40)
Albumin/Globulin Ratio: 1.9 (ref 1.2–2.2)
Albumin: 3.9 g/dL (ref 3.6–4.6)
Alkaline Phosphatase: 140 IU/L — ABNORMAL HIGH (ref 44–121)
BUN/Creatinine Ratio: 11 (ref 10–24)
BUN: 14 mg/dL (ref 8–27)
Bilirubin Total: 0.6 mg/dL (ref 0.0–1.2)
CO2: 21 mmol/L (ref 20–29)
Calcium: 8.3 mg/dL — ABNORMAL LOW (ref 8.6–10.2)
Chloride: 100 mmol/L (ref 96–106)
Creatinine, Ser: 1.23 mg/dL (ref 0.76–1.27)
Globulin, Total: 2.1 g/dL (ref 1.5–4.5)
Glucose: 114 mg/dL — ABNORMAL HIGH (ref 70–99)
Potassium: 4.4 mmol/L (ref 3.5–5.2)
Sodium: 139 mmol/L (ref 134–144)
Total Protein: 6 g/dL (ref 6.0–8.5)
eGFR: 58 mL/min/{1.73_m2} — ABNORMAL LOW (ref >59)

## 2021-08-08 LAB — CARDIOVASCULAR RISK ASSESSMENT

## 2021-08-09 ENCOUNTER — Ambulatory Visit (INDEPENDENT_AMBULATORY_CARE_PROVIDER_SITE_OTHER): Payer: Medicare HMO | Admitting: Family Medicine

## 2021-08-09 VITALS — BP 110/72 | HR 80 | Temp 97.4°F | Resp 16 | Wt 189.0 lb

## 2021-08-09 DIAGNOSIS — J189 Pneumonia, unspecified organism: Secondary | ICD-10-CM

## 2021-08-09 DIAGNOSIS — U071 COVID-19: Secondary | ICD-10-CM | POA: Diagnosis not present

## 2021-08-09 DIAGNOSIS — R918 Other nonspecific abnormal finding of lung field: Secondary | ICD-10-CM | POA: Diagnosis not present

## 2021-08-09 DIAGNOSIS — J849 Interstitial pulmonary disease, unspecified: Secondary | ICD-10-CM | POA: Diagnosis not present

## 2021-08-09 MED ORDER — CEFTRIAXONE SODIUM 1 G IJ SOLR
1.0000 g | Freq: Once | INTRAMUSCULAR | Status: AC
  Administered 2021-08-09: 1 g via INTRAMUSCULAR

## 2021-08-09 MED ORDER — AZITHROMYCIN 250 MG PO TABS: ORAL_TABLET | ORAL | 0 refills | Status: DC

## 2021-08-09 MED ORDER — CEFDINIR 300 MG PO CAPS: 300.0000 mg | ORAL_CAPSULE | Freq: Two times a day (BID) | ORAL | 0 refills | Status: DC

## 2021-08-09 NOTE — Patient Instructions (Signed)
Sent cefdinir and azithomax (antibiotics) Rest! Drink lots of fluids! Eat 3 meals per day.

## 2021-08-09 NOTE — Progress Notes (Signed)
Subjective:  Patient ID: Kerry Matthews, male    DOB: 05-02-1936  Age: 85 y.o. MRN: [id number]  Chief Complaint  Patient presents with   Fever   Cough   Fatigue    HPI Covid 19 positive.  Pt having fever, fatigue, headaches, chills, poor appetite, cough, sob x 10 days. No chest pain. Completed monupiravir. Had felt better, but than got worse again.   Current Outpatient Medications on File Prior to Visit  Medication Sig Dispense Refill   aspirin EC 81 MG tablet Take 1 tablet (81 mg total) by mouth 2 (two) times daily. To be taken after surgery (Patient taking differently: Take 81 mg by mouth in the morning.) 84 tablet 0   Coenzyme Q10 100 MG TABS Take 100 mg by mouth at bedtime.     docusate sodium (COLACE) 100 MG capsule Take 100 mg by mouth 2 (two) times daily as needed for mild constipation. (Patient not taking: Reported on 08/12/2021)     furosemide (LASIX) 20 MG tablet Take 20 mg by mouth daily as needed for fluid.     gabapentin (NEURONTIN) 400 MG capsule Take 400 mg by mouth 2 (two) times daily.     glucose blood (TRUE METRIX BLOOD GLUCOSE TEST) test strip E11.69 TEST BLOOD SUGAR THREE TIMES DAILY BEFORE MEALS 300 strip 3   Multiple Vitamins-Minerals (PRESERVISION AREDS 2) CAPS Take 1 capsule by mouth 2 (two) times daily after a meal.     nitroGLYCERIN (NITROSTAT) 0.4 MG SL tablet Place 1 tablet (0.4 mg total) under the tongue every 5 (five) minutes as needed for chest pain. 25 tablet 6   Omega-3 Fatty Acids (FISH OIL) 1000 MG CAPS Take 2,000 mg by mouth daily.     omeprazole (PRILOSEC) 20 MG capsule Take 1 capsule (20 mg total) by mouth daily. (Patient taking differently: Take 20 mg by mouth daily before breakfast.) 90 capsule 1   rosuvastatin (CRESTOR) 20 MG tablet Take 1 tablet (20 mg total) by mouth daily. (Patient taking differently: Take 20 mg by mouth at bedtime.) 90 tablet 3   vitamin B-12 (CYANOCOBALAMIN) 1000 MCG tablet Take 1 tablet (1,000 mcg total) by mouth daily.  (Patient taking differently: Take 1,000 mcg by mouth daily with breakfast.) 30 tablet 0   No current facility-administered medications on file prior to visit.   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  ANCA antibodies- myeloperoxidase ABS-greater than 100, ANCA proteinase 3-less than 3.5 04/02/2019- ANCA titers- p-ANCA +1: 160, C ANCA-less than 1: 20, atypical p-ANCA titer-less than 1: 20   Abnormal CT of the chest    Abnormal findings on diagnostic imaging of lung 04/12/2019   04/01/2019-CT chest with contrast- no acute process in chest abdomen or pelvis, interstitial lung disease suspicious for early or mild UIP, pulmonary artery enlargement suggest PAH    Acute low back pain without sciatica 03/08/2020   Acute low back pain without sciatica 03/08/2020   Acute lower UTI 07/07/2020   Acute pain of left knee 11/30/2019   AKI (acute kidney injury) ([place])    Alpha-1-antitrypsin deficiency carrier 05/04/2019   Anemia    Atherosclerotic heart disease of native coronary artery without angina pectoris 05/03/2018   Body mass index (BMI) 29.0-29.9, adult 01/05/2020   Bradycardia    Burning sensation of feet 01/05/2020   CKD (chronic kidney disease) 05/03/2018   Community acquired pneumonia    Community acquired pneumonia    Coronary artery disease  Cough    Diabetes ([place])    Diabetes mellitus type 2 in obese ([place]) 04/01/2019   Dyslipidemia associated with type 2 diabetes mellitus ([place]) 05/03/2018   Dysuria 09/07/2020   Elevated LFTs 04/01/2019   Elevated rheumatoid factor 04/12/2019   04/03/2019-rheumatoid factor-95.4   Emphysema (subcutaneous) (surgical) resulting from a procedure 05/03/2018   Essential hypertension 05/03/2018   Fatigue 03/29/2021   Fever    GAD (generalized anxiety disorder)    GERD (gastroesophageal reflux disease)    Hematuria 09/07/2020   History of alpha-1-antitrypsin deficiency 04/12/2019   History of kidney stones     Hyperlipidemia    Hypoxemia    Idiopathic pulmonary fibrosis ([place]) 03/29/2021   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   Insomnia 05/03/2018   Interstitial pulmonary disease ([place]) 04/12/2019   04/01/2019-CT chest with contrast- no acute process in chest abdomen or pelvis, interstitial lung disease suspicious for early or mild UIP, pulmonary artery enlargement suggest PAH  04/02/2019-connective tissue work-up: Anti-Jo 1-negative Anti-DNA antibody double-stranded-negative Anti-scleroderma antibody-negative Sjogren's syndrome antibody-negative Sjogren's syndrome antibody-negative CK-31 CCP-6 E   Leukocytosis 03/08/2020   Low vitamin B12 level 04/03/2019   Lower extremity edema    Medicare annual wellness visit, subsequent 08/09/2020   Microscopic polyangiitis ([place]) 03/29/2021   Mixed hyperlipidemia 05/03/2018   Myalgia 04/19/2019   Osteoarthritis    Other emphysema ([place])    Other long term (current) drug therapy 03/29/2021   Paresthesias in left hand 11/30/2019   Peripheral polyneuropathy 01/30/2020   Pneumonia 04/01/2019   Pneumonitis    Pneumonitis    Polymyositis ([place])    Primary insomnia    Proteinuria 09/07/2020   Renal insufficiency 09/06/2020   Renal stones    Rheumatoid factor positive 03/29/2021   Sepsis ([place]) 04/19/2019   Severe sepsis ([place]) 07/07/2020   Skin cancer    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/17/2020   Transaminitis    Trigger finger 05/03/2018   UC (ulcerative colitis) ([place])    Urinary urgency 09/07/2020   UTI (urinary tract infection)    Vasculitis ([place]) 03/29/2021   Past Surgical History:  Procedure Laterality Date   ANGIOPLASTY  2010   BACK SURGERY     CATARACT EXTRACTION     COLONOSCOPY  06/16/2005   Mild colitis involving splenic flexure. Colonic polyps, status post polypectomy. Mild pancolonic diverticulitits. Internal hemorrhoids.    ESOPHAGOGASTRODUODENOSCOPY  04/26/2003   Irregular Z line suggestive of GERD. Mild gastritis status post CLO  testing.    TOTAL KNEE ARTHROPLASTY Left 12/17/2020   Procedure: LEFT TOTAL KNEE ARTHROPLASTY;  Surgeon: Leandrew Koyanagi, MD;  Location: [facility];  Service: Orthopedics;  Laterality: Left;   TRIGGER FINGER RELEASE      Family History  Problem Relation Age of Onset   Tuberculosis Mother    Stroke Father    Pancreatic cancer Sister    Heart attack Sister    Lung disease Sister    Clotting disorder Brother    Colon cancer Neg Hx    Esophageal cancer Neg Hx    Social History   Socioeconomic History   Marital status: Married    Spouse name: Not on file   Number of children: 2   Years of education: Not on file   Highest education level: Not on file  Occupational History   Occupation: retired  Tobacco Use   Smoking status: Never   Smokeless tobacco: Never  Vaping Use   Vaping Use: Never used  Substance and Sexual Activity  Alcohol use: Never   Drug use: Never   Sexual activity: Not on file  Other Topics Concern   Not on file  Social History Narrative   Not on file   Social Determinants of Health   Financial Resource Strain: Not on file  Food Insecurity: Not on file  Transportation Needs: Not on file  Physical Activity: Not on file  Stress: Not on file  Social Connections: Not on file    Review of Systems  Constitutional:  Positive for chills, fatigue and fever.  HENT:  Positive for congestion.   Gastrointestinal:  Positive for diarrhea.  Neurological:  Positive for headaches.    Objective:  BP 110/72    Pulse 80    Temp (!) 97.4 F (36.3 C)    Resp 16    Wt 189 lb (85.7 kg)    BMI 28.74 kg/m   BP/Weight 08/25/2021 08/13/2021 81/85/6314  Systolic BP 970 - 263  Diastolic BP 59 - 72  Wt. (Lbs) - 189 189  BMI - 29.6 28.74    Physical Exam Constitutional:      Appearance: Normal appearance.  HENT:     Right Ear: Tympanic membrane, ear canal and external ear normal.     Left Ear: Tympanic membrane, ear canal and external ear normal.     Nose: Nose normal. No  congestion or rhinorrhea.     Mouth/Throat:     Mouth: Mucous membranes are moist.     Pharynx: No oropharyngeal exudate or posterior oropharyngeal erythema.  Cardiovascular:     Rate and Rhythm: Normal rate and regular rhythm.     Heart sounds: Normal heart sounds.  Pulmonary:     Effort: Pulmonary effort is normal. No respiratory distress.     Breath sounds: Rhonchi present. No wheezing or rales.  Lymphadenopathy:     Cervical: No cervical adenopathy.  Neurological:     Mental Status: He is alert.  Psychiatric:        Mood and Affect: Mood normal.        Behavior: Behavior normal.    Diabetic Foot Exam - Simple   No data filed      Lab Results  Component Value Date   WBC 18.5 (H) 08/22/2021   HGB 11.9 (L) 08/22/2021   HCT 34.0 (L) 08/22/2021   PLT 380 08/22/2021   GLUCOSE 165 (H) 08/23/2021   CHOL 158 08/07/2021   TRIG 122 08/07/2021   HDL 30 (L) 08/07/2021   LDLCALC 106 (H) 08/07/2021   ALT 91 (H) 08/20/2021   AST 58 (H) 08/20/2021   NA 135 08/23/2021   K 5.0 08/23/2021   CL 106 08/23/2021   CREATININE 1.06 08/23/2021   BUN 33 (H) 08/23/2021   CO2 24 08/23/2021   TSH 2.830 04/04/2021   INR 1.1 12/13/2020   HGBA1C 6.3 (H) 08/07/2021   MICROALBUR 10 04/11/2021      Assessment & Plan:   Problem List Items Addressed This Visit       Respiratory   Pneumonia of both lungs due to infectious organism    Rocephin shot given.  Sent cefdinir and azithomax (antibiotics) Rest! Drink lots of fluids! Eat 3 meals per day. If worsening breathing, go to ED.      Relevant Orders   POCT Influenza A/B (Completed)   Other Visit Diagnoses     Pneumonia of both lower lobes due to infectious organism    -  Primary   Relevant Medications   cefTRIAXone (ROCEPHIN)  injection 1 g (Completed)   Other Relevant Orders   POCT Influenza A/B (Completed)     .  Meds ordered this encounter  Medications   DISCONTD: cefdinir (OMNICEF) 300 MG capsule    Sig: Take 1  capsule (300 mg total) by mouth 2 (two) times daily.    Dispense:  20 capsule    Refill:  0   DISCONTD: azithromycin (ZITHROMAX) 250 MG tablet    Sig: 2 DAILY FOR FIRST DAY, THEN DECREASE TO ONE DAILY FOR 4 MORE DAYS.    Dispense:  6 tablet    Refill:  0   cefTRIAXone (ROCEPHIN) injection 1 g    Orders Placed This Encounter  Procedures   POCT Influenza A/B     Follow-up: No follow-ups on file.  An After Visit Summary was printed and given to the patient.  [name], MD [facility] [phone]

## 2021-08-11 LAB — POCT INFLUENZA A/B
Influenza A, POC: NEGATIVE
Influenza B, POC: NEGATIVE

## 2021-08-11 NOTE — Assessment & Plan Note (Addendum)
Ordered rocephin 1 g once at the office. Sent cefdinir and azithomax (antibiotics) Rest! Drink lots of fluids! Eat 3 meals per day

## 2021-08-11 NOTE — Progress Notes (Signed)
Cholesterol: Huge improvement, but Ldl goal is less than 70. Recommend increase crestor to 40 mg daily.  HBA1C: Improved. Down to 6.3. Rest of labs essentially normal.  Keep follow up in 3 months fasting.

## 2021-08-12 ENCOUNTER — Emergency Department (HOSPITAL_COMMUNITY): Payer: Medicare HMO

## 2021-08-12 ENCOUNTER — Inpatient Hospital Stay (HOSPITAL_COMMUNITY)
Admission: EM | Admit: 2021-08-12 | Discharge: 2021-08-25 | DRG: 177 | Disposition: A | Discharge status: Home Health | Payer: Medicare HMO | Attending: Internal Medicine | Admitting: Internal Medicine

## 2021-08-12 ENCOUNTER — Encounter (HOSPITAL_COMMUNITY): Payer: Self-pay | Admitting: Emergency Medicine

## 2021-08-12 DIAGNOSIS — J159 Unspecified bacterial pneumonia: Secondary | ICD-10-CM | POA: Diagnosis not present

## 2021-08-12 DIAGNOSIS — Y92239 Unspecified place in hospital as the place of occurrence of the external cause: Secondary | ICD-10-CM | POA: Diagnosis not present

## 2021-08-12 DIAGNOSIS — J189 Pneumonia, unspecified organism: Secondary | ICD-10-CM | POA: Diagnosis present

## 2021-08-12 DIAGNOSIS — Z85828 Personal history of other malignant neoplasm of skin: Secondary | ICD-10-CM

## 2021-08-12 DIAGNOSIS — E1165 Type 2 diabetes mellitus with hyperglycemia: Secondary | ICD-10-CM | POA: Diagnosis not present

## 2021-08-12 DIAGNOSIS — I517 Cardiomegaly: Secondary | ICD-10-CM | POA: Diagnosis not present

## 2021-08-12 DIAGNOSIS — T380X5A Adverse effect of glucocorticoids and synthetic analogues, initial encounter: Secondary | ICD-10-CM | POA: Diagnosis not present

## 2021-08-12 DIAGNOSIS — Z96652 Presence of left artificial knee joint: Secondary | ICD-10-CM | POA: Diagnosis present

## 2021-08-12 DIAGNOSIS — I129 Hypertensive chronic kidney disease with stage 1 through stage 4 chronic kidney disease, or unspecified chronic kidney disease: Secondary | ICD-10-CM | POA: Diagnosis present

## 2021-08-12 DIAGNOSIS — K219 Gastro-esophageal reflux disease without esophagitis: Secondary | ICD-10-CM | POA: Diagnosis present

## 2021-08-12 DIAGNOSIS — Z79899 Other long term (current) drug therapy: Secondary | ICD-10-CM

## 2021-08-12 DIAGNOSIS — E1169 Type 2 diabetes mellitus with other specified complication: Secondary | ICD-10-CM | POA: Diagnosis not present

## 2021-08-12 DIAGNOSIS — Z0184 Encounter for antibody response examination: Secondary | ICD-10-CM | POA: Diagnosis not present

## 2021-08-12 DIAGNOSIS — J84112 Idiopathic pulmonary fibrosis: Secondary | ICD-10-CM | POA: Diagnosis not present

## 2021-08-12 DIAGNOSIS — R0602 Shortness of breath: Secondary | ICD-10-CM | POA: Diagnosis not present

## 2021-08-12 DIAGNOSIS — Z66 Do not resuscitate: Secondary | ICD-10-CM | POA: Diagnosis present

## 2021-08-12 DIAGNOSIS — J449 Chronic obstructive pulmonary disease, unspecified: Secondary | ICD-10-CM | POA: Diagnosis not present

## 2021-08-12 DIAGNOSIS — E782 Mixed hyperlipidemia: Secondary | ICD-10-CM | POA: Diagnosis present

## 2021-08-12 DIAGNOSIS — M317 Microscopic polyangiitis: Secondary | ICD-10-CM | POA: Diagnosis present

## 2021-08-12 DIAGNOSIS — D849 Immunodeficiency, unspecified: Secondary | ICD-10-CM | POA: Diagnosis present

## 2021-08-12 DIAGNOSIS — Z7984 Long term (current) use of oral hypoglycemic drugs: Secondary | ICD-10-CM

## 2021-08-12 DIAGNOSIS — J1282 Pneumonia due to coronavirus disease 2019: Secondary | ICD-10-CM | POA: Diagnosis present

## 2021-08-12 DIAGNOSIS — E1142 Type 2 diabetes mellitus with diabetic polyneuropathy: Secondary | ICD-10-CM | POA: Diagnosis not present

## 2021-08-12 DIAGNOSIS — U071 COVID-19: Principal | ICD-10-CM | POA: Diagnosis present

## 2021-08-12 DIAGNOSIS — Z7982 Long term (current) use of aspirin: Secondary | ICD-10-CM

## 2021-08-12 DIAGNOSIS — J988 Other specified respiratory disorders: Secondary | ICD-10-CM | POA: Diagnosis present

## 2021-08-12 DIAGNOSIS — N1831 Chronic kidney disease, stage 3a: Secondary | ICD-10-CM | POA: Diagnosis not present

## 2021-08-12 DIAGNOSIS — D649 Anemia, unspecified: Secondary | ICD-10-CM | POA: Diagnosis present

## 2021-08-12 DIAGNOSIS — Z885 Allergy status to narcotic agent status: Secondary | ICD-10-CM

## 2021-08-12 DIAGNOSIS — E871 Hypo-osmolality and hyponatremia: Secondary | ICD-10-CM | POA: Diagnosis not present

## 2021-08-12 DIAGNOSIS — Z86010 Personal history of colonic polyps: Secondary | ICD-10-CM

## 2021-08-12 DIAGNOSIS — I2510 Atherosclerotic heart disease of native coronary artery without angina pectoris: Secondary | ICD-10-CM | POA: Diagnosis not present

## 2021-08-12 DIAGNOSIS — Z87442 Personal history of urinary calculi: Secondary | ICD-10-CM | POA: Diagnosis not present

## 2021-08-12 DIAGNOSIS — Z832 Family history of diseases of the blood and blood-forming organs and certain disorders involving the immune mechanism: Secondary | ICD-10-CM

## 2021-08-12 DIAGNOSIS — R0609 Other forms of dyspnea: Secondary | ICD-10-CM | POA: Diagnosis not present

## 2021-08-12 DIAGNOSIS — J90 Pleural effusion, not elsewhere classified: Secondary | ICD-10-CM | POA: Diagnosis not present

## 2021-08-12 DIAGNOSIS — Z823 Family history of stroke: Secondary | ICD-10-CM

## 2021-08-12 DIAGNOSIS — E860 Dehydration: Secondary | ICD-10-CM | POA: Diagnosis present

## 2021-08-12 DIAGNOSIS — Z881 Allergy status to other antibiotic agents status: Secondary | ICD-10-CM

## 2021-08-12 DIAGNOSIS — Z831 Family history of other infectious and parasitic diseases: Secondary | ICD-10-CM

## 2021-08-12 DIAGNOSIS — Z794 Long term (current) use of insulin: Secondary | ICD-10-CM | POA: Diagnosis present

## 2021-08-12 DIAGNOSIS — E1122 Type 2 diabetes mellitus with diabetic chronic kidney disease: Secondary | ICD-10-CM | POA: Diagnosis present

## 2021-08-12 DIAGNOSIS — J9601 Acute respiratory failure with hypoxia: Secondary | ICD-10-CM | POA: Diagnosis not present

## 2021-08-12 DIAGNOSIS — Z8249 Family history of ischemic heart disease and other diseases of the circulatory system: Secondary | ICD-10-CM

## 2021-08-12 DIAGNOSIS — R7989 Other specified abnormal findings of blood chemistry: Secondary | ICD-10-CM | POA: Diagnosis present

## 2021-08-12 DIAGNOSIS — Z800 Family history of malignant neoplasm of digestive organs: Secondary | ICD-10-CM

## 2021-08-12 DIAGNOSIS — Z888 Allergy status to other drugs, medicaments and biological substances status: Secondary | ICD-10-CM | POA: Diagnosis not present

## 2021-08-12 DIAGNOSIS — R918 Other nonspecific abnormal finding of lung field: Secondary | ICD-10-CM | POA: Diagnosis not present

## 2021-08-12 HISTORY — DX: Pneumonia, unspecified organism: J18.9

## 2021-08-12 HISTORY — DX: COVID-19: U07.1

## 2021-08-12 HISTORY — DX: Type 2 diabetes mellitus with diabetic polyneuropathy: E11.42

## 2021-08-12 HISTORY — DX: Chronic kidney disease, stage 3a: N18.31

## 2021-08-12 HISTORY — DX: Microscopic polyangiitis: M31.7

## 2021-08-12 HISTORY — DX: Hypo-osmolality and hyponatremia: E87.1

## 2021-08-12 HISTORY — DX: Type 2 diabetes mellitus with other specified complication: E11.69

## 2021-08-12 HISTORY — DX: Other specified respiratory disorders: J98.8

## 2021-08-12 LAB — CBC WITH DIFFERENTIAL/PLATELET
Abs Immature Granulocytes: 0.04 10*3/uL (ref 0.00–0.07)
Basophils Absolute: 0 10*3/uL (ref 0.0–0.1)
Basophils Relative: 0 %
Eosinophils Absolute: 0 10*3/uL (ref 0.0–0.5)
Eosinophils Relative: 0 %
HCT: 44.2 % (ref 39.0–52.0)
Hemoglobin: 15.1 g/dL (ref 13.0–17.0)
Immature Granulocytes: 0 %
Lymphocytes Relative: 12 %
Lymphs Abs: 1.2 10*3/uL (ref 0.7–4.0)
MCH: 28.9 pg (ref 26.0–34.0)
MCHC: 34.2 g/dL (ref 30.0–36.0)
MCV: 84.7 fL (ref 80.0–100.0)
Monocytes Absolute: 1 10*3/uL (ref 0.1–1.0)
Monocytes Relative: 10 %
Neutro Abs: 7.5 10*3/uL (ref 1.7–7.7)
Neutrophils Relative %: 78 %
Platelets: 256 10*3/uL (ref 150–400)
RBC: 5.22 MIL/uL (ref 4.22–5.81)
RDW: 12.1 % (ref 11.5–15.5)
WBC: 9.7 10*3/uL (ref 4.0–10.5)
nRBC: 0 % (ref 0.0–0.2)

## 2021-08-12 LAB — URINALYSIS, ROUTINE W REFLEX MICROSCOPIC
Bilirubin Urine: NEGATIVE
Glucose, UA: NEGATIVE mg/dL
Ketones, ur: NEGATIVE mg/dL
Leukocytes,Ua: NEGATIVE
Nitrite: NEGATIVE
Protein, ur: 100 mg/dL — AB
Specific Gravity, Urine: 1.025 (ref 1.005–1.030)
pH: 6 (ref 5.0–8.0)

## 2021-08-12 LAB — COMPREHENSIVE METABOLIC PANEL
ALT: 22 U/L (ref 0–44)
AST: 34 U/L (ref 15–41)
Albumin: 3.1 g/dL — ABNORMAL LOW (ref 3.5–5.0)
Alkaline Phosphatase: 114 U/L (ref 38–126)
Anion gap: 11 (ref 5–15)
BUN: 14 mg/dL (ref 8–23)
CO2: 22 mmol/L (ref 22–32)
Calcium: 8.2 mg/dL — ABNORMAL LOW (ref 8.9–10.3)
Chloride: 94 mmol/L — ABNORMAL LOW (ref 98–111)
Creatinine, Ser: 1.35 mg/dL — ABNORMAL HIGH (ref 0.61–1.24)
GFR, Estimated: 52 mL/min — ABNORMAL LOW (ref >60)
Glucose, Bld: 144 mg/dL — ABNORMAL HIGH (ref 70–99)
Potassium: 4.1 mmol/L (ref 3.5–5.1)
Sodium: 127 mmol/L — ABNORMAL LOW (ref 135–145)
Total Bilirubin: 1 mg/dL (ref 0.3–1.2)
Total Protein: 6.1 g/dL — ABNORMAL LOW (ref 6.5–8.1)

## 2021-08-12 LAB — RESP PANEL BY RT-PCR (FLU A&B, COVID) ARPGX2
Influenza A by PCR: NEGATIVE
Influenza B by PCR: NEGATIVE
SARS Coronavirus 2 by RT PCR: POSITIVE — AB

## 2021-08-12 LAB — URINALYSIS, MICROSCOPIC (REFLEX): Bacteria, UA: NONE SEEN

## 2021-08-12 LAB — LACTIC ACID, PLASMA: Lactic Acid, Venous: 1.7 mmol/L (ref 0.5–1.9)

## 2021-08-12 LAB — PROCALCITONIN: Procalcitonin: 0.13 ng/mL

## 2021-08-12 LAB — C-REACTIVE PROTEIN: CRP: 12.4 mg/dL — ABNORMAL HIGH (ref <1.0)

## 2021-08-12 LAB — BRAIN NATRIURETIC PEPTIDE: B Natriuretic Peptide: 91.3 pg/mL (ref 0.0–100.0)

## 2021-08-12 MED ORDER — METHYLPREDNISOLONE SODIUM SUCC 125 MG IJ SOLR
80.0000 mg | INTRAMUSCULAR | Status: DC
  Administered 2021-08-12: 23:00:00 80 mg via INTRAVENOUS
  Filled 2021-08-12: qty 2

## 2021-08-12 MED ORDER — SODIUM CHLORIDE 0.9 % IV SOLN
500.0000 mg | INTRAVENOUS | Status: DC
  Administered 2021-08-13: 21:00:00 500 mg via INTRAVENOUS
  Filled 2021-08-12: qty 5

## 2021-08-12 MED ORDER — ROSUVASTATIN CALCIUM 20 MG PO TABS
20.0000 mg | ORAL_TABLET | Freq: Every day | ORAL | Status: DC
  Administered 2021-08-12 – 2021-08-24 (×13): 20 mg via ORAL
  Filled 2021-08-12 (×13): qty 1

## 2021-08-12 MED ORDER — INSULIN ASPART 100 UNIT/ML IJ SOLN
0.0000 [IU] | Freq: Three times a day (TID) | INTRAMUSCULAR | Status: DC
  Administered 2021-08-13 (×2): 8 [IU] via SUBCUTANEOUS
  Administered 2021-08-13: 13:00:00 3 [IU] via SUBCUTANEOUS
  Administered 2021-08-13: 22:00:00 8 [IU] via SUBCUTANEOUS
  Administered 2021-08-14: 21:00:00 11 [IU] via SUBCUTANEOUS
  Administered 2021-08-14: 08:00:00 3 [IU] via SUBCUTANEOUS
  Administered 2021-08-14: 17:00:00 11 [IU] via SUBCUTANEOUS
  Administered 2021-08-14: 12:00:00 15 [IU] via SUBCUTANEOUS
  Administered 2021-08-15 (×2): 8 [IU] via SUBCUTANEOUS
  Administered 2021-08-15: 09:00:00 5 [IU] via SUBCUTANEOUS
  Administered 2021-08-16: 17:00:00 15 [IU] via SUBCUTANEOUS
  Administered 2021-08-16: 09:00:00 11 [IU] via SUBCUTANEOUS
  Administered 2021-08-16: 13:00:00 8 [IU] via SUBCUTANEOUS
  Administered 2021-08-16 – 2021-08-17 (×2): 2 [IU] via SUBCUTANEOUS
  Administered 2021-08-17: 08:00:00 3 [IU] via SUBCUTANEOUS
  Administered 2021-08-17: 22:00:00 15 [IU] via SUBCUTANEOUS
  Administered 2021-08-17: 17:00:00 5 [IU] via SUBCUTANEOUS
  Administered 2021-08-18: 08:00:00 3 [IU] via SUBCUTANEOUS
  Administered 2021-08-18 (×2): 5 [IU] via SUBCUTANEOUS
  Administered 2021-08-18 – 2021-08-19 (×2): 3 [IU] via SUBCUTANEOUS
  Administered 2021-08-19: 16:00:00 11 [IU] via SUBCUTANEOUS
  Administered 2021-08-19: 13:00:00 5 [IU] via SUBCUTANEOUS
  Administered 2021-08-19: 23:00:00 11 [IU] via SUBCUTANEOUS
  Administered 2021-08-20: 10:00:00 3 [IU] via SUBCUTANEOUS
  Administered 2021-08-20: 21:00:00 5 [IU] via SUBCUTANEOUS
  Administered 2021-08-20 (×2): 3 [IU] via SUBCUTANEOUS
  Administered 2021-08-21 (×2): 5 [IU] via SUBCUTANEOUS
  Administered 2021-08-21: 13:00:00 2 [IU] via SUBCUTANEOUS
  Administered 2021-08-22: 22:00:00 11 [IU] via SUBCUTANEOUS
  Administered 2021-08-22: 13:00:00 8 [IU] via SUBCUTANEOUS
  Administered 2021-08-22 – 2021-08-23 (×2): 2 [IU] via SUBCUTANEOUS
  Administered 2021-08-23: 17:00:00 11 [IU] via SUBCUTANEOUS
  Administered 2021-08-23: 22:00:00 3 [IU] via SUBCUTANEOUS
  Administered 2021-08-24: 2 [IU] via SUBCUTANEOUS
  Administered 2021-08-24: 8 [IU] via SUBCUTANEOUS
  Administered 2021-08-24: 5 [IU] via SUBCUTANEOUS
  Administered 2021-08-24 – 2021-08-25 (×2): 3 [IU] via SUBCUTANEOUS

## 2021-08-12 MED ORDER — GABAPENTIN 100 MG PO CAPS
400.0000 mg | ORAL_CAPSULE | Freq: Two times a day (BID) | ORAL | Status: DC
  Administered 2021-08-12 – 2021-08-25 (×26): 400 mg via ORAL
  Filled 2021-08-12 (×27): qty 4

## 2021-08-12 MED ORDER — ZINC SULFATE 220 (50 ZN) MG PO CAPS
220.0000 mg | ORAL_CAPSULE | Freq: Every day | ORAL | Status: DC
  Administered 2021-08-13 – 2021-08-25 (×13): 220 mg via ORAL
  Filled 2021-08-12 (×13): qty 1

## 2021-08-12 MED ORDER — ACETAMINOPHEN 650 MG RE SUPP: 650.0000 mg | Freq: Four times a day (QID) | RECTAL | Status: DC | PRN

## 2021-08-12 MED ORDER — POLYETHYLENE GLYCOL 3350 17 G PO PACK
17.0000 g | PACK | Freq: Every day | ORAL | Status: DC | PRN
  Administered 2021-08-18: 08:00:00 17 g via ORAL
  Filled 2021-08-12: qty 1

## 2021-08-12 MED ORDER — GUAIFENESIN-DM 100-10 MG/5ML PO SYRP
10.0000 mL | ORAL_SOLUTION | ORAL | Status: DC | PRN
  Administered 2021-08-15 – 2021-08-16 (×3): 10 mL via ORAL
  Filled 2021-08-12 (×3): qty 10

## 2021-08-12 MED ORDER — ONDANSETRON HCL 4 MG PO TABS
4.0000 mg | ORAL_TABLET | Freq: Four times a day (QID) | ORAL | Status: DC | PRN
  Filled 2021-08-12: qty 1

## 2021-08-12 MED ORDER — ALBUTEROL SULFATE HFA 108 (90 BASE) MCG/ACT IN AERS
2.0000 | INHALATION_SPRAY | Freq: Four times a day (QID) | RESPIRATORY_TRACT | Status: DC
  Administered 2021-08-12 – 2021-08-16 (×13): 2 via RESPIRATORY_TRACT
  Filled 2021-08-12 (×2): qty 6.7

## 2021-08-12 MED ORDER — ENOXAPARIN SODIUM 40 MG/0.4ML IJ SOSY
40.0000 mg | PREFILLED_SYRINGE | INTRAMUSCULAR | Status: DC
  Administered 2021-08-13 – 2021-08-17 (×5): 40 mg via SUBCUTANEOUS
  Filled 2021-08-12 (×5): qty 0.4

## 2021-08-12 MED ORDER — VANCOMYCIN HCL 2000 MG/400ML IV SOLN
2000.0000 mg | Freq: Once | INTRAVENOUS | Status: DC
  Filled 2021-08-12: qty 400

## 2021-08-12 MED ORDER — ASCORBIC ACID 500 MG PO TABS
500.0000 mg | ORAL_TABLET | Freq: Every day | ORAL | Status: DC
  Administered 2021-08-13 – 2021-08-25 (×13): 500 mg via ORAL
  Filled 2021-08-12 (×14): qty 1

## 2021-08-12 MED ORDER — PANTOPRAZOLE SODIUM 40 MG PO TBEC
40.0000 mg | DELAYED_RELEASE_TABLET | Freq: Every day | ORAL | Status: DC
  Administered 2021-08-13 – 2021-08-17 (×5): 40 mg via ORAL
  Filled 2021-08-12 (×5): qty 1

## 2021-08-12 MED ORDER — VANCOMYCIN HCL 1750 MG/350ML IV SOLN
1750.0000 mg | Freq: Once | INTRAVENOUS | Status: AC
  Administered 2021-08-13: 01:00:00 1750 mg via INTRAVENOUS
  Filled 2021-08-12: qty 350

## 2021-08-12 MED ORDER — SODIUM CHLORIDE 0.9 % IV SOLN
2.0000 g | Freq: Two times a day (BID) | INTRAVENOUS | Status: AC
  Administered 2021-08-12 – 2021-08-18 (×13): 2 g via INTRAVENOUS
  Filled 2021-08-12 (×13): qty 2

## 2021-08-12 MED ORDER — VANCOMYCIN HCL 1250 MG/250ML IV SOLN
1250.0000 mg | INTRAVENOUS | Status: DC
  Administered 2021-08-13: 23:00:00 1250 mg via INTRAVENOUS
  Filled 2021-08-12: qty 250

## 2021-08-12 MED ORDER — NITROGLYCERIN 0.4 MG SL SUBL: 0.4000 mg | SUBLINGUAL_TABLET | SUBLINGUAL | Status: DC | PRN

## 2021-08-12 MED ORDER — LEVOFLOXACIN IN D5W 750 MG/150ML IV SOLN
750.0000 mg | Freq: Once | INTRAVENOUS | Status: AC
  Administered 2021-08-12: 20:00:00 750 mg via INTRAVENOUS
  Filled 2021-08-12: qty 150

## 2021-08-12 MED ORDER — VANCOMYCIN HCL 1250 MG/250ML IV SOLN: 1250.0000 mg | INTRAVENOUS | Status: DC

## 2021-08-12 MED ORDER — ASPIRIN EC 81 MG PO TBEC
81.0000 mg | DELAYED_RELEASE_TABLET | Freq: Every day | ORAL | Status: DC
  Administered 2021-08-13 – 2021-08-25 (×13): 81 mg via ORAL
  Filled 2021-08-12 (×13): qty 1

## 2021-08-12 MED ORDER — IOHEXOL 350 MG/ML SOLN
80.0000 mL | Freq: Once | INTRAVENOUS | Status: AC | PRN
  Administered 2021-08-12: 18:00:00 80 mL via INTRAVENOUS

## 2021-08-12 MED ORDER — ACETAMINOPHEN 325 MG PO TABS
650.0000 mg | ORAL_TABLET | Freq: Four times a day (QID) | ORAL | Status: DC | PRN
  Administered 2021-08-15 – 2021-08-20 (×4): 650 mg via ORAL
  Filled 2021-08-12 (×3): qty 2

## 2021-08-12 MED ORDER — SODIUM CHLORIDE 0.9 % IV SOLN: INTRAVENOUS | Status: AC

## 2021-08-12 MED ORDER — SODIUM CHLORIDE 0.9 % IV BOLUS
1000.0000 mL | Freq: Once | INTRAVENOUS | Status: AC
  Administered 2021-08-12: 17:00:00 1000 mL via INTRAVENOUS

## 2021-08-12 MED ORDER — PREDNISONE 20 MG PO TABS: 60.0000 mg | ORAL_TABLET | Freq: Every day | ORAL | Status: DC

## 2021-08-12 MED ORDER — ONDANSETRON HCL 4 MG/2ML IJ SOLN: 4.0000 mg | Freq: Four times a day (QID) | INTRAMUSCULAR | Status: DC | PRN

## 2021-08-12 NOTE — ED Notes (Signed)
Saturations dropping to 87-89% on RA while sleeping, 2 liter oxygen applied

## 2021-08-12 NOTE — Assessment & Plan Note (Signed)
•   Patient been placed on Accu-Cheks before every meal and nightly with sliding scale insulin  Hyperglycemia expected due to initiation of systemic steroids  Hemoglobin A1C ordered  Diabetic Diet

## 2021-08-12 NOTE — Assessment & Plan Note (Signed)
·   Modest hyponatremia likely secondary to volume depletion  Gentle intravenous hydration with isotonic fluids  Monitoring sodium levels with serial chemistries

## 2021-08-12 NOTE — Assessment & Plan Note (Signed)
·   Please see assessment and plan above °

## 2021-08-12 NOTE — Assessment & Plan Note (Signed)
·   Continue home regimen of gabapentin

## 2021-08-12 NOTE — Assessment & Plan Note (Signed)
·   Has followed with Dr. Vaughan Browner with pulmonology in the outpatient setting   Also follows with outpatient rheumatology whom he receives rituximab infusions with   Due to immunocompromised state patient is being treated with broad-spectrum antibiotics  Low threshold for consulting pulmonology during this hospitalization  Remainder of assessment and plan as above

## 2021-08-12 NOTE — Assessment & Plan Note (Addendum)
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  Considering patient's immunocompromised state persisting COVID infection is also a possibility  Obtaining blood cultures, CRP, procalcitonin  Until work-up is complete patient should be treated with broad-spectrum intravenous antibacterials including vancomycin, cefepime and azithromycin  Additionally providing patient with Solu-Medrol 0.5 mg/kg IV every 12hrs  Patient is already completed a course of Molnupirivir and is 14 days out from diagnosis, I do not believe that initiation of antiviral therapy for COVID is necessary at this time  If procalcitonin is normal will consider rapid de-escalation of antibacterial therapy  Based on clinical response overnight obtaining a pulmonology consultation will be considered in the morning  Supplemental oxygen for bouts of hypoxia  As needed bronchodilator therapy for shortness of breath and wheezing  Zinc and vitamin C supplementation  Airborne and contact isolation until 21 days out from initial COVID diagnosis considering continued symptoms

## 2021-08-12 NOTE — ED Provider Notes (Signed)
Emergency Medicine Provider Triage Evaluation Note  Kerry Matthews , a 85 y.o. male  was evaluated in triage.  Pt complains of worsening sx secondary to known pneumonia. Patient diagnosed covid 2 weeks ago, was seen and diagnosed with post-covid PNA at PCP on Friday. Has been taking cefdinir, azithromycin. Is supposed to get an infusion for polyangitis / UC at rheumatologist but is behind 2/2 recent illness. Worsening SOB with exertion, as well as nausea and vomiting this morning. Denies chest pain. Endorses some diarrhea. No hematemesis, no hematochezia. Fever 102 at home this morning.  Review of Systems  Positive: As above Negative: As above  Physical Exam  BP (!) 146/67 (BP Location: Right Arm)    Pulse 85    Temp 99.8 F (37.7 C) (Oral)    Resp 17    SpO2 93%  Gen:   Awake, no distress   Resp:  Normal effort  MSK:   Moves extremities without difficulty  Other:  Breath sounds clear to auscultation bilaterally, abdomen non-tender to palpation  Medical Decision Making  Medically screening exam initiated at 11:06 AM.  Appropriate orders placed.  [name] was informed that the remainder of the evaluation will be completed by another provider, this initial triage assessment does not replace that evaluation, and the importance of remaining in the ED until their evaluation is complete.  PNA complications Faythe Ghee 08/12/21 1109    Regan Lemming, MD 08/12/21 1215

## 2021-08-12 NOTE — Assessment & Plan Note (Signed)
.   Continuing home regimen of lipid lowering therapy.  

## 2021-08-12 NOTE — Assessment & Plan Note (Signed)
Continuing home regimen of daily PPI therapy.  

## 2021-08-12 NOTE — ED Provider Notes (Signed)
[facility] EMERGENCY DEPARTMENT Provider Note  CSN: [id number] Arrival date & time: 08/12/21 1016  Chief Complaint(s) Pneumonia  HPI Kerry Matthews is a 85 y.o. male with extensive past medical history listed below who recently had COVID, tested +2 weeks ago treated with molnupiravir (reported initial improvement, but began feeling ill again about a week ago), currently being treated for post COVID-pneumonia.  Patient on a cephalosporin and azithromycin that was started 3 days ago.  Patient has been compliant with medications.  Reports that he is not improving and is actually worsening.  He presents for worsening dyspnea on exertion, persistent fevers, and an episode of vomiting while in route to the ED. shortness of breath is worse with any activity even position changes.  Patient has not required supplemental oxygen at home.  They had a portable pulse ox and noted that his oxygen dropped down to the 70s while ambulating.  Patient is extremely tachypneic and short of breath with ambulation and has severe coughing spells.  He denies any associated chest pain.  No abdominal pain.  The nausea vomiting was while riding in a car.  Only once.  Has not recurred.     Pneumonia   Past Medical History 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  ANCA antibodies- myeloperoxidase ABS-greater than 100, ANCA proteinase 3-less than 3.5 04/02/2019- ANCA titers- p-ANCA +1: 160, C ANCA-less than 1: 20, atypical p-ANCA titer-less than 1: 20   Abnormal CT of the chest    Abnormal findings on diagnostic imaging of lung 04/12/2019   04/01/2019-CT chest with contrast- no acute process in chest abdomen or pelvis, interstitial lung disease suspicious for early or mild UIP, pulmonary artery enlargement suggest PAH    Acute low back pain without sciatica 03/08/2020   Acute low back pain without sciatica 03/08/2020   Acute lower UTI 07/07/2020   Acute pain  of left knee 11/30/2019   AKI (acute kidney injury) ([place])    Alpha-1-antitrypsin deficiency carrier 05/04/2019   Anemia    Atherosclerotic heart disease of native coronary artery without angina pectoris 05/03/2018   Body mass index (BMI) 29.0-29.9, adult 01/05/2020   Bradycardia    Burning sensation of feet 01/05/2020   CKD (chronic kidney disease) 05/03/2018   Community acquired pneumonia    Community acquired pneumonia    Coronary artery disease    Cough    Diabetes ([place])    Diabetes mellitus type 2 in obese ([place]) 04/01/2019   Dyslipidemia associated with type 2 diabetes mellitus ([place]) 05/03/2018   Dysuria 09/07/2020   Elevated LFTs 04/01/2019   Elevated rheumatoid factor 04/12/2019   04/03/2019-rheumatoid factor-95.4   Emphysema (subcutaneous) (surgical) resulting from a procedure 05/03/2018   Essential hypertension 05/03/2018   Fatigue 03/29/2021   Fever    GAD (generalized anxiety disorder)    GERD (gastroesophageal reflux disease)    Hematuria 09/07/2020   History of alpha-1-antitrypsin deficiency 04/12/2019   History of kidney stones    Hyperlipidemia    Hypoxemia    Idiopathic pulmonary fibrosis ([place]) 03/29/2021   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) 04/12/2019   04/01/2019-CT chest with contrast- no acute process in chest abdomen or pelvis, interstitial lung disease suspicious for early or mild UIP, pulmonary artery enlargement suggest PAH  04/02/2019-connective tissue work-up: Anti-Jo 1-negative Anti-DNA antibody double-stranded-negative Anti-scleroderma antibody-negative Sjogren's syndrome antibody-negative Sjogren's syndrome antibody-negative CK-31 CCP-6 E   Leukocytosis  03/08/2020   Low vitamin B12 level 04/03/2019   Lower extremity edema    Medicare annual wellness visit, subsequent 08/09/2020   Microscopic polyangiitis ([place]) 03/29/2021   Mixed hyperlipidemia 05/03/2018   Myalgia 04/19/2019    Osteoarthritis    Other emphysema ([place])    Other long term (current) drug therapy 03/29/2021   Paresthesias in left hand 11/30/2019   Peripheral polyneuropathy 01/30/2020   Pneumonia 04/01/2019   Pneumonitis    Pneumonitis    Polymyositis ([place])    Primary insomnia    Proteinuria 09/07/2020   Renal insufficiency 09/06/2020   Renal stones    Rheumatoid factor positive 03/29/2021   Sepsis ([place]) 04/19/2019   Severe sepsis ([place]) 07/07/2020   Skin cancer    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/17/2020   Transaminitis    Trigger finger 05/03/2018   UC (ulcerative colitis) ([place])    Urinary urgency 09/07/2020   UTI (urinary tract infection)    Vasculitis ([place]) 03/29/2021   Patient Active Problem List   Diagnosis Date Noted   Pneumonia of both lungs due to infectious organism 08/12/2021   Respiratory disorder concurrent with and due to microscopic polyangiitis ([place]) 08/12/2021   Hyponatremia 08/12/2021   Mixed diabetic hyperlipidemia associated with type 2 diabetes mellitus ([place]) 08/12/2021   Diabetic polyneuropathy associated with type 2 diabetes mellitus ([place]) 08/12/2021   Chronic kidney disease, stage 3a (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   Microscopic polyangiitis ([place]) 03/29/2021   Rheumatoid factor positive 03/29/2021   Vasculitis ([place]) 03/29/2021   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/17/2020   Hematuria 09/07/2020   Proteinuria 09/07/2020   Renal insufficiency 09/06/2020   Coronary artery disease    GAD (generalized anxiety disorder)    Osteoarthritis    Other emphysema ([place])    Primary insomnia    Skin cancer    UC (ulcerative colitis) ([place])    UTI (urinary tract infection)    Medicare annual wellness visit, subsequent 08/09/2020   Leukocytosis  03/08/2020   Peripheral polyneuropathy 01/30/2020   Body mass index (BMI) 29.0-29.9, adult 01/05/2020   Acute pain of left knee 11/30/2019   Alpha-1-antitrypsin deficiency carrier 05/04/2019   Sepsis ([place]) 04/19/2019   Elevated rheumatoid factor 04/12/2019   Interstitial pulmonary disease ([place]) 04/12/2019   Abnormal ANCA test 04/12/2019   Abnormal findings on diagnostic imaging of lung 04/12/2019   Polymyositis ([place])    Low vitamin B12 level 04/03/2019   Abnormal CT of the chest    Hypertensive heart disease without heart failure 05/03/2018   Mixed hyperlipidemia 05/03/2018   Coronary artery disease involving native coronary artery of native heart without angina pectoris 05/03/2018   GERD without esophagitis 05/03/2018   Trigger finger 05/03/2018   Bradycardia 05/03/2018   CKD (chronic kidney disease) 05/03/2018   Insomnia 05/03/2018   Home Medication(s) Prior to Admission medications   Medication Sig Start Date End Date Taking? Authorizing Provider  aspirin EC 81 MG tablet Take 1 tablet (81 mg total) by mouth 2 (two) times daily. To be taken after surgery Patient taking differently: Take 81 mg by mouth in the morning. 11/07/20  Yes Dwana Melena L, PA-C  azithromycin (ZITHROMAX) 250 MG tablet 2 DAILY FOR FIRST DAY, THEN DECREASE TO ONE DAILY FOR 4 MORE DAYS. Patient taking differently: Take  250-500 mg by mouth See admin instructions. Starting on 08/09/2021, take 500 mg by mouth on day one, then decrease to 250 mg once a day for 4 days 08/09/21  Yes Cox, Kirsten, MD  cefdinir (OMNICEF) 300 MG capsule Take 1 capsule (300 mg total) by mouth 2 (two) times daily. 08/09/21  Yes Cox, Kirsten, MD  Coenzyme Q10 100 MG TABS Take 100 mg by mouth at bedtime.   Yes [provider]  furosemide (LASIX) 20 MG tablet Take 20 mg by mouth daily as needed for fluid.   Yes [provider]  gabapentin (NEURONTIN) 400 MG capsule Take 400 mg by mouth 2 (two) times daily.   Yes [provider]  Multiple Vitamins-Minerals (PRESERVISION AREDS 2) CAPS Take 1 capsule by mouth 2 (two) times daily after a meal.   Yes [provider]  nitroGLYCERIN (NITROSTAT) 0.4 MG SL tablet Place 1 tablet (0.4 mg total) under the tongue every 5 (five) minutes as needed for chest pain. 09/06/20  Yes Revankar, Reita Cliche, MD  Omega-3 Fatty Acids (FISH OIL) 1000 MG CAPS Take 2,000 mg by mouth daily.   Yes [provider]  omeprazole (PRILOSEC) 20 MG capsule Take 1 capsule (20 mg total) by mouth daily. Patient taking differently: Take 20 mg by mouth daily before breakfast. 04/23/21  Yes Cox, Kirsten, MD  riTUXimab (RITUXAN) 100 MG/10ML injection Inject 100 mg into the vein every 6 (six) months.   Yes [provider]  rosuvastatin (CRESTOR) 20 MG tablet Take 1 tablet (20 mg total) by mouth daily. Patient taking differently: Take 20 mg by mouth at bedtime. 04/23/21  Yes Cox, Kirsten, MD  vitamin B-12 (CYANOCOBALAMIN) 1000 MCG tablet Take 1 tablet (1,000 mcg total) by mouth daily. Patient taking differently: Take 1,000 mcg by mouth daily with breakfast. 04/06/19  Yes Vann, Jessica U, DO  docusate sodium (COLACE) 100 MG capsule Take 100 mg by mouth 2 (two) times daily as needed for mild constipation. Patient not taking: Reported on 08/12/2021    [provider]  glucose blood (TRUE METRIX BLOOD GLUCOSE TEST) test strip E11.69 TEST BLOOD SUGAR THREE TIMES DAILY BEFORE MEALS 10/03/20   [name], MD                                                                                                                                    Past Surgical History Past Surgical History:  Procedure Laterality Date   ANGIOPLASTY  2010   BACK SURGERY     CATARACT EXTRACTION     COLONOSCOPY  06/16/2005   Mild colitis involving splenic flexure. Colonic polyps, status post polypectomy. Mild pancolonic diverticulitits. Internal hemorrhoids.    ESOPHAGOGASTRODUODENOSCOPY  04/26/2003    Irregular Z line suggestive of GERD. Mild gastritis status post CLO testing.    TOTAL KNEE ARTHROPLASTY Left 12/17/2020   Procedure: LEFT TOTAL KNEE ARTHROPLASTY;  Surgeon: Leandrew Koyanagi, MD;  Location: [facility];  Service: Orthopedics;  Laterality: Left;   TRIGGER FINGER RELEASE     Family History Family History  Problem Relation Age of Onset   Tuberculosis Mother    Stroke Father    Pancreatic cancer Sister    Heart attack Sister    Lung disease Sister    Clotting disorder Brother    Colon cancer Neg Hx    Esophageal cancer Neg Hx     Social History Social History   Tobacco Use   Smoking status: Never   Smokeless tobacco: Never  Vaping Use   Vaping Use: Never used  Substance Use Topics   Alcohol use: Never   Drug use: Never   Allergies Ace inhibitors, Hydrocodone, Hydrocodone-acetaminophen, Nsaids, Sulfamethoxazole, Sulfamethoxazole-trimethoprim, and Trimethoprim  Review of Systems Review of Systems All other systems are reviewed and are negative for acute change except as noted in the HPI  Physical Exam Vital Signs  I have reviewed the triage vital signs BP (!) 143/69    Pulse 79    Temp 99.8 F (37.7 C) (Oral)    Resp (!) 25    SpO2 91%   Physical Exam Vitals reviewed.  Constitutional:      General: He is not in acute distress.    Appearance: He is well-developed. He is not diaphoretic.  HENT:     Head: Normocephalic and atraumatic.     Nose: Nose normal.  Eyes:     General: No scleral icterus.       Right eye: No discharge.        Left eye: No discharge.     Conjunctiva/sclera: Conjunctivae normal.     Pupils: Pupils are equal, round, and reactive to light.  Cardiovascular:     Rate and Rhythm: Normal rate. Rhythm regularly irregular.     Heart sounds: No murmur heard.   No friction rub. No gallop.  Pulmonary:     Effort: Pulmonary effort is normal. Tachypnea present. No respiratory distress.     Breath sounds: Normal breath sounds. No stridor. No rales  (fine rales in middle and lower fields.).     Comments: Increased WOB while sitting up during exam. O2% down from 99% to 92% with this. Abdominal:     General: There is no distension.     Palpations: Abdomen is soft.     Tenderness: There is no abdominal tenderness.  Musculoskeletal:        General: No tenderness.     Cervical back: Normal range of motion and neck supple.  Skin:    General: Skin is warm and dry.     Findings: No erythema or rash.  Neurological:     Mental Status: He is alert and oriented to person, place, and time.    ED Results and Treatments Labs (all labs ordered are listed, but only abnormal results are displayed) Labs Reviewed  RESP PANEL BY RT-PCR (FLU A&B, COVID) ARPGX2 - Abnormal; Notable for the following components:      Result Value   SARS Coronavirus 2 by RT PCR POSITIVE (*)    All other components within normal limits  COMPREHENSIVE METABOLIC PANEL - Abnormal; Notable for the following components:   Sodium 127 (*)    Chloride 94 (*)    Glucose, Bld 144 (*)    Creatinine, Ser 1.35 (*)    Calcium 8.2 (*)    Total Protein 6.1 (*)    Albumin 3.1 (*)    GFR, Estimated 52 (*)  All other components within normal limits  URINALYSIS, ROUTINE W REFLEX MICROSCOPIC - Abnormal; Notable for the following components:   Hgb urine dipstick TRACE (*)    Protein, ur 100 (*)    All other components within normal limits  C-REACTIVE PROTEIN - Abnormal; Notable for the following components:   CRP 12.4 (*)    All other components within normal limits  CULTURE, BLOOD (ROUTINE X 2)  CULTURE, BLOOD (ROUTINE X 2)  RESPIRATORY PANEL BY PCR  MRSA NEXT GEN BY PCR, NASAL  LACTIC ACID, PLASMA  CBC WITH DIFFERENTIAL/PLATELET  BRAIN NATRIURETIC PEPTIDE  URINALYSIS, MICROSCOPIC (REFLEX)  PROCALCITONIN  LACTIC ACID, PLASMA  COMPREHENSIVE METABOLIC PANEL  MAGNESIUM  CBC WITH DIFFERENTIAL/PLATELET  C-REACTIVE PROTEIN  D-DIMER, QUANTITATIVE                                                                                                                          EKG  EKG Interpretation  Date/Time:  Monday August 12 2021 16:26:50 EST Ventricular Rate:  73 PR Interval:  185 QRS Duration: 98 QT Interval:  407 QTC Calculation: 449 R Axis:   -60 Text Interpretation: Sinus rhythm Ventricular premature complex Left anterior fascicular block Abnormal R-wave progression, late transition Confirmed by Addison Lank [phone]) on 08/12/2021 7:01:11 PM       Radiology DG Chest 2 View  Result Date: 08/12/2021 CLINICAL DATA:  Shortness of breath EXAM: CHEST - 2 VIEW COMPARISON:  Chest x-ray 08/09/2021 FINDINGS: Heart size is upper normal. Mediastinum appears stable. Persistent diffuse chronic prominent reticular interstitial opacities with patchy hazy superimposed opacities bilaterally. No significant pleural effusion. No pneumothorax. IMPRESSION: No significant change since previous study. COPD with patchy hazy opacities which could represent superimposed infiltrate. Electronically Signed   By: Ofilia Neas M.D.   On: 08/12/2021 11:34   CT Angio Chest PE W and/or Wo Contrast  Result Date: 08/12/2021 CLINICAL DATA:  Shortness of breath. EXAM: CT ANGIOGRAPHY CHEST WITH CONTRAST TECHNIQUE: Multidetector CT imaging of the chest was performed using the standard protocol during bolus administration of intravenous contrast. Multiplanar CT image reconstructions and MIPs were obtained to evaluate the vascular anatomy. CONTRAST:  96mL OMNIPAQUE IOHEXOL 350 MG/ML SOLN COMPARISON:  October 14, 2019. FINDINGS: Cardiovascular: Satisfactory opacification of the pulmonary arteries to the segmental level. No evidence of pulmonary embolism. Mild cardiomegaly. No pericardial effusion. Atherosclerosis of thoracic aorta is noted without dissection. Mild coronary artery calcifications are noted. Mediastinum/Nodes: No enlarged mediastinal, hilar, or axillary lymph nodes. Thyroid gland,  trachea, and esophagus demonstrate no significant findings. Lungs/Pleura: No pneumothorax or pleural effusion is noted. There is interval development of multiple patchy airspace opacities in both lungs concerning for multifocal pneumonia. Upper Abdomen: No acute abnormality. Musculoskeletal: No chest wall abnormality. No acute or significant osseous findings. Review of the MIP images confirms the above findings. IMPRESSION: No definite evidence of pulmonary embolus. Interval development of multiple patchy airspace opacities in both lungs concerning for multifocal pneumonia. Mild coronary calcifications are  noted. Aortic Atherosclerosis (ICD10-I70.0). Electronically Signed   By: Marijo Conception M.D.   On: 08/12/2021 18:30    Pertinent labs & imaging results that were available during my care of the patient were reviewed by me and considered in my medical decision making (see MDM for details).  Medications Ordered in ED Medications  aspirin EC tablet 81 mg (has no administration in time range)  nitroGLYCERIN (NITROSTAT) SL tablet 0.4 mg (has no administration in time range)  rosuvastatin (CRESTOR) tablet 20 mg (20 mg Oral Given 08/12/21 2301)  pantoprazole (PROTONIX) EC tablet 40 mg (has no administration in time range)  gabapentin (NEURONTIN) capsule 400 mg (400 mg Oral Given 08/12/21 2301)  enoxaparin (LOVENOX) injection 40 mg (has no administration in time range)  acetaminophen (TYLENOL) tablet 650 mg (has no administration in time range)    Or  acetaminophen (TYLENOL) suppository 650 mg (has no administration in time range)  polyethylene glycol (MIRALAX / GLYCOLAX) packet 17 g (has no administration in time range)  ondansetron (ZOFRAN) tablet 4 mg (has no administration in time range)    Or  ondansetron (ZOFRAN) injection 4 mg (has no administration in time range)  insulin aspart (novoLOG) injection 0-15 Units (has no administration in time range)  albuterol (VENTOLIN HFA) 108 (90 Base) MCG/ACT  inhaler 2 puff (2 puffs Inhalation Given 08/12/21 2302)  ascorbic acid (VITAMIN C) tablet 500 mg (has no administration in time range)  zinc sulfate capsule 220 mg (has no administration in time range)  guaiFENesin-dextromethorphan (ROBITUSSIN DM) 100-10 MG/5ML syrup 10 mL (has no administration in time range)  azithromycin (ZITHROMAX) 500 mg in sodium chloride 0.9 % 250 mL IVPB (has no administration in time range)  methylPREDNISolone sodium succinate (SOLU-MEDROL) 125 mg/2 mL injection 80 mg (80 mg Intravenous Given 08/12/21 2305)  0.9 %  sodium chloride infusion ( Intravenous New Bag/Given 08/12/21 2303)  ceFEPIme (MAXIPIME) 2 g in sodium chloride 0.9 % 100 mL IVPB (2 g Intravenous New Bag/Given 08/12/21 2304)  vancomycin (VANCOREADY) IVPB 1750 mg/350 mL (has no administration in time range)    Followed by  vancomycin (VANCOREADY) IVPB 1250 mg/250 mL (has no administration in time range)  sodium chloride 0.9 % bolus 1,000 mL (0 mLs Intravenous Stopped 08/12/21 1824)  iohexol (OMNIPAQUE) 350 MG/ML injection 80 mL (80 mLs Intravenous Contrast Given 08/12/21 1804)  levofloxacin (LEVAQUIN) IVPB 750 mg (0 mg Intravenous Stopped 08/12/21 2140)                                                                                                                                     Procedures Procedures  (including critical care time)  Medical Decision Making / ED Course I have reviewed the nursing notes for this encounter and the patient's prior records (if available in EHR or on provided paperwork).  [place] was evaluated in Emergency Department on 08/12/2021 for the symptoms described in the history  of present illness. He was evaluated in the context of the global COVID-19 pandemic, which necessitated consideration that the patient might be at risk for infection with the SARS-CoV-2 virus that causes COVID-19. Institutional protocols and algorithms that pertain to the evaluation of patients at  risk for COVID-19 are in a state of rapid change based on information released by regulatory bodies including the CDC and federal and state organizations. These policies and algorithms were followed during the patient's care in the ED.     Dyspnea on exertion. Recent COVID infection.  Currently being treated for postviral pneumonia.  On his Ethril and cephalosporin. Lungs with bilateral fine rales. Satting well on room air but increased work of breathing and oxygen saturation dropped while sitting up. Seen in MSE. CBC without leukocytosis or anemia. CMP notable for mild renal insufficiency Chest x-ray notable for possible infiltrates. Will expand work-up to to rule out PE and better characterize lung markings with CT scan.  We will also get BMP to rule out heart failure. CT PE negative for pulmonary emboli.  Did reveal evidence of multifocal pneumonia.  Patient started on IV antibiotics, Levaquin. Admitted to medicine for further work-up and management. BNP negative ruling out heart failure.  Pertinent labs & imaging results that were available during my care of the patient were reviewed by me and considered in my medical decision making:    Final Clinical Impression(s) / ED Diagnoses Final diagnoses:  Multifocal pneumonia     This chart was dictated using voice recognition software.  Despite best efforts to proofread,  errors can occur which can change the documentation meaning.    Fatima Blank, MD 08/12/21 2352

## 2021-08-12 NOTE — Progress Notes (Signed)
Pharmacy Antibiotic Note  Kerry Matthews is a 85 y.o. male admitted on 08/12/2021 with pneumonia.  Pharmacy has been consulted for cefepime and vancomycin dosing.  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 Patient presenting with SOB and dyspnea.  SCr 1.35 - near baseline WBC 9.7; LA 1.7; T 98.3 F  Plan: Azithromycin per MD Cefepime 2g q12hr Vancomycin 1750 mg once then 1250 mg q24hr (eAUC 537.5) unless change in renal function Trend WBC, Fever, Renal function, & Clinical course F/u cultures, clinical course, WBC, fever, MRSA PCR De-escalate when able Levels at steady state    Temp (24hrs), Avg:99.3 F (37.4 C), Min:98.3 F (36.8 C), Max:99.8 F (37.7 C)  Recent Labs  Lab 08/07/21 1031 08/12/21 1110 08/12/21 1627  WBC 7.4 9.7  --   CREATININE 1.23 1.35*  --   LATICACIDVEN  --   --  1.7    Estimated Creatinine Clearance: 44.2 mL/min (A) (by C-G formula based on SCr of 1.35 mg/dL (H)).    Allergies  Allergen Reactions   Ace Inhibitors Other (See Comments)    = Slow heart rate   Hydrocodone Nausea And Vomiting   Hydrocodone-Acetaminophen Nausea And Vomiting   Nsaids Other (See Comments)    Kidney issues   Sulfamethoxazole Nausea And Vomiting   Sulfamethoxazole-Trimethoprim Nausea And Vomiting   Trimethoprim Other (See Comments)    Reaction not recalled    Antimicrobials this admission: azithromycin 12/19 >>  vancomycin 12/19 >>  cefepime 12/19 >>   Microbiology results: Pending  Thank you for allowing pharmacy to be a part of this patients care.  Lorelei Pont, PharmD, BCPS 08/12/2021 10:41 PM ED Clinical Pharmacist -  [phone]

## 2021-08-12 NOTE — ED Triage Notes (Signed)
Patient diagnosed with post-COVID pneumonia three days ago and prescribed antibiotics and steroids. Patient here because shortness of breath and vomiting are worse than before he started antibiotics. Patient alert, oriented, room air SpO2 93%, in no apparent distress at this time.

## 2021-08-12 NOTE — Assessment & Plan Note (Signed)
?   Patient is currently chest pain free ?? Monitoring patient on telemetry ?? Continue home regimen of antiplatelet therapy, lipid lowering therapy  ? ?

## 2021-08-12 NOTE — Assessment & Plan Note (Signed)
Strict intake and output monitoring Creatinine near baseline Minimizing nephrotoxic agents as much as possible Serial chemistries to monitor renal function and electrolytes  

## 2021-08-12 NOTE — H&P (Signed)
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  Of note, patient was recently self diagnosed with COVID-19 via home testing on 12/5.  After a telehealth visit with his primary care provider patient was placed on a course of Molnupiravir which he completed as instructed.  Due to persisting symptoms, primary care provider was concerned for developing post COVID-pneumonia and initiated the patient on a dose of IM ceftriaxone on 12/16 followed by a home-going regimen of azithromycin and cefdinir.     Patient explains that despite having been recently prescribed with the antibiotic regimen noted above he has continued to experience increasing shortness of breath.  Shortness of breath initially was mild in intensity but progressively has become more more severe.  Shortness of breath is associated with episodes of intense nearly debilitating cough and continued episodic fevers. Cough has been productive with yellow sputum.  As the days have progressed patient has developed generalized weakness and decreasing oral intake.    Shortness of breath has continued to progress and has now become particularly severe and occurs with even the slightest bit of exertion.  Because of this patient presents to [facility] emerged part for evaluation.  Upon evaluation in the emergency  department CT angiogram of the chest reveals no evidence of pulmonary embolism but does reveal bilateral patchy developing infiltrates concerning for multifocal pneumonia.  Patient was not found to exhibit any leukocytosis or lactic acidosis but was found to have modest hyponatremia at 127.  ER provider initiated 1 L of normal saline in addition to 750 mg of levofloxacin.  Patient was not found to be hypoxic but in continued respiratory distress.  Due to patient's continued symptoms hospitalist group is now been called to assess the patient for admission to the hospital.  Review of Systems:   Review of Systems  Constitutional:  Positive for fever and malaise/fatigue.  Respiratory:  Positive for cough and shortness of breath.   Neurological:  Positive for weakness.   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  ANCA antibodies- myeloperoxidase ABS-greater than 100, ANCA proteinase 3-less than 3.5 04/02/2019- ANCA titers- p-ANCA +1: 160, C ANCA-less than 1: 20, atypical p-ANCA titer-less than 1: 20   Abnormal CT of the chest    Abnormal findings on diagnostic imaging of lung 04/12/2019   04/01/2019-CT chest with contrast- no acute process in chest abdomen or pelvis, interstitial lung disease suspicious for early or mild UIP, pulmonary artery enlargement suggest PAH    Acute low back pain without sciatica 03/08/2020   Acute low back pain without sciatica 03/08/2020   Acute lower UTI 07/07/2020   Acute pain of left knee 11/30/2019   AKI (acute kidney injury) ([place])    Alpha-1-antitrypsin deficiency carrier 05/04/2019   Anemia    Atherosclerotic heart disease of native coronary artery without angina pectoris 05/03/2018   Body mass index (BMI) 29.0-29.9, adult 01/05/2020   Bradycardia  Burning sensation of feet 01/05/2020   CKD (chronic kidney disease) 05/03/2018   Community acquired pneumonia    Community acquired pneumonia    Coronary artery disease     Cough    Diabetes ([place])    Diabetes mellitus type 2 in obese ([place]) 04/01/2019   Dyslipidemia associated with type 2 diabetes mellitus ([place]) 05/03/2018   Dysuria 09/07/2020   Elevated LFTs 04/01/2019   Elevated rheumatoid factor 04/12/2019   04/03/2019-rheumatoid factor-95.4   Emphysema (subcutaneous) (surgical) resulting from a procedure 05/03/2018   Essential hypertension 05/03/2018   Fatigue 03/29/2021   Fever    GAD (generalized anxiety disorder)    GERD (gastroesophageal reflux disease)    Hematuria 09/07/2020   History of alpha-1-antitrypsin deficiency 04/12/2019   History of kidney stones    Hyperlipidemia    Hypoxemia    Idiopathic pulmonary fibrosis ([place]) 03/29/2021   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[place]) 03/29/2021   Insomnia 05/03/2018   Interstitial pulmonary disease ([place]) 04/12/2019   04/01/2019-CT chest with contrast- no acute process in chest abdomen or pelvis, interstitial lung disease suspicious for early or mild UIP, pulmonary artery enlargement suggest PAH  04/02/2019-connective tissue work-up: Anti-Jo 1-negative Anti-DNA antibody double-stranded-negative Anti-scleroderma antibody-negative Sjogren's syndrome antibody-negative Sjogren's syndrome antibody-negative CK-31 CCP-6 E   Leukocytosis 03/08/2020   Low vitamin B12 level 04/03/2019   Lower extremity edema    Medicare annual wellness visit, subsequent 08/09/2020   Microscopic polyangiitis ([place]) 03/29/2021   Mixed hyperlipidemia 05/03/2018   Myalgia 04/19/2019   Osteoarthritis    Other emphysema ([place])    Other long term (current) drug therapy 03/29/2021   Paresthesias in left hand 11/30/2019   Peripheral polyneuropathy 01/30/2020   Pneumonia 04/01/2019   Pneumonitis    Pneumonitis    Polymyositis ([place])    Primary insomnia    Proteinuria 09/07/2020   Renal insufficiency 09/06/2020   Renal stones    Rheumatoid factor positive 03/29/2021   Sepsis ([place]) 04/19/2019   Severe sepsis ([place]) 07/07/2020   Skin cancer    Status  post total left knee replacement 12/17/2020   Transaminitis    Trigger finger 05/03/2018   UC (ulcerative colitis) ([place])    Urinary urgency 09/07/2020   UTI (urinary tract infection)    Vasculitis ([place]) 03/29/2021    Past Surgical History:  Procedure Laterality Date   ANGIOPLASTY  2010   BACK SURGERY     CATARACT EXTRACTION     COLONOSCOPY  06/16/2005   Mild colitis involving splenic flexure. Colonic polyps, status post polypectomy. Mild pancolonic diverticulitits. Internal hemorrhoids.    ESOPHAGOGASTRODUODENOSCOPY  04/26/2003   Irregular Z line suggestive of GERD. Mild gastritis status post CLO testing.    TOTAL KNEE ARTHROPLASTY Left 12/17/2020   Procedure: LEFT TOTAL KNEE ARTHROPLASTY;  Surgeon: Leandrew Koyanagi, MD;  Location: [facility];  Service: Orthopedics;  Laterality: Left;   TRIGGER FINGER RELEASE       reports that he has never smoked. He has never used smokeless tobacco. He reports that he does not drink alcohol and does not use drugs.  Allergies  Allergen Reactions   Ace Inhibitors Other (See Comments)    = Slow heart rate   Hydrocodone Nausea And Vomiting   Hydrocodone-Acetaminophen Nausea And Vomiting   Nsaids Other (See Comments)    Kidney issues   Sulfamethoxazole Nausea And Vomiting   Sulfamethoxazole-Trimethoprim Nausea And Vomiting   Trimethoprim Other (See Comments)    Reaction not recalled    Family History  Problem Relation Age of  Onset   Tuberculosis Mother    Stroke Father    Pancreatic cancer Sister    Heart attack Sister    Lung disease Sister    Clotting disorder Brother    Colon cancer Neg Hx    Esophageal cancer Neg Hx      Prior to Admission medications   Medication Sig Start Date End Date Taking? Authorizing Provider  aspirin EC 81 MG tablet Take 1 tablet (81 mg total) by mouth 2 (two) times daily. To be taken after surgery Patient taking differently: Take 81 mg by mouth in the morning. 11/07/20  Yes Dwana Melena L, PA-C  azithromycin  (ZITHROMAX) 250 MG tablet 2 DAILY FOR FIRST DAY, THEN DECREASE TO ONE DAILY FOR 4 MORE DAYS. Patient taking differently: Take 250-500 mg by mouth See admin instructions. Starting on 08/09/2021, take 500 mg by mouth on day one, then decrease to 250 mg once a day for 4 days 08/09/21  Yes Cox, Kirsten, MD  cefdinir (OMNICEF) 300 MG capsule Take 1 capsule (300 mg total) by mouth 2 (two) times daily. 08/09/21  Yes Cox, Kirsten, MD  Coenzyme Q10 100 MG TABS Take 100 mg by mouth at bedtime.   Yes [provider]  furosemide (LASIX) 20 MG tablet Take 20 mg by mouth daily as needed for fluid.   Yes [provider]  gabapentin (NEURONTIN) 400 MG capsule Take 400 mg by mouth 2 (two) times daily.   Yes [provider]  Multiple Vitamins-Minerals (PRESERVISION AREDS 2) CAPS Take 1 capsule by mouth 2 (two) times daily after a meal.   Yes [provider]  nitroGLYCERIN (NITROSTAT) 0.4 MG SL tablet Place 1 tablet (0.4 mg total) under the tongue every 5 (five) minutes as needed for chest pain. 09/06/20  Yes Revankar, Reita Cliche, MD  Omega-3 Fatty Acids (FISH OIL) 1000 MG CAPS Take 2,000 mg by mouth daily.   Yes [provider]  omeprazole (PRILOSEC) 20 MG capsule Take 1 capsule (20 mg total) by mouth daily. Patient taking differently: Take 20 mg by mouth daily before breakfast. 04/23/21  Yes Cox, Kirsten, MD  riTUXimab (RITUXAN) 100 MG/10ML injection Inject 100 mg into the vein every 6 (six) months.   Yes [provider]  rosuvastatin (CRESTOR) 20 MG tablet Take 1 tablet (20 mg total) by mouth daily. Patient taking differently: Take 20 mg by mouth at bedtime. 04/23/21  Yes Cox, Kirsten, MD  vitamin B-12 (CYANOCOBALAMIN) 1000 MCG tablet Take 1 tablet (1,000 mcg total) by mouth daily. Patient taking differently: Take 1,000 mcg by mouth daily with breakfast. 04/06/19  Yes Vann, Jessica U, DO  docusate sodium (COLACE) 100 MG capsule Take 100 mg by mouth 2 (two) times daily  as needed for mild constipation. Patient not taking: Reported on 08/12/2021    [provider]  glucose blood (TRUE METRIX BLOOD GLUCOSE TEST) test strip E11.69 TEST BLOOD SUGAR THREE TIMES DAILY BEFORE MEALS 10/03/20   [name], MD    Physical Exam: Vitals:   08/12/21 1745 08/12/21 1845 08/12/21 1943 08/12/21 2000  BP: (!) 150/104 121/61 133/69 (!) 148/68  Pulse: 78 69 68 75  Resp: (!) 25 20 (!) 23 (!) 25  Temp: 99.6 F (37.6 C)  98.3 F (36.8 C)   TempSrc: Oral     SpO2: 96% 92% 96% 90%    Constitutional: Awake alert and oriented x3, mild respiratory distress.  Skin: no rashes, no lesions, poor skin turgor noted. Eyes: Pupils are equally  reactive to light.  No evidence of scleral icterus or conjunctival pallor.  ENMT: dry mucous membranes noted.  Posterior pharynx clear of any exudate or lesions.   Neck: normal, supple, no masses, no thyromegaly.  No evidence of jugular venous distension.   Respiratory: Bibasilar and mid field rales with mild intermittent expiratory wheezing. Increased respiratory effort without accessory muscle use.  Cardiovascular: Regular rate and rhythm, no murmurs / rubs / gallops. No extremity edema. 2+ pedal pulses. No carotid bruits.  Chest:   Nontender without crepitus or deformity.   Back:   Nontender without crepitus or deformity. Abdomen: Abdomen is soft and nontender.  No evidence of intra-abdominal masses.  Positive bowel sounds noted in all quadrants.   Musculoskeletal: No joint deformity upper and lower extremities. Good ROM, no contractures. Normal muscle tone.  Neurologic: CN 2-12 grossly intact. Sensation intact.  Patient moving all 4 extremities spontaneously.  Patient is following all commands.  Patient is responsive to verbal stimuli.   Psychiatric: Patient exhibits normal mood with appropriate affect.  Patient seems to possess insight as to their current situation.     Labs on Admission: I have personally reviewed following labs  and imaging studies -   CBC: Recent Labs  Lab 08/07/21 1031 08/12/21 1110  WBC 7.4 9.7  NEUTROABS 4.2 7.5  HGB 14.3 15.1  HCT 42.8 44.2  MCV 85 84.7  PLT 235 948   Basic Metabolic Panel: Recent Labs  Lab 08/07/21 1031 08/12/21 1110  NA 139 127*  K 4.4 4.1  CL 100 94*  CO2 21 22  GLUCOSE 114* 144*  BUN 14 14  CREATININE 1.23 1.35*  CALCIUM 8.3* 8.2*   GFR: Estimated Creatinine Clearance: 44.2 mL/min (A) (by C-G formula based on SCr of 1.35 mg/dL (H)). Liver Function Tests: Recent Labs  Lab 08/07/21 1031 08/12/21 1110  AST 21 34  ALT 13 22  ALKPHOS 140* 114  BILITOT 0.6 1.0  PROT 6.0 6.1*  ALBUMIN 3.9 3.1*   No results for input(s): LIPASE, AMYLASE in the last 168 hours. No results for input(s): AMMONIA in the last 168 hours. Coagulation Profile: No results for input(s): INR, PROTIME in the last 168 hours. Cardiac Enzymes: No results for input(s): CKTOTAL, CKMB, CKMBINDEX, TROPONINI in the last 168 hours. BNP (last 3 results) No results for input(s): PROBNP in the last 8760 hours. HbA1C: No results for input(s): HGBA1C in the last 72 hours. CBG: No results for input(s): GLUCAP in the last 168 hours. Lipid Profile: No results for input(s): CHOL, HDL, LDLCALC, TRIG, CHOLHDL, LDLDIRECT in the last 72 hours. Thyroid Function Tests: No results for input(s): TSH, T4TOTAL, FREET4, T3FREE, THYROIDAB in the last 72 hours. Anemia Panel: No results for input(s): VITAMINB12, FOLATE, FERRITIN, TIBC, IRON, RETICCTPCT in the last 72 hours. Urine analysis:    Component Value Date/Time   COLORURINE YELLOW 08/12/2021 1627   APPEARANCEUR CLEAR 08/12/2021 1627   LABSPEC 1.025 08/12/2021 1627   PHURINE 6.0 08/12/2021 1627   GLUCOSEU NEGATIVE 08/12/2021 1627   HGBUR TRACE (A) 08/12/2021 1627   BILIRUBINUR NEGATIVE 08/12/2021 1627   BILIRUBINUR neg 01/14/2021 1503   KETONESUR NEGATIVE 08/12/2021 1627   PROTEINUR 100 (A) 08/12/2021 1627   UROBILINOGEN 0.2 01/14/2021  1503   NITRITE NEGATIVE 08/12/2021 1627   LEUKOCYTESUR NEGATIVE 08/12/2021 1627    Radiological Exams on Admission - Personally Reviewed: DG Chest 2 View  Result Date: 08/12/2021 CLINICAL DATA:  Shortness of breath EXAM: CHEST - 2 VIEW COMPARISON:  Chest  x-ray 08/09/2021 FINDINGS: Heart size is upper normal. Mediastinum appears stable. Persistent diffuse chronic prominent reticular interstitial opacities with patchy hazy superimposed opacities bilaterally. No significant pleural effusion. No pneumothorax. IMPRESSION: No significant change since previous study. COPD with patchy hazy opacities which could represent superimposed infiltrate. Electronically Signed   By: Ofilia Neas M.D.   On: 08/12/2021 11:34   CT Angio Chest PE W and/or Wo Contrast  Result Date: 08/12/2021 CLINICAL DATA:  Shortness of breath. EXAM: CT ANGIOGRAPHY CHEST WITH CONTRAST TECHNIQUE: Multidetector CT imaging of the chest was performed using the standard protocol during bolus administration of intravenous contrast. Multiplanar CT image reconstructions and MIPs were obtained to evaluate the vascular anatomy. CONTRAST:  52mL OMNIPAQUE IOHEXOL 350 MG/ML SOLN COMPARISON:  October 14, 2019. FINDINGS: Cardiovascular: Satisfactory opacification of the pulmonary arteries to the segmental level. No evidence of pulmonary embolism. Mild cardiomegaly. No pericardial effusion. Atherosclerosis of thoracic aorta is noted without dissection. Mild coronary artery calcifications are noted. Mediastinum/Nodes: No enlarged mediastinal, hilar, or axillary lymph nodes. Thyroid gland, trachea, and esophagus demonstrate no significant findings. Lungs/Pleura: No pneumothorax or pleural effusion is noted. There is interval development of multiple patchy airspace opacities in both lungs concerning for multifocal pneumonia. Upper Abdomen: No acute abnormality. Musculoskeletal: No chest wall abnormality. No acute or significant osseous findings. Review  of the MIP images confirms the above findings. IMPRESSION: No definite evidence of pulmonary embolus. Interval development of multiple patchy airspace opacities in both lungs concerning for multifocal pneumonia. Mild coronary calcifications are noted. Aortic Atherosclerosis (ICD10-I70.0). Electronically Signed   By: Marijo Conception M.D.   On: 08/12/2021 18:30    EKG: Personally reviewed.  Rhythm is normal sinus rhythm with heart rate of 73 bpm with PVC's.  No dynamic ST segment changes appreciated.  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  Considering patient's immunocompromised state persisting COVID infection is also a possibility Obtaining blood cultures, CRP, procalcitonin Until work-up is complete patient should be treated with broad-spectrum intravenous antibacterials including vancomycin, cefepime and azithromycin Additionally providing patient with Solu-Medrol 0.5 mg/kg IV every 12hrs Patient is already completed a course of Molnupirivir and is 14 days out from diagnosis, I do not believe that initiation of antiviral therapy for COVID is necessary at this time If procalcitonin is normal will consider rapid de-escalation of antibacterial therapy Based on clinical response overnight obtaining a pulmonology consultation will be considered in the morning Supplemental oxygen for bouts of hypoxia As needed bronchodilator therapy for shortness of breath and wheezing Zinc and vitamin C supplementation Airborne and contact  isolation until 21 days out from initial COVID diagnosis considering continued symptoms   COVID-19 virus infection Please see assessment and plan above  Respiratory disorder concurrent with and due to microscopic polyangiitis ([place]) Has followed with Dr. Vaughan Browner with pulmonology in the outpatient setting  Also follows with outpatient rheumatology whom he receives rituximab infusions with  Due to immunocompromised state patient is being treated with broad-spectrum antibiotics Low threshold for consulting pulmonology during this hospitalization Remainder of assessment and plan as above  Hyponatremia Modest hyponatremia likely secondary to volume  depletion Gentle intravenous hydration with isotonic fluids Monitoring sodium levels with serial chemistries  Chronic kidney disease, stage 3a (H[facility]) Strict intake and output monitoring Creatinine near baseline Minimizing nephrotoxic agents as much as possible Serial chemistries to monitor renal function and electrolytes   Coronary artery disease involving native coronary artery of native heart without angina pectoris Patient is currently chest pain free Monitoring patient on telemetry Continue home regimen of antiplatelet therapy, lipid lowering therapy  Type 2 diabetes mellitus with diabetic polyneuropathy, without long-term current use of insulin ([place]) Patient been placed on Accu-Cheks before every meal and nightly with sliding scale insulin Hyperglycemia expected due to initiation of systemic steroids Hemoglobin A1C ordered Diabetic Diet   Mixed diabetic hyperlipidemia associated with type 2 diabetes mellitus ([place]) Continuing home regimen of lipid lowering therapy.   GERD without esophagitis Continuing home regimen of daily PPI therapy.   Diabetic polyneuropathy associated with type 2 diabetes mellitus ([place]) Continue home regimen of gabapentin      Code Status:  Full code  code status decision has been confirmed with:  patient Family Communication: Wife is at bedside who has been updated on plan of care.    Status is: Inpatient  Remains inpatient appropriate because: of multifocal pneumonia with failure of outpatient treatment regimen requiring IV antibiotics and systemic steroids.          Vernelle Emerald MD [facility] Pager [phone]  If 7PM-7AM, please contact night-coverage [email or web address] Use universal [place] password for that web site. If you do not have the password, please call the hospital operator.  08/12/2021, 10:38 PM

## 2021-08-12 NOTE — ED Notes (Signed)
Pt transported to CT via stretcher at this time.  

## 2021-08-13 ENCOUNTER — Other Ambulatory Visit: Payer: Self-pay

## 2021-08-13 LAB — GLUCOSE, CAPILLARY
Glucose-Capillary: 276 mg/dL — ABNORMAL HIGH (ref 70–99)
Glucose-Capillary: 251 mg/dL — ABNORMAL HIGH (ref 70–99)

## 2021-08-13 LAB — COMPREHENSIVE METABOLIC PANEL
ALT: 20 U/L (ref 0–44)
AST: 28 U/L (ref 15–41)
Albumin: 2.5 g/dL — ABNORMAL LOW (ref 3.5–5.0)
Alkaline Phosphatase: 105 U/L (ref 38–126)
Anion gap: 9 (ref 5–15)
BUN: 16 mg/dL (ref 8–23)
CO2: 20 mmol/L — ABNORMAL LOW (ref 22–32)
Calcium: 7.7 mg/dL — ABNORMAL LOW (ref 8.9–10.3)
Chloride: 101 mmol/L (ref 98–111)
Creatinine, Ser: 1.11 mg/dL (ref 0.61–1.24)
GFR, Estimated: >60 mL/min (ref >60)
Glucose, Bld: 202 mg/dL — ABNORMAL HIGH (ref 70–99)
Potassium: 4.4 mmol/L (ref 3.5–5.1)
Sodium: 130 mmol/L — ABNORMAL LOW (ref 135–145)
Total Bilirubin: 1.2 mg/dL (ref 0.3–1.2)
Total Protein: 5.1 g/dL — ABNORMAL LOW (ref 6.5–8.1)

## 2021-08-13 LAB — RESPIRATORY PANEL BY PCR

## 2021-08-13 LAB — CBC WITH DIFFERENTIAL/PLATELET
Abs Immature Granulocytes: 0.03 10*3/uL (ref 0.00–0.07)
Basophils Absolute: 0 10*3/uL (ref 0.0–0.1)
Basophils Relative: 0 %
Eosinophils Absolute: 0 10*3/uL (ref 0.0–0.5)
Eosinophils Relative: 0 %
HCT: 40.7 % (ref 39.0–52.0)
Hemoglobin: 13.3 g/dL (ref 13.0–17.0)
Immature Granulocytes: 1 %
Lymphocytes Relative: 7 %
Lymphs Abs: 0.4 10*3/uL — ABNORMAL LOW (ref 0.7–4.0)
MCH: 28 pg (ref 26.0–34.0)
MCHC: 32.7 g/dL (ref 30.0–36.0)
MCV: 85.7 fL (ref 80.0–100.0)
Monocytes Absolute: 0.2 10*3/uL (ref 0.1–1.0)
Monocytes Relative: 3 %
Neutro Abs: 5.1 10*3/uL (ref 1.7–7.7)
Neutrophils Relative %: 89 %
Platelets: 190 10*3/uL (ref 150–400)
RBC: 4.75 MIL/uL (ref 4.22–5.81)
RDW: 12.1 % (ref 11.5–15.5)
WBC: 5.7 10*3/uL (ref 4.0–10.5)
nRBC: 0 % (ref 0.0–0.2)

## 2021-08-13 LAB — MAGNESIUM: Magnesium: 1.9 mg/dL (ref 1.7–2.4)

## 2021-08-13 LAB — LACTIC ACID, PLASMA: Lactic Acid, Venous: 1.3 mmol/L (ref 0.5–1.9)

## 2021-08-13 LAB — MRSA NEXT GEN BY PCR, NASAL: MRSA by PCR Next Gen: NOT DETECTED

## 2021-08-13 LAB — CBG MONITORING, ED
Glucose-Capillary: 252 mg/dL — ABNORMAL HIGH (ref 70–99)
Glucose-Capillary: 179 mg/dL — ABNORMAL HIGH (ref 70–99)

## 2021-08-13 LAB — D-DIMER, QUANTITATIVE: D-Dimer, Quant: 1.22 ug/mL-FEU — ABNORMAL HIGH (ref 0.00–0.50)

## 2021-08-13 LAB — C-REACTIVE PROTEIN: CRP: 13.1 mg/dL — ABNORMAL HIGH (ref <1.0)

## 2021-08-13 MED ORDER — METHYLPREDNISOLONE SODIUM SUCC 125 MG IJ SOLR
80.0000 mg | Freq: Two times a day (BID) | INTRAMUSCULAR | Status: DC
Start: 2021-08-13 — End: 2021-08-15
  Administered 2021-08-13 – 2021-08-15 (×4): 80 mg via INTRAVENOUS
  Filled 2021-08-13 (×4): qty 2

## 2021-08-13 NOTE — ED Notes (Signed)
Admit Provider at bedside. 

## 2021-08-13 NOTE — Progress Notes (Signed)
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 -Patient was diagnosed with COVID-19 12/5, he was treated with Molnupiravir, as well received IM Rocephin 12/16 by PCP, and has been on azithromycin and cefdinir, NAD he still short of breath, CTA chest with no evidence of PE, but revealed bilateral patchy infiltrate concerning for multifocal pneumonia.    Assessment & Plan:   Principal Problem:   Pneumonia of both lungs due to infectious organism Active Problems:   Coronary artery disease involving native coronary artery of native heart without angina pectoris   GERD without esophagitis   Type 2 diabetes mellitus with diabetic polyneuropathy, without long-term current use of insulin (HCC)   Respiratory disorder concurrent with and due to microscopic polyangiitis (HCC)   Hyponatremia   Mixed diabetic hyperlipidemia associated with type 2 diabetes mellitus ([place])   Diabetic polyneuropathy associated with type 2 diabetes mellitus (HCC)   Chronic kidney disease, stage 3a ([place])   COVID-19 virus infection  Multifocal pneumonia due to infectious organism -Immunocompromise, on Rituxan, with known history of microscopic polyangiitis/pulmonary fibrosis. -Measuring findings are related to multifocal pneumonia versus residual findings of COVID-19 pneumonia in this immunocompromised patient. -He is already treated  with Molnupiravir, initial diagnosis 15 days ago, I did not think remdesivir is indicated at this point -Continue with broad-spectrum IV antibiotics. -Continue with IV steroids given history of microscopic polyangiitis/pulmonary fibrosis. -Was encouraged to use incentive spirometry and flutter valve    COVID-19 virus infection -Please see above discussion   Respiratory disorder concurrent with and due to microscopic polyangiitis ([place]) - Has followed with Dr. Vaughan Browner with pulmonology in the outpatient setting  - Also follows with outpatient rheumatology whom he receives rituximab infusions, wife reports most recent dose was last June, he was due last month to receive another dose. - Due to immunocompromised state patient is being treated with broad-spectrum antibiotics  Hyponatremia - Improving with gentle hydration  Chronic kidney disease, stage 3a (HCC) -Avoid nephrotoxic medications   Coronary artery disease involving native coronary artery of native heart without angina pectoris -Chest pain-free, continue with home medication of antiplatelet therapy and lipid-lowering agents.   Type 2 diabetes mellitus with diabetic polyneuropathy, without long-term current use of insulin (HCC) -Keep on insulin sliding scale as he is on steroids.    Mixed diabetic hyperlipidemia associated with type 2 diabetes mellitus ([place]) - Continuing home regimen of lipid lowering therapy.     GERD without esophagitis - Continuing home regimen of daily PPI therapy.     Diabetic polyneuropathy associated with type 2 diabetes mellitus ([place]) - Continue home regimen of gabapentin       DVT prophylaxis: Lovenox Code Status: Full Family Communication: wife at bedside Disposition:   Status is: Inpatient  Remains inpatient appropriate because: IV ABX and sterois       Consultants:  none   Subjective:  Patient reports generalized weakness, fatigue, and dyspnea, he reported cough,  nonproductive Objective: Vitals:   08/13/21 0900 08/13/21 1100 08/13/21 1200 08/13/21 1326  BP: 136/64 120/72 (!) 119/93 128/70  Pulse: 66 66 67 72  Resp: (!) 22 18 (!) 25 (!) 22  Temp:      TempSrc:      SpO2: 92% 95% 96% 90%  Weight:      Height:        Intake/Output Summary (Last 24 hours) at 08/13/2021 1352 Last data filed at 08/13/2021 1003 Gross per 24 hour  Intake 1100 ml  Output --  Net 1100 ml   Filed Weights   08/13/21 0700  Weight: 85.7 kg    Examination:  Awake Alert, Oriented X 3, No new F.N deficits, Normal affect, frail, deconditioned Symmetrical Chest wall movement, diminished entry at the bases with scattered Rales RRR,No Gallops,Rubs or new Murmurs, No Parasternal Heave +ve B.Sounds, Abd Soft, No tenderness, No rebound - guarding or rigidity. No Cyanosis, Clubbing or edema, No new Rash or bruise       Data Reviewed: I have personally reviewed following labs and imaging studies  CBC: Recent Labs  Lab 08/07/21 1031 08/12/21 1110 08/13/21 0419  WBC 7.4 9.7 5.7  NEUTROABS 4.2 7.5 5.1  HGB 14.3 15.1 13.3  HCT 42.8 44.2 40.7  MCV 85 84.7 85.7  PLT 235 256 833    Basic Metabolic Panel: Recent Labs  Lab 08/07/21 1031 08/12/21 1110 08/13/21 0419  NA 139 127* 130*  K 4.4 4.1 4.4  CL 100 94* 101  CO2 21 22 20*  GLUCOSE 114* 144* 202*  BUN 14 14 16   CREATININE 1.23 1.35* 1.11  CALCIUM 8.3* 8.2* 7.7*  MG  --   --  1.9    GFR: Estimated Creatinine Clearance: 52.7 mL/min (by C-G formula based on SCr of 1.11 mg/dL).  Liver Function Tests: Recent Labs  Lab 08/07/21 1031 08/12/21 1110 08/13/21 0419  AST 21 34 28  ALT 13 22 20   ALKPHOS 140* 114 105  BILITOT 0.6 1.0 1.2  PROT 6.0 6.1* 5.1*  ALBUMIN 3.9 3.1* 2.5*    CBG: Recent Labs  Lab 08/13/21 0811 08/13/21 1241  GLUCAP 252* 179*     Recent Results (from the past 240 hour(s))  Resp Panel by RT-PCR (Flu A&B, Covid) Nasopharyngeal Swab     Status: Abnormal   Collection  Time: 08/12/21 11:11 AM   Specimen: Nasopharyngeal Swab; Nasopharyngeal(NP) swabs in vial transport medium  Result Value Ref Range Status   SARS Coronavirus 2 by RT PCR POSITIVE (A) NEGATIVE Final    Comment: (NOTE) SARS-CoV-2 target nucleic acids are DETECTED.  The SARS-CoV-2 RNA is generally detectable in upper respiratory specimens during the acute phase of infection. Positive results are indicative of the presence of the identified virus, but do not rule out bacterial infection or co-infection with other pathogens not detected by the test. Clinical correlation with patient history and other diagnostic information is necessary to determine patient infection status. The expected result is Negative.  Fact Sheet for Patients: EntrepreneurPulse.com.au  Fact Sheet for Healthcare Providers: IncredibleEmployment.be  This test is not yet approved or cleared by the [place] FDA and  has been authorized for detection and/or diagnosis of SARS-CoV-2 by FDA under an Emergency Use Authorization (EUA).  This EUA will remain in effect (meaning this test can be used) for the duration of  the COVID-19 declaration under Section 564(b)(1) of the A ct, 21 U.S.C. section 360bbb-3(b)(1), unless the authorization is terminated or revoked sooner.     Influenza A by PCR NEGATIVE NEGATIVE Final   Influenza B by  PCR NEGATIVE NEGATIVE Final    Comment: (NOTE) The Xpert Xpress SARS-CoV-2/FLU/RSV plus assay is intended as an aid in the diagnosis of influenza from Nasopharyngeal swab specimens and should not be used as a sole basis for treatment. Nasal washings and aspirates are unacceptable for Xpert Xpress SARS-CoV-2/FLU/RSV testing.  Fact Sheet for Patients: EntrepreneurPulse.com.au  Fact Sheet for Healthcare Providers: IncredibleEmployment.be  This test is not yet approved or cleared by the [place] FDA and has been  authorized for detection and/or diagnosis of SARS-CoV-2 by FDA under an Emergency Use Authorization (EUA). This EUA will remain in effect (meaning this test can be used) for the duration of the COVID-19 declaration under Section 564(b)(1) of the Act, 21 U.S.C. section 360bbb-3(b)(1), unless the authorization is terminated or revoked.  Performed at [facility], [place] [address] [place], [place] [address]   Culture, blood (routine x 2)     Status: None (Preliminary result)   Collection Time: 08/12/21  9:34 PM   Specimen: BLOOD LEFT HAND  Result Value Ref Range Status   Specimen Description BLOOD LEFT HAND  Final   Special Requests   Final    BOTTLES DRAWN AEROBIC AND ANAEROBIC Blood Culture adequate volume   Culture   Final    NO GROWTH < 12 HOURS Performed at [facility], [place] [address], [place], [place] [address]    Report Status PENDING  Incomplete  Respiratory (~20 pathogens) panel by PCR     Status: None   Collection Time: 08/12/21  9:35 PM   Specimen: Nasopharyngeal Swab; Respiratory  Result Value Ref Range Status   Adenovirus NOT DETECTED NOT DETECTED Final   Coronavirus 229E NOT DETECTED NOT DETECTED Final    Comment: (NOTE) The Coronavirus on the Respiratory Panel, DOES NOT test for the novel  Coronavirus (2019 nCoV)    Coronavirus HKU1 NOT DETECTED NOT DETECTED Final   Coronavirus NL63 NOT DETECTED NOT DETECTED Final   Coronavirus OC43 NOT DETECTED NOT DETECTED Final   Metapneumovirus NOT DETECTED NOT DETECTED Final   Rhinovirus / Enterovirus NOT DETECTED NOT DETECTED Final   Influenza A NOT DETECTED NOT DETECTED Final   Influenza B NOT DETECTED NOT DETECTED Final   Parainfluenza Virus 1 NOT DETECTED NOT DETECTED Final   Parainfluenza Virus 2 NOT DETECTED NOT DETECTED Final   Parainfluenza Virus 3 NOT DETECTED NOT DETECTED Final   Parainfluenza Virus 4 NOT DETECTED NOT DETECTED Final   Respiratory Syncytial Virus NOT DETECTED NOT DETECTED Final    Bordetella pertussis NOT DETECTED NOT DETECTED Final   Bordetella Parapertussis NOT DETECTED NOT DETECTED Final   Chlamydophila pneumoniae NOT DETECTED NOT DETECTED Final   Mycoplasma pneumoniae NOT DETECTED NOT DETECTED Final    Comment: Performed at [facility], [place]. [address], [place], [place] [address]  Culture, blood (routine x 2)     Status: None (Preliminary result)   Collection Time: 08/12/21  9:39 PM   Specimen: BLOOD RIGHT HAND  Result Value Ref Range Status   Specimen Description BLOOD RIGHT HAND  Final   Special Requests   Final    BOTTLES DRAWN AEROBIC AND ANAEROBIC Blood Culture adequate volume   Culture   Final    NO GROWTH < 12 HOURS Performed at [facility], [place] [address], [place], [place] [address]    Report Status PENDING  Incomplete  MRSA Next Gen by PCR, Nasal     Status: None   Collection Time: 08/12/21 10:42  PM   Specimen: Nasal Mucosa; Nasal Swab  Result Value Ref Range Status   MRSA by PCR Next Gen NOT DETECTED NOT DETECTED Final    Comment: (NOTE) The GeneXpert MRSA Assay (FDA approved for NASAL specimens only), is one component of a comprehensive MRSA colonization surveillance program. It is not intended to diagnose MRSA infection nor to guide or monitor treatment for MRSA infections. Test performance is not FDA approved in patients less than 67 years old. Performed at [facility], [place] [address], [place],  [address]          Radiology Studies: DG Chest 2 View  Result Date: 08/12/2021 CLINICAL DATA:  Shortness of breath EXAM: CHEST - 2 VIEW COMPARISON:  Chest x-ray 08/09/2021 FINDINGS: Heart size is upper normal. Mediastinum appears stable. Persistent diffuse chronic prominent reticular interstitial opacities with patchy hazy superimposed opacities bilaterally. No significant pleural effusion. No pneumothorax. IMPRESSION: No significant change since previous study. COPD with patchy hazy opacities which could  represent superimposed infiltrate. Electronically Signed   By: Ofilia Neas M.D.   On: 08/12/2021 11:34   CT Angio Chest PE W and/or Wo Contrast  Result Date: 08/12/2021 CLINICAL DATA:  Shortness of breath. EXAM: CT ANGIOGRAPHY CHEST WITH CONTRAST TECHNIQUE: Multidetector CT imaging of the chest was performed using the standard protocol during bolus administration of intravenous contrast. Multiplanar CT image reconstructions and MIPs were obtained to evaluate the vascular anatomy. CONTRAST:  54mL OMNIPAQUE IOHEXOL 350 MG/ML SOLN COMPARISON:  October 14, 2019. FINDINGS: Cardiovascular: Satisfactory opacification of the pulmonary arteries to the segmental level. No evidence of pulmonary embolism. Mild cardiomegaly. No pericardial effusion. Atherosclerosis of thoracic aorta is noted without dissection. Mild coronary artery calcifications are noted. Mediastinum/Nodes: No enlarged mediastinal, hilar, or axillary lymph nodes. Thyroid gland, trachea, and esophagus demonstrate no significant findings. Lungs/Pleura: No pneumothorax or pleural effusion is noted. There is interval development of multiple patchy airspace opacities in both lungs concerning for multifocal pneumonia. Upper Abdomen: No acute abnormality. Musculoskeletal: No chest wall abnormality. No acute or significant osseous findings. Review of the MIP images confirms the above findings. IMPRESSION: No definite evidence of pulmonary embolus. Interval development of multiple patchy airspace opacities in both lungs concerning for multifocal pneumonia. Mild coronary calcifications are noted. Aortic Atherosclerosis (ICD10-I70.0). Electronically Signed   By: Marijo Conception M.D.   On: 08/12/2021 18:30        Scheduled Meds:  albuterol  2 puff Inhalation Q6H   vitamin C  500 mg Oral Daily   aspirin EC  81 mg Oral Daily   enoxaparin (LOVENOX) injection  40 mg Subcutaneous Q24H   gabapentin  400 mg Oral BID   insulin aspart  0-15 Units  Subcutaneous TID AC & HS   methylPREDNISolone (SOLU-MEDROL) injection  80 mg Intravenous Q12H   pantoprazole  40 mg Oral Daily   rosuvastatin  20 mg Oral QHS   zinc sulfate  220 mg Oral Daily   Continuous Infusions:  sodium chloride 100 mL/hr at 08/12/21 2303   azithromycin     ceFEPime (MAXIPIME) IV Stopped (08/13/21 1003)   vancomycin       LOS: 1 day       Phillips Climes, MD [facility]   To contact the attending provider between 7A-7P or the covering provider during after hours 7P-7A, please log into the web site [email or web address] and access using universal [place] password for that web site. If you do not have the password, please call the  hospital operator.  08/13/2021, 1:52 PM

## 2021-08-13 NOTE — ED Notes (Addendum)
Pt given incentive spirometer, educated on use. Pt demonstrated proper use and first try was at 1250

## 2021-08-13 NOTE — ED Notes (Signed)
Pt ambulated in room with standby assistance from daughter. Pt sat up to eat lunch. Pt O2 saturation around 87-88% on room air while ambulating.

## 2021-08-14 LAB — CBC
HCT: 36.6 % — ABNORMAL LOW (ref 39.0–52.0)
Hemoglobin: 13.1 g/dL (ref 13.0–17.0)
MCH: 29.2 pg (ref 26.0–34.0)
MCHC: 35.8 g/dL (ref 30.0–36.0)
MCV: 81.5 fL (ref 80.0–100.0)
Platelets: 252 10*3/uL (ref 150–400)
RBC: 4.49 MIL/uL (ref 4.22–5.81)
RDW: 12 % (ref 11.5–15.5)
WBC: 15.2 10*3/uL — ABNORMAL HIGH (ref 4.0–10.5)
nRBC: 0 % (ref 0.0–0.2)

## 2021-08-14 LAB — BASIC METABOLIC PANEL
Anion gap: 9 (ref 5–15)
BUN: 20 mg/dL (ref 8–23)
CO2: 18 mmol/L — ABNORMAL LOW (ref 22–32)
Calcium: 7.9 mg/dL — ABNORMAL LOW (ref 8.9–10.3)
Chloride: 103 mmol/L (ref 98–111)
Creatinine, Ser: 1.04 mg/dL (ref 0.61–1.24)
GFR, Estimated: >60 mL/min (ref >60)
Glucose, Bld: 148 mg/dL — ABNORMAL HIGH (ref 70–99)
Potassium: 4 mmol/L (ref 3.5–5.1)
Sodium: 130 mmol/L — ABNORMAL LOW (ref 135–145)

## 2021-08-14 LAB — GLUCOSE, CAPILLARY
Glucose-Capillary: 179 mg/dL — ABNORMAL HIGH (ref 70–99)
Glucose-Capillary: 358 mg/dL — ABNORMAL HIGH (ref 70–99)
Glucose-Capillary: 312 mg/dL — ABNORMAL HIGH (ref 70–99)
Glucose-Capillary: 315 mg/dL — ABNORMAL HIGH (ref 70–99)

## 2021-08-14 MED ORDER — INSULIN ASPART 100 UNIT/ML IJ SOLN
4.0000 [IU] | Freq: Three times a day (TID) | INTRAMUSCULAR | Status: DC
  Administered 2021-08-14 – 2021-08-18 (×11): 4 [IU] via SUBCUTANEOUS

## 2021-08-14 MED ORDER — AZITHROMYCIN 500 MG PO TABS
500.0000 mg | ORAL_TABLET | Freq: Every day | ORAL | Status: DC
  Administered 2021-08-14 – 2021-08-17 (×4): 500 mg via ORAL
  Filled 2021-08-14 (×4): qty 1

## 2021-08-14 MED ORDER — INSULIN GLARGINE-YFGN 100 UNIT/ML ~~LOC~~ SOLN
12.0000 [IU] | Freq: Every day | SUBCUTANEOUS | Status: DC
  Administered 2021-08-14 – 2021-08-15 (×2): 12 [IU] via SUBCUTANEOUS
  Filled 2021-08-14 (×2): qty 0.12

## 2021-08-14 NOTE — Plan of Care (Signed)

## 2021-08-14 NOTE — Evaluation (Signed)
Physical Therapy Evaluation Patient Details Name: Kerry Matthews MRN: [id number] DOB: 25-Apr-1936 Today's Date: 08/14/2021  History of Present Illness  Pt is a 85 y.o. M who presents 08/12/2021 with multifocal PNA due to infectious organism. Diagnosed with COVID-19 12/5. Significant PMH: ILD, pulmonary fibrosis, HTN, HLD, DM2, diabetic polyneuropathy, Neri artery disease, chronic low back pain.  Clinical Impression  PTA, pt lives with his spouse and is independent; he enjoys doing yard work. Pt presents with decreased cardiopulmonary endurance. Ambulating ~60 feet with no assistive device at a supervision level. SpO2 90-91% on RA at rest. Desaturation towards end of walk to 82-83%. Rebounded > 90% after a 2.5 minute seated rest break. Education provided regarding endurance conservation, home set up and activity recommendations. No follow up PT anticipated. Will continue to follow acutely.      Recommendations for follow up therapy are one component of a multi-disciplinary discharge planning process, led by the attending physician.  Recommendations may be updated based on patient status, additional functional criteria and insurance authorization.  Follow Up Recommendations No PT follow up    Assistance Recommended at Discharge PRN  Functional Status Assessment Patient has had a recent decline in their functional status and demonstrates the ability to make significant improvements in function in a reasonable and predictable amount of time.   Equipment Recommendations  BSC/3in1    Recommendations for Other Services       Precautions / Restrictions Precautions Precautions: Other (comment) Precaution Comments: watch O2 Restrictions Weight Bearing Restrictions: No      Mobility  Bed Mobility Overal bed mobility: Modified Independent                  Transfers Overall transfer level: Modified independent                      Ambulation/Gait Ambulation/Gait  assistance: Supervision Gait Distance (Feet): 60 Feet Assistive device: None Gait Pattern/deviations: Step-through pattern;Decreased stride length;Shuffle Gait velocity: decreased     General Gait Details: Shuffling gait pattern, decreased bilateral foot clearance, supervision for safety. cues for activity pacing  Stairs            Wheelchair Mobility    Modified Rankin (Stroke Patients Only)       Balance Overall balance assessment: Mild deficits observed, not formally tested                                           Pertinent Vitals/Pain Pain Assessment: No/denies pain    Home Living Family/patient expects to be discharged to:: Private residence Living Arrangements: Spouse/significant other Available Help at Discharge: Family;Available 24 hours/day Type of Home: House Home Access: Stairs to enter Entrance Stairs-Rails: Left Entrance Stairs-Number of Steps: 2   Home Layout: One level Home Equipment: Conservation officer, nature (2 wheels);Cane - single point      Prior Function Prior Level of Function : Independent/Modified Independent             Mobility Comments: enjoys doing yard work       Journalist, newspaper   Dominant Hand: Right    Extremity/Trunk Assessment   Upper Extremity Assessment Upper Extremity Assessment: Overall WFL for tasks assessed    Lower Extremity Assessment Lower Extremity Assessment: Overall WFL for tasks assessed       Communication   Communication: No difficulties  Cognition Arousal/Alertness: Awake/alert Behavior During Therapy: [facility]  for tasks assessed/performed Overall Cognitive Status: Within Functional Limits for tasks assessed                                          General Comments      Exercises General Exercises - Lower Extremity Long Arc Quad: Both;10 reps;Seated Hip Flexion/Marching: Both;10 reps;Seated   Assessment/Plan    PT Assessment Patient needs continued PT services   PT Problem List Decreased strength;Decreased activity tolerance;Decreased mobility;Decreased balance;Cardiopulmonary status limiting activity       PT Treatment Interventions DME instruction;Gait training;Functional mobility training;Therapeutic activities;Therapeutic exercise;Balance training;Patient/family education    PT Goals (Current goals can be found in the Care Plan section)  Acute Rehab PT Goals Patient Stated Goal: not have to wear oxygen PT Goal Formulation: With patient/family Time For Goal Achievement: 08/28/21 Potential to Achieve Goals: Good    Frequency Min 3X/week   Barriers to discharge        Co-evaluation               AM-PAC PT "6 Clicks" Mobility  Outcome Measure Help needed turning from your back to your side while in a flat bed without using bedrails?: None Help needed moving from lying on your back to sitting on the side of a flat bed without using bedrails?: None Help needed moving to and from a bed to a chair (including a wheelchair)?: A Little Help needed standing up from a chair using your arms (e.g., wheelchair or bedside chair)?: A Little Help needed to walk in hospital room?: A Little Help needed climbing 3-5 steps with a railing? : A Little 6 Click Score: 20    End of Session   Activity Tolerance: Patient tolerated treatment well Patient left: in bed;with call bell/phone within reach;with family/visitor present (pt sat up in chair prior to session) Nurse Communication: Mobility status;Other (comment) (left pt on RA) PT Visit Diagnosis: Unsteadiness on feet (R26.81);Difficulty in walking, not elsewhere classified (R26.2)    Time: 1000-1034 PT Time Calculation (min) (ACUTE ONLY): 34 min   Charges:   PT Evaluation $PT Eval Moderate Complexity: 1 Mod PT Treatments $Therapeutic Activity: 8-22 mins        Wyona Almas, PT, DPT Acute Rehabilitation Services Pager [phone] Office [phone]   Deno Etienne 08/14/2021, 12:55 PM

## 2021-08-14 NOTE — Evaluation (Signed)
Occupational Therapy Evaluation Patient Details Name: Kerry Matthews MRN: [id number] DOB: 12/27/1935 Today's Date: 08/14/2021   History of Present Illness Pt is a 85 y.o. M who presents 08/12/2021 with multifocal PNA due to infectious organism. Diagnosed with COVID-19 12/5. Significant PMH: ILD, pulmonary fibrosis, HTN, HLD, DM2, diabetic polyneuropathy, Neri artery disease, chronic low back pain.   Clinical Impression   Pt received supine in bed and agreeable to evaluation. He demonstrates overall decreased activity tolerance requiring increased assist for mobility as well as need for increased O2 support to maintain and recover O2 saturation. During LB dressing task pt with drop in O2 sats to 80%. Despite rest and pursed lip breathing pt unable to recover saturations to WNL. RN assisting OT with donning 1L via nasal cannula and pt able to recover to low 90s. Assisted pt with transfer to bedside chair to eat meal. Left with stable vitals and wife at bedside. Will continue to follow acutely for education in energy conservation techniques and use of AE for ADLs.      Recommendations for follow up therapy are one component of a multi-disciplinary discharge planning process, led by the attending physician.  Recommendations may be updated based on patient status, additional functional criteria and insurance authorization.   Follow Up Recommendations  Home health OT    Assistance Recommended at Discharge Set up Supervision/Assistance  Functional Status Assessment  Patient has had a recent decline in their functional status and demonstrates the ability to make significant improvements in function in a reasonable and predictable amount of time.  Equipment Recommendations  BSC/3in1    Recommendations for Other Services       Precautions / Restrictions Precautions Precautions: Other (comment) Precaution Comments: watch O2 Restrictions Weight Bearing Restrictions: No      Mobility Bed  Mobility Overal bed mobility: Modified Independent                  Transfers Overall transfer level: Modified independent Equipment used: 1 person hand held assist                      Balance Overall balance assessment: Mild deficits observed, not formally tested                   ADL either performed or assessed with clinical judgement   ADL Overall ADL's : Needs assistance/impaired                           Vision                Pertinent Vitals/Pain Pain Assessment: No/denies pain     Hand Dominance Right   Extremity/Trunk Assessment Upper Extremity Assessment Upper Extremity Assessment: Overall WFL for tasks assessed   Lower Extremity Assessment Lower Extremity Assessment: Overall WFL for tasks assessed       Communication Communication Communication: No difficulties   Cognition Arousal/Alertness: Awake/alert Behavior During Therapy: WFL for tasks assessed/performed Overall Cognitive Status: Within Functional Limits for tasks assessed                 General Comments  desats with mobility    Exercises Exercises: General Lower Extremity General Exercises - Lower Extremity Long Arc Quad: Both;10 reps;Seated Hip Flexion/Marching: Both;10 reps;Seated   Shoulder Instructions      Home Living Family/patient expects to be discharged to:: Private residence Living Arrangements: Spouse/significant other Available Help at Discharge: Family;Available 24 hours/day Type of  Home: House Home Access: Stairs to enter [employer] of Steps: 2 Entrance Stairs-Rails: Left Home Layout: One level     Bathroom Shower/Tub: Walk-in shower;Tub/shower unit   Bathroom Toilet: Handicapped height Bathroom Accessibility: Yes   Home Equipment: Conservation officer, nature (2 wheels);Cane - single point          Prior Functioning/Environment Prior Level of Function : Independent/Modified Independent             Mobility  Comments: enjoys doing yard work          OT Problem List: Decreased activity tolerance;Impaired balance (sitting and/or standing);Decreased knowledge of use of DME or AE;Cardiopulmonary status limiting activity      OT Treatment/Interventions: Self-care/ADL training;Energy conservation;DME and/or AE instruction;Patient/family education    OT Goals(Current goals can be found in the care plan section) Acute Rehab OT Goals Patient Stated Goal: return to PLOF OT Goal Formulation: With patient/family Time For Goal Achievement: 08/28/21 Potential to Achieve Goals: Fair ADL Goals Pt Will Perform Lower Body Bathing: with min guard assist;with adaptive equipment Pt Will Perform Lower Body Dressing: with min guard assist;with adaptive equipment Pt Will Transfer to Toilet: with supervision;ambulating Pt Will Perform Toileting - Clothing Manipulation and hygiene: with modified independence;with adaptive equipment  OT Frequency: Min 2X/week    AM-PAC OT "6 Clicks" Daily Activity     Outcome Measure Help from another person eating meals?: None Help from another person taking care of personal grooming?: A Little Help from another person toileting, which includes using toliet, bedpan, or urinal?: A Little Help from another person bathing (including washing, rinsing, drying)?: A Lot   Help from another person to put on and taking off regular lower body clothing?: A Lot 6 Click Score: 14   End of Session Nurse Communication: Mobility status  Activity Tolerance: Patient limited by fatigue Patient left: in chair;with family/visitor present;with nursing/sitter in room  OT Visit Diagnosis: Other abnormalities of gait and mobility (R26.89);Unsteadiness on feet (R26.81)                Time: 3220-2542 OT Time Calculation (min): 27 min Charges:  OT General Charges $OT Visit: 1 Visit OT Evaluation $OT Eval Moderate Complexity: 1 Mod OT Treatments $Self Care/Home Management : 8-22  mins   Naomie Dean [place], OTR/L 08/14/2021, 1:03 PM

## 2021-08-14 NOTE — Progress Notes (Signed)
°  Transition of Care [facility]) Screening Note   Patient Details  Name: Kerry Matthews Date of Birth: Feb 16, 1936   Transition of Care [facility]) CM/SW Contact:    Cyndi Bender, RN Phone Number: 08/14/2021, 1:05 PM    Transition of Care Department [facility]) has reviewed patient and no TOC needs have been identified at this time. We will continue to monitor patient advancement through interdisciplinary progression rounds. If new patient transition needs arise, please place a TOC consult.

## 2021-08-14 NOTE — Progress Notes (Signed)
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  Subjective: Continues to cough-claims shortness of breath is better.  Objective: Vitals: Blood pressure 137/83, pulse 87, temperature 98 F (36.7 C), temperature source Axillary, resp. rate (!) 23, height 5\' 7"  (1.702 m), weight 85.7 kg, SpO2 90 %.   Physical exam: Gen Exam:Alert awake-not in any distress HEENT:atraumatic, normocephalic Chest: B/L clear to auscultation anteriorly CVS:S1S2 regular Abdomen:soft non tender, non distended Extremities:no edema Neurology: Non focal Skin: no rash    Recent Labs    08/12/21 2136 08/13/21 0419  DDIMER  --  1.22*  CRP 12.4* 13.1*    Assessment & Plan: Acute hypoxic respiratory failure due to multifocal pneumonia in a immunocompromised patient with recent COVID-19 infection: Likely etiology due to ongoing COVID-19 pneumonitis and superimposed bacterial pneumonia.  Improving with steroids and empiric antibiotics.  Blood cultures negative so far-stop IV vancomycin today-and continue with cefepime.  Have asked RN to see if he can continue to titrate down FiO2.  If no improvement with steroids/antibiotics-May need to consult PCCM.  Evaluate for home O2 requirement prior to discharge.  History of ILD/microscopic polyangiitis: On Rituxan every 6 months (last dose June 2022)-we will schedule to get a injection earlier this month-however has now been postponed due to COVID-19  diagnoses.  CKD stage IIIa: Creatinine at baseline-watch closely.  Hyponatremia: Improving with gentle hydration.  CAD: No anginal symptoms-continue aspirin/statin.  HLD: Continue statin  DM-2 (A1c 6.3 on 12/14) with uncontrolled hyperglycemia due to steroids: CBGs uncontrolled-add Semglee 12 units daily, fallings of NovoLog with meals-continue SSI.  Follow and adjust  Recent Labs    08/13/21 2107 08/14/21 0753 08/14/21 1158  GLUCAP 251* 179* 358*    Peripheral neuropathy: Likely due to DM-continue Neurontin  HLD: Continue statin  GERD: Continue PPI   DVT prophylaxis: Lovenox Code Status: Full Family Communication: wife at bedside Disposition:   Status is: Inpatient  Remains inpatient appropriate because: IV ABX and sterois       Consultants:  none   Data Reviewed: I have personally reviewed following labs and imaging studies  CBC: Recent Labs  Lab 08/12/21 1110 08/13/21 0419 08/14/21 0101  WBC 9.7 5.7 15.2*  NEUTROABS 7.5 5.1  --   HGB 15.1 13.3 13.1  HCT 44.2 40.7 36.6*  MCV 84.7 85.7 81.5  PLT 256 190 252     Basic Metabolic Panel: Recent Labs  Lab 08/12/21 1110 08/13/21 0419 08/14/21 0101  NA 127* 130* 130*  K 4.1 4.4 4.0  CL 94* 101 103  CO2 22 20* 18*  GLUCOSE 144* 202* 148*  BUN 14 16 20   CREATININE 1.35* 1.11 1.04  CALCIUM 8.2* 7.7* 7.9*  MG  --  1.9  --      GFR: Estimated Creatinine Clearance: 56.3 mL/min (by C-G formula based on SCr of 1.04 mg/dL).  Liver Function Tests: Recent Labs  Lab 08/12/21 1110 08/13/21 0419  AST 34 28  ALT 22 20  ALKPHOS 114 105  BILITOT 1.0 1.2  PROT 6.1* 5.1*  ALBUMIN 3.1* 2.5*     CBG: Recent Labs  Lab 08/13/21 1241 08/13/21 1838 08/13/21 2107 08/14/21 0753 08/14/21 1158  GLUCAP 179* 276* 251* 179* 358*      Recent Results (from the past 240 hour(s))  Resp Panel by RT-PCR (Flu A&B, Covid) Nasopharyngeal Swab     Status: Abnormal   Collection Time: 08/12/21 11:11 AM    Specimen: Nasopharyngeal Swab; Nasopharyngeal(NP) swabs in vial transport medium  Result Value Ref Range Status   SARS Coronavirus 2 by RT PCR POSITIVE (A) NEGATIVE Final    Comment: (NOTE) SARS-CoV-2 target nucleic acids are DETECTED.  The SARS-CoV-2 RNA is generally detectable in upper respiratory specimens during the acute phase of infection. Positive results are indicative of the presence of the identified virus, but do not rule out bacterial infection or co-infection with other pathogens not detected by the test. Clinical correlation with patient history and other diagnostic information is necessary to determine patient infection status. The expected result is Negative.  Fact Sheet for Patients: EntrepreneurPulse.com.au  Fact Sheet for Healthcare Providers: IncredibleEmployment.be  This test is not yet approved or cleared by the [place] FDA and  has been authorized for detection and/or diagnosis of SARS-CoV-2 by FDA under an Emergency Use Authorization (EUA).  This EUA will remain in effect (meaning this test can be used) for the duration of  the COVID-19 declaration under Section 564(b)(1) of the A ct, 21 U.S.C. section 360bbb-3(b)(1), unless the authorization is terminated or revoked sooner.     Influenza A by PCR NEGATIVE NEGATIVE Final   Influenza B by PCR NEGATIVE NEGATIVE Final    Comment: (NOTE) The Xpert Xpress SARS-CoV-2/FLU/RSV plus assay is intended as an aid in the diagnosis of influenza from Nasopharyngeal swab specimens and should not be used as a sole basis for treatment. Nasal washings and aspirates are unacceptable for Xpert Xpress SARS-CoV-2/FLU/RSV testing.  Fact Sheet for Patients: EntrepreneurPulse.com.au  Fact Sheet for Healthcare Providers: IncredibleEmployment.be  This test is not yet approved or cleared by the [place] FDA and has been authorized for detection  and/or diagnosis of SARS-CoV-2 by FDA under an Emergency Use Authorization (EUA). This EUA will remain in effect (meaning this test can be used) for the duration of the COVID-19 declaration under Section 564(b)(1) of the Act, 21 U.S.C. section 360bbb-3(b)(1), unless the authorization is terminated or revoked.  Performed at [facility], [place] [address], [place], [place] [address]   Culture, blood (routine x 2)     Status: None (Preliminary result)   Collection Time: 08/12/21  9:34 PM   Specimen: BLOOD LEFT HAND  Result Value Ref Range Status   Specimen Description BLOOD LEFT HAND  Final   Special Requests   Final    BOTTLES DRAWN AEROBIC AND ANAEROBIC Blood Culture adequate volume   Culture   Final    NO GROWTH 2 DAYS Performed at [facility], [place] [address] [place], [place] [address]    Report Status PENDING  Incomplete  Respiratory (~20 pathogens) panel by PCR     Status: None   Collection Time: 08/12/21  9:35 PM   Specimen: Nasopharyngeal Swab; Respiratory  Result Value Ref Range Status   Adenovirus NOT DETECTED NOT DETECTED Final   Coronavirus 229E NOT DETECTED NOT DETECTED Final    Comment: (NOTE) The Coronavirus on the Respiratory Panel, DOES NOT test for the novel  Coronavirus (2019 nCoV)    Coronavirus HKU1 NOT DETECTED NOT  DETECTED Final   Coronavirus NL63 NOT DETECTED NOT DETECTED Final   Coronavirus OC43 NOT DETECTED NOT DETECTED Final   Metapneumovirus NOT DETECTED NOT DETECTED Final   Rhinovirus / Enterovirus NOT DETECTED NOT DETECTED Final   Influenza A NOT DETECTED NOT DETECTED Final   Influenza B NOT DETECTED NOT DETECTED Final   Parainfluenza Virus 1 NOT DETECTED NOT DETECTED Final   Parainfluenza Virus 2 NOT DETECTED NOT DETECTED Final   Parainfluenza Virus 3 NOT DETECTED NOT DETECTED Final   Parainfluenza Virus 4 NOT DETECTED NOT DETECTED Final   Respiratory Syncytial Virus NOT DETECTED NOT DETECTED Final   Bordetella pertussis NOT DETECTED  NOT DETECTED Final   Bordetella Parapertussis NOT DETECTED NOT DETECTED Final   Chlamydophila pneumoniae NOT DETECTED NOT DETECTED Final   Mycoplasma pneumoniae NOT DETECTED NOT DETECTED Final    Comment: Performed at [facility], [place] [address] [place], [place] [address]  Culture, blood (routine x 2)     Status: None (Preliminary result)   Collection Time: 08/12/21  9:39 PM   Specimen: BLOOD RIGHT HAND  Result Value Ref Range Status   Specimen Description BLOOD RIGHT HAND  Final   Special Requests   Final    BOTTLES DRAWN AEROBIC AND ANAEROBIC Blood Culture adequate volume   Culture   Final    NO GROWTH 2 DAYS Performed at [facility], [place] [address] [place], [place] [address]    Report Status PENDING  Incomplete  MRSA Next Gen by PCR, Nasal     Status: None   Collection Time: 08/12/21 10:42 PM   Specimen: Nasal Mucosa; Nasal Swab  Result Value Ref Range Status   MRSA by PCR Next Gen NOT DETECTED NOT DETECTED Final    Comment: (NOTE) The GeneXpert MRSA Assay (FDA approved for NASAL specimens only), is one component of a comprehensive MRSA colonization surveillance program. It is not intended to diagnose MRSA infection nor to guide or monitor treatment for MRSA infections. Test performance is not FDA approved in patients less than 51 years old. Performed at [facility], [place] [address] [place], [place] [address]           Radiology Studies: CT Angio Chest PE W and/or Wo Contrast  Result Date: 08/12/2021 CLINICAL DATA:  Shortness of breath. EXAM: CT ANGIOGRAPHY CHEST WITH CONTRAST TECHNIQUE: Multidetector CT imaging of the chest was performed using the standard protocol during bolus administration of intravenous contrast. Multiplanar CT image reconstructions and MIPs were obtained to evaluate the vascular anatomy. CONTRAST:  57mL OMNIPAQUE IOHEXOL 350 MG/ML SOLN COMPARISON:  October 14, 2019. FINDINGS: Cardiovascular: Satisfactory  opacification of the pulmonary arteries to the segmental level. No evidence of pulmonary embolism. Mild cardiomegaly. No pericardial effusion. Atherosclerosis of thoracic aorta is noted without dissection. Mild coronary artery calcifications are noted. Mediastinum/Nodes: No enlarged mediastinal, hilar, or axillary lymph nodes. Thyroid gland, trachea, and esophagus demonstrate no significant findings. Lungs/Pleura: No pneumothorax or pleural effusion is noted. There is interval development of multiple patchy airspace opacities in both lungs concerning for multifocal pneumonia. Upper Abdomen: No acute abnormality. Musculoskeletal: No chest wall abnormality. No acute or significant osseous findings. Review of the MIP images confirms the above findings. IMPRESSION: No definite evidence of pulmonary embolus. Interval development of multiple patchy airspace opacities in both lungs concerning for multifocal pneumonia. Mild coronary calcifications are noted. Aortic Atherosclerosis (ICD10-I70.0). Electronically Signed   By: Marijo Conception M.D.   On: 08/12/2021 18:30  Scheduled Meds:  albuterol  2 puff Inhalation Q6H   vitamin C  500 mg Oral Daily   aspirin EC  81 mg Oral Daily   azithromycin  500 mg Oral Daily   enoxaparin (LOVENOX) injection  40 mg Subcutaneous Q24H   gabapentin  400 mg Oral BID   insulin aspart  0-15 Units Subcutaneous TID AC & HS   methylPREDNISolone (SOLU-MEDROL) injection  80 mg Intravenous Q12H   pantoprazole  40 mg Oral Daily   rosuvastatin  20 mg Oral QHS   zinc sulfate  220 mg Oral Daily   Continuous Infusions:  ceFEPime (MAXIPIME) IV 2 g (08/14/21 0824)   vancomycin Stopped (08/14/21 0048)     LOS: 2 days       Oren Binet, MD [facility]   To contact the attending provider between 7A-7P or the covering provider during after hours 7P-7A, please log into the web site [email or web address] and access using universal [place] password for that web site.  If you do not have the password, please call the hospital operator.  08/14/2021, 2:26 PM

## 2021-08-14 NOTE — Progress Notes (Signed)
Inpatient Diabetes Program Recommendations  AACE/ADA: New Consensus Statement on Inpatient Glycemic Control (2015)  Target Ranges:  Prepandial:   less than 140 mg/dL      Peak postprandial:   less than 180 mg/dL (1-2 hours)      Critically ill patients:  140 - 180 mg/dL   Lab Results  Component Value Date   GLUCAP 358 (H) 08/14/2021   HGBA1C 6.3 (H) 08/07/2021    Latest Reference Range & Units 08/13/21 12:41 08/13/21 18:38 08/13/21 21:07 08/14/21 07:53 08/14/21 11:58  Glucose-Capillary 70 - 99 mg/dL 179 (H) 276 (H) 251 (H) 179 (H) 358 (H)   Review of Glycemic Control  Diabetes history: type 2 Outpatient Diabetes medications: none listed Current orders for Inpatient glycemic control: Novolog MODERATE correction scale TID & HS  Inpatient Diabetes Program Recommendations:   Noted that postprandial blood sugars have been greater than 180 mg/dl. Recommend adding Novolog 4 units TID with meals if eating at  least 50% of meal and CBGs continue to be elevated. If blood sugars still elevated, may need to increase correction scale to RESISTANT.  Harvel Ricks RN BSN CDE Diabetes Coordinator Pager: [phone]  8am-5pm

## 2021-08-15 LAB — BASIC METABOLIC PANEL
Anion gap: 9 (ref 5–15)
BUN: 25 mg/dL — ABNORMAL HIGH (ref 8–23)
CO2: 20 mmol/L — ABNORMAL LOW (ref 22–32)
Calcium: 7.9 mg/dL — ABNORMAL LOW (ref 8.9–10.3)
Chloride: 102 mmol/L (ref 98–111)
Creatinine, Ser: 1.2 mg/dL (ref 0.61–1.24)
GFR, Estimated: >60 mL/min (ref >60)
Glucose, Bld: 168 mg/dL — ABNORMAL HIGH (ref 70–99)
Potassium: 4.4 mmol/L (ref 3.5–5.1)
Sodium: 131 mmol/L — ABNORMAL LOW (ref 135–145)

## 2021-08-15 LAB — GLUCOSE, CAPILLARY
Glucose-Capillary: 230 mg/dL — ABNORMAL HIGH (ref 70–99)
Glucose-Capillary: 287 mg/dL — ABNORMAL HIGH (ref 70–99)
Glucose-Capillary: 433 mg/dL — ABNORMAL HIGH (ref 70–99)
Glucose-Capillary: 401 mg/dL — ABNORMAL HIGH (ref 70–99)
Glucose-Capillary: 260 mg/dL — ABNORMAL HIGH (ref 70–99)

## 2021-08-15 LAB — CBC
HCT: 33.6 % — ABNORMAL LOW (ref 39.0–52.0)
Hemoglobin: 11.7 g/dL — ABNORMAL LOW (ref 13.0–17.0)
MCH: 28.6 pg (ref 26.0–34.0)
MCHC: 34.8 g/dL (ref 30.0–36.0)
MCV: 82.2 fL (ref 80.0–100.0)
Platelets: 281 10*3/uL (ref 150–400)
RBC: 4.09 MIL/uL — ABNORMAL LOW (ref 4.22–5.81)
RDW: 12.3 % (ref 11.5–15.5)
WBC: 15.6 10*3/uL — ABNORMAL HIGH (ref 4.0–10.5)
nRBC: 0 % (ref 0.0–0.2)

## 2021-08-15 LAB — D-DIMER, QUANTITATIVE: D-Dimer, Quant: 0.81 ug/mL-FEU — ABNORMAL HIGH (ref 0.00–0.50)

## 2021-08-15 LAB — C-REACTIVE PROTEIN: CRP: 5.7 mg/dL — ABNORMAL HIGH (ref <1.0)

## 2021-08-15 MED ORDER — INSULIN ASPART 100 UNIT/ML IJ SOLN
25.0000 [IU] | Freq: Once | INTRAMUSCULAR | Status: AC
  Administered 2021-08-15: 17:00:00 25 [IU] via SUBCUTANEOUS

## 2021-08-15 MED ORDER — METHYLPREDNISOLONE SODIUM SUCC 40 MG IJ SOLR
40.0000 mg | Freq: Two times a day (BID) | INTRAMUSCULAR | Status: DC
Start: 2021-08-15 — End: 2021-08-16
  Administered 2021-08-15 – 2021-08-16 (×2): 40 mg via INTRAVENOUS
  Filled 2021-08-15 (×2): qty 1

## 2021-08-15 MED ORDER — INSULIN GLARGINE-YFGN 100 UNIT/ML ~~LOC~~ SOLN
16.0000 [IU] | Freq: Every day | SUBCUTANEOUS | Status: DC
  Administered 2021-08-16 – 2021-08-18 (×3): 16 [IU] via SUBCUTANEOUS
  Filled 2021-08-15 (×3): qty 0.16

## 2021-08-15 NOTE — Progress Notes (Signed)
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  Subjective: Continues to feel better.  Some cough-still with some shortness of breath with ambulation.  Objective: Vitals: Blood pressure 127/73, pulse 62, temperature 97.8 F (36.6 C), temperature source Oral, resp. rate (!) 23, height 5\' 7"  (1.702 m), weight 85.7 kg, SpO2 92 %.   Physical exam: Gen Exam:Alert awake-not in any distress HEENT:atraumatic, normocephalic Chest: B/L clear to auscultation anteriorly CVS:S1S2 regular Abdomen:soft non tender, non distended Extremities:no edema Neurology: Non focal Skin: no rash    Recent Labs    08/12/21 2136 08/13/21 0419 08/15/21 0051  DDIMER  --  1.22* 0.81*  CRP 12.4* 13.1* 5.7*     Assessment & Plan: Acute hypoxic respiratory failure due to multifocal pneumonia in a immunocompromised patient with recent COVID-19 infection: Likely etiology due to ongoing COVID-19 pneumonitis and superimposed bacterial pneumonia.  Hypoxia gradually improving-with the use of steroids and empiric IV antibiotics.  Blood cultures continue to be negative.  CRP downtrending.  Decrease Solu-Medrol today-continue with IV cefepime-continue to mobilize-we will need to be assessed for home O2 requirement prior to discharge.   History of ILD/microscopic polyangiitis: On Rituxan every 6 months (last dose June 2022)-we will schedule to get a injection earlier this month-however has now been postponed  due to COVID-19 diagnoses.  CKD stage IIIa: Creatinine at baseline-watch closely.  Hyponatremia: Probably due to dehydration-improved with gentle hydration.  CAD: No anginal symptoms-continue aspirin/statin.  HLD: Continue statin  DM-2 (A1c 6.3 on 12/14) with uncontrolled hyperglycemia due to steroids: CBGs uncontrolled-increase Semglee to 16 units, continue 4 days of NovoLog with meals and SSI.  Since steroid dosage has been reduced today-anticipate requiring less insulin over the next few days.    Recent Labs    08/14/21 1556 08/14/21 2036 08/15/21 0759  GLUCAP 312* 315* 230*     Peripheral neuropathy: Likely due to DM-continue Neurontin  HLD: Continue statin  GERD: Continue PPI   DVT prophylaxis: Lovenox Code Status: Full Family Communication: wife at bedside Disposition:   Status is: Inpatient  Remains inpatient appropriate because: IV ABX and steroids-if clinical improvement continues-Home in the next few days.    Consultants:  none   Data Reviewed: I have personally reviewed following labs and imaging studies  CBC: Recent Labs  Lab 08/12/21 1110 08/13/21 0419 08/14/21 0101 08/15/21 0051  WBC 9.7 5.7 15.2* 15.6*  NEUTROABS 7.5 5.1  --   --   HGB 15.1 13.3 13.1 11.7*  HCT 44.2 40.7 36.6* 33.6*  MCV 84.7 85.7 81.5 82.2  PLT 256 190 252 281     Basic Metabolic Panel: Recent Labs  Lab 08/12/21 1110 08/13/21 0419 08/14/21 0101 08/15/21 0051  NA 127* 130* 130* 131*  K 4.1 4.4 4.0 4.4  CL 94* 101 103 102  CO2 22 20* 18* 20*  GLUCOSE 144* 202* 148* 168*  BUN 14 16 20  25*  CREATININE 1.35* 1.11 1.04 1.20  CALCIUM 8.2* 7.7* 7.9* 7.9*  MG  --  1.9  --   --  GFR: Estimated Creatinine Clearance: 48.8 mL/min (by C-G formula based on SCr of 1.2 mg/dL).  Liver Function Tests: Recent Labs  Lab 08/12/21 1110 08/13/21 0419  AST 34 28  ALT 22 20  ALKPHOS 114 105  BILITOT 1.0 1.2  PROT 6.1* 5.1*  ALBUMIN 3.1* 2.5*     CBG: Recent Labs   Lab 08/14/21 0753 08/14/21 1158 08/14/21 1556 08/14/21 2036 08/15/21 0759  GLUCAP 179* 358* 312* 315* 230*      Recent Results (from the past 240 hour(s))  Resp Panel by RT-PCR (Flu A&B, Covid) Nasopharyngeal Swab     Status: Abnormal   Collection Time: 08/12/21 11:11 AM   Specimen: Nasopharyngeal Swab; Nasopharyngeal(NP) swabs in vial transport medium  Result Value Ref Range Status   SARS Coronavirus 2 by RT PCR POSITIVE (A) NEGATIVE Final    Comment: (NOTE) SARS-CoV-2 target nucleic acids are DETECTED.  The SARS-CoV-2 RNA is generally detectable in upper respiratory specimens during the acute phase of infection. Positive results are indicative of the presence of the identified virus, but do not rule out bacterial infection or co-infection with other pathogens not detected by the test. Clinical correlation with patient history and other diagnostic information is necessary to determine patient infection status. The expected result is Negative.  Fact Sheet for Patients: EntrepreneurPulse.com.au  Fact Sheet for Healthcare Providers: IncredibleEmployment.be  This test is not yet approved or cleared by the [place] FDA and  has been authorized for detection and/or diagnosis of SARS-CoV-2 by FDA under an Emergency Use Authorization (EUA).  This EUA will remain in effect (meaning this test can be used) for the duration of  the COVID-19 declaration under Section 564(b)(1) of the A ct, 21 U.S.C. section 360bbb-3(b)(1), unless the authorization is terminated or revoked sooner.     Influenza A by PCR NEGATIVE NEGATIVE Final   Influenza B by PCR NEGATIVE NEGATIVE Final    Comment: (NOTE) The Xpert Xpress SARS-CoV-2/FLU/RSV plus assay is intended as an aid in the diagnosis of influenza from Nasopharyngeal swab specimens and should not be used as a sole basis for treatment. Nasal washings and aspirates are unacceptable for Xpert Xpress  SARS-CoV-2/FLU/RSV testing.  Fact Sheet for Patients: EntrepreneurPulse.com.au  Fact Sheet for Healthcare Providers: IncredibleEmployment.be  This test is not yet approved or cleared by the [place] FDA and has been authorized for detection and/or diagnosis of SARS-CoV-2 by FDA under an Emergency Use Authorization (EUA). This EUA will remain in effect (meaning this test can be used) for the duration of the COVID-19 declaration under Section 564(b)(1) of the Act, 21 U.S.C. section 360bbb-3(b)(1), unless the authorization is terminated or revoked.  Performed at [facility], [place] [address], [place], [place] [address]   Culture, blood (routine x 2)     Status: None (Preliminary result)   Collection Time: 08/12/21  9:34 PM   Specimen: BLOOD LEFT HAND  Result Value Ref Range Status   Specimen Description BLOOD LEFT HAND  Final   Special Requests   Final    BOTTLES DRAWN AEROBIC AND ANAEROBIC Blood Culture adequate volume   Culture   Final    NO GROWTH 3 DAYS Performed at [facility], [place] [address], [place], [place] [address]    Report Status PENDING  Incomplete  Respiratory (~20 pathogens) panel by PCR     Status: None   Collection Time: 08/12/21  9:35 PM   Specimen: Nasopharyngeal Swab; Respiratory  Result Value Ref Range Status  Adenovirus NOT DETECTED NOT DETECTED Final   Coronavirus 229E NOT DETECTED NOT DETECTED Final    Comment: (NOTE) The Coronavirus on the Respiratory Panel, DOES NOT test for the novel  Coronavirus (2019 nCoV)    Coronavirus HKU1 NOT DETECTED NOT DETECTED Final   Coronavirus NL63 NOT DETECTED NOT DETECTED Final   Coronavirus OC43 NOT DETECTED NOT DETECTED Final   Metapneumovirus NOT DETECTED NOT DETECTED Final   Rhinovirus / Enterovirus NOT DETECTED NOT DETECTED Final   Influenza A NOT DETECTED NOT DETECTED Final   Influenza B NOT DETECTED NOT DETECTED Final   Parainfluenza Virus 1 NOT DETECTED  NOT DETECTED Final   Parainfluenza Virus 2 NOT DETECTED NOT DETECTED Final   Parainfluenza Virus 3 NOT DETECTED NOT DETECTED Final   Parainfluenza Virus 4 NOT DETECTED NOT DETECTED Final   Respiratory Syncytial Virus NOT DETECTED NOT DETECTED Final   Bordetella pertussis NOT DETECTED NOT DETECTED Final   Bordetella Parapertussis NOT DETECTED NOT DETECTED Final   Chlamydophila pneumoniae NOT DETECTED NOT DETECTED Final   Mycoplasma pneumoniae NOT DETECTED NOT DETECTED Final    Comment: Performed at [facility], [place] [address], [place], [place] [address]  Culture, blood (routine x 2)     Status: None (Preliminary result)   Collection Time: 08/12/21  9:39 PM   Specimen: BLOOD RIGHT HAND  Result Value Ref Range Status   Specimen Description BLOOD RIGHT HAND  Final   Special Requests   Final    BOTTLES DRAWN AEROBIC AND ANAEROBIC Blood Culture adequate volume   Culture   Final    NO GROWTH 3 DAYS Performed at [facility], [place] [address], [place], [place] [address]    Report Status PENDING  Incomplete  MRSA Next Gen by PCR, Nasal     Status: None   Collection Time: 08/12/21 10:42 PM   Specimen: Nasal Mucosa; Nasal Swab  Result Value Ref Range Status   MRSA by PCR Next Gen NOT DETECTED NOT DETECTED Final    Comment: (NOTE) The GeneXpert MRSA Assay (FDA approved for NASAL specimens only), is one component of a comprehensive MRSA colonization surveillance program. It is not intended to diagnose MRSA infection nor to guide or monitor treatment for MRSA infections. Test performance is not FDA approved in patients less than 62 years old. Performed at [facility], [place] [address] [place], [place] [address]           Radiology Studies: No results found.      Scheduled Meds:  albuterol  2 puff Inhalation Q6H   vitamin C  500 mg Oral Daily   aspirin EC  81 mg Oral Daily   azithromycin  500 mg Oral Daily   enoxaparin (LOVENOX) injection  40 mg  Subcutaneous Q24H   gabapentin  400 mg Oral BID   insulin aspart  0-15 Units Subcutaneous TID AC & HS   insulin aspart  4 Units Subcutaneous TID WC   insulin glargine-yfgn  12 Units Subcutaneous Daily   methylPREDNISolone (SOLU-MEDROL) injection  80 mg Intravenous Q12H   pantoprazole  40 mg Oral Daily   rosuvastatin  20 mg Oral QHS   zinc sulfate  220 mg Oral Daily   Continuous Infusions:  ceFEPime (MAXIPIME) IV 2 g (08/15/21 0905)     LOS: 3 days       Oren Binet, MD [facility]   To contact the attending provider between 7A-7P or the covering provider during after hours 7P-7A,  please log into the web site [email or web address] and access using universal [place] password for that web site. If you do not have the password, please call the hospital operator.  08/15/2021, 12:00 PM

## 2021-08-15 NOTE — Progress Notes (Signed)
Physical Therapy Treatment Patient Details Name: Kerry Matthews MRN: [id number] DOB: April 20, 1936 Today's Date: 08/15/2021   History of Present Illness 85 y.o. M admitted 08/12/2021 with multifocal PNA. COVID-19 (+) 12/5. PMHx: ILD, pulmonary fibrosis, HTN, HLD, DM2, diabetic polyneuropathy,coronary artery disease, chronic LBP.    PT Comments    Pt very pleasant with wife present during session. Pt on RA at rest 92% on arrival but with limited mobility of sitting on EOB desaturation to 87% with 3L applied and pt able to maintain 92% on 3L for 90' before desaturation and need for 6L and increased recovery time. Pt able to perform seated HEP on 4L at 90% with rest between and cues for breathing technique. Pt educated for activity modification and need for seated rest after 41' with o2 to maintain sats as well as use of home pulse ox. Pt demonstrated IS use achieving 1000cc. Will continue to follow. Pt on 3.5 L at 90% end of session.     Recommendations for follow up therapy are one component of a multi-disciplinary discharge planning process, led by the attending physician.  Recommendations may be updated based on patient status, additional functional criteria and insurance authorization.  Follow Up Recommendations  No PT follow up     Assistance Recommended at Discharge PRN  Equipment Recommendations  BSC/3in1    Recommendations for Other Services       Precautions / Restrictions Precautions Precautions: Other (comment) Precaution Comments: watch O2     Mobility  Bed Mobility Overal bed mobility: Modified Independent             General bed mobility comments: supine with bed flat to rise    Transfers Overall transfer level: Modified independent                      Ambulation/Gait Ambulation/Gait assistance: Min guard Gait Distance (Feet): 180 Feet Assistive device: None Gait Pattern/deviations: Step-through pattern;Decreased stride length;Trunk  flexed;Shuffle   Gait velocity interpretation: 1.31 - 2.62 ft/sec, indicative of limited community ambulator   General Gait Details: pt with initial gait on 3L able to maintain 92% however at 90' rapid drop to 87% with increase to 6L, 1 min standing rest to recover to 88% and maintain for return 90' on 6L with further drop to 86% at rest requiring 5L and 2 min seated rest to recover to 90%. Pt with tendency to speed up as he gets near surface to rest   Stairs             Wheelchair Mobility    Modified Rankin (Stroke Patients Only)       Balance Overall balance assessment: Mild deficits observed, not formally tested                                          Cognition Arousal/Alertness: Awake/alert Behavior During Therapy: WFL for tasks assessed/performed Overall Cognitive Status: Within Functional Limits for tasks assessed                                          Exercises General Exercises - Lower Extremity Long Arc Quad: AROM;Both;Seated;20 reps Hip Flexion/Marching: Both;Seated;AROM;20 reps    General Comments        Pertinent Vitals/Pain Pain Assessment: No/denies pain  Home Living                          Prior Function            PT Goals (current goals can now be found in the care plan section) Progress towards PT goals: Progressing toward goals    Frequency    Min 3X/week      PT Plan Current plan remains appropriate    Co-evaluation              AM-PAC PT "6 Clicks" Mobility   Outcome Measure  Help needed turning from your back to your side while in a flat bed without using bedrails?: None Help needed moving from lying on your back to sitting on the side of a flat bed without using bedrails?: None Help needed moving to and from a bed to a chair (including a wheelchair)?: A Little Help needed standing up from a chair using your arms (e.g., wheelchair or bedside chair)?: A Little Help  needed to walk in hospital room?: A Little Help needed climbing 3-5 steps with a railing? : A Little 6 Click Score: 20    End of Session Equipment Utilized During Treatment: Oxygen Activity Tolerance: Patient tolerated treatment well Patient left: in chair;with call bell/phone within reach;with family/visitor present Nurse Communication: Mobility status PT Visit Diagnosis: Difficulty in walking, not elsewhere classified (R26.2);Other abnormalities of gait and mobility (R26.89)     Time: 8184-0375 PT Time Calculation (min) (ACUTE ONLY): 31 min  Charges:  $Gait Training: 8-22 mins $Therapeutic Activity: 8-22 mins                     Hussein Macdougal P, PT Acute Rehabilitation Services Pager: [phone] Office: [place] 08/15/2021, 12:28 PM

## 2021-08-15 NOTE — TOC Initial Note (Signed)
Transition of Care [facility]) - Initial/Assessment Note    Patient Details  Name: Kerry Matthews MRN: [id number] Date of Birth: 28-Jan-1936  Transition of Care [facility]) CM/SW Contact:    Cyndi Bender, RN Phone Number: 08/15/2021, 12:36 PM  Clinical Narrative:                 Spoke to patient about transition needs. Patient doesn't want Home Health. Patient states he has walker, 3&1, cane. He is agreeable to use in house provider for oxygen needs if needed at discharge.  TOC will continue to follow for home oxygen needs  Expected Discharge Plan: Home/Self Care Barriers to Discharge: Continued Medical Work up   Patient Goals and CMS Choice Patient states their goals for this hospitalization and ongoing recovery are:: return home      Expected Discharge Plan and Services Expected Discharge Plan: Home/Self Care     Post Acute Care Choice: Durable Medical Equipment Living arrangements for the past 2 months: Single Family Home                                      Prior Living Arrangements/Services Living arrangements for the past 2 months: Single Family Home Lives with:: Spouse Patient language and need for interpreter reviewed:: Yes Do you feel safe going back to the place where you live?: Yes      Need for Family Participation in Patient Care: Yes (Comment) Care giver support system in place?: Yes (comment) Current home services: DME (cane walker, BSC) Criminal Activity/Legal Involvement Pertinent to Current Situation/Hospitalization: No - Comment as needed  Activities of Daily Living Home Assistive Devices/Equipment: None ADL Screening (condition at time of admission) Patient's cognitive ability adequate to safely complete daily activities?: Yes Is the patient deaf or have difficulty hearing?: No Does the patient have difficulty seeing, even when wearing glasses/contacts?: No Does the patient have difficulty concentrating, remembering, or making decisions?:  No Patient able to express need for assistance with ADLs?: Yes Does the patient have difficulty dressing or bathing?: No Independently performs ADLs?: Yes (appropriate for developmental age) Does the patient have difficulty walking or climbing stairs?: Yes Weakness of Legs: Both Weakness of Arms/Hands: None  Permission Sought/Granted                  Emotional Assessment   Attitude/Demeanor/Rapport: Engaged Affect (typically observed): Accepting Orientation: : Oriented to Self, Oriented to Place, Oriented to  Time, Oriented to Situation Alcohol / Substance Use: Not Applicable Psych Involvement: No (comment)  Admission diagnosis:  Pneumonia of both lungs due to infectious organism [J18.9] Pneumonia of both lower lobes due to infectious organism [J18.9] Multifocal pneumonia [J18.9] Patient Active Problem List   Diagnosis Date Noted   Pneumonia of both lungs due to infectious organism 08/12/2021   Respiratory disorder concurrent with and due to microscopic polyangiitis ([place]) 08/12/2021   Hyponatremia 08/12/2021   Mixed diabetic hyperlipidemia associated with type 2 diabetes mellitus (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) 03/29/2021   Microscopic polyangiitis ([place]) 03/29/2021   Rheumatoid factor positive 03/29/2021   Vasculitis ([place]) 03/29/2021   Status post  total left knee replacement 12/17/2020   Hematuria 09/07/2020   Proteinuria 09/07/2020   Renal insufficiency 09/06/2020   Coronary artery disease    GAD (generalized anxiety disorder)    Osteoarthritis    Other emphysema ([place])    Primary insomnia    Skin cancer    UC  (ulcerative colitis) ([place])    UTI (urinary tract infection)    Medicare annual wellness visit, subsequent 08/09/2020   Leukocytosis 03/08/2020   Peripheral polyneuropathy 01/30/2020   Body mass index (BMI) 29.0-29.9, adult 01/05/2020   Acute pain of left knee 11/30/2019   Alpha-1-antitrypsin deficiency carrier 05/04/2019   Sepsis ([place]) 04/19/2019   Elevated rheumatoid factor 04/12/2019   Interstitial pulmonary disease ([place]) 04/12/2019   Abnormal ANCA test 04/12/2019   Abnormal findings on diagnostic imaging of lung 04/12/2019   Polymyositis ([place])    Low vitamin B12 level 04/03/2019   Abnormal CT of the chest    Hypertensive heart disease without heart failure 05/03/2018   Mixed hyperlipidemia 05/03/2018   Coronary artery disease involving native coronary artery of native heart without angina pectoris 05/03/2018   GERD without esophagitis 05/03/2018   Trigger finger 05/03/2018   Bradycardia 05/03/2018   CKD (chronic kidney disease) 05/03/2018   Insomnia 05/03/2018   PCP:  [name], MD Pharmacy:   [facility] [address] [place] [address] Phone: [phone] Fax: [phone]     Social Determinants of Health (SDOH) Interventions    Readmission Risk Interventions Readmission Risk Prevention Plan 07/09/2020  Post Dischage Appt Complete  Medication Screening Complete  Transportation Screening Complete  Some recent data might be hidden

## 2021-08-15 NOTE — Progress Notes (Signed)
Pharmacy Antibiotic Note  Kerry Matthews is a 85 y.o. male admitted on 08/12/2021 with pneumonia.  Pharmacy has been consulted for cefepime dosing.  SCr 1.2 - at baseline WBC up slightly 15.6; LA normal; CRP trending down Afebrile  Plan: Azithromycin per MD Cefepime 2g IV every 12 hours Monitor clinical course, renal function, WBC F/u on cultures and de-escalate as able   Height: 5\' 7"  (170.2 cm) Weight: 85.7 kg (189 lb) IBW/kg (Calculated) : 66.1  Temp (24hrs), Avg:98.2 F (36.8 C), Min:97.8 F (36.6 C), Max:98.5 F (36.9 C)  Recent Labs  Lab 08/12/21 1110 08/12/21 1627 08/13/21 0419 08/14/21 0101 08/15/21 0051  WBC 9.7  --  5.7 15.2* 15.6*  CREATININE 1.35*  --  1.11 1.04 1.20  LATICACIDVEN  --  1.7 1.3  --   --      Estimated Creatinine Clearance: 48.8 mL/min (by C-G formula based on SCr of 1.2 mg/dL).    Allergies  Allergen Reactions   Ace Inhibitors Other (See Comments)    = Slow heart rate   Hydrocodone Nausea And Vomiting   Hydrocodone-Acetaminophen Nausea And Vomiting   Nsaids Other (See Comments)    Kidney issues   Sulfamethoxazole Nausea And Vomiting   Sulfamethoxazole-Trimethoprim Nausea And Vomiting   Trimethoprim Other (See Comments)    Reaction not recalled    Antimicrobials this admission: azithromycin 12/19 >>  cefepime 12/19 >>  vancomycin 12/19 >> 12/21  Microbiology results: 12/19 BCx: ngtd 12/19 MRSA: negative 12/19 COVID +

## 2021-08-15 NOTE — Progress Notes (Signed)
SATURATION QUALIFICATIONS: (This note is used to comply with regulatory documentation for home oxygen)  Patient Saturations on Room Air at Rest = 91%  Patient Saturations on 3L while Ambulating = 92%  Please briefly explain why patient needs home oxygen: pt with desaturation on RA with any transitional movement to 87% and requires supplemental O2 3-6L for all activity and recovery. [place] Pager: [phone] Office: [phone]

## 2021-08-16 ENCOUNTER — Inpatient Hospital Stay (HOSPITAL_COMMUNITY): Payer: Medicare HMO

## 2021-08-16 DIAGNOSIS — U071 COVID-19: Secondary | ICD-10-CM | POA: Diagnosis not present

## 2021-08-16 DIAGNOSIS — J189 Pneumonia, unspecified organism: Secondary | ICD-10-CM | POA: Diagnosis not present

## 2021-08-16 DIAGNOSIS — E1142 Type 2 diabetes mellitus with diabetic polyneuropathy: Secondary | ICD-10-CM | POA: Diagnosis not present

## 2021-08-16 DIAGNOSIS — N1831 Chronic kidney disease, stage 3a: Secondary | ICD-10-CM | POA: Diagnosis not present

## 2021-08-16 LAB — GLUCOSE, CAPILLARY
Glucose-Capillary: 311 mg/dL — ABNORMAL HIGH (ref 70–99)
Glucose-Capillary: 276 mg/dL — ABNORMAL HIGH (ref 70–99)
Glucose-Capillary: 385 mg/dL — ABNORMAL HIGH (ref 70–99)
Glucose-Capillary: 236 mg/dL — ABNORMAL HIGH (ref 70–99)

## 2021-08-16 MED ORDER — IPRATROPIUM-ALBUTEROL 0.5-2.5 (3) MG/3ML IN SOLN
3.0000 mL | Freq: Three times a day (TID) | RESPIRATORY_TRACT | Status: DC
Start: 2021-08-16 — End: 2021-08-17
  Administered 2021-08-16: 20:00:00 3 mL via RESPIRATORY_TRACT
  Filled 2021-08-16: qty 3

## 2021-08-16 MED ORDER — HYDROCOD POLST-CPM POLST ER 10-8 MG/5ML PO SUER
5.0000 mL | Freq: Two times a day (BID) | ORAL | Status: DC | PRN
  Administered 2021-08-16 – 2021-08-24 (×8): 5 mL via ORAL
  Filled 2021-08-16 (×8): qty 5

## 2021-08-16 MED ORDER — BENZONATATE 100 MG PO CAPS
200.0000 mg | ORAL_CAPSULE | Freq: Three times a day (TID) | ORAL | Status: DC | PRN
  Administered 2021-08-16: 11:00:00 200 mg via ORAL
  Filled 2021-08-16: qty 2

## 2021-08-16 MED ORDER — GUAIFENESIN ER 600 MG PO TB12
600.0000 mg | ORAL_TABLET | Freq: Two times a day (BID) | ORAL | Status: DC
  Administered 2021-08-16 – 2021-08-25 (×19): 600 mg via ORAL
  Filled 2021-08-16 (×19): qty 1

## 2021-08-16 MED ORDER — ALBUTEROL SULFATE (2.5 MG/3ML) 0.083% IN NEBU
2.5000 mg | INHALATION_SOLUTION | RESPIRATORY_TRACT | Status: DC | PRN
  Administered 2021-08-16 – 2021-08-17 (×2): 2.5 mg via RESPIRATORY_TRACT
  Filled 2021-08-16 (×2): qty 3

## 2021-08-16 MED ORDER — IPRATROPIUM-ALBUTEROL 0.5-2.5 (3) MG/3ML IN SOLN: 3.0000 mL | Freq: Three times a day (TID) | RESPIRATORY_TRACT | Status: DC

## 2021-08-16 MED ORDER — IPRATROPIUM-ALBUTEROL 0.5-2.5 (3) MG/3ML IN SOLN
3.0000 mL | Freq: Three times a day (TID) | RESPIRATORY_TRACT | Status: DC
Start: 2021-08-16 — End: 2021-08-16
  Administered 2021-08-16: 14:00:00 3 mL via RESPIRATORY_TRACT
  Filled 2021-08-16: qty 3

## 2021-08-16 MED ORDER — PREDNISONE 20 MG PO TABS
40.0000 mg | ORAL_TABLET | Freq: Every day | ORAL | Status: DC
  Administered 2021-08-16 – 2021-08-17 (×2): 40 mg via ORAL
  Filled 2021-08-16 (×2): qty 2

## 2021-08-16 MED ORDER — BENZONATATE 100 MG PO CAPS
200.0000 mg | ORAL_CAPSULE | Freq: Three times a day (TID) | ORAL | Status: DC
  Administered 2021-08-16 – 2021-08-25 (×27): 200 mg via ORAL
  Filled 2021-08-16 (×27): qty 2

## 2021-08-16 NOTE — Progress Notes (Signed)
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  Subjective: Had a rough night-with multiple bouts of coughing and wheezing.  Appears comfortable today.  On 2-3 L of oxygen this morning.  Objective: Vitals: Blood pressure 112/60, pulse 85, temperature 97.9 F (36.6 C), temperature source Oral, resp. rate 20, height 5\' 7"  (1.702 m), weight 85.7 kg, SpO2 90 %.   Physical exam: Gen Exam:Alert awake-not in any distress HEENT:atraumatic, normocephalic Chest: Bibasilar rales. CVS:S1S2 regular Abdomen:soft non tender, non distended Extremities:no edema Neurology: Non focal Skin: no rash    Recent Labs    08/15/21 0051  DDIMER 0.81*  CRP 5.7*     Assessment & Plan: Acute hypoxic respiratory failure due to multifocal pneumonia in a immunocompromised patient with recent COVID-19 infection: Likely etiology due to ongoing COVID-19 pneumonitis and superimposed bacterial pneumonia.  Hypoxia gradually improving-continues to have some coughing spells at times-stable on just 2-3 L of oxygen this morning.  CRP downtrending.  Blood cultures negative so far.  Steroids have been transitioned to prednisone-continue IV cefepime.  Suspect will require home O2 on discharge.  Repeat chest x-ray today.  If hypoxia worsens-or does not improve with ABX/steroids-we will need to consult PCCM at some point.  History of ILD/microscopic polyangiitis: On Rituxan every 6 months (last dose June 2022)-we  will schedule to get a injection earlier this month-however has now been postponed due to COVID-19 diagnoses.  Leukocytosis: Likely due to steroids-cultures negative so far-continue to monitor.  CKD stage IIIa: Creatinine at baseline-watch closely.  Hyponatremia: Probably due to dehydration-improved with gentle hydration.  CAD: No anginal symptoms-continue aspirin/statin.  HLD: Continue statin  DM-2 (A1c 6.3 on 12/14) with uncontrolled hyperglycemia due to steroids: CBGs remain on the higher side-steroid dosing lately has been dramatically reduced-we will get first dose of Semglee 16 units today-continue NovoLog 4 units of meals and SSI-reassess tomorrow.  Suspect will require less insulin going forward as steroids get tapered down.  Recent Labs    08/15/21 1838 08/15/21 2218 08/16/21 0746  GLUCAP 401* 260* 311*     Peripheral neuropathy: Likely due to DM-continue Neurontin  HLD: Continue statin  GERD: Continue PPI   DVT prophylaxis: Lovenox Code Status: Full Family Communication: wife at bedside Disposition:   Status is: Inpatient  Remains inpatient appropriate because: IV ABX and steroids-if clinical improvement continues-Home in the next few days.    Consultants:  none   Data Reviewed: I have personally reviewed following labs and imaging studies  CBC: Recent Labs  Lab 08/12/21 1110 08/13/21 0419 08/14/21 0101 08/15/21 0051  WBC 9.7 5.7 15.2* 15.6*  NEUTROABS 7.5 5.1  --   --   HGB 15.1 13.3 13.1 11.7*  HCT 44.2 40.7 36.6* 33.6*  MCV 84.7 85.7 81.5 82.2  PLT 256 190 252 281     Basic Metabolic Panel: Recent Labs  Lab 08/12/21 1110 08/13/21 0419 08/14/21 0101 08/15/21 0051  NA 127* 130* 130* 131*  K 4.1 4.4 4.0 4.4  CL 94* 101 103 102  CO2 22 20* 18* 20*  GLUCOSE 144* 202* 148* 168*  BUN 14 16 20  25*  CREATININE 1.35* 1.11 1.04 1.20  CALCIUM 8.2* 7.7* 7.9* 7.9*  MG  --  1.9  --   --      GFR: Estimated Creatinine Clearance: 48.8 mL/min  (by C-G formula based on SCr of 1.2 mg/dL).  Liver Function Tests: Recent Labs  Lab 08/12/21 1110 08/13/21 0419  AST 34 28  ALT 22 20  ALKPHOS 114 105  BILITOT 1.0 1.2  PROT 6.1* 5.1*  ALBUMIN 3.1* 2.5*     CBG: Recent Labs  Lab 08/15/21 1218 08/15/21 1617 08/15/21 1838 08/15/21 2218 08/16/21 0746  GLUCAP 287* 433* 401* 260* 311*      Recent Results (from the past 240 hour(s))  Resp Panel by RT-PCR (Flu A&B, Covid) Nasopharyngeal Swab     Status: Abnormal   Collection Time: 08/12/21 11:11 AM   Specimen: Nasopharyngeal Swab; Nasopharyngeal(NP) swabs in vial transport medium  Result Value Ref Range Status   SARS Coronavirus 2 by RT PCR POSITIVE (A) NEGATIVE Final    Comment: (NOTE) SARS-CoV-2 target nucleic acids are DETECTED.  The SARS-CoV-2 RNA is generally detectable in upper respiratory specimens during the acute phase of infection. Positive results are indicative of the presence of the identified virus, but do not rule out bacterial infection or co-infection with other pathogens not detected by the test. Clinical correlation with patient history and other diagnostic information is necessary to determine patient infection status. The expected result is Negative.  Fact Sheet for Patients: EntrepreneurPulse.com.au  Fact Sheet for Healthcare Providers: IncredibleEmployment.be  This test is not yet approved or cleared by the [place] FDA and  has been authorized for detection and/or diagnosis of SARS-CoV-2 by FDA under an Emergency Use Authorization (EUA).  This EUA will remain in effect (meaning this test can be used) for the duration of  the COVID-19 declaration under Section 564(b)(1) of the A ct, 21 U.S.C. section 360bbb-3(b)(1), unless the authorization is terminated or revoked sooner.     Influenza A by PCR NEGATIVE NEGATIVE Final   Influenza B by PCR NEGATIVE NEGATIVE Final    Comment: (NOTE) The Xpert Xpress  SARS-CoV-2/FLU/RSV plus assay is intended as an aid in the diagnosis of influenza from Nasopharyngeal swab specimens and should not be used as a sole basis for treatment. Nasal washings and aspirates are unacceptable for Xpert Xpress SARS-CoV-2/FLU/RSV testing.  Fact Sheet for Patients: EntrepreneurPulse.com.au  Fact Sheet for Healthcare Providers: IncredibleEmployment.be  This test is not yet approved or cleared by the [place] FDA and has been authorized for detection and/or diagnosis of SARS-CoV-2 by FDA under an Emergency Use Authorization (EUA). This EUA will remain in effect (meaning this test can be used) for the duration of the COVID-19 declaration under Section 564(b)(1) of the Act, 21 U.S.C. section 360bbb-3(b)(1), unless the authorization is terminated or revoked.  Performed at [facility], [place] [address], [place], [place] [address]   Culture, blood (routine x 2)     Status: None (Preliminary result)   Collection Time: 08/12/21  9:34 PM   Specimen: BLOOD LEFT HAND  Result Value Ref Range Status   Specimen Description BLOOD LEFT HAND  Final   Special Requests   Final    BOTTLES DRAWN AEROBIC AND ANAEROBIC Blood Culture adequate volume   Culture   Final    NO GROWTH 4 DAYS Performed at [facility], [place],  [place] [address]    Report Status PENDING  Incomplete  Respiratory (~20 pathogens) panel by PCR     Status: None   Collection Time: 08/12/21  9:35 PM   Specimen: Nasopharyngeal Swab; Respiratory  Result Value Ref Range Status   Adenovirus NOT DETECTED NOT DETECTED Final   Coronavirus 229E NOT DETECTED NOT DETECTED Final    Comment: (NOTE) The Coronavirus on the Respiratory Panel, DOES NOT test for the novel  Coronavirus (2019 nCoV)    Coronavirus HKU1 NOT DETECTED NOT DETECTED Final   Coronavirus NL63 NOT DETECTED NOT DETECTED Final   Coronavirus OC43 NOT DETECTED NOT DETECTED Final    Metapneumovirus NOT DETECTED NOT DETECTED Final   Rhinovirus / Enterovirus NOT DETECTED NOT DETECTED Final   Influenza A NOT DETECTED NOT DETECTED Final   Influenza B NOT DETECTED NOT DETECTED Final   Parainfluenza Virus 1 NOT DETECTED NOT DETECTED Final   Parainfluenza Virus 2 NOT DETECTED NOT DETECTED Final   Parainfluenza Virus 3 NOT DETECTED NOT DETECTED Final   Parainfluenza Virus 4 NOT DETECTED NOT DETECTED Final   Respiratory Syncytial Virus NOT DETECTED NOT DETECTED Final   Bordetella pertussis NOT DETECTED NOT DETECTED Final   Bordetella Parapertussis NOT DETECTED NOT DETECTED Final   Chlamydophila pneumoniae NOT DETECTED NOT DETECTED Final   Mycoplasma pneumoniae NOT DETECTED NOT DETECTED Final    Comment: Performed at [facility], [place]. [address], [place], [place] [address]  Culture, blood (routine x 2)     Status: None (Preliminary result)   Collection Time: 08/12/21  9:39 PM   Specimen: BLOOD RIGHT HAND  Result Value Ref Range Status   Specimen Description BLOOD RIGHT HAND  Final   Special Requests   Final    BOTTLES DRAWN AEROBIC AND ANAEROBIC Blood Culture adequate volume   Culture   Final    NO GROWTH 4 DAYS Performed at [facility], [place] [address] [place], [place] [address]    Report Status PENDING  Incomplete  MRSA Next Gen by PCR, Nasal     Status: None   Collection Time: 08/12/21 10:42 PM   Specimen: Nasal Mucosa; Nasal Swab  Result Value Ref Range Status   MRSA by PCR Next Gen NOT DETECTED NOT DETECTED Final    Comment: (NOTE) The GeneXpert MRSA Assay (FDA approved for NASAL specimens only), is one component of a comprehensive MRSA colonization surveillance program. It is not intended to diagnose MRSA infection nor to guide or monitor treatment for MRSA infections. Test performance is not FDA approved in patients less than 32 years old. Performed at [facility], [place] [address], [place], [place] [address]            Radiology Studies: No results found.      Scheduled Meds:  albuterol  2 puff Inhalation Q6H   vitamin C  500 mg Oral Daily   aspirin EC  81 mg Oral Daily   azithromycin  500 mg Oral Daily   enoxaparin (LOVENOX) injection  40 mg Subcutaneous Q24H   gabapentin  400 mg Oral BID   guaiFENesin  600 mg Oral BID   insulin aspart  0-15 Units Subcutaneous TID AC & HS   insulin aspart  4 Units Subcutaneous TID WC   insulin glargine-yfgn  16 Units Subcutaneous Daily   pantoprazole  40 mg Oral Daily   predniSONE  40 mg Oral Q breakfast   rosuvastatin  20 mg Oral QHS   zinc sulfate  220 mg Oral Daily   Continuous Infusions:  ceFEPime (MAXIPIME) IV 2 g (08/16/21 0853)     LOS: 4 days       Oren Binet, MD [facility]   To contact the attending provider between 7A-7P or the covering provider during after hours 7P-7A, please log into the web site [email or web address] and access using universal [place] password for that web site. If you do not have the password, please call the hospital operator.  08/16/2021, 11:38 AM

## 2021-08-16 NOTE — Progress Notes (Signed)
Inpatient Diabetes Program Recommendations  AACE/ADA: New Consensus Statement on Inpatient Glycemic Control (2015)  Target Ranges:  Prepandial:   less than 140 mg/dL      Peak postprandial:   less than 180 mg/dL (1-2 hours)      Critically ill patients:  140 - 180 mg/dL   Lab Results  Component Value Date   GLUCAP 311 (H) 08/16/2021   HGBA1C 6.3 (H) 08/07/2021    Review of Glycemic Control  Diabetes history: type 2 Outpatient Diabetes medications: none listed Current orders for Inpatient glycemic control: Semglee 16 units daily, Novolog MODERATE correction scale TID & HS scale, Novolog 4 units TID with meals.  Inpatient Diabetes Program Recommendations:   Noted that patient's CBGs have been greater than 300 mg/dl. Noted that Semglee was increased to 16 units daily. Patient has been on steroids. Starting Prednisone today.   Recommend changing Novolog correction scale to RESISTANT if blood sugars continue to be elevated.  Harvel Ricks RN BSN CDE Diabetes Coordinator Pager: [phone]  8am-5pm

## 2021-08-16 NOTE — Progress Notes (Signed)
Occupational Therapy Treatment Patient Details Name: Kerry Matthews MRN: [id number] DOB: 15-Oct-1935 Today's Date: 08/16/2021   History of present illness 85 y.o. M admitted 08/12/2021 with multifocal PNA. COVID-19 (+) 12/5. PMHx: ILD, pulmonary fibrosis, HTN, HLD, DM2, diabetic polyneuropathy,coronary artery disease, chronic LBP.   OT comments  Patient received in recliner and nursing states he has been placed on 2.5 liters of oxygen and saturation was 88-92. Patient performed grooming seated in recliner with no change in O2 sats.  Patient donned shoes and oxygen dropped to 86 and was able to bring back up with verbal cues for breathing technique. Patient performed static standing for 1 minute with O2 dropping to 85 following one minute and returned to sitting.  Patient needed to stand again to use urinal with oxygen dropping again. Patient requires frequent cues for proper breathing technique.  Wife also gives patient cues.  Acute OT to continue to follow.    Recommendations for follow up therapy are one component of a multi-disciplinary discharge planning process, led by the attending physician.  Recommendations may be updated based on patient status, additional functional criteria and insurance authorization.    Follow Up Recommendations  Home health OT    Assistance Recommended at Discharge Set up Supervision/Assistance  Equipment Recommendations  BSC/3in1    Recommendations for Other Services      Precautions / Restrictions Precautions Precautions: Other (comment) (watch O2) Precaution Comments: watch O2       Mobility Bed Mobility Overal bed mobility: Modified Independent             General bed mobility comments: sitting in recliner upon entry    Transfers Overall transfer level: Modified independent                       Balance Overall balance assessment: Mild deficits observed, not formally tested                                          ADL either performed or assessed with clinical judgement   ADL Overall ADL's : Needs assistance/impaired     Grooming: Wash/dry hands;Wash/dry face;Oral care;Set up;Sitting Grooming Details (indicate cue type and reason): performed seated in recliner with O2 at 90 on 2.5 liters             Lower Body Dressing: Minimal assistance;Sitting/lateral leans Lower Body Dressing Details (indicate cue type and reason): donned shoes with shoehorn               General ADL Comments: voided in urinal while standing with min guard and O2 dropped to 84    Extremity/Trunk Assessment              Vision       Perception     Praxis      Cognition Arousal/Alertness: Awake/alert Behavior During Therapy: WFL for tasks assessed/performed Overall Cognitive Status: Within Functional Limits for tasks assessed                                 General Comments: required frequent cues for proper breathing technique while wearing O2          Exercises     Shoulder Instructions       General Comments      Pertinent Vitals/ Pain  Pain Assessment: No/denies pain  Home Living                                          Prior Functioning/Environment              Frequency  Min 2X/week        Progress Toward Goals  OT Goals(current goals can now be found in the care plan section)  Progress towards OT goals: Progressing toward goals  Acute Rehab OT Goals Patient Stated Goal: get better OT Goal Formulation: With patient/family Time For Goal Achievement: 08/28/21 Potential to Achieve Goals: Fair ADL Goals Pt Will Perform Lower Body Bathing: with min guard assist;with adaptive equipment Pt Will Perform Lower Body Dressing: with min guard assist;with adaptive equipment Pt Will Transfer to Toilet: with supervision;ambulating Pt Will Perform Toileting - Clothing Manipulation and hygiene: with modified independence;with adaptive  equipment  Plan Discharge plan remains appropriate    Co-evaluation                 AM-PAC OT "6 Clicks" Daily Activity     Outcome Measure   Help from another person eating meals?: None Help from another person taking care of personal grooming?: A Little Help from another person toileting, which includes using toliet, bedpan, or urinal?: A Little Help from another person bathing (including washing, rinsing, drying)?: A Lot Help from another person to put on and taking off regular upper body clothing?: A Little Help from another person to put on and taking off regular lower body clothing?: A Lot 6 Click Score: 17    End of Session Equipment Utilized During Treatment: Oxygen  OT Visit Diagnosis: Other abnormalities of gait and mobility (R26.89);Unsteadiness on feet (R26.81)   Activity Tolerance Patient limited by fatigue   Patient Left in chair;with call bell/phone within reach;with family/visitor present   Nurse Communication Mobility status        Time: 1135-1200 OT Time Calculation (min): 25 min  Charges: OT General Charges $OT Visit: 1 Visit OT Treatments $Self Care/Home Management : 23-37 mins  Lodema Hong, [place]  Pager [phone] Office [place] 08/16/2021, 12:43 PM

## 2021-08-16 NOTE — Progress Notes (Signed)
Patient's oxygen saturation decreased to 84-85%. Patient was actively coughing and had audible wheezing upon opening door to his room. Oxygen increased from 1 to 3 L [place], oxygen saturation increased from 85 to 88%. Encouraged patient to cough deeply and expel any mucous if able. Albuterol inhaler administered along with robitussin DM. MD notified and nebulizer ordered. RT contacted and in route to administer Nebulizer to patient for relief of wheezing and SOB.

## 2021-08-17 ENCOUNTER — Inpatient Hospital Stay (HOSPITAL_COMMUNITY): Payer: Medicare HMO

## 2021-08-17 DIAGNOSIS — E871 Hypo-osmolality and hyponatremia: Secondary | ICD-10-CM | POA: Diagnosis not present

## 2021-08-17 DIAGNOSIS — J189 Pneumonia, unspecified organism: Secondary | ICD-10-CM | POA: Diagnosis not present

## 2021-08-17 DIAGNOSIS — J9601 Acute respiratory failure with hypoxia: Secondary | ICD-10-CM

## 2021-08-17 DIAGNOSIS — N1831 Chronic kidney disease, stage 3a: Secondary | ICD-10-CM | POA: Diagnosis not present

## 2021-08-17 DIAGNOSIS — E1142 Type 2 diabetes mellitus with diabetic polyneuropathy: Secondary | ICD-10-CM | POA: Diagnosis not present

## 2021-08-17 LAB — D-DIMER, QUANTITATIVE: D-Dimer, Quant: 13.74 ug/mL-FEU — ABNORMAL HIGH (ref 0.00–0.50)

## 2021-08-17 LAB — CBC
HCT: 35 % — ABNORMAL LOW (ref 39.0–52.0)
Hemoglobin: 11.8 g/dL — ABNORMAL LOW (ref 13.0–17.0)
MCH: 28.2 pg (ref 26.0–34.0)
MCHC: 33.7 g/dL (ref 30.0–36.0)
MCV: 83.5 fL (ref 80.0–100.0)
Platelets: 310 10*3/uL (ref 150–400)
RBC: 4.19 MIL/uL — ABNORMAL LOW (ref 4.22–5.81)
RDW: 12.8 % (ref 11.5–15.5)
WBC: 14.7 10*3/uL — ABNORMAL HIGH (ref 4.0–10.5)
nRBC: 0.2 % (ref 0.0–0.2)

## 2021-08-17 LAB — CULTURE, BLOOD (ROUTINE X 2)
Culture: NO GROWTH
Culture: NO GROWTH
Special Requests: ADEQUATE
Special Requests: ADEQUATE

## 2021-08-17 LAB — BASIC METABOLIC PANEL
Anion gap: 7 (ref 5–15)
BUN: 29 mg/dL — ABNORMAL HIGH (ref 8–23)
CO2: 22 mmol/L (ref 22–32)
Calcium: 7.9 mg/dL — ABNORMAL LOW (ref 8.9–10.3)
Chloride: 105 mmol/L (ref 98–111)
Creatinine, Ser: 1.28 mg/dL — ABNORMAL HIGH (ref 0.61–1.24)
GFR, Estimated: 56 mL/min — ABNORMAL LOW (ref >60)
Glucose, Bld: 124 mg/dL — ABNORMAL HIGH (ref 70–99)
Potassium: 4.5 mmol/L (ref 3.5–5.1)
Sodium: 134 mmol/L — ABNORMAL LOW (ref 135–145)

## 2021-08-17 LAB — GLUCOSE, CAPILLARY
Glucose-Capillary: 183 mg/dL — ABNORMAL HIGH (ref 70–99)
Glucose-Capillary: 148 mg/dL — ABNORMAL HIGH (ref 70–99)
Glucose-Capillary: 243 mg/dL — ABNORMAL HIGH (ref 70–99)
Glucose-Capillary: 373 mg/dL — ABNORMAL HIGH (ref 70–99)

## 2021-08-17 LAB — C-REACTIVE PROTEIN: CRP: 2.9 mg/dL — ABNORMAL HIGH (ref <1.0)

## 2021-08-17 LAB — BRAIN NATRIURETIC PEPTIDE: B Natriuretic Peptide: 130.1 pg/mL — ABNORMAL HIGH (ref 0.0–100.0)

## 2021-08-17 LAB — PROCALCITONIN: Procalcitonin: 0.27 ng/mL

## 2021-08-17 MED ORDER — METHYLPREDNISOLONE SODIUM SUCC 125 MG IJ SOLR
90.0000 mg | Freq: Every day | INTRAMUSCULAR | Status: DC
  Administered 2021-08-17 – 2021-08-20 (×4): 90 mg via INTRAVENOUS
  Filled 2021-08-17 (×4): qty 2

## 2021-08-17 MED ORDER — ENOXAPARIN SODIUM 40 MG/0.4ML IJ SOSY
40.0000 mg | PREFILLED_SYRINGE | Freq: Once | INTRAMUSCULAR | Status: AC
  Administered 2021-08-17: 14:00:00 40 mg via SUBCUTANEOUS
  Filled 2021-08-17: qty 0.4

## 2021-08-17 MED ORDER — FUROSEMIDE 10 MG/ML IJ SOLN
40.0000 mg | Freq: Once | INTRAMUSCULAR | Status: AC
  Administered 2021-08-17: 13:00:00 40 mg via INTRAVENOUS
  Filled 2021-08-17: qty 4

## 2021-08-17 MED ORDER — ENOXAPARIN SODIUM 100 MG/ML IJ SOSY
85.0000 mg | PREFILLED_SYRINGE | Freq: Two times a day (BID) | INTRAMUSCULAR | Status: DC
  Administered 2021-08-18 (×2): 85 mg via SUBCUTANEOUS
  Filled 2021-08-17 (×2): qty 1

## 2021-08-17 NOTE — Progress Notes (Signed)
ANTICOAGULATION CONSULT NOTE - Initial Consult  Pharmacy Consult for Lovenox Indication: pulmonary embolus  Allergies  Allergen Reactions   Ace Inhibitors Other (See Comments)    = Slow heart rate   Hydrocodone Nausea And Vomiting   Hydrocodone-Acetaminophen Nausea And Vomiting   Nsaids Other (See Comments)    Kidney issues   Sulfamethoxazole Nausea And Vomiting   Sulfamethoxazole-Trimethoprim Nausea And Vomiting   Trimethoprim Other (See Comments)    Reaction not recalled    Patient Measurements: Height: 5\' 7"  (170.2 cm) Weight: 85.7 kg (189 lb) IBW/kg (Calculated) : 66.1  Vital Signs: Temp: 98.5 F (36.9 C) (12/24 1215) Temp Source: Oral (12/24 1215) BP: 100/59 (12/24 1215) Pulse Rate: 100 (12/24 1215)  Labs: Recent Labs    08/15/21 0051 08/17/21 0204  HGB 11.7* 11.8*  HCT 33.6* 35.0*  PLT 281 310  CREATININE 1.20 1.28*    Estimated Creatinine Clearance: 45.7 mL/min (A) (by C-G formula based on SCr of 1.28 mg/dL (H)).   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  ANCA antibodies- myeloperoxidase ABS-greater than 100, ANCA proteinase 3-less than 3.5 04/02/2019- ANCA titers- p-ANCA +1: 160, C ANCA-less than 1: 20, atypical p-ANCA titer-less than 1: 20   Abnormal CT of the chest    Abnormal findings on diagnostic imaging of lung 04/12/2019   04/01/2019-CT chest with contrast- no acute process in chest abdomen or pelvis, interstitial lung disease suspicious for early or mild UIP, pulmonary artery enlargement suggest PAH    Acute low back pain without sciatica 03/08/2020   Acute low back pain without sciatica 03/08/2020   Acute lower UTI 07/07/2020   Acute pain of left knee 11/30/2019   AKI (acute kidney injury) ([place])    Alpha-1-antitrypsin deficiency carrier 05/04/2019   Anemia    Atherosclerotic heart disease of native coronary artery without angina pectoris 05/03/2018   Body mass index (BMI)  29.0-29.9, adult 01/05/2020   Bradycardia    Burning sensation of feet 01/05/2020   CKD (chronic kidney disease) 05/03/2018   Community acquired pneumonia    Community acquired pneumonia    Coronary artery disease    Cough    Diabetes ([place])    Diabetes mellitus type 2 in obese ([place]) 04/01/2019   Dyslipidemia associated with type 2 diabetes mellitus ([place]) 05/03/2018   Dysuria 09/07/2020   Elevated LFTs 04/01/2019   Elevated rheumatoid factor 04/12/2019   04/03/2019-rheumatoid factor-95.4   Emphysema (subcutaneous) (surgical) resulting from a procedure 05/03/2018   Essential hypertension 05/03/2018   Fatigue 03/29/2021   Fever    GAD (generalized anxiety disorder)    GERD (gastroesophageal reflux disease)    Hematuria 09/07/2020   History of alpha-1-antitrypsin deficiency 04/12/2019   History of kidney stones    Hyperlipidemia    Hypoxemia    Idiopathic pulmonary fibrosis ([place]) 03/29/2021   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) 04/12/2019   04/01/2019-CT chest with contrast- no acute process in chest abdomen or pelvis, interstitial lung disease suspicious for early or mild UIP, pulmonary artery enlargement suggest PAH  04/02/2019-connective tissue work-up: Anti-Jo 1-negative Anti-DNA antibody double-stranded-negative Anti-scleroderma antibody-negative Sjogren's syndrome antibody-negative Sjogren's syndrome antibody-negative CK-31 CCP-6 E   Leukocytosis 03/08/2020   Low vitamin B12 level 04/03/2019   Lower extremity edema    Medicare annual wellness visit, subsequent 08/09/2020   Microscopic polyangiitis ([place]) 03/29/2021   Mixed hyperlipidemia 05/03/2018   Myalgia 04/19/2019  Osteoarthritis    Other emphysema ([place])    Other long term (current) drug therapy 03/29/2021   Paresthesias in left hand 11/30/2019   Peripheral polyneuropathy 01/30/2020   Pneumonia 04/01/2019   Pneumonitis    Pneumonitis    Polymyositis ([place])     Primary insomnia    Proteinuria 09/07/2020   Renal insufficiency 09/06/2020   Renal stones    Rheumatoid factor positive 03/29/2021   Sepsis ([place]) 04/19/2019   Severe sepsis ([place]) 07/07/2020   Skin cancer    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/17/2020   Transaminitis    Trigger finger 05/03/2018   UC (ulcerative colitis) ([place])    Urinary urgency 09/07/2020   UTI (urinary tract infection)    Vasculitis ([place]) 03/29/2021    Medications:  Scheduled:   vitamin C  500 mg Oral Daily   aspirin EC  81 mg Oral Daily   benzonatate  200 mg Oral TID   enoxaparin (LOVENOX) injection  40 mg Subcutaneous Q24H   furosemide  40 mg Intravenous Once   gabapentin  400 mg Oral BID   guaiFENesin  600 mg Oral BID   insulin aspart  0-15 Units Subcutaneous TID AC & HS   insulin aspart  4 Units Subcutaneous TID WC   insulin glargine-yfgn  16 Units Subcutaneous Daily   methylPREDNISolone (SOLU-MEDROL) injection  90 mg Intravenous Daily   rosuvastatin  20 mg Oral QHS   zinc sulfate  220 mg Oral Daily   Infusions:   ceFEPime (MAXIPIME) IV 2 g (08/17/21 0948)    Assessment: Increase in oxygen need from 2L [place] to 15L HFNC and an increase in ddimer from 0.81 to 13.74. Imaging is unclear, however, due to the quick decline and markers indicating his condition is likely PE. Per imaging on 12/19, no definite evidence of PE, 12/23 patchy bilateral infiltrates compatible with PNA and COVID status, and 12/24 no change.  Goal of Therapy:  Anti-Xa level 0.6-1 units/ml 4hrs after LMWH dose given Monitor platelets by anticoagulation protocol: Yes   Plan:  Gave a 1 time dose of 40 mg in addition to dose this morning to equal the therapeutic dose per Dr. Sloan Leiter. Start Lovenox 85 mg [place] q12h tonight. Monitor for signs and symptoms of bleeding. Follow-up imaging results. Follow-up CBC and renal function.   Varney Daily, PharmD PGY1 Pharmacy Resident  Please check AMION for all [facility] pharmacy phone numbers After  10:00 PM call main pharmacy [phone]

## 2021-08-17 NOTE — Progress Notes (Signed)
Bilateral lower extremity venous duplex has been completed. Preliminary results can be found in CV Proc through chart review.   08/17/21 12:52 PM Kerry Matthews RVT

## 2021-08-17 NOTE — Progress Notes (Signed)
RT NOTES: Pt sats on 6lpm mid 80s and patient short of breath. Albuterol administered and placed on salter HFNC. Titrated to 15lpm. Sats 96%. Will continue to monitor. RN at bedside.

## 2021-08-17 NOTE — Progress Notes (Addendum)
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  Subjective: Looks okay-feels essentially unchanged-only gets short of breath/wheezing when he gets coughing spells.  However more hypoxic this morning-requiring initiation of salter high flow.  Objective: Vitals: Blood pressure 121/69, pulse 96, temperature 98.4 F (36.9 C), temperature source Oral, resp. rate (!) 29, height 5\' 7"  (1.702 m), weight 85.7 kg, SpO2 96 %.   Physical exam: Gen Exam:Alert awake-not in any distress HEENT:atraumatic, normocephalic Chest: Bibasilar rales. CVS:S1S2 regular Abdomen:soft non tender, non distended Extremities:no edema Neurology: Non focal Skin: no rash    Recent Labs    08/15/21 0051 08/17/21 0204  DDIMER 0.81*  --   CRP 5.7* 2.9*     Assessment & Plan: Acute hypoxic respiratory failure due to multifocal pneumonia in a immunocompromised patient with recent COVID-19 infection: Likely etiology due to ongoing COVID-19 pneumonitis and superimposed bacterial pneumonia.  Was treated with empiric IV antibiotics/IV steroids-was transitioned to prednisone yesterday-however has now developed worsening hypoxemia.   No volume overload to suggest hypoxia is from pulmonary edema-but will go ahead and give 1 dose of IV Lasix to see if hypoxemia improves.  Has been on prophylactic Lovenox-recent CTA chest was negative for PE-therefore unlikely to be VTE-we will repeat D-dimer and check lower  extremity Dopplers.  Repeat CXR today-check a procalcitonin/D-dimer/Echo as well.   Given very broad differential diagnosis-given immunocompromised state/microscopic polyangiitis-have consulted PCCM for further recommendations.  History of ILD/microscopic polyangiitis: On Rituxan every 6 months (last dose June 2022)-we will schedule to get a injection earlier this month-however has now been postponed due to COVID-19 diagnoses.  Has worsening hypoxemia-timing is curious that it occurred when steroid dosage was de-escalated-have consulted PCCM.  Leukocytosis: Likely due to steroids-cultures negative so far-continue to monitor.  CKD stage IIIa: Creatinine at baseline-watch closely.  Hyponatremia: Probably due to dehydration-improved with gentle hydration.  CAD: No anginal symptoms-continue aspirin/statin.  HLD: Continue statin  DM-2 (A1c 6.3 on 12/14) with uncontrolled hyperglycemia due to steroids: CBGs remain on the higher side-steroid dosing lately has been dramatically reduced-continue Semglee 16 units daily, 4 units of NovoLog with meals-and SSI.   Recent Labs    08/16/21 1615 08/16/21 2100 08/17/21 0803  GLUCAP 385* 236* 183*     Peripheral neuropathy: Likely due to DM-continue Neurontin  HLD: Continue statin  GERD: Continue PPI  DVT prophylaxis: Lovenox Code Status: Full Family Communication: wife at bedside Disposition:   Status is: Inpatient  Remains inpatient appropriate because: IV ABX and steroids-if clinical improvement continues-Home in the next few days.    Consultants:  none   Data Reviewed: I have personally reviewed following labs and imaging studies  CBC: Recent Labs  Lab 08/12/21 1110 08/13/21 0419 08/14/21 0101 08/15/21 0051 08/17/21 0204  WBC 9.7 5.7 15.2* 15.6* 14.7*  NEUTROABS 7.5 5.1  --   --   --   HGB 15.1 13.3 13.1 11.7* 11.8*  HCT 44.2 40.7 36.6* 33.6* 35.0*  MCV 84.7 85.7 81.5 82.2 83.5  PLT 256 190 252  281 310     Basic  Metabolic Panel: Recent Labs  Lab 08/12/21 1110 08/13/21 0419 08/14/21 0101 08/15/21 0051 08/17/21 0204  NA 127* 130* 130* 131* 134*  K 4.1 4.4 4.0 4.4 4.5  CL 94* 101 103 102 105  CO2 22 20* 18* 20* 22  GLUCOSE 144* 202* 148* 168* 124*  BUN 14 16 20  25* 29*  CREATININE 1.35* 1.11 1.04 1.20 1.28*  CALCIUM 8.2* 7.7* 7.9* 7.9* 7.9*  MG  --  1.9  --   --   --      GFR: Estimated Creatinine Clearance: 45.7 mL/min (A) (by C-G formula based on SCr of 1.28 mg/dL (H)).  Liver Function Tests: Recent Labs  Lab 08/12/21 1110 08/13/21 0419  AST 34 28  ALT 22 20  ALKPHOS 114 105  BILITOT 1.0 1.2  PROT 6.1* 5.1*  ALBUMIN 3.1* 2.5*     CBG: Recent Labs  Lab 08/16/21 0746 08/16/21 1206 08/16/21 1615 08/16/21 2100 08/17/21 0803  GLUCAP 311* 276* 385* 236* 183*      Recent Results (from the past 240 hour(s))  Resp Panel by RT-PCR (Flu A&B, Covid) Nasopharyngeal Swab     Status: Abnormal   Collection Time: 08/12/21 11:11 AM   Specimen: Nasopharyngeal Swab; Nasopharyngeal(NP) swabs in vial transport medium  Result Value Ref Range Status   SARS Coronavirus 2 by RT PCR POSITIVE (A) NEGATIVE Final    Comment: (NOTE) SARS-CoV-2 target nucleic acids are DETECTED.  The SARS-CoV-2 RNA is generally detectable in upper respiratory specimens during the acute phase of infection. Positive results are indicative of the presence of the identified virus, but do not rule out bacterial infection or co-infection with other pathogens not detected by the test. Clinical correlation with patient history and other diagnostic information is necessary to determine patient infection status. The expected result is Negative.  Fact Sheet for Patients: EntrepreneurPulse.com.au  Fact Sheet for Healthcare Providers: IncredibleEmployment.be  This test is not yet approved or cleared by the [place] FDA and  has been authorized for detection and/or diagnosis  of SARS-CoV-2 by FDA under an Emergency Use Authorization (EUA).  This EUA will remain in effect (meaning this test can be used) for the duration of  the COVID-19 declaration under Section 564(b)(1) of the A ct, 21 U.S.C. section 360bbb-3(b)(1), unless the authorization is terminated or revoked sooner.     Influenza A by PCR NEGATIVE NEGATIVE Final   Influenza B by PCR NEGATIVE NEGATIVE Final    Comment: (NOTE) The Xpert Xpress SARS-CoV-2/FLU/RSV plus assay is intended as an aid in the diagnosis of influenza from Nasopharyngeal swab specimens and should not be used as a sole basis for treatment. Nasal washings and aspirates are unacceptable for Xpert Xpress SARS-CoV-2/FLU/RSV testing.  Fact Sheet for Patients: EntrepreneurPulse.com.au  Fact Sheet for Healthcare Providers: IncredibleEmployment.be  This test is not yet approved or cleared by the [place] FDA and has been authorized for detection and/or diagnosis of SARS-CoV-2 by FDA under an Emergency Use Authorization (EUA). This EUA will remain in effect (meaning this test can be used) for the duration of the COVID-19 declaration under Section 564(b)(1) of the Act, 21 U.S.C. section 360bbb-3(b)(1), unless the authorization is terminated or revoked.  Performed at [facility], [place] [address] [place], [place] [address]   Culture, blood (routine x 2)     Status: None (Preliminary result)   Collection Time: 08/12/21  9:34 PM   Specimen: BLOOD LEFT HAND  Result Value  Ref Range Status   Specimen Description BLOOD LEFT HAND  Final   Special Requests   Final    BOTTLES DRAWN AEROBIC AND ANAEROBIC Blood Culture adequate volume   Culture   Final    NO GROWTH 4 DAYS Performed at [facility], [address], [place], [place] [address]    Report Status PENDING  Incomplete  Respiratory (~20 pathogens) panel by PCR     Status: None   Collection Time: 08/12/21  9:35 PM   Specimen:  Nasopharyngeal Swab; Respiratory  Result Value Ref Range Status   Adenovirus NOT DETECTED NOT DETECTED Final   Coronavirus 229E NOT DETECTED NOT DETECTED Final    Comment: (NOTE) The Coronavirus on the Respiratory Panel, DOES NOT test for the novel  Coronavirus (2019 nCoV)    Coronavirus HKU1 NOT DETECTED NOT DETECTED Final   Coronavirus NL63 NOT DETECTED NOT DETECTED Final   Coronavirus OC43 NOT DETECTED NOT DETECTED Final   Metapneumovirus NOT DETECTED NOT DETECTED Final   Rhinovirus / Enterovirus NOT DETECTED NOT DETECTED Final   Influenza A NOT DETECTED NOT DETECTED Final   Influenza B NOT DETECTED NOT DETECTED Final   Parainfluenza Virus 1 NOT DETECTED NOT DETECTED Final   Parainfluenza Virus 2 NOT DETECTED NOT DETECTED Final   Parainfluenza Virus 3 NOT DETECTED NOT DETECTED Final   Parainfluenza Virus 4 NOT DETECTED NOT DETECTED Final   Respiratory Syncytial Virus NOT DETECTED NOT DETECTED Final   Bordetella pertussis NOT DETECTED NOT DETECTED Final   Bordetella Parapertussis NOT DETECTED NOT DETECTED Final   Chlamydophila pneumoniae NOT DETECTED NOT DETECTED Final   Mycoplasma pneumoniae NOT DETECTED NOT DETECTED Final    Comment: Performed at [facility], [place]. [address], [place], [place] [address]  Culture, blood (routine x 2)     Status: None (Preliminary result)   Collection Time: 08/12/21  9:39 PM   Specimen: BLOOD RIGHT HAND  Result Value Ref Range Status   Specimen Description BLOOD RIGHT HAND  Final   Special Requests   Final    BOTTLES DRAWN AEROBIC AND ANAEROBIC Blood Culture adequate volume   Culture   Final    NO GROWTH 4 DAYS Performed at [facility], [place] [address], [place], [place] [address]    Report Status PENDING  Incomplete  MRSA Next Gen by PCR, Nasal     Status: None   Collection Time: 08/12/21 10:42 PM   Specimen: Nasal Mucosa; Nasal Swab  Result Value Ref Range Status   MRSA by PCR Next Gen NOT DETECTED NOT DETECTED Final     Comment: (NOTE) The GeneXpert MRSA Assay (FDA approved for NASAL specimens only), is one component of a comprehensive MRSA colonization surveillance program. It is not intended to diagnose MRSA infection nor to guide or monitor treatment for MRSA infections. Test performance is not FDA approved in patients less than 82 years old. Performed at [facility], [place] [address], [place], [place] [address]           Radiology Studies: [facility] Chest Port 1V same Day  Result Date: 08/17/2021 CLINICAL DATA:  Pneumonia EXAM: PORTABLE CHEST 1 VIEW COMPARISON:  08/16/2021 FINDINGS: Patchy bilateral airspace disease again noted, similar to prior study. Heart is borderline in size. No effusions. No acute bony abnormality. IMPRESSION: Patchy bilateral airspace disease compatible with pneumonia. No real change. Electronically Signed   By: Rolm Baptise M.D.   On: 08/17/2021 10:19   DG Chest [facility]  1V same Day  Result Date: 08/16/2021 CLINICAL DATA:  Short of breath EXAM: PORTABLE CHEST 1 VIEW COMPARISON:  Chest x-ray 08/12/2021.  Chest CT 08/12/2021 FINDINGS: Patchy bilateral infiltrates with mild progression. No pleural effusion Heart size enlarged.  No vascular congestion. IMPRESSION: Progression of patchy bilateral infiltrates compatible with pneumonia. Correlate with COVID status. Electronically Signed   By: Franchot Gallo M.D.   On: 08/16/2021 14:51        Scheduled Meds:  vitamin C  500 mg Oral Daily   aspirin EC  81 mg Oral Daily   benzonatate  200 mg Oral TID   enoxaparin (LOVENOX) injection  40 mg Subcutaneous Q24H   gabapentin  400 mg Oral BID   guaiFENesin  600 mg Oral BID   insulin aspart  0-15 Units Subcutaneous TID AC & HS   insulin aspart  4 Units Subcutaneous TID WC   insulin glargine-yfgn  16 Units Subcutaneous Daily   pantoprazole  40 mg Oral Daily   predniSONE  40 mg Oral Q breakfast   rosuvastatin  20 mg Oral QHS   zinc sulfate  220 mg Oral Daily    Continuous Infusions:  ceFEPime (MAXIPIME) IV 2 g (08/17/21 0948)     LOS: 5 days       Oren Binet, MD [facility]   To contact the attending provider between 7A-7P or the covering provider during after hours 7P-7A, please log into the web site [email or web address] and access using universal [place] password for that web site. If you do not have the password, please call the hospital operator.  08/17/2021, 10:26 AM

## 2021-08-17 NOTE — Consult Note (Addendum)
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  He has been treated for bacterial pneumonia with cefepime/azithromycin since 12/19, . I/O net positive 3.5L but may be inaccurate. Only 1 charted weight.  Rising O2 requirement may coincide with decreasing steroid dose. CXR today seems to show more extensive alveolar opacities relative to 12/19 CXR.  He was last seen in clinic with Dr. Vaughan Browner 10/20/19.  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 rate (!) 29, height 5\' 7"  (1.702 m), weight 85.7 kg, SpO2 96 %.       No intake or output data in the 24 hours ending 08/17/21 1026 Filed Weights   08/13/21 0700  Weight: 85.7 kg    Examination: General appearance: 85 y.o., male, NAD, conversant  Eyes: anicteric sclerae; PERRL, tracking appropriately HENT: NCAT; MMM Neck: Trachea midline; no lymphadenopathy, no JVD Lungs: equal chest rise, RR 24-28 with mildly increased respiratory effort CV: tachy RR, no murmur  Abdomen: Soft, non-tender; non-distended, BS present  Extremities: No peripheral edema, warm Skin: Normal turgor and texture; no rash Neuro:  Alert and oriented to person and place, no focal deficit   S Cr creeping up to 1.28  UA 08/12/21 bland  CTA Chest 08/12/21 reviewed by me remarkable for extensive interval ggo with perivascular, peripheral and dependent predominance  TTE 2020 indeterminate for diastolic dysfunction   BNP not elevated  Resolved Hospital Problem list     Assessment & Plan:   # Acute hypoxic respiratory failure # Widespread ggo # Covid-19 pneumonia Could have ongoing infection-related pneumonitis given ritux exposure and positive PCR, organizing pneumonia from covid-19 or underlying ILD (although extrapulmonary symptoms have been quiescent) - has heightened susceptibility to either with underlying ILD. Worsening with reduction in steroid dose also makes me wonder about DAH given blood tinged sputum, perivascular distribution of ggo, and some drop in Hb. While CTA negative 12/19 and US DVT neg, given abrupt rise in O2 requirement that probably isn't fully explained by worsening airspace disease, sharp rise in d-dimer, agree that PE is a concern. - increase steroid to 90 mg solumedrol for a week, taper tbd based on response - if O2 requirement stabilizes 6L [place] or less, or if switched to [facility] then stable 40% or less without substantial tachypnea could consider bronch to look for atypical infection, DAH (in which case could add plex), or endobronchial manifestation of MPA - diurese for net negative fluid balance as kidney function allows (would hold off today given rise in BUN/S Cr) - agree with empiric treatment of PE, watch for worsening hemoptysis - chair position during the day if he can tolerate   Best Practice (right click and "Reselect all SmartList Selections" daily)  Last date of multidisciplinary goals of care discussion [12/24. Discussed with patient, wife and daughter that if he were to require mechanical ventilation I thought that it would likely only prolong his death rather than provide  opportunity for significant functional recovery or at best would result in prolonged, rocky recovery away from home. He agreed that he would want to be DNR.]  Labs   CBC: Recent Labs  Lab 08/12/21 1110 08/13/21 0419 08/14/21 0101 08/15/21 0051 08/17/21 0204  WBC 9.7 5.7 15.2* 15.6* 14.7*  NEUTROABS 7.5 5.1  --   --   --   HGB 15.1 13.3 13.1 11.7* 11.8*  HCT 44.2 40.7 36.6* 33.6* 35.0*  MCV 84.7 85.7 81.5 82.2 83.5  PLT 256 190 252 281 858    Basic Metabolic Panel: Recent Labs  Lab 08/12/21 1110 08/13/21 0419 08/14/21 0101 08/15/21 0051 08/17/21 0204  NA 127* 130* 130* 131* 134*  K 4.1 4.4 4.0 4.4 4.5  CL 94* 101 103 102 105  CO2 22 20* 18* 20* 22  GLUCOSE 144* 202* 148* 168* 124*  BUN 14 16 20  25* 29*  CREATININE 1.35* 1.11 1.04 1.20 1.28*  CALCIUM 8.2* 7.7* 7.9* 7.9* 7.9*  MG  --  1.9  --   --   --    GFR: Estimated Creatinine Clearance: 45.7 mL/min (A) (by C-G formula based on SCr of 1.28 mg/dL (H)). Recent Labs  Lab 08/12/21 1627 08/12/21 2136 08/13/21 0419 08/14/21 0101 08/15/21 0051 08/17/21 0204  PROCALCITON  --  0.13  --   --   --   --   WBC  --   --  5.7 15.2* 15.6* 14.7*  LATICACIDVEN 1.7  --  1.3  --   --   --     Liver Function Tests: Recent Labs  Lab 08/12/21 1110 08/13/21 0419  AST 34 28  ALT 22 20  ALKPHOS 114 105  BILITOT 1.0 1.2  PROT 6.1* 5.1*  ALBUMIN 3.1* 2.5*   No results for input(s): LIPASE, AMYLASE in the last 168 hours. No results for input(s): AMMONIA in the last 168 hours.  ABG No results found for: PHART, PCO2ART, PO2ART, HCO3, TCO2, ACIDBASEDEF, O2SAT   Coagulation Profile: No results for input(s): INR, PROTIME in the last 168 hours.  Cardiac Enzymes: No results for input(s): CKTOTAL, CKMB, CKMBINDEX, TROPONINI in the last 168 hours.  HbA1C: Hgb A1c MFr Bld  Date/Time Value Ref Range Status  08/07/2021 10:31 AM 6.3 (H) 4.8 - 5.6 % Final    Comment:             Prediabetes: 5.7 - 6.4          Diabetes: >6.4           Glycemic control for adults with diabetes: <7.0   04/04/2021 08:33 AM 6.7 (H) 4.8 - 5.6 % Final    Comment:             Prediabetes: 5.7 - 6.4          Diabetes: >6.4          Glycemic control for adults with diabetes: <7.0     CBG: Recent Labs  Lab 08/16/21 0746 08/16/21 1206 08/16/21 1615 08/16/21 2100 08/17/21 0803  GLUCAP 311* 276* 385* 236* 183*    Review of Systems:   12 point review of systems is negative except as in HPI  Past Medical History:  He,  has a past medical history of Abnormal ANCA test (04/12/2019), Abnormal  CT of the chest, Abnormal findings on diagnostic imaging of lung (04/12/2019), Acute low back pain without sciatica (03/08/2020), Acute low back pain without sciatica (03/08/2020), Acute lower UTI (07/07/2020), Acute pain of left knee (11/30/2019), AKI (acute kidney injury) ([place]), Alpha-1-antitrypsin deficiency carrier (05/04/2019), Anemia, Atherosclerotic heart disease of native coronary artery without angina pectoris (05/03/2018), Body mass index (BMI) 29.0-29.9, adult (01/05/2020), Bradycardia, Burning sensation of feet (01/05/2020), CKD (chronic kidney disease) (05/03/2018), Community acquired pneumonia, Community acquired pneumonia, Coronary artery disease, Cough, Diabetes ([place]), Diabetes mellitus type 2 in obese ([place]) (04/01/2019), Dyslipidemia associated with type 2 diabetes mellitus ([place]) (05/03/2018), Dysuria (09/07/2020), Elevated LFTs (04/01/2019), Elevated rheumatoid factor (04/12/2019), Emphysema (subcutaneous) (surgical) resulting from a procedure (05/03/2018), Essential hypertension (05/03/2018), Fatigue (03/29/2021), Fever, GAD (generalized anxiety disorder), GERD (gastroesophageal reflux disease), Hematuria (09/07/2020), History of alpha-1-antitrypsin deficiency (04/12/2019), History of kidney stones, Hyperlipidemia, Hypoxemia, Idiopathic pulmonary fibrosis ([place]) (03/29/2021), 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), Insomnia (05/03/2018),  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), Leukocytosis (03/08/2020), Low vitamin B12 level (04/03/2019), Lower extremity edema, Medicare annual wellness visit, subsequent (08/09/2020), Microscopic polyangiitis ([place]) (03/29/2021), Mixed hyperlipidemia (05/03/2018), Myalgia (04/19/2019), Osteoarthritis, Other emphysema ([place]), Other long term (current) drug therapy (03/29/2021), Paresthesias in left hand (11/30/2019), Peripheral polyneuropathy (01/30/2020), Pneumonia (04/01/2019), Pneumonitis, Pneumonitis, Polymyositis ([place]), Primary insomnia, Proteinuria (09/07/2020), Renal insufficiency (09/06/2020), Renal stones, Rheumatoid factor positive (03/29/2021), Sepsis ([place]) (04/19/2019), Severe sepsis ([place]) (07/07/2020), Skin cancer, 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   Surgical History:   Past Surgical History:  Procedure Laterality Date   ANGIOPLASTY  2010   BACK SURGERY     CATARACT EXTRACTION     COLONOSCOPY  06/16/2005   Mild colitis involving splenic flexure. Colonic polyps, status post polypectomy. Mild pancolonic diverticulitits. Internal hemorrhoids.    ESOPHAGOGASTRODUODENOSCOPY  04/26/2003   Irregular Z line suggestive of GERD. Mild gastritis status post CLO testing.    TOTAL KNEE ARTHROPLASTY Left 12/17/2020   Procedure: LEFT TOTAL KNEE ARTHROPLASTY;  Surgeon: Leandrew Koyanagi, MD;  Location: [facility];  Service: Orthopedics;  Laterality: Left;   TRIGGER FINGER RELEASE       Social History:   reports that he has never smoked. He has never used smokeless tobacco. He reports that he does not drink alcohol and does not use drugs.   Family History:  His family history includes Clotting disorder in his brother; Heart attack in his sister; Lung disease in his sister; Pancreatic cancer in his sister; Stroke in his father;  Tuberculosis in his mother. There is no history of Colon cancer or Esophageal cancer.   Allergies Allergies  Allergen Reactions   Ace Inhibitors Other (See Comments)    = Slow heart rate   Hydrocodone Nausea And Vomiting   Hydrocodone-Acetaminophen Nausea And Vomiting   Nsaids Other (See Comments)    Kidney issues   Sulfamethoxazole Nausea And Vomiting   Sulfamethoxazole-Trimethoprim Nausea And Vomiting   Trimethoprim Other (See Comments)    Reaction not recalled     Home Medications  Prior to Admission medications   Medication Sig Start Date End Date Taking? Authorizing Provider  aspirin EC 81 MG tablet Take 1 tablet (81 mg total) by mouth 2 (two) times daily. To be taken after surgery Patient taking differently: Take 81 mg by mouth in the morning. 11/07/20  Yes Dwana Melena L, PA-C  azithromycin (ZITHROMAX) 250 MG tablet 2 DAILY FOR FIRST DAY, THEN DECREASE TO  ONE DAILY FOR 4 MORE DAYS. Patient taking differently: Take 250-500 mg by mouth See admin instructions. Starting on 08/09/2021, take 500 mg by mouth on day one, then decrease to 250 mg once a day for 4 days 08/09/21  Yes Cox, Kirsten, MD  cefdinir (OMNICEF) 300 MG capsule Take 1 capsule (300 mg total) by mouth 2 (two) times daily. 08/09/21  Yes Cox, Kirsten, MD  Coenzyme Q10 100 MG TABS Take 100 mg by mouth at bedtime.   Yes [provider]  furosemide (LASIX) 20 MG tablet Take 20 mg by mouth daily as needed for fluid.   Yes [provider]  gabapentin (NEURONTIN) 400 MG capsule Take 400 mg by mouth 2 (two) times daily.   Yes [provider]  Multiple Vitamins-Minerals (PRESERVISION AREDS 2) CAPS Take 1 capsule by mouth 2 (two) times daily after a meal.   Yes [provider]  nitroGLYCERIN (NITROSTAT) 0.4 MG SL tablet Place 1 tablet (0.4 mg total) under the tongue every 5 (five) minutes as needed for chest pain. 09/06/20  Yes Revankar, Reita Cliche, MD  Omega-3 Fatty Acids (FISH OIL) 1000 MG CAPS  Take 2,000 mg by mouth daily.   Yes [provider]  omeprazole (PRILOSEC) 20 MG capsule Take 1 capsule (20 mg total) by mouth daily. Patient taking differently: Take 20 mg by mouth daily before breakfast. 04/23/21  Yes Cox, Kirsten, MD  riTUXimab (RITUXAN) 100 MG/10ML injection Inject 100 mg into the vein every 6 (six) months.   Yes [provider]  rosuvastatin (CRESTOR) 20 MG tablet Take 1 tablet (20 mg total) by mouth daily. Patient taking differently: Take 20 mg by mouth at bedtime. 04/23/21  Yes Cox, Kirsten, MD  vitamin B-12 (CYANOCOBALAMIN) 1000 MCG tablet Take 1 tablet (1,000 mcg total) by mouth daily. Patient taking differently: Take 1,000 mcg by mouth daily with breakfast. 04/06/19  Yes Vann, Jessica U, DO  docusate sodium (COLACE) 100 MG capsule Take 100 mg by mouth 2 (two) times daily as needed for mild constipation. Patient not taking: Reported on 08/12/2021    [provider]  glucose blood (TRUE METRIX BLOOD GLUCOSE TEST) test strip E11.69 TEST BLOOD SUGAR THREE TIMES DAILY BEFORE MEALS 10/03/20   [name], MD     Critical care time: 32 minutes

## 2021-08-18 ENCOUNTER — Inpatient Hospital Stay (HOSPITAL_COMMUNITY): Payer: Medicare HMO

## 2021-08-18 DIAGNOSIS — E1142 Type 2 diabetes mellitus with diabetic polyneuropathy: Secondary | ICD-10-CM | POA: Diagnosis not present

## 2021-08-18 DIAGNOSIS — N1831 Chronic kidney disease, stage 3a: Secondary | ICD-10-CM | POA: Diagnosis not present

## 2021-08-18 DIAGNOSIS — R0609 Other forms of dyspnea: Secondary | ICD-10-CM

## 2021-08-18 DIAGNOSIS — E871 Hypo-osmolality and hyponatremia: Secondary | ICD-10-CM | POA: Diagnosis not present

## 2021-08-18 LAB — GLUCOSE, CAPILLARY
Glucose-Capillary: 205 mg/dL — ABNORMAL HIGH (ref 70–99)
Glucose-Capillary: 245 mg/dL — ABNORMAL HIGH (ref 70–99)
Glucose-Capillary: 184 mg/dL — ABNORMAL HIGH (ref 70–99)
Glucose-Capillary: 234 mg/dL — ABNORMAL HIGH (ref 70–99)

## 2021-08-18 LAB — CBC
HCT: 34.8 % — ABNORMAL LOW (ref 39.0–52.0)
Hemoglobin: 11.7 g/dL — ABNORMAL LOW (ref 13.0–17.0)
MCH: 28.3 pg (ref 26.0–34.0)
MCHC: 33.6 g/dL (ref 30.0–36.0)
MCV: 84.3 fL (ref 80.0–100.0)
Platelets: 320 10*3/uL (ref 150–400)
RBC: 4.13 MIL/uL — ABNORMAL LOW (ref 4.22–5.81)
RDW: 12.9 % (ref 11.5–15.5)
WBC: 12 10*3/uL — ABNORMAL HIGH (ref 4.0–10.5)
nRBC: 0.2 % (ref 0.0–0.2)

## 2021-08-18 LAB — COMPREHENSIVE METABOLIC PANEL
ALT: 82 U/L — ABNORMAL HIGH (ref 0–44)
AST: 43 U/L — ABNORMAL HIGH (ref 15–41)
Albumin: 2.3 g/dL — ABNORMAL LOW (ref 3.5–5.0)
Alkaline Phosphatase: 146 U/L — ABNORMAL HIGH (ref 38–126)
Anion gap: 8 (ref 5–15)
BUN: 39 mg/dL — ABNORMAL HIGH (ref 8–23)
CO2: 22 mmol/L (ref 22–32)
Calcium: 7.8 mg/dL — ABNORMAL LOW (ref 8.9–10.3)
Chloride: 105 mmol/L (ref 98–111)
Creatinine, Ser: 1.32 mg/dL — ABNORMAL HIGH (ref 0.61–1.24)
GFR, Estimated: 54 mL/min — ABNORMAL LOW (ref >60)
Glucose, Bld: 185 mg/dL — ABNORMAL HIGH (ref 70–99)
Potassium: 4.3 mmol/L (ref 3.5–5.1)
Sodium: 135 mmol/L (ref 135–145)
Total Bilirubin: 1.3 mg/dL — ABNORMAL HIGH (ref 0.3–1.2)
Total Protein: 4.9 g/dL — ABNORMAL LOW (ref 6.5–8.1)

## 2021-08-18 LAB — TYPE AND SCREEN
ABO/RH(D): A POS
Antibody Screen: NEGATIVE

## 2021-08-18 LAB — ECHOCARDIOGRAM LIMITED
AR max vel: 3.35 cm2
AV Area VTI: 3.32 cm2
AV Area mean vel: 3.27 cm2
AV Mean grad: 3 mmHg
AV Peak grad: 6.2 mmHg
Ao pk vel: 1.24 m/s
Area-P 1/2: 2.43 cm2
Height: 67 in
P 1/2 time: 466 msec
S' Lateral: 2.8 cm
Weight: 3024 oz

## 2021-08-18 LAB — SAR COV2 SEROLOGY (COVID19)AB(IGG),IA: SARS-CoV-2 Ab, IgG: NONREACTIVE

## 2021-08-18 MED ORDER — ENOXAPARIN SODIUM 40 MG/0.4ML IJ SOSY
40.0000 mg | PREFILLED_SYRINGE | Freq: Two times a day (BID) | INTRAMUSCULAR | Status: DC
  Administered 2021-08-19: 06:00:00 40 mg via SUBCUTANEOUS
  Filled 2021-08-18: qty 0.4

## 2021-08-18 MED ORDER — IOHEXOL 350 MG/ML SOLN
63.0000 mL | Freq: Once | INTRAVENOUS | Status: AC | PRN
  Administered 2021-08-18: 13:00:00 63 mL via INTRAVENOUS

## 2021-08-18 MED ORDER — PANTOPRAZOLE SODIUM 40 MG PO TBEC
40.0000 mg | DELAYED_RELEASE_TABLET | Freq: Every day | ORAL | Status: DC
  Administered 2021-08-18 – 2021-08-25 (×8): 40 mg via ORAL
  Filled 2021-08-18 (×8): qty 1

## 2021-08-18 MED ORDER — SODIUM CHLORIDE 0.9% IV SOLUTION: Freq: Once | INTRAVENOUS | Status: AC

## 2021-08-18 MED ORDER — DIPHENHYDRAMINE HCL 50 MG/ML IJ SOLN
25.0000 mg | Freq: Once | INTRAMUSCULAR | Status: AC
  Administered 2021-08-20: 22:00:00 25 mg via INTRAVENOUS
  Filled 2021-08-18: qty 1

## 2021-08-18 MED ORDER — MAGIC MOUTHWASH W/LIDOCAINE
10.0000 mL | Freq: Three times a day (TID) | ORAL | Status: DC | PRN
  Filled 2021-08-18: qty 10

## 2021-08-18 MED ORDER — MENTHOL 3 MG MT LOZG
1.0000 | LOZENGE | OROMUCOSAL | Status: DC | PRN
  Administered 2021-08-19 (×2): 3 mg via ORAL
  Filled 2021-08-18 (×5): qty 9

## 2021-08-18 MED ORDER — DICYCLOMINE HCL 10 MG/5ML PO SOLN
10.0000 mg | Freq: Once | ORAL | Status: AC
  Administered 2021-08-18: 02:00:00 10 mg via ORAL
  Filled 2021-08-18: qty 5

## 2021-08-18 MED ORDER — INSULIN ASPART 100 UNIT/ML IJ SOLN
8.0000 [IU] | Freq: Three times a day (TID) | INTRAMUSCULAR | Status: DC
  Administered 2021-08-18 – 2021-08-25 (×20): 8 [IU] via SUBCUTANEOUS

## 2021-08-18 MED ORDER — INSULIN GLARGINE-YFGN 100 UNIT/ML ~~LOC~~ SOLN
22.0000 [IU] | Freq: Every day | SUBCUTANEOUS | Status: DC
  Administered 2021-08-19 – 2021-08-25 (×7): 22 [IU] via SUBCUTANEOUS
  Filled 2021-08-18 (×7): qty 0.22

## 2021-08-18 MED ORDER — ALUM & MAG HYDROXIDE-SIMETH 200-200-20 MG/5ML PO SUSP
30.0000 mL | Freq: Once | ORAL | Status: AC
  Administered 2021-08-18: 02:00:00 30 mL via ORAL
  Filled 2021-08-18: qty 30

## 2021-08-18 MED ORDER — ACETAMINOPHEN 325 MG PO TABS
650.0000 mg | ORAL_TABLET | Freq: Once | ORAL | Status: DC
  Filled 2021-08-18: qty 2

## 2021-08-18 NOTE — Progress Notes (Addendum)
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  Patient was admitted and started on steroids/broad-spectrum antibiotics-patient gradually improved-and had minimal hypoxia requiring 2-3 L of oxygen.  Steroid dosage was gradually tapered down-on 12/24-patient was tapered down to prednisone-however patient developed significant worsening of his hypoxemia-patient was evaluated by PCCM-and restarted on IV steroids.  See below for further details.   Significant events: 12/05>> COVID-19 positive on home testing 12/19>> admit for shortness of breath-COVID-19 PCR positive.  CT value of 28.6. 12/24>> worsening hypoxemia-started on salter high flow.  PCCM consulted.  Subjective: Requiring anywhere from 7 to 10 L of salter high flow.  Still with coughing spells.  Per spouse-gets short of breath/desaturates easily with minimal activity and coughing spells but  Objective: Vitals: Blood pressure (!) 105/57, pulse 79, temperature 98.6 F (37 C), temperature source Axillary, resp. rate (!) 22, height 5\' 7"  (1.702 m), weight 85.7 kg, SpO2 96 %.   Physical exam: Gen Exam:Alert awake-not in any distress HEENT:atraumatic, normocephalic Chest: B/L clear to auscultation anteriorly CVS:S1S2 regular Abdomen:soft non tender, non distended Extremities:no edema Neurology: Non focal Skin: no rash    Recent Labs     08/17/21 0204 08/17/21 1109  DDIMER  --  13.74*  CRP 2.9*  --      Assessment & Plan: Acute hypoxic respiratory failure due to multifocal pneumonia in a immunocompromised patient (history of microscopic polyangiitis on Rituxan infusion as outpatient) with recent COVID-19 infection: Likely due to ongoing COVID-19 pneumonitis-hypoxia worsened once steroid dosage was de-escalated to prednisone on 12/24-has been restarted back on IV Solu-Medrol.  Furthermore-does not appear to have made COVID-19 antibodies in spite of having 3 vaccines and natural infection.  Have discussed with ID MD-Dr. Baxter Flattery who will evaluate to see if we can give patient monoclonal antibody or convalescent plasma.  No evidence of volume overload-do not think patient requires diuretics.  D-dimer significantly elevated yesterday-already on therapeutic Lovenox-lower extremity Dopplers negative for DVT-awaiting CTA chest.  Suspect can stop cefepime after today's dose-as suspicion for bacterial pneumonia is low-and patient will have completed 7 days of empiric treatment.  Will await further recommendations from PCCM and ID.  Addendum: Discussed with ID MD Dr. Zadie Cleverly that we transfuse COVID convalescent plasma.  I have already discussed the rationale for convalescent plasma use with patient/spouse earlier this morning-all are aware that this is off label use-risks of plasma infusion discussed in detail.  Patient/family accepting all risks.  History of ILD/microscopic polyangiitis: On Rituxan every 6 months (last dose June 2022)-we will schedule to get a injection earlier this month-however has now been postponed due to COVID-19 diagnoses.  See above.  Elevated D-dimer: Likely due to COVID-19 infection associated inflammation-lower extremity Doppler on 12/24 negative for DVT-awaiting CTA chest.  On empiric therapeutic anticoagulation until VTE ruled out.  Addendum: CTA Chest/Dopplers neg for VTE-will switch to intermediate  dosing of Lovenox.  Leukocytosis: Likely due to steroids-cultures negative so far-continue to monitor.  CKD stage IIIa: Creatinine at baseline-watch closely.  Discussed at length with patient/spouse at bedside-and patient's son Nicki Reaper over the phone-regarding obtaining CTA chest-all aware of potential for contrast-induced nephropathy-patient accepting risks and willing to proceed.  Hyponatremia: Mild-improved-monitor periodically.  CAD: No anginal symptoms-continue aspirin/statin.  HLD: Continue statin  DM-2 (A1c 6.3 on 12/14) with uncontrolled hyperglycemia due to steroids: CBGs remain on the higher side-increase Semglee to 22 units, increase Premeal NovoLog to 8 units-continue SSI-follow and adjust.   Recent Labs    08/17/21 2112 08/18/21 0737 08/18/21 1144  GLUCAP 373* 205* 245*     Peripheral neuropathy: Likely due to DM-continue Neurontin  HLD: Continue statin  GERD: Continue PPI  DVT prophylaxis: Lovenox Code Status: DNR Family Communication: Spouse at bedside on 12/25-with son Nicki Reaper on the phone  Disposition:   Status is: Inpatient  Remains inpatient appropriate because: IV ABX and steroids-if clinical improvement continues-Home in the next few days.    Consultants:  none   Data Reviewed: I have personally reviewed following labs and imaging studies  CBC: Recent Labs  Lab 08/12/21 1110 08/13/21 0419 08/14/21 0101 08/15/21 0051 08/17/21 0204 08/18/21 0119  WBC 9.7 5.7 15.2* 15.6* 14.7* 12.0*  NEUTROABS 7.5 5.1  --   --   --   --   HGB 15.1 13.3 13.1 11.7* 11.8* 11.7*  HCT 44.2 40.7 36.6* 33.6* 35.0* 34.8*  MCV 84.7 85.7 81.5 82.2 83.5 84.3  PLT 256 190 252 281 310 320     Basic Metabolic Panel: Recent Labs  Lab 08/13/21 0419 08/14/21 0101 08/15/21 0051 08/17/21 0204 08/18/21 0119  NA 130* 130* 131* 134* 135  K 4.4 4.0 4.4 4.5 4.3  CL 101 103 102 105 105  CO2 20* 18* 20* 22 22  GLUCOSE 202* 148* 168* 124* 185*  BUN 16 20 25* 29* 39*   CREATININE 1.11 1.04 1.20 1.28* 1.32*  CALCIUM 7.7* 7.9* 7.9* 7.9* 7.8*  MG 1.9  --   --   --   --      GFR: Estimated Creatinine Clearance: 44.3 mL/min (A) (by C-G formula based on SCr of 1.32 mg/dL (H)).  Liver Function Tests: Recent Labs  Lab 08/12/21 1110 08/13/21 0419 08/18/21 0119  AST 34 28 43*  ALT 22 20 82*  ALKPHOS 114 105 146*  BILITOT 1.0 1.2 1.3*  PROT 6.1* 5.1* 4.9*  ALBUMIN 3.1* 2.5* 2.3*     CBG: Recent Labs  Lab 08/17/21 1218 08/17/21 1633 08/17/21 2112 08/18/21 0737 08/18/21 1144  GLUCAP 148* 243* 373* 205* 245*      Recent Results (from the past 240 hour(s))  Resp Panel by RT-PCR (Flu A&B, Covid) Nasopharyngeal Swab     Status: Abnormal   Collection Time: 08/12/21 11:11 AM   Specimen: Nasopharyngeal Swab; Nasopharyngeal(NP) swabs in vial transport medium  Result Value Ref Range Status   SARS Coronavirus 2 by RT PCR POSITIVE (A) NEGATIVE Final    Comment: (NOTE) SARS-CoV-2 target nucleic acids are DETECTED.  The SARS-CoV-2 RNA is generally detectable in upper respiratory specimens during the acute phase of infection. Positive results are indicative of the presence of the identified virus, but do not rule out bacterial infection or co-infection with other pathogens not detected by the test. Clinical correlation with patient history and other diagnostic information is necessary to determine patient infection status. The expected result is Negative.  Fact Sheet for Patients: EntrepreneurPulse.com.au  Fact Sheet for Healthcare Providers: IncredibleEmployment.be  This test is not yet approved or  cleared by the [place] and  has been authorized for detection and/or diagnosis of SARS-CoV-2 by FDA under an Emergency Use Authorization (EUA).  This EUA will remain in effect (meaning this test can be used) for the duration of  the COVID-19 declaration under Section 564(b)(1) of the A ct, 21 U.S.C.  section 360bbb-3(b)(1), unless the authorization is terminated or revoked sooner.     Influenza A by PCR NEGATIVE NEGATIVE Final   Influenza B by PCR NEGATIVE NEGATIVE Final    Comment: (NOTE) The Xpert Xpress SARS-CoV-2/FLU/RSV plus assay is intended as an aid in the diagnosis of influenza from Nasopharyngeal swab specimens and should not be used as a sole basis for treatment. Nasal washings and aspirates are unacceptable for Xpert Xpress SARS-CoV-2/FLU/RSV testing.  Fact Sheet for Patients: EntrepreneurPulse.com.au  Fact Sheet for Healthcare Providers: IncredibleEmployment.be  This test is not yet approved or cleared by the [place] FDA and has been authorized for detection and/or diagnosis of SARS-CoV-2 by FDA under an Emergency Use Authorization (EUA). This EUA will remain in effect (meaning this test can be used) for the duration of the COVID-19 declaration under Section 564(b)(1) of the Act, 21 U.S.C. section 360bbb-3(b)(1), unless the authorization is terminated or revoked.  Performed at [facility], [place] [address] [place], [place] [address]   Culture, blood (routine x 2)     Status: None   Collection Time: 08/12/21  9:34 PM   Specimen: BLOOD LEFT HAND  Result Value Ref Range Status   Specimen Description BLOOD LEFT HAND  Final   Special Requests   Final    BOTTLES DRAWN AEROBIC AND ANAEROBIC Blood Culture adequate volume   Culture   Final    NO GROWTH 5 DAYS Performed at [facility], [place] [address], [place], [place] [address]    Report Status 08/17/2021 FINAL  Final  Respiratory (~20 pathogens) panel by PCR     Status: None   Collection Time: 08/12/21  9:35 PM   Specimen: Nasopharyngeal Swab; Respiratory  Result Value Ref Range Status   Adenovirus NOT DETECTED NOT DETECTED Final   Coronavirus 229E NOT DETECTED NOT DETECTED Final    Comment: (NOTE) The Coronavirus on the Respiratory Panel, DOES NOT test for  the novel  Coronavirus (2019 nCoV)    Coronavirus HKU1 NOT DETECTED NOT DETECTED Final   Coronavirus NL63 NOT DETECTED NOT DETECTED Final   Coronavirus OC43 NOT DETECTED NOT DETECTED Final   Metapneumovirus NOT DETECTED NOT DETECTED Final   Rhinovirus / Enterovirus NOT DETECTED NOT DETECTED Final   Influenza A NOT DETECTED NOT DETECTED Final   Influenza B NOT DETECTED NOT DETECTED Final   Parainfluenza Virus 1 NOT DETECTED NOT DETECTED Final   Parainfluenza Virus 2 NOT DETECTED NOT DETECTED Final   Parainfluenza Virus 3 NOT DETECTED NOT DETECTED Final   Parainfluenza Virus 4 NOT DETECTED NOT DETECTED Final   Respiratory Syncytial Virus NOT DETECTED NOT DETECTED Final   Bordetella pertussis NOT DETECTED NOT DETECTED Final   Bordetella Parapertussis NOT DETECTED NOT DETECTED Final   Chlamydophila pneumoniae NOT DETECTED NOT DETECTED Final   Mycoplasma pneumoniae NOT DETECTED NOT DETECTED Final    Comment: Performed at [facility], [place] [address], [place], [place] [address]  Culture, blood (routine x 2)     Status: None   Collection Time: 08/12/21  9:39 PM   Specimen: BLOOD RIGHT HAND  Result Value Ref Range Status   Specimen Description BLOOD  RIGHT HAND  Final   Special Requests   Final    BOTTLES DRAWN AEROBIC AND ANAEROBIC Blood Culture adequate volume   Culture   Final    NO GROWTH 5 DAYS Performed at [facility], [address] [place], [place] [address]    Report Status 08/17/2021 FINAL  Final  MRSA Next Gen by PCR, Nasal     Status: None   Collection Time: 08/12/21 10:42 PM   Specimen: Nasal Mucosa; Nasal Swab  Result Value Ref Range Status   MRSA by PCR Next Gen NOT DETECTED NOT DETECTED Final    Comment: (NOTE) The GeneXpert MRSA Assay (FDA approved for NASAL specimens only), is one component of a comprehensive MRSA colonization surveillance program. It is not intended to diagnose MRSA infection nor to guide or monitor treatment for MRSA infections. Test  performance is not FDA approved in patients less than 22 years old. Performed at [facility], [place] [address], [place], [place] [address]           Radiology Studies: [facility] Chest Port 1V same Day  Result Date: 08/17/2021 CLINICAL DATA:  Pneumonia EXAM: PORTABLE CHEST 1 VIEW COMPARISON:  08/16/2021 FINDINGS: Patchy bilateral airspace disease again noted, similar to prior study. Heart is borderline in size. No effusions. No acute bony abnormality. IMPRESSION: Patchy bilateral airspace disease compatible with pneumonia. No real change. Electronically Signed   By: Rolm Baptise M.D.   On: 08/17/2021 10:19   DG Chest Port 1V same Day  Result Date: 08/16/2021 CLINICAL DATA:  Short of breath EXAM: PORTABLE CHEST 1 VIEW COMPARISON:  Chest x-ray 08/12/2021.  Chest CT 08/12/2021 FINDINGS: Patchy bilateral infiltrates with mild progression. No pleural effusion Heart size enlarged.  No vascular congestion. IMPRESSION: Progression of patchy bilateral infiltrates compatible with pneumonia. Correlate with COVID status. Electronically Signed   By: Franchot Gallo M.D.   On: 08/16/2021 14:51   VAS [place] LOWER EXTREMITY VENOUS (DVT)  Result Date: 08/17/2021  Lower Venous DVT Study Patient Name:  [facility]  Date of Exam:   08/17/2021 Medical Rec #: [id number]       Accession #:    [id number] Date of Birth: November 15, 1935       Patient Gender: M Patient Age:   75 years Exam Location:  [facility] Procedure:      VAS [place] LOWER EXTREMITY VENOUS (DVT) Referring Phys: Oren Binet --------------------------------------------------------------------------------  Indications: Hypoxia.  Risk Factors: COVID 19 positive. Limitations: Poor ultrasound/tissue interface and patient positioning. Comparison Study: No prior studies. Performing Technologist: Oliver Hum RVT  Examination Guidelines: A complete evaluation includes B-mode imaging, spectral Doppler, color Doppler, and power Doppler as needed  of all accessible portions of each vessel. Bilateral testing is considered an integral part of a complete examination. Limited examinations for reoccurring indications may be performed as noted. The reflux portion of the exam is performed with the patient in reverse Trendelenburg.  +---------+---------------+---------+-----------+----------+--------------+  RIGHT     Compressibility Phasicity Spontaneity Properties Thrombus Aging  +---------+---------------+---------+-----------+----------+--------------+  CFV       Full            Yes       Yes                                    +---------+---------------+---------+-----------+----------+--------------+  SFJ       Full                                                             +---------+---------------+---------+-----------+----------+--------------+  FV Prox   Full                                                             +---------+---------------+---------+-----------+----------+--------------+  FV Mid    Full                                                             +---------+---------------+---------+-----------+----------+--------------+  FV Distal Full                                                             +---------+---------------+---------+-----------+----------+--------------+  PFV       Full                                                             +---------+---------------+---------+-----------+----------+--------------+  POP       Full            Yes       Yes                                    +---------+---------------+---------+-----------+----------+--------------+  PTV       Full                                                             +---------+---------------+---------+-----------+----------+--------------+  PERO      Full                                                             +---------+---------------+---------+-----------+----------+--------------+    +---------+---------------+---------+-----------+----------+--------------+  LEFT      Compressibility Phasicity Spontaneity Properties Thrombus Aging  +---------+---------------+---------+-----------+----------+--------------+  CFV       Full            Yes       Yes                                    +---------+---------------+---------+-----------+----------+--------------+  SFJ       Full                                                             +---------+---------------+---------+-----------+----------+--------------+  FV Prox   Full                                                             +---------+---------------+---------+-----------+----------+--------------+  FV Mid    Full                                                             +---------+---------------+---------+-----------+----------+--------------+  FV Distal Full                                                             +---------+---------------+---------+-----------+----------+--------------+  PFV       Full                                                             +---------+---------------+---------+-----------+----------+--------------+  POP       Full            Yes       Yes                                    +---------+---------------+---------+-----------+----------+--------------+  PTV       Full                                                             +---------+---------------+---------+-----------+----------+--------------+  PERO      Full                                                             +---------+---------------+---------+-----------+----------+--------------+    Summary: RIGHT: - There is no evidence of deep vein thrombosis in the lower extremity.  - No cystic structure found in the popliteal fossa.  LEFT: - There is no evidence of deep vein thrombosis in the lower extremity.  - No cystic structure found in the popliteal fossa.  *See table(s) above for measurements and observations.    Preliminary          Scheduled Meds:  vitamin C  500 mg Oral Daily   aspirin EC  81 mg Oral Daily   benzonatate  200 mg Oral TID   enoxaparin (LOVENOX) injection  85 mg Subcutaneous Q12H   gabapentin  400 mg Oral BID   guaiFENesin  600 mg Oral BID  insulin aspart  0-15 Units Subcutaneous TID AC & HS   insulin aspart  4 Units Subcutaneous TID WC   insulin glargine-yfgn  16 Units Subcutaneous Daily   methylPREDNISolone (SOLU-MEDROL) injection  90 mg Intravenous Daily   rosuvastatin  20 mg Oral QHS   zinc sulfate  220 mg Oral Daily   Continuous Infusions:  ceFEPime (MAXIPIME) IV 2 g (08/18/21 0906)     LOS: 6 days       Oren Binet, MD [facility]   To contact the attending provider between 7A-7P or the covering provider during after hours 7P-7A, please log into the web site [email or web address] and access using universal [place] password for that web site. If you do not have the password, please call the hospital operator.  08/18/2021, 12:54 PM

## 2021-08-18 NOTE — Progress Notes (Signed)
Pharmacy Antibiotic Note  Kerry Matthews is a 85 y.o. male admitted on 08/12/2021 with pneumonia.  Pharmacy has been consulted for cefepime dosing.  Worsening hypoxia >> HFNC 5L/min SCr trending up, but CrCl remains >30 WBC trending down (14.7>12); LA normal; CRP trending down Afebrile (Tmax 37C)  Plan: Azithromycin per MD Cefepime 2g IV every 12 hours Monitor clinical course, renal function, WBC F/u on cultures and de-escalate as able   Height: 5\' 7"  (170.2 cm) Weight: 85.7 kg (189 lb) IBW/kg (Calculated) : 66.1  Temp (24hrs), Avg:98.1 F (36.7 C), Min:97.6 F (36.4 C), Max:98.6 F (37 C)  Recent Labs  Lab 08/12/21 1627 08/13/21 0419 08/14/21 0101 08/15/21 0051 08/17/21 0204 08/18/21 0119  WBC  --  5.7 15.2* 15.6* 14.7* 12.0*  CREATININE  --  1.11 1.04 1.20 1.28* 1.32*  LATICACIDVEN 1.7 1.3  --   --   --   --      Estimated Creatinine Clearance: 44.3 mL/min (A) (by C-G formula based on SCr of 1.32 mg/dL (H)).    Allergies  Allergen Reactions   Ace Inhibitors Other (See Comments)    = Slow heart rate   Hydrocodone Nausea And Vomiting   Hydrocodone-Acetaminophen Nausea And Vomiting   Nsaids Other (See Comments)    Kidney issues   Sulfamethoxazole Nausea And Vomiting   Sulfamethoxazole-Trimethoprim Nausea And Vomiting   Trimethoprim Other (See Comments)    Reaction not recalled    Antimicrobials this admission: azithromycin 12/19 >>  cefepime 12/19 >>  vancomycin 12/19 >> 12/21  Microbiology results: 12/19 BCx: ngtd 12/19 MRSA: negative 12/19 COVID +  Joseph Art, Pharm.D. PGY-1 Pharmacy Resident GYBWL:[phone] 08/18/2021 7:22 AM

## 2021-08-18 NOTE — Progress Notes (Signed)
ANTICOAGULATION CONSULT NOTE - Initial Consult  Pharmacy Consult for Lovenox Indication: pulmonary embolus  Allergies  Allergen Reactions   Ace Inhibitors Other (See Comments)    = Slow heart rate   Hydrocodone Nausea And Vomiting   Hydrocodone-Acetaminophen Nausea And Vomiting   Nsaids Other (See Comments)    Kidney issues   Sulfamethoxazole Nausea And Vomiting   Sulfamethoxazole-Trimethoprim Nausea And Vomiting   Trimethoprim Other (See Comments)    Reaction not recalled    Patient Measurements: Height: 5\' 7"  (170.2 cm) Weight: 85.7 kg (189 lb) IBW/kg (Calculated) : 66.1  Vital Signs: Temp: 98.6 F (37 C) (12/25 0429) Temp Source: Axillary (12/25 0429) BP: 120/61 (12/25 0429) Pulse Rate: 66 (12/25 0429)  Labs: Recent Labs    08/17/21 0204 08/18/21 0119  HGB 11.8* 11.7*  HCT 35.0* 34.8*  PLT 310 320  CREATININE 1.28* 1.32*     Estimated Creatinine Clearance: 44.3 mL/min (A) (by C-G formula based on SCr of 1.32 mg/dL (H)).   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  ANCA antibodies- myeloperoxidase ABS-greater than 100, ANCA proteinase 3-less than 3.5 04/02/2019- ANCA titers- p-ANCA +1: 160, C ANCA-less than 1: 20, atypical p-ANCA titer-less than 1: 20   Abnormal CT of the chest    Abnormal findings on diagnostic imaging of lung 04/12/2019   04/01/2019-CT chest with contrast- no acute process in chest abdomen or pelvis, interstitial lung disease suspicious for early or mild UIP, pulmonary artery enlargement suggest PAH    Acute low back pain without sciatica 03/08/2020   Acute low back pain without sciatica 03/08/2020   Acute lower UTI 07/07/2020   Acute pain of left knee 11/30/2019   AKI (acute kidney injury) ([place])    Alpha-1-antitrypsin deficiency carrier 05/04/2019   Anemia    Atherosclerotic heart disease of native coronary artery without angina pectoris 05/03/2018   Body mass index (BMI)  29.0-29.9, adult 01/05/2020   Bradycardia    Burning sensation of feet 01/05/2020   CKD (chronic kidney disease) 05/03/2018   Community acquired pneumonia    Community acquired pneumonia    Coronary artery disease    Cough    Diabetes ([place])    Diabetes mellitus type 2 in obese ([place]) 04/01/2019   Dyslipidemia associated with type 2 diabetes mellitus ([place]) 05/03/2018   Dysuria 09/07/2020   Elevated LFTs 04/01/2019   Elevated rheumatoid factor 04/12/2019   04/03/2019-rheumatoid factor-95.4   Emphysema (subcutaneous) (surgical) resulting from a procedure 05/03/2018   Essential hypertension 05/03/2018   Fatigue 03/29/2021   Fever    GAD (generalized anxiety disorder)    GERD (gastroesophageal reflux disease)    Hematuria 09/07/2020   History of alpha-1-antitrypsin deficiency 04/12/2019   History of kidney stones    Hyperlipidemia    Hypoxemia    Idiopathic pulmonary fibrosis ([place]) 03/29/2021   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/29/2021   Insomnia 05/03/2018   Interstitial pulmonary disease ([place]) 04/12/2019   04/01/2019-CT chest with contrast- no acute process in chest abdomen or pelvis, interstitial lung disease suspicious for early or mild UIP, pulmonary artery enlargement suggest PAH  04/02/2019-connective tissue work-up: Anti-Jo 1-negative Anti-DNA antibody double-stranded-negative Anti-scleroderma antibody-negative Sjogren's syndrome antibody-negative Sjogren's syndrome antibody-negative CK-31 CCP-6 E   Leukocytosis 03/08/2020   Low vitamin B12 level 04/03/2019   Lower extremity edema    Medicare annual wellness visit, subsequent 08/09/2020   Microscopic polyangiitis ([place]) 03/29/2021   Mixed hyperlipidemia 05/03/2018   Myalgia 04/19/2019  Osteoarthritis    Other emphysema ([place])    Other long term (current) drug therapy 03/29/2021   Paresthesias in left hand 11/30/2019   Peripheral polyneuropathy 01/30/2020   Pneumonia 04/01/2019   Pneumonitis    Pneumonitis    Polymyositis ([place])     Primary insomnia    Proteinuria 09/07/2020   Renal insufficiency 09/06/2020   Renal stones    Rheumatoid factor positive 03/29/2021   Sepsis ([place]) 04/19/2019   Severe sepsis ([place]) 07/07/2020   Skin cancer    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/17/2020   Transaminitis    Trigger finger 05/03/2018   UC (ulcerative colitis) ([place])    Urinary urgency 09/07/2020   UTI (urinary tract infection)    Vasculitis ([place]) 03/29/2021    Medications:  Scheduled:   vitamin C  500 mg Oral Daily   aspirin EC  81 mg Oral Daily   benzonatate  200 mg Oral TID   enoxaparin (LOVENOX) injection  85 mg Subcutaneous Q12H   gabapentin  400 mg Oral BID   guaiFENesin  600 mg Oral BID   insulin aspart  0-15 Units Subcutaneous TID AC & HS   insulin aspart  4 Units Subcutaneous TID WC   insulin glargine-yfgn  16 Units Subcutaneous Daily   methylPREDNISolone (SOLU-MEDROL) injection  90 mg Intravenous Daily   rosuvastatin  20 mg Oral QHS   zinc sulfate  220 mg Oral Daily   Infusions:   ceFEPime (MAXIPIME) IV 2 g (08/17/21 2226)    Assessment: Increase in oxygen need from 2L [place] to 15L HFNC and an increase in ddimer from 0.81 to 13.74. Imaging is unclear, however, due to the quick decline and markers indicating his condition is likely PE.   CBC stable, no s/sx of bleeding noted. SCr trending up. Lower extremity ultrasound unremarkable for DVT.   Goal of Therapy:  Anti-Xa level 0.6-1 units/ml 4hrs after LMWH dose given Monitor platelets by anticoagulation protocol: Yes   Plan:  Continue Lovenox 85 mg [place] q12h Monitor for signs and symptoms of bleeding Follow-up CBC and renal function  Joseph Art, Pharm.D. PGY-1 Pharmacy Resident [id number] 08/18/2021 7:15 AM

## 2021-08-18 NOTE — Consult Note (Signed)
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 He Tested + for covid-19 with ag home test on 12/5 and astarted on molnupiravir. But due to worsening symptoms he was seen by PCP on 12/16 concern for secondary bacterial pneumonia and give IM CTX with oral azithro plus cefdinir. He was admitted on 12/19 for worsening shortness of breath, ongiong fevers, productive cough with decrease oral intake. On admit, his chest CT showed patchy bilateral infiltrates per my read, lab abn includes sNa of 127. Cr 1.35, but WBC nL. He was started on IVF, and IV steroids, he only had 3L [place] up until 12/24 when he increased back up to 15L in the setting of also changing to oral steroids. CXR showing more extensive opacities. SARS-CoV2 ab checked which were non-reactive.D-dimer increased up to 13.74 as possible explanation of PE causing accounting for worsening hypoxia. ID asked to weigh in on other additional COVID-19 treatment options. Pulmonology also involved and wonder if this is infection related  pneumonitis, organizing pneumonia, DAH and possible PE. Patient reports mostly dry cough, difficulty bringing up phlegm, abdominal muscle pain from coughing.  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  ANCA antibodies- myeloperoxidase ABS-greater than 100, ANCA proteinase 3-less than 3.5 04/02/2019- ANCA titers- p-ANCA +1: 160, C ANCA-less than 1: 20, atypical p-ANCA titer-less than 1: 20   Abnormal CT of the chest    Abnormal findings on diagnostic imaging of lung 04/12/2019   04/01/2019-CT chest with contrast- no acute process in chest abdomen or pelvis, interstitial lung disease suspicious for early or mild UIP, pulmonary artery enlargement suggest PAH    Acute low back pain without sciatica 03/08/2020   Acute low back pain without sciatica 03/08/2020   Acute lower UTI 07/07/2020   Acute pain of left knee 11/30/2019   AKI (acute kidney injury) ([place])    Alpha-1-antitrypsin deficiency carrier 05/04/2019   Anemia    Atherosclerotic heart disease of native coronary artery without angina pectoris 05/03/2018   Body mass index (BMI) 29.0-29.9, adult 01/05/2020   Bradycardia    Burning sensation of feet 01/05/2020   CKD (chronic kidney disease) 05/03/2018   Community acquired pneumonia    Community acquired pneumonia    Coronary artery disease    Cough    Diabetes ([place])    Diabetes mellitus type 2 in obese ([place]) 04/01/2019   Dyslipidemia associated with type 2 diabetes mellitus ([place]) 05/03/2018   Dysuria 09/07/2020   Elevated LFTs 04/01/2019   Elevated rheumatoid factor 04/12/2019   04/03/2019-rheumatoid factor-95.4   Emphysema (  subcutaneous) (surgical) resulting from a procedure 05/03/2018   Essential hypertension 05/03/2018   Fatigue 03/29/2021   Fever    GAD (generalized anxiety disorder)    GERD (gastroesophageal reflux disease)    Hematuria 09/07/2020   History of alpha-1-antitrypsin deficiency 04/12/2019   History of kidney stones     Hyperlipidemia    Hypoxemia    Idiopathic pulmonary fibrosis ([place]) 03/29/2021   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   Insomnia 05/03/2018   Interstitial pulmonary disease ([place]) 04/12/2019   04/01/2019-CT chest with contrast- no acute process in chest abdomen or pelvis, interstitial lung disease suspicious for early or mild UIP, pulmonary artery enlargement suggest PAH  04/02/2019-connective tissue work-up: Anti-Jo 1-negative Anti-DNA antibody double-stranded-negative Anti-scleroderma antibody-negative Sjogren's syndrome antibody-negative Sjogren's syndrome antibody-negative CK-31 CCP-6 E   Leukocytosis 03/08/2020   Low vitamin B12 level 04/03/2019   Lower extremity edema    Medicare annual wellness visit, subsequent 08/09/2020   Microscopic polyangiitis ([place]) 03/29/2021   Mixed hyperlipidemia 05/03/2018   Myalgia 04/19/2019   Osteoarthritis    Other emphysema ([place])    Other long term (current) drug therapy 03/29/2021   Paresthesias in left hand 11/30/2019   Peripheral polyneuropathy 01/30/2020   Pneumonia 04/01/2019   Pneumonitis    Pneumonitis    Polymyositis ([place])    Primary insomnia    Proteinuria 09/07/2020   Renal insufficiency 09/06/2020   Renal stones    Rheumatoid factor positive 03/29/2021   Sepsis ([place]) 04/19/2019   Severe sepsis ([place]) 07/07/2020   Skin cancer    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/17/2020   Transaminitis    Trigger finger 05/03/2018   UC (ulcerative colitis) ([place])    Urinary urgency 09/07/2020   UTI (urinary tract infection)    Vasculitis ([place]) 03/29/2021    Allergies:  Allergies  Allergen Reactions   Ace Inhibitors Other (See Comments)    = Slow heart rate   Hydrocodone Nausea And Vomiting   Hydrocodone-Acetaminophen Nausea And Vomiting   Nsaids Other (See Comments)    Kidney issues   Sulfamethoxazole Nausea And Vomiting   Sulfamethoxazole-Trimethoprim Nausea And Vomiting   Trimethoprim Other (See Comments)    Reaction not recalled     MEDICATIONS:  vitamin C  500 mg Oral Daily   aspirin EC  81 mg Oral Daily   benzonatate  200 mg Oral TID   enoxaparin (LOVENOX) injection  85 mg Subcutaneous Q12H   gabapentin  400 mg Oral BID   guaiFENesin  600 mg Oral BID   insulin aspart  0-15 Units Subcutaneous TID AC & HS   insulin aspart  4 Units Subcutaneous TID WC   insulin glargine-yfgn  16 Units Subcutaneous Daily   methylPREDNISolone (SOLU-MEDROL) injection  90 mg Intravenous Daily   rosuvastatin  20 mg Oral QHS   zinc sulfate  220 mg Oral Daily    Social History   Tobacco Use   Smoking status: Never   Smokeless tobacco: Never  Vaping Use   Vaping Use: Never used  Substance Use Topics   Alcohol use: Never   Drug use: Never    Family History  Problem Relation Age of Onset   Tuberculosis Mother    Stroke Father    Pancreatic cancer Sister    Heart attack Sister    Lung disease Sister    Clotting disorder Brother    Colon cancer Neg Hx    Esophageal cancer Neg Hx     Review of Systems -   Constitutional:  Negative for fever, chills, diaphoresis, activity change, appetite change, fatigue and unexpected weight change.  HENT: Negative for congestion, sore throat, rhinorrhea, sneezing, trouble swallowing and sinus pressure.  Eyes: Negative for photophobia and visual disturbance.  Respiratory: positive for cough, shortness of breath, blood tinged sputum, but no wheezing and stridor.  Cardiovascular: Negative for chest pain, palpitations and leg swelling.  Gastrointestinal: Negative for nausea, vomiting, abdominal pain, diarrhea, constipation, blood in stool, abdominal distention and anal bleeding.  Genitourinary: Negative for dysuria, hematuria, flank pain and difficulty urinating.  Musculoskeletal: Negative for myalgias, back pain, joint swelling, arthralgias and gait problem.  Skin: Negative for color change, pallor, rash and wound.  Neurological: Negative for dizziness, tremors, weakness and  light-headedness.  Hematological: Negative for adenopathy. Does not bruise/bleed easily.  Psychiatric/Behavioral: Negative for behavioral problems, confusion, sleep disturbance, dysphoric mood, decreased concentration and agitation.    OBJECTIVE: Temp:  [97.6 F (36.4 C)-98.6 F (37 C)] 98.6 F (37 C) (12/25 0429) Pulse Rate:  [66-100] 72 (12/25 0820) Resp:  [16-26] 20 (12/25 0820) BP: (92-120)/(57-66) 105/57 (12/25 0820) SpO2:  [91 %-100 %] 91 % (12/25 0820) Physical Exam  Constitutional: He is oriented to person, place, and time. He appears well-developed and well-nourished. No distress.  HENT:  Mouth/Throat: Oropharynx is clear and moist. No oropharyngeal exudate.  Cardiovascular: Normal rate, regular rhythm and normal heart sounds. Exam reveals no gallop and no friction rub.  No murmur heard.  Pulmonary/Chest: Effort normal and breath sounds normal. No respiratory distress. He has no wheezes. Decrease at bases Abdominal: Soft. Bowel sounds are normal. He exhibits no distension. There is no tenderness.  Lymphadenopathy:  He has no cervical adenopathy.  Neurological: He is alert and oriented to person, place, and time.  Skin: Skin is warm and dry. No rash noted. No erythema.  Psychiatric: He has a normal mood and affect. His behavior is normal.    LABS: Results for orders placed or performed during the hospital encounter of 08/12/21 (from the past 48 hour(s))  Glucose, capillary     Status: Abnormal   Collection Time: 08/16/21 12:06 PM  Result Value Ref Range   Glucose-Capillary 276 (H) 70 - 99 mg/dL    Comment: Glucose reference range applies only to samples taken after fasting for at least 8 hours.  Glucose, capillary     Status: Abnormal   Collection Time: 08/16/21  4:15 PM  Result Value Ref Range   Glucose-Capillary 385 (H) 70 - 99 mg/dL    Comment: Glucose reference range applies only to samples taken after fasting for at least 8 hours.  Glucose, capillary     Status:  Abnormal   Collection Time: 08/16/21  9:00 PM  Result Value Ref Range   Glucose-Capillary 236 (H) 70 - 99 mg/dL    Comment: Glucose reference range applies only to samples taken after fasting for at least 8 hours.  CBC     Status: Abnormal   Collection Time: 08/17/21  2:04 AM  Result Value Ref Range   WBC 14.7 (H) 4.0 - 10.5 K/uL   RBC 4.19 (L) 4.22 - 5.81 MIL/uL   Hemoglobin 11.8 (L) 13.0 - 17.0 g/dL   HCT 35.0 (L) 39.0 - 52.0 %   MCV 83.5 80.0 - 100.0 fL   MCH 28.2 26.0 - 34.0 pg   MCHC 33.7 30.0 - 36.0 g/dL   RDW 12.8 11.5 - 15.5 %   Platelets 310 150 - 400 K/uL   nRBC 0.2 0.0 - 0.2 %  Comment: Performed at [facility], [place] [address] [place], [place] [address]  Basic metabolic panel     Status: Abnormal   Collection Time: 08/17/21  2:04 AM  Result Value Ref Range   Sodium 134 (L) 135 - 145 mmol/L   Potassium 4.5 3.5 - 5.1 mmol/L   Chloride 105 98 - 111 mmol/L   CO2 22 22 - 32 mmol/L   Glucose, Bld 124 (H) 70 - 99 mg/dL    Comment: Glucose reference range applies only to samples taken after fasting for at least 8 hours.   BUN 29 (H) 8 - 23 mg/dL   Creatinine, Ser 1.28 (H) 0.61 - 1.24 mg/dL   Calcium 7.9 (L) 8.9 - 10.3 mg/dL   GFR, Estimated 56 (L) >60 mL/min    Comment: (NOTE) Calculated using the CKD-EPI Creatinine Equation (2021)    Anion gap 7 5 - 15    Comment: Performed at [facility] [address], [place], [place] [address]  C-reactive protein     Status: Abnormal   Collection Time: 08/17/21  2:04 AM  Result Value Ref Range   CRP 2.9 (H) <1.0 mg/dL    Comment: Performed at [facility] [address], [place], [place] [address]  Glucose, capillary     Status: Abnormal   Collection Time: 08/17/21  8:03 AM  Result Value Ref Range   Glucose-Capillary 183 (H) 70 - 99 mg/dL    Comment: Glucose reference range applies only to samples taken after fasting for at least 8 hours.  D-dimer, quantitative     Status: Abnormal   Collection Time:  08/17/21 11:09 AM  Result Value Ref Range   D-Dimer, Quant 13.74 (H) 0.00 - 0.50 ug/mL-FEU    Comment: (NOTE) At the manufacturer cut-off value of 0.5 g/mL FEU, this assay has a negative predictive value of 95-100%.This assay is intended for use in conjunction with a clinical pretest probability (PTP) assessment model to exclude pulmonary embolism (PE) and deep venous thrombosis (DVT) in outpatients suspected of PE or DVT. Results should be correlated with clinical presentation. Performed at [facility], [place] [address], [place], [place] [address]   Procalcitonin - Baseline     Status: None   Collection Time: 08/17/21 11:09 AM  Result Value Ref Range   Procalcitonin 0.27 ng/mL    Comment:        Interpretation: PCT (Procalcitonin) <= 0.5 ng/mL: Systemic infection (sepsis) is not likely. Local bacterial infection is possible. (NOTE)       Sepsis PCT Algorithm           Lower Respiratory Tract                                      Infection PCT Algorithm    ----------------------------     ----------------------------         PCT < 0.25 ng/mL                PCT < 0.10 ng/mL          Strongly encourage             Strongly discourage   discontinuation of antibiotics    initiation of antibiotics    ----------------------------     -----------------------------       PCT 0.25 - 0.50 ng/mL  PCT 0.10 - 0.25 ng/mL               OR       >80% decrease in PCT            Discourage initiation of                                            antibiotics      Encourage discontinuation           of antibiotics    ----------------------------     -----------------------------         PCT >= 0.50 ng/mL              PCT 0.26 - 0.50 ng/mL               AND        <80% decrease in PCT             Encourage initiation of                                             antibiotics       Encourage continuation           of antibiotics    ----------------------------      -----------------------------        PCT >= 0.50 ng/mL                  PCT > 0.50 ng/mL               AND         increase in PCT                  Strongly encourage                                      initiation of antibiotics    Strongly encourage escalation           of antibiotics                                     -----------------------------                                           PCT <= 0.25 ng/mL                                                 OR                                        > 80% decrease in PCT  Discontinue / Do not initiate                                             antibiotics  Performed at [facility], [place] [address], [place], [place] [address]   Brain natriuretic peptide     Status: Abnormal   Collection Time: 08/17/21 11:09 AM  Result Value Ref Range   B Natriuretic Peptide 130.1 (H) 0.0 - 100.0 pg/mL    Comment: Performed at [facility] [address] [place], [place] [address]  SAR CoV2 Serology (COVID 19)AB(IGG)IA     Status: None   Collection Time: 08/17/21 11:58 AM  Result Value Ref Range   SARS-CoV-2 Ab, IgG NON REACTIVE NON REACTIVE    Comment: HEALTH DEPARTMENT NOTIFIED (NOTE) Non-Reactive for SARS-CoV-2 IgG Antibodies.   SARS-CoV-2 IgG antibodies not detected.  Negative results do not preclude acute SARS-CoV-2 infection.  Negative results may occur in samples collected too soon following infection or in immunosuppressed patients.  Serologic results should not be used to diagnose or exclude active/recent SARS-CoV-2 infection.  If acute infection is suspected, direct testing for SARS-CoV-2 is necessary.     The expected result is Non-Reactive.   Fact Sheet for Recipients:  LimitBuy.nl   Fact Sheet for Healthcare Providers:  WordAgents.no   Testing was performed using the [employer] Coulter SARS-CoV-2 IgG assay.  This test is  not yet approved or cleared by the [place] and has been authorized by FDA under an Emergency Use Authorization (EUA).  This EUA will remain in effect (meaning this test can be used) for the duration of the COVID-19 dec laration under Section 564(b)(1) of the Act, 21 U.S.C. section 360bbb-3(b)(1), unless the authorization is terminated or revoked sooner.  Performed at [facility], [place] [address] [place], [place] [address]   Glucose, capillary     Status: Abnormal   Collection Time: 08/17/21 12:18 PM  Result Value Ref Range   Glucose-Capillary 148 (H) 70 - 99 mg/dL    Comment: Glucose reference range applies only to samples taken after fasting for at least 8 hours.  Glucose, capillary     Status: Abnormal   Collection Time: 08/17/21  4:33 PM  Result Value Ref Range   Glucose-Capillary 243 (H) 70 - 99 mg/dL    Comment: Glucose reference range applies only to samples taken after fasting for at least 8 hours.  Glucose, capillary     Status: Abnormal   Collection Time: 08/17/21  9:12 PM  Result Value Ref Range   Glucose-Capillary 373 (H) 70 - 99 mg/dL    Comment: Glucose reference range applies only to samples taken after fasting for at least 8 hours.  CBC     Status: Abnormal   Collection Time: 08/18/21  1:19 AM  Result Value Ref Range   WBC 12.0 (H) 4.0 - 10.5 K/uL   RBC 4.13 (L) 4.22 - 5.81 MIL/uL   Hemoglobin 11.7 (L) 13.0 - 17.0 g/dL   HCT 34.8 (L) 39.0 - 52.0 %   MCV 84.3 80.0 - 100.0 fL   MCH 28.3 26.0 - 34.0 pg   MCHC 33.6 30.0 - 36.0 g/dL   RDW 12.9 11.5 - 15.5 %   Platelets 320 150 - 400 K/uL   nRBC 0.2 0.0 - 0.2 %    Comment: Performed at [facility], [place] [address] [place], [place] [address]  Comprehensive metabolic panel     Status: Abnormal   Collection Time: 08/18/21  1:19 AM  Result Value Ref Range   Sodium 135 135 - 145 mmol/L   Potassium 4.3 3.5 - 5.1 mmol/L   Chloride 105 98 - 111 mmol/L   CO2 22 22 - 32 mmol/L   Glucose, Bld 185  (H) 70 - 99 mg/dL    Comment: Glucose reference range applies only to samples taken after fasting for at least 8 hours.   BUN 39 (H) 8 - 23 mg/dL   Creatinine, Ser 1.32 (H) 0.61 - 1.24 mg/dL   Calcium 7.8 (L) 8.9 - 10.3 mg/dL   Total Protein 4.9 (L) 6.5 - 8.1 g/dL   Albumin 2.3 (L) 3.5 - 5.0 g/dL   AST 43 (H) 15 - 41 U/L   ALT 82 (H) 0 - 44 U/L   Alkaline Phosphatase 146 (H) 38 - 126 U/L   Total Bilirubin 1.3 (H) 0.3 - 1.2 mg/dL   GFR, Estimated 54 (L) >60 mL/min    Comment: (NOTE) Calculated using the CKD-EPI Creatinine Equation (2021)    Anion gap 8 5 - 15    Comment: Performed at [facility], [place] [address], [place], [place] [address]  Glucose, capillary     Status: Abnormal   Collection Time: 08/18/21  7:37 AM  Result Value Ref Range   Glucose-Capillary 205 (H) 70 - 99 mg/dL    Comment: Glucose reference range applies only to samples taken after fasting for at least 8 hours.    MICRO: Reviewed, not MRSA colonized 12/19 covid PCR +  IMAGING: DG Chest Port 1V same Day  Result Date: 08/17/2021 CLINICAL DATA:  Pneumonia EXAM: PORTABLE CHEST 1 VIEW COMPARISON:  08/16/2021 FINDINGS: Patchy bilateral airspace disease again noted, similar to prior study. Heart is borderline in size. No effusions. No acute bony abnormality. IMPRESSION: Patchy bilateral airspace disease compatible with pneumonia. No real change. Electronically Signed   By: Rolm Baptise M.D.   On: 08/17/2021 10:19   DG Chest Port 1V same Day  Result Date: 08/16/2021 CLINICAL DATA:  Short of breath EXAM: PORTABLE CHEST 1 VIEW COMPARISON:  Chest x-ray 08/12/2021.  Chest CT 08/12/2021 FINDINGS: Patchy bilateral infiltrates with mild progression. No pleural effusion Heart size enlarged.  No vascular congestion. IMPRESSION: Progression of patchy bilateral infiltrates compatible with pneumonia. Correlate with COVID status. Electronically Signed   By: Franchot Gallo M.D.   On: 08/16/2021 14:51   VAS [place] LOWER EXTREMITY  VENOUS (DVT)  Result Date: 08/17/2021  Lower Venous DVT Study Patient Name:  Kerry Matthews  Date of Exam:   08/17/2021 Medical Rec #: [id number]       Accession #:    [id number] Date of Birth: 1935/12/04       Patient Gender: M Patient Age:   19 years Exam Location:  [facility] Procedure:      VAS [place] LOWER EXTREMITY VENOUS (DVT) Referring Phys: Oren Binet --------------------------------------------------------------------------------  Indications: Hypoxia.  Risk Factors: COVID 19 positive. Limitations: Poor ultrasound/tissue interface and patient positioning. Comparison Study: No prior studies. Performing Technologist: Oliver Hum RVT  Examination Guidelines: A complete evaluation includes B-mode imaging, spectral Doppler, color Doppler, and power Doppler as needed of all accessible portions of each vessel. Bilateral testing is considered an integral part of a complete examination. Limited examinations for reoccurring indications may be performed as noted. The reflux portion of the exam  is performed with the patient in reverse Trendelenburg.  +---------+---------------+---------+-----------+----------+--------------+  RIGHT     Compressibility Phasicity Spontaneity Properties Thrombus Aging  +---------+---------------+---------+-----------+----------+--------------+  CFV       Full            Yes       Yes                                    +---------+---------------+---------+-----------+----------+--------------+  SFJ       Full                                                             +---------+---------------+---------+-----------+----------+--------------+  FV Prox   Full                                                             +---------+---------------+---------+-----------+----------+--------------+  FV Mid    Full                                                             +---------+---------------+---------+-----------+----------+--------------+  FV Distal Full                                                              +---------+---------------+---------+-----------+----------+--------------+  PFV       Full                                                             +---------+---------------+---------+-----------+----------+--------------+  POP       Full            Yes       Yes                                    +---------+---------------+---------+-----------+----------+--------------+  PTV       Full                                                             +---------+---------------+---------+-----------+----------+--------------+  PERO      Full                                                             +---------+---------------+---------+-----------+----------+--------------+   +---------+---------------+---------+-----------+----------+--------------+  LEFT      Compressibility Phasicity Spontaneity Properties Thrombus Aging  +---------+---------------+---------+-----------+----------+--------------+  CFV       Full            Yes       Yes                                    +---------+---------------+---------+-----------+----------+--------------+  SFJ       Full                                                             +---------+---------------+---------+-----------+----------+--------------+  FV Prox   Full                                                             +---------+---------------+---------+-----------+----------+--------------+  FV Mid    Full                                                             +---------+---------------+---------+-----------+----------+--------------+  FV Distal Full                                                             +---------+---------------+---------+-----------+----------+--------------+  PFV       Full                                                             +---------+---------------+---------+-----------+----------+--------------+  POP       Full            Yes       Yes                                     +---------+---------------+---------+-----------+----------+--------------+  PTV       Full                                                             +---------+---------------+---------+-----------+----------+--------------+  PERO      Full                                                             +---------+---------------+---------+-----------+----------+--------------+  Summary: RIGHT: - There is no evidence of deep vein thrombosis in the lower extremity.  - No cystic structure found in the popliteal fossa.  LEFT: - There is no evidence of deep vein thrombosis in the lower extremity.  - No cystic structure found in the popliteal fossa.  *See table(s) above for measurements and observations.    Preliminary      Assessment/Plan:  85yo M with ILD 2/2 microscopic polyangiitis on rituximab, considered immunocompromised host, has not developed ab despite covid vaccine, who was dx with covid on 12/5 tx with antiviral but continued to worsen. Unfortunately bebtelovimab is no longer effective in circulating covid-19 variants. Unclear if he ever received evusheld.   There still maybe some utility, though marginal, from giving high titer convalescent plasma to help clear viral replication. Would recommend to arrange for CP infusion  Evusheld is FDA approve for pre-exposure proph but there has been studies on giving it to hospitalized patients with mild-moderate disease and non-hospitalized pt as part of treatment. ACTIV-3 study-   Agree that repeat chest CT to exclude PE would be useful.  -continue with cefepime for now.

## 2021-08-18 NOTE — Plan of Care (Signed)
°  Problem: Health Behavior/Discharge Planning: Goal: Ability to manage health-related needs will improve Outcome: Progressing   Problem: Clinical Measurements: Goal: Ability to maintain clinical measurements within normal limits will improve Outcome: Progressing Goal: Will remain free from infection Outcome: Progressing Goal: Respiratory complications will improve Outcome: Progressing   Problem: Activity: Goal: Risk for activity intolerance will decrease Outcome: Progressing   Problem: Elimination: Goal: Will not experience complications related to bowel motility Outcome: Progressing Goal: Will not experience complications related to urinary retention Outcome: Progressing   Problem: Safety: Goal: Ability to remain free from injury will improve Outcome: Progressing   Problem: Education: Goal: Knowledge of risk factors and measures for prevention of condition will improve Outcome: Progressing   Problem: Coping: Goal: Psychosocial and spiritual needs will be supported Outcome: Progressing   Problem: Respiratory: Goal: Complications related to the disease process, condition or treatment will be avoided or minimized Outcome: Progressing

## 2021-08-18 NOTE — Progress Notes (Signed)
°  Echocardiogram 2D Echocardiogram has been performed.  Kerry Matthews 08/18/2021, 9:52 AM

## 2021-08-19 DIAGNOSIS — J189 Pneumonia, unspecified organism: Secondary | ICD-10-CM | POA: Diagnosis not present

## 2021-08-19 DIAGNOSIS — E1142 Type 2 diabetes mellitus with diabetic polyneuropathy: Secondary | ICD-10-CM | POA: Diagnosis not present

## 2021-08-19 DIAGNOSIS — N1831 Chronic kidney disease, stage 3a: Secondary | ICD-10-CM | POA: Diagnosis not present

## 2021-08-19 DIAGNOSIS — E871 Hypo-osmolality and hyponatremia: Secondary | ICD-10-CM | POA: Diagnosis not present

## 2021-08-19 LAB — MAGNESIUM: Magnesium: 2.6 mg/dL — ABNORMAL HIGH (ref 1.7–2.4)

## 2021-08-19 LAB — COMPREHENSIVE METABOLIC PANEL
ALT: 81 U/L — ABNORMAL HIGH (ref 0–44)
AST: 55 U/L — ABNORMAL HIGH (ref 15–41)
Albumin: 2.1 g/dL — ABNORMAL LOW (ref 3.5–5.0)
Alkaline Phosphatase: 127 U/L — ABNORMAL HIGH (ref 38–126)
Anion gap: 10 (ref 5–15)
BUN: 42 mg/dL — ABNORMAL HIGH (ref 8–23)
CO2: 21 mmol/L — ABNORMAL LOW (ref 22–32)
Calcium: 7.7 mg/dL — ABNORMAL LOW (ref 8.9–10.3)
Chloride: 104 mmol/L (ref 98–111)
Creatinine, Ser: 1.22 mg/dL (ref 0.61–1.24)
GFR, Estimated: 59 mL/min — ABNORMAL LOW (ref >60)
Glucose, Bld: 171 mg/dL — ABNORMAL HIGH (ref 70–99)
Potassium: 4.8 mmol/L (ref 3.5–5.1)
Sodium: 135 mmol/L (ref 135–145)
Total Bilirubin: 1.1 mg/dL (ref 0.3–1.2)
Total Protein: 4.7 g/dL — ABNORMAL LOW (ref 6.5–8.1)

## 2021-08-19 LAB — GLUCOSE, CAPILLARY
Glucose-Capillary: 173 mg/dL — ABNORMAL HIGH (ref 70–99)
Glucose-Capillary: 244 mg/dL — ABNORMAL HIGH (ref 70–99)
Glucose-Capillary: 332 mg/dL — ABNORMAL HIGH (ref 70–99)
Glucose-Capillary: 333 mg/dL — ABNORMAL HIGH (ref 70–99)

## 2021-08-19 LAB — D-DIMER, QUANTITATIVE: D-Dimer, Quant: 1.97 ug/mL-FEU — ABNORMAL HIGH (ref 0.00–0.50)

## 2021-08-19 LAB — CBC
HCT: 34.9 % — ABNORMAL LOW (ref 39.0–52.0)
Hemoglobin: 11.9 g/dL — ABNORMAL LOW (ref 13.0–17.0)
MCH: 29.2 pg (ref 26.0–34.0)
MCHC: 34.1 g/dL (ref 30.0–36.0)
MCV: 85.7 fL (ref 80.0–100.0)
Platelets: 383 10*3/uL (ref 150–400)
RBC: 4.07 MIL/uL — ABNORMAL LOW (ref 4.22–5.81)
RDW: 13.2 % (ref 11.5–15.5)
WBC: 17.2 10*3/uL — ABNORMAL HIGH (ref 4.0–10.5)
nRBC: 0.1 % (ref 0.0–0.2)

## 2021-08-19 LAB — C-REACTIVE PROTEIN: CRP: 6.5 mg/dL — ABNORMAL HIGH (ref <1.0)

## 2021-08-19 MED ORDER — ENOXAPARIN SODIUM 40 MG/0.4ML IJ SOSY
40.0000 mg | PREFILLED_SYRINGE | Freq: Two times a day (BID) | INTRAMUSCULAR | Status: DC
Start: 2021-08-19 — End: 2021-08-21
  Administered 2021-08-19 – 2021-08-21 (×4): 40 mg via SUBCUTANEOUS
  Filled 2021-08-19 (×4): qty 0.4

## 2021-08-19 NOTE — Progress Notes (Signed)
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  Patient was admitted-treated with steroids and broad-spectrum antibiotics-hypoxia gradually improved-and was only requiring 2-3 L of oxygen.  Once steroid dosage was further de-escalated-patient was noted to have significant worsening of his hypoxemia.  PCCM/ID was consulted-see below for further details.  Significant events: 12/05>> COVID-19 positive on home testing 12/19>> admit for shortness of breath-COVID-19 PCR positive.  CT value of 28.6. 12/24>> worsening hypoxemia-started on salter high flow.  PCCM consulted. 12/24>> SARS-COV-2-Ab IgG nonreactive (s/p 3 vaccines, natural infection) 12/25>> remains on salter high flow- 10-15 L, ID consulted-convalescent plasma ordered.  CTA chest negative for PE   Subjective: No major issues overnight-required 15 L of salter high flow with movement, down to 9 L at rest.  Coughing spells better  Objective: Vitals: Blood pressure 130/66, pulse 74, temperature 97.6 F (36.4 C), temperature source Oral, resp. rate 19, height 5\' 7"  (1.702 m), weight 85.7 kg, SpO2 96 %.   Physical exam: Gen Exam:Alert awake-not in any distress HEENT:atraumatic, normocephalic Chest: B/L clear to auscultation anteriorly CVS:S1S2 regular Abdomen:soft non tender, non distended Extremities:no edema Neurology: Non focal Skin: no  rash    Recent Labs    08/17/21 0204 08/17/21 1109 08/19/21 0148  DDIMER  --  13.74* 1.97*  CRP 2.9*  --  6.5*     Assessment & Plan: Acute hypoxic respiratory failure due to multifocal pneumonia in a immunocompromised patient (history of microscopic polyangiitis on Rituxan infusion every 6 months) with recent COVID-19 infection: Continues to have severe hypoxemia-etiology felt to be due to ongoing COVID-19 pneumonitis.  Back on IV steroids-as noted to have worsening hypoxemia on 12/24 after steroid dosage was de-escalated.  Has completed 7 days of IV cefepime.  Repeat CTA chest on 12/25/Dopplers negative for VTE.  Awaiting convalescent plasma infusion-Per bloodline-will arrive on 12/27 morning.  In the interim-continue IV steroids-and other supportive care.  Continue to attempt to titrate down FiO2 as tolerated.  Volume status is stable-do not think patient requires diuretics.   History of ILD/microscopic polyangiitis: On Rituxan every 6 months (last dose June 2022)-we will schedule to get a injection earlier this month-however has now been postponed due to COVID-19 diagnoses.  See above.  Elevated D-dimer: Likely due to COVID-19 infection associated inflammation-lower extremity Doppler on 12/24 negative for DVT-CTA chest on 12/25 negative for VTE.  Was on empiric therapeutic anticoagulation with Lovenox-have switched to intermediate dosing for now.   Leukocytosis: Likely due to steroids-cultures negative so far-continue to monitor.  CKD stage IIIa: Creatinine at baseline-watch closely.   Hyponatremia: Mild-improved-monitor periodically.  CAD: No anginal symptoms-continue aspirin/statin.  HLD: Continue statin  DM-2 (A1c 6.3 on 12/14) with uncontrolled hyperglycemia due to steroids: CBGs higher side-continue Semglee 22 units, 8 units of Premeal NovoLog, SSI-reassess on 12/27.     Recent Labs    08/18/21 2100 08/19/21 0757 08/19/21 1234  GLUCAP 234* 173* 244*     Peripheral  neuropathy:  Likely due to DM-continue Neurontin  HLD: Continue statin  GERD: Continue PPI  DVT prophylaxis: Lovenox at intermediate dosing. Code Status: DNR Family Communication: Daughter at bedside. Disposition:   Status is: Inpatient  Remains inpatient appropriate because: IV ABX and steroids-if clinical improvement continues-Home in the next few days.    Consultants:  none   Data Reviewed: I have personally reviewed following labs and imaging studies  CBC: Recent Labs  Lab 08/13/21 0419 08/14/21 0101 08/15/21 0051 08/17/21 0204 08/18/21 0119 08/19/21 0148  WBC 5.7 15.2* 15.6* 14.7* 12.0* 17.2*  NEUTROABS 5.1  --   --   --   --   --   HGB 13.3 13.1 11.7* 11.8* 11.7* 11.9*  HCT 40.7 36.6* 33.6* 35.0* 34.8* 34.9*  MCV 85.7 81.5 82.2 83.5 84.3 85.7  PLT 190 252 281 310 320 383     Basic Metabolic Panel: Recent Labs  Lab 08/13/21 0419 08/14/21 0101 08/15/21 0051 08/17/21 0204 08/18/21 0119 08/19/21 0148 08/19/21 0435  NA 130* 130* 131* 134* 135 135  --   K 4.4 4.0 4.4 4.5 4.3 4.8  --   CL 101 103 102 105 105 104  --   CO2 20* 18* 20* 22 22 21*  --   GLUCOSE 202* 148* 168* 124* 185* 171*  --   BUN 16 20 25* 29* 39* 42*  --   CREATININE 1.11 1.04 1.20 1.28* 1.32* 1.22  --   CALCIUM 7.7* 7.9* 7.9* 7.9* 7.8* 7.7*  --   MG 1.9  --   --   --   --   --  2.6*     GFR: Estimated Creatinine Clearance: 48 mL/min (by C-G formula based on SCr of 1.22 mg/dL).  Liver Function Tests: Recent Labs  Lab 08/13/21 0419 08/18/21 0119 08/19/21 0148  AST 28 43* 55*  ALT 20 82* 81*  ALKPHOS 105 146* 127*  BILITOT 1.2 1.3* 1.1  PROT 5.1* 4.9* 4.7*  ALBUMIN 2.5* 2.3* 2.1*     CBG: Recent Labs  Lab 08/18/21 1144 08/18/21 1623 08/18/21 2100 08/19/21 0757 08/19/21 1234  GLUCAP 245* 184* 234* 173* 244*      Recent Results (from the past 240 hour(s))  Resp Panel by RT-PCR (Flu A&B, Covid) Nasopharyngeal Swab     Status: Abnormal   Collection Time: 08/12/21  11:11 AM   Specimen: Nasopharyngeal Swab; Nasopharyngeal(NP) swabs in vial transport medium  Result Value Ref Range Status   SARS Coronavirus 2 by RT PCR POSITIVE (A) NEGATIVE Final    Comment: (NOTE) SARS-CoV-2 target nucleic acids are DETECTED.  The SARS-CoV-2 RNA is generally detectable in upper respiratory specimens during the acute phase of infection. Positive results are indicative of the presence of the identified virus, but do not rule out bacterial infection or co-infection with other pathogens not detected by the test. Clinical correlation with patient history and other diagnostic information is necessary to determine patient infection status. The expected result is Negative.  Fact Sheet for Patients: EntrepreneurPulse.com.au  Fact Sheet for Healthcare Providers: IncredibleEmployment.be  This test is not yet approved or cleared by the [place] FDA and  has been authorized for detection and/or diagnosis of SARS-CoV-2 by FDA under an Emergency Use Authorization (EUA).  This EUA will remain in effect (meaning this test can be used) for the duration of  the COVID-19 declaration under Section 564(b)(1) of the A ct, 21 U.S.C. section 360bbb-3(b)(1), unless the authorization is terminated or revoked sooner.     Influenza  A by PCR NEGATIVE NEGATIVE Final   Influenza B by PCR NEGATIVE NEGATIVE Final    Comment: (NOTE) The Xpert Xpress SARS-CoV-2/FLU/RSV plus assay is intended as an aid in the diagnosis of influenza from Nasopharyngeal swab specimens and should not be used as a sole basis for treatment. Nasal washings and aspirates are unacceptable for Xpert Xpress SARS-CoV-2/FLU/RSV testing.  Fact Sheet for Patients: EntrepreneurPulse.com.au  Fact Sheet for Healthcare Providers: IncredibleEmployment.be  This test is not yet approved or cleared by the [place] FDA and has been authorized for  detection and/or diagnosis of SARS-CoV-2 by FDA under an Emergency Use Authorization (EUA). This EUA will remain in effect (meaning this test can be used) for the duration of the COVID-19 declaration under Section 564(b)(1) of the Act, 21 U.S.C. section 360bbb-3(b)(1), unless the authorization is terminated or revoked.  Performed at [facility], [place] [address], [place], [place] [address]   Culture, blood (routine x 2)     Status: None   Collection Time: 08/12/21  9:34 PM   Specimen: BLOOD LEFT HAND  Result Value Ref Range Status   Specimen Description BLOOD LEFT HAND  Final   Special Requests   Final    BOTTLES DRAWN AEROBIC AND ANAEROBIC Blood Culture adequate volume   Culture   Final    NO GROWTH 5 DAYS Performed at [facility], [place] [address] [place], [place] [address]    Report Status 08/17/2021 FINAL  Final  Respiratory (~20 pathogens) panel by PCR     Status: None   Collection Time: 08/12/21  9:35 PM   Specimen: Nasopharyngeal Swab; Respiratory  Result Value Ref Range Status   Adenovirus NOT DETECTED NOT DETECTED Final   Coronavirus 229E NOT DETECTED NOT DETECTED Final    Comment: (NOTE) The Coronavirus on the Respiratory Panel, DOES NOT test for the novel  Coronavirus (2019 nCoV)    Coronavirus HKU1 NOT DETECTED NOT DETECTED Final   Coronavirus NL63 NOT DETECTED NOT DETECTED Final   Coronavirus OC43 NOT DETECTED NOT DETECTED Final   Metapneumovirus NOT DETECTED NOT DETECTED Final   Rhinovirus / Enterovirus NOT DETECTED NOT DETECTED Final   Influenza A NOT DETECTED NOT DETECTED Final   Influenza B NOT DETECTED NOT DETECTED Final   Parainfluenza Virus 1 NOT DETECTED NOT DETECTED Final   Parainfluenza Virus 2 NOT DETECTED NOT DETECTED Final   Parainfluenza Virus 3 NOT DETECTED NOT DETECTED Final   Parainfluenza Virus 4 NOT DETECTED NOT DETECTED Final   Respiratory Syncytial Virus NOT DETECTED NOT DETECTED Final   Bordetella pertussis NOT DETECTED NOT  DETECTED Final   Bordetella Parapertussis NOT DETECTED NOT DETECTED Final   Chlamydophila pneumoniae NOT DETECTED NOT DETECTED Final   Mycoplasma pneumoniae NOT DETECTED NOT DETECTED Final    Comment: Performed at [facility], [place] [address] [place], [place] [address]  Culture, blood (routine x 2)     Status: None   Collection Time: 08/12/21  9:39 PM   Specimen: BLOOD RIGHT HAND  Result Value Ref Range Status   Specimen Description BLOOD RIGHT HAND  Final   Special Requests   Final    BOTTLES DRAWN AEROBIC AND ANAEROBIC Blood Culture adequate volume   Culture   Final    NO GROWTH 5 DAYS Performed at [facility], [place] [address], [place], [place] [address]    Report Status 08/17/2021 FINAL  Final  MRSA Next Gen by PCR, Nasal     Status:  None   Collection Time: 08/12/21 10:42 PM   Specimen: Nasal Mucosa; Nasal Swab  Result Value Ref Range Status   MRSA by PCR Next Gen NOT DETECTED NOT DETECTED Final    Comment: (NOTE) The GeneXpert MRSA Assay (FDA approved for NASAL specimens only), is one component of a comprehensive MRSA colonization surveillance program. It is not intended to diagnose MRSA infection nor to guide or monitor treatment for MRSA infections. Test performance is not FDA approved in patients less than 25 years old. Performed at [facility], [place] [address] [place], [place] [address]           Radiology Studies: CT Angio Chest Pulmonary Embolism (PE) W or WO Contrast  Result Date: 08/18/2021 CLINICAL DATA:  PE suspected, positive D-dimer EXAM: CT ANGIOGRAPHY CHEST WITH CONTRAST TECHNIQUE: Multidetector CT imaging of the chest was performed using the standard protocol during bolus administration of intravenous contrast. Multiplanar CT image reconstructions and MIPs were obtained to evaluate the vascular anatomy. CONTRAST:  59mL OMNIPAQUE IOHEXOL 350 MG/ML SOLN COMPARISON:  08/12/2021, 10/14/2019 FINDINGS: Cardiovascular:  Satisfactory opacification of the pulmonary arteries to the segmental level. No evidence of pulmonary embolism. Cardiomegaly. Three-vessel coronary artery calcifications. Enlargement of the main pulmonary artery, measuring up to 4.0 cm in caliber. No pericardial effusion. Mediastinum/Nodes: Unchanged prominent mediastinal lymph nodes thyroid gland, trachea, and esophagus demonstrate no significant findings. Lungs/Pleura: There is extensive, somewhat geographic bilateral heterogeneous and ground-glass airspace opacity with superimposed interlobular septal thickening. Fibrotic change and subpleural bronchiolectasis at the dependent bilateral lung bases (series 7, image 62). Trace bilateral pleural effusions. Upper Abdomen: No acute abnormality. Musculoskeletal: No chest wall abnormality. No acute or significant osseous findings. Review of the MIP images confirms the above findings. IMPRESSION: 1. Negative examination for pulmonary embolism. 2. Extensive, somewhat geographic bilateral heterogeneous and ground-glass airspace opacity with superimposed interlobular septal thickening, somewhat worsened compared to prior examination. Trace bilateral pleural effusions. Findings are most consistent with multifocal infection, including COVID-19 if clinically suspected. 3. Fibrotic change and subpleural bronchiolectasis at the dependent bilateral lung bases, in keeping with UIP pattern of fibrosis seen on prior CT dated 10/14/2019 although otherwise not well characterized in the setting of acute airspace disease. 4. Cardiomegaly and coronary artery disease. 5. Enlargement of the main pulmonary artery, as can be seen in pulmonary hypertension. Electronically Signed   By: Delanna Ahmadi M.D.   On: 08/18/2021 12:58   ECHOCARDIOGRAM LIMITED  Result Date: 08/18/2021    ECHOCARDIOGRAM LIMITED REPORT   Patient Name:   [facility] Date of Exam: 08/18/2021 Medical Rec #:  [id number]      Height:       67.0 in Accession #:     [id number]     Weight:       189.0 lb Date of Birth:  03-26-1936      BSA:          1.974 m Patient Age:    17 years       BP:           120/61 mmHg Patient Gender: M              HR:           83 bpm. Exam Location:  Inpatient Procedure: Limited Echo, Cardiac Doppler and Color Doppler Indications:    Dyspnea  History:        Patient has prior history of Echocardiogram examinations, most  recent 04/04/2019. CAD; Risk Factors:Hypertension and                 Dyslipidemia. CKD. COVID-19.  Sonographer:    Clayton Lefort RDCS (AE) Referring Phys: [place] Comments: No subcostal window. COVID-19. IMPRESSIONS  1. Left ventricular ejection fraction, by estimation, is 65 to 70%. The left ventricle has normal function. The left ventricle has no regional wall motion abnormalities. There is severe concentric left ventricular hypertrophy. Left ventricular diastolic  parameters are indeterminate.  2. Right ventricular systolic function is normal. The right ventricular size is normal. Tricuspid regurgitation signal is inadequate for assessing PA pressure.  3. The mitral valve is grossly normal. Mild mitral valve regurgitation.  4. The aortic valve is tricuspid. There is mild calcification of the aortic valve. Aortic valve regurgitation is mild. Aortic valve sclerosis/calcification is present, without any evidence of aortic stenosis. Aortic valve mean gradient measures 3.0 mmHg.  5. The inferior vena cava is normal in size with greater than 50% respiratory variability, suggesting right atrial pressure of 3 mmHg. Comparison(s): Prior images reviewed side by side. LVEF remains normal range. Mild mitral regurgitation. FINDINGS  Left Ventricle: Left ventricular ejection fraction, by estimation, is 65 to 70%. The left ventricle has normal function. The left ventricle has no regional wall motion abnormalities. The left ventricular internal cavity size was normal in size. There is  severe concentric  left ventricular hypertrophy. Left ventricular diastolic parameters are indeterminate. Right Ventricle: The right ventricular size is normal. No increase in right ventricular wall thickness. Right ventricular systolic function is normal. Tricuspid regurgitation signal is inadequate for assessing PA pressure. Mitral Valve: The mitral valve is grossly normal. Mild mitral valve regurgitation. Aortic Valve: The aortic valve is tricuspid. There is mild calcification of the aortic valve. There is mild aortic valve annular calcification. Aortic valve regurgitation is mild. Aortic regurgitation PHT measures 466 msec. Aortic valve sclerosis/calcification is present, without any evidence of aortic stenosis. Aortic valve mean gradient measures 3.0 mmHg. Aortic valve peak gradient measures 6.2 mmHg. Aortic valve area, by VTI measures 3.32 cm. Pulmonic Valve: The pulmonic valve was grossly normal. Pulmonic valve regurgitation is trivial. Aorta: The aortic root is normal in size and structure. Venous: The inferior vena cava is normal in size with greater than 50% respiratory variability, suggesting right atrial pressure of 3 mmHg. LEFT VENTRICLE PLAX 2D LVIDd:         4.50 cm   Diastology LVIDs:         2.80 cm   LV e' medial:    10.00 cm/s LV PW:         1.80 cm   LV E/e' medial:  5.8 LV IVS:        1.80 cm   LV e' lateral:   10.40 cm/s LVOT diam:     2.40 cm   LV E/e' lateral: 5.5 LV SV:         85 LV SV Index:   43 LVOT Area:     4.52 cm  LEFT ATRIUM         Index LA diam:    4.80 cm 2.43 cm/m  AORTIC VALVE AV Area (Vmax):    3.35 cm AV Area (Vmean):   3.27 cm AV Area (VTI):     3.32 cm AV Vmax:           124.00 cm/s AV Vmean:          84.700 cm/s AV VTI:  0.255 m AV Peak Grad:      6.2 mmHg AV Mean Grad:      3.0 mmHg LVOT Vmax:         91.70 cm/s LVOT Vmean:        61.300 cm/s LVOT VTI:          0.187 m LVOT/AV VTI ratio: 0.73 AI PHT:            466 msec  AORTA Ao Root diam: 3.50 cm Ao Asc diam:  3.70 cm  MITRAL VALVE MV Area (PHT): 2.43 cm    SHUNTS MV Decel Time: 312 msec    Systemic VTI:  0.19 m MV E velocity: 57.60 cm/s  Systemic Diam: 2.40 cm MV A velocity: 75.50 cm/s MV E/A ratio:  0.76 Rozann Lesches MD Electronically signed by Rozann Lesches MD Signature Date/Time: 08/18/2021/1:03:04 PM    Final         Scheduled Meds:  sodium chloride   Intravenous Once   acetaminophen  650 mg Oral Once   vitamin C  500 mg Oral Daily   aspirin EC  81 mg Oral Daily   benzonatate  200 mg Oral TID   diphenhydrAMINE  25 mg Intravenous Once   enoxaparin (LOVENOX) injection  40 mg Subcutaneous Q12H   gabapentin  400 mg Oral BID   guaiFENesin  600 mg Oral BID   insulin aspart  0-15 Units Subcutaneous TID AC & HS   insulin aspart  8 Units Subcutaneous TID WC   insulin glargine-yfgn  22 Units Subcutaneous Daily   methylPREDNISolone (SOLU-MEDROL) injection  90 mg Intravenous Daily   pantoprazole  40 mg Oral Q1200   rosuvastatin  20 mg Oral QHS   zinc sulfate  220 mg Oral Daily   Continuous Infusions:     LOS: 7 days       Oren Binet, MD [facility]   To contact the attending provider between 7A-7P or the covering provider during after hours 7P-7A, please log into the web site [email or web address] and access using universal [place] password for that web site. If you do not have the password, please call the hospital operator.  08/19/2021, 12:58 PM

## 2021-08-19 NOTE — TOC Progression Note (Signed)
Transition of Care [facility]) - Progression Note    Patient Details  Name: Kerry Matthews MRN: [id number] Date of Birth: 1936/02/03  Transition [facility]) CM/SW [place], RN Phone Number: 08/19/2021, 4:23 PM  Clinical Narrative:    Order for home 02. Spoke to Alden with [employer] and oxygen will be delivered to the room prior to discharge.     Expected Discharge Plan: Home/Self Care Barriers to Discharge: Continued Medical Work up  Expected Discharge Plan and Services Expected Discharge Plan: Home/Self Care     Post Acute Care Choice: Durable Medical Equipment Living arrangements for the past 2 months: Single Family Home                 DME Arranged: Oxygen DME Agency: [employer] Date DME Agency Contacted: 08/19/21 Time DME Agency Contacted: 1622 Representative spoke with at DME Agency: [place] ([place]) Interventions    Readmission Risk Interventions Readmission Risk Prevention Plan 07/09/2020  Post Dischage Appt Complete  Medication Screening Complete  Transportation Screening Complete  Some recent data might be hidden

## 2021-08-19 NOTE — Progress Notes (Signed)
Physical Therapy Treatment Patient Details Name: Kerry Matthews MRN: [id number] DOB: 1936/06/13 Today's Date: 08/19/2021   History of Present Illness 85 y.o. M admitted 08/12/2021 with multifocal PNA. COVID-19 (+) 12/5. PMHx: ILD, pulmonary fibrosis, HTN, HLD, DM2, diabetic polyneuropathy,coronary artery disease, chronic LBP.    PT Comments    Pt supine in bed, reports not feeling well but eventually agreeable to walking. Pt requiring min A and RW for mobility today. Limited by increased O2 demand. Given decrease in pt endurance, pt changing recommendations to HHPT at discharge. PT will continue to follow acutely  At rest on 8L O2 via HFNC SpO2 92% O2 With ambulation on 10 L O2 via HFNC SpO2 78%O2 Requires 15 LO2 via HFNC to regain SpO2 92% Able to wean back to 8L O2 via HFNC and retain SpO2 92%O2       Recommendations for follow up therapy are one component of a multi-disciplinary discharge planning process, led by the attending physician.  Recommendations may be updated based on patient status, additional functional criteria and insurance authorization.  Follow Up Recommendations  Home health PT     Assistance Recommended at Discharge Intermittent Supervision/Assistance  Equipment Recommendations  BSC/3in1    Recommendations for Other Services       Precautions / Restrictions Precautions Precautions: Other (comment) (watch O2) Precaution Comments: watch O2 Restrictions Weight Bearing Restrictions: No     Mobility  Bed Mobility Overal bed mobility: Needs Assistance Bed Mobility: Supine to Sit;Sit to Supine     Supine to sit: Min assist Sit to supine: Min guard;HOB elevated   General bed mobility comments: vc for reaching to bedrail to assist in pulling to upright, ultimately grabbing PT hand to pull against to come to seated EoB. Pt able to return LE to bed however requires assist to bring hips up in bed with pad    Transfers Overall transfer level: Needs  assistance   Transfers: Sit to/from Stand Sit to Stand: Min assist           General transfer comment: min A for steadying    Ambulation/Gait Ambulation/Gait assistance: Min assist;Min guard Gait Distance (Feet): 40 Feet Assistive device: Rolling walker (2 wheels) Gait Pattern/deviations: Step-through pattern;Decreased stride length;Trunk flexed;Shuffle Gait velocity: decreased Gait velocity interpretation: <1.31 ft/sec, indicative of household ambulator   General Gait Details: pt intially refusing to use AD, however reluctantly agrees. increased weakness and oxygen desaturation limiting ambulation distance          Balance Overall balance assessment: Mild deficits observed, not formally tested                                          Cognition Arousal/Alertness: Awake/alert Behavior During Therapy: WFL for tasks assessed/performed Overall Cognitive Status: Within Functional Limits for tasks assessed                                 General Comments: required frequent cues for proper breathing technique while wearing O2        Exercises General Exercises - Lower Extremity Long Arc Quad: AROM;Both;Seated;20 reps Hip Flexion/Marching: Both;Seated;AROM;20 reps    General Comments General comments (skin integrity, edema, etc.): desaturation with ambulation requiring increased O2 and 20 min to recover back to pre ambulation O2 levels      Pertinent Vitals/Pain Pain Assessment: Faces Faces  Pain Scale: Hurts little more Pain Location: chest with coughing     PT Goals (current goals can now be found in the care plan section) Acute Rehab PT Goals Patient Stated Goal: not have to wear oxygen PT Goal Formulation: With patient/family Time For Goal Achievement: 08/28/21 Potential to Achieve Goals: Good Progress towards PT goals: Not progressing toward goals - comment (limited by O2 desaturation)    Frequency    Min 3X/week       PT Plan Current plan remains appropriate       AM-PAC PT "6 Clicks" Mobility   Outcome Measure  Help needed turning from your back to your side while in a flat bed without using bedrails?: None Help needed moving from lying on your back to sitting on the side of a flat bed without using bedrails?: None Help needed moving to and from a bed to a chair (including a wheelchair)?: A Little Help needed standing up from a chair using your arms (e.g., wheelchair or bedside chair)?: A Little Help needed to walk in hospital room?: A Little Help needed climbing 3-5 steps with a railing? : A Little 6 Click Score: 20    End of Session Equipment Utilized During Treatment: Oxygen Activity Tolerance: Patient tolerated treatment well;Treatment limited secondary to medical complications (Comment) (oxygen desaturation) Patient left: with call bell/phone within reach;with family/visitor present;in bed Nurse Communication: Mobility status PT Visit Diagnosis: Difficulty in walking, not elsewhere classified (R26.2);Other abnormalities of gait and mobility (R26.89)     Time: 0300-9233 PT Time Calculation (min) (ACUTE ONLY): 40 min  Charges:  $Therapeutic Exercise: 8-22 mins $Therapeutic Activity: 23-37 mins                     Aliscia Clayton B. Migdalia Dk PT, DPT Acute Rehabilitation Services Pager [phone] Office [phone]    [place] 08/19/2021, 4:02 PM

## 2021-08-20 DIAGNOSIS — J189 Pneumonia, unspecified organism: Secondary | ICD-10-CM | POA: Diagnosis not present

## 2021-08-20 DIAGNOSIS — N1831 Chronic kidney disease, stage 3a: Secondary | ICD-10-CM | POA: Diagnosis not present

## 2021-08-20 DIAGNOSIS — E871 Hypo-osmolality and hyponatremia: Secondary | ICD-10-CM | POA: Diagnosis not present

## 2021-08-20 DIAGNOSIS — E1142 Type 2 diabetes mellitus with diabetic polyneuropathy: Secondary | ICD-10-CM | POA: Diagnosis not present

## 2021-08-20 LAB — COMPREHENSIVE METABOLIC PANEL
ALT: 91 U/L — ABNORMAL HIGH (ref 0–44)
AST: 58 U/L — ABNORMAL HIGH (ref 15–41)
Albumin: 2.2 g/dL — ABNORMAL LOW (ref 3.5–5.0)
Alkaline Phosphatase: 144 U/L — ABNORMAL HIGH (ref 38–126)
Anion gap: 7 (ref 5–15)
BUN: 34 mg/dL — ABNORMAL HIGH (ref 8–23)
CO2: 22 mmol/L (ref 22–32)
Calcium: 7.8 mg/dL — ABNORMAL LOW (ref 8.9–10.3)
Chloride: 107 mmol/L (ref 98–111)
Creatinine, Ser: 1.2 mg/dL (ref 0.61–1.24)
GFR, Estimated: >60 mL/min (ref >60)
Glucose, Bld: 104 mg/dL — ABNORMAL HIGH (ref 70–99)
Potassium: 4.8 mmol/L (ref 3.5–5.1)
Sodium: 136 mmol/L (ref 135–145)
Total Bilirubin: 1 mg/dL (ref 0.3–1.2)
Total Protein: 4.8 g/dL — ABNORMAL LOW (ref 6.5–8.1)

## 2021-08-20 LAB — GLUCOSE, CAPILLARY
Glucose-Capillary: 153 mg/dL — ABNORMAL HIGH (ref 70–99)
Glucose-Capillary: 174 mg/dL — ABNORMAL HIGH (ref 70–99)
Glucose-Capillary: 155 mg/dL — ABNORMAL HIGH (ref 70–99)
Glucose-Capillary: 240 mg/dL — ABNORMAL HIGH (ref 70–99)

## 2021-08-20 LAB — CBC
HCT: 33.3 % — ABNORMAL LOW (ref 39.0–52.0)
Hemoglobin: 11.6 g/dL — ABNORMAL LOW (ref 13.0–17.0)
MCH: 30.9 pg (ref 26.0–34.0)
MCHC: 34.8 g/dL (ref 30.0–36.0)
MCV: 88.8 fL (ref 80.0–100.0)
Platelets: 371 10*3/uL (ref 150–400)
RBC: 3.75 MIL/uL — ABNORMAL LOW (ref 4.22–5.81)
RDW: 14.1 % (ref 11.5–15.5)
WBC: 15.8 10*3/uL — ABNORMAL HIGH (ref 4.0–10.5)
nRBC: 0.1 % (ref 0.0–0.2)

## 2021-08-20 LAB — D-DIMER, QUANTITATIVE: D-Dimer, Quant: 1.35 ug/mL-FEU — ABNORMAL HIGH (ref 0.00–0.50)

## 2021-08-20 LAB — C-REACTIVE PROTEIN: CRP: 3.4 mg/dL — ABNORMAL HIGH (ref <1.0)

## 2021-08-20 MED ORDER — METHYLPREDNISOLONE SODIUM SUCC 125 MG IJ SOLR
60.0000 mg | Freq: Every day | INTRAMUSCULAR | Status: DC
  Administered 2021-08-21: 09:00:00 60 mg via INTRAVENOUS
  Filled 2021-08-20: qty 2

## 2021-08-20 NOTE — Consult Note (Signed)
[facility] Inpatient Consult   08/20/2021  Kerry Matthews July 03, 1936 [id number]   [facility] CM)  Patient chart reviewed with noted high risk score for unplanned readmission assessing for potential post hospital transition of care needs with [facility] CM services. Per review, patient is being followed by chronic care management team at primary provider office.   Plan: Will continue to follow for progression and disposition plans.   Of note, [facility] Care Management services does not replace or interfere with any services that are arranged by inpatient case management or social work.    Netta Cedars, MSN, RN [facility] [employer] [phone]  Toll free office [phone]

## 2021-08-20 NOTE — Plan of Care (Signed)
°  Problem: Clinical Measurements: Goal: Will remain free from infection Outcome: Progressing Goal: Respiratory complications will improve Outcome: Progressing   Problem: Activity: Goal: Risk for activity intolerance will decrease Outcome: Progressing   

## 2021-08-20 NOTE — Progress Notes (Signed)
Occupational Therapy Treatment Patient Details Name: Kerry Matthews MRN: [id number] DOB: 12/07/1935 Today's Date: 08/20/2021   History of present illness 85 y.o. M admitted 08/12/2021 with multifocal PNA. COVID-19 (+) 12/5. PMHx: ILD, pulmonary fibrosis, HTN, HLD, DM2, diabetic polyneuropathy,coronary artery disease, chronic LBP.   OT comments  Patient received in bed and was agreeable to OT session but asked to perform seated on EOB. Patient performed grooming and LB dressing seated on EOB with O2 at 97 and performed AROM to address activity tolerance for 15 minutes before O2 began dropping to 85 and patient was returned to supine and instructed on breathing technique to return to upper 90s.  Patient provided handout for energy conservation strategies and discussed with patient and family. Acute OT to continue to follow.    Recommendations for follow up therapy are one component of a multi-disciplinary discharge planning process, led by the attending physician.  Recommendations may be updated based on patient status, additional functional criteria and insurance authorization.    Follow Up Recommendations  Home health OT    Assistance Recommended at Discharge Set up Supervision/Assistance  Equipment Recommendations  BSC/3in1    Recommendations for Other Services      Precautions / Restrictions Precautions Precautions: Other (comment) (watch O2) Precaution Comments: watch O2 Restrictions Weight Bearing Restrictions: No       Mobility Bed Mobility Overal bed mobility: Needs Assistance Bed Mobility: Supine to Sit;Sit to Supine     Supine to sit: Supervision Sit to supine: Min guard;HOB elevated   General bed mobility comments: pateitn was able to get to EOB from supine but required assistance to return to supine due to fatigue    Transfers Overall transfer level: Needs assistance                 General transfer comment: did not perform tranfers     Balance  Overall balance assessment: Mild deficits observed, not formally tested                                         ADL either performed or assessed with clinical judgement   ADL Overall ADL's : Needs assistance/impaired     Grooming: Wash/dry hands;Wash/dry face;Oral care;Set up;Sitting Grooming Details (indicate cue type and reason): performed seated on recliner             Lower Body Dressing: Maximal assistance Lower Body Dressing Details (indicate cue type and reason): attempted to donn socks while seated on EOB and was unable to perform               General ADL Comments: all self care tasks performed seated on EOB    Extremity/Trunk Assessment              Vision       Perception     Praxis      Cognition Arousal/Alertness: Awake/alert Behavior During Therapy: WFL for tasks assessed/performed Overall Cognitive Status: Within Functional Limits for tasks assessed                                 General Comments: Patient tend to mouth breath and required frequent cues for breathing technique          Exercises Exercises: General Upper Extremity General Exercises - Upper Extremity Shoulder Flexion: AROM;10 reps;Both;Seated Shoulder Extension: AROM;Both;10 reps  Shoulder ABduction: AROM;Both;10 reps;Seated Shoulder ADduction: AROM;Both;10 reps;Seated   Shoulder Instructions       General Comments      Pertinent Vitals/ Pain       Pain Assessment: Faces Faces Pain Scale: Hurts little more Pain Location: chest with coughing Pain Descriptors / Indicators: Grimacing Pain Intervention(s): Monitored during session;Repositioned  Home Living                                          Prior Functioning/Environment              Frequency  Min 2X/week        Progress Toward Goals  OT Goals(current goals can now be found in the care plan section)  Progress towards OT goals: Progressing toward  goals  Acute Rehab OT Goals Patient Stated Goal: get better OT Goal Formulation: With patient/family Time For Goal Achievement: 08/28/21 Potential to Achieve Goals: Fair ADL Goals Pt Will Perform Lower Body Bathing: with min guard assist;with adaptive equipment Pt Will Perform Lower Body Dressing: with min guard assist;with adaptive equipment Pt Will Transfer to Toilet: with supervision;ambulating Pt Will Perform Toileting - Clothing Manipulation and hygiene: with modified independence;with adaptive equipment  Plan Discharge plan remains appropriate    Co-evaluation                 AM-PAC OT "6 Clicks" Daily Activity     Outcome Measure   Help from another person eating meals?: None Help from another person taking care of personal grooming?: A Little Help from another person toileting, which includes using toliet, bedpan, or urinal?: A Little Help from another person bathing (including washing, rinsing, drying)?: A Lot Help from another person to put on and taking off regular upper body clothing?: A Little Help from another person to put on and taking off regular lower body clothing?: A Lot 6 Click Score: 17    End of Session Equipment Utilized During Treatment: Oxygen  OT Visit Diagnosis: Other abnormalities of gait and mobility (R26.89);Unsteadiness on feet (R26.81)   Activity Tolerance Patient limited by fatigue   Patient Left in bed;with call bell/phone within reach;with bed alarm set;with family/visitor present   Nurse Communication Mobility status        Time: 5188-4166 OT Time Calculation (min): 24 min  Charges: OT General Charges $OT Visit: 1 Visit OT Treatments $Self Care/Home Management : 8-22 mins $Therapeutic Exercise: 8-22 mins  Lodema Hong, OTA Acute Rehabilitation Services  Pager [phone] Office [phone]   Trixie Dredge 08/20/2021, 12:53 PM

## 2021-08-20 NOTE — Consult Note (Signed)
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  He has been treated for bacterial pneumonia with cefepime/azithromycin since 12/19, . I/O net positive 3.5L but may be inaccurate. Only 1 charted weight.  Rising O2 requirement may coincide with decreasing steroid dose. CXR today seems to show more extensive alveolar opacities relative to 12/19 CXR.  He was last seen in clinic with Dr. Vaughan Browner 10/20/19.  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 No distress. Reports desaturations with activity. Currently on 4L with SpO2 96-99%  Objective   Blood pressure 106/62, pulse 73, temperature 98.1 F (36.7 C), temperature source Oral, resp. rate (!) 21, height 5\' 7"  (1.702 m), weight 85.7 kg, SpO2 91 %.        Intake/Output Summary (Last 24 hours) at 08/20/2021 1430 Last data filed at 08/20/2021 1343 Gross per 24 hour  Intake 800 ml  Output --  Net 800 ml   Filed Weights   08/13/21 0700  Weight: 85.7 kg   Physical Exam: General: Elderly and chronically ill-appearing, no acute distress HENT: [place], AT, OP clear, MMM Eyes: EOMI, no scleral icterus Respiratory: Bibasilar inspiratory crackles Cardiovascular: RRR, -M/R/G, no JVD GI: BS+, soft,  nontender Extremities:-Edema,-tenderness Neuro: AAO x4, CNII-XII grossly intact Psych: Normal mood, normal affect  Improving Cr Improving CRP Improving leukocytosis  UA 08/12/21 bland  CTA Chest 08/12/21 reviewed by me remarkable for extensive interval ggo with perivascular, peripheral and dependent predominance  TTE 2020 indeterminate for diastolic dysfunction   BNP not elevated  Resolved Hospital Problem list     Assessment & Plan:   # Acute hypoxic respiratory failure # Widespread ggo # Covid-19 pneumonia Could have ongoing infection-related pneumonitis given ritux exposure and positive PCR, organizing pneumonia from covid-19 or underlying ILD (although extrapulmonary symptoms have been quiescent) - has heightened susceptibility to either with underlying ILD. Worsening with reduction in steroid dose also makes me wonder about DAH given blood tinged sputum, perivascular distribution of ggo, and some drop in Hb. While CTA negative 12/19 and US DVT neg, given abrupt rise in O2 requirement that probably isn't fully explained by worsening airspace disease, sharp rise in d-dimer, agree that PE is a concern.  08/20/21 -Improving inflammatory markers. At rest O2 improved to 4L. Though he will have increased O2 requirement with activity.  Expect slow recovery  -Reduce solumedrol to 60 mg daily with plan for slow titration -Hold on diuresis -Agree with empiric treatment of PE, watch for worsening hemoptysis -Encourage OOB and chair position during the day  Discussed plan with RN   Best Practice (right click and "Reselect all SmartList Selections" daily)    Last date of multidisciplinary goals of care discussion [12/24. Discussed with patient, wife and daughter that if he were to require mechanical ventilation I thought that it would likely only  prolong his death rather than provide opportunity for significant functional recovery or at best would result in prolonged, rocky recovery  away from home. He agreed that he would want to be DNR.]  Labs   CBC: Recent Labs  Lab 08/15/21 0051 08/17/21 0204 08/18/21 0119 08/19/21 0148 08/20/21 0122  WBC 15.6* 14.7* 12.0* 17.2* 15.8*  HGB 11.7* 11.8* 11.7* 11.9* 11.6*  HCT 33.6* 35.0* 34.8* 34.9* 33.3*  MCV 82.2 83.5 84.3 85.7 88.8  PLT 281 310 320 383 993    Basic Metabolic Panel: Recent Labs  Lab 08/15/21 0051 08/17/21 0204 08/18/21 0119 08/19/21 0148 08/19/21 0435 08/20/21 0122  NA 131* 134* 135 135  --  136  K 4.4 4.5 4.3 4.8  --  4.8  CL 102 105 105 104  --  107  CO2 20* 22 22 21*  --  22  GLUCOSE 168* 124* 185* 171*  --  104*  BUN 25* 29* 39* 42*  --  34*  CREATININE 1.20 1.28* 1.32* 1.22  --  1.20  CALCIUM 7.9* 7.9* 7.8* 7.7*  --  7.8*  MG  --   --   --   --  2.6*  --    GFR: Estimated Creatinine Clearance: 48.8 mL/min (by C-G formula based on SCr of 1.2 mg/dL). Recent Labs  Lab 08/17/21 0204 08/17/21 1109 08/18/21 0119 08/19/21 0148 08/20/21 0122  PROCALCITON  --  0.27  --   --   --   WBC 14.7*  --  12.0* 17.2* 15.8*    Liver Function Tests: Recent Labs  Lab 08/18/21 0119 08/19/21 0148 08/20/21 0122  AST 43* 55* 58*  ALT 82* 81* 91*  ALKPHOS 146* 127* 144*  BILITOT 1.3* 1.1 1.0  PROT 4.9* 4.7* 4.8*  ALBUMIN 2.3* 2.1* 2.2*   No results for input(s): LIPASE, AMYLASE in the last 168 hours. No results for input(s): AMMONIA in the last 168 hours.  ABG No results found for: PHART, PCO2ART, PO2ART, HCO3, TCO2, ACIDBASEDEF, O2SAT   Coagulation Profile: No results for input(s): INR, PROTIME in the last 168 hours.  Cardiac Enzymes: No results for input(s): CKTOTAL, CKMB, CKMBINDEX, TROPONINI in the last 168 hours.  HbA1C: Hgb A1c MFr Bld  Date/Time Value Ref Range Status  08/07/2021 10:31 AM 6.3 (H) 4.8 - 5.6 % Final    Comment:             Prediabetes: 5.7 - 6.4          Diabetes: >6.4          Glycemic control for adults with diabetes: <7.0   04/04/2021 08:33 AM 6.7 (H) 4.8  - 5.6 % Final    Comment:             Prediabetes: 5.7 - 6.4          Diabetes: >6.4          Glycemic control for adults with diabetes: <7.0     CBG: Recent Labs  Lab 08/19/21 1234 08/19/21 1626 08/19/21 2054 08/20/21 0746 08/20/21 1143  GLUCAP 244* 332* 333* 153* 174*    Review of Systems:   12 point review of systems is negative except as in HPI  Past Medical History:  He,  has a past medical history of Abnormal ANCA test (04/12/2019), Abnormal CT of the chest, Abnormal findings on diagnostic imaging of lung (04/12/2019), Acute low back pain without sciatica (03/08/2020), Acute low back pain without sciatica (03/08/2020), Acute lower UTI (07/07/2020), Acute pain  of left knee (11/30/2019), AKI (acute kidney injury) ([place]), Alpha-1-antitrypsin deficiency carrier (05/04/2019), Anemia, Atherosclerotic heart disease of native coronary artery without angina pectoris (05/03/2018), Body mass index (BMI) 29.0-29.9, adult (01/05/2020), Bradycardia, Burning sensation of feet (01/05/2020), CKD (chronic kidney disease) (05/03/2018), Community acquired pneumonia, Community acquired pneumonia, Coronary artery disease, Cough, Diabetes ([place]), Diabetes mellitus type 2 in obese ([place]) (04/01/2019), Dyslipidemia associated with type 2 diabetes mellitus ([place]) (05/03/2018), Dysuria (09/07/2020), Elevated LFTs (04/01/2019), Elevated rheumatoid factor (04/12/2019), Emphysema (subcutaneous) (surgical) resulting from a procedure (05/03/2018), Essential hypertension (05/03/2018), Fatigue (03/29/2021), Fever, GAD (generalized anxiety disorder), GERD (gastroesophageal reflux disease), Hematuria (09/07/2020), History of alpha-1-antitrypsin deficiency (04/12/2019), History of kidney stones, Hyperlipidemia, Hypoxemia, Idiopathic pulmonary fibrosis ([place]) (03/29/2021), 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), Insomnia (05/03/2018), 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), Leukocytosis (03/08/2020), Low vitamin B12  level (04/03/2019), Lower extremity edema, Medicare annual wellness visit, subsequent (08/09/2020), Microscopic polyangiitis ([place]) (03/29/2021), Mixed hyperlipidemia (05/03/2018), Myalgia (04/19/2019), Osteoarthritis, Other emphysema ([place]), Other long term (current) drug therapy (03/29/2021), Paresthesias in left hand (11/30/2019), Peripheral polyneuropathy (01/30/2020), Pneumonia (04/01/2019), Pneumonitis, Pneumonitis, Polymyositis ([place]), Primary insomnia, Proteinuria (09/07/2020), Renal insufficiency (09/06/2020), Renal stones, Rheumatoid factor positive (03/29/2021), Sepsis ([place]) (04/19/2019), Severe sepsis ([place]) (07/07/2020), Skin cancer, 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   Surgical History:   Past Surgical History:  Procedure Laterality Date   ANGIOPLASTY  2010   BACK SURGERY     CATARACT EXTRACTION     COLONOSCOPY  06/16/2005   Mild colitis involving splenic flexure. Colonic polyps, status post polypectomy. Mild pancolonic diverticulitits. Internal hemorrhoids.    ESOPHAGOGASTRODUODENOSCOPY  04/26/2003   Irregular Z line suggestive of GERD. Mild gastritis status post CLO testing.    TOTAL KNEE ARTHROPLASTY Left 12/17/2020   Procedure: LEFT TOTAL KNEE ARTHROPLASTY;  Surgeon: Leandrew Koyanagi, MD;  Location: [facility];  Service: Orthopedics;  Laterality: Left;   TRIGGER FINGER RELEASE       Social History:   reports that he has never smoked. He has never used smokeless tobacco. He reports that he does not drink alcohol and does not use drugs.   Family History:  His family history includes Clotting disorder in his brother; Heart attack in his sister; Lung disease in his sister; Pancreatic cancer in his sister; Stroke in his father; Tuberculosis in his mother. There is no history of Colon cancer or Esophageal cancer.    Allergies Allergies  Allergen Reactions   Ace Inhibitors Other (See Comments)    = Slow heart rate   Hydrocodone Nausea And Vomiting   Hydrocodone-Acetaminophen Nausea And Vomiting   Nsaids Other (See Comments)    Kidney issues   Sulfamethoxazole Nausea And Vomiting   Sulfamethoxazole-Trimethoprim Nausea And Vomiting   Trimethoprim Other (See Comments)    Reaction not recalled     Home Medications  Prior to Admission medications   Medication Sig Start Date End Date Taking? Authorizing Provider  aspirin EC 81 MG tablet Take 1 tablet (81 mg total) by mouth 2 (two) times daily. To be taken after surgery Patient taking differently: Take 81 mg by mouth in the morning. 11/07/20  Yes Dwana Melena L, PA-C  azithromycin (ZITHROMAX) 250 MG tablet 2 DAILY FOR FIRST DAY, THEN DECREASE TO ONE DAILY FOR 4 MORE DAYS. Patient taking differently: Take 250-500 mg by mouth See admin instructions. Starting on 08/09/2021, take 500 mg by mouth on day one, then decrease to 250  mg once a day for 4 days 08/09/21  Yes Cox, Kirsten, MD  cefdinir (OMNICEF) 300 MG capsule Take 1 capsule (300 mg total) by mouth 2 (two) times daily. 08/09/21  Yes Cox, Kirsten, MD  Coenzyme Q10 100 MG TABS Take 100 mg by mouth at bedtime.   Yes [provider]  furosemide (LASIX) 20 MG tablet Take 20 mg by mouth daily as needed for fluid.   Yes [provider]  gabapentin (NEURONTIN) 400 MG capsule Take 400 mg by mouth 2 (two) times daily.   Yes [provider]  Multiple Vitamins-Minerals (PRESERVISION AREDS 2) CAPS Take 1 capsule by mouth 2 (two) times daily after a meal.   Yes [provider]  nitroGLYCERIN (NITROSTAT) 0.4 MG SL tablet Place 1 tablet (0.4 mg total) under the tongue every 5 (five) minutes as needed for chest pain. 09/06/20  Yes Revankar, Reita Cliche, MD  Omega-3 Fatty Acids (FISH OIL) 1000 MG CAPS Take 2,000 mg by mouth daily.   Yes [provider]  omeprazole (PRILOSEC) 20  MG capsule Take 1 capsule (20 mg total) by mouth daily. Patient taking differently: Take 20 mg by mouth daily before breakfast. 04/23/21  Yes Cox, Kirsten, MD  riTUXimab (RITUXAN) 100 MG/10ML injection Inject 100 mg into the vein every 6 (six) months.   Yes [provider]  rosuvastatin (CRESTOR) 20 MG tablet Take 1 tablet (20 mg total) by mouth daily. Patient taking differently: Take 20 mg by mouth at bedtime. 04/23/21  Yes Cox, Kirsten, MD  vitamin B-12 (CYANOCOBALAMIN) 1000 MCG tablet Take 1 tablet (1,000 mcg total) by mouth daily. Patient taking differently: Take 1,000 mcg by mouth daily with breakfast. 04/06/19  Yes Vann, Jessica U, DO  docusate sodium (COLACE) 100 MG capsule Take 100 mg by mouth 2 (two) times daily as needed for mild constipation. Patient not taking: Reported on 08/12/2021    [provider]  glucose blood (TRUE METRIX BLOOD GLUCOSE TEST) test strip E11.69 TEST BLOOD SUGAR THREE TIMES DAILY BEFORE MEALS 10/03/20   [name], MD     Critical care time: 32 minutes

## 2021-08-20 NOTE — Progress Notes (Addendum)
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  Patient was admitted-treated with steroids and broad-spectrum antibiotics-hypoxia gradually improved-and was only requiring 2-3 L of oxygen.  Once steroid dosage was further de-escalated-patient was noted to have significant worsening of his hypoxemia.  PCCM/ID was consulted-see below for further details.  Significant events: 12/05>> COVID-19 positive on home testing 12/19>> admit for shortness of breath-COVID-19 PCR positive.  CT value of 28.6. 12/24>> worsening hypoxemia-started on salter high flow.  PCCM consulted. 12/24>> SARS-COV-2-Ab IgG nonreactive (s/p 3 vaccines, natural infection) 12/25>> remains on salter high flow- 10-15 L, ID consulted-convalescent plasma ordered.  CTA chest negative for PE   Subjective: Stable on 8 9 L at rest-requires 15 L with ambulation.  Continues to have some coughing spells.  Appears comfortable at rest.  Objective: Vitals: Blood pressure 119/68, pulse 68, temperature (!) 97.5 F (36.4 C), temperature source Oral, resp. rate 20, height 5\' 7"  (1.702 m), weight 85.7 kg, SpO2 98 %.   Physical exam: Gen Exam:Alert awake-not in any distress HEENT:atraumatic, normocephalic Chest: Bibasilar rales. CVS:S1S2 regular Abdomen:soft non tender, non distended Extremities:no edema Neurology: Non focal Skin: no rash     Recent Labs    08/19/21 0148 08/20/21 0122  DDIMER 1.97* 1.35*  CRP 6.5* 3.4*     Assessment & Plan: Acute hypoxic respiratory failure due to multifocal pneumonia in a immunocompromised patient (history of microscopic polyangiitis on Rituxan infusion every 6 months) with recent COVID-19 infection: Hypoxemia appears stable-8 9-liters at rest-but required 15 L with minimal ambulation.  Remains on IV steroids-as patient developed worsening hypoxemia on 12/24 when steroid dosing was de-escalated to prednisone.  Has completed 1 week course of IV cefepime.  Repeat CTA chest on 12/25 was negative for VTE.  Lower extremity Dopplers were negative for DVT.  Since he appears to have no antibody response-even after COVID-19 vaccination x3-after discussion with ID-will be transfused 1 unit of convalescent plasma-hoping that this will arrive today from [place].  Volume status stable-do not think patient requires diuretics.  Plans are to continue supportive care-and attempt to titrate down FiO2.  History of ILD/microscopic polyangiitis: On Rituxan every 6 months (last dose June 2022)-we will schedule to get a injection earlier this month-however has now been postponed due to COVID-19 diagnoses.  See above.  Elevated D-dimer: Likely due to COVID-19 infection associated inflammation-lower extremity Doppler on 12/24 negative for DVT-CTA chest on 12/25 negative for VTE.  Was on empiric therapeutic anticoagulation with Lovenox-but since CTA chest negative for PE-have switch to intermediate dosing of Lovenox for now.    Leukocytosis: Likely due to steroids-cultures negative so far-continue to monitor.  CKD stage IIIa: Creatinine at baseline-watch closely.   Hyponatremia: Mild-improved-monitor periodically.  CAD: No anginal symptoms-continue aspirin/statin.  HLD: Continue statin  DM-2 (A1c 6.3 on 12/14) with uncontrolled hyperglycemia due to steroids: CBGs higher side-continue Semglee 22 units, 8 units of  Premeal NovoLog, SSI-reassess on 12/27.     Recent  Labs    08/19/21 1626 08/19/21 2054 08/20/21 0746  GLUCAP 332* 333* 153*     Peripheral neuropathy: Likely due to DM-continue Neurontin  HLD: Continue statin  GERD: Continue PPI  DVT prophylaxis: Lovenox at intermediate dosing. Code Status: 12/27>> patient wants to switch from DNR to DNI Family Communication: Spouse at bedside. Disposition:   Status is: Inpatient  Remains inpatient appropriate because: IV ABX and steroids-if clinical improvement continues-Home in the next few days.    Consultants:  none   Data Reviewed: I have personally reviewed following labs and imaging studies  CBC: Recent Labs  Lab 08/15/21 0051 08/17/21 0204 08/18/21 0119 08/19/21 0148 08/20/21 0122  WBC 15.6* 14.7* 12.0* 17.2* 15.8*  HGB 11.7* 11.8* 11.7* 11.9* 11.6*  HCT 33.6* 35.0* 34.8* 34.9* 33.3*  MCV 82.2 83.5 84.3 85.7 88.8  PLT 281 310 320 383 371     Basic Metabolic Panel: Recent Labs  Lab 08/15/21 0051 08/17/21 0204 08/18/21 0119 08/19/21 0148 08/19/21 0435 08/20/21 0122  NA 131* 134* 135 135  --  136  K 4.4 4.5 4.3 4.8  --  4.8  CL 102 105 105 104  --  107  CO2 20* 22 22 21*  --  22  GLUCOSE 168* 124* 185* 171*  --  104*  BUN 25* 29* 39* 42*  --  34*  CREATININE 1.20 1.28* 1.32* 1.22  --  1.20  CALCIUM 7.9* 7.9* 7.8* 7.7*  --  7.8*  MG  --   --   --   --  2.6*  --      GFR: Estimated Creatinine Clearance: 48.8 mL/min (by C-G formula based on SCr of 1.2 mg/dL).  Liver Function Tests: Recent Labs  Lab 08/18/21 0119 08/19/21 0148 08/20/21 0122  AST 43* 55* 58*  ALT 82* 81* 91*  ALKPHOS 146* 127* 144*  BILITOT 1.3* 1.1 1.0  PROT 4.9* 4.7* 4.8*  ALBUMIN 2.3* 2.1* 2.2*     CBG: Recent Labs  Lab 08/19/21 0757 08/19/21 1234 08/19/21 1626 08/19/21 2054 08/20/21 0746  GLUCAP 173* 244* 332* 333* 153*      Recent Results (from the past 240 hour(s))  Resp Panel by RT-PCR (Flu A&B, Covid)  Nasopharyngeal Swab     Status: Abnormal   Collection Time: 08/12/21 11:11 AM   Specimen: Nasopharyngeal Swab; Nasopharyngeal(NP) swabs in vial transport medium  Result Value Ref Range Status   SARS Coronavirus 2 by RT PCR POSITIVE (A) NEGATIVE Final    Comment: (NOTE) SARS-CoV-2 target nucleic acids are DETECTED.  The SARS-CoV-2 RNA is generally detectable in upper respiratory specimens during the acute phase of infection. Positive results are indicative of the presence of the identified virus, but do not rule out bacterial infection or co-infection with other pathogens not detected by the test. Clinical correlation with patient history and other diagnostic information is necessary to determine patient infection status. The expected result is Negative.  Fact Sheet for Patients: EntrepreneurPulse.com.au  Fact Sheet for Healthcare Providers: IncredibleEmployment.be  This test is not yet approved or cleared by the [place] FDA and  has been authorized for detection and/or diagnosis of SARS-CoV-2 by FDA under an Emergency Use Authorization (EUA).  This EUA will remain in effect (meaning this test can be used) for the duration of  the COVID-19 declaration under Section 564(b)(1) of the A ct, 21 U.S.C. section 360bbb-3(b)(1), unless the authorization is terminated or revoked sooner.     Influenza A by PCR NEGATIVE NEGATIVE Final  Influenza B by PCR NEGATIVE NEGATIVE Final    Comment: (NOTE) The Xpert Xpress SARS-CoV-2/FLU/RSV plus assay is intended as an aid in the diagnosis of influenza from Nasopharyngeal swab specimens and should not be used as a sole basis for treatment. Nasal washings and aspirates are unacceptable for Xpert Xpress SARS-CoV-2/FLU/RSV testing.  Fact Sheet for Patients: EntrepreneurPulse.com.au  Fact Sheet for Healthcare Providers: IncredibleEmployment.be  This test is not yet  approved or cleared by the [place] FDA and has been authorized for detection and/or diagnosis of SARS-CoV-2 by FDA under an Emergency Use Authorization (EUA). This EUA will remain in effect (meaning this test can be used) for the duration of the COVID-19 declaration under Section 564(b)(1) of the Act, 21 U.S.C. section 360bbb-3(b)(1), unless the authorization is terminated or revoked.  Performed at [facility], [place] [address], [place], [place] [address]   Culture, blood (routine x 2)     Status: None   Collection Time: 08/12/21  9:34 PM   Specimen: BLOOD LEFT HAND  Result Value Ref Range Status   Specimen Description BLOOD LEFT HAND  Final   Special Requests   Final    BOTTLES DRAWN AEROBIC AND ANAEROBIC Blood Culture adequate volume   Culture   Final    NO GROWTH 5 DAYS Performed at [facility], [place] [address], [place], [place] [address]    Report Status 08/17/2021 FINAL  Final  Respiratory (~20 pathogens) panel by PCR     Status: None   Collection Time: 08/12/21  9:35 PM   Specimen: Nasopharyngeal Swab; Respiratory  Result Value Ref Range Status   Adenovirus NOT DETECTED NOT DETECTED Final   Coronavirus 229E NOT DETECTED NOT DETECTED Final    Comment: (NOTE) The Coronavirus on the Respiratory Panel, DOES NOT test for the novel  Coronavirus (2019 nCoV)    Coronavirus HKU1 NOT DETECTED NOT DETECTED Final   Coronavirus NL63 NOT DETECTED NOT DETECTED Final   Coronavirus OC43 NOT DETECTED NOT DETECTED Final   Metapneumovirus NOT DETECTED NOT DETECTED Final   Rhinovirus / Enterovirus NOT DETECTED NOT DETECTED Final   Influenza A NOT DETECTED NOT DETECTED Final   Influenza B NOT DETECTED NOT DETECTED Final   Parainfluenza Virus 1 NOT DETECTED NOT DETECTED Final   Parainfluenza Virus 2 NOT DETECTED NOT DETECTED Final   Parainfluenza Virus 3 NOT DETECTED NOT DETECTED Final   Parainfluenza Virus 4 NOT DETECTED NOT DETECTED Final   Respiratory Syncytial Virus  NOT DETECTED NOT DETECTED Final   Bordetella pertussis NOT DETECTED NOT DETECTED Final   Bordetella Parapertussis NOT DETECTED NOT DETECTED Final   Chlamydophila pneumoniae NOT DETECTED NOT DETECTED Final   Mycoplasma pneumoniae NOT DETECTED NOT DETECTED Final    Comment: Performed at [facility], [place] [address], [place], [place] [address]  Culture, blood (routine x 2)     Status: None   Collection Time: 08/12/21  9:39 PM   Specimen: BLOOD RIGHT HAND  Result Value Ref Range Status   Specimen Description BLOOD RIGHT HAND  Final   Special Requests   Final    BOTTLES DRAWN AEROBIC AND ANAEROBIC Blood Culture adequate volume   Culture   Final    NO GROWTH 5 DAYS Performed at [facility], [place] [address], [place], [place] [address]    Report Status 08/17/2021 FINAL  Final  MRSA Next Gen by PCR, Nasal     Status: None   Collection Time: 08/12/21 10:42 PM  Specimen: Nasal Mucosa; Nasal Swab  Result Value Ref Range Status   MRSA by PCR Next Gen NOT DETECTED NOT DETECTED Final    Comment: (NOTE) The GeneXpert MRSA Assay (FDA approved for NASAL specimens only), is one component of a comprehensive MRSA colonization surveillance program. It is not intended to diagnose MRSA infection nor to guide or monitor treatment for MRSA infections. Test performance is not FDA approved in patients less than 68 years old. Performed at [facility], [place] [address], [place], [place] [address]           Radiology Studies: CT Angio Chest Pulmonary Embolism (PE) W or WO Contrast  Result Date: 08/18/2021 CLINICAL DATA:  PE suspected, positive D-dimer EXAM: CT ANGIOGRAPHY CHEST WITH CONTRAST TECHNIQUE: Multidetector CT imaging of the chest was performed using the standard protocol during bolus administration of intravenous contrast. Multiplanar CT image reconstructions and MIPs were obtained to evaluate the vascular anatomy. CONTRAST:  73mL OMNIPAQUE IOHEXOL 350 MG/ML SOLN  COMPARISON:  08/12/2021, 10/14/2019 FINDINGS: Cardiovascular: Satisfactory opacification of the pulmonary arteries to the segmental level. No evidence of pulmonary embolism. Cardiomegaly. Three-vessel coronary artery calcifications. Enlargement of the main pulmonary artery, measuring up to 4.0 cm in caliber. No pericardial effusion. Mediastinum/Nodes: Unchanged prominent mediastinal lymph nodes thyroid gland, trachea, and esophagus demonstrate no significant findings. Lungs/Pleura: There is extensive, somewhat geographic bilateral heterogeneous and ground-glass airspace opacity with superimposed interlobular septal thickening. Fibrotic change and subpleural bronchiolectasis at the dependent bilateral lung bases (series 7, image 62). Trace bilateral pleural effusions. Upper Abdomen: No acute abnormality. Musculoskeletal: No chest wall abnormality. No acute or significant osseous findings. Review of the MIP images confirms the above findings. IMPRESSION: 1. Negative examination for pulmonary embolism. 2. Extensive, somewhat geographic bilateral heterogeneous and ground-glass airspace opacity with superimposed interlobular septal thickening, somewhat worsened compared to prior examination. Trace bilateral pleural effusions. Findings are most consistent with multifocal infection, including COVID-19 if clinically suspected. 3. Fibrotic change and subpleural bronchiolectasis at the dependent bilateral lung bases, in keeping with UIP pattern of fibrosis seen on prior CT dated 10/14/2019 although otherwise not well characterized in the setting of acute airspace disease. 4. Cardiomegaly and coronary artery disease. 5. Enlargement of the main pulmonary artery, as can be seen in pulmonary hypertension. Electronically Signed   By: Delanna Ahmadi M.D.   On: 08/18/2021 12:58        Scheduled Meds:  sodium chloride   Intravenous Once   acetaminophen  650 mg Oral Once   vitamin C  500 mg Oral Daily   aspirin EC  81 mg Oral  Daily   benzonatate  200 mg Oral TID   diphenhydrAMINE  25 mg Intravenous Once   enoxaparin (LOVENOX) injection  40 mg Subcutaneous Q12H   gabapentin  400 mg Oral BID   guaiFENesin  600 mg Oral BID   insulin aspart  0-15 Units Subcutaneous TID AC & HS   insulin aspart  8 Units Subcutaneous TID WC   insulin glargine-yfgn  22 Units Subcutaneous Daily   methylPREDNISolone (SOLU-MEDROL) injection  90 mg Intravenous Daily   pantoprazole  40 mg Oral Q1200   rosuvastatin  20 mg Oral QHS   zinc sulfate  220 mg Oral Daily   Continuous Infusions:     LOS: 8 days       Oren Binet, MD [facility]   To contact the attending provider between 7A-7P or the covering provider during after hours 7P-7A, please log into the web site  [email or web address] and access using universal [place] password for that web site. If you do not have the password, please call the hospital operator.  08/20/2021, 11:17 AM

## 2021-08-21 DIAGNOSIS — U071 COVID-19: Secondary | ICD-10-CM | POA: Diagnosis not present

## 2021-08-21 DIAGNOSIS — N1831 Chronic kidney disease, stage 3a: Secondary | ICD-10-CM | POA: Diagnosis not present

## 2021-08-21 DIAGNOSIS — E1142 Type 2 diabetes mellitus with diabetic polyneuropathy: Secondary | ICD-10-CM | POA: Diagnosis not present

## 2021-08-21 DIAGNOSIS — J189 Pneumonia, unspecified organism: Secondary | ICD-10-CM | POA: Diagnosis not present

## 2021-08-21 LAB — BPAM FFP
Blood Product Expiration Date: 202212282208
ISSUE DATE / TIME: 202212272253
Unit Type and Rh: 6200

## 2021-08-21 LAB — GLUCOSE, CAPILLARY
Glucose-Capillary: 114 mg/dL — ABNORMAL HIGH (ref 70–99)
Glucose-Capillary: 136 mg/dL — ABNORMAL HIGH (ref 70–99)
Glucose-Capillary: 244 mg/dL — ABNORMAL HIGH (ref 70–99)
Glucose-Capillary: 242 mg/dL — ABNORMAL HIGH (ref 70–99)

## 2021-08-21 LAB — PREPARE FRESH FROZEN PLASMA: Unit division: 0

## 2021-08-21 LAB — C-REACTIVE PROTEIN: CRP: 2.3 mg/dL — ABNORMAL HIGH (ref <1.0)

## 2021-08-21 MED ORDER — METHYLPREDNISOLONE SODIUM SUCC 40 MG IJ SOLR: 40.0000 mg | Freq: Every day | INTRAMUSCULAR | Status: DC

## 2021-08-21 MED ORDER — METHYLPREDNISOLONE SODIUM SUCC 125 MG IJ SOLR
60.0000 mg | Freq: Every day | INTRAMUSCULAR | Status: DC
  Administered 2021-08-22 – 2021-08-23 (×2): 60 mg via INTRAVENOUS
  Filled 2021-08-21 (×2): qty 2

## 2021-08-21 MED ORDER — METHYLPREDNISOLONE SODIUM SUCC 125 MG IJ SOLR: 60.0000 mg | Freq: Every day | INTRAMUSCULAR | Status: DC

## 2021-08-21 MED ORDER — METHYLPREDNISOLONE SODIUM SUCC 125 MG IJ SOLR: 50.0000 mg | Freq: Every day | INTRAMUSCULAR | Status: DC

## 2021-08-21 MED ORDER — ENOXAPARIN SODIUM 40 MG/0.4ML IJ SOSY
40.0000 mg | PREFILLED_SYRINGE | INTRAMUSCULAR | Status: DC
  Administered 2021-08-22 – 2021-08-25 (×4): 40 mg via SUBCUTANEOUS
  Filled 2021-08-21 (×4): qty 0.4

## 2021-08-21 NOTE — Progress Notes (Signed)
Physical Therapy Treatment Patient Details Name: Kerry Matthews MRN: [id number] DOB: [birth year] Today's Date: 08/21/2021   History of Present Illness 85 y.o. M admitted 08/12/2021 with multifocal PNA. COVID-19 (+) 12/5. PMHx: ILD, pulmonary fibrosis, HTN, HLD, DM2, diabetic polyneuropathy,coronary artery disease, chronic LBP.    PT Comments    Dr Loanne Drilling present during session and instructed therapy team PT/OT/RT in oxygen management for patient with mobility. Informed team to increase supplemental O2 to 12-15L O2 via HFNC immediately prior to mobility in attempt to decreased pt recovery rate with mobility and then wean pt back to baseline supplemental O2. Pt continues to require increased time and cuing for oxygen recovery with mobility. Requiring 2-3 min to recover from bed mobility on 6L O2, and 2-3 min for recovery to 88%O2 after ambulation on 15L O2 via HFNC. Pt requires 3-4 min to recover to 88%O2 after 2nd bout of ambulation and ~10 min to wean back down to 8L O2. RN notified to continue weaning O2 back while at rest sitting up in the recliner. Pt requires cuing for breathing through his nose to maximize oxygen saturation. Pt is min A for transfers and ambulation of 20 feet with RW. D/c plans remain appropriate at this time. PT will continue to follow acutely.     Recommendations for follow up therapy are one component of a multi-disciplinary discharge planning process, led by the attending physician.  Recommendations may be updated based on patient status, additional functional criteria and insurance authorization.  Follow Up Recommendations  Home health PT     Assistance Recommended at Discharge Intermittent Supervision/Assistance  Equipment Recommendations  BSC/3in1       Precautions / Restrictions Precautions Precautions: Other (comment) (watch O2) Precaution Comments: watch O2, MD would like mobility on high concentration O2 to decrease recovery time Restrictions Weight  Bearing Restrictions: No     Mobility  Bed Mobility Overal bed mobility: Needs Assistance Bed Mobility: Supine to Sit     Supine to sit: Supervision     General bed mobility comments: increased time and use of rail    Transfers Overall transfer level: Needs assistance Equipment used: Rolling walker (2 wheels) Transfers: Sit to/from Stand;Bed to chair/wheelchair/BSC Sit to Stand: Min assist     Step pivot transfers: Min assist     General transfer comment: cues for hand placement and assistance with power up. Patient required increased time to recover following transfers and mobility    Ambulation/Gait Ambulation/Gait assistance: Min assist;Min guard Gait Distance (Feet): 20 Feet (x2) Assistive device: Rolling walker (2 wheels) Gait Pattern/deviations: Step-through pattern;Decreased stride length;Trunk flexed;Shuffle Gait velocity: decreased Gait velocity interpretation: <1.31 ft/sec, indicative of household ambulator   General Gait Details: slowed shuffling gait, vc for proximity to RW and upright posture, as well as breathing in through his nose          Balance Overall balance assessment: Mild deficits observed, not formally tested                                          Cognition Arousal/Alertness: Awake/alert Behavior During Therapy: WFL for tasks assessed/performed Overall Cognitive Status: Within Functional Limits for tasks assessed                                 General Comments: requires frequent cues to correct breathing technique  and avoiding mouth breathing        Exercises General Exercises - Lower Extremity Ankle Circles/Pumps: AROM;20 reps;Seated    General Comments General comments (skin integrity, edema, etc.): Patient on 5 liters in bed, increased to 6 on portable oxygen when on EOB. Increased to 8, 10, then 15 during treatment to allow patient to perform mobility and maintain O2 saturation in 90s.   Patient tapered back to 8 liters at end of session.      Pertinent Vitals/Pain Pain Assessment: Faces Faces Pain Scale: Hurts little more Pain Location: chest with coughing Pain Descriptors / Indicators: Grimacing Pain Intervention(s): Limited activity within patient's tolerance;Monitored during session;Repositioned     PT Goals (current goals can now be found in the care plan section) Acute Rehab PT Goals PT Goal Formulation: With patient/family Time For Goal Achievement: 08/28/21 Potential to Achieve Goals: Good Progress towards PT goals: Progressing toward goals    Frequency    Min 3X/week      PT Plan Current plan remains appropriate    Co-evaluation PT/OT/SLP Co-Evaluation/Treatment: Yes Reason for Co-Treatment: Complexity of the patient's impairments (multi-system involvement) PT goals addressed during session: Mobility/safety with mobility OT goals addressed during session: ADL's and self-care      AM-PAC PT "6 Clicks" Mobility   Outcome Measure  Help needed turning from your back to your side while in a flat bed without using bedrails?: None Help needed moving from lying on your back to sitting on the side of a flat bed without using bedrails?: None Help needed moving to and from a bed to a chair (including a wheelchair)?: A Little Help needed standing up from a chair using your arms (e.g., wheelchair or bedside chair)?: A Little Help needed to walk in hospital room?: A Little Help needed climbing 3-5 steps with a railing? : A Lot 6 Click Score: 19    End of Session Equipment Utilized During Treatment: Oxygen Activity Tolerance: Patient tolerated treatment well;Other (comment) (oxygen desaturation) Patient left: in chair;with call bell/phone within reach;with chair alarm set;with family/visitor present Nurse Communication: Mobility status PT Visit Diagnosis: Difficulty in walking, not elsewhere classified (R26.2);Other abnormalities of gait and mobility  (R26.89)     Time: 8675-4492 PT Time Calculation (min) (ACUTE ONLY): 48 min  Charges:  $Gait Training: 8-22 mins                     Gergory Biello B. Migdalia Dk PT, DPT Acute Rehabilitation Services Pager [phone] Office [phone]    [place] 08/21/2021, 11:57 AM

## 2021-08-21 NOTE — Progress Notes (Signed)
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  He has been treated for bacterial pneumonia with cefepime/azithromycin since 12/19, . I/O net positive 3.5L but may be inaccurate. Only 1 charted weight.  Rising O2 requirement may coincide with decreasing steroid dose. CXR today seems to show more extensive alveolar opacities relative to 12/19 CXR.  He was last seen in clinic with Dr. Vaughan Browner 10/20/19.  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 Requiring 15L with activity with desaturations to 85% with recovery with rest  Interim History / Subjective:  Observed patient during physical therapy. Improved resting oxygenation to 4L. Requiring 15L with activity with desaturations to 85% with recovery with rest  Objective   Blood pressure 110/63, pulse 66, temperature 97.6 F (36.4 C), temperature source Oral, resp. rate (!) 22, height 5\' 7"  (1.702 m), weight 85.7 kg, SpO2 98 %.        Intake/Output Summary (Last 24 hours) at 08/21/2021 1528 Last data filed at 08/21/2021 0110 Gross per 24 hour  Intake 305 ml  Output --  Net 305 ml   Filed  Weights   08/13/21 0700  Weight: 85.7 kg   hysical Exam: General: Elderly and chronically ill-appearing, no acute distress HENT: [place], AT, OP clear, MMM Eyes: EOMI, no scleral icterus Respiratory: Bibasilar inspiratory crackles Cardiovascular: RRR, -M/R/G, no JVD Extremities:-Edema,-tenderness Neuro: AAO x4, CNII-XII grossly intact  No labs today  CTA Chest 08/12/21 reviewed by me remarkable for extensive interval ggo with perivascular, peripheral and dependent predominance  TTE 2020 indeterminate for diastolic dysfunction   Resolved Hospital Problem list     Assessment & Plan:   # Acute hypoxic respiratory failure # Covid-19 pneumonia # ILD flare Could have ongoing infection-related pneumonitis given ritux exposure and positive PCR, organizing pneumonia from covid-19 or underlying ILD (although extrapulmonary symptoms have been quiescent) - has heightened susceptibility to either with underlying ILD. Worsening with reduction in steroid dose also makes me wonder about DAH given blood tinged sputum, perivascular distribution of ggo, and some drop in Hb. While CTA negative 12/19 and US DVT neg, given abrupt rise in O2 requirement that probably isn't fully explained by worsening airspace disease, sharp rise in d-dimer, agree that PE is a concern.  08/20/21 -Improving inflammatory markers. At rest O2 improved to 4L. Though he will have increased O2 requirement with activity.  Expect slow recovery 08/21/21 - Requiring 15L with activity with desaturations to 85% with <2 min recovery with rest  -Solumedrol 60 mg daily with plan for slow titration (decrease by 10 mg every six days) -Hold on diuresis -Agree with empiric treatment of PE, watch for worsening hemoptysis -Continue PT, OOB and chair position during the day  Discussed plan with RN,  RT and PT while observing patient on therapy  Best Practice (right click and "Reselect all SmartList Selections" daily)    Last date of  multidisciplinary goals of care discussion [12/24. Discussed with patient, wife and daughter that if he were to require mechanical ventilation I thought that it would likely only prolong his death rather than provide opportunity for significant functional recovery or at best would result in prolonged, rocky recovery away from home. He agreed that he would want to be DNR.]  Labs   CBC: Recent Labs  Lab 08/15/21 0051 08/17/21 0204 08/18/21 0119 08/19/21 0148 08/20/21 0122  WBC 15.6* 14.7* 12.0* 17.2* 15.8*  HGB 11.7* 11.8* 11.7* 11.9* 11.6*  HCT 33.6* 35.0* 34.8* 34.9* 33.3*  MCV 82.2 83.5 84.3 85.7 88.8  PLT 281 310 320 383 662    Basic Metabolic Panel: Recent Labs  Lab 08/15/21 0051 08/17/21 0204 08/18/21 0119 08/19/21 0148 08/19/21 0435 08/20/21 0122  NA 131* 134* 135 135  --  136  K 4.4 4.5 4.3 4.8  --  4.8  CL 102 105 105 104  --  107  CO2 20* 22 22 21*  --  22  GLUCOSE 168* 124* 185* 171*  --  104*  BUN 25* 29* 39* 42*  --  34*  CREATININE 1.20 1.28* 1.32* 1.22  --  1.20  CALCIUM 7.9* 7.9* 7.8* 7.7*  --  7.8*  MG  --   --   --   --  2.6*  --    GFR: Estimated Creatinine Clearance: 48.8 mL/min (by C-G formula based on SCr of 1.2 mg/dL). Recent Labs  Lab 08/17/21 0204 08/17/21 1109 08/18/21 0119 08/19/21 0148 08/20/21 0122  PROCALCITON  --  0.27  --   --   --   WBC 14.7*  --  12.0* 17.2* 15.8*    Liver Function Tests: Recent Labs  Lab 08/18/21 0119 08/19/21 0148 08/20/21 0122  AST 43* 55* 58*  ALT 82* 81* 91*  ALKPHOS 146* 127* 144*  BILITOT 1.3* 1.1 1.0  PROT 4.9* 4.7* 4.8*  ALBUMIN 2.3* 2.1* 2.2*   No results for input(s): LIPASE, AMYLASE in the last 168 hours. No results for input(s): AMMONIA in the last 168 hours.  ABG No results found for: PHART, PCO2ART, PO2ART, HCO3, TCO2, ACIDBASEDEF, O2SAT   Coagulation Profile: No results for input(s): INR, PROTIME in the last 168 hours.  Cardiac Enzymes: No results for input(s): CKTOTAL, CKMB,  CKMBINDEX, TROPONINI in the last 168 hours.  HbA1C: Hgb A1c MFr Bld  Date/Time Value Ref Range Status  08/07/2021 10:31 AM 6.3 (H) 4.8 - 5.6 % Final    Comment:             Prediabetes: 5.7 - 6.4          Diabetes: >6.4          Glycemic control for adults with diabetes: <7.0   04/04/2021 08:33 AM 6.7 (H) 4.8 - 5.6 % Final    Comment:             Prediabetes: 5.7 - 6.4          Diabetes: >6.4          Glycemic control for adults with diabetes: <7.0     CBG: Recent Labs  Lab 08/20/21 1143 08/20/21 1545 08/20/21 2042 08/21/21 0736 08/21/21 1152  GLUCAP 174* 155* 240* 114* 136*   Critical care time: N/A    Care Time: 40 min  Rodman Pickle, M.D.  [facility] 08/21/2021 3:28 PM   Please see Amion for pager number to reach on-call Pulmonary and Critical Care Team.

## 2021-08-21 NOTE — Progress Notes (Signed)
Occupational Therapy Treatment Patient Details Name: Kerry Matthews MRN: [id number] DOB: [birth year] Today's Date: 08/21/2021   History of present illness 85 y.o. M admitted 08/12/2021 with multifocal PNA. COVID-19 (+) 12/5. PMHx: ILD, pulmonary fibrosis, HTN, HLD, DM2, diabetic polyneuropathy,coronary artery disease, chronic LBP.   OT comments  Patient seen with PT and RT to better address oxygen use during therapy. Patient on 5 liters while supine and increased to 6 on portable when on EOB and increased to 8, 10, and 15 during treatment to allow O2 saturation in 90's during mobility. Patient O2 dropped to 83 donning shoes on 6 liters and following mobility with RW x2 patient dropped to 86 and required increased time to recover.  Patient's O2 tapered to 8 liters by end of session at rest in recliner. Acute OT to continue to follow.    Recommendations for follow up therapy are one component of a multi-disciplinary discharge planning process, led by the attending physician.  Recommendations may be updated based on patient status, additional functional criteria and insurance authorization.    Follow Up Recommendations  Home health OT    Assistance Recommended at Discharge Set up Supervision/Assistance  Equipment Recommendations  BSC/3in1    Recommendations for Other Services      Precautions / Restrictions Precautions Precautions: Other (comment) (watch O2) Precaution Comments: watch O2 Restrictions Weight Bearing Restrictions: No       Mobility Bed Mobility Overal bed mobility: Needs Assistance Bed Mobility: Supine to Sit     Supine to sit: Supervision     General bed mobility comments: increased time and use of rail    Transfers Overall transfer level: Needs assistance Equipment used: Rolling walker (2 wheels) Transfers: Sit to/from Stand;Bed to chair/wheelchair/BSC Sit to Stand: Min assist   Step pivot transfers: Min assist       General transfer comment: cues for  hand placement and assistance with power up. Patient required increased time to recover following transfers and mobility     Balance Overall balance assessment: Mild deficits observed, not formally tested                                         ADL either performed or assessed with clinical judgement   ADL Overall ADL's : Needs assistance/impaired                     Lower Body Dressing: Minimal assistance;Sit to/from stand Lower Body Dressing Details (indicate cue type and reason): donned slippers with shoehorn and min assist               General ADL Comments: patient required increased time to recover following donning shoes due to O2 dropping to 83 on 6 liters    Extremity/Trunk Assessment              Vision       Perception     Praxis      Cognition Arousal/Alertness: Awake/alert Behavior During Therapy: WFL for tasks assessed/performed Overall Cognitive Status: Within Functional Limits for tasks assessed                                 General Comments: requires frequent cues to correct breathing technique and avoiding mouth breathing          Exercises     Shoulder  Instructions       General Comments Patient on 5 liters in bed, increased to 6 on portable oxygen when on EOB. Increased to 8, 10, then 15 during treatment to allow patient to perform mobility and maintain O2 saturation in 90s.  Patient tapered back to 8 liters at end of session.    Pertinent Vitals/ Pain       Pain Assessment: Faces Faces Pain Scale: Hurts little more Pain Location: chest with coughing Pain Descriptors / Indicators: Grimacing Pain Intervention(s): Monitored during session  Home Living                                          Prior Functioning/Environment              Frequency  Min 2X/week        Progress Toward Goals  OT Goals(current goals can now be found in the care plan section)   Progress towards OT goals: Progressing toward goals  Acute Rehab OT Goals Patient Stated Goal: get better OT Goal Formulation: With patient/family Time For Goal Achievement: 08/28/21 Potential to Achieve Goals: Fair ADL Goals Pt Will Perform Lower Body Bathing: with min guard assist;with adaptive equipment Pt Will Perform Lower Body Dressing: with min guard assist;with adaptive equipment Pt Will Transfer to Toilet: with supervision;ambulating Pt Will Perform Toileting - Clothing Manipulation and hygiene: with modified independence;with adaptive equipment  Plan Discharge plan remains appropriate    Co-evaluation    PT/OT/SLP Co-Evaluation/Treatment: Yes Reason for Co-Treatment: Complexity of the patient's impairments (multi-system involvement);To address functional/ADL transfers   OT goals addressed during session: ADL's and self-care      AM-PAC OT "6 Clicks" Daily Activity     Outcome Measure   Help from another person eating meals?: None Help from another person taking care of personal grooming?: A Little Help from another person toileting, which includes using toliet, bedpan, or urinal?: A Little Help from another person bathing (including washing, rinsing, drying)?: A Lot Help from another person to put on and taking off regular upper body clothing?: A Little Help from another person to put on and taking off regular lower body clothing?: A Lot 6 Click Score: 17    End of Session Equipment Utilized During Treatment: Oxygen;Rolling walker (2 wheels)  OT Visit Diagnosis: Other abnormalities of gait and mobility (R26.89);Unsteadiness on feet (R26.81)   Activity Tolerance Patient limited by fatigue   Patient Left in chair;with call bell/phone within reach;with family/visitor present   Nurse Communication Mobility status;Other (comment) (oxygen use and liters given)        Time: 8250-5397 OT Time Calculation (min): 48 min  Charges: OT General Charges $OT Visit: 1  Visit OT Treatments $Self Care/Home Management : 8-22 mins $Therapeutic Activity: 8-22 mins  Lodema Hong, OTA Acute Rehabilitation Services  Pager [phone] Office [phone]   Trixie Dredge 08/21/2021, 11:29 AM

## 2021-08-21 NOTE — Plan of Care (Signed)
°  Problem: Health Behavior/Discharge Planning: Goal: Ability to manage health-related needs will improve 08/21/2021 1227 by Emmaline Life, RN Outcome: Progressing 08/21/2021 1224 by Emmaline Life, RN Outcome: Progressing   Problem: Clinical Measurements: Goal: Ability to maintain clinical measurements within normal limits will improve 08/21/2021 1227 by Emmaline Life, RN Outcome: Progressing 08/21/2021 1224 by Emmaline Life, RN Outcome: Progressing

## 2021-08-21 NOTE — Plan of Care (Signed)
  Problem: Health Behavior/Discharge Planning: Goal: Ability to manage health-related needs will improve Outcome: Progressing   Problem: Clinical Measurements: Goal: Ability to maintain clinical measurements within normal limits will improve Outcome: Progressing   

## 2021-08-21 NOTE — Progress Notes (Signed)
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  Patient was treated with IV steroids/broad-spectrum antibiotics-Hospital course was complicated by worsening hypoxemia-after steroid dosage was de-escalated.  He underwent further evaluation-and was found to have not developed a antibody response despite COVID-vaccine x3.  ID/PCCM was consulted-patient steroid dosage was reescalated-and transfused 1 unit of COVID convalescent plasma.  See below for further details.  Significant events: 12/05>> COVID-19 positive on home testing 12/19>> admit for shortness of breath-COVID-19 PCR positive.  CT value of 28.6. 12/24>> worsening hypoxemia-started on salter high flow.  PCCM consulted. 12/24>> SARS-COV-2-Ab IgG nonreactive (s/p 3 vaccines, natural infection) 12/25>> remains on salter high flow- 10-15 L, ID consulted-convalescent plasma ordered.  CTA chest negative for PE   Subjective: Better-on 3-4 L of oxygen at rest.  Coughing spells have improved.  Objective: Vitals: Blood pressure 110/63, pulse 66, temperature 97.6 F (36.4 C), temperature source Oral, resp. rate (!) 22, height 5\' 7"  (1.702 m), weight 85.7 kg, SpO2 98 %.   Physical exam: Gen Exam:Alert awake-not in any distress HEENT:atraumatic, normocephalic Chest: Few bibasilar rales. CVS:S1S2 regular Abdomen:soft non tender, non  distended Extremities:no edema Neurology: Non focal Skin: no rash    Recent Labs    08/19/21 0148 08/20/21 0122  DDIMER 1.97* 1.35*  CRP 6.5* 3.4*     Assessment & Plan: Acute hypoxic respiratory failure due to COVID-19 pneumonitis: Improving-remains on IV steroids-received COVID convalescent plasma on 12/27.  Volume status stable-does not require diuretics today.  Continue supportive care and attempt to titrate down FiO2.   History of ILD/microscopic polyangiitis: On Rituxan every 6 months (last dose June 2022)-we will schedule to get a injection earlier this month-however has now been postponed due to COVID-19 diagnoses.  See above.  Elevated D-dimer: Likely due to COVID-19 infection associated inflammation-lower extremity Doppler on 12/24 negative for DVT-CTA chest on 12/25 negative for VTE.  Phylactic Lovenox-we will switch to daily dosing today.  Leukocytosis: Likely due to steroids-cultures negative so far-continue to monitor.  CKD stage IIIa: Creatinine at baseline-watch closely.   Hyponatremia: Mild-improved-monitor periodically.  CAD: No anginal symptoms-continue aspirin/statin.  HLD: Continue statin  DM-2 (A1c 6.3 on 12/14) with uncontrolled hyperglycemia due to steroids: CBGs appear stable-continue Semglee 22 units daily, 8 units of NovoLog with meals and SSI.       Recent Labs    08/20/21 2042 08/21/21 0736 08/21/21 1152  GLUCAP 240* 114* 136*     Peripheral neuropathy: Likely due to DM-continue Neurontin  HLD: Continue statin  GERD: Continue PPI  DVT prophylaxis: Lovenox  Code Status: 12/27>> patient wants to switch from DNR to DNI Family Communication: Spouse at bedside. Disposition:   Status is: Inpatient  Remains inpatient appropriate because: IV ABX and steroids-if clinical improvement continues-Home in the next few days.    Consultants:  none   Data Reviewed: I have personally reviewed following labs and imaging studies  CBC: Recent  Labs  Lab 08/15/21 0051 08/17/21  0174 08/18/21 0119 08/19/21 0148 08/20/21 0122  WBC 15.6* 14.7* 12.0* 17.2* 15.8*  HGB 11.7* 11.8* 11.7* 11.9* 11.6*  HCT 33.6* 35.0* 34.8* 34.9* 33.3*  MCV 82.2 83.5 84.3 85.7 88.8  PLT 281 310 320 383 371     Basic Metabolic Panel: Recent Labs  Lab 08/15/21 0051 08/17/21 0204 08/18/21 0119 08/19/21 0148 08/19/21 0435 08/20/21 0122  NA 131* 134* 135 135  --  136  K 4.4 4.5 4.3 4.8  --  4.8  CL 102 105 105 104  --  107  CO2 20* 22 22 21*  --  22  GLUCOSE 168* 124* 185* 171*  --  104*  BUN 25* 29* 39* 42*  --  34*  CREATININE 1.20 1.28* 1.32* 1.22  --  1.20  CALCIUM 7.9* 7.9* 7.8* 7.7*  --  7.8*  MG  --   --   --   --  2.6*  --      GFR: Estimated Creatinine Clearance: 48.8 mL/min (by C-G formula based on SCr of 1.2 mg/dL).  Liver Function Tests: Recent Labs  Lab 08/18/21 0119 08/19/21 0148 08/20/21 0122  AST 43* 55* 58*  ALT 82* 81* 91*  ALKPHOS 146* 127* 144*  BILITOT 1.3* 1.1 1.0  PROT 4.9* 4.7* 4.8*  ALBUMIN 2.3* 2.1* 2.2*     CBG: Recent Labs  Lab 08/20/21 1143 08/20/21 1545 08/20/21 2042 08/21/21 0736 08/21/21 1152  GLUCAP 174* 155* 240* 114* 136*      Recent Results (from the past 240 hour(s))  Resp Panel by RT-PCR (Flu A&B, Covid) Nasopharyngeal Swab     Status: Abnormal   Collection Time: 08/12/21 11:11 AM   Specimen: Nasopharyngeal Swab; Nasopharyngeal(NP) swabs in vial transport medium  Result Value Ref Range Status   SARS Coronavirus 2 by RT PCR POSITIVE (A) NEGATIVE Final    Comment: (NOTE) SARS-CoV-2 target nucleic acids are DETECTED.  The SARS-CoV-2 RNA is generally detectable in upper respiratory specimens during the acute phase of infection. Positive results are indicative of the presence of the identified virus, but do not rule out bacterial infection or co-infection with other pathogens not detected by the test. Clinical correlation with patient history and other diagnostic information  is necessary to determine patient infection status. The expected result is Negative.  Fact Sheet for Patients: EntrepreneurPulse.com.au  Fact Sheet for Healthcare Providers: IncredibleEmployment.be  This test is not yet approved or cleared by the [place] FDA and  has been authorized for detection and/or diagnosis of SARS-CoV-2 by FDA under an Emergency Use Authorization (EUA).  This EUA will remain in effect (meaning this test can be used) for the duration of  the COVID-19 declaration under Section 564(b)(1) of the A ct, 21 U.S.C. section 360bbb-3(b)(1), unless the authorization is terminated or revoked sooner.     Influenza A by PCR NEGATIVE NEGATIVE Final   Influenza B by PCR NEGATIVE NEGATIVE Final    Comment: (NOTE) The Xpert Xpress SARS-CoV-2/FLU/RSV plus assay is intended as an aid in the diagnosis of influenza from Nasopharyngeal swab specimens and should not be used as a sole basis for treatment. Nasal washings and aspirates are unacceptable for Xpert Xpress SARS-CoV-2/FLU/RSV testing.  Fact Sheet for Patients: EntrepreneurPulse.com.au  Fact Sheet for Healthcare Providers: IncredibleEmployment.be  This test is not yet approved or cleared by the [place] FDA and has been authorized for detection and/or diagnosis of SARS-CoV-2 by FDA under an Emergency Use Authorization (EUA). This EUA will remain in effect (meaning this  test can be used) for the duration of the COVID-19 declaration under Section 564(b)(1) of the Act, 21 U.S.C. section 360bbb-3(b)(1), unless the authorization is terminated or revoked.  Performed at [facility], [place] [address] [place], [place] [address]   Culture, blood (routine x 2)     Status: None   Collection Time: 08/12/21  9:34 PM   Specimen: BLOOD LEFT HAND  Result Value Ref Range Status   Specimen Description BLOOD LEFT HAND  Final   Special Requests    Final    BOTTLES DRAWN AEROBIC AND ANAEROBIC Blood Culture adequate volume   Culture   Final    NO GROWTH 5 DAYS Performed at [facility], [place] [address], [place], [place] [address]    Report Status 08/17/2021 FINAL  Final  Respiratory (~20 pathogens) panel by PCR     Status: None   Collection Time: 08/12/21  9:35 PM   Specimen: Nasopharyngeal Swab; Respiratory  Result Value Ref Range Status   Adenovirus NOT DETECTED NOT DETECTED Final   Coronavirus 229E NOT DETECTED NOT DETECTED Final    Comment: (NOTE) The Coronavirus on the Respiratory Panel, DOES NOT test for the novel  Coronavirus (2019 nCoV)    Coronavirus HKU1 NOT DETECTED NOT DETECTED Final   Coronavirus NL63 NOT DETECTED NOT DETECTED Final   Coronavirus OC43 NOT DETECTED NOT DETECTED Final   Metapneumovirus NOT DETECTED NOT DETECTED Final   Rhinovirus / Enterovirus NOT DETECTED NOT DETECTED Final   Influenza A NOT DETECTED NOT DETECTED Final   Influenza B NOT DETECTED NOT DETECTED Final   Parainfluenza Virus 1 NOT DETECTED NOT DETECTED Final   Parainfluenza Virus 2 NOT DETECTED NOT DETECTED Final   Parainfluenza Virus 3 NOT DETECTED NOT DETECTED Final   Parainfluenza Virus 4 NOT DETECTED NOT DETECTED Final   Respiratory Syncytial Virus NOT DETECTED NOT DETECTED Final   Bordetella pertussis NOT DETECTED NOT DETECTED Final   Bordetella Parapertussis NOT DETECTED NOT DETECTED Final   Chlamydophila pneumoniae NOT DETECTED NOT DETECTED Final   Mycoplasma pneumoniae NOT DETECTED NOT DETECTED Final    Comment: Performed at [facility], [place] [address] [place], [place] [address]  Culture, blood (routine x 2)     Status: None   Collection Time: 08/12/21  9:39 PM   Specimen: BLOOD RIGHT HAND  Result Value Ref Range Status   Specimen Description BLOOD RIGHT HAND  Final   Special Requests   Final    BOTTLES DRAWN AEROBIC AND ANAEROBIC Blood Culture adequate volume   Culture   Final    NO GROWTH 5 DAYS Performed  at [facility], [place] [address] [place], [place] [address]    Report Status 08/17/2021 FINAL  Final  MRSA Next Gen by PCR, Nasal     Status: None   Collection Time: 08/12/21 10:42 PM   Specimen: Nasal Mucosa; Nasal Swab  Result Value Ref Range Status   MRSA by PCR Next Gen NOT DETECTED NOT DETECTED Final    Comment: (NOTE) The GeneXpert MRSA Assay (FDA approved for NASAL specimens only), is one component of a comprehensive MRSA colonization surveillance program. It is not intended to diagnose MRSA infection nor to guide or monitor treatment for MRSA infections. Test performance is not FDA approved in patients less than 46 years old. Performed at [facility], [place] [address], [place], [place] [address]           Radiology Studies: No results  found.      Scheduled Meds:  acetaminophen  650 mg Oral Once   vitamin C  500 mg Oral Daily   aspirin EC  81 mg Oral Daily   benzonatate  200 mg Oral TID   enoxaparin (LOVENOX) injection  40 mg Subcutaneous Q12H   gabapentin  400 mg Oral BID   guaiFENesin  600 mg Oral BID   insulin aspart  0-15 Units Subcutaneous TID AC & HS   insulin aspart  8 Units Subcutaneous TID WC   insulin glargine-yfgn  22 Units Subcutaneous Daily   methylPREDNISolone (SOLU-MEDROL) injection  60 mg Intravenous Daily   pantoprazole  40 mg Oral Q1200   rosuvastatin  20 mg Oral QHS   zinc sulfate  220 mg Oral Daily   Continuous Infusions:     LOS: 9 days       Oren Binet, MD [facility]   To contact the attending provider between 7A-7P or the covering provider during after hours 7P-7A, please log into the web site [email or web address] and access using universal [place] password for that web site. If you do not have the password, please call the hospital operator.  08/21/2021, 12:06 PM

## 2021-08-22 DIAGNOSIS — J189 Pneumonia, unspecified organism: Secondary | ICD-10-CM | POA: Diagnosis not present

## 2021-08-22 DIAGNOSIS — E1142 Type 2 diabetes mellitus with diabetic polyneuropathy: Secondary | ICD-10-CM | POA: Diagnosis not present

## 2021-08-22 DIAGNOSIS — N1831 Chronic kidney disease, stage 3a: Secondary | ICD-10-CM | POA: Diagnosis not present

## 2021-08-22 DIAGNOSIS — U071 COVID-19: Secondary | ICD-10-CM | POA: Diagnosis not present

## 2021-08-22 LAB — BASIC METABOLIC PANEL
Anion gap: 6 (ref 5–15)
BUN: 35 mg/dL — ABNORMAL HIGH (ref 8–23)
CO2: 25 mmol/L (ref 22–32)
Calcium: 8.1 mg/dL — ABNORMAL LOW (ref 8.9–10.3)
Chloride: 107 mmol/L (ref 98–111)
Creatinine, Ser: 1.18 mg/dL (ref 0.61–1.24)
GFR, Estimated: >60 mL/min (ref >60)
Glucose, Bld: 97 mg/dL (ref 70–99)
Potassium: 4.9 mmol/L (ref 3.5–5.1)
Sodium: 138 mmol/L (ref 135–145)

## 2021-08-22 LAB — CBC
HCT: 34 % — ABNORMAL LOW (ref 39.0–52.0)
Hemoglobin: 11.9 g/dL — ABNORMAL LOW (ref 13.0–17.0)
MCH: 31.2 pg (ref 26.0–34.0)
MCHC: 35 g/dL (ref 30.0–36.0)
MCV: 89.2 fL (ref 80.0–100.0)
Platelets: 380 10*3/uL (ref 150–400)
RBC: 3.81 MIL/uL — ABNORMAL LOW (ref 4.22–5.81)
RDW: 14.6 % (ref 11.5–15.5)
WBC: 18.5 10*3/uL — ABNORMAL HIGH (ref 4.0–10.5)
nRBC: 0.1 % (ref 0.0–0.2)

## 2021-08-22 LAB — GLUCOSE, CAPILLARY
Glucose-Capillary: 78 mg/dL (ref 70–99)
Glucose-Capillary: 257 mg/dL — ABNORMAL HIGH (ref 70–99)
Glucose-Capillary: 142 mg/dL — ABNORMAL HIGH (ref 70–99)
Glucose-Capillary: 323 mg/dL — ABNORMAL HIGH (ref 70–99)

## 2021-08-22 LAB — C-REACTIVE PROTEIN: CRP: 2.4 mg/dL — ABNORMAL HIGH (ref <1.0)

## 2021-08-22 NOTE — Plan of Care (Signed)
  Problem: Health Behavior/Discharge Planning: Goal: Ability to manage health-related needs will improve Outcome: Progressing   

## 2021-08-22 NOTE — Progress Notes (Signed)
Physical Therapy Treatment Patient Details Name: Kerry Matthews MRN: [id number] DOB: Feb 23, 1936 Today's Date: 08/22/2021   History of Present Illness 85 y.o. M admitted 08/12/2021 with multifocal PNA. COVID-19 (+) 12/5. PMHx: ILD, pulmonary fibrosis, HTN, HLD, DM2, diabetic polyneuropathy,coronary artery disease, chronic LBP.    PT Comments    Pt in recliner on arrival, wife present in room. He reports having just recovered his breathing after transferring out of bed to [facility] and then to recliner. Pt/wife report he had been weaned down to 4L O2 this AM (prior to mobility) but is now back up to 8L. SpO2 90% at rest. Declining gait trial but agreeable to LE exercises. Remained on 8L throughout session. Multiple rest breaks required during exercises to recover O2 back to 90%. Initial just dropping to 87-89%, quick recovery to 90% with pursed lip breathing. Toward end of session, pt with drop to 85% then began coughing. 3-5 minute recovery to return to 90%. Pt declining further exercises/activity at that time. Pt remained in recliner at end of session.     Recommendations for follow up therapy are one component of a multi-disciplinary discharge planning process, led by the attending physician.  Recommendations may be updated based on patient status, additional functional criteria and insurance authorization.  Follow Up Recommendations  Home health PT     Assistance Recommended at Discharge Intermittent Supervision/Assistance  Equipment Recommendations  BSC/3in1    Recommendations for Other Services       Precautions / Restrictions Precautions Precautions: Other (comment) Precaution Comments: watch O2, MD would like mobility on high concentration O2 to decrease recovery time     Mobility  Bed Mobility               General bed mobility comments: Pt up in recliner    Transfers                        Ambulation/Gait                   Stairs              Wheelchair Mobility    Modified Rankin (Stroke Patients Only)       Balance                                            Cognition Arousal/Alertness: Awake/alert Behavior During Therapy: WFL for tasks assessed/performed Overall Cognitive Status: Within Functional Limits for tasks assessed                                 General Comments: requires frequent cues to correct breathing technique and avoiding mouth breathing        Exercises General Exercises - Lower Extremity Ankle Circles/Pumps: AROM;Both;20 reps Gluteal Sets: AROM;Both;10 reps Heel Slides: AROM;Right;Left;10 reps Hip ABduction/ADduction: AROM;Right;Left;10 reps    General Comments        Pertinent Vitals/Pain Pain Assessment: Faces Faces Pain Scale: Hurts little more Pain Location: chest with coughing Pain Descriptors / Indicators: Grimacing Pain Intervention(s): Limited activity within patient's tolerance    Home Living                          Prior Function  PT Goals (current goals can now be found in the care plan section) Acute Rehab PT Goals Patient Stated Goal: home Progress towards PT goals: Progressing toward goals    Frequency    Min 3X/week      PT Plan Current plan remains appropriate    Co-evaluation              AM-PAC PT "6 Clicks" Mobility   Outcome Measure  Help needed turning from your back to your side while in a flat bed without using bedrails?: None Help needed moving from lying on your back to sitting on the side of a flat bed without using bedrails?: None Help needed moving to and from a bed to a chair (including a wheelchair)?: A Little Help needed standing up from a chair using your arms (e.g., wheelchair or bedside chair)?: A Little Help needed to walk in hospital room?: A Little Help needed climbing 3-5 steps with a railing? : A Little 6 Click Score: 20    End of Session Equipment Utilized  During Treatment: Oxygen Activity Tolerance: Treatment limited secondary to medical complications (Comment) (coughing, desat) Patient left: in chair;with call bell/phone within reach;with family/visitor present Nurse Communication: Mobility status PT Visit Diagnosis: Difficulty in walking, not elsewhere classified (R26.2);Other abnormalities of gait and mobility (R26.89)     Time: 2841-3244 PT Time Calculation (min) (ACUTE ONLY): 18 min  Charges:  $Therapeutic Exercise: 8-22 mins                     Lorrin Goodell, PT  Office # [phone] Pager [phone]    Lorriane Shire 08/22/2021, 11:25 AM

## 2021-08-22 NOTE — Progress Notes (Signed)
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  He has been treated for bacterial pneumonia with cefepime/azithromycin since 12/19, . I/O net positive 3.5L but may be inaccurate. Only 1 charted weight.  Rising O2 requirement may coincide with decreasing steroid dose. CXR today seems to show more extensive alveolar opacities relative to 12/19 CXR.  He was last seen in clinic with Dr. Vaughan Browner 10/20/19.  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 Requiring 15L with activity with desaturations to 85% with recovery with rest  Interim History / Subjective:  Increased resting O2 requirement. Recently transferred to chair. On 9L at time of visit. Wife at bedside with questions regarding clinical course  Objective   Blood pressure 110/63, pulse 72, temperature 97.9 F (36.6 C), temperature source Oral, resp. rate (!) 25, height 5\' 7"  (1.702 m), weight 85.7 kg, SpO2 96 %.       No intake or output data in the 24 hours ending 08/22/21 1320  Filed Weights   08/13/21 0700  Weight: 85.7 kg   Physical Exam: General: Elderly and chronically  ill-appearing, no acute distress HENT: [place], AT, OP clear, MMM Eyes: EOMI, no scleral icterus Respiratory: Bibasilar inspiratory crackles Cardiovascular: RRR, -M/R/G, no JVD Extremities:-Edema,-tenderness Neuro: AAO x4, CNII-XII grossly intact  Cr improving.  Increasing leukocytosis to 18 Stable anemia  CTA Chest 08/12/21 reviewed by me remarkable for extensive interval ggo with perivascular, peripheral and dependent predominance  TTE 2020 indeterminate for diastolic dysfunction   Resolved Hospital Problem list     Assessment & Plan:   # Acute hypoxic respiratory failure # Covid-19 pneumonia # ILD flare Could have ongoing infection-related pneumonitis given ritux exposure and positive PCR, organizing pneumonia from covid-19 or underlying ILD (although extrapulmonary symptoms have been quiescent) - has heightened susceptibility to either with underlying ILD. Worsening with reduction in steroid dose also makes me wonder about DAH given blood tinged sputum, perivascular distribution of ggo, and some drop in Hb. While CTA negative 12/19 and US DVT neg, given abrupt rise in O2 requirement that probably isn't fully explained by worsening airspace disease, sharp rise in d-dimer, agree that PE is a concern.  08/20/21 -Improving inflammatory markers. At rest O2 improved to 4L. Though he will have increased O2 requirement with activity.  Expect slow recovery 08/21/21 - Requiring 15L with activity with desaturations to 85% with <2 min recovery with rest 08/22/21 - Increased resting O2 to 6-9L  -Solumedrol 60 mg daily with plan for slow titration (decrease by 10 mg every six days) -Consider gentle diuresis, strict I&Os -Agree with empiric treatment of PE, watch for worsening hemoptysis -Continue PT, OOB and chair position during the day  Pulmonary will intermittently follow. Please contact team if  needed over the weekend  Best Practice (right click and "Reselect all SmartList Selections"  daily)    Last date of multidisciplinary goals of care discussion [12/24. Discussed with patient, wife and daughter that if he were to require mechanical ventilation I thought that it would likely only prolong his death rather than provide opportunity for significant functional recovery or at best would result in prolonged, rocky recovery away from home. He agreed that he would want to be DNR.]  Labs   CBC: Recent Labs  Lab 08/17/21 0204 08/18/21 0119 08/19/21 0148 08/20/21 0122 08/22/21 0059  WBC 14.7* 12.0* 17.2* 15.8* 18.5*  HGB 11.8* 11.7* 11.9* 11.6* 11.9*  HCT 35.0* 34.8* 34.9* 33.3* 34.0*  MCV 83.5 84.3 85.7 88.8 89.2  PLT 310 320 383 371 397    Basic Metabolic Panel: Recent Labs  Lab 08/17/21 0204 08/18/21 0119 08/19/21 0148 08/19/21 0435 08/20/21 0122 08/22/21 0059  NA 134* 135 135  --  136 138  K 4.5 4.3 4.8  --  4.8 4.9  CL 105 105 104  --  107 107  CO2 22 22 21*  --  22 25  GLUCOSE 124* 185* 171*  --  104* 97  BUN 29* 39* 42*  --  34* 35*  CREATININE 1.28* 1.32* 1.22  --  1.20 1.18  CALCIUM 7.9* 7.8* 7.7*  --  7.8* 8.1*  MG  --   --   --  2.6*  --   --    GFR: Estimated Creatinine Clearance: 49.6 mL/min (by C-G formula based on SCr of 1.18 mg/dL). Recent Labs  Lab 08/17/21 1109 08/18/21 0119 08/19/21 0148 08/20/21 0122 08/22/21 0059  PROCALCITON 0.27  --   --   --   --   WBC  --  12.0* 17.2* 15.8* 18.5*    Liver Function Tests: Recent Labs  Lab 08/18/21 0119 08/19/21 0148 08/20/21 0122  AST 43* 55* 58*  ALT 82* 81* 91*  ALKPHOS 146* 127* 144*  BILITOT 1.3* 1.1 1.0  PROT 4.9* 4.7* 4.8*  ALBUMIN 2.3* 2.1* 2.2*   No results for input(s): LIPASE, AMYLASE in the last 168 hours. No results for input(s): AMMONIA in the last 168 hours.  ABG No results found for: PHART, PCO2ART, PO2ART, HCO3, TCO2, ACIDBASEDEF, O2SAT   Coagulation Profile: No results for input(s): INR, PROTIME in the last 168 hours.  Cardiac Enzymes: No results for  input(s): CKTOTAL, CKMB, CKMBINDEX, TROPONINI in the last 168 hours.  HbA1C: Hgb A1c MFr Bld  Date/Time Value Ref Range Status  08/07/2021 10:31 AM 6.3 (H) 4.8 - 5.6 % Final    Comment:             Prediabetes: 5.7 - 6.4          Diabetes: >6.4          Glycemic control for adults with diabetes: <7.0   04/04/2021 08:33 AM 6.7 (H) 4.8 - 5.6 % Final    Comment:             Prediabetes: 5.7 - 6.4          Diabetes: >6.4          Glycemic control for adults with diabetes: <7.0     CBG: Recent Labs  Lab 08/21/21 1152 08/21/21 1625 08/21/21 2047 08/22/21 0739 08/22/21 1214  GLUCAP 136* 244* 242* 78 257*   Critical care time: N/A    Care Time: 25 min  Rodman Pickle, M.D. [facility] Pulmonary/Critical Care Medicine  08/22/2021 1:21 PM   See Amion for personal pager For hours between 7 PM to 7 AM, please call Elink for urgent questions

## 2021-08-22 NOTE — Progress Notes (Addendum)
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  Patient was treated with IV steroids/broad-spectrum antibiotics-Hospital course was complicated by worsening hypoxemia-after steroid dosage was de-escalated.  He underwent further evaluation-and was found to have not developed a antibody response despite COVID-vaccine x3.  ID/PCCM was consulted-patient steroid dosage was reescalated-and transfused 1 unit of COVID convalescent plasma.  See below for further details.  Significant events: 12/05>> COVID-19 positive on home testing 12/19>> admit for shortness of breath-COVID-19 PCR positive.  CT value of 28.6. 12/24>> worsening hypoxemia-started on salter high flow.  PCCM consulted. 12/24>> SARS-COV-2-Ab IgG nonreactive (s/p 3 vaccines, natural infection) 12/25>> remains on salter high flow- 10-15 L, ID consulted-convalescent plasma ordered.  CTA chest negative for PE   Subjective: Anywhere from 3-6 L at rest-requires 15 L with activity.  Comfortable-some cough but not in any distress.  Objective: Vitals: Blood pressure 110/63, pulse 72, temperature 97.9 F (36.6 C), temperature source Oral, resp. rate (!) 25, height 5\' 7"  (1.702 m), weight 85.7 kg, SpO2 96 %.   Physical exam: Gen Exam:Alert awake-not in any distress HEENT:atraumatic, normocephalic Chest: B/L clear to auscultation anteriorly CVS:S1S2  regular Abdomen:soft non tender, non distended Extremities:no edema Neurology: Non focal Skin: no rash    Recent Labs    08/20/21 0122 08/21/21 1224 08/22/21 0059  DDIMER 1.35*  --   --   CRP 3.4* 2.3* 2.4*     Assessment & Plan: Acute hypoxic respiratory failure due to COVID-19 pneumonitis: Slowly improving-oxygen levels continue to fluctuate-anywhere from 3-6 L at rest to 15 L with significant activity.  Has completed 1 week of IV cefepime.  Received convalescent plasma on 12/27.  On IV steroids-no signs of volume overload-plans are to continue with supportive care and attempt to titrate down FiO2 as tolerated.  Will likely require home O2-we will need to be assessed when closer to discharge  History of ILD/microscopic polyangiitis: On Rituxan every 6 months (last dose June 2022)-we will schedule to get a injection earlier this month-however has now been postponed due to COVID-19 diagnoses.  See above.  Elevated D-dimer: Likely due to COVID-19 infection associated inflammation-lower extremity Doppler on 12/24 negative for DVT-CTA chest on 12/25 negative for VTE.  On prophylactic heparin.  Leukocytosis: Likely due to steroids-cultures negative so far-continue to monitor.  CKD stage IIIa: Creatinine at baseline-watch closely.   Hyponatremia: Resolved.  Monitor periodically.  CAD: No anginal symptoms-continue aspirin/statin.  HLD: Continue statin  DM-2 (A1c 6.3 on 12/14) with uncontrolled hyperglycemia due to steroids: CBGs appear stable-continue Semglee 22 units daily, 8 units of NovoLog with meals and SSI.       Recent Labs    08/21/21 2047 08/22/21 0739 08/22/21 1214  GLUCAP 242* 78 257*     Peripheral neuropathy: Likely due to DM-continue Neurontin  HLD: Continue statin  GERD: Continue PPI  DVT prophylaxis: Lovenox  Code Status: 12/27>> patient wants to switch from DNR to DNI Family Communication: Spouse at  bedside. Disposition:   Status is:  Inpatient  Remains inpatient appropriate because: IV ABX and steroids-if clinical improvement continues-Home in the next few days.    Consultants:  none   Data Reviewed: I have personally reviewed following labs and imaging studies  CBC: Recent Labs  Lab 08/17/21 0204 08/18/21 0119 08/19/21 0148 08/20/21 0122 08/22/21 0059  WBC 14.7* 12.0* 17.2* 15.8* 18.5*  HGB 11.8* 11.7* 11.9* 11.6* 11.9*  HCT 35.0* 34.8* 34.9* 33.3* 34.0*  MCV 83.5 84.3 85.7 88.8 89.2  PLT 310 320 383 371 380     Basic Metabolic Panel: Recent Labs  Lab 08/17/21 0204 08/18/21 0119 08/19/21 0148 08/19/21 0435 08/20/21 0122 08/22/21 0059  NA 134* 135 135  --  136 138  K 4.5 4.3 4.8  --  4.8 4.9  CL 105 105 104  --  107 107  CO2 22 22 21*  --  22 25  GLUCOSE 124* 185* 171*  --  104* 97  BUN 29* 39* 42*  --  34* 35*  CREATININE 1.28* 1.32* 1.22  --  1.20 1.18  CALCIUM 7.9* 7.8* 7.7*  --  7.8* 8.1*  MG  --   --   --  2.6*  --   --      GFR: Estimated Creatinine Clearance: 49.6 mL/min (by C-G formula based on SCr of 1.18 mg/dL).  Liver Function Tests: Recent Labs  Lab 08/18/21 0119 08/19/21 0148 08/20/21 0122  AST 43* 55* 58*  ALT 82* 81* 91*  ALKPHOS 146* 127* 144*  BILITOT 1.3* 1.1 1.0  PROT 4.9* 4.7* 4.8*  ALBUMIN 2.3* 2.1* 2.2*     CBG: Recent Labs  Lab 08/21/21 1152 08/21/21 1625 08/21/21 2047 08/22/21 0739 08/22/21 1214  GLUCAP 136* 244* 242* 78 257*      Recent Results (from the past 240 hour(s))  Culture, blood (routine x 2)     Status: None   Collection Time: 08/12/21  9:34 PM   Specimen: BLOOD LEFT HAND  Result Value Ref Range Status   Specimen Description BLOOD LEFT HAND  Final   Special Requests   Final    BOTTLES DRAWN AEROBIC AND ANAEROBIC Blood Culture adequate volume   Culture   Final    NO GROWTH 5 DAYS Performed at [facility], [place] [address] [place], [place] [address]    Report Status 08/17/2021 FINAL  Final  Respiratory (~20 pathogens)  panel by PCR     Status: None   Collection Time: 08/12/21  9:35 PM   Specimen: Nasopharyngeal Swab; Respiratory  Result Value Ref Range Status   Adenovirus NOT DETECTED NOT DETECTED Final   Coronavirus 229E NOT DETECTED NOT DETECTED Final    Comment: (NOTE) The Coronavirus on the Respiratory Panel, DOES NOT test for the novel  Coronavirus (2019 nCoV)    Coronavirus HKU1 NOT DETECTED NOT DETECTED Final   Coronavirus NL63 NOT DETECTED NOT DETECTED Final   Coronavirus OC43 NOT DETECTED NOT DETECTED Final   Metapneumovirus NOT DETECTED NOT DETECTED Final   Rhinovirus / Enterovirus NOT DETECTED NOT DETECTED Final   Influenza A NOT DETECTED NOT DETECTED Final   Influenza B NOT DETECTED NOT DETECTED Final   Parainfluenza Virus 1 NOT DETECTED NOT DETECTED Final   Parainfluenza Virus 2 NOT DETECTED NOT DETECTED Final   Parainfluenza Virus 3 NOT DETECTED NOT DETECTED Final   Parainfluenza Virus 4 NOT DETECTED NOT DETECTED Final   Respiratory Syncytial Virus NOT DETECTED NOT DETECTED Final  Bordetella pertussis NOT DETECTED NOT DETECTED Final   Bordetella Parapertussis NOT DETECTED NOT DETECTED Final   Chlamydophila pneumoniae NOT DETECTED NOT DETECTED Final   Mycoplasma pneumoniae NOT DETECTED NOT DETECTED Final    Comment: Performed at [facility], [place] [address], [place], [place] [address]  Culture, blood (routine x 2)     Status: None   Collection Time: 08/12/21  9:39 PM   Specimen: BLOOD RIGHT HAND  Result Value Ref Range Status   Specimen Description BLOOD RIGHT HAND  Final   Special Requests   Final    BOTTLES DRAWN AEROBIC AND ANAEROBIC Blood Culture adequate volume   Culture   Final    NO GROWTH 5 DAYS Performed at [facility], [place] [address] [place], [place] [address]    Report Status 08/17/2021 FINAL  Final  MRSA Next Gen by PCR, Nasal     Status: None   Collection Time: 08/12/21 10:42 PM   Specimen: Nasal Mucosa; Nasal Swab  Result Value Ref Range Status    MRSA by PCR Next Gen NOT DETECTED NOT DETECTED Final    Comment: (NOTE) The GeneXpert MRSA Assay (FDA approved for NASAL specimens only), is one component of a comprehensive MRSA colonization surveillance program. It is not intended to diagnose MRSA infection nor to guide or monitor treatment for MRSA infections. Test performance is not FDA approved in patients less than 59 years old. Performed at [facility], [place] [address] [place], [place] [address]           Radiology Studies: No results found.      Scheduled Meds:  acetaminophen  650 mg Oral Once   vitamin C  500 mg Oral Daily   aspirin EC  81 mg Oral Daily   benzonatate  200 mg Oral TID   enoxaparin (LOVENOX) injection  40 mg Subcutaneous Q24H   gabapentin  400 mg Oral BID   guaiFENesin  600 mg Oral BID   insulin aspart  0-15 Units Subcutaneous TID AC & HS   insulin aspart  8 Units Subcutaneous TID WC   insulin glargine-yfgn  22 Units Subcutaneous Daily   methylPREDNISolone (SOLU-MEDROL) injection  60 mg Intravenous Daily   Followed by   Derrill Memo ON 08/26/2021] methylPREDNISolone (SOLU-MEDROL) injection  50 mg Intravenous Daily   Followed by   Derrill Memo ON 09/01/2021] methylPREDNISolone (SOLU-MEDROL) injection  40 mg Intravenous Daily   pantoprazole  40 mg Oral Q1200   rosuvastatin  20 mg Oral QHS   zinc sulfate  220 mg Oral Daily   Continuous Infusions:     LOS: 10 days       Oren Binet, MD [facility]   To contact the attending provider between 7A-7P or the covering provider during after hours 7P-7A, please log into the web site [email or web address] and access using universal [place] password for that web site. If you do not have the password, please call the hospital operator.  08/22/2021, 2:26 PM

## 2021-08-23 DIAGNOSIS — E1142 Type 2 diabetes mellitus with diabetic polyneuropathy: Secondary | ICD-10-CM | POA: Diagnosis not present

## 2021-08-23 DIAGNOSIS — U071 COVID-19: Secondary | ICD-10-CM | POA: Diagnosis not present

## 2021-08-23 DIAGNOSIS — N1831 Chronic kidney disease, stage 3a: Secondary | ICD-10-CM | POA: Diagnosis not present

## 2021-08-23 DIAGNOSIS — J189 Pneumonia, unspecified organism: Secondary | ICD-10-CM | POA: Diagnosis not present

## 2021-08-23 LAB — D-DIMER, QUANTITATIVE: D-Dimer, Quant: 2.13 ug/mL-FEU — ABNORMAL HIGH (ref 0.00–0.50)

## 2021-08-23 LAB — C-REACTIVE PROTEIN: CRP: 4.4 mg/dL — ABNORMAL HIGH (ref <1.0)

## 2021-08-23 LAB — GLUCOSE, CAPILLARY
Glucose-Capillary: 108 mg/dL — ABNORMAL HIGH (ref 70–99)
Glucose-Capillary: 149 mg/dL — ABNORMAL HIGH (ref 70–99)
Glucose-Capillary: 302 mg/dL — ABNORMAL HIGH (ref 70–99)
Glucose-Capillary: 188 mg/dL — ABNORMAL HIGH (ref 70–99)

## 2021-08-23 LAB — BASIC METABOLIC PANEL
Anion gap: 5 (ref 5–15)
BUN: 33 mg/dL — ABNORMAL HIGH (ref 8–23)
CO2: 24 mmol/L (ref 22–32)
Calcium: 7.7 mg/dL — ABNORMAL LOW (ref 8.9–10.3)
Chloride: 106 mmol/L (ref 98–111)
Creatinine, Ser: 1.06 mg/dL (ref 0.61–1.24)
GFR, Estimated: >60 mL/min (ref >60)
Glucose, Bld: 165 mg/dL — ABNORMAL HIGH (ref 70–99)
Potassium: 5 mmol/L (ref 3.5–5.1)
Sodium: 135 mmol/L (ref 135–145)

## 2021-08-23 MED ORDER — PREDNISONE 20 MG PO TABS
60.0000 mg | ORAL_TABLET | Freq: Every day | ORAL | Status: DC
  Administered 2021-08-23 – 2021-08-25 (×3): 60 mg via ORAL
  Filled 2021-08-23 (×3): qty 3

## 2021-08-23 NOTE — TOC Transition Note (Deleted)
Transition of Care [facility]) - CM/SW Discharge Note   Patient Details  Name: Joevanni Roddey MRN: [id number] Date of Birth: 11-16-1935  [facility]) CM/SW Contact:  Tom-Johnson, Renea Ee, RN Phone Number: 08/23/2021, 12:55 PM   Clinical Narrative:     Patient is scheduled for discharge today. Active with [facility] for home health PT services. CM called and spoke with Gerda Diss [phone]) and notified her of patient's discharge. Patient states he has necessary DME's at home. Son to transport at discharge. No further TOC needs noted.   Final next level of care: [place] Barriers to Discharge: Barriers Resolved   Patient Goals and CMS Choice Patient states their goals for this hospitalization and ongoing recovery are:: To go home CMS Medicare.gov Compare Post Acute Care list provided to:: Patient Choice offered to / list presented to : Patient  Discharge Placement                       Discharge Plan and Services     Post Acute Care Choice: Durable Medical Equipment          DME Arranged: Oxygen DME Agency: [employer] Date DME Agency Contacted: 08/19/21 Time DME Agency Contacted: 2080 Representative spoke with at DME Agency: Dennard Schaumann Arranged: PT [place]: [facility] [place] Date [place]: 08/23/21 Time [place]: 1255 Representative spoke with at [place]: Gerda Diss  Social Determinants of Health ([place]) Interventions     Readmission Risk Interventions Readmission Risk Prevention Plan 07/09/2020  Post Dischage Appt Complete  Medication Screening Complete  Transportation Screening Complete  Some recent data might be hidden

## 2021-08-23 NOTE — Progress Notes (Signed)
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  Patient was treated with IV steroids/broad-spectrum antibiotics-Hospital course was complicated by worsening hypoxemia-after steroid dosage was de-escalated.  He underwent further evaluation-and was found to have not developed a antibody response despite COVID-vaccine x3.  ID/PCCM was consulted-patient steroid dosage was reescalated-and transfused 1 unit of COVID convalescent plasma.  See below for further details.  Significant events: 12/05>> COVID-19 positive on home testing 12/19>> admit for shortness of breath-COVID-19 PCR positive.  CT value of 28.6. 12/24>> worsening hypoxemia-started on salter high flow.  PCCM consulted. 12/24>> SARS-COV-2-Ab IgG nonreactive (s/p 3 vaccines, natural infection) 12/25>> remains on salter high flow- 10-15 L, ID consulted-convalescent plasma ordered.  CTA chest negative for PE   Subjective: Significantly better-requiring very minimal O2 at rest (from room air to 2 L)-but requires around 8 L with activity.  Patient reports significant clinical improvement-and wants to go home.  Spouse/daughter at bedside-are somewhat anxious about discharge plans.  Objective: Vitals: Blood pressure 107/61, pulse 64, temperature (!) 97.5 F (36.4 C), temperature source Oral, resp. rate 20, height 5\' 7"  (1.702 m), weight 85.7 kg, SpO2 97  %.   Physical exam: Gen Exam:Alert awake-not in any distress HEENT:atraumatic, normocephalic Chest: B/L clear to auscultation anteriorly CVS:S1S2 regular Abdomen:soft non tender, non distended Extremities:no edema Neurology: Non focal Skin: no rash    Recent Labs    08/21/21 1224 08/22/21 0059 08/23/21 0127  DDIMER  --   --  2.13*  CRP 2.3* 2.4* 4.4*     Assessment & Plan: Acute hypoxic respiratory failure due to COVID-19 pneumonitis: Has significantly improved with the past few days-at rest either on room air or on just 2 L-but requires around 8 L with activity/movement.  Has completed 1 week of IV cefepime.  Received convalescent plasma on 12/27.  No signs of volume overload.  Discussed with PCCM MD-Dr. Ellison-recommendations are to transition to oral prednisone today-60 mg daily and to decrease by 10 mg every 6 days.  Spouse/daughter at bedside and very anxious about potential discharge plans-have provided extensive reassurance-anticipate that patient will require significant small amount of oxygen with ambulation.  Also expect that he will continue to desaturate somewhat with movement-but over the past several days-has quickly rebounded with rest-and we have been able to titrate him down to just 1-2 L of oxygen in a few minutes.  PCCM will arrange for outpatient follow-up with his primary pulmonologist, will watch him for another day or so and see how he does-if he remains stable and improved-likely discharge this coming Sunday with home oxygen.  History of ILD/microscopic polyangiitis: On Rituxan every 6 months (last dose June 2022)-we will schedule to get a injection earlier this month-however has now been postponed due to COVID-19 diagnoses.  See above.  Elevated D-dimer: Likely due to COVID-19 infection associated inflammation-lower extremity Doppler on 12/24 negative for DVT-CTA chest on 12/25 negative for VTE.  On prophylactic heparin.  Leukocytosis: Likely due  to  steroids-cultures negative so far-continue to monitor.  CKD stage IIIa: Creatinine at baseline-watch closely.   Hyponatremia: Resolved.  Monitor periodically.  CAD: No anginal symptoms-continue aspirin/statin.  HLD: Continue statin  DM-2 (A1c 6.3 on 12/14) with uncontrolled hyperglycemia due to steroids: CBGs appear stable-continue Semglee 22 units daily, 8 units of NovoLog with meals and SSI.       Recent Labs    08/22/21 2124 08/23/21 0747 08/23/21 1129  GLUCAP 323* 108* 149*     Peripheral neuropathy: Likely due to DM-continue Neurontin  HLD: Continue statin  GERD: Continue PPI  DVT prophylaxis: Lovenox  Code Status: 12/27>> patient wants to switch from DNR to DNI Family Communication: Spouse/Daughter at bedside. Disposition:   Status is: Inpatient  Remains inpatient appropriate because: IV ABX and steroids-if clinical improvement continues-Home in the next few days.    Consultants:  none   Data Reviewed: I have personally reviewed following labs and imaging studies  CBC: Recent Labs  Lab 08/17/21 0204 08/18/21 0119 08/19/21 0148 08/20/21 0122 08/22/21 0059  WBC 14.7* 12.0* 17.2* 15.8* 18.5*  HGB 11.8* 11.7* 11.9* 11.6* 11.9*  HCT 35.0* 34.8* 34.9* 33.3* 34.0*  MCV 83.5 84.3 85.7 88.8 89.2  PLT 310 320 383 371 380     Basic Metabolic Panel: Recent Labs  Lab 08/18/21 0119 08/19/21 0148 08/19/21 0435 08/20/21 0122 08/22/21 0059 08/23/21 0127  NA 135 135  --  136 138 135  K 4.3 4.8  --  4.8 4.9 5.0  CL 105 104  --  107 107 106  CO2 22 21*  --  22 25 24   GLUCOSE 185* 171*  --  104* 97 165*  BUN 39* 42*  --  34* 35* 33*  CREATININE 1.32* 1.22  --  1.20 1.18 1.06  CALCIUM 7.8* 7.7*  --  7.8* 8.1* 7.7*  MG  --   --  2.6*  --   --   --      GFR: Estimated Creatinine Clearance: 55.2 mL/min (by C-G formula based on SCr of 1.06 mg/dL).  Liver Function Tests: Recent Labs  Lab 08/18/21 0119 08/19/21 0148 08/20/21 0122  AST 43* 55* 58*   ALT 82* 81* 91*  ALKPHOS 146* 127* 144*  BILITOT 1.3* 1.1 1.0  PROT 4.9* 4.7* 4.8*  ALBUMIN 2.3* 2.1* 2.2*     CBG: Recent Labs  Lab 08/22/21 1214 08/22/21 1638 08/22/21 2124 08/23/21 0747 08/23/21 1129  GLUCAP 257* 142* 323* 108* 149*      No results found for this or any previous visit (from the past 240 hour(s)).         Radiology Studies: No results found.      Scheduled Meds:  acetaminophen  650 mg Oral Once   vitamin C  500 mg Oral Daily   aspirin EC  81 mg Oral Daily   benzonatate  200 mg Oral TID   enoxaparin (LOVENOX) injection  40 mg Subcutaneous Q24H   gabapentin  400 mg Oral BID   guaiFENesin  600 mg Oral BID   insulin aspart  0-15 Units Subcutaneous TID AC & HS   insulin aspart  8 Units Subcutaneous TID WC   insulin glargine-yfgn  22 Units Subcutaneous Daily   pantoprazole  40 mg Oral Q1200   predniSONE  60 mg Oral Q breakfast   rosuvastatin  20 mg Oral QHS   zinc sulfate  220 mg Oral Daily   Continuous Infusions:     LOS: 11 days  Oren Binet, MD [facility]   To contact the attending provider between 7A-7P or the covering provider during after hours 7P-7A, please log into the web site [email or web address] and access using universal [place] password for that web site. If you do not have the password, please call the hospital operator.  08/23/2021, 2:29 PM

## 2021-08-23 NOTE — Progress Notes (Signed)
PCCM Interval Note  Improved oxygenation. Patient wants to go home however wife and daughter concerned about desaturations. Overall his O2 has improved to 2-3L a rest. With moderate exertion he may require up to 10L. Reassured family and discussed clinical course of COVID-19 infection. Expect slow recovery.  Discussed with primary team to trial PO de-escalation prior to discharge. PT will also work with patient tomorrow.  Triage message sent to [facility] for follow-up with Dr. Vaughan Browner in 1-2 weeks.  If patient remains hospitalized on 08/26/21. Pulmonary will come see.  Rodman Pickle, M.D. [facility] Pulmonary/Critical Care Medicine 08/23/2021 2:40 PM

## 2021-08-23 NOTE — Progress Notes (Cosign Needed)
°  °  Durable Medical Equipment  (From admission, onward)           Start     Ordered   08/23/21 1625  For home use only DME Hospital bed  Once       Question Answer Comment  Length of Need 6 Months   Patient has (list medical condition): Severe hypoxia/physical deconditioning from COVID-19 pneumonia.   The above medical condition requires: Patient requires the ability to reposition frequently   Head must be elevated greater than: 45 degrees   Bed type Semi-electric      08/23/21 1627   08/23/21 1433  For home use only DME standard manual wheelchair with seat cushion  Once       Comments: Patient suffers from physical deconditioning which impairs their ability to perform daily activities like bathing, dressing, feeding, grooming, and toileting in the home.  A cane, crutch, or walker will not resolve issue with performing activities of daily living. A wheelchair will allow patient to safely perform daily activities. Patient can safely propel the wheelchair in the home or has a caregiver who can provide assistance. Length of need 6 months . Accessories: elevating leg rests (ELRs), wheel locks, extensions and anti-tippers.   08/23/21 1432   08/23/21 1432  For home use only DME oxygen  Once       Comments: 1-2 L at rest 8 L with ambulation.  Question Answer Comment  Length of Need 6 Months   Mode or (Route) Nasal cannula   Liters per Minute 2   Frequency Continuous (stationary and portable oxygen unit needed)   Oxygen conserving device Yes   Oxygen delivery system Gas      08/23/21 1432

## 2021-08-23 NOTE — Progress Notes (Signed)
Patient requires hospital bed at home with head of bed greater than 45 degrees.

## 2021-08-23 NOTE — TOC Progression Note (Addendum)
Transition of Care [facility]) - Progression Note    Patient Details  Name: Kerry Matthews MRN: [id number] Date of Birth: 1936/08/23  [facility] CM/SW Contact  Tom-Johnson, Renea Ee, RN Phone Number: 08/23/2021, 1:14 PM  Clinical Narrative:    CM spoke with patient and wife at bedside about discharge needs. Patient states he would like to have a hospital bed and wheelchair for home use. Also asked about home oxygen. CM called and spoke with Thedore Mins with [employer]. Previous note states [employer] to deliver at discharge. CM notified MD of patient's request. Awaiting response to call in order. CM will continue to follow with needs. 15:20- Orders in for hospital bed and wheelchair. Spoke with Freda Munro with [employer], to deliver at home. Will continue to follow with needs.  Expected Discharge Plan: Home/Self Care Barriers to Discharge: Continued Medical Work up  Expected Discharge Plan and Services Expected Discharge Plan: Home/Self Care     Post Acute Care Choice: Durable Medical Equipment Living arrangements for the past 2 months: Single Family Home                 DME Arranged: Oxygen DME Agency: [employer] Date DME Agency Contacted: 08/19/21 Time DME Agency Contacted: 1622 Representative spoke with at DME Agency: [place] Interventions    Readmission Risk Interventions Readmission Risk Prevention Plan 07/09/2020  Post Dischage Appt Complete  Medication Screening Complete  Transportation Screening Complete  Some recent data might be hidden

## 2021-08-23 NOTE — Progress Notes (Signed)
Physical Therapy Treatment Patient Details Name: Kerry Matthews MRN: [id number] DOB: [birth year] Today's Date: 08/23/2021   History of Present Illness 85 y.o. M admitted 08/12/2021 with multifocal PNA. COVID-19 (+) 12/5. PMHx: ILD, pulmonary fibrosis, HTN, HLD, DM2, diabetic polyneuropathy,coronary artery disease, chronic LBP.    PT Comments    Pt continues to make good progress with mobility and activity tolerance. Was able to amb 60' with rolling walker on 6L O2 with SpO2 88% or greater. This distance should get pt from point to point in household. Wife mentioned concerned with stairs. Pt only has 2 stairs with rail into home and I do not anticipate pt will have any difficulty with these. Recommended to wife that she place a chair close to the entrance of the house do pt can sit and rest after he comes into the home.    Recommendations for follow up therapy are one component of a multi-disciplinary discharge planning process, led by the attending physician.  Recommendations may be updated based on patient status, additional functional criteria and insurance authorization.  Follow Up Recommendations  Home health PT     Assistance Recommended at Discharge Intermittent Supervision/Assistance  Equipment Recommendations  BSC/3in1    Recommendations for Other Services       Precautions / Restrictions Precautions Precautions: Other (comment) Precaution Comments: watch O2     Mobility  Bed Mobility Overal bed mobility: Modified Independent         Sit to supine: Modified independent (Device/Increase time)        Transfers Overall transfer level: Needs assistance Equipment used: Rolling walker (2 wheels) Transfers: Sit to/from Stand Sit to Stand: Supervision           General transfer comment: Verbal cues for hand placement    Ambulation/Gait Ambulation/Gait assistance: Supervision Gait Distance (Feet): 60 Feet Assistive device: Rolling walker (2 wheels) Gait  Pattern/deviations: Step-through pattern;Decreased stride length;Trunk flexed;Shuffle Gait velocity: decreased Gait velocity interpretation: <1.31 ft/sec, indicative of household ambulator   General Gait Details: Assist for safety and Heritage manager    Modified Rankin (Stroke Patients Only)       Balance Overall balance assessment: Mild deficits observed, not formally tested                                          Cognition Arousal/Alertness: Awake/alert Behavior During Therapy: WFL for tasks assessed/performed Overall Cognitive Status: Within Functional Limits for tasks assessed                                          Exercises      General Comments General comments (skin integrity, edema, etc.): Pt on 1L O2 at rest with SpO2 97%. Turned O2 up to 6L for amb. SpO2 >90% for first 73' of amb. After 60' of amb SpO2 88%. Sat and recovered to >90% after 1 minute.      Pertinent Vitals/Pain Pain Assessment: No/denies pain    Home Living                          Prior Function            PT Goals (current goals  can now be found in the care plan section) Acute Rehab PT Goals Patient Stated Goal: home Progress towards PT goals: Progressing toward goals    Frequency    Min 3X/week      PT Plan Current plan remains appropriate    Co-evaluation              AM-PAC PT "6 Clicks" Mobility   Outcome Measure  Help needed turning from your back to your side while in a flat bed without using bedrails?: None Help needed moving from lying on your back to sitting on the side of a flat bed without using bedrails?: None Help needed moving to and from a bed to a chair (including a wheelchair)?: A Little Help needed standing up from a chair using your arms (e.g., wheelchair or bedside chair)?: A Little Help needed to walk in hospital room?: A Little Help needed climbing 3-5 steps  with a railing? : A Little 6 Click Score: 20    End of Session Equipment Utilized During Treatment: Oxygen Activity Tolerance: Patient tolerated treatment well Patient left: in bed;with call bell/phone within reach;with family/visitor present;with nursing/sitter in room Nurse Communication: Mobility status PT Visit Diagnosis: Difficulty in walking, not elsewhere classified (R26.2);Other abnormalities of gait and mobility (R26.89)     Time: 7858-8502 PT Time Calculation (min) (ACUTE ONLY): 22 min  Charges:  $Gait Training: 8-22 mins                     [place] Pager [phone] Office [place] 08/23/2021, 4:14 PM

## 2021-08-24 DIAGNOSIS — N1831 Chronic kidney disease, stage 3a: Secondary | ICD-10-CM | POA: Diagnosis not present

## 2021-08-24 DIAGNOSIS — J189 Pneumonia, unspecified organism: Secondary | ICD-10-CM | POA: Diagnosis not present

## 2021-08-24 DIAGNOSIS — U071 COVID-19: Secondary | ICD-10-CM | POA: Diagnosis not present

## 2021-08-24 LAB — GLUCOSE, CAPILLARY
Glucose-Capillary: 121 mg/dL — ABNORMAL HIGH (ref 70–99)
Glucose-Capillary: 208 mg/dL — ABNORMAL HIGH (ref 70–99)
Glucose-Capillary: 293 mg/dL — ABNORMAL HIGH (ref 70–99)
Glucose-Capillary: 176 mg/dL — ABNORMAL HIGH (ref 70–99)

## 2021-08-24 LAB — C-REACTIVE PROTEIN: CRP: 3.3 mg/dL — ABNORMAL HIGH (ref <1.0)

## 2021-08-24 NOTE — TOC Progression Note (Signed)
Transition of Care [facility]) - Progression Note    Patient Details  Name: Skip Litke MRN: [id number] Date of Birth: 08-Jul-1936  [facility]) CM/SW Contact  Carles Collet, RN Phone Number: 08/24/2021, 12:51 PM  Clinical Narrative:    Damaris Schooner w patient's wife [name] over the phone. Confirmed need for DME hospital bed, WC and Oxygen with capacity up to 8L. [name] states that she won't be home until later but daughter Lovey Newcomer who lives a mile away is available to assist with delivery of DME at home today.  [name] states he will transport home via private car. Spoke w Delana Meyer at [place] who will coordinate delivery of DME w home concentrator with 10L capacity, hopefully today, if not in the AM. Discussed HH, no preference stated.  Referral made to [facility]- accepted.  Anticipate DC in the Am, patient will have O2 delivered to room for transportation home, other DME will be delivered to the house. HH is set up.     Expected Discharge Plan: [place] Barriers to Discharge: Continued Medical Work up  Expected Discharge Plan and Services Expected Discharge Plan: [place]   Discharge Planning Services: CM Consult Post Acute Care Choice: Durable Medical Equipment Living arrangements for the past 2 months: Single Family Home                 DME Arranged: Oxygen, Hospital bed, Lightweight manual wheelchair with seat cushion DME Agency: [employer] Date DME Agency Contacted: 08/24/21 Time DME Agency Contacted: [phone] Representative spoke with at DME Agency: [place]: RN, PT, OT [facility] Agency: [place] Date [place]: 08/24/21 Time [place]: 9480 Representative spoke with at [place]: [place] ([place]) Interventions    Readmission Risk Interventions Readmission Risk Prevention Plan 07/09/2020  Post Dischage Appt Complete  Medication Screening Complete  Transportation  Screening Complete  Some recent data might be hidden

## 2021-08-24 NOTE — Progress Notes (Signed)
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  Patient was treated with IV steroids/broad-spectrum antibiotics-Hospital course was complicated by worsening hypoxemia-after steroid dosage was de-escalated.  He underwent further evaluation-and was found to have not developed a antibody response despite COVID-vaccine x3.  ID/PCCM was consulted-patient steroid dosage was reescalated-and transfused 1 unit of COVID convalescent plasma.  See below for further details.  Significant events: 12/05>> COVID-19 positive on home testing 12/19>> admit for shortness of breath-COVID-19 PCR positive.  CT value of 28.6. 12/24>> worsening hypoxemia-started on salter high flow.  PCCM consulted. 12/24>> SARS-COV-2-Ab IgG nonreactive (s/p 3 vaccines, natural infection) 12/25>> remains on salter high flow- 10-15 L, ID consulted-convalescent plasma ordered.  CTA chest negative for PE   Subjective:  Patient denies any dyspnea at rest, he does report some dyspnea with activity, no complaints.   Objective: Vitals: Blood pressure 111/67, pulse 69, temperature 97.7 F (36.5 C), temperature source Oral, resp. rate 19, height 5\' 7"  (1.702 m), weight 85.7 kg, SpO2 99 %.   Physical exam:  Awake Alert, Oriented X 3, frail , chronically ill-appearing Symmetrical Chest wall movement, Good air movement bilaterally, CTAB RRR,No  Gallops,Rubs or new Murmurs, No Parasternal Heave +ve B.Sounds, Abd Soft, No tenderness, No rebound - guarding or rigidity. No Cyanosis, Clubbing or edema, No new Rash or bruise     Recent Labs    08/22/21 0059 08/23/21 0127 08/24/21 0055  DDIMER  --  2.13*  --   CRP 2.4* 4.4* 3.3*    Assessment & Plan: Acute hypoxic respiratory failure due to COVID-19 pneumonitis:  Has significantly improved with the past few days-at rest either on room air or on just 2 L-but requires around 8 L with activity/movement.  Has completed 1 week of IV cefepime.  Received convalescent plasma on 12/27.  No signs of volume overload.  Discussed with PCCM MD-Dr. Ellison-recommendations are to transition to oral prednisone today-60 mg daily and to decrease by 10 mg every 6 days.  Spouse/daughter at bedside and very anxious about potential discharge plans-have provided extensive reassurance-anticipate that patient will require significant small amount of oxygen with ambulation.  Also expect that he will continue to desaturate somewhat with movement-but over the past several days-has quickly rebounded with rest-and we have been able to titrate him down to just 1-2 L of oxygen in a few minutes.  PCCM will arrange for outpatient follow-up with his primary pulmonologist, will watch him for another day or so and see how he does-if he remains stable and improved-likely discharge this coming Sunday with home oxygen. -Transition to oral prednisone, to be started today, to continue with prolonged taper, he will need 1 to 2 L oxygen at rest, but will need between 6-8 with activity, this will be arranged on discharge.  History of ILD/microscopic polyangiitis: On Rituxan every 6 months (last dose June 2022)-we will schedule to get a injection earlier this month-however has now been postponed due to COVID-19  diagnoses.  See above.  Elevated D-dimer: Likely due to COVID-19 infection associated inflammation-lower extremity Doppler on  12/24 negative for DVT-CTA chest on 12/25 negative for VTE.  On prophylactic heparin.  Leukocytosis: Likely due to steroids-cultures negative so far-continue to monitor.  CKD stage IIIa: Creatinine at baseline-watch closely.   Hyponatremia: Resolved.  Monitor periodically.  CAD: No anginal symptoms-continue aspirin/statin.  HLD: Continue statin  DM-2 (A1c 6.3 on 12/14) with uncontrolled hyperglycemia due to steroids: CBGs appear stable-continue Semglee 22 units daily, 8 units of NovoLog with meals and SSI.       Recent Labs    08/23/21 2214 08/24/21 0747 08/24/21 1203  GLUCAP 188* 121* 208*    Peripheral neuropathy: Likely due to DM-continue Neurontin  HLD: Continue statin  GERD: Continue PPI  DVT prophylaxis: Lovenox  Code Status: 12/27>> patient wants to switch from DNR to DNI Family Communication: Spouse at bedside. Disposition:   Status is: Inpatient  Remains inpatient appropriate because: IV ABX and steroids-if clinical improvement continues-Home in the next few days.    Consultants:  PCCM  Data Reviewed: I have personally reviewed following labs and imaging studies  CBC: Recent Labs  Lab 08/18/21 0119 08/19/21 0148 08/20/21 0122 08/22/21 0059  WBC 12.0* 17.2* 15.8* 18.5*  HGB 11.7* 11.9* 11.6* 11.9*  HCT 34.8* 34.9* 33.3* 34.0*  MCV 84.3 85.7 88.8 89.2  PLT 320 383 371 644    Basic Metabolic Panel: Recent Labs  Lab 08/18/21 0119 08/19/21 0148 08/19/21 0435 08/20/21 0122 08/22/21 0059 08/23/21 0127  NA 135 135  --  136 138 135  K 4.3 4.8  --  4.8 4.9 5.0  CL 105 104  --  107 107 106  CO2 22 21*  --  22 25 24   GLUCOSE 185* 171*  --  104* 97 165*  BUN 39* 42*  --  34* 35* 33*  CREATININE 1.32* 1.22  --  1.20 1.18 1.06  CALCIUM 7.8* 7.7*  --  7.8* 8.1* 7.7*  MG  --   --  2.6*  --   --   --     GFR: Estimated Creatinine Clearance: 55.2 mL/min (by C-G formula based on SCr of 1.06 mg/dL).  Liver Function Tests: Recent Labs  Lab  08/18/21 0119 08/19/21 0148 08/20/21 0122  AST 43* 55* 58*  ALT 82* 81* 91*  ALKPHOS 146* 127* 144*  BILITOT 1.3* 1.1 1.0  PROT 4.9* 4.7* 4.8*  ALBUMIN 2.3* 2.1* 2.2*    CBG: Recent Labs  Lab 08/23/21 1129 08/23/21 1608 08/23/21 2214 08/24/21 0747 08/24/21 1203  GLUCAP 149* 302* 188* 121* 208*     No results found for this or any previous visit (from the past 240 hour(s)).         Radiology Studies: No results found.      Scheduled Meds:  acetaminophen  650 mg Oral Once   vitamin C  500 mg Oral Daily   aspirin EC  81 mg Oral Daily   benzonatate  200 mg Oral TID   enoxaparin (LOVENOX) injection  40 mg Subcutaneous Q24H   gabapentin  400 mg Oral BID   guaiFENesin  600 mg Oral BID   insulin aspart  0-15 Units Subcutaneous TID AC & HS   insulin aspart  8 Units Subcutaneous TID WC   insulin glargine-yfgn  22 Units Subcutaneous Daily   pantoprazole  40 mg Oral Q1200   predniSONE  60 mg Oral Q breakfast   rosuvastatin  20 mg  Oral QHS   zinc sulfate  220 mg Oral Daily   Continuous Infusions:     LOS: 12 days       Phillips Climes, MD [facility]   To contact the attending provider between 7A-7P or the covering provider during after hours 7P-7A, please log into the web site [email or web address] and access using universal [place] password for that web site. If you do not have the password, please call the hospital operator.  08/24/2021, 2:08 PM   Patient ID: Kerry Matthews, male   DOB: 03/18/1936, 85 y.o.   MRN: [id number]

## 2021-08-25 DIAGNOSIS — I2510 Atherosclerotic heart disease of native coronary artery without angina pectoris: Secondary | ICD-10-CM | POA: Diagnosis not present

## 2021-08-25 DIAGNOSIS — U071 COVID-19: Secondary | ICD-10-CM | POA: Diagnosis not present

## 2021-08-25 DIAGNOSIS — J189 Pneumonia, unspecified organism: Secondary | ICD-10-CM | POA: Diagnosis not present

## 2021-08-25 DIAGNOSIS — E1142 Type 2 diabetes mellitus with diabetic polyneuropathy: Secondary | ICD-10-CM | POA: Diagnosis not present

## 2021-08-25 LAB — C-REACTIVE PROTEIN: CRP: 1.7 mg/dL — ABNORMAL HIGH (ref <1.0)

## 2021-08-25 LAB — GLUCOSE, CAPILLARY
Glucose-Capillary: 115 mg/dL — ABNORMAL HIGH (ref 70–99)
Glucose-Capillary: 156 mg/dL — ABNORMAL HIGH (ref 70–99)

## 2021-08-25 MED ORDER — INSULIN PEN NEEDLE 29G X 5MM MISC
0 refills | Status: DC
Start: 2021-08-25 — End: 2021-10-14

## 2021-08-25 MED ORDER — INSULIN DETEMIR 100 UNIT/ML FLEXPEN: 22.0000 [IU] | PEN_INJECTOR | Freq: Every day | SUBCUTANEOUS | 1 refills | Status: DC

## 2021-08-25 MED ORDER — INSULIN DETEMIR 100 UNIT/ML FLEXPEN: 8.0000 [IU] | PEN_INJECTOR | Freq: Every day | SUBCUTANEOUS | 1 refills | Status: DC

## 2021-08-25 MED ORDER — HYDROCOD POLST-CPM POLST ER 10-8 MG/5ML PO SUER: 2.5000 mL | Freq: Every evening | ORAL | 0 refills | Status: DC | PRN

## 2021-08-25 MED ORDER — PREDNISONE 10 MG PO TABS: ORAL_TABLET | ORAL | 0 refills | Status: AC

## 2021-08-25 MED ORDER — HYDROCODONE BIT-HOMATROP MBR 5-1.5 MG/5ML PO SOLN: 5.0000 mL | Freq: Two times a day (BID) | ORAL | 0 refills | Status: DC | PRN

## 2021-08-25 NOTE — Progress Notes (Signed)
Nsg Discharge Note  Admit Date:  08/12/2021 Discharge date: 08/25/2021   Kerry Matthews to be D/C'd Home per MD order.  AVS completed.     Discharge Medication: Allergies as of 08/25/2021       Reactions   Ace Inhibitors Other (See Comments)   = Slow heart rate   Hydrocodone Nausea And Vomiting   Hydrocodone-acetaminophen Nausea And Vomiting   Nsaids Other (See Comments)   Kidney issues   Sulfamethoxazole Nausea And Vomiting   Sulfamethoxazole-trimethoprim Nausea And Vomiting   Trimethoprim Other (See Comments)   Reaction not recalled        Medication List     STOP taking these medications    azithromycin 250 MG tablet Commonly known as: ZITHROMAX   cefdinir 300 MG capsule Commonly known as: OMNICEF   Rituxan 100 MG/10ML injection Generic drug: riTUXimab       TAKE these medications    aspirin EC 81 MG tablet Take 1 tablet (81 mg total) by mouth 2 (two) times daily. To be taken after surgery What changed:  when to take this additional instructions   Coenzyme Q10 100 MG Tabs Take 100 mg by mouth at bedtime. Notes to patient: Did not receive in hospital; resume as directed.   docusate sodium 100 MG capsule Commonly known as: COLACE Take 100 mg by mouth 2 (two) times daily as needed for mild constipation. Notes to patient: Did not receive in hospital; resume as directed.   Fish Oil 1000 MG Caps Take 2,000 mg by mouth daily. Notes to patient: Did not receive in hospital; resume as directed.   furosemide 20 MG tablet Commonly known as: LASIX Take 20 mg by mouth daily as needed for fluid.   gabapentin 400 MG capsule Commonly known as: NEURONTIN Take 400 mg by mouth 2 (two) times daily.   HYDROcodone bit-homatropine 5-1.5 MG/5ML syrup Commonly known as: Hycodan Take 5 mLs by mouth every 12 (twelve) hours as needed for cough.   insulin detemir 100 UNIT/ML FlexPen Commonly known as: LEVEMIR Inject 22 Units into the skin daily.   Insulin Pen Needle  29G X 5MM Misc Use with insulin pen   nitroGLYCERIN 0.4 MG SL tablet Commonly known as: NITROSTAT Place 1 tablet (0.4 mg total) under the tongue every 5 (five) minutes as needed for chest pain. Notes to patient: Did not receive in hospital; resume as directed.   omeprazole 20 MG capsule Commonly known as: PRILOSEC Take 1 capsule (20 mg total) by mouth daily. What changed: when to take this Notes to patient: Did not receive in hospital; resume as directed.   predniSONE 10 MG tablet Commonly known as: DELTASONE Take 6 tablets (60 mg total) by mouth daily with breakfast for 6 days, THEN 5 tablets (50 mg total) daily with breakfast for 6 days, THEN 4 tablets (40 mg total) daily with breakfast for 6 days, THEN 3 tablets (30 mg total) daily with breakfast for 6 days, THEN 2 tablets (20 mg total) daily with breakfast for 6 days, THEN 1 tablet (10 mg total) daily with breakfast for 6 days. Start taking on: August 25, 2021   PreserVision AREDS 2 Caps Take 1 capsule by mouth 2 (two) times daily after a meal. Notes to patient: Did not receive in hospital; resume as directed.   rosuvastatin 20 MG tablet Commonly known as: Crestor Take 1 tablet (20 mg total) by mouth daily. What changed: when to take this   True Metrix Blood Glucose Test test strip Generic  drug: glucose blood E11.69 TEST BLOOD SUGAR THREE TIMES DAILY BEFORE MEALS Notes to patient: Did not receive in hospital; resume as directed.   vitamin B-12 1000 MCG tablet Commonly known as: CYANOCOBALAMIN Take 1 tablet (1,000 mcg total) by mouth daily. What changed: when to take this Notes to patient: Did not receive in hospital; resume as directed.               Durable Medical Equipment  (From admission, onward)           Start     Ordered   08/23/21 1625  For home use only DME Hospital bed  Once       Question Answer Comment  Length of Need 6 Months   Patient has (list medical condition): Severe hypoxia/physical  deconditioning from COVID-19 pneumonia.   The above medical condition requires: Patient requires the ability to reposition frequently   Head must be elevated greater than: 45 degrees   Bed type Semi-electric      08/23/21 1627   08/23/21 1433  For home use only DME standard manual wheelchair with seat cushion  Once       Comments: Patient suffers from physical deconditioning which impairs their ability to perform daily activities like bathing, dressing, feeding, grooming, and toileting in the home.  A cane, crutch, or walker will not resolve issue with performing activities of daily living. A wheelchair will allow patient to safely perform daily activities. Patient can safely propel the wheelchair in the home or has a caregiver who can provide assistance. Length of need 6 months . Accessories: elevating leg rests (ELRs), wheel locks, extensions and anti-tippers.   08/23/21 1432   08/23/21 1432  For home use only DME oxygen  Once       Comments: 1-2 L at rest 8 L with ambulation.  Question Answer Comment  Length of Need 6 Months   Mode or (Route) Nasal cannula   Liters per Minute 2   Frequency Continuous (stationary and portable oxygen unit needed)   Oxygen conserving device Yes   Oxygen delivery system Gas      08/23/21 1432            Discharge Assessment: Vitals:   08/25/21 0752 08/25/21 1141  BP: 121/60 (!) 110/59  Pulse: 75 66  Resp: 19 14  Temp: (!) 97.5 F (36.4 C) 97.6 F (36.4 C)  SpO2: (!) 85% 98%   Skin clean, dry and intact without evidence of skin break down, no evidence of skin tears noted. IV catheter discontinued intact. Site without signs and symptoms of complications - no redness or edema noted at insertion site, patient denies c/o pain - only slight tenderness at site.  Dressing with slight pressure applied.  D/c Instructions-Education: Discharge instructions given to patient/family with verbalized understanding. D/c education completed with  patient/family including follow up instructions, medication list, d/c activities limitations if indicated, with other d/c instructions as indicated by MD - patient able to verbalize understanding, all questions fully answered. Patient instructed to return to ED, call 911, or call MD for any changes in condition.  Patient escorted via [place], and D/C home via private auto.  Hiram Comber, RN 08/25/2021 2:05 PM

## 2021-08-25 NOTE — TOC Transition Note (Signed)
Transition of Care [facility]) - CM/SW Discharge Note   Patient Details  Name: Kerry Matthews MRN: [id number] Date of Birth: [birth year]  [facility]) CM/SW Contact:  Carles Collet, RN Phone Number: 08/25/2021, 10:27 AM   Clinical Narrative:    Damaris Schooner w patient's daughter who confirmed that DME bed and O2 were delivered to the house, they will get [facility] from friend. Tanks for transportation home are in room per daughter and they still plan on taking home by private car.  [place] notified of DC today.       Barriers to Discharge: Continued Medical Work up   Patient Goals and CMS Choice Patient states their goals for this hospitalization and ongoing recovery are:: return home CMS Medicare.gov Compare Post Acute Care list provided to:: Other (Comment Required) Choice offered to / list presented to : Spouse  Discharge Placement                       Discharge Plan and Services   Discharge Planning Services: CM Consult Post Acute Care Choice: Durable Medical Equipment          DME Arranged: Oxygen, Hospital bed, Lightweight manual wheelchair with seat cushion DME Agency: [employer] Date DME Agency Contacted: 08/24/21 Time DME Agency Contacted: [phone] Representative spoke with at DME Agency: [place]: RN, PT, OT [facility] Agency: [place] Date [place]: 08/24/21 Time [place]: 3903 Representative spoke with at [place]: [place] ([place]) Interventions     Readmission Risk Interventions Readmission Risk Prevention Plan 07/09/2020  Post Dischage Appt Complete  Medication Screening Complete  Transportation Screening Complete  Some recent data might be hidden

## 2021-08-25 NOTE — Discharge Summary (Signed)
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  Patient was treated with IV steroids/broad-spectrum antibiotics-Hospital course was complicated by worsening hypoxemia-after steroid dosage was de-escalated.  He underwent further evaluation-and was found to have not developed a antibody response despite COVID-vaccine x3.  ID/PCCM was consulted-patient steroid dosage was reescalated-and transfused 1 unit of COVID convalescent plasma.  See below for further details.   Significant events: 12/05>> COVID-19 positive on home testing 12/19>> admit for shortness of breath-COVID-19 PCR positive.  CT value of 28.6. 12/24>> worsening hypoxemia-started on salter high flow.  PCCM consulted. 12/24>> SARS-COV-2-Ab IgG nonreactive (s/p 3 vaccines, natural infection) 12/25>> remains on salter high flow- 10-15 L, ID consulted-convalescent plasma ordered.  CTA chest negative for PE, respiratory status started to significantly improved after convulsant plasma, currently on 2 L at rest, 6-8 with activity.    Acute hypoxic  respiratory failure due to COVID-19 pneumonitis:  Has significantly improved with the past few days-at rest either on room air or on just 2 L-but requires around 8 L with activity/movement.  Has completed 1 week of IV cefepime.  Received convalescent plasma on 12/27.  No signs of volume overload. Previous MD  discussed with PCCM MD-Dr. Ellison-recommendations are to transition to oral prednisone today-60 mg daily and to decrease by 10 mg every 6 days.   PCCM will arrange for outpatient follow-up with his primary pulmonologist. -Patient is on Rituxan, immunocompromise, this is likely hindered him building good immune response to the vaccines this is why he became very sensitive and susceptible to COVID infections, he was found not to have developed any antibody response despite COVID-vaccine x3. -Podiatry status significantly improved, but remains tenuous, he is requiring 1 to 2 L nasal While at rest, but with activity he will need increased oxygen requirement up to 6 to 8 L which we see usually with COVID-19 patients, I have discussed with patient/family, to increase his oxygen supply before activity and patient was encouraged to breathe through his nose/nasal cannula with activity. -He will be discharged on prednisone taper as recommended per PCCM.    History of ILD/microscopic polyangiitis: On Rituxan every 6 months (last dose June 2022)-he was scheduled to get a injection earlier in December -however has now been postponed due to COVID-19 diagnoses.  See above.   Elevated D-dimer: Likely due to COVID-19 infection associated inflammation-lower extremity Doppler on 12/24 negative for DVT-CTA chest on 12/25 negative for VTE.  On prophylactic heparin.   Leukocytosis: Likely due to steroids-cultures negative so far-continue to monitor.   CKD stage IIIa: Creatinine at baseline-watch closely.   Hyponatremia: Resolved.  Monitor periodically.  CAD: No anginal symptoms-continue aspirin/statin.   HLD:  Continue statin   DM-2 (A1c 6.3 on 12/14) with uncontrolled hyperglycemia  due to steroids: -Given the fact he will need prolonged steroid taper he will be discharged on Levemir 22 units.        Recent Labs (last 2 labs)        Recent Labs    08/23/21 2214 08/24/21 0747 08/24/21 1203  GLUCAP 188* 121* 208*      Peripheral neuropathy: Likely due to DM-continue Neurontin   HLD: Continue statin   GERD: Continue PPI  Discharge Diagnoses:  Principal Problem:   Pneumonia of both lungs due to infectious organism Active Problems:   Coronary artery disease involving native coronary artery of native heart without angina pectoris   GERD without esophagitis   Type 2 diabetes mellitus with diabetic polyneuropathy, without long-term current use of insulin (HCC)   Respiratory disorder concurrent with and due to microscopic polyangiitis (HCC)   Hyponatremia   Mixed diabetic hyperlipidemia associated with type 2 diabetes mellitus ([place])   Diabetic polyneuropathy associated with type 2 diabetes mellitus ([place])   Chronic kidney disease, stage 3a ([place])   COVID-19 virus infection    Discharge Instructions  Discharge Instructions     Discharge instructions   Complete by: As directed    Follow with Primary MD Cox, Kirsten, MD in 10 days   Get CBC, CMP,  checked  by Primary MD next visit.    Activity: As tolerated with Full fall precautions use walker/cane & assistance as needed   Disposition Home   Diet: Heart Healthy / carb modified, with feeding assistance and aspiration precautions.   On your next visit with your primary care physician please Get Medicines reviewed and adjusted.   Please request your Prim.MD to go over all Hospital Tests and Procedure/Radiological results at the follow up, please get all Hospital records sent to your Prim MD by signing hospital release before you go home.   If you experience worsening of your admission symptoms, develop shortness of breath,  life threatening emergency, suicidal or homicidal thoughts you must seek medical attention immediately by calling 911 or calling your MD immediately  if symptoms less severe.  You Must read complete instructions/literature along with all the possible adverse reactions/side effects for all the Medicines you take and that have been prescribed to you. Take any new Medicines after you have completely understood and accpet all the possible adverse reactions/side effects.   Do not drive, operating heavy machinery, perform activities at heights, swimming or participation in water activities or provide baby sitting services if your were admitted for syncope or siezures until you have seen by Primary MD or a Neurologist and advised to do so again.  Do not drive when taking Pain medications.    Do not take more than prescribed Pain, Sleep and Anxiety Medications  Special Instructions: If you have smoked or chewed Tobacco  in the last 2 yrs please stop smoking, stop any regular Alcohol  and or any Recreational drug use.  Wear Seat belts while driving.   Please note  You were cared for by a hospitalist during your hospital stay. If you have any questions about your discharge medications or the care you received while you were in the hospital after you are discharged, you can call the unit and asked to speak with the hospitalist on call if the hospitalist that took care of you is not available. Once you are discharged, your primary care physician will handle any further medical issues. Please note that NO REFILLS for any discharge medications will be authorized once you are  discharged, as it is imperative that you return to your primary care physician (or establish a relationship with a primary care physician if you do not have one) for your aftercare needs so that they can reassess your need for medications and monitor your lab values.   Increase activity slowly   Complete by: As directed       Allergies  as of 08/25/2021       Reactions   Ace Inhibitors Other (See Comments)   = Slow heart rate   Hydrocodone Nausea And Vomiting   Hydrocodone-acetaminophen Nausea And Vomiting   Nsaids Other (See Comments)   Kidney issues   Sulfamethoxazole Nausea And Vomiting   Sulfamethoxazole-trimethoprim Nausea And Vomiting   Trimethoprim Other (See Comments)   Reaction not recalled        Medication List     STOP taking these medications    azithromycin 250 MG tablet Commonly known as: ZITHROMAX   cefdinir 300 MG capsule Commonly known as: OMNICEF   Rituxan 100 MG/10ML injection Generic drug: riTUXimab       TAKE these medications    aspirin EC 81 MG tablet Take 1 tablet (81 mg total) by mouth 2 (two) times daily. To be taken after surgery What changed:  when to take this additional instructions   chlorpheniramine-HYDROcodone 10-8 MG/5ML Suer Commonly known as: TUSSIONEX Take 2.5 mLs by mouth at bedtime as needed for cough.   Coenzyme Q10 100 MG Tabs Take 100 mg by mouth at bedtime.   docusate sodium 100 MG capsule Commonly known as: COLACE Take 100 mg by mouth 2 (two) times daily as needed for mild constipation.   Fish Oil 1000 MG Caps Take 2,000 mg by mouth daily.   furosemide 20 MG tablet Commonly known as: LASIX Take 20 mg by mouth daily as needed for fluid.   gabapentin 400 MG capsule Commonly known as: NEURONTIN Take 400 mg by mouth 2 (two) times daily.   insulin detemir 100 UNIT/ML FlexPen Commonly known as: LEVEMIR Inject 8 Units into the skin daily.   Insulin Pen Needle 29G X 5MM Misc Use with insulin pen   nitroGLYCERIN 0.4 MG SL tablet Commonly known as: NITROSTAT Place 1 tablet (0.4 mg total) under the tongue every 5 (five) minutes as needed for chest pain.   omeprazole 20 MG capsule Commonly known as: PRILOSEC Take 1 capsule (20 mg total) by mouth daily. What changed: when to take this   predniSONE 10 MG tablet Commonly known as:  DELTASONE Take 6 tablets (60 mg total) by mouth daily with breakfast for 6 days, THEN 5 tablets (50 mg total) daily with breakfast for 6 days, THEN 4 tablets (40 mg total) daily with breakfast for 6 days, THEN 3 tablets (30 mg total) daily with breakfast for 6 days, THEN 2 tablets (20 mg total) daily with breakfast for 6 days, THEN 1 tablet (10 mg total) daily with breakfast for 6 days. Start taking on: August 25, 2021   PreserVision AREDS 2 Caps Take 1 capsule by mouth 2 (two) times daily after a meal.   rosuvastatin 20 MG tablet Commonly known as: Crestor Take 1 tablet (20 mg total) by mouth daily. What changed: when to take this   True Metrix Blood Glucose Test test strip Generic drug: glucose blood E11.69 TEST BLOOD SUGAR THREE TIMES DAILY BEFORE MEALS   vitamin B-12 1000 MCG tablet Commonly known as: CYANOCOBALAMIN Take 1 tablet (1,000 mcg total) by mouth daily. What changed: when to  take this               Durable Medical Equipment  (From admission, onward)           Start     Ordered   08/23/21 1625  For home use only DME Hospital bed  Once       Question Answer Comment  Length of Need 6 Months   Patient has (list medical condition): Severe hypoxia/physical deconditioning from COVID-19 pneumonia.   The above medical condition requires: Patient requires the ability to reposition frequently   Head must be elevated greater than: 45 degrees   Bed type Semi-electric      08/23/21 1627   08/23/21 1433  For home use only DME standard manual wheelchair with seat cushion  Once       Comments: Patient suffers from physical deconditioning which impairs their ability to perform daily activities like bathing, dressing, feeding, grooming, and toileting in the home.  A cane, crutch, or walker will not resolve issue with performing activities of daily living. A wheelchair will allow patient to safely perform daily activities. Patient can safely propel the wheelchair in the home  or has a caregiver who can provide assistance. Length of need 6 months . Accessories: elevating leg rests (ELRs), wheel locks, extensions and anti-tippers.   08/23/21 1432   08/23/21 1432  For home use only DME oxygen  Once       Comments: 1-2 L at rest 8 L with ambulation.  Question Answer Comment  Length of Need 6 Months   Mode or (Route) Nasal cannula   Liters per Minute 2   Frequency Continuous (stationary and portable oxygen unit needed)   Oxygen conserving device Yes   Oxygen delivery system Gas      08/23/21 1432            Follow-up Information     [facility]   Specialty: Home Health Services Why: For home health servies PT OT RN. They will call you in 1-2 days to set up your first home appointment Contact information: [address] [place] [address] [phone]                Allergies  Allergen Reactions   Ace Inhibitors Other (See Comments)    = Slow heart rate   Hydrocodone Nausea And Vomiting   Hydrocodone-Acetaminophen Nausea And Vomiting   Nsaids Other (See Comments)    Kidney issues   Sulfamethoxazole Nausea And Vomiting   Sulfamethoxazole-Trimethoprim Nausea And Vomiting   Trimethoprim Other (See Comments)    Reaction not recalled    Consultations: PCCM   Procedures/Studies: DG Chest 2 View  Result Date: 08/12/2021 CLINICAL DATA:  Shortness of breath EXAM: CHEST - 2 VIEW COMPARISON:  Chest x-ray 08/09/2021 FINDINGS: Heart size is upper normal. Mediastinum appears stable. Persistent diffuse chronic prominent reticular interstitial opacities with patchy hazy superimposed opacities bilaterally. No significant pleural effusion. No pneumothorax. IMPRESSION: No significant change since previous study. COPD with patchy hazy opacities which could represent superimposed infiltrate. Electronically Signed   By: Ofilia Neas M.D.   On: 08/12/2021 11:34   CT Angio Chest Pulmonary Embolism (PE) W or WO  Contrast  Result Date: 08/18/2021 CLINICAL DATA:  PE suspected, positive D-dimer EXAM: CT ANGIOGRAPHY CHEST WITH CONTRAST TECHNIQUE: Multidetector CT imaging of the chest was performed using the standard protocol during bolus administration of intravenous contrast. Multiplanar CT image reconstructions and MIPs were obtained to  evaluate the vascular anatomy. CONTRAST:  25mL OMNIPAQUE IOHEXOL 350 MG/ML SOLN COMPARISON:  08/12/2021, 10/14/2019 FINDINGS: Cardiovascular: Satisfactory opacification of the pulmonary arteries to the segmental level. No evidence of pulmonary embolism. Cardiomegaly. Three-vessel coronary artery calcifications. Enlargement of the main pulmonary artery, measuring up to 4.0 cm in caliber. No pericardial effusion. Mediastinum/Nodes: Unchanged prominent mediastinal lymph nodes thyroid gland, trachea, and esophagus demonstrate no significant findings. Lungs/Pleura: There is extensive, somewhat geographic bilateral heterogeneous and ground-glass airspace opacity with superimposed interlobular septal thickening. Fibrotic change and subpleural bronchiolectasis at the dependent bilateral lung bases (series 7, image 62). Trace bilateral pleural effusions. Upper Abdomen: No acute abnormality. Musculoskeletal: No chest wall abnormality. No acute or significant osseous findings. Review of the MIP images confirms the above findings. IMPRESSION: 1. Negative examination for pulmonary embolism. 2. Extensive, somewhat geographic bilateral heterogeneous and ground-glass airspace opacity with superimposed interlobular septal thickening, somewhat worsened compared to prior examination. Trace bilateral pleural effusions. Findings are most consistent with multifocal infection, including COVID-19 if clinically suspected. 3. Fibrotic change and subpleural bronchiolectasis at the dependent bilateral lung bases, in keeping with UIP pattern of fibrosis seen on prior CT dated 10/14/2019 although otherwise not well  characterized in the setting of acute airspace disease. 4. Cardiomegaly and coronary artery disease. 5. Enlargement of the main pulmonary artery, as can be seen in pulmonary hypertension. Electronically Signed   By: Delanna Ahmadi M.D.   On: 08/18/2021 12:58   CT Angio Chest PE W and/or Wo Contrast  Result Date: 08/12/2021 CLINICAL DATA:  Shortness of breath. EXAM: CT ANGIOGRAPHY CHEST WITH CONTRAST TECHNIQUE: Multidetector CT imaging of the chest was performed using the standard protocol during bolus administration of intravenous contrast. Multiplanar CT image reconstructions and MIPs were obtained to evaluate the vascular anatomy. CONTRAST:  28mL OMNIPAQUE IOHEXOL 350 MG/ML SOLN COMPARISON:  October 14, 2019. FINDINGS: Cardiovascular: Satisfactory opacification of the pulmonary arteries to the segmental level. No evidence of pulmonary embolism. Mild cardiomegaly. No pericardial effusion. Atherosclerosis of thoracic aorta is noted without dissection. Mild coronary artery calcifications are noted. Mediastinum/Nodes: No enlarged mediastinal, hilar, or axillary lymph nodes. Thyroid gland, trachea, and esophagus demonstrate no significant findings. Lungs/Pleura: No pneumothorax or pleural effusion is noted. There is interval development of multiple patchy airspace opacities in both lungs concerning for multifocal pneumonia. Upper Abdomen: No acute abnormality. Musculoskeletal: No chest wall abnormality. No acute or significant osseous findings. Review of the MIP images confirms the above findings. IMPRESSION: No definite evidence of pulmonary embolus. Interval development of multiple patchy airspace opacities in both lungs concerning for multifocal pneumonia. Mild coronary calcifications are noted. Aortic Atherosclerosis (ICD10-I70.0). Electronically Signed   By: Marijo Conception M.D.   On: 08/12/2021 18:30   DG Chest Port 1V same Day  Result Date: 08/17/2021 CLINICAL DATA:  Pneumonia EXAM: PORTABLE CHEST 1  VIEW COMPARISON:  08/16/2021 FINDINGS: Patchy bilateral airspace disease again noted, similar to prior study. Heart is borderline in size. No effusions. No acute bony abnormality. IMPRESSION: Patchy bilateral airspace disease compatible with pneumonia. No real change. Electronically Signed   By: Rolm Baptise M.D.   On: 08/17/2021 10:19   DG Chest Port 1V same Day  Result Date: 08/16/2021 CLINICAL DATA:  Short of breath EXAM: PORTABLE CHEST 1 VIEW COMPARISON:  Chest x-ray 08/12/2021.  Chest CT 08/12/2021 FINDINGS: Patchy bilateral infiltrates with mild progression. No pleural effusion Heart size enlarged.  No vascular congestion. IMPRESSION: Progression of patchy bilateral infiltrates compatible with pneumonia. Correlate with COVID status. Electronically Signed  By: Franchot Gallo M.D.   On: 08/16/2021 14:51   VAS [place] LOWER EXTREMITY VENOUS (DVT)  Result Date: 08/18/2021  Lower Venous DVT Study Patient Name:  [facility]  Date of Exam:   08/17/2021 Medical Rec #: [id number]       Accession #:    [id number] Date of Birth: Mar 22, 1936       Patient Gender: M Patient Age:   33 years Exam Location:  [facility] Procedure:      VAS [place] LOWER EXTREMITY VENOUS (DVT) Referring Phys: Oren Binet --------------------------------------------------------------------------------  Indications: Hypoxia.  Risk Factors: COVID 19 positive. Limitations: Poor ultrasound/tissue interface and patient positioning. Comparison Study: No prior studies. Performing Technologist: Oliver Hum RVT  Examination Guidelines: A complete evaluation includes B-mode imaging, spectral Doppler, color Doppler, and power Doppler as needed of all accessible portions of each vessel. Bilateral testing is considered an integral part of a complete examination. Limited examinations for reoccurring indications may be performed as noted. The reflux portion of the exam is performed with the patient in reverse Trendelenburg.   +---------+---------------+---------+-----------+----------+--------------+  RIGHT     Compressibility Phasicity Spontaneity Properties Thrombus Aging  +---------+---------------+---------+-----------+----------+--------------+  CFV       Full            Yes       Yes                                    +---------+---------------+---------+-----------+----------+--------------+  SFJ       Full                                                             +---------+---------------+---------+-----------+----------+--------------+  FV Prox   Full                                                             +---------+---------------+---------+-----------+----------+--------------+  FV Mid    Full                                                             +---------+---------------+---------+-----------+----------+--------------+  FV Distal Full                                                             +---------+---------------+---------+-----------+----------+--------------+  PFV       Full                                                             +---------+---------------+---------+-----------+----------+--------------+  POP       Full            Yes       Yes                                    +---------+---------------+---------+-----------+----------+--------------+  PTV       Full                                                             +---------+---------------+---------+-----------+----------+--------------+  PERO      Full                                                             +---------+---------------+---------+-----------+----------+--------------+   +---------+---------------+---------+-----------+----------+--------------+  LEFT      Compressibility Phasicity Spontaneity Properties Thrombus Aging  +---------+---------------+---------+-----------+----------+--------------+  CFV       Full            Yes       Yes                                     +---------+---------------+---------+-----------+----------+--------------+  SFJ       Full                                                             +---------+---------------+---------+-----------+----------+--------------+  FV Prox   Full                                                             +---------+---------------+---------+-----------+----------+--------------+  FV Mid    Full                                                             +---------+---------------+---------+-----------+----------+--------------+  FV Distal Full                                                             +---------+---------------+---------+-----------+----------+--------------+  PFV       Full                                                             +---------+---------------+---------+-----------+----------+--------------+  POP       Full            Yes       Yes                                    +---------+---------------+---------+-----------+----------+--------------+  PTV       Full                                                             +---------+---------------+---------+-----------+----------+--------------+  PERO      Full                                                             +---------+---------------+---------+-----------+----------+--------------+     Summary: RIGHT: - There is no evidence of deep vein thrombosis in the lower extremity.  - No cystic structure found in the popliteal fossa.  LEFT: - There is no evidence of deep vein thrombosis in the lower extremity.  - No cystic structure found in the popliteal fossa.  *See table(s) above for measurements and observations. Electronically signed by Jamelle Haring on 08/18/2021 at 3:29:14 PM.    Final    ECHOCARDIOGRAM LIMITED  Result Date: 08/18/2021    ECHOCARDIOGRAM LIMITED REPORT   Patient Name:   Jameek [facility] Date of Exam: 08/18/2021 Medical Rec #:  [id number]      Height:       67.0 in Accession #:    [id number]     Weight:        189.0 lb Date of Birth:  [birth year]      BSA:          1.974 m Patient Age:    55 years       BP:           120/61 mmHg Patient Gender: M              HR:           83 bpm. Exam Location:  Inpatient Procedure: Limited Echo, Cardiac Doppler and Color Doppler Indications:    Dyspnea  History:        Patient has prior history of Echocardiogram examinations, most                 recent 04/04/2019. CAD; Risk Factors:Hypertension and                 Dyslipidemia. CKD. COVID-19.  Sonographer:    Clayton Lefort RDCS (AE) Referring Phys: [place] Comments: No subcostal window. COVID-19. IMPRESSIONS  1. Left ventricular ejection fraction, by estimation, is 65 to 70%. The left ventricle has normal function. The left ventricle has no regional wall motion abnormalities. There is severe concentric left ventricular hypertrophy. Left ventricular diastolic  parameters are indeterminate.  2. Right ventricular systolic function is normal. The right ventricular size is normal. Tricuspid regurgitation signal is inadequate for assessing PA pressure.  3. The mitral valve is grossly normal. Mild mitral valve regurgitation.  4. The aortic valve  is tricuspid. There is mild calcification of the aortic valve. Aortic valve regurgitation is mild. Aortic valve sclerosis/calcification is present, without any evidence of aortic stenosis. Aortic valve mean gradient measures 3.0 mmHg.  5. The inferior vena cava is normal in size with greater than 50% respiratory variability, suggesting right atrial pressure of 3 mmHg. Comparison(s): Prior images reviewed side by side. LVEF remains normal range. Mild mitral regurgitation. FINDINGS  Left Ventricle: Left ventricular ejection fraction, by estimation, is 65 to 70%. The left ventricle has normal function. The left ventricle has no regional wall motion abnormalities. The left ventricular internal cavity size was normal in size. There is  severe concentric left ventricular hypertrophy.  Left ventricular diastolic parameters are indeterminate. Right Ventricle: The right ventricular size is normal. No increase in right ventricular wall thickness. Right ventricular systolic function is normal. Tricuspid regurgitation signal is inadequate for assessing PA pressure. Mitral Valve: The mitral valve is grossly normal. Mild mitral valve regurgitation. Aortic Valve: The aortic valve is tricuspid. There is mild calcification of the aortic valve. There is mild aortic valve annular calcification. Aortic valve regurgitation is mild. Aortic regurgitation PHT measures 466 msec. Aortic valve sclerosis/calcification is present, without any evidence of aortic stenosis. Aortic valve mean gradient measures 3.0 mmHg. Aortic valve peak gradient measures 6.2 mmHg. Aortic valve area, by VTI measures 3.32 cm. Pulmonic Valve: The pulmonic valve was grossly normal. Pulmonic valve regurgitation is trivial. Aorta: The aortic root is normal in size and structure. Venous: The inferior vena cava is normal in size with greater than 50% respiratory variability, suggesting right atrial pressure of 3 mmHg. LEFT VENTRICLE PLAX 2D LVIDd:         4.50 cm   Diastology LVIDs:         2.80 cm   LV e' medial:    10.00 cm/s LV PW:         1.80 cm   LV E/e' medial:  5.8 LV IVS:        1.80 cm   LV e' lateral:   10.40 cm/s LVOT diam:     2.40 cm   LV E/e' lateral: 5.5 LV SV:         85 LV SV Index:   43 LVOT Area:     4.52 cm  LEFT ATRIUM         Index LA diam:    4.80 cm 2.43 cm/m  AORTIC VALVE AV Area (Vmax):    3.35 cm AV Area (Vmean):   3.27 cm AV Area (VTI):     3.32 cm AV Vmax:           124.00 cm/s AV Vmean:          84.700 cm/s AV VTI:            0.255 m AV Peak Grad:      6.2 mmHg AV Mean Grad:      3.0 mmHg LVOT Vmax:         91.70 cm/s LVOT Vmean:        61.300 cm/s LVOT VTI:          0.187 m LVOT/AV VTI ratio: 0.73 AI PHT:            466 msec  AORTA Ao Root diam: 3.50 cm Ao Asc diam:  3.70 cm MITRAL VALVE MV Area (PHT): 2.43  cm    SHUNTS MV Decel Time: 312 msec    Systemic VTI:  0.19 m MV E velocity:  57.60 cm/s  Systemic Diam: 2.40 cm MV A velocity: 75.50 cm/s MV E/A ratio:  0.76 Rozann Lesches MD Electronically signed by Rozann Lesches MD Signature Date/Time: 08/18/2021/1:03:04 PM    Final       Subjective: Denies any complaints currently, he is excited about going home today  Discharge Exam: Vitals:   08/25/21 0400 08/25/21 0752  BP: 133/70 121/60  Pulse: (!) 48 75  Resp: 14 19  Temp: (!) 97.4 F (36.3 C) (!) 97.5 F (36.4 C)  SpO2: 100% (!) 85%   Vitals:   08/24/21 1931 08/25/21 0125 08/25/21 0400 08/25/21 0752  BP: 121/63 133/77 133/70 121/60  Pulse: 74 (!) 58 (!) 48 75  Resp: 19 18 14 19   Temp: 97.8 F (36.6 C) (!) 97.4 F (36.3 C) (!) 97.4 F (36.3 C) (!) 97.5 F (36.4 C)  TempSrc: Axillary Oral Axillary Oral  SpO2: 95% 99% 100% (!) 85%  Weight:      Height:        General: Pt is alert, awake, not in acute distress Cardiovascular: RRR, S1/S2 +, no rubs, no gallops Respiratory: CTA bilaterally, no wheezing, no rhonchi Abdominal: Soft, NT, ND, bowel sounds + Extremities: no edema, no cyanosis    The results of significant diagnostics from this hospitalization (including imaging, microbiology, ancillary and laboratory) are listed below for reference.     Microbiology: No results found for this or any previous visit (from the past 240 hour(s)).   Labs: BNP (last 3 results) Recent Labs    08/12/21 1627 08/17/21 1109  BNP 91.3 865.7*   Basic Metabolic Panel: Recent Labs  Lab 08/19/21 0148 08/19/21 0435 08/20/21 0122 08/22/21 0059 08/23/21 0127  NA 135  --  136 138 135  K 4.8  --  4.8 4.9 5.0  CL 104  --  107 107 106  CO2 21*  --  22 25 24   GLUCOSE 171*  --  104* 97 165*  BUN 42*  --  34* 35* 33*  CREATININE 1.22  --  1.20 1.18 1.06  CALCIUM 7.7*  --  7.8* 8.1* 7.7*  MG  --  2.6*  --   --   --    Liver Function Tests: Recent Labs  Lab 08/19/21 0148  08/20/21 0122  AST 55* 58*  ALT 81* 91*  ALKPHOS 127* 144*  BILITOT 1.1 1.0  PROT 4.7* 4.8*  ALBUMIN 2.1* 2.2*   No results for input(s): LIPASE, AMYLASE in the last 168 hours. No results for input(s): AMMONIA in the last 168 hours. CBC: Recent Labs  Lab 08/19/21 0148 08/20/21 0122 08/22/21 0059  WBC 17.2* 15.8* 18.5*  HGB 11.9* 11.6* 11.9*  HCT 34.9* 33.3* 34.0*  MCV 85.7 88.8 89.2  PLT 383 371 380   Cardiac Enzymes: No results for input(s): CKTOTAL, CKMB, CKMBINDEX, TROPONINI in the last 168 hours. BNP: Invalid input(s): POCBNP CBG: Recent Labs  Lab 08/24/21 0747 08/24/21 1203 08/24/21 1625 08/24/21 2134 08/25/21 0754  GLUCAP 121* 208* 293* 176* 115*   D-Dimer Recent Labs    08/23/21 0127  DDIMER 2.13*   Hgb A1c No results for input(s): HGBA1C in the last 72 hours. Lipid Profile No results for input(s): CHOL, HDL, LDLCALC, TRIG, CHOLHDL, LDLDIRECT in the last 72 hours. Thyroid function studies No results for input(s): TSH, T4TOTAL, T3FREE, THYROIDAB in the last 72 hours.  Invalid input(s): FREET3 Anemia work up No results for input(s): VITAMINB12, FOLATE, FERRITIN, TIBC, IRON, RETICCTPCT in the last 72 hours. Urinalysis  Component Value Date/Time   COLORURINE YELLOW 08/12/2021 1627   APPEARANCEUR CLEAR 08/12/2021 1627   LABSPEC 1.025 08/12/2021 1627   PHURINE 6.0 08/12/2021 1627   GLUCOSEU NEGATIVE 08/12/2021 1627   HGBUR TRACE (A) 08/12/2021 1627   BILIRUBINUR NEGATIVE 08/12/2021 1627   BILIRUBINUR neg 01/14/2021 1503   KETONESUR NEGATIVE 08/12/2021 1627   PROTEINUR 100 (A) 08/12/2021 1627   UROBILINOGEN 0.2 01/14/2021 1503   NITRITE NEGATIVE 08/12/2021 1627   LEUKOCYTESUR NEGATIVE 08/12/2021 1627   Sepsis Labs Invalid input(s): PROCALCITONIN,  WBC,  LACTICIDVEN Microbiology No results found for this or any previous visit (from the past 240 hour(s)).   Time coordinating discharge: Over 30 minutes  SIGNED:   Phillips Climes,  MD  [facility] 08/25/2021, 10:08 AM Pager   If 7PM-7AM, please contact night-coverage [email or web address] Password [email or web address]

## 2021-08-25 NOTE — Discharge Instructions (Signed)
Follow with Primary MD [facility], Elnita Maxwell, MD in 10 days   Get CBC, CMP,  checked  by Primary MD next visit.    Activity: As tolerated with Full fall precautions use walker/cane & assistance as needed   Disposition Home   Diet: Heart Healthy / carb modified, with feeding assistance and aspiration precautions.   On your next visit with your primary care physician please Get Medicines reviewed and adjusted.   Please request your Prim.MD to go over all Hospital Tests and Procedure/Radiological results at the follow up, please get all Hospital records sent to your Prim MD by signing hospital release before you go home.   If you experience worsening of your admission symptoms, develop shortness of breath, life threatening emergency, suicidal or homicidal thoughts you must seek medical attention immediately by calling 911 or calling your MD immediately  if symptoms less severe.  You Must read complete instructions/literature along with all the possible adverse reactions/side effects for all the Medicines you take and that have been prescribed to you. Take any new Medicines after you have completely understood and accpet all the possible adverse reactions/side effects.   Do not drive, operating heavy machinery, perform activities at heights, swimming or participation in water activities or provide baby sitting services if your were admitted for syncope or siezures until you have seen by Primary MD or a Neurologist and advised to do so again.  Do not drive when taking Pain medications.    Do not take more than prescribed Pain, Sleep and Anxiety Medications  Special Instructions: If you have smoked or chewed Tobacco  in the last 2 yrs please stop smoking, stop any regular Alcohol  and or any Recreational drug use.  Wear Seat belts while driving.   Please note  You were cared for by a hospitalist during your hospital stay. If you have any questions about your discharge medications or the care  you received while you were in the hospital after you are discharged, you can call the unit and asked to speak with the hospitalist on call if the hospitalist that took care of you is not available. Once you are discharged, your primary care physician will handle any further medical issues. Please note that NO REFILLS for any discharge medications will be authorized once you are discharged, as it is imperative that you return to your primary care physician (or establish a relationship with a primary care physician if you do not have one) for your aftercare needs so that they can reassess your need for medications and monitor your lab values.

## 2021-08-26 ENCOUNTER — Encounter: Payer: Self-pay | Admitting: Family Medicine

## 2021-08-26 NOTE — Assessment & Plan Note (Signed)
Rocephin shot given.  Sent cefdinir and azithomax (antibiotics) Rest! Drink lots of fluids! Eat 3 meals per day. If worsening breathing, go to ED.

## 2021-09-02 ENCOUNTER — Ambulatory Visit (INDEPENDENT_AMBULATORY_CARE_PROVIDER_SITE_OTHER): Payer: Medicare HMO | Admitting: Family Medicine

## 2021-09-02 ENCOUNTER — Encounter: Payer: Self-pay | Admitting: Family Medicine

## 2021-09-02 ENCOUNTER — Other Ambulatory Visit: Payer: Self-pay

## 2021-09-02 VITALS — BP 124/76 | HR 72 | Temp 97.4°F | Ht 68.0 in | Wt 190.0 lb

## 2021-09-02 DIAGNOSIS — E871 Hypo-osmolality and hyponatremia: Secondary | ICD-10-CM | POA: Diagnosis not present

## 2021-09-02 DIAGNOSIS — I119 Hypertensive heart disease without heart failure: Secondary | ICD-10-CM | POA: Diagnosis not present

## 2021-09-02 DIAGNOSIS — M317 Microscopic polyangiitis: Secondary | ICD-10-CM | POA: Diagnosis not present

## 2021-09-02 DIAGNOSIS — J189 Pneumonia, unspecified organism: Secondary | ICD-10-CM | POA: Diagnosis not present

## 2021-09-02 DIAGNOSIS — R7989 Other specified abnormal findings of blood chemistry: Secondary | ICD-10-CM

## 2021-09-02 DIAGNOSIS — R0602 Shortness of breath: Secondary | ICD-10-CM | POA: Diagnosis not present

## 2021-09-02 LAB — COMPREHENSIVE METABOLIC PANEL
ALT: 36 IU/L (ref 0–44)
AST: 26 IU/L (ref 0–40)
Albumin/Globulin Ratio: 1.2 (ref 1.2–2.2)
Albumin: 3 g/dL — ABNORMAL LOW (ref 3.6–4.6)
Alkaline Phosphatase: 142 IU/L — ABNORMAL HIGH (ref 44–121)
BUN/Creatinine Ratio: 20 (ref 10–24)
BUN: 24 mg/dL (ref 8–27)
Bilirubin Total: 0.7 mg/dL (ref 0.0–1.2)
CO2: 27 mmol/L (ref 20–29)
Calcium: 8.1 mg/dL — ABNORMAL LOW (ref 8.6–10.2)
Chloride: 101 mmol/L (ref 96–106)
Creatinine, Ser: 1.19 mg/dL (ref 0.76–1.27)
Globulin, Total: 2.6 g/dL (ref 1.5–4.5)
Glucose: 196 mg/dL — ABNORMAL HIGH (ref 70–99)
Potassium: 5.3 mmol/L — ABNORMAL HIGH (ref 3.5–5.2)
Sodium: 137 mmol/L (ref 134–144)
Total Protein: 5.6 g/dL — ABNORMAL LOW (ref 6.0–8.5)
eGFR: 61 mL/min/{1.73_m2} (ref >59)

## 2021-09-02 LAB — CBC WITH DIFFERENTIAL/PLATELET
Basophils Absolute: 0 10*3/uL (ref 0.0–0.2)
Basos: 0 %
EOS (ABSOLUTE): 0.2 10*3/uL (ref 0.0–0.4)
Eos: 1 %
Hematocrit: 31.7 % — ABNORMAL LOW (ref 37.5–51.0)
Hemoglobin: 12 g/dL — ABNORMAL LOW (ref 13.0–17.7)
Immature Grans (Abs): 0.2 10*3/uL — ABNORMAL HIGH (ref 0.0–0.1)
Immature Granulocytes: 1 %
Lymphocytes Absolute: 0.5 10*3/uL — ABNORMAL LOW (ref 0.7–3.1)
Lymphs: 4 %
MCH: 36.3 pg — ABNORMAL HIGH (ref 26.6–33.0)
MCHC: 37.9 g/dL — ABNORMAL HIGH (ref 31.5–35.7)
MCV: 96 fL (ref 79–97)
Monocytes Absolute: 0.3 10*3/uL (ref 0.1–0.9)
Monocytes: 2 %
Neutrophils Absolute: 12.1 10*3/uL — ABNORMAL HIGH (ref 1.4–7.0)
Neutrophils: 92 %
Platelets: 210 10*3/uL (ref 150–450)
RBC: 3.31 x10E6/uL — ABNORMAL LOW (ref 4.14–5.80)
RDW: 18.5 % — ABNORMAL HIGH (ref 11.6–15.4)
WBC: 13.2 10*3/uL — ABNORMAL HIGH (ref 3.4–10.8)

## 2021-09-02 LAB — PRO B NATRIURETIC PEPTIDE: NT-Pro BNP: 1274 pg/mL — ABNORMAL HIGH (ref 0–486)

## 2021-09-02 NOTE — Progress Notes (Signed)
Subjective:  Patient ID: Kerry Matthews, male    DOB: 1936/03/14  Age: 86 y.o. MRN: [id number]  Chief Complaint  Patient presents with   Pneumonia   Transitions Of Care    HPI Patient was admitted to [facility] on 08/12/2021 to 08/25/2021. He was diagnosed with multifocal pneumonia, Acute Respiratory failure up to 15 L.  Respiratory disorder concurrent with and due to microscopic polyangiitis, Hyponatremia, Covid 19 virus infection. They recommended follow up with Dr Tobie Poet in 10 days. They discharged him with Oxygen 8 L . They stopped Azithromycin, cefdinir and rituxan. They recommended continue with the other medication at home. They sent him home with prednisone.   Current Outpatient Medications on File Prior to Visit  Medication Sig Dispense Refill   aspirin EC 81 MG tablet Take 81 mg by mouth daily. Swallow whole.     Coenzyme Q10 100 MG TABS Take 100 mg by mouth at bedtime.     furosemide (LASIX) 20 MG tablet Take 20 mg by mouth daily as needed for fluid.     gabapentin (NEURONTIN) 400 MG capsule Take 400 mg by mouth 2 (two) times daily.     glucose blood (TRUE METRIX BLOOD GLUCOSE TEST) test strip E11.69 TEST BLOOD SUGAR THREE TIMES DAILY BEFORE MEALS 300 strip 3   HYDROcodone bit-homatropine (HYCODAN) 5-1.5 MG/5ML syrup Take 5 mLs by mouth every 12 (twelve) hours as needed for cough. 70 mL 0   Insulin Pen Needle 29G X 5MM MISC Use with insulin pen 100 each 0   LANTUS SOLOSTAR 100 UNIT/ML Solostar Pen SMARTSIG:22 Unit(s) SUB-Q Daily     Multiple Vitamins-Minerals (PRESERVISION AREDS 2) CAPS Take 1 capsule by mouth 2 (two) times daily after a meal.     nitroGLYCERIN (NITROSTAT) 0.4 MG SL tablet Place 1 tablet (0.4 mg total) under the tongue every 5 (five) minutes as needed for chest pain. 25 tablet 6   Omega-3 Fatty Acids (FISH OIL) 1000 MG CAPS Take 2,000 mg by mouth daily.     omeprazole (PRILOSEC) 20 MG capsule Take 1 capsule (20 mg total) by mouth daily. (Patient taking  differently: Take 20 mg by mouth daily before breakfast.) 90 capsule 1   OXYGEN Inhale 4 L into the lungs daily.     predniSONE (DELTASONE) 10 MG tablet Take 6 tablets (60 mg total) by mouth daily with breakfast for 6 days, THEN 5 tablets (50 mg total) daily with breakfast for 6 days, THEN 4 tablets (40 mg total) daily with breakfast for 6 days, THEN 3 tablets (30 mg total) daily with breakfast for 6 days, THEN 2 tablets (20 mg total) daily with breakfast for 6 days, THEN 1 tablet (10 mg total) daily with breakfast for 6 days. 126 tablet 0   RELION PEN NEEDLE 31G/8MM 31G X 8 MM MISC USE WITH INSULIN PEN     rosuvastatin (CRESTOR) 20 MG tablet Take 1 tablet (20 mg total) by mouth daily. (Patient taking differently: Take 20 mg by mouth at bedtime.) 90 tablet 3   vitamin B-12 (CYANOCOBALAMIN) 1000 MCG tablet Take 1 tablet (1,000 mcg total) by mouth daily. (Patient taking differently: Take 1,000 mcg by mouth daily with breakfast.) 30 tablet 0   No current facility-administered medications on file prior to visit.   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  ANCA antibodies- myeloperoxidase ABS-greater than 100, ANCA proteinase 3-less than 3.5 04/02/2019- ANCA titers- p-ANCA +1: 160, C ANCA-less  than 1: 20, atypical p-ANCA titer-less than 1: 20   Abnormal CT of the chest    Abnormal findings on diagnostic imaging of lung 04/12/2019   04/01/2019-CT chest with contrast- no acute process in chest abdomen or pelvis, interstitial lung disease suspicious for early or mild UIP, pulmonary artery enlargement suggest PAH    Acute low back pain without sciatica 03/08/2020   Acute low back pain without sciatica 03/08/2020   Acute lower UTI 07/07/2020   Acute pain of left knee 11/30/2019   AKI (acute kidney injury) ([place])    Alpha-1-antitrypsin deficiency carrier 05/04/2019   Anemia    Atherosclerotic heart disease of native coronary artery without angina pectoris  05/03/2018   Body mass index (BMI) 29.0-29.9, adult 01/05/2020   Bradycardia    Burning sensation of feet 01/05/2020   CKD (chronic kidney disease) 05/03/2018   Community acquired pneumonia    Community acquired pneumonia    Coronary artery disease    Cough    Diabetes ([place])    Diabetes mellitus type 2 in obese ([place]) 04/01/2019   Dyslipidemia associated with type 2 diabetes mellitus ([place]) 05/03/2018   Dysuria 09/07/2020   Elevated LFTs 04/01/2019   Elevated rheumatoid factor 04/12/2019   04/03/2019-rheumatoid factor-95.4   Emphysema (subcutaneous) (surgical) resulting from a procedure 05/03/2018   Essential hypertension 05/03/2018   Fatigue 03/29/2021   Fever    GAD (generalized anxiety disorder)    GERD (gastroesophageal reflux disease)    Hematuria 09/07/2020   History of alpha-1-antitrypsin deficiency 04/12/2019   History of kidney stones    Hyperlipidemia    Hypoxemia    Idiopathic pulmonary fibrosis ([place]) 03/29/2021   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   Insomnia 05/03/2018   Interstitial pulmonary disease ([place]) 04/12/2019   04/01/2019-CT chest with contrast- no acute process in chest abdomen or pelvis, interstitial lung disease suspicious for early or mild UIP, pulmonary artery enlargement suggest PAH  04/02/2019-connective tissue work-up: Anti-Jo 1-negative Anti-DNA antibody double-stranded-negative Anti-scleroderma antibody-negative Sjogren's syndrome antibody-negative Sjogren's syndrome antibody-negative CK-31 CCP-6 E   Leukocytosis 03/08/2020   Low vitamin B12 level 04/03/2019   Lower extremity edema    Medicare annual wellness visit, subsequent 08/09/2020   Microscopic polyangiitis ([place]) 03/29/2021   Mixed hyperlipidemia 05/03/2018   Myalgia 04/19/2019   Osteoarthritis    Other emphysema ([place])    Other long term (current) drug therapy 03/29/2021   Paresthesias in left hand 11/30/2019   Peripheral polyneuropathy 01/30/2020   Pneumonia 04/01/2019   Pneumonitis     Pneumonitis    Polymyositis ([place])    Primary insomnia    Proteinuria 09/07/2020   Renal insufficiency 09/06/2020   Renal stones    Rheumatoid factor positive 03/29/2021   Sepsis ([place]) 04/19/2019   Severe sepsis ([place]) 07/07/2020   Skin cancer    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/17/2020   Transaminitis    Trigger finger 05/03/2018   UC (ulcerative colitis) ([place])    Urinary urgency 09/07/2020   UTI (urinary tract infection)    Vasculitis ([place]) 03/29/2021   Past Surgical History:  Procedure Laterality Date   ANGIOPLASTY  2010   BACK SURGERY     CATARACT EXTRACTION     COLONOSCOPY  06/16/2005   Mild colitis involving splenic flexure. Colonic polyps, status post polypectomy. Mild pancolonic diverticulitits. Internal hemorrhoids.    ESOPHAGOGASTRODUODENOSCOPY  04/26/2003   Irregular Z line suggestive of GERD. Mild gastritis status post CLO testing.    TOTAL KNEE ARTHROPLASTY Left 12/17/2020  Procedure: LEFT TOTAL KNEE ARTHROPLASTY;  Surgeon: Leandrew Koyanagi, MD;  Location: [place];  Service: Orthopedics;  Laterality: Left;   TRIGGER FINGER RELEASE      Family History  Problem Relation Age of Onset   Tuberculosis Mother    Stroke Father    Pancreatic cancer Sister    Heart attack Sister    Lung disease Sister    Clotting disorder Brother    Colon cancer Neg Hx    Esophageal cancer Neg Hx    Social History   Socioeconomic History   Marital status: Married    Spouse name: Not on file   Number of children: 2   Years of education: Not on file   Highest education level: Not on file  Occupational History   Occupation: retired  Tobacco Use   Smoking status: Never   Smokeless tobacco: Never  Vaping Use   Vaping Use: Never used  Substance and Sexual Activity   Alcohol use: Never   Drug use: Never   Sexual activity: Not on file  Other Topics Concern   Not on file  Social History Narrative   Not on file   Social Determinants of Health   Financial Resource Strain: Not  on file  Food Insecurity: Not on file  Transportation Needs: Not on file  Physical Activity: Not on file  Stress: Not on file  Social Connections: Not on file    Review of Systems  Constitutional:  Positive for activity change and fatigue. Negative for chills and fever.  HENT:  Positive for sore throat. Negative for congestion and ear pain.   Respiratory:  Positive for cough (pain radiates down left side of rib cage) and shortness of breath.   Cardiovascular:  Positive for leg swelling. Negative for chest pain.  Gastrointestinal:  Negative for abdominal pain, constipation, nausea and vomiting.  Endocrine: Positive for polydipsia, polyphagia and polyuria.  Musculoskeletal:  Negative for arthralgias and back pain.  Neurological:  Negative for dizziness and headaches.  Psychiatric/Behavioral:  Negative for dysphoric mood. The patient is not nervous/anxious.     Objective:  BP 124/76 (BP Location: Right Arm, Patient Position: Sitting)    Pulse 72    Temp (!) 97.4 F (36.3 C) (Temporal)    Ht 5\' 8"  (1.727 m)    Wt 190 lb (86.2 kg)    SpO2 93% Comment: On oxygen   BMI 28.89 kg/m   Reviewed BP, Pulse, Temperature logs from home all similar to above.  Sugars: 89-120  BP/Weight 09/03/2021 09/26/2977 04/02/2118  Systolic BP 417 408 144  Diastolic BP 64 76 59  Wt. (Lbs) 189 190 -  BMI 29.6 28.89 -    Physical Exam Constitutional:      Appearance: Normal appearance.     Comments: Tired appearing. On oxygen 4 L.  HENT:     Right Ear: Tympanic membrane normal.     Left Ear: Tympanic membrane normal.     Nose: Nose normal. No congestion or rhinorrhea.     Mouth/Throat:     Mouth: Mucous membranes are moist.     Pharynx: No oropharyngeal exudate or posterior oropharyngeal erythema.  Cardiovascular:     Rate and Rhythm: Normal rate and regular rhythm.     Heart sounds: Normal heart sounds.  Pulmonary:     Effort: Pulmonary effort is normal. No respiratory distress.     Breath sounds:  Rales (BL bases) present. No wheezing or rhonchi.  Abdominal:     General: Bowel  sounds are normal.     Palpations: Abdomen is soft.     Tenderness: There is no abdominal tenderness.  Musculoskeletal:     Right lower leg: Edema (trace) present.     Left lower leg: Edema (trace) present.  Lymphadenopathy:     Cervical: Cervical adenopathy present.  Neurological:     Mental Status: He is alert.  Psychiatric:        Mood and Affect: Mood normal.        Behavior: Behavior normal.    Diabetic Foot Exam - Simple   No data filed      Lab Results  Component Value Date   WBC 13.2 (H) 09/02/2021   HGB 12.0 (L) 09/02/2021   HCT 31.7 (L) 09/02/2021   PLT 210 09/02/2021   GLUCOSE 196 (H) 09/02/2021   CHOL 158 08/07/2021   TRIG 122 08/07/2021   HDL 30 (L) 08/07/2021   LDLCALC 106 (H) 08/07/2021   ALT 36 09/02/2021   AST 26 09/02/2021   NA 137 09/02/2021   K 5.3 (H) 09/02/2021   CL 101 09/02/2021   CREATININE 1.19 09/02/2021   BUN 24 09/02/2021   CO2 27 09/02/2021   TSH 2.830 04/04/2021   INR 1.1 12/13/2020   HGBA1C 6.3 (H) 08/07/2021   MICROALBUR 10 04/11/2021   Assessment & Plan:   Problem List Items Addressed This Visit       Cardiovascular and Mediastinum   Hypertensive heart disease without heart failure   Relevant Medications   aspirin EC 81 MG tablet   Other Relevant Orders   CBC with Differential/Platelet (Completed)   Comprehensive metabolic panel (Completed)   Microscopic polyangiitis (HCC)   Relevant Medications   aspirin EC 81 MG tablet     Respiratory   Pneumonia of both lungs due to infectious organism - Primary     Other   Hyponatremia    Check cmp      Shortness of breath    Check probnp, cbc,  Concerning for pulmonary edema. I do not know how he sounded when discharged.  Recommend lasix 20 mg once last night (only had expired rx, but was going to check for utd rx.) Scheduled to see Pulmonary tomorrow.      Relevant Orders   Pro b  natriuretic peptide (Completed)  .  No orders of the defined types were placed in this encounter.   Orders Placed This Encounter  Procedures   CBC with Differential/Platelet   Comprehensive metabolic panel   Pro b natriuretic peptide     Follow-up: No follow-ups on file.  An After Visit Summary was printed and given to the patient.  [name], MD [facility] [phone]

## 2021-09-03 ENCOUNTER — Ambulatory Visit: Payer: Medicare HMO | Admitting: Pulmonary Disease

## 2021-09-03 ENCOUNTER — Ambulatory Visit (INDEPENDENT_AMBULATORY_CARE_PROVIDER_SITE_OTHER): Payer: Medicare HMO

## 2021-09-03 ENCOUNTER — Encounter: Payer: Self-pay | Admitting: Pulmonary Disease

## 2021-09-03 ENCOUNTER — Encounter: Payer: Self-pay | Admitting: *Deleted

## 2021-09-03 VITALS — BP 126/64 | HR 63 | Temp 97.5°F | Ht 67.0 in | Wt 189.0 lb

## 2021-09-03 DIAGNOSIS — J849 Interstitial pulmonary disease, unspecified: Secondary | ICD-10-CM

## 2021-09-03 DIAGNOSIS — J189 Pneumonia, unspecified organism: Secondary | ICD-10-CM | POA: Diagnosis not present

## 2021-09-03 DIAGNOSIS — U099 Post covid-19 condition, unspecified: Secondary | ICD-10-CM

## 2021-09-03 DIAGNOSIS — R059 Cough, unspecified: Secondary | ICD-10-CM | POA: Diagnosis not present

## 2021-09-03 DIAGNOSIS — I517 Cardiomegaly: Secondary | ICD-10-CM | POA: Diagnosis not present

## 2021-09-03 DIAGNOSIS — R0602 Shortness of breath: Secondary | ICD-10-CM | POA: Insufficient documentation

## 2021-09-03 MED ORDER — FUROSEMIDE 40 MG PO TABS: 40.0000 mg | ORAL_TABLET | Freq: Every day | ORAL | 0 refills | Status: DC

## 2021-09-03 MED ORDER — PREDNISONE 20 MG PO TABS: ORAL_TABLET | ORAL | 0 refills | Status: DC

## 2021-09-03 NOTE — Assessment & Plan Note (Signed)
Check probnp, cbc,  Concerning for pulmonary edema. I do not know how he sounded when discharged.  Recommend lasix 20 mg once last night (only had expired rx, but was going to check for utd rx.) Scheduled to see Pulmonary tomorrow.

## 2021-09-03 NOTE — Patient Instructions (Addendum)
We will get the chest x-ray today to reevaluate the lung Go back on the prednisone to 60 mg Start Lasix 40 mg a day  Return to clinic in 2 weeks with me or APP for recheck of metabolic panel and reassessmen

## 2021-09-03 NOTE — Progress Notes (Signed)
Kerry Matthews    [id number]    December 12, 1935  Primary Care Physician:Cox, Elnita Maxwell, MD  Referring Physician: [name], MD [address] [place],  [place] [address]  Chief complaint: Follow up for CTD interstitial lung disease, alpha-1 antitrypsin carrier  HPI: 86 year old with pulmonary fibrosis on CT chest. He has complains of joint stiffness, generalized myalgias, low-grade fevers starting July 2020.  The symptoms generally respond to prednisone.  Denies any renal symptoms, difficulty swallowing.  Ruled out for COVID- 19 multiple times.  He has seen Dr. Trudie Reed, Rheumatology. Serologies reviewed which show elevated p-ANCA, myeloperoxidase with elevated CRP, normal sed rate, elevated IgG4 subclass, repeat rheumatoid factor is negative. 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  Was initially on prednisone and eventually got started on Rituxan in late 2020 with tapering off of prednisone.  Symptoms have been stable while on Rituxan  Pets: Has a dog.  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 No mold, hot tub, Jacuzzi, down, humidifier Smoking history: Never smoker Travel history: No significant travel history Relevant family history: 2 sisters died of pulmonary fibrosis of unclear etiology.  1 of the sisters also had COPD  Interim history:  He has been lost to pulmonary clinic since 2021.  In the interim he had been maintained on Rituxan by Dr. Trudie Reed from dermatology with good control of symptoms  He developed COVID-19 infection on 07/29/2021 which was treated as an outpatient with Molnupiravir.  However his situation deteriorated and was admitted to [facility] on  08/12/2021 to 08/25/2021. He was diagnosed with multifocal pneumonia, Acute Respiratory failure up to 15 L.  Treated with convalescent plasma, IV steroids which was transitioned to prednisone 60 mg.  Discharged on supplemental oxygen  Prednisone reduced as an outpatient to 50 mg on 08/29/2021.  He had a PCP visit yesterday with crackles on examination.  Labs showed elevated BNP and he was told to resume Lasix.  Outpatient Encounter Medications as of 09/03/2021  Medication Sig   aspirin EC 81 MG tablet Take 81 mg by mouth daily. Swallow whole.   Coenzyme Q10 100 MG TABS Take 100 mg by mouth at bedtime.   furosemide (LASIX) 20 MG tablet Take 20 mg by mouth daily as needed for fluid.   gabapentin (NEURONTIN) 400 MG capsule Take 400 mg by mouth 2 (two) times daily.   glucose blood (TRUE METRIX BLOOD GLUCOSE TEST) test strip E11.69 TEST BLOOD SUGAR THREE TIMES DAILY BEFORE MEALS   HYDROcodone bit-homatropine (HYCODAN) 5-1.5 MG/5ML syrup Take 5 mLs by mouth every 12 (twelve) hours as needed for cough.   LANTUS SOLOSTAR 100 UNIT/ML Solostar Pen SMARTSIG:22 Unit(s) SUB-Q Daily   Multiple Vitamins-Minerals (PRESERVISION AREDS 2) CAPS Take 1 capsule by mouth 2 (two) times daily after a meal.   nitroGLYCERIN (NITROSTAT) 0.4 MG SL tablet Place 1 tablet (0.4 mg total) under the tongue every 5 (five) minutes as needed for chest pain.   Omega-3 Fatty Acids (FISH OIL) 1000 MG CAPS Take 2,000 mg by mouth daily.   omeprazole (PRILOSEC) 20 MG capsule Take 1 capsule (20 mg total) by mouth daily. (Patient taking differently: Take 20 mg by mouth daily before breakfast.)   OXYGEN Inhale 4 L  into the lungs daily.   predniSONE (DELTASONE) 10 MG tablet Take 6 tablets (60 mg total) by mouth daily with breakfast for 6 days, THEN 5 tablets (50 mg total) daily with breakfast for 6 days, THEN 4 tablets (40 mg total) daily with breakfast for 6 days, THEN 3 tablets (30 mg total) daily with breakfast for 6 days, THEN 2 tablets (20 mg  total) daily with breakfast for 6 days, THEN 1 tablet (10 mg total) daily with breakfast for 6 days.   RELION PEN NEEDLE 31G/8MM 31G X 8 MM MISC USE WITH INSULIN PEN   rosuvastatin (CRESTOR) 20 MG tablet Take 1 tablet (20 mg total) by mouth daily. (Patient taking differently: Take 20 mg by mouth at bedtime.)   vitamin B-12 (CYANOCOBALAMIN) 1000 MCG tablet Take 1 tablet (1,000 mcg total) by mouth daily. (Patient taking differently: Take 1,000 mcg by mouth daily with breakfast.)   Insulin Pen Needle 29G X 5MM MISC Use with insulin pen   No facility-administered encounter medications on file as of 09/03/2021.   Physical Exam: Blood pressure 126/64, pulse 63, temperature (!) 97.5 F (36.4 C), temperature source Oral, height 5\' 7"  (1.702 m), weight 189 lb (85.7 kg), SpO2 94 %. Gen:      No acute distress HEENT:  EOMI, sclera anicteric Neck:     No masses; no thyromegaly Lungs:    Bibasal crackles CV:         Regular rate and rhythm; no murmurs Abd:      + bowel sounds; soft, non-tender; no palpable masses, no distension Ext:    No edema; adequate peripheral perfusion Skin:      Warm and dry; no rash Neuro: alert and oriented x 3 Psych: normal mood and affect   Data Reviewed: Imaging: CT high-resolution 04/19/2019- patchy groundglass attenuation, septal thickening with reticulation and mild bronchiectasis with basal gradient.  No definitive honeycombing Probable UIP.   CT high-resolution 10/14/2019-stable findings of interstitial lung disease, pulmonary fibrosis and probable UIP pattern  CTA 08/12/2021-no pulmonary embolism, patchy multifocal airspace disease  CTA 08/18/2021-no PE, extensive bilateral homogeneous groundglass opacities with septal thickening, fibrotic changes at the bases. I have reviewed the images personally  PFTs: 05/03/2019 FVC 3.22 [92%], FEV1 2.57 [104%], F/F 80, TLC 4.85 [95%], DLCO 18.93 [85%] Minimal restriction  Labs: 04/02/2019- p-ANCA 1:160, c-ANCA negative,  MPO>100, rheumatoid factor 95 CCP negative, Sjogren's antibody negative, SCL 70- negative Jo 1-negative, aldolase 6.3, CK 31  Alpha-1 antitrypsin 04/12/2019- 124, PI MZ  Assessment:  Recent hospitalization for COVID-19, multifocal bacterial pneumonia He is making slow recovery posthospitalization.  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  He is currently on prednisone at 50 mg.  I will increase it back to 60 mg as he has increasing crackles and dyspnea with reduction in dose.  He will need a slow taper over the next couple of months  May also have fluid overload given lower extremity edema and elevated BNP.  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 Will need follow-up high-res CT and PFTs later this year to evaluate underlying interstitial lung disease and pulmonary fibrosis  Alpha-1 antitrypsin carrier status His A1AT levels are normal with no obstruction on pulmonary function test.  LFTs are normal Continue monitoring.  No need for treatment.  Plan/Recommendations: Chest x-ray Continue prednisone at 60 mg  for now Lasix 40 mg a day  [name] MD [facility] 09/03/2021, 9:56 AM  CC: [name], MD

## 2021-09-03 NOTE — Assessment & Plan Note (Signed)
Check cmp 

## 2021-09-05 ENCOUNTER — Telehealth: Payer: Self-pay

## 2021-09-05 NOTE — Telephone Encounter (Signed)
Wife calling with update of pt. Pt saw pulmonology 09/03/21. Pulmonology increased prednisone back to 60 mg with taper and sent for cxray. Also prescribed furosemide 40 mg.    Kerry Matthews, Volanda Napoleon 09/05/21 2:23 PM

## 2021-09-09 DIAGNOSIS — D631 Anemia in chronic kidney disease: Secondary | ICD-10-CM

## 2021-09-09 DIAGNOSIS — I700 Atherosclerosis of aorta: Secondary | ICD-10-CM

## 2021-09-09 DIAGNOSIS — J90 Pleural effusion, not elsewhere classified: Secondary | ICD-10-CM

## 2021-09-09 DIAGNOSIS — J8489 Other specified interstitial pulmonary diseases: Secondary | ICD-10-CM

## 2021-09-09 DIAGNOSIS — J1282 Pneumonia due to coronavirus disease 2019: Secondary | ICD-10-CM | POA: Diagnosis not present

## 2021-09-09 DIAGNOSIS — K219 Gastro-esophageal reflux disease without esophagitis: Secondary | ICD-10-CM

## 2021-09-09 DIAGNOSIS — M317 Microscopic polyangiitis: Secondary | ICD-10-CM

## 2021-09-09 DIAGNOSIS — J9601 Acute respiratory failure with hypoxia: Secondary | ICD-10-CM | POA: Diagnosis not present

## 2021-09-09 DIAGNOSIS — N1831 Chronic kidney disease, stage 3a: Secondary | ICD-10-CM

## 2021-09-09 DIAGNOSIS — D72829 Elevated white blood cell count, unspecified: Secondary | ICD-10-CM

## 2021-09-09 DIAGNOSIS — J159 Unspecified bacterial pneumonia: Secondary | ICD-10-CM | POA: Diagnosis not present

## 2021-09-09 DIAGNOSIS — I2510 Atherosclerotic heart disease of native coronary artery without angina pectoris: Secondary | ICD-10-CM

## 2021-09-09 DIAGNOSIS — U071 COVID-19: Secondary | ICD-10-CM | POA: Diagnosis not present

## 2021-09-09 DIAGNOSIS — E1122 Type 2 diabetes mellitus with diabetic chronic kidney disease: Secondary | ICD-10-CM

## 2021-09-12 ENCOUNTER — Telehealth: Payer: Self-pay | Admitting: Pulmonary Disease

## 2021-09-12 NOTE — Telephone Encounter (Signed)
Patient called and was informing us that he has lost 5 pounds in 6 days and is wanting to know about his lasix being decreased. Told patient I would route message to doctor and when I get a reply I would call him to let him know.   Dr Vaughan Browner please advise

## 2021-09-13 NOTE — Telephone Encounter (Signed)
Called patient and let him know for his lasix 20mg  to cut the pill in half and keep track of his weight and keep [place] informed of his weight. Patient stated that he understood and would let us know his weigh changes next week. Nothing further needed at this time

## 2021-09-13 NOTE — Telephone Encounter (Signed)
Please inform him to go to Lasix 20 mg a day.  He can cut the pill in half.  I will reassess at time of return visit later this month

## 2021-09-19 ENCOUNTER — Inpatient Hospital Stay: Payer: Medicare HMO | Admitting: Pulmonary Disease

## 2021-09-20 ENCOUNTER — Encounter: Payer: Self-pay | Admitting: Pulmonary Disease

## 2021-09-20 ENCOUNTER — Other Ambulatory Visit: Payer: Self-pay

## 2021-09-20 ENCOUNTER — Ambulatory Visit (INDEPENDENT_AMBULATORY_CARE_PROVIDER_SITE_OTHER): Payer: Medicare HMO

## 2021-09-20 ENCOUNTER — Ambulatory Visit: Payer: Medicare HMO | Admitting: Pulmonary Disease

## 2021-09-20 ENCOUNTER — Other Ambulatory Visit: Payer: Self-pay | Admitting: *Deleted

## 2021-09-20 VITALS — BP 138/56 | HR 69 | Temp 97.5°F | Ht 67.0 in | Wt 183.0 lb

## 2021-09-20 DIAGNOSIS — Z8701 Personal history of pneumonia (recurrent): Secondary | ICD-10-CM | POA: Diagnosis not present

## 2021-09-20 DIAGNOSIS — R0602 Shortness of breath: Secondary | ICD-10-CM

## 2021-09-20 DIAGNOSIS — R918 Other nonspecific abnormal finding of lung field: Secondary | ICD-10-CM | POA: Diagnosis not present

## 2021-09-20 DIAGNOSIS — R0600 Dyspnea, unspecified: Secondary | ICD-10-CM | POA: Diagnosis not present

## 2021-09-20 LAB — COMPREHENSIVE METABOLIC PANEL
ALT: 28 U/L (ref 0–53)
AST: 19 U/L (ref 0–37)
Albumin: 3.6 g/dL (ref 3.5–5.2)
Alkaline Phosphatase: 94 U/L (ref 39–117)
BUN: 24 mg/dL — ABNORMAL HIGH (ref 6–23)
CO2: 29 mEq/L (ref 19–32)
Calcium: 8.9 mg/dL (ref 8.4–10.5)
Chloride: 97 mEq/L (ref 96–112)
Creatinine, Ser: 1.25 mg/dL (ref 0.40–1.50)
GFR: 53.09 mL/min — ABNORMAL LOW (ref >60.00)
Glucose, Bld: 207 mg/dL — ABNORMAL HIGH (ref 70–99)
Potassium: 4.8 mEq/L (ref 3.5–5.1)
Sodium: 134 mEq/L — ABNORMAL LOW (ref 135–145)
Total Bilirubin: 0.7 mg/dL (ref 0.2–1.2)
Total Protein: 6.5 g/dL (ref 6.0–8.3)

## 2021-09-20 LAB — CBC WITH DIFFERENTIAL/PLATELET
Basophils Absolute: 0 10*3/uL (ref 0.0–0.1)
Basophils Relative: 0.2 % (ref 0.0–3.0)
Eosinophils Absolute: 0 10*3/uL (ref 0.0–0.7)
Eosinophils Relative: 0.1 % (ref 0.0–5.0)
HCT: 43.4 % (ref 39.0–52.0)
Hemoglobin: 14.3 g/dL (ref 13.0–17.0)
Lymphocytes Relative: 5.1 % — ABNORMAL LOW (ref 12.0–46.0)
Lymphs Abs: 1 10*3/uL (ref 0.7–4.0)
MCHC: 32.9 g/dL (ref 30.0–36.0)
MCV: 88 fl (ref 78.0–100.0)
Monocytes Absolute: 0.5 10*3/uL (ref 0.1–1.0)
Monocytes Relative: 2.7 % — ABNORMAL LOW (ref 3.0–12.0)
Neutro Abs: 18.4 10*3/uL — ABNORMAL HIGH (ref 1.4–7.7)
Neutrophils Relative %: 91.8 % — ABNORMAL HIGH (ref 43.0–77.0)
Platelets: 217 10*3/uL (ref 150.0–400.0)
RBC: 4.94 Mil/uL (ref 4.22–5.81)
RDW: 17.6 % — ABNORMAL HIGH (ref 11.5–15.5)
WBC: 20 10*3/uL (ref 4.0–10.5)

## 2021-09-20 MED ORDER — LEVOFLOXACIN 750 MG PO TABS: 750.0000 mg | ORAL_TABLET | Freq: Every day | ORAL | 0 refills | Status: DC

## 2021-09-20 NOTE — Progress Notes (Signed)
Kerry Matthews    [id number]    [birth year]  Primary Care Physician:Cox, Elnita Maxwell, MD  Referring Physician: [name], MD [address] [place],  [place] [address]  Chief complaint: Follow up for CTD interstitial lung disease, alpha-1 antitrypsin carrier  HPI: 86 year old with pulmonary fibrosis on CT chest. He has complains of joint stiffness, generalized myalgias, low-grade fevers starting July 2020.  The symptoms generally respond to prednisone.  Denies any renal symptoms, difficulty swallowing.  Ruled out for COVID- 19 multiple times.  He has seen Dr. Trudie Reed, Rheumatology. Serologies reviewed which show elevated p-ANCA, myeloperoxidase with elevated CRP, normal sed rate, elevated IgG4 subclass, repeat rheumatoid factor is negative. 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  Was initially on prednisone and eventually got started on Rituxan by Dr. Trudie Reed from rheumatology in late 2020 with tapering off of prednisone.  Symptoms have been stable while on Rituxan  He developed COVID-19 infection on 07/29/2021 which was treated as an outpatient with Molnupiravir.  However his situation deteriorated and was admitted to [facility] on 08/12/2021 to 08/25/2021. He was diagnosed with multifocal pneumonia, Acute Respiratory failure up to 15 L.  Treated with convalescent plasma, IV steroids which was transitioned to prednisone 60 mg.  Discharged on supplemental oxygen  Pets: Has a dog.  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 No mold, hot tub, Jacuzzi, down, humidifier Smoking history: Never smoker Travel history: No significant travel history Relevant family history: 2  sisters died of pulmonary fibrosis of unclear etiology.  1 of the sisters also had COPD  Interim history:  At last visit he had worsening dyspnea after discharge from COVID-19 infection.  We increase the Lasix to 40 mg a day and prednisone to 60 mg a day Overall is done well and his oxygen requirements have reduced to 1.5 L He is more active and feels better His health care nurse noted some crackles at the base yesterday  Outpatient Encounter Medications as of 09/20/2021  Medication Sig   aspirin EC 81 MG tablet Take 81 mg by mouth daily. Swallow whole.   Coenzyme Q10 100 MG TABS Take 100 mg by mouth at bedtime.   furosemide (LASIX) 40 MG tablet Take 1 tablet (40 mg total) by mouth daily. (Patient taking differently: Take 20 mg by mouth daily.)   gabapentin (NEURONTIN) 400 MG capsule Take 400 mg by mouth 2 (two) times daily.   glucose blood (TRUE METRIX BLOOD GLUCOSE TEST) test strip E11.69 TEST BLOOD SUGAR THREE TIMES DAILY BEFORE MEALS   LANTUS SOLOSTAR 100 UNIT/ML Solostar Pen SMARTSIG:22 Unit(s) SUB-Q Daily   Multiple Vitamins-Minerals (PRESERVISION AREDS 2) CAPS Take 1 capsule by mouth 2 (two) times daily after a meal.   nitroGLYCERIN (NITROSTAT) 0.4 MG SL tablet Place 1 tablet (0.4 mg total) under the tongue every 5 (five) minutes as needed for chest pain.   Omega-3 Fatty Acids (FISH OIL) 1000 MG CAPS Take 2,000 mg by mouth daily.   omeprazole (PRILOSEC) 20 MG capsule Take 1 capsule (20 mg total) by mouth daily. (Patient taking differently: Take 20 mg by mouth daily before breakfast.)   OXYGEN Inhale 2 L into the lungs daily.   predniSONE (DELTASONE) 20 MG tablet  Take 60mg  daily   RELION PEN NEEDLE 31G/8MM 31G X 8 MM MISC USE WITH INSULIN PEN   rosuvastatin (CRESTOR) 20 MG tablet Take 1 tablet (20 mg total) by mouth daily. (Patient taking differently: Take 20 mg by mouth at bedtime.)   vitamin B-12 (CYANOCOBALAMIN) 1000 MCG tablet Take 1 tablet (1,000 mcg total) by mouth daily. (Patient  taking differently: Take 1,000 mcg by mouth daily with breakfast.)   furosemide (LASIX) 20 MG tablet Take 20 mg by mouth daily as needed for fluid.   HYDROcodone bit-homatropine (HYCODAN) 5-1.5 MG/5ML syrup Take 5 mLs by mouth every 12 (twelve) hours as needed for cough.   Insulin Pen Needle 29G X 5MM MISC Use with insulin pen   predniSONE (DELTASONE) 10 MG tablet Take 6 tablets (60 mg total) by mouth daily with breakfast for 6 days, THEN 5 tablets (50 mg total) daily with breakfast for 6 days, THEN 4 tablets (40 mg total) daily with breakfast for 6 days, THEN 3 tablets (30 mg total) daily with breakfast for 6 days, THEN 2 tablets (20 mg total) daily with breakfast for 6 days, THEN 1 tablet (10 mg total) daily with breakfast for 6 days.   No facility-administered encounter medications on file as of 09/20/2021.   Physical Exam: Blood pressure (!) 138/56, pulse 69, temperature (!) 97.5 F (36.4 C), temperature source Oral, height 5\' 7"  (1.702 m), weight 183 lb (83 kg), SpO2 93 %. Gen:      No acute distress HEENT:  EOMI, sclera anicteric Neck:     No masses; no thyromegaly Lungs:    Clear to auscultation bilaterally; normal respiratory effort CV:         Regular rate and rhythm; no murmurs Abd:      + bowel sounds; soft, non-tender; no palpable masses, no distension Ext:    No edema; adequate peripheral perfusion Skin:      Warm and dry; no rash Neuro: alert and oriented x 3 Psych: normal mood and affect   Data Reviewed: Imaging: CT high-resolution 04/19/2019- patchy groundglass attenuation, septal thickening with reticulation and mild bronchiectasis with basal gradient.  No definitive honeycombing Probable UIP.   CT high-resolution 10/14/2019-stable findings of interstitial lung disease, pulmonary fibrosis and probable UIP pattern  CTA 08/12/2021-no pulmonary embolism, patchy multifocal airspace disease  CTA 08/18/2021-no PE, extensive bilateral homogeneous groundglass opacities with  septal thickening, fibrotic changes at the bases.  Chest x-ray 09/03/2021-patchy peripheral interstitial abnormalities, cardiomegaly. I have reviewed the images personally  PFTs: 05/03/2019 FVC 3.22 [92%], FEV1 2.57 [104%], F/F 80, TLC 4.85 [95%], DLCO 18.93 [85%] Minimal restriction  Labs: 04/02/2019- p-ANCA 1:160, c-ANCA negative, MPO>100, rheumatoid factor 95 CCP negative, Sjogren's antibody negative, SCL 70- negative Jo 1-negative, aldolase 6.3, CK 31  Alpha-1 antitrypsin 04/12/2019- 124, PI MZ  Assessment:  Recent hospitalization for COVID-19, multifocal bacterial pneumonia He is making slow recovery posthospitalization.  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  He is currently on prednisone at 60 mg.  He will need a slow taper over the next couple of months  CHF, HFpEF Currently on Lasix of 40 mg a day he was on Lasix 40 mg a day with improvement in weight and edema.  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 Will need follow-up high-res CT and PFTs later this year to evaluate underlying interstitial lung disease  and pulmonary fibrosis  Alpha-1 antitrypsin carrier status His A1AT levels are normal with no obstruction on pulmonary function test.  LFTs are normal Continue monitoring.  No need for treatment.  Plan/Recommendations: Chest x-ray, labs Continue prednisone at 60 mg for now Lasix 20 mg a day  [name] MD [facility] 09/20/2021, 11:10 AM  CC: [name], MD

## 2021-09-20 NOTE — Patient Instructions (Signed)
Continue the prednisone at 60 mg a day Continue Lasix 20 mg a day We will get some labs today including comprehensive metabolic panel, proBNP for dyspnea and CBC Based on these results may give additional recommendations on prednisone taper Continue to stay active and continue supplemental oxygen Follow-up in 2-3 months

## 2021-09-21 LAB — PRO B NATRIURETIC PEPTIDE: NT-Pro BNP: 792 pg/mL — ABNORMAL HIGH (ref 0–486)

## 2021-09-24 ENCOUNTER — Telehealth: Payer: Self-pay | Admitting: Pulmonary Disease

## 2021-09-24 NOTE — Telephone Encounter (Signed)
[name], MD  09/20/2021  4:42 PM EST     Please let patient know that chest x-ray continues to show persistent opacities and his WBC count is higher.  I would like to give him a round of antibiotics to treat for possible pneumonia Please call in levofloxacin 750 mg for 7 days   Patient's daughter, Sandy(DPR) is aware of results and voiced her understanding.  She is wanting to know when prednisone can be reduced to 50mg . She is also requesting order for CBC to be rechecked after abx. Home health can draw labs.   Dr. Vaughan Browner, please advise. thanks

## 2021-09-24 NOTE — Telephone Encounter (Signed)
Patient's sister, Sandy(DPR) is aware of recommendations and voiced her understanding. Appt scheduled 10/09/2021 at 10:30. Home health nurse will call next week for verbal on CBC.  Nothing further needed at this time.

## 2021-09-24 NOTE — Telephone Encounter (Signed)
We can reduce prednsione to 50 mg for now and hold at that dose.  Order CBC for next week  Can you please also make a sooner appointment with me or APP in 2-4 weeks. Video visit is ok

## 2021-10-02 ENCOUNTER — Telehealth: Payer: Self-pay

## 2021-10-02 NOTE — Telephone Encounter (Signed)
Spoke with [name], she VU and patient in background. They VU.   Royce Macadamia, [place] 10/02/21 4:12 PM

## 2021-10-02 NOTE — Telephone Encounter (Signed)
Lana, PHYSICAL THERAPY w/ [facility] calling due to BG readings. Patient has been on continued prednisone. In January patient's BG was ranging 128-203. It has steadily increased to today's reading of 291. Lana questioning if there needs to be changes of medications while on long duration of prednisone.   Elane Fritz #: [id number]   Please advise.   Royce Macadamia, [place] 10/02/21 10:19 AM

## 2021-10-03 ENCOUNTER — Telehealth: Payer: Self-pay

## 2021-10-03 NOTE — Telephone Encounter (Signed)
Manuela Schwartz, w/ [facility] calling as she visited patient today. BG this morning was 286 and at lunch time had risen to 309. Wife mentioned to nurse that she saw patient turning injection instead of pressing. Wife helped him inject 30 U. This was prior to lunch reading.   Spoke with Dr, she recommends giving extra 10 U today and returning to 30 U tomorrow. Wife VU and will monitor BG and understand how patient has been injecting.   Royce Macadamia, [place] 10/03/21 2:29 PM

## 2021-10-07 ENCOUNTER — Other Ambulatory Visit: Payer: Self-pay

## 2021-10-07 ENCOUNTER — Ambulatory Visit (INDEPENDENT_AMBULATORY_CARE_PROVIDER_SITE_OTHER): Payer: Medicare HMO | Admitting: Family Medicine

## 2021-10-07 VITALS — BP 110/68 | HR 70 | Temp 96.6°F | Resp 20 | Ht 65.0 in | Wt 178.0 lb

## 2021-10-07 DIAGNOSIS — Z794 Long term (current) use of insulin: Secondary | ICD-10-CM | POA: Diagnosis not present

## 2021-10-07 DIAGNOSIS — J988 Other specified respiratory disorders: Secondary | ICD-10-CM

## 2021-10-07 DIAGNOSIS — M317 Microscopic polyangiitis: Secondary | ICD-10-CM

## 2021-10-07 DIAGNOSIS — D72828 Other elevated white blood cell count: Secondary | ICD-10-CM

## 2021-10-07 DIAGNOSIS — E1165 Type 2 diabetes mellitus with hyperglycemia: Secondary | ICD-10-CM | POA: Diagnosis not present

## 2021-10-07 MED ORDER — HUMALOG KWIKPEN 200 UNIT/ML ~~LOC~~ SOPN: PEN_INJECTOR | SUBCUTANEOUS | 2 refills | Status: DC

## 2021-10-07 MED ORDER — FREESTYLE LIBRE 3 SENSOR MISC: 1.0000 | 0 refills | Status: DC

## 2021-10-07 NOTE — Progress Notes (Signed)
Acute Office Visit  Subjective:    Patient ID: Kerry Matthews, male    DOB: 04-03-1936, 86 y.o.   MRN: [id number]  Chief Complaint  Patient presents with   Diabetes    HPI: Diabetes: Patient is in today for elevated blood sugars.  His  sugars have ranged from 114-540. He called this past Friday to the provider on call and was instructed to take an additional 10 U of lantus if > 180 and 15 U of lantus if > than 15U. His sugars are running 60-110 in am, but very high in the afternoon and evening. Patient is currently on prednisone 50 mg daily. He is currently being titrated down by pulmonology. 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 His wbc count was elevated at his recent visit with pulmonology and requested repeat cbc when he came here.   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  ANCA antibodies- myeloperoxidase ABS-greater than 100, ANCA proteinase 3-less than 3.5 04/02/2019- ANCA titers- p-ANCA +1: 160, C ANCA-less than 1: 20, atypical p-ANCA titer-less than 1: 20   Abnormal CT of the chest    Abnormal findings on diagnostic imaging of lung 04/12/2019   04/01/2019-CT chest with contrast- no acute process in chest abdomen or pelvis, interstitial lung disease suspicious for early or mild UIP, pulmonary artery enlargement suggest PAH    Acute low back pain without sciatica 03/08/2020   Acute low back pain without sciatica 03/08/2020   Acute lower UTI 07/07/2020   Acute pain of left knee 11/30/2019   AKI (acute kidney injury) ([place])    Alpha-1-antitrypsin deficiency carrier 05/04/2019   Anemia    Atherosclerotic heart disease of native coronary artery without angina pectoris 05/03/2018   Body mass index (BMI) 29.0-29.9, adult 01/05/2020   Bradycardia    Burning sensation of feet 01/05/2020   CKD (chronic kidney disease) 05/03/2018   Community acquired  pneumonia    Community acquired pneumonia    Coronary artery disease    Cough    COVID-19 virus infection 08/12/2021   Diabetes ([place])    Diabetes mellitus type 2 in obese ([place]) 04/01/2019   Dyslipidemia associated with type 2 diabetes mellitus ([place]) 05/03/2018   Dysuria 09/07/2020   Elevated LFTs 04/01/2019   Elevated rheumatoid factor 04/12/2019   04/03/2019-rheumatoid factor-95.4   Emphysema (subcutaneous) (surgical) resulting from a procedure 05/03/2018   Essential hypertension 05/03/2018   Fatigue 03/29/2021   Fever    GAD (generalized anxiety disorder)    GERD (gastroesophageal reflux disease)    Hematuria 09/07/2020   History of alpha-1-antitrypsin deficiency 04/12/2019   History of kidney stones    Hyperlipidemia    Hypoxemia    Idiopathic pulmonary fibrosis ([place]) 03/29/2021   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[place]) 03/29/2021   Insomnia 05/03/2018   Interstitial pulmonary disease ([place]) 04/12/2019   04/01/2019-CT chest with contrast- no acute process in chest abdomen or pelvis, interstitial lung disease suspicious for early or mild UIP, pulmonary artery enlargement suggest PAH  04/02/2019-connective tissue work-up: Anti-Jo 1-negative Anti-DNA antibody double-stranded-negative Anti-scleroderma antibody-negative Sjogren's syndrome antibody-negative Sjogren's syndrome antibody-negative CK-31 CCP-6 E   Leukocytosis 03/08/2020   Low vitamin B12 level 04/03/2019   Lower extremity edema    Medicare annual wellness visit, subsequent 08/09/2020   Microscopic polyangiitis ([place]) 03/29/2021   Mixed hyperlipidemia 05/03/2018   Myalgia 04/19/2019   Osteoarthritis    Other emphysema ([place])  Other long term (current) drug therapy 03/29/2021   Paresthesias in left hand 11/30/2019   Peripheral polyneuropathy 01/30/2020   Pneumonia 04/01/2019   Pneumonia of both lungs due to infectious organism 08/12/2021   Pneumonitis    Pneumonitis    Polymyositis ([place])    Primary insomnia    Proteinuria 09/07/2020    Renal insufficiency 09/06/2020   Renal stones    Rheumatoid factor positive 03/29/2021   Sepsis ([place]) 04/19/2019   Severe sepsis ([place]) 07/07/2020   Skin cancer    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/17/2020   Transaminitis    Trigger finger 05/03/2018   UC (ulcerative colitis) ([place])    Urinary urgency 09/07/2020   UTI (urinary tract infection)    Vasculitis ([place]) 03/29/2021    Past Surgical History:  Procedure Laterality Date   ANGIOPLASTY  2010   BACK SURGERY     CATARACT EXTRACTION     COLONOSCOPY  06/16/2005   Mild colitis involving splenic flexure. Colonic polyps, status post polypectomy. Mild pancolonic diverticulitits. Internal hemorrhoids.    ESOPHAGOGASTRODUODENOSCOPY  04/26/2003   Irregular Z line suggestive of GERD. Mild gastritis status post CLO testing.    TOTAL KNEE ARTHROPLASTY Left 12/17/2020   Procedure: LEFT TOTAL KNEE ARTHROPLASTY;  Surgeon: Leandrew Koyanagi, MD;  Location: [facility];  Service: Orthopedics;  Laterality: Left;   TRIGGER FINGER RELEASE      Family History  Problem Relation Age of Onset   Tuberculosis Mother    Stroke Father    Pancreatic cancer Sister    Heart attack Sister    Lung disease Sister    Clotting disorder Brother    Colon cancer Neg Hx    Esophageal cancer Neg Hx     Social History   Socioeconomic History   Marital status: Married    Spouse name: Not on file   Number of children: 2   Years of education: Not on file   Highest education level: Not on file  Occupational History   Occupation: retired  Tobacco Use   Smoking status: Never   Smokeless tobacco: Never  Vaping Use   Vaping Use: Never used  Substance and Sexual Activity   Alcohol use: Never   Drug use: Never   Sexual activity: Not on file  Other Topics Concern   Not on file  Social History Narrative   Not on file   Social Determinants of Health   Financial Resource Strain: Not on file  Food Insecurity: Not on file  Transportation Needs: Not on file   Physical Activity: Not on file  Stress: Not on file  Social Connections: Not on file  Intimate Partner Violence: Not on file    Outpatient Medications Prior to Visit  Medication Sig Dispense Refill   aspirin EC 81 MG tablet Take 81 mg by mouth daily. Swallow whole.     Coenzyme Q10 100 MG TABS Take 100 mg by mouth at bedtime.     gabapentin (NEURONTIN) 400 MG capsule Take 400 mg by mouth 2 (two) times daily.     glucose blood (TRUE METRIX BLOOD GLUCOSE TEST) test strip E11.69 TEST BLOOD SUGAR THREE TIMES DAILY BEFORE MEALS 300 strip 3   Insulin Pen Needle 29G X 5MM MISC Use with insulin pen 100 each 0   Multiple Vitamins-Minerals (PRESERVISION AREDS 2) CAPS Take 1 capsule by mouth 2 (two) times daily after a meal.     nitroGLYCERIN (NITROSTAT) 0.4 MG SL tablet Place 1 tablet (0.4 mg total)  under the tongue every 5 (five) minutes as needed for chest pain. 25 tablet 6   Omega-3 Fatty Acids (FISH OIL) 1000 MG CAPS Take 2,000 mg by mouth daily.     omeprazole (PRILOSEC) 20 MG capsule Take 1 capsule (20 mg total) by mouth daily. (Patient taking differently: Take 20 mg by mouth daily before breakfast.) 90 capsule 1   OXYGEN Inhale 2 L into the lungs daily.     predniSONE (DELTASONE) 20 MG tablet Take 55m daily (Patient taking differently: Take 50 mg by mouth daily with breakfast. Take 658mdaily) 90 tablet 0   RELION PEN NEEDLE 31G/8MM 31G X 8 MM MISC USE WITH INSULIN PEN     rosuvastatin (CRESTOR) 20 MG tablet Take 1 tablet (20 mg total) by mouth daily. (Patient taking differently: Take 20 mg by mouth at bedtime.) 90 tablet 3   vitamin B-12 (CYANOCOBALAMIN) 1000 MCG tablet Take 1 tablet (1,000 mcg total) by mouth daily. (Patient taking differently: Take 1,000 mcg by mouth daily with breakfast.) 30 tablet 0   furosemide (LASIX) 20 MG tablet Take 20 mg by mouth daily as needed for fluid.     furosemide (LASIX) 40 MG tablet Take 1 tablet (40 mg total) by mouth daily. (Patient taking differently:  Take 20 mg by mouth daily.) 30 tablet 0   HYDROcodone bit-homatropine (HYCODAN) 5-1.5 MG/5ML syrup Take 5 mLs by mouth every 12 (twelve) hours as needed for cough. 70 mL 0   LANTUS SOLOSTAR 100 UNIT/ML Solostar Pen 30 Units.     levofloxacin (LEVAQUIN) 750 MG tablet Take 1 tablet (750 mg total) by mouth daily. 7 tablet 0   No facility-administered medications prior to visit.    Allergies  Allergen Reactions   Ace Inhibitors Other (See Comments)    = Slow heart rate   Hydrocodone Nausea And Vomiting   Hydrocodone-Acetaminophen Nausea And Vomiting   Nsaids Other (See Comments)    Kidney issues   Sulfamethoxazole Nausea And Vomiting   Sulfamethoxazole-Trimethoprim Nausea And Vomiting   Trimethoprim Other (See Comments)    Reaction not recalled    Review of Systems  Constitutional:  Negative for chills and fever.  HENT:  Positive for congestion. Negative for rhinorrhea and sore throat.   Respiratory:  Positive for cough and shortness of breath.   Cardiovascular:  Negative for chest pain and palpitations.  Gastrointestinal:  Negative for abdominal pain, constipation, diarrhea, nausea and vomiting.  Genitourinary:  Negative for dysuria and urgency.  Musculoskeletal:  Negative for arthralgias, back pain and myalgias.  Neurological:  Negative for dizziness and headaches.  Psychiatric/Behavioral:  Positive for confusion. Negative for dysphoric mood.  ;     Objective:    Physical Exam Vitals reviewed.  Constitutional:      Appearance: Normal appearance.  Cardiovascular:     Rate and Rhythm: Normal rate and regular rhythm.     Heart sounds: Normal heart sounds.  Pulmonary:     Effort: Pulmonary effort is normal.     Breath sounds: Normal breath sounds. No wheezing, rhonchi or rales.     Comments: ON oxygen 2-3 L daily. This is down from 8 L on which he was discharged. Neurological:     Mental Status: He is alert.  Psychiatric:        Mood and Affect: Mood normal.         Behavior: Behavior normal.    BP 110/68    Pulse 70    Temp (!) 96.6 F (35.9 C)  Resp 20    Ht _0  (1.651 m)    Wt 178 lb (80.7 kg)    SpO2 94%    BMI 29.62 kg/m  Wt Readings from Last 3 Encounters:  10/09/21 182 lb (82.6 kg)  10/07/21 178 lb (80.7 kg)  09/20/21 183 lb (83 kg)    Health Maintenance Due  Topic Date Due   TETANUS/TDAP  Never done   COVID-19 Vaccine (4 - Booster for Pfizer series) 07/31/2020    There are no preventive care reminders to display for this patient.   Lab Results  Component Value Date   TSH 2.830 04/04/2021   Lab Results  Component Value Date   WBC 16.5 (H) 10/07/2021   HGB 15.7 10/07/2021   HCT 44.5 10/07/2021   MCV 84 10/07/2021   PLT 192 10/07/2021   Lab Results  Component Value Date   NA 137 10/07/2021   K 5.2 10/07/2021   CO2 19 (L) 10/07/2021   GLUCOSE 216 (H) 10/07/2021   BUN 26 10/07/2021   CREATININE 1.25 10/07/2021   BILITOT 0.7 10/07/2021   ALKPHOS 85 10/07/2021   AST 19 10/07/2021   ALT 21 10/07/2021   PROT 6.2 10/07/2021   ALBUMIN 3.9 10/07/2021   CALCIUM 8.5 (L) 10/07/2021   ANIONGAP 5 08/23/2021   EGFR 57 (L) 10/07/2021   GFR 53.09 (L) 09/20/2021   Lab Results  Component Value Date   CHOL 158 08/07/2021   Lab Results  Component Value Date   HDL 30 (L) 08/07/2021   Lab Results  Component Value Date   LDLCALC 106 (H) 08/07/2021   Lab Results  Component Value Date   TRIG 122 08/07/2021   Lab Results  Component Value Date   CHOLHDL 5.3 (H) 08/07/2021   Lab Results  Component Value Date   HGBA1C 6.3 (H) 08/07/2021       Assessment & Plan:   Problem List Items Addressed This Visit       Cardiovascular and Mediastinum   Respiratory disorder concurrent with and due to microscopic polyangiitis ([place])    Continue to wean prednisone per pulmonology.         Endocrine   Type 2 diabetes mellitus with hyperglycemia, with long-term current use of insulin ([place]) - Primary    Start CGM. Freestyle  libre 3.  Increase lantus to 45 U daily in am  Sliding scale insulin: initial prescribed humalog, but novolog was preferred so changed rx.  Check insulin prior to each meal and give dose below.  100 - 150: 4 U. 151 - 200: + 2 U (6 U) 201 - 250: + 4 U (8 U) 251 - 300: + 6 U (10 U) 301 - 350: + 8 U (12 U) 350 - 400: + 10 U (14 U) > 400: + 12 U (16U)      Relevant Medications   Continuous Blood Gluc Sensor (FREESTYLE LIBRE 3 SENSOR) MISC   Other Relevant Orders   Comprehensive metabolic panel (Completed)     Other   Leukocytosis    Check cbc. Likely secondary to prednisone.       Relevant Orders   CBC with Differential/Platelet (Completed)   Meds ordered this encounter  Medications   Continuous Blood Gluc Sensor (FREESTYLE LIBRE 3 SENSOR) MISC    Sig: 1 each by Does not apply route every 14 (fourteen) days. Place 1 sensor on the skin every 14 days. Use to check glucose continuously    Dispense:  6 each  Refill:  0   DISCONTD: insulin lispro (HUMALOG KWIKPEN) 200 UNIT/ML KwikPen    Sig: SSI: up to 16 U before meals    Dispense:  9 mL    Refill:  2    Check sugar prior to each meal and give dose below.  100 - 150: 4 U. 151 - 200: + 2 U (6 U) 201 - 250: + 4 U (8 U) 251 - 300: + 6 U (10 U) 301 - 350: + 8 U (12 U) 350 - 400: + 10 U (14 U) > 400: + 12 U (16U)    Orders Placed This Encounter  Procedures   CBC with Differential/Platelet   Comprehensive metabolic panel    Total time spent on today's visit was greater than 30 minutes, including both face-to-face time and nonface-to-face time personally spent on review of chart (labs and imaging), discussing labs and goals, discussing further work-up, treatment options, referrals to specialist if needed, reviewing outside records of pertinent, answering patient's questions, and coordinating care.  Follow-up: Return in about 8 weeks (around 12/02/2021) for chronic fasting.  An After Visit Summary was printed and given to the  patient.  [name], MD [facility] [phone]

## 2021-10-07 NOTE — Patient Instructions (Addendum)
Start CGM Increase lantus to 45 U daily in am  Sliding scale insulin: Check insulin prior to each meal and give dose below.  100 - 150: 4 U. 151 - 200: + 2 U (6 U) 201 - 250: + 4 U (8 U) 251 - 300: + 6 U (10 U) 301 - 350: + 8 U (12 U) 350 - 400: + 10 U (14 U) > 400: + 12 U (16U)

## 2021-10-08 ENCOUNTER — Other Ambulatory Visit: Payer: Self-pay

## 2021-10-08 ENCOUNTER — Ambulatory Visit: Payer: Medicare HMO | Admitting: Primary Care

## 2021-10-08 LAB — CBC WITH DIFFERENTIAL/PLATELET
Basophils Absolute: 0.1 10*3/uL (ref 0.0–0.2)
Basos: 0 %
EOS (ABSOLUTE): 0.2 10*3/uL (ref 0.0–0.4)
Eos: 1 %
Hematocrit: 44.5 % (ref 37.5–51.0)
Hemoglobin: 15.7 g/dL (ref 13.0–17.7)
Immature Grans (Abs): 0.6 10*3/uL — ABNORMAL HIGH (ref 0.0–0.1)
Immature Granulocytes: 3 %
Lymphocytes Absolute: 1.5 10*3/uL (ref 0.7–3.1)
Lymphs: 9 %
MCH: 29.8 pg (ref 26.6–33.0)
MCHC: 35.3 g/dL (ref 31.5–35.7)
MCV: 84 fL (ref 79–97)
Monocytes Absolute: 0.5 10*3/uL (ref 0.1–0.9)
Monocytes: 3 %
Neutrophils Absolute: 13.7 10*3/uL — ABNORMAL HIGH (ref 1.4–7.0)
Neutrophils: 84 %
Platelets: 192 10*3/uL (ref 150–450)
RBC: 5.27 x10E6/uL (ref 4.14–5.80)
RDW: 14.9 % (ref 11.6–15.4)
WBC: 16.5 10*3/uL — ABNORMAL HIGH (ref 3.4–10.8)

## 2021-10-08 LAB — COMPREHENSIVE METABOLIC PANEL
ALT: 21 IU/L (ref 0–44)
AST: 19 IU/L (ref 0–40)
Albumin/Globulin Ratio: 1.7 (ref 1.2–2.2)
Albumin: 3.9 g/dL (ref 3.6–4.6)
Alkaline Phosphatase: 85 IU/L (ref 44–121)
BUN/Creatinine Ratio: 21 (ref 10–24)
BUN: 26 mg/dL (ref 8–27)
Bilirubin Total: 0.7 mg/dL (ref 0.0–1.2)
CO2: 19 mmol/L — ABNORMAL LOW (ref 20–29)
Calcium: 8.5 mg/dL — ABNORMAL LOW (ref 8.6–10.2)
Chloride: 98 mmol/L (ref 96–106)
Creatinine, Ser: 1.25 mg/dL (ref 0.76–1.27)
Globulin, Total: 2.3 g/dL (ref 1.5–4.5)
Glucose: 216 mg/dL — ABNORMAL HIGH (ref 70–99)
Potassium: 5.2 mmol/L (ref 3.5–5.2)
Sodium: 137 mmol/L (ref 134–144)
Total Protein: 6.2 g/dL (ref 6.0–8.5)
eGFR: 57 mL/min/{1.73_m2} — ABNORMAL LOW (ref >59)

## 2021-10-08 MED ORDER — HUMALOG KWIKPEN 200 UNIT/ML ~~LOC~~ SOPN: PEN_INJECTOR | SUBCUTANEOUS | 2 refills | Status: DC

## 2021-10-09 ENCOUNTER — Ambulatory Visit: Payer: Medicare HMO | Admitting: Cardiology

## 2021-10-09 ENCOUNTER — Encounter: Payer: Self-pay | Admitting: Pulmonary Disease

## 2021-10-09 ENCOUNTER — Ambulatory Visit: Payer: Medicare HMO | Admitting: Pulmonary Disease

## 2021-10-09 ENCOUNTER — Other Ambulatory Visit: Payer: Self-pay

## 2021-10-09 VITALS — BP 132/66 | HR 82 | Temp 97.5°F | Ht 65.0 in | Wt 182.0 lb

## 2021-10-09 DIAGNOSIS — J849 Interstitial pulmonary disease, unspecified: Secondary | ICD-10-CM

## 2021-10-09 DIAGNOSIS — U099 Post covid-19 condition, unspecified: Secondary | ICD-10-CM

## 2021-10-09 NOTE — Progress Notes (Signed)
Kerry Matthews    [id number]    04/15/1936  Primary Care Physician:[facility], Elnita Maxwell, MD  Referring Physician: [name], MD [address] [place],  [place] [address]  Chief complaint: Follow up for CTD interstitial lung disease, alpha-1 antitrypsin carrier  HPI: 86 year old with pulmonary fibrosis on CT chest. He has complains of joint stiffness, generalized myalgias, low-grade fevers starting July 2020.  The symptoms generally respond to prednisone.  Denies any renal symptoms, difficulty swallowing.  Ruled out for COVID- 19 multiple times.  He has seen Dr. Trudie Reed, Rheumatology. Serologies reviewed which show elevated p-ANCA, myeloperoxidase with elevated CRP, normal sed rate, elevated IgG4 subclass, repeat rheumatoid factor is negative. 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  Was initially on prednisone and eventually got started on Rituxan by Dr. Trudie Reed from rheumatology in late 2020 with tapering off of prednisone.  Symptoms have been stable while on Rituxan  He developed COVID-19 infection on 07/29/2021 which was treated as an outpatient with Molnupiravir.  However his situation deteriorated and was admitted to [facility] on 08/12/2021 to 08/25/2021. He was diagnosed with multifocal pneumonia, Acute Respiratory failure up to 15 L.  Treated with convalescent plasma, IV steroids which was transitioned to prednisone 60 mg.  Discharged on supplemental oxygen  Pets: Has a dog.  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 No mold, hot tub, Jacuzzi, down, humidifier Smoking history: Never smoker Travel history: No significant travel history Relevant family history: 2  sisters died of pulmonary fibrosis of unclear etiology.  1 of the sisters also had COPD  Interim history:  He was treated with antibiotics at last visit in January for elevated WBC count and chest x-ray with persistent bilateral opacities He is feeling much better today and states that dyspnea is improved Denies any cough, fevers, sputum production Continues on supplemental oxygen and prednisone at 50 mg  He reports that his blood sugars have been high and has primary care has placed him on insulin  Outpatient Encounter Medications as of 10/09/2021  Medication Sig   aspirin EC 81 MG tablet Take 81 mg by mouth daily. Swallow whole.   Coenzyme Q10 100 MG TABS Take 100 mg by mouth at bedtime.   Continuous Blood Gluc Sensor (FREESTYLE LIBRE 3 SENSOR) MISC 1 each by Does not apply route every 14 (fourteen) days. Place 1 sensor on the skin every 14 days. Use to check glucose continuously   furosemide (LASIX) 40 MG tablet Take 1 tablet (40 mg total) by mouth daily. (Patient taking differently: Take 20 mg by mouth daily.)   gabapentin (NEURONTIN) 400 MG capsule Take 400 mg by mouth 2 (two) times daily.   glucose blood (TRUE METRIX BLOOD GLUCOSE TEST) test strip E11.69 TEST BLOOD SUGAR THREE TIMES DAILY BEFORE MEALS   insulin lispro (HUMALOG KWIKPEN) 200 UNIT/ML KwikPen SSI: up to 16 U before meals   LANTUS SOLOSTAR 100 UNIT/ML Solostar Pen 30 Units.   Multiple Vitamins-Minerals (PRESERVISION AREDS 2) CAPS Take 1 capsule by mouth 2 (two) times daily after a meal.   nitroGLYCERIN (NITROSTAT) 0.4 MG SL tablet Place 1 tablet (0.4 mg total) under the tongue every 5 (five) minutes as needed for chest pain.   Omega-3  Fatty Acids (FISH OIL) 1000 MG CAPS Take 2,000 mg by mouth daily.   omeprazole (PRILOSEC) 20 MG capsule Take 1 capsule (20 mg total) by mouth daily. (Patient taking differently: Take 20 mg by mouth daily before breakfast.)   OXYGEN Inhale 2 L into the lungs daily.   predniSONE (DELTASONE) 20 MG  tablet Take 60mg  daily (Patient taking differently: Take 50 mg by mouth daily with breakfast. Take 60mg  daily)   RELION PEN NEEDLE 31G/8MM 31G X 8 MM MISC USE WITH INSULIN PEN   rosuvastatin (CRESTOR) 20 MG tablet Take 1 tablet (20 mg total) by mouth daily. (Patient taking differently: Take 20 mg by mouth at bedtime.)   vitamin B-12 (CYANOCOBALAMIN) 1000 MCG tablet Take 1 tablet (1,000 mcg total) by mouth daily. (Patient taking differently: Take 1,000 mcg by mouth daily with breakfast.)   Insulin Pen Needle 29G X 5MM MISC Use with insulin pen   No facility-administered encounter medications on file as of 10/09/2021.   Physical Exam: Blood pressure 132/66, pulse 82, temperature (!) 97.5 F (36.4 C), temperature source Oral, height 5\' 5"  (1.651 m), weight 182 lb (82.6 kg), SpO2 98 %. Gen:      No acute distress HEENT:  EOMI, sclera anicteric Neck:     No masses; no thyromegaly Lungs:    Clear to auscultation bilaterally; normal respiratory effort CV:         Regular rate and rhythm; no murmurs Abd:      + bowel sounds; soft, non-tender; no palpable masses, no distension Ext:    No edema; adequate peripheral perfusion Skin:      Warm and dry; no rash Neuro: alert and oriented x 3 Psych: normal mood and affect   Data Reviewed: Imaging: CT high-resolution 04/19/2019- patchy groundglass attenuation, septal thickening with reticulation and mild bronchiectasis with basal gradient.  No definitive honeycombing Probable UIP.   CT high-resolution 10/14/2019-stable findings of interstitial lung disease, pulmonary fibrosis and probable UIP pattern  CTA 08/12/2021-no pulmonary embolism, patchy multifocal airspace disease  CTA 08/18/2021-no PE, extensive bilateral homogeneous groundglass opacities with septal thickening, fibrotic changes at the bases.  Chest x-ray 09/03/2021-patchy peripheral interstitial abnormalities, cardiomegaly. I have reviewed the images personally  PFTs: 05/03/2019 FVC 3.22  [92%], FEV1 2.57 [104%], F/F 80, TLC 4.85 [95%], DLCO 18.93 [85%] Minimal restriction  Labs: 04/02/2019- p-ANCA 1:160, c-ANCA negative, MPO>100, rheumatoid factor 95 CCP negative, Sjogren's antibody negative, SCL 70- negative Jo 1-negative, aldolase 6.3, CK 31  Alpha-1 antitrypsin 04/12/2019- 124, PI MZ  Assessment:  Recent hospitalization for COVID-19, multifocal bacterial pneumonia He is making slow recovery posthospitalization.  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  He is currently on prednisone at 50 mg.  He will need a slow taper over the next couple of months.  Has elevated blood sugars that is being managed by his primary care  We will reduce prednisone to 40 mg per day today and then to 30 mg in beginning of March.  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 Will need follow-up high-res CT and PFTs later this year to evaluate underlying interstitial lung disease and pulmonary fibrosis  Alpha-1 antitrypsin carrier status His A1AT levels are normal with no obstruction on pulmonary function test.  LFTs are normal Continue monitoring.  No need for treatment.  Plan/Recommendations:  Slow prednisone taper as above Lasix 20 mg a day  [name] MD [facility] 10/09/2021, 10:33 AM  CC: [name], MD

## 2021-10-09 NOTE — Patient Instructions (Addendum)
I am glad you are starting to feel better We will reduce the prednisone to 40 mg a day today and then to 30 mg a day on first week of March.  Please hold prednisone at that dose until you can be seen back in clinic in April We will qualify you for portable concentrator today Continue supplemental oxygen Return to clinic as scheduled in April

## 2021-10-09 NOTE — Addendum Note (Signed)
Addended by: Elton Sin on: 10/09/2021 11:23 AM   Modules accepted: Orders

## 2021-10-10 ENCOUNTER — Telehealth: Payer: Self-pay

## 2021-10-10 ENCOUNTER — Other Ambulatory Visit: Payer: Self-pay

## 2021-10-10 ENCOUNTER — Other Ambulatory Visit: Payer: Self-pay | Admitting: Family Medicine

## 2021-10-10 MED ORDER — NOVOLOG FLEXPEN 100 UNIT/ML ~~LOC~~ SOPN: PEN_INJECTOR | SUBCUTANEOUS | 11 refills | Status: DC

## 2021-10-10 MED ORDER — LANTUS SOLOSTAR 100 UNIT/ML ~~LOC~~ SOPN: 20.0000 [IU] | PEN_INJECTOR | Freq: Two times a day (BID) | SUBCUTANEOUS | 1 refills | Status: DC

## 2021-10-10 MED ORDER — FUROSEMIDE 20 MG PO TABS: 20.0000 mg | ORAL_TABLET | Freq: Every day | ORAL | 1 refills | Status: DC | PRN

## 2021-10-10 NOTE — Telephone Encounter (Signed)
Received a MyChart message asking about the furosemide 20mg . He forgot to ask at the last visit if he was supposed to remain on the furosemide.   Dr. Vaughan Browner, can you please advise? Thanks!

## 2021-10-10 NOTE — Telephone Encounter (Signed)
Mrs. Dull called to report that Mr. Barbuto's blood sugar alarmed around 4 am and was 65.  He had some juice and it went up to 88 (107 finger stick).  His blood sugar during the day has been 276 and 232.  His pulmonologist did drop his prednisone to 40 mg daily.  Dr. Tobie Poet advised Lantus 20 units twice daily and change the humalog to novolog for coverage.

## 2021-10-13 ENCOUNTER — Encounter: Payer: Self-pay | Admitting: Family Medicine

## 2021-10-13 NOTE — Assessment & Plan Note (Signed)
Start CGM. Freestyle libre 3.  Increase lantus to 45 U daily in am  Sliding scale insulin: initial prescribed humalog, but novolog was preferred so changed rx.  Check insulin prior to each meal and give dose below.  100 - 150: 4 U. 151 - 200: + 2 U (6 U) 201 - 250: + 4 U (8 U) 251 - 300: + 6 U (10 U) 301 - 350: + 8 U (12 U) 350 - 400: + 10 U (14 U) > 400: + 12 U (16U)

## 2021-10-13 NOTE — Assessment & Plan Note (Signed)
Continue to wean prednisone per pulmonology.

## 2021-10-13 NOTE — Assessment & Plan Note (Signed)
Check cbc. Likely secondary to prednisone.

## 2021-10-14 ENCOUNTER — Telehealth: Payer: Self-pay

## 2021-10-14 ENCOUNTER — Other Ambulatory Visit: Payer: Self-pay

## 2021-10-14 ENCOUNTER — Other Ambulatory Visit: Payer: Self-pay | Admitting: *Deleted

## 2021-10-14 MED ORDER — RELION PEN NEEDLES 31G X 8 MM MISC: 2 refills | Status: DC

## 2021-10-14 MED ORDER — PREDNISONE 10 MG PO TABS: ORAL_TABLET | ORAL | 0 refills | Status: DC

## 2021-10-14 NOTE — Telephone Encounter (Signed)
[name] called with questions about Kerry Matthews's blood sugars.  His continuous glucose meter alarmed at 4:10 this morning and his blood sugar was 63.  He has been taking the long acting insulin twice daily, in the morning and at 3 pm.  She has used the sliding scale coverage but has questions about the amount that should be given in a 24 hour period.  Dr. Tobie Poet advised that they drop the dose of long acting insulin to 20 in the morning and 10 in the pm.  She was advised to continue the sliding scale coverage with each meal.  She voiced understanding.

## 2021-10-14 NOTE — Telephone Encounter (Signed)
Home Health Verbal Orders - Caller/Agency: [facility] Requesting OT/PT/Skilled Nursing/Social Work/Speech Therapy: OT    Frequency: once a week for seven weeks.   Verbal orders given.  Royce Macadamia, [place] 10/14/21 1:36 PM

## 2021-10-18 ENCOUNTER — Other Ambulatory Visit (HOSPITAL_COMMUNITY): Payer: Self-pay

## 2021-10-18 ENCOUNTER — Other Ambulatory Visit: Payer: Self-pay | Admitting: Family Medicine

## 2021-10-18 DIAGNOSIS — Z794 Long term (current) use of insulin: Secondary | ICD-10-CM

## 2021-10-18 DIAGNOSIS — E1165 Type 2 diabetes mellitus with hyperglycemia: Secondary | ICD-10-CM

## 2021-10-18 MED ORDER — FREESTYLE LIBRE 3 SENSOR MISC
1.0000 | 0 refills | Status: DC
  Filled 2021-10-18: qty 6, 84d supply, fill #0

## 2021-10-25 ENCOUNTER — Other Ambulatory Visit: Payer: Self-pay

## 2021-10-25 MED ORDER — RELION PEN NEEDLES 31G X 8 MM MISC: 2 refills | Status: DC

## 2021-10-28 ENCOUNTER — Other Ambulatory Visit: Payer: Self-pay

## 2021-10-28 ENCOUNTER — Ambulatory Visit (INDEPENDENT_AMBULATORY_CARE_PROVIDER_SITE_OTHER): Payer: Medicare HMO

## 2021-10-28 DIAGNOSIS — E1165 Type 2 diabetes mellitus with hyperglycemia: Secondary | ICD-10-CM

## 2021-10-28 DIAGNOSIS — E782 Mixed hyperlipidemia: Secondary | ICD-10-CM

## 2021-10-28 DIAGNOSIS — I119 Hypertensive heart disease without heart failure: Secondary | ICD-10-CM

## 2021-10-28 NOTE — Patient Instructions (Signed)
Visit Information ? ? Goals Addressed   ?None ?  ? ?Patient Care Plan: [place]  ?  ? ?Problem Identified: [id number]   ?Priority: High  ?Onset Date: 12/06/2020  ?  ? ?Long-Range Goal: disease state management   ?Start Date: 12/06/2020  ?Expected End Date: 12/06/2021  ?Recent Progress: On track  ?Priority: High  ?Note:   ? ? ?Current Barriers:  ?Unable to maintain control of urinary symptoms. Seeing urology.  ? ?Pharmacist Clinical Goal(s):  ?Patient will maintain control of urological symptoms as evidenced by improvement in symptoms.   through collaboration with PharmD and provider.  ? ? ?Interventions: ?1:1 collaboration with [name], MD regarding development and update of comprehensive plan of care as evidenced by provider attestation and co-signature ?Inter-disciplinary care team collaboration (see longitudinal plan of care) ?Comprehensive medication review performed; medication list updated in electronic medical record ? ?Hypertension (BP goal <130/80) ?BP Readings from Last 3 Encounters:  ?10/09/21 132/66  ?10/07/21 110/68  ?09/20/21 (!) 138/56  ?-Controlled ?-Current treatment: ?Diet ?-Medications previously tried: losartan  ?-Current home readings: well controlled per wife.  ?-Current dietary habits: watching salt.  ?-Current exercise habits: knee is limiting but will have lots of rehab after surgery.  ?-Denies hypotensive/hypertensive symptoms ?-Educated on BP goals and benefits of medications for prevention of heart attack, stroke and kidney damage; ?Daily salt intake goal < 2300 mg; ?Exercise goal of 150 minutes per week; ?-Counseled to monitor BP at home weekly, document, and provide log at future appointments ?-Counseled on diet and exercise extensively ?Recommended to continue current medication ? ?Hyperlipidemia: (LDL goal < 55) ?-Uncontrolled ?-Current treatment: ?Fish oil 1000 mg bid Appropriate, Effective, Safe, Accessible ?-Medications previously tried: atorvastatin/Rosuvastatin  (Elevated LFT) ?-Current dietary patterns: working on healthy diet ?-Current exercise habits: limited due to knee pain. Will begin rehab after surgery.  ?-Educated on Cholesterol goals;  ?Benefits of statin for ASCVD risk reduction; ?Importance of limiting foods high in cholesterol; ?Exercise goal of 150 minutes per week; ?-Counseled on diet and exercise extensively ? ?Diabetes (A1c goal <7%) ?Lab Results  ?Component Value Date  ? HGBA1C 6.3 (H) 08/07/2021  ? HGBA1C 6.7 (H) 04/04/2021  ? HGBA1C 6.3 (H) 12/17/2020  ? ?Lab Results  ?Component Value Date  ? MICROALBUR 10 04/11/2021  ? LDLCALC 106 (H) 08/07/2021  ? CREATININE 1.25 10/07/2021  ? ?Lab Results  ?Component Value Date  ? NA 137 10/07/2021  ? K 5.2 10/07/2021  ? CREATININE 1.25 10/07/2021  ? EGFR 57 (L) 10/07/2021  ? GFRNONAA >60 08/23/2021  ? GLUCOSE 216 (H) 10/07/2021  ? ?Lab Results  ?Component Value Date  ? WBC 16.5 (H) 10/07/2021  ? HGB 15.7 10/07/2021  ? HCT 44.5 10/07/2021  ? MCV 84 10/07/2021  ? PLT 192 10/07/2021  ?-Controlled ?-Current medications: ?True Metrix test strips and lancets ?Diet/lifestyle  ?Aspart SS Appropriate, Effective, Safe, Accessible ?Lantus 20 units Appropriate, Effective, Safe, Accessible ?Prednisone 53m QD for Pulmonary ?-Medications previously tried: none reported  ?-Current home glucose readings ?fasting glucose: 90-120 mg/dl ?post prandial glucose: n/a ?-Denies hypoglycemic/hyperglycemic symptoms ?-Current meal patterns:  ?Heart-healthy and lower sugar diet. Likes meat, non-starchy vegetables and apples.  ?-Current exercise: limited due to knee pain.  ?-Educated on A1c and blood sugar goals; ?Complications of diabetes including kidney damage, retinal damage, and cardiovascular disease; ?Exercise goal of 150 minutes per week; ?Carbohydrate counting and/or plate method ?-Counseled to check feet daily and get yearly eye exams ?-Counseled on diet and exercise extensively ?March 2023: They  are frustrated they have to poke him  4x/day but they are resigned to "stick it out" until Steroid is tapered off ? ?Interstitial Lung Disease (Goal: improve breathing) ?-Managed by Pulm ?-Started on Rituxan by Dr. Trudie Reed in late 2020 and tapered off Prednisone. Was very stable until 07/29/21 when he contracted Covid, deteriorated, and was hospitalized [facility] from 08/12/21-08/25/21 ?-Not ideally controlled ?-Current treatment  ?Prednisone 35m (Was on 654mand is tapering 1047month) Appropriate, Effective, Safe, Accessible ?Lasix 50m1m Appropriate, Effective, Safe, Accessible ?-Medications previously tried: Rituxan (Held after CoviDarden RestaurantsMarch 2023: Has next appt with Pulm in April 2023, hoping to go down to 50mg66mdnisone then. Told them to call because his SS will most likely be too strong at that point ? ? ? ?Patient Goals/Self-Care Activities ?Patient will:  ?- take medications as prescribed ?focus on medication adherence by using pill box ?check glucose 2-3 times weekly, document, and provide at future appointments ?check blood pressure weekly, document, and provide at future appointments ?target a minimum of 150 minutes of moderate intensity exercise weekly ? ?Follow Up Plan: Telephone follow up appointment with care management team member scheduled for: 04/2022 ? ?Natha[place] Const[employer].D. - [phone] ? ?  ? ? ?Mr. FogleCastengiven information about Chronic Care Management services today including:  ?CCM service includes personalized support from designated clinical staff supervised by his physician, including individualized plan of care and coordination with other care providers ?24/7 contact phone numbers for assistance for urgent and routine care needs. ?Standard insurance, coinsurance, copays and deductibles apply for chronic care management only during months in which we provide at least 20 minutes of these services. Most insurances cover these services at 100%, however patients may be responsible for any copay, coinsurance and/or  deductible if applicable. This service may help you avoid the need for more expensive face-to-face services. ?Only one practitioner may furnish and bill the service in a calendar month. ?The patient may stop CCM services at any time (effective at the end of the month) by phone call to the office staff. ? ?Patient agreed to services and verbal consent obtained.  ? ?The patient verbalized understanding of instructions, educational materials, and care plan provided today and declined offer to receive copy of patient instructions, educational materials, and care plan.  ?The pharmacy team will reach out to the patient again over the next 90 days.  ? ?NathaLane Hacker [address]

## 2021-10-28 NOTE — Progress Notes (Addendum)
Chronic Care Management Pharmacy Note  10/29/2021 Name:  Barton Want MRN:  [id number] DOB:  [birth year]  Subjective: Kerry Matthews is an 86 y.o. year old male who is a primary patient of Cox, Kirsten, MD.  The CCM team was consulted for assistance with disease management and care coordination needs.    Summary:  Patient and wife married in 1961 and have lived in same house since. Loves to work in the garden but hasn't since he was hospitalized in December for Wanda. [place]. He didn't graduate HS, made it to the 7th grade and had to drop-out to work. Has 2 children and 4 grand daughters. Has a dog. 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    Plan Updates:  Patient is doing well  Engaged with patient by telephone for follow up visit in response to provider referral for pharmacy case management and/or care coordination services.   Consent to Services:  The patient was given information about Chronic Care Management services, agreed to services, and gave verbal consent prior to initiation of services.  Please see initial visit note for detailed documentation.   Patient Care Team: [name], MD as PCP - General (Family Medicine) [name], MD as Consulting Physician (Pulmonary Disease) Gavin Pound, MD as Consulting Physician (Rheumatology) Revankar, Reita Cliche, MD as Consulting Physician (Cardiology) Lane Hacker, [facility] (Pharmacist)   Objective:  Lab Results  Component Value Date   CREATININE 1.25 10/07/2021   BUN 26 10/07/2021   GFR 53.09 (L) 09/20/2021   GFRNONAA >60 08/23/2021   GFRAA 55 (L) 07/26/2020   NA 137 10/07/2021   K 5.2 10/07/2021   CALCIUM 8.5 (L) 10/07/2021   CO2 19 (L) 10/07/2021   GLUCOSE 216 (H) 10/07/2021    Lab Results  Component Value Date/Time   HGBA1C 6.3 (H) 08/07/2021 10:31 AM   HGBA1C 6.7 (H) 04/04/2021 08:33 AM   GFR 53.09 (L) 09/20/2021 11:21 AM    MICROALBUR 10 04/11/2021 11:54 AM   MICROALBUR 10 03/06/2020 03:07 PM    Last diabetic Eye exam:  Lab Results  Component Value Date/Time   HMDIABEYEEXA No Retinopathy 04/02/2021 12:00 AM    Last diabetic Foot exam: No results found for: HMDIABFOOTEX   Lab Results  Component Value Date   CHOL 158 08/07/2021   HDL 30 (L) 08/07/2021   LDLCALC 106 (H) 08/07/2021   TRIG 122 08/07/2021   CHOLHDL 5.3 (H) 08/07/2021    Hepatic Function Latest Ref Rng & Units 10/07/2021 09/20/2021 09/02/2021  Total Protein 6.0 - 8.5 g/dL 6.2 6.5 5.6(L)  Albumin 3.6 - 4.6 g/dL 3.9 3.6 3.0(L)  AST 0 - 40 IU/L _0 ALT 0 - 44 IU/L 21 28 36  Alk Phosphatase 44 - 121 IU/L 85 94 142(H)  Total Bilirubin 0.0 - 1.2 mg/dL 0.7 0.7 0.7  Bilirubin, Direct 0.00 - 0.40 mg/dL - - -    Lab Results  Component Value Date/Time   TSH 2.830 04/04/2021 08:33 AM   TSH 2.690 12/01/2019 09:04 AM    CBC Latest Ref Rng & Units 10/07/2021 09/20/2021 09/02/2021  WBC 3.4 - 10.8 x10E3/uL 16.5(H) 20.0 Repeated and verified X2.([place]) 13.2(H)  Hemoglobin 13.0 - 17.7 g/dL 15.7 14.3 12.0(L)  Hematocrit 37.5 - 51.0 % 44.5 43.4 31.7(L)  Platelets 150 - 450 x10E3/uL 192 217.0 210    Lab Results  Component Value Date/Time   VD25OH  24.1 (L) 04/04/2021 08:33 AM    Clinical ASCVD: No  The ASCVD Risk score (Arnett DK, et al., 2019) failed to calculate for the following reasons:   The 2019 ASCVD risk score is only valid for ages 72 to 51    Depression screen PHQ 2/9 10/07/2021 08/06/2021 05/09/2021  Decreased Interest 0 0 0  Down, Depressed, Hopeless 0 0 0  PHQ - 2 Score 0 0 0     Social History   Tobacco Use  Smoking Status Never  Smokeless Tobacco Never   BP Readings from Last 3 Encounters:  10/09/21 132/66  10/07/21 110/68  09/20/21 (!) 138/56   Pulse Readings from Last 3 Encounters:  10/09/21 82  10/07/21 70  09/20/21 69   Wt Readings from Last 3 Encounters:  10/09/21 182 lb (82.6 kg)  10/07/21 178 lb (80.7 kg)   09/20/21 183 lb (83 kg)   BMI Readings from Last 3 Encounters:  10/09/21 30.29 kg/m  10/07/21 29.62 kg/m  09/20/21 28.66 kg/m    Assessment/Interventions: Review of patient past medical history, allergies, medications, health status, including review of consultants reports, laboratory and other test data, was performed as part of comprehensive evaluation and provision of chronic care management services.   SDOH:  (Social Determinants of Health) assessments and interventions performed: Yes SDOH Interventions    Flowsheet Row Most Recent Value  SDOH Interventions   Financial Strain Interventions Intervention Not Indicated  Transportation Interventions Intervention Not Indicated      SDOH Screenings   Alcohol Screen: Low Risk    Last Alcohol Screening Score (AUDIT): 0  Depression (PHQ2-9): Low Risk    PHQ-2 Score: 0  Financial Resource Strain: Low Risk    Difficulty of Paying Living Expenses: Not hard at all  Food Insecurity: Not on file  Housing: Not on file  Physical Activity: Not on file  Social Connections: Not on file  Stress: Not on file  Tobacco Use: Low Risk    Smoking Tobacco Use: Never   Smokeless Tobacco Use: Never   Passive Exposure: Not on file  Transportation Needs: No Transportation Needs   Lack of Transportation (Medical): No   Lack of Transportation (Non-Medical): No    CCM Care Plan  Allergies  Allergen Reactions   Ace Inhibitors Other (See Comments)    = Slow heart rate   Hydrocodone Nausea And Vomiting   Hydrocodone-Acetaminophen Nausea And Vomiting   Nsaids Other (See Comments)    Kidney issues   Sulfamethoxazole Nausea And Vomiting   Sulfamethoxazole-Trimethoprim Nausea And Vomiting   Trimethoprim Other (See Comments)    Reaction not recalled    Medications Reviewed Today     Reviewed by Lane Hacker, [facility] (Pharmacist) on 10/28/21 at 1324  Med List Status: <None>   Medication Order Taking? Sig Documenting Provider Last Dose  Status Informant  aspirin EC 81 MG tablet [id number]  Take 81 mg by mouth daily. Swallow whole. [provider]  Active   Coenzyme Q10 100 MG TABS [id number]  Take 100 mg by mouth at bedtime. [provider]  Active Spouse/Significant Other  Continuous Blood Gluc Sensor (FREESTYLE LIBRE 3 SENSOR) MISC [id number]  Place 1 sensor on the skin every 14 days. Use to check glucose continuously Cox, Elnita Maxwell, MD  Active   furosemide (LASIX) 20 MG tablet [id number]  Take 1 tablet (20 mg total) by mouth daily as needed. Cox, Kirsten, MD  Active   gabapentin (NEURONTIN) 400 MG capsule [id number]  Take 400 mg by  mouth 2 (two) times daily. [provider]  Active Spouse/Significant Other  glucose blood (TRUE METRIX BLOOD GLUCOSE TEST) test strip [id number]  E11.69 TEST BLOOD SUGAR THREE TIMES DAILY BEFORE MEALS Cox, Kirsten, MD  Active Spouse/Significant Other  insulin aspart (NOVOLOG FLEXPEN) 100 UNIT/ML FlexPen [id number]  Up to 16 U per day Check sugar prior to each meal and give dose below. 100 - 150: 4 U. 151 - 200: + 2 U (6 U) 201 - 250: + 4 U (8 U) 251 - 300: + 6 U (10 U) 301 - 350: + 8 U (12 U) 350 - 400: + 10 U (14 U) > 400: + 12 U (16U) Cox, Kirsten, MD  Active   LANTUS SOLOSTAR 100 UNIT/ML Solostar Pen [id number]  Inject 20 Units into the skin 2 (two) times daily. Cox, Kirsten, MD  Active   Multiple Vitamins-Minerals (PRESERVISION AREDS 2) CAPS [id number]  Take 1 capsule by mouth 2 (two) times daily after a meal. [provider]  Active Spouse/Significant Other  nitroGLYCERIN (NITROSTAT) 0.4 MG SL tablet [id number]  Place 1 tablet (0.4 mg total) under the tongue every 5 (five) minutes as needed for chest pain. Revankar, Reita Cliche, MD  Active Spouse/Significant Other  Omega-3 Fatty Acids (FISH OIL) 1000 MG CAPS [id number]  Take 2,000 mg by mouth daily. [provider]  Active Spouse/Significant Other  omeprazole (PRILOSEC) 20 MG capsule [id number]  Take 1 capsule (20 mg total)  by mouth daily.  Patient taking differently: Take 20 mg by mouth daily before breakfast.   [name], MD  Active Spouse/Significant Other  OXYGEN [id number]  Inhale 2 L into the lungs daily. [provider]  Active   predniSONE (DELTASONE) 10 MG tablet [id number]  Take 75m daily starting March MMarshell Garfinkel MD  Active   RELION PEN NEEDLE 31G/8MM 31G X 8 MM MISC [id number] USE WITH INSULIN PEN Cox, Kirsten, MD  Active   rosuvastatin (CRESTOR) 20 MG tablet [id number] Take 1 tablet (20 mg total) by mouth daily.  Patient taking differently: Take 20 mg by mouth at bedtime.   Cox, Kirsten, MD Taking Active Spouse/Significant Other  vitamin B-12 (CYANOCOBALAMIN) 1000 MCG tablet [id number] Take 1 tablet (1,000 mcg total) by mouth daily.  Patient taking differently: Take 1,000 mcg by mouth daily with breakfast.   VGeradine Girt DO  Active Spouse/Significant Other            Patient Active Problem List   Diagnosis Date Noted   Respiratory disorder concurrent with and due to microscopic polyangiitis ([place] 08/12/2021   Hyponatremia 08/12/2021   Mixed diabetic hyperlipidemia associated with type 2 diabetes mellitus ([place] 08/12/2021   Diabetic polyneuropathy associated with type 2 diabetes mellitus ([facility] 08/12/2021   Chronic kidney disease, stage 3a ([place] 08/12/2021   Type 2 diabetes mellitus with hyperglycemia, with long-term current use of insulin ([facility] 04/23/2021   History of kidney stones 03/29/2021   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[place] 03/29/2021   Microscopic polyangiitis ([place] 03/29/2021   Rheumatoid factor positive 03/29/2021   Vasculitis ([place] 03/29/2021   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/17/2020   Hematuria 09/07/2020   Proteinuria 09/07/2020   Renal insufficiency 09/06/2020   Coronary artery disease    GAD (generalized anxiety disorder)    Osteoarthritis    Other emphysema ([place]    Primary insomnia    Skin cancer    UC (ulcerative colitis) ([place]     UTI (urinary tract infection)    Medicare  annual wellness visit, subsequent 08/09/2020   Leukocytosis 03/08/2020   Peripheral polyneuropathy 01/30/2020   Body mass index (BMI) 29.0-29.9, adult 01/05/2020   Acute pain of left knee 11/30/2019   Alpha-1-antitrypsin deficiency carrier 05/04/2019   Sepsis ([place]) 04/19/2019   Elevated rheumatoid factor 04/12/2019   Interstitial pulmonary disease ([place]) 04/12/2019   Abnormal ANCA test 04/12/2019   Abnormal findings on diagnostic imaging of lung 04/12/2019   Polymyositis ([place])    Low vitamin B12 level 04/03/2019   Abnormal CT of the chest    Hypertensive heart disease without heart failure 05/03/2018   Mixed hyperlipidemia 05/03/2018   Coronary artery disease involving native coronary artery of native heart without angina pectoris 05/03/2018   GERD without esophagitis 05/03/2018   Trigger finger 05/03/2018   Bradycardia 05/03/2018   CKD (chronic kidney disease) 05/03/2018   Insomnia 05/03/2018    Immunization History  Administered Date(s) Administered   PFIZER(Purple Top)SARS-COV-2 Vaccination 10/17/2019, 11/07/2019, 06/05/2020    Conditions to be addressed/monitored:  Hypertension, Hyperlipidemia and Diabetes  Care Plan : [place]  Updates made by Lane Hacker, [place] since 10/29/2021 12:00 AM     Problem: htn,hld,dm   Priority: High  Onset Date: 12/06/2020     Long-Range Goal: disease state management   Start Date: 12/06/2020  Expected End Date: 12/06/2021  Recent Progress: On track  Priority: High  Note:     Current Barriers:  Unable to maintain control of urinary symptoms. Seeing urology.   Pharmacist Clinical Goal(s):  Patient will maintain control of urological symptoms as evidenced by improvement in symptoms.   through collaboration with PharmD and provider.    Interventions: 1:1 collaboration with [name], MD regarding development and update of comprehensive plan of care as evidenced by  provider attestation and co-signature Inter-disciplinary care team collaboration (see longitudinal plan of care) Comprehensive medication review performed; medication list updated in electronic medical record  Hypertension (BP goal <130/80) BP Readings from Last 3 Encounters:  10/09/21 132/66  10/07/21 110/68  09/20/21 (!) 138/56  -Controlled -Current treatment: Diet -Medications previously tried: losartan  -Current home readings: well controlled per wife.  -Current dietary habits: watching salt.  -Current exercise habits: knee is limiting but will have lots of rehab after surgery.  -Denies hypotensive/hypertensive symptoms -Educated on BP goals and benefits of medications for prevention of heart attack, stroke and kidney damage; Daily salt intake goal < 2300 mg; Exercise goal of 150 minutes per week; -Counseled to monitor BP at home weekly, document, and provide log at future appointments -Counseled on diet and exercise extensively Recommended to continue current medication  Hyperlipidemia: (LDL goal < 55) The ASCVD Risk score (Arnett DK, et al., 2019) failed to calculate for the following reasons:   The 2019 ASCVD risk score is only valid for ages 63 to 21 Lab Results  Component Value Date   CHOL 158 08/07/2021   CHOL 254 (H) 04/04/2021   CHOL 168 04/04/2020   Lab Results  Component Value Date   HDL 30 (L) 08/07/2021   HDL 36 (L) 04/04/2021   HDL 43 04/04/2020   Lab Results  Component Value Date   LDLCALC 106 (H) 08/07/2021   LDLCALC 194 (H) 04/04/2021   LDLCALC 111 (H) 04/04/2020   Lab Results  Component Value Date   TRIG 122 08/07/2021   TRIG 129 04/04/2021   TRIG 76 04/04/2020   Lab Results  Component Value Date   CHOLHDL 5.3 (H) 08/07/2021   CHOLHDL 7.1 (H)  04/04/2021   CHOLHDL 3.9 04/04/2020  No results found for: LDLDIRECT -Uncontrolled -Current treatment: Fish oil 1000 mg bid Appropriate, Effective, Safe, Accessible -Medications previously tried:  atorvastatin/Rosuvastatin (Elevated LFT) -Current dietary patterns: working on healthy diet -Current exercise habits: limited due to knee pain. Will begin rehab after surgery.  -Educated on Cholesterol goals;  Benefits of statin for ASCVD risk reduction; Importance of limiting foods high in cholesterol; Exercise goal of 150 minutes per week; -Counseled on diet and exercise extensively March 2023: Due to patient's age, Fish Oil is not needed but they have been taking it for years and would like to maintain therapy  Diabetes (A1c goal <7%) Lab Results  Component Value Date   HGBA1C 6.3 (H) 08/07/2021   HGBA1C 6.7 (H) 04/04/2021   HGBA1C 6.3 (H) 12/17/2020   Lab Results  Component Value Date   MICROALBUR 10 04/11/2021   LDLCALC 106 (H) 08/07/2021   CREATININE 1.25 10/07/2021   Lab Results  Component Value Date   NA 137 10/07/2021   K 5.2 10/07/2021   CREATININE 1.25 10/07/2021   EGFR 57 (L) 10/07/2021   GFRNONAA >60 08/23/2021   GLUCOSE 216 (H) 10/07/2021   Lab Results  Component Value Date   WBC 16.5 (H) 10/07/2021   HGB 15.7 10/07/2021   HCT 44.5 10/07/2021   MCV 84 10/07/2021   PLT 192 10/07/2021  -Controlled -Current medications: True Metrix test strips and lancets Diet/lifestyle  Aspart SS Appropriate, Effective, Safe, Accessible Lantus 20 units Appropriate, Effective, Safe, Accessible -Medications previously tried: none reported  -Current home glucose readings fasting glucose: 90-120 mg/dl post prandial glucose: n/a -Denies hypoglycemic/hyperglycemic symptoms -Current meal patterns:  Heart-healthy and lower sugar diet. Likes meat, non-starchy vegetables and apples.  -Current exercise: limited due to knee pain.  -Educated on A1c and blood sugar goals; Complications of diabetes including kidney damage, retinal damage, and cardiovascular disease; Exercise goal of 150 minutes per week; Carbohydrate counting and/or plate method -Counseled to check feet daily and  get yearly eye exams -Counseled on diet and exercise extensively March 2023: They are frustrated they have to inject insulin 4x/day but they are resigned to "stick it out" until Steroid is tapered off. Hoping to go down to Prednisone 66m in April, counseled them to call when this happens so his sugars don't drop then  Interstitial Lung Disease (Goal: improve breathing) -Managed by Pulm -Started on Rituxan by Dr. HTrudie Reedin late 2020 and tapered off Prednisone. Was very stable until 07/29/21 when he contracted Covid, deteriorated, and was hospitalized [facility] from 08/12/21-08/25/21 -Not ideally controlled -Current treatment  Prednisone 34m(Was on 6031mnd is tapering 13m55mnth) Appropriate, Effective, Safe, Accessible Lasix 20mg1mAppropriate, Effective, Safe, Accessible -Medications previously tried: Rituxan (Held after Covid)  March 2023: Has next appt with Pulm in April 2023, hoping to go down to 20mg 21mnisone then.     Patient Goals/Self-Care Activities Patient will:  - take medications as prescribed focus on medication adherence by using pill box check glucose 2-3 times weekly, document, and provide at future appointments check blood pressure weekly, document, and provide at future appointments target a minimum of 150 minutes of moderate intensity exercise weekly  Follow Up Plan: Telephone follow up appointment with care management team member scheduled for: 04/2022  NathanArizona Constablem.D. - [phone]       Medication Assistance: None required.  Patient affirms current coverage meets needs.  Patient's preferred pharmacy is:  [facility] [address] [place] [facility] [place] [address] Phone: [phone] Fax: [phone]  [facility] - [address] [place] [address] Phone: [phone] Fax: [phone]  [facility] - [place], [place] -  [address] [place] [address] Phone: [phone] Fax: [phone]  Uses pill box? Yes Pt endorses good compliance  We discussed: Benefits of medication synchronization, packaging and delivery as well as enhanced pharmacist oversight with [employer]. Patient decided to: Continue current medication management strategy  Care Plan and Follow Up Patient Decision:  Patient agrees to Care Plan and Follow-up.  Plan: Telephone follow up appointment with care management team member scheduled for:  04/2022  [place] Constable, Pharm.D. - [phone]

## 2021-10-29 ENCOUNTER — Other Ambulatory Visit: Payer: Self-pay

## 2021-10-29 MED ORDER — INSULIN PEN NEEDLE 31G X 8 MM MISC: 1.0000 | Freq: Two times a day (BID) | 2 refills | Status: DC

## 2021-11-22 DIAGNOSIS — E1169 Type 2 diabetes mellitus with other specified complication: Secondary | ICD-10-CM

## 2021-11-22 DIAGNOSIS — E785 Hyperlipidemia, unspecified: Secondary | ICD-10-CM

## 2021-11-22 DIAGNOSIS — I110 Hypertensive heart disease with heart failure: Secondary | ICD-10-CM | POA: Diagnosis not present

## 2021-11-22 DIAGNOSIS — Z794 Long term (current) use of insulin: Secondary | ICD-10-CM

## 2021-11-25 ENCOUNTER — Encounter: Payer: Self-pay | Admitting: Pulmonary Disease

## 2021-11-25 ENCOUNTER — Ambulatory Visit: Payer: Medicare HMO | Admitting: Pulmonary Disease

## 2021-11-25 VITALS — BP 142/68 | HR 74 | Temp 97.6°F | Ht 66.0 in | Wt 186.8 lb

## 2021-11-25 DIAGNOSIS — J849 Interstitial pulmonary disease, unspecified: Secondary | ICD-10-CM | POA: Diagnosis not present

## 2021-11-25 MED ORDER — PREDNISONE 10 MG PO TABS: ORAL_TABLET | ORAL | 2 refills | Status: DC

## 2021-11-25 NOTE — Progress Notes (Signed)
? ?      Kerry Matthews    [id number]    05-Sep-1935 ? ?Primary Care Physician:Cox, Elnita Maxwell, MD ? ?Referring Physician: [name], MD ?[address] ?[address] ?[place],   [address] ? ?Chief complaint: Follow up for CTD interstitial lung disease, alpha-1 antitrypsin carrier ? ?HPI: ?86 year old with pulmonary fibrosis on CT chest. ?He has complains of joint stiffness, generalized myalgias, low-grade fevers starting July 2020.  The symptoms generally respond to prednisone.  Denies any renal symptoms, difficulty swallowing.  Ruled out for COVID- 19 multiple times. ? ?He has seen Dr. Trudie Reed, Rheumatology. Serologies reviewed which show elevated p-ANCA, myeloperoxidase with elevated CRP, normal sed rate, elevated IgG4 subclass, repeat rheumatoid factor is negative. 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 ? ?Was initially on prednisone and eventually got started on Rituxan by Dr. Trudie Reed from rheumatology in late 2020 with tapering off of prednisone.  Symptoms have been stable while on Rituxan ? ?He developed COVID-19 infection on 07/29/2021 which was treated as an outpatient with Molnupiravir.  However his situation deteriorated and was admitted to [facility] on 08/12/2021 to 08/25/2021. He was diagnosed with multifocal pneumonia, Acute Respiratory failure up to 15 L.  Treated with convalescent plasma, IV steroids which was transitioned to prednisone 60 mg.  Discharged on supplemental oxygen ?Started on insulin due to high blood sugars by primary care.  Unable to use Bactrim prophylaxis due to allergies ? ?He was treated with antibiotics at last visit in January 2022 for elevated WBC count and chest x-ray with persistent bilateral opacities ? ?Pets: Has a dog.  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 No mold, hot tub, Jacuzzi, down, humidifier ?Smoking history: Never smoker ?Travel history: No significant travel history ?Relevant family history: 2 sisters died of pulmonary fibrosis of unclear etiology.  1 of the sisters also had COPD ? ?Interim history:  ?Steroids are slowly weaning down.  He is now on 30 mg a day ?He is feeling much better today and states that dyspnea is improved ? ?Stop using supplemental oxygen during the daytime.  Continues on 1 L at night ? ? ?Outpatient Encounter Medications as of 11/25/2021  ?Medication Sig  ? aspirin EC 81 MG tablet Take 81 mg by mouth daily. Swallow whole.  ? Coenzyme Q10 100 MG TABS Take 100 mg by mouth at bedtime.  ? furosemide (LASIX) 20 MG tablet Take 1 tablet (20 mg total) by mouth daily as needed.  ? gabapentin (NEURONTIN) 400 MG capsule Take 400 mg by mouth 2 (two) times daily.  ? glucose blood (TRUE METRIX BLOOD GLUCOSE TEST) test strip E11.69 TEST BLOOD SUGAR THREE TIMES DAILY BEFORE MEALS  ? insulin aspart (NOVOLOG FLEXPEN) 100 UNIT/ML FlexPen Up to 16 U per day Check sugar prior to each meal and give dose below. 100 - 150: 4 U. 151 - 200: + 2 U (6 U) 201 - 250: + 4 U (8 U) 251 - 300: + 6 U (10 U) 301 - 350: + 8 U (12 U) 350 - 400: + 10 U (14 U) > 400: + 12 U (16U)  ? Insulin Pen Needle 31G X 8 MM MISC 1 each by Does not apply route in the morning and at bedtime.  ?  Multiple Vitamins-Minerals (PRESERVISION AREDS 2) CAPS Take 1 capsule by mouth 2 (two) times daily after a meal.  ? nitroGLYCERIN (NITROSTAT) 0.4 MG SL tablet Place 1 tablet (0.4 mg total) under the tongue every 5 (five) minutes as needed for chest pain.  ? Omega-3 Fatty Acids (FISH OIL) 1000 MG CAPS Take 2,000 mg by mouth daily.  ? omeprazole (PRILOSEC) 20 MG capsule Take 1 capsule (20 mg total) by mouth daily. (Patient taking differently: Take 20 mg by mouth daily before breakfast.)  ?  OXYGEN Inhale 2 L into the lungs daily.  ? predniSONE (DELTASONE) 10 MG tablet Take '30mg'$  daily starting March  ? rosuvastatin (CRESTOR) 20 MG tablet Take 1 tablet (20 mg total) by mouth daily. (Patient taking differently: Take 20 mg by mouth at bedtime.)  ? vitamin B-12 (CYANOCOBALAMIN) 1000 MCG tablet Take 1 tablet (1,000 mcg total) by mouth daily. (Patient taking differently: Take 1,000 mcg by mouth daily with breakfast.)  ? Continuous Blood Gluc Sensor (FREESTYLE LIBRE 3 SENSOR) MISC Place 1 sensor on the skin every 14 days. Use to check glucose continuously  ? LANTUS SOLOSTAR 100 UNIT/ML Solostar Pen Inject 20 Units into the skin 2 (two) times daily.  ? ?No facility-administered encounter medications on file as of 11/25/2021.  ? ?Physical Exam: ?Blood pressure (!) 142/68, pulse 74, temperature 97.6 ?F (36.4 ?C), temperature source Oral, height '5\' 6"'$  (1.676 m), weight 186 lb 12.8 oz (84.7 kg), SpO2 92 %. ?Gen:      No acute distress ?HEENT:  EOMI, sclera anicteric ?Neck:     No masses; no thyromegaly ?Lungs:    Clear to auscultation bilaterally; normal respiratory effort ?CV:         Regular rate and rhythm; no murmurs ?Abd:      + bowel sounds; soft, non-tender; no palpable masses, no distension ?Ext:    No edema; adequate peripheral perfusion ?Skin:      Warm and dry; no rash ?Neuro: alert and oriented x 3 ?Psych: normal mood and affect  ? ?Data Reviewed: ?Imaging: ?CT high-resolution 04/19/2019- patchy groundglass attenuation, septal thickening with reticulation and mild bronchiectasis with basal gradient.  No definitive honeycombing ?Probable UIP.  ? ?CT high-resolution 10/14/2019-stable findings of interstitial lung disease, pulmonary fibrosis and probable UIP pattern ? ?CTA 08/12/2021-no pulmonary embolism, patchy multifocal airspace disease ? ?CTA 08/18/2021-no PE, extensive bilateral homogeneous groundglass opacities with septal thickening, fibrotic changes at the bases. ? ?Chest x-ray 09/03/2021-patchy  peripheral interstitial abnormalities, cardiomegaly. ?I have reviewed the images personally ? ?PFTs: ?05/03/2019 ?FVC 3.22 [92%], FEV1 2.57 [104%], F/F 80, TLC 4.85 [95%], DLCO 18.93 [85%] ?Minimal restriction ? ?Labs: ?04/02/2019- p-ANCA 1:160, c-ANCA negative, MPO>100, rheumatoid factor 95 ?CCP negative, Sjogren's antibody negative, SCL 70- negative ?Jo 1-negative, aldolase 6.3, CK 31 ? ?Alpha-1 antitrypsin 04/12/2019- 124, PI MZ ? ?Assessment:  ?Recent hospitalization for COVID-19, multifocal bacterial pneumonia ?He is making slow recovery posthospitalization.  He may have a component of bronchiolitis obliterans, organizing pneumonia from [place].  He is currently on prednisone at 50 mg.  He will need a slow taper over the next couple of months.  Has elevated blood sugars that is being managed by his primary care ? ?We will reduce prednisone to 20 mg per day today and then to 10 mg in beginning of may.  Hold at that dose until he is reassessed in clinic in early June ? ?Order high-resolution CT ?He will likely need overnight oximetry in June to see if he can come off supplemental  oxygen altogether ? ?CHF, HFpEF ?Continue Lasix ? 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 ?Will need follow-up high-res CT later this year to evaluate underlying interstitial lung disease and pulmonary fibrosis ? ?Alpha-1 antitrypsin carrier status ?His A1AT levels are normal with no obstruction on pulmonary function test.  LFTs are normal ?Continue monitoring.  No need for treatment. ? ?Plan/Recommendations: ?Slow prednisone taper as above ?Lasix 20 mg a day ?CT and follow-up in June ? ?[name] MD ?[facility] ?11/25/2021, 10:06 AM ? ?CC: Cox, Kirsten, MD ? ? ? ?

## 2021-11-25 NOTE — Patient Instructions (Signed)
We will start tapering the prednisone.  Go to 20 mg a day for a month and then 10 mg a day.  Hold at that dose until you see me back in clinic ?Will order high-resolution CT follow-up in June 2023 and follow-up in clinic ?Continue Lasix at 20 mg a day ?I will ask your primary care to get labs later this week ? ?

## 2021-11-27 ENCOUNTER — Ambulatory Visit: Payer: Medicare HMO | Admitting: Podiatry

## 2021-11-28 ENCOUNTER — Telehealth: Payer: Self-pay | Admitting: Family Medicine

## 2021-11-28 NOTE — Telephone Encounter (Signed)
Spoke with [name], she VU of sliding scale.  ? ?Next appointment: 5/4.  ? ?Harrell Lark 11/28/21 4:05 PM ? ?

## 2021-11-28 NOTE — Telephone Encounter (Signed)
Prednisone decreased to 30 mg daily.  ?Sugars currently  ?80-126 fasting, 122-200 before lunch, 116-238 before supper, before bed 82-205.  ? ?My last recommendations were to decrease lantus to 20 U in am and 10 U in pm and continue sliding scale insulin with novolog. ?Check sugar prior to each meal and give dose below. 100 - 150: 4 U. 151 - 200: + 2 U (6 U) 201 - 250: + 4 U (8 U) 251 - 300: + 6 U (10 U) 301 - 350: + 8 U (12 U) 350 - 400: + 10 U (14 U) > 400: + 12 U (16U.) ? ?Recommend change sliding scale to  ?4 U before meals.  ?100 - 150: 2 U. (Decreased from 4 U) ?151 - 200: + 2 U (6 U)  ?201 - 250: + 4 U (8 U)  ?251 - 300: + 6 U (10 U)  ?301 - 350: + 8 U (12 U)  ?350 - 400: + 10 U (14 U)  ?> 400: + 12 U (16U.) ? ?

## 2021-12-02 ENCOUNTER — Other Ambulatory Visit: Payer: Self-pay

## 2021-12-02 DIAGNOSIS — I119 Hypertensive heart disease without heart failure: Secondary | ICD-10-CM

## 2021-12-02 DIAGNOSIS — D72828 Other elevated white blood cell count: Secondary | ICD-10-CM

## 2021-12-03 ENCOUNTER — Telehealth: Payer: Self-pay

## 2021-12-03 ENCOUNTER — Other Ambulatory Visit: Payer: Self-pay | Admitting: Family Medicine

## 2021-12-03 NOTE — Chronic Care Management (AMB) (Signed)
? ? ?Chronic Care Management ?Pharmacy Assistant  ? ?Name: Kerry Matthews  MRN: [id number] DOB: 06/20/1936 ? ? ?Reason for Encounter: Disease State call for DM  ?  ?Recent office visits:  ?11/28/21 [name] MD. Orders Only. Prednisone decreased to 30 mg daily.  ? ?Recent consult visits:  ?11/25/21 (Pulmonologist ) [name] MD. Seen for Interstitial Lung Disease. We will start tapering the prednisone.  Go to 20 mg a day for a month and then 10 mg a day.  ? ?Hospital visits:  ?None ? ?Medications: ?Outpatient Encounter Medications as of 12/03/2021  ?Medication Sig  ? aspirin EC 81 MG tablet Take 81 mg by mouth daily. Swallow whole.  ? Coenzyme Q10 100 MG TABS Take 100 mg by mouth at bedtime.  ? Continuous Blood Gluc Sensor (FREESTYLE LIBRE 3 SENSOR) MISC Place 1 sensor on the skin every 14 days. Use to check glucose continuously  ? DROPLET PEN NEEDLES 31G X 8 MM MISC USE AS DIRECTED IN THE MORNING AND AT BEDTIME.  ? furosemide (LASIX) 20 MG tablet Take 1 tablet (20 mg total) by mouth daily as needed.  ? gabapentin (NEURONTIN) 400 MG capsule Take 400 mg by mouth 2 (two) times daily.  ? glucose blood (TRUE METRIX BLOOD GLUCOSE TEST) test strip E11.69 TEST BLOOD SUGAR THREE TIMES DAILY BEFORE MEALS  ? insulin aspart (NOVOLOG FLEXPEN) 100 UNIT/ML FlexPen Up to 16 U per day Check sugar prior to each meal and give dose below. 100 - 150: 4 U. 151 - 200: + 2 U (6 U) 201 - 250: + 4 U (8 U) 251 - 300: + 6 U (10 U) 301 - 350: + 8 U (12 U) 350 - 400: + 10 U (14 U) > 400: + 12 U (16U)  ? LANTUS SOLOSTAR 100 UNIT/ML Solostar Pen Inject 20 Units into the skin 2 (two) times daily.  ? Multiple Vitamins-Minerals (PRESERVISION AREDS 2) CAPS Take 1 capsule by mouth 2 (two) times daily after a meal.  ? nitroGLYCERIN (NITROSTAT) 0.4 MG SL tablet Place 1 tablet (0.4 mg total) under the tongue every 5 (five) minutes as needed for chest pain.  ? Omega-3 Fatty Acids (FISH OIL) 1000 MG CAPS Take 2,000 mg by mouth daily.  ? omeprazole  (PRILOSEC) 20 MG capsule Take 1 capsule (20 mg total) by mouth daily. (Patient taking differently: Take 20 mg by mouth daily before breakfast.)  ? OXYGEN Inhale 2 L into the lungs daily.  ? predniSONE (DELTASONE) 10 MG tablet Take '30mg'$  daily starting March  ? predniSONE (DELTASONE) 10 MG tablet Take '20mg'$  daily for 1 month, then '10mg'$  daily  ? rosuvastatin (CRESTOR) 20 MG tablet Take 1 tablet (20 mg total) by mouth daily. (Patient taking differently: Take 20 mg by mouth at bedtime.)  ? vitamin B-12 (CYANOCOBALAMIN) 1000 MCG tablet Take 1 tablet (1,000 mcg total) by mouth daily. (Patient taking differently: Take 1,000 mcg by mouth daily with breakfast.)  ? ?No facility-administered encounter medications on file as of 12/03/2021.  ? ? ?Recent Relevant Labs: ?Lab Results  ?Component Value Date/Time  ? HGBA1C 6.3 (H) 08/07/2021 10:31 AM  ? HGBA1C 6.7 (H) 04/04/2021 08:33 AM  ? MICROALBUR 10 04/11/2021 11:54 AM  ? MICROALBUR 10 03/06/2020 03:07 PM  ?  ?Kidney Function ?Lab Results  ?Component Value Date/Time  ? CREATININE 1.25 10/07/2021 11:00 AM  ? CREATININE 1.25 09/20/2021 11:21 AM  ? GFR 53.09 (L) 09/20/2021 11:21 AM  ? GFRNONAA >60 08/23/2021 01:27 AM  ?  GFRAA 55 (L) 07/26/2020 09:53 AM  ? ? ? ?Current antihyperglycemic regimen:  ?Lantus Solostart 100units Inject 20 units two times daily ?Novolog 100units Up to 16 U per day  ?Patient verbally confirms he is taking the above medications as directed. Yes ? ?What recent interventions/DTPs have been made to improve glycemic control:  ?Pt denies any changes  ? ?Have there been any recent hospitalizations or ED visits since last visit with CPP? No ? ?Patient denies hypoglycemic symptoms, including None ? ?Patient denies hyperglycemic symptoms, including none ? ?How often are you checking your blood sugar? once daily ? ?What are your blood sugars ranging?  ?Fasting: 12/03/21 110, 12/02/21 90, 12/01/21 77 ?After Meal 124, 140, 337 on Easter 12/01/21 ? ?On insulin? Yes ?How many  units:20 units in am and 8 in pm and sliding scale 8 to 16 units  ? ?During the week, how often does your blood glucose drop below 70? Never ? ?Are you checking your feet daily/regularly? Yes ? ?Adherence Review: ?Is the patient currently on a STATIN medication? Yes ?Is the patient currently on ACE/ARB medication? No ?Does the patient have >5 day gap between last estimated fill dates? CPP to review ? ?Care Gaps: ?Last eye exam / Retinopathy Screening? 04/02/21 ?Last Annual Wellness Visit? None noted ?Last Diabetic Foot Exam? 08/06/21 ? ? ?Star Rating Drugs:  ?Medication:  Last Fill: Day Supply ?None noted  ? ?[name], CMA ?Clinical Pharmacist Assistant  ?[phone]  ?

## 2021-12-03 NOTE — Telephone Encounter (Signed)
Refill sent to pharmacy.   

## 2021-12-04 ENCOUNTER — Ambulatory Visit: Payer: Medicare HMO | Admitting: Podiatry

## 2021-12-04 ENCOUNTER — Encounter: Payer: Self-pay | Admitting: Podiatry

## 2021-12-04 DIAGNOSIS — Z794 Long term (current) use of insulin: Secondary | ICD-10-CM | POA: Diagnosis not present

## 2021-12-04 DIAGNOSIS — E1165 Type 2 diabetes mellitus with hyperglycemia: Secondary | ICD-10-CM | POA: Diagnosis not present

## 2021-12-04 DIAGNOSIS — B351 Tinea unguium: Secondary | ICD-10-CM

## 2021-12-04 DIAGNOSIS — M79674 Pain in right toe(s): Secondary | ICD-10-CM | POA: Diagnosis not present

## 2021-12-04 DIAGNOSIS — M79675 Pain in left toe(s): Secondary | ICD-10-CM | POA: Diagnosis not present

## 2021-12-04 DIAGNOSIS — G629 Polyneuropathy, unspecified: Secondary | ICD-10-CM | POA: Diagnosis not present

## 2021-12-04 NOTE — Progress Notes (Signed)
?Subjective:  ?Patient ID: Kerry Matthews, male    DOB: 10-03-1935,   MRN: [id number] ? ?No chief complaint on file. ? ? ?86 y.o. male presents for concern of thickened elongated and painful nails that are difficult to trim. Requesting to have them trimmed today. Denies any burning or tingling in her feet. Patient is diabetic and last A1c was 6.3.  ? . Denies any other pedal complaints. Denies n/v/f/c.  ? ?PCP: [name] MD  ? ?Past Medical History:  ?Diagnosis Date  ? Abnormal ANCA test 04/12/2019  ? 04/02/2019- MPO/PR-3  ANCA antibodies- myeloperoxidase ABS-greater than 100, ANCA proteinase 3-less than 3.5 04/02/2019- ANCA titers- p-ANCA +1: 160, C ANCA-less than 1: 20, atypical p-ANCA titer-less than 1: 20  ? Abnormal CT of the chest   ? Abnormal findings on diagnostic imaging of lung 04/12/2019  ? 04/01/2019-CT chest with contrast- no acute process in chest abdomen or pelvis, interstitial lung disease suspicious for early or mild UIP, pulmonary artery enlargement suggest PAH   ? Acute low back pain without sciatica 03/08/2020  ? Acute low back pain without sciatica 03/08/2020  ? Acute lower UTI 07/07/2020  ? Acute pain of left knee 11/30/2019  ? AKI (acute kidney injury) ([place])   ? Alpha-1-antitrypsin deficiency carrier 05/04/2019  ? Anemia   ? Atherosclerotic heart disease of native coronary artery without angina pectoris 05/03/2018  ? Body mass index (BMI) 29.0-29.9, adult 01/05/2020  ? Bradycardia   ? Burning sensation of feet 01/05/2020  ? CKD (chronic kidney disease) 05/03/2018  ? Community acquired pneumonia   ? Community acquired pneumonia   ? Coronary artery disease   ? Cough   ? COVID-19 virus infection 08/12/2021  ? Diabetes ([place])   ? Diabetes mellitus type 2 in obese ([place]) 04/01/2019  ? Dyslipidemia associated with type 2 diabetes mellitus ([place]) 05/03/2018  ? Dysuria 09/07/2020  ? Elevated LFTs 04/01/2019  ? Elevated rheumatoid factor 04/12/2019  ? 04/03/2019-rheumatoid factor-95.4  ? Emphysema (subcutaneous)  (surgical) resulting from a procedure 05/03/2018  ? Essential hypertension 05/03/2018  ? Fatigue 03/29/2021  ? Fever   ? GAD (generalized anxiety disorder)   ? GERD (gastroesophageal reflux disease)   ? Hematuria 09/07/2020  ? History of alpha-1-antitrypsin deficiency 04/12/2019  ? History of kidney stones   ? Hyperlipidemia   ? Hypoxemia   ? Idiopathic pulmonary fibrosis ([place]) 03/29/2021  ? 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 Insomnia 05/03/2018  ? Interstitial pulmonary disease ([place]) 04/12/2019  ? 04/01/2019-CT chest with contrast- no acute process in chest abdomen or pelvis, interstitial lung disease suspicious for early or mild UIP, pulmonary artery enlargement suggest PAH  04/02/2019-connective tissue work-up: Anti-Jo 1-negative Anti-DNA antibody double-stranded-negative Anti-scleroderma antibody-negative Sjogren's syndrome antibody-negative Sjogren's syndrome antibody-negative CK-31 CCP-6 E  ? Leukocytosis 03/08/2020  ? Low vitamin B12 level 04/03/2019  ? Lower extremity edema   ? Medicare annual wellness visit, subsequent 08/09/2020  ? Microscopic polyangiitis ([place]) 03/29/2021  ? Mixed hyperlipidemia 05/03/2018  ? Myalgia 04/19/2019  ? Osteoarthritis   ? Other emphysema ([place])   ? Other long term (current) drug therapy 03/29/2021  ? Paresthesias in left hand 11/30/2019  ? Peripheral polyneuropathy 01/30/2020  ? Pneumonia 04/01/2019  ? Pneumonia of both lungs due to infectious organism 08/12/2021  ? Pneumonitis   ? Pneumonitis   ? Polymyositis ([place])   ? Primary insomnia   ? Proteinuria 09/07/2020  ? Renal insufficiency 09/06/2020  ? Renal stones   ? Rheumatoid factor positive 03/29/2021  ? Sepsis (  [place]) 04/19/2019  ? Severe sepsis ([place]) 07/07/2020  ? Skin cancer   ? 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 Transaminitis   ? Trigger finger 05/03/2018  ? UC (ulcerative colitis) ([place])   ? Urinary urgency 09/07/2020  ? UTI (urinary tract infection)   ? Vasculitis ([place]) 03/29/2021  ? ? ?Objective:  ?Physical  Exam: ?Vascular: DP/PT pulses 2/4 bilateral. CFT <3 seconds. Absent hair growth on digits. Edema noted to bilateral lower extremities. Xerosis noted bilaterally.  ?Skin. No lacerations or abrasions bilateral feet. Nails 1-5 bilateral  are thickened discolored and elongated with subungual debris.  ?Musculoskeletal: MMT 5/5 bilateral lower extremities in DF, PF, Inversion and Eversion. Deceased ROM in DF of ankle joint.  ?Neurological: Sensation intact to light touch. Protective sensation diminished bilateral.  ? ? ?Assessment:  ? ?1. Peripheral polyneuropathy   ?2. Type 2 diabetes mellitus with hyperglycemia, with long-term current use of insulin ([place])   ?3. Pain due to onychomycosis of toenails of both feet   ? ? ? ?Plan:  ?Patient was evaluated and treated and all questions answered. ?-Discussed and educated patient on diabetic foot care, especially with  ?regards to the vascular, neurological and musculoskeletal systems.  ?-Stressed the importance of good glycemic control and the detriment of not  ?controlling glucose levels in relation to the foot. ?-Discussed supportive shoes at all times and checking feet regularly.  ?-Mechanically debrided all nails 1-5 bilateral using sterile nail nipper and filed with dremel without incident  ?-Answered all patient questions ?-Patient to return  in 3 months for at risk foot care ?-Patient advised to call the office if any problems or questions arise in the meantime. ? ? ?Lorenda Peck, DPM  ? ? ?

## 2021-12-06 ENCOUNTER — Encounter: Payer: Self-pay | Admitting: Nurse Practitioner

## 2021-12-06 ENCOUNTER — Ambulatory Visit (INDEPENDENT_AMBULATORY_CARE_PROVIDER_SITE_OTHER): Payer: Medicare HMO | Admitting: Nurse Practitioner

## 2021-12-06 VITALS — BP 122/78 | HR 62 | Temp 98.2°F | Ht 66.0 in | Wt 189.2 lb

## 2021-12-06 DIAGNOSIS — I119 Hypertensive heart disease without heart failure: Secondary | ICD-10-CM | POA: Diagnosis not present

## 2021-12-06 DIAGNOSIS — K061 Gingival enlargement: Secondary | ICD-10-CM

## 2021-12-06 DIAGNOSIS — M272 Inflammatory conditions of jaws: Secondary | ICD-10-CM | POA: Diagnosis not present

## 2021-12-06 DIAGNOSIS — D72828 Other elevated white blood cell count: Secondary | ICD-10-CM | POA: Diagnosis not present

## 2021-12-06 MED ORDER — AMOXICILLIN-POT CLAVULANATE 875-125 MG PO TABS: 1.0000 | ORAL_TABLET | Freq: Two times a day (BID) | ORAL | 0 refills | Status: DC

## 2021-12-06 NOTE — Patient Instructions (Addendum)
Take Augmentin twice daily for 5 days, with food ?Call dentist on Monday, April 17th, 2023 to make appointment ?Take probiotics, eat yogurt ?Follow-up as needed ? ? ? ?Gingivitis ?Gingivitis is redness, soreness, and swelling (inflammation) of the gums. This condition is usually mild and clears up with treatment and proper cleaning of the teeth. ?If not treated, the condition can get worse and lead to other problems with the teeth and gums. ?What are the causes? ?Germs (plaque) that build up on the teeth and gums. ?When plaque builds up, it mixes with the spit (saliva) inthe mouth to form a hard substance called tartar or calculus, which becomes trapped around the base of the tooth. This irritates the gums and causes germs to build up. ?This happens because of poor care and cleaning of the mouth and teeth (oral hygiene). ?What increases the risk? ?Not properly cleaning and taking care of your mouth and teeth. ?Eating a diet that does not provide proper nutrition. ?Taking certain medicines. ?Being pregnant. ?Going through puberty or menopause. ?Using tobacco products. ?Having certain medical conditions, such as: ?Diabetes. ?Some infections. ?Dry mouth. ?Wearing dental devices that do not fit well. ?What are the signs or symptoms? ? ?Gums that bleed easily. This may happen during flossing or brushing. ?Swollen gums. ?Gums that are bright red or purple. ?Receding gums. This means that the gums are wearing away from the teeth so that more of the tooth is exposed. ?Bad breath. ?How is this treated? ?Having your teeth and gums cleaned at the dentist's office. ?Taking good care of your mouth and teeth at home. This includes careful brushing and regular flossing. ?Using a mouthwash. In some cases, a mouthwash may be prescribed or suggested. ?Very bad cases of this condition may be treated with antibiotic medicine or surgery. ?Follow these instructions at home: ? ?  ? ?Follow instructions from your dentist about how to clean  your teeth. Make sure you: ?Brush your teeth two times a day with a soft-bristled toothbrush. ?Floss at least one time each day. Do this before you brush your teeth. ?Use a mouthwash as told by your dentist. ?Eat a well-balanced diet. Between meals, limit foods and drinks that have sugar in them. ?See a dentist regularly for cleaning and checkups. ?Do not smoke or use any products that contain nicotine or tobacco. If you need help quitting, ask your doctor ?If you were prescribed an antibiotic medicine, take it as told by your dentist. Do not stop using it even if you start to feel better. ?Keep all follow-up visits. ?Contact a doctor if: ?You have a fever. ?You have a lot of bleeding from your gums. ?You have pain in your gums or teeth. ?You have trouble chewing. ?You have any loose or infected teeth. ?You have swollen glands in your face or neck. ?Summary ?Gingivitis is redness, soreness, and swelling (inflammation) of the gums. ?This condition often clears up with treatment and proper cleaning of the teeth. ?Brush your teeth two times a day. Floss at least one time each day. ?Keep all follow-up visits. ?This information is not intended to replace advice given to you by your health care provider. Make sure you discuss any questions you have with your health care provider. ?Document Revised: 01/10/2021 Document Reviewed: 01/10/2021 ?Elsevier Patient Education ? [employer]. ? ?

## 2021-12-06 NOTE — Progress Notes (Signed)
? ?Acute Office Visit ? ?Subjective:  ? ? Patient ID: Kerry Matthews, male    DOB: 1936/03/18, 86 y.o.   MRN: [id number] ? ?Chief Complaint  ?Patient presents with  ? Jaw Pain  ? ? ?HPI: ?Patient is in today for lower jaw pain at gumline. Onset 2-3 days ago. Treatment includes Tylenol. Denies fever, chills, malaise, or dysphagia. States he saw a dentist a few months ago to replace a cap. He has not had a dental cleaning or routine exam in 6 years per spouse.  ?Past Medical History:  ?Diagnosis Date  ? Abnormal ANCA test 04/12/2019  ? 04/02/2019- MPO/PR-3  ANCA antibodies- myeloperoxidase ABS-greater than 100, ANCA proteinase 3-less than 3.5 04/02/2019- ANCA titers- p-ANCA +1: 160, C ANCA-less than 1: 20, atypical p-ANCA titer-less than 1: 20  ? Abnormal CT of the chest   ? Abnormal findings on diagnostic imaging of lung 04/12/2019  ? 04/01/2019-CT chest with contrast- no acute process in chest abdomen or pelvis, interstitial lung disease suspicious for early or mild UIP, pulmonary artery enlargement suggest PAH   ? Acute low back pain without sciatica 03/08/2020  ? Acute low back pain without sciatica 03/08/2020  ? Acute lower UTI 07/07/2020  ? Acute pain of left knee 11/30/2019  ? AKI (acute kidney injury) ([place])   ? Alpha-1-antitrypsin deficiency carrier 05/04/2019  ? Anemia   ? Atherosclerotic heart disease of native coronary artery without angina pectoris 05/03/2018  ? Body mass index (BMI) 29.0-29.9, adult 01/05/2020  ? Bradycardia   ? Burning sensation of feet 01/05/2020  ? CKD (chronic kidney disease) 05/03/2018  ? Community acquired pneumonia   ? Community acquired pneumonia   ? Coronary artery disease   ? Cough   ? COVID-19 virus infection 08/12/2021  ? Diabetes ([place])   ? Diabetes mellitus type 2 in obese ([place]) 04/01/2019  ? Dyslipidemia associated with type 2 diabetes mellitus ([place]) 05/03/2018  ? Dysuria 09/07/2020  ? Elevated LFTs 04/01/2019  ? Elevated rheumatoid factor 04/12/2019  ? 04/03/2019-rheumatoid factor-95.4   ? Emphysema (subcutaneous) (surgical) resulting from a procedure 05/03/2018  ? Essential hypertension 05/03/2018  ? Fatigue 03/29/2021  ? Fever   ? GAD (generalized anxiety disorder)   ? GERD (gastroesophageal reflux disease)   ? Hematuria 09/07/2020  ? History of alpha-1-antitrypsin deficiency 04/12/2019  ? History of kidney stones   ? Hyperlipidemia   ? Hypoxemia   ? Idiopathic pulmonary fibrosis ([place]) 03/29/2021  ? 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 Insomnia 05/03/2018  ? Interstitial pulmonary disease ([place]) 04/12/2019  ? 04/01/2019-CT chest with contrast- no acute process in chest abdomen or pelvis, interstitial lung disease suspicious for early or mild UIP, pulmonary artery enlargement suggest PAH  04/02/2019-connective tissue work-up: Anti-Jo 1-negative Anti-DNA antibody double-stranded-negative Anti-scleroderma antibody-negative Sjogren's syndrome antibody-negative Sjogren's syndrome antibody-negative CK-31 CCP-6 E  ? Leukocytosis 03/08/2020  ? Low vitamin B12 level 04/03/2019  ? Lower extremity edema   ? Medicare annual wellness visit, subsequent 08/09/2020  ? Microscopic polyangiitis ([place]) 03/29/2021  ? Mixed hyperlipidemia 05/03/2018  ? Myalgia 04/19/2019  ? Osteoarthritis   ? Other emphysema ([place])   ? Other long term (current) drug therapy 03/29/2021  ? Paresthesias in left hand 11/30/2019  ? Peripheral polyneuropathy 01/30/2020  ? Pneumonia 04/01/2019  ? Pneumonia of both lungs due to infectious organism 08/12/2021  ? Pneumonitis   ? Pneumonitis   ? Polymyositis ([place])   ? Primary insomnia   ? Proteinuria 09/07/2020  ? Renal insufficiency 09/06/2020  ? Renal  stones   ? Rheumatoid factor positive 03/29/2021  ? Sepsis ([place]) 04/19/2019  ? Severe sepsis ([place]) 07/07/2020  ? Skin cancer   ? 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 Transaminitis   ? Trigger finger 05/03/2018  ? UC (ulcerative colitis) ([place])   ? Urinary urgency 09/07/2020  ? UTI (urinary tract infection)   ? Vasculitis ([place]) 03/29/2021   ? ? ?Past Surgical History:  ?Procedure Laterality Date  ? ANGIOPLASTY  2010  ? BACK SURGERY    ? CATARACT EXTRACTION    ? COLONOSCOPY  06/16/2005  ? Mild colitis involving splenic flexure. Colonic polyps, status post polypectomy. Mild pancolonic diverticulitits. Internal hemorrhoids.   ? ESOPHAGOGASTRODUODENOSCOPY  04/26/2003  ? Irregular Z line suggestive of GERD. Mild gastritis status post CLO testing.   ? TOTAL KNEE ARTHROPLASTY Left 12/17/2020  ? Procedure: LEFT TOTAL KNEE ARTHROPLASTY;  Surgeon: Leandrew Koyanagi, MD;  Location: [facility];  Service: Orthopedics;  Laterality: Left;  ? TRIGGER FINGER RELEASE    ? ? ?Family History  ?Problem Relation Age of Onset  ? Tuberculosis Mother   ? Stroke Father   ? Pancreatic cancer Sister   ? Heart attack Sister   ? Lung disease Sister   ? Clotting disorder Brother   ? Colon cancer Neg Hx   ? Esophageal cancer Neg Hx   ? ? ?Social History  ? ?Socioeconomic History  ? Marital status: Married  ?  Spouse name: Not on file  ? Number of children: 2  ? Years of education: Not on file  ? Highest education level: Not on file  ?Occupational History  ? Occupation: retired  ?Tobacco Use  ? Smoking status: Never  ? Smokeless tobacco: Never  ?Vaping Use  ? Vaping Use: Never used  ?Substance and Sexual Activity  ? Alcohol use: Never  ? Drug use: Never  ? Sexual activity: Not on file  ?Other Topics Concern  ? Not on file  ?Social History Narrative  ? Not on file  ? ?Social Determinants of Health  ? ?Financial Resource Strain: Low Risk   ? Difficulty of Paying Living Expenses: Not hard at all  ?Food Insecurity: Not on file  ?Transportation Needs: No Transportation Needs  ? Lack of Transportation (Medical): No  ? Lack of Transportation (Non-Medical): No  ?Physical Activity: Not on file  ?Stress: Not on file  ?Social Connections: Not on file  ?Intimate Partner Violence: Not on file  ? ? ?Outpatient Medications Prior to Visit  ?Medication Sig Dispense Refill  ? aspirin EC 81 MG tablet Take 81 mg  by mouth daily. Swallow whole.    ? Coenzyme Q10 100 MG TABS Take 100 mg by mouth at bedtime.    ? Continuous Blood Gluc Sensor (FREESTYLE LIBRE 3 SENSOR) MISC Place 1 sensor on the skin every 14 days. Use to check glucose continuously 6 each 0  ? DROPLET PEN NEEDLES 31G X 8 MM MISC USE AS DIRECTED IN THE MORNING AND AT BEDTIME. 200 each 1  ? furosemide (LASIX) 20 MG tablet Take 1 tablet (20 mg total) by mouth daily as needed. 90 tablet 1  ? gabapentin (NEURONTIN) 400 MG capsule Take 400 mg by mouth 2 (two) times daily.    ? glucose blood (TRUE METRIX BLOOD GLUCOSE TEST) test strip E11.69 TEST BLOOD SUGAR THREE TIMES DAILY BEFORE MEALS 300 strip 3  ? insulin aspart (NOVOLOG FLEXPEN) 100 UNIT/ML FlexPen Up to 16 U per day Check sugar prior to each  meal and give dose below. 100 - 150: 4 U. 151 - 200: + 2 U (6 U) 201 - 250: + 4 U (8 U) 251 - 300: + 6 U (10 U) 301 - 350: + 8 U (12 U) 350 - 400: + 10 U (14 U) > 400: + 12 U (16U) 15 mL 11  ? LANTUS SOLOSTAR 100 UNIT/ML Solostar Pen Inject 20 Units into the skin 2 (two) times daily. 15 mL 1  ? Multiple Vitamins-Minerals (PRESERVISION AREDS 2) CAPS Take 1 capsule by mouth 2 (two) times daily after a meal.    ? nitroGLYCERIN (NITROSTAT) 0.4 MG SL tablet Place 1 tablet (0.4 mg total) under the tongue every 5 (five) minutes as needed for chest pain. 25 tablet 6  ? Omega-3 Fatty Acids (FISH OIL) 1000 MG CAPS Take 2,000 mg by mouth daily.    ? omeprazole (PRILOSEC) 20 MG capsule Take 1 capsule (20 mg total) by mouth daily. (Patient taking differently: Take 20 mg by mouth daily before breakfast.) 90 capsule 1  ? OXYGEN Inhale 2 L into the lungs daily.    ? predniSONE (DELTASONE) 10 MG tablet Take '30mg'$  daily starting March 110 tablet 0  ? predniSONE (DELTASONE) 10 MG tablet Take '20mg'$  daily for 1 month, then '10mg'$  daily 60 tablet 2  ? rosuvastatin (CRESTOR) 20 MG tablet Take 1 tablet (20 mg total) by mouth daily. (Patient taking differently: Take 20 mg by mouth at bedtime.) 90  tablet 3  ? vitamin B-12 (CYANOCOBALAMIN) 1000 MCG tablet Take 1 tablet (1,000 mcg total) by mouth daily. (Patient taking differently: Take 1,000 mcg by mouth daily with breakfast.) 30 tablet 0  ? ?No facilit

## 2021-12-07 LAB — CBC WITH DIFF/PLATELET
Basophils Absolute: 0.1 10*3/uL (ref 0.0–0.2)
Basos: 1 %
EOS (ABSOLUTE): 0.5 10*3/uL — ABNORMAL HIGH (ref 0.0–0.4)
Eos: 4 %
Hematocrit: 45.1 % (ref 37.5–51.0)
Hemoglobin: 15.3 g/dL (ref 13.0–17.7)
Immature Grans (Abs): 0.3 10*3/uL — ABNORMAL HIGH (ref 0.0–0.1)
Immature Granulocytes: 2 %
Lymphocytes Absolute: 2.4 10*3/uL (ref 0.7–3.1)
Lymphs: 18 %
MCH: 28.9 pg (ref 26.6–33.0)
MCHC: 33.9 g/dL (ref 31.5–35.7)
MCV: 85 fL (ref 79–97)
Monocytes Absolute: 0.7 10*3/uL (ref 0.1–0.9)
Monocytes: 5 %
Neutrophils Absolute: 9.6 10*3/uL — ABNORMAL HIGH (ref 1.4–7.0)
Neutrophils: 70 %
Platelets: 218 10*3/uL (ref 150–450)
RBC: 5.3 x10E6/uL (ref 4.14–5.80)
RDW: 13.6 % (ref 11.6–15.4)
WBC: 13.5 10*3/uL — ABNORMAL HIGH (ref 3.4–10.8)

## 2021-12-07 LAB — COMPREHENSIVE METABOLIC PANEL
ALT: 21 IU/L (ref 0–44)
AST: 23 IU/L (ref 0–40)
Albumin/Globulin Ratio: 1.7 (ref 1.2–2.2)
Albumin: 4.1 g/dL (ref 3.6–4.6)
Alkaline Phosphatase: 77 IU/L (ref 44–121)
BUN/Creatinine Ratio: 16 (ref 10–24)
BUN: 22 mg/dL (ref 8–27)
Bilirubin Total: 0.5 mg/dL (ref 0.0–1.2)
CO2: 24 mmol/L (ref 20–29)
Calcium: 8.8 mg/dL (ref 8.6–10.2)
Chloride: 107 mmol/L — ABNORMAL HIGH (ref 96–106)
Creatinine, Ser: 1.36 mg/dL — ABNORMAL HIGH (ref 0.76–1.27)
Globulin, Total: 2.4 g/dL (ref 1.5–4.5)
Glucose: 125 mg/dL — ABNORMAL HIGH (ref 70–99)
Potassium: 4.8 mmol/L (ref 3.5–5.2)
Sodium: 146 mmol/L — ABNORMAL HIGH (ref 134–144)
Total Protein: 6.5 g/dL (ref 6.0–8.5)
eGFR: 51 mL/min/{1.73_m2} — ABNORMAL LOW (ref >59)

## 2021-12-09 ENCOUNTER — Other Ambulatory Visit: Payer: Self-pay

## 2021-12-09 DIAGNOSIS — Z6828 Body mass index (BMI) 28.0-28.9, adult: Secondary | ICD-10-CM | POA: Diagnosis not present

## 2021-12-09 DIAGNOSIS — M317 Microscopic polyangiitis: Secondary | ICD-10-CM | POA: Diagnosis not present

## 2021-12-09 DIAGNOSIS — R5383 Other fatigue: Secondary | ICD-10-CM | POA: Diagnosis not present

## 2021-12-09 DIAGNOSIS — J84112 Idiopathic pulmonary fibrosis: Secondary | ICD-10-CM | POA: Diagnosis not present

## 2021-12-09 DIAGNOSIS — Z79899 Other long term (current) drug therapy: Secondary | ICD-10-CM | POA: Diagnosis not present

## 2021-12-09 DIAGNOSIS — R768 Other specified abnormal immunological findings in serum: Secondary | ICD-10-CM | POA: Diagnosis not present

## 2021-12-09 DIAGNOSIS — E663 Overweight: Secondary | ICD-10-CM | POA: Diagnosis not present

## 2021-12-09 MED ORDER — BD SWAB SINGLE USE REGULAR PADS: 1.0000 | MEDICATED_PAD | Freq: Four times a day (QID) | 3 refills | Status: DC

## 2021-12-10 DIAGNOSIS — L570 Actinic keratosis: Secondary | ICD-10-CM | POA: Diagnosis not present

## 2021-12-10 DIAGNOSIS — L578 Other skin changes due to chronic exposure to nonionizing radiation: Secondary | ICD-10-CM | POA: Diagnosis not present

## 2021-12-10 DIAGNOSIS — L821 Other seborrheic keratosis: Secondary | ICD-10-CM | POA: Diagnosis not present

## 2021-12-10 DIAGNOSIS — C44529 Squamous cell carcinoma of skin of other part of trunk: Secondary | ICD-10-CM | POA: Diagnosis not present

## 2021-12-12 IMAGING — DX DG KNEE 1-2V*L*
2 series · 2 of 2 positions shown · non-contrast
Comparison: Left knee MRI 12/19/2019.

CLINICAL DATA: 85-year-old male with planned left knee surgery.

EXAM:
LEFT KNEE - 1-2 VIEW

[knee ap]
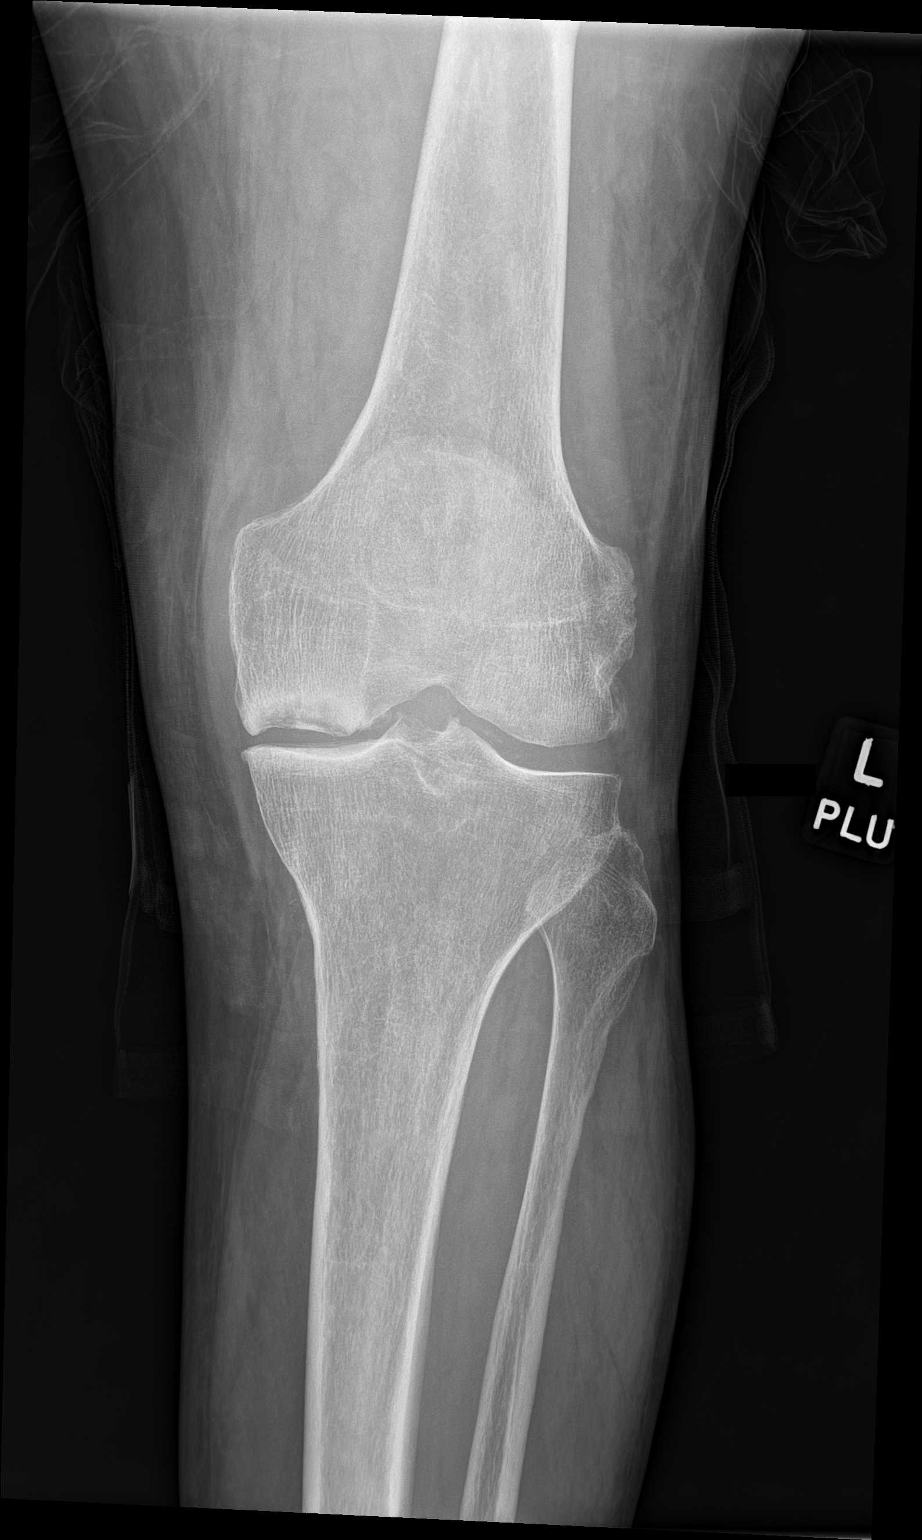

[knee lat]
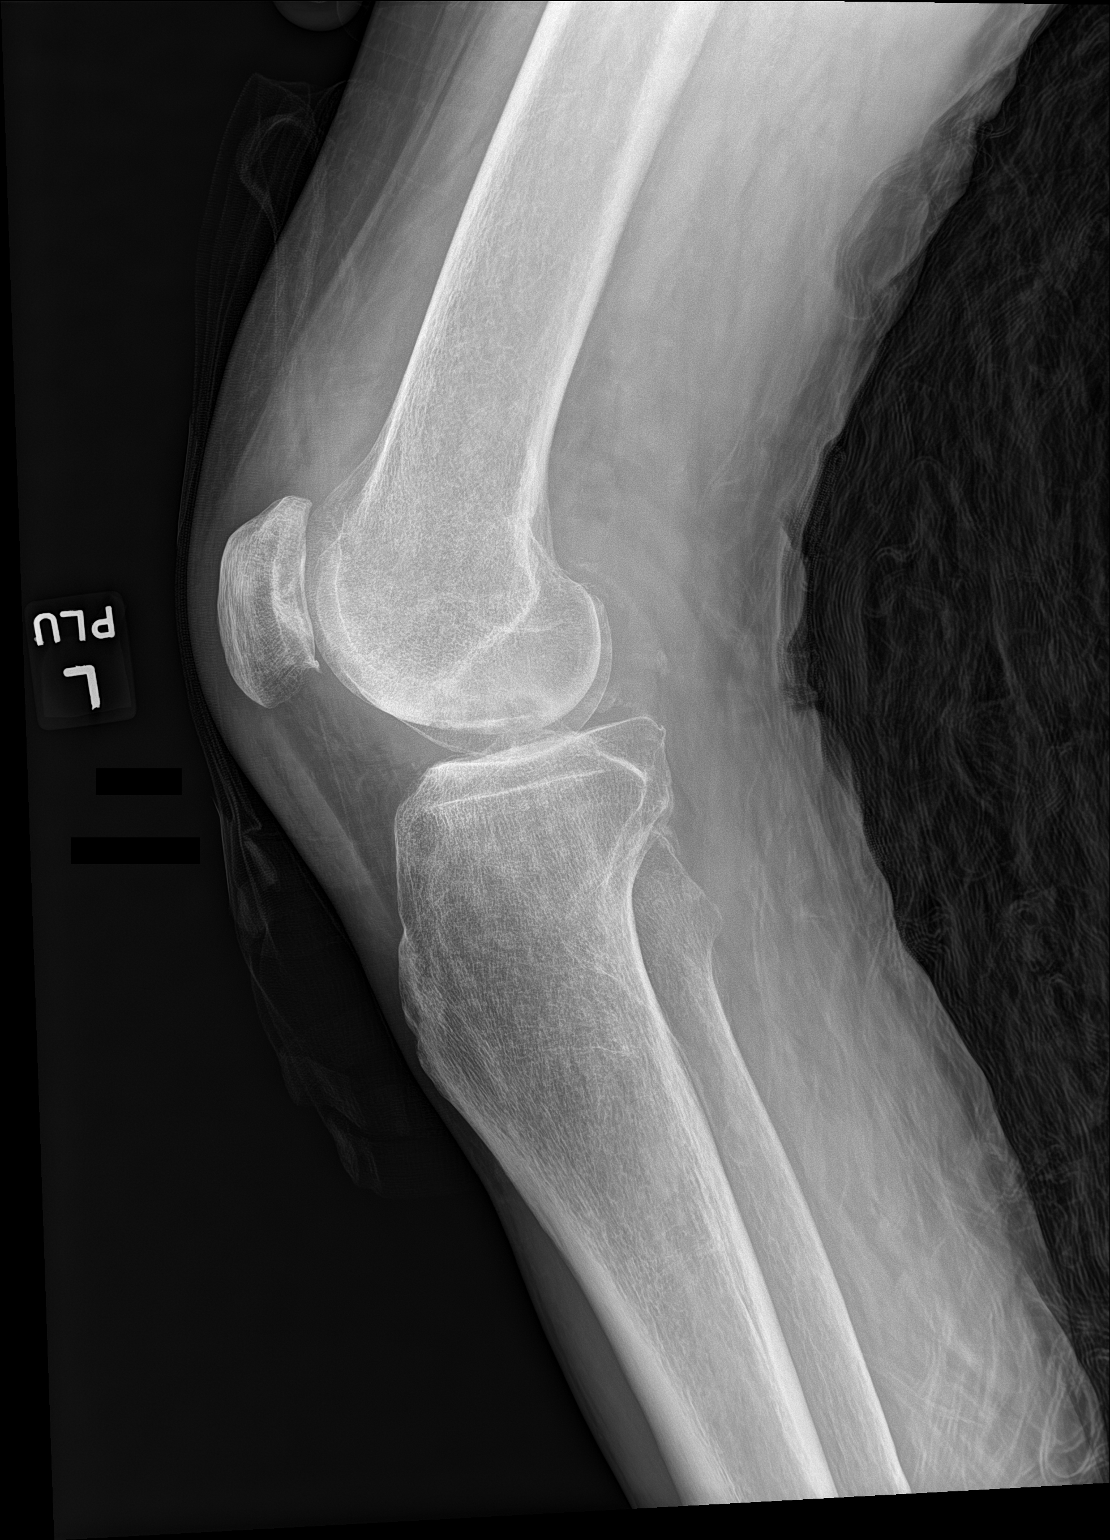

[2 of 2 positions shown; findings below may reference images not displayed]

FINDINGS: Portable AP and cross-table lateral views at 9791 hours. No definite
joint effusion. Abnormal sclerosis and lucency of the medial right
femoral condyle articular surface with partial concave collapse.
Associated medial compartment joint space loss and spurring. Mild
patellofemoral compartment spurring. No superimposed acute osseous
abnormality elsewhere.
IMPRESSION: Medial femoral condyle osteochondral defect and medial joint space
loss.

## 2021-12-16 ENCOUNTER — Other Ambulatory Visit: Payer: Self-pay

## 2021-12-18 ENCOUNTER — Ambulatory Visit (INDEPENDENT_AMBULATORY_CARE_PROVIDER_SITE_OTHER): Payer: Medicare HMO

## 2021-12-18 ENCOUNTER — Encounter: Payer: Self-pay | Admitting: Orthopaedic Surgery

## 2021-12-18 ENCOUNTER — Ambulatory Visit (INDEPENDENT_AMBULATORY_CARE_PROVIDER_SITE_OTHER): Payer: Medicare HMO | Admitting: Orthopaedic Surgery

## 2021-12-18 DIAGNOSIS — Z96652 Presence of left artificial knee joint: Secondary | ICD-10-CM | POA: Diagnosis not present

## 2021-12-18 NOTE — Progress Notes (Signed)
? ?Post-Op Visit Note ?  ?Patient: Kerry Matthews           ?Date of Birth: 1936/06/29           ?MRN: [id number] ?Visit Date: 12/18/2021 ?PCP: [name], MD ? ? ?Assessment & Plan: ? ?Chief Complaint:  ?Chief Complaint  ?Patient presents with  ? Left Knee - Pain  ? ?Visit Diagnoses:  ?1. Hx of total knee replacement, left   ? ? ?Plan:  Patient comes in one year status post left total knee replacement, date of surgery 12/17/20. He is doing well without complaints.  Left knee exam shows range of motion from 0-120 degrees.  Stable to valgus and varus stress.  At this point, he will continue to advance without activity.  Dental prophylaxis reinforced.  Follow up in one year for repeat evaluation and two view xrays of the left knee.   ? ?Follow-Up Instructions: Return in about 1 year (around 12/19/2022).  ? ?Orders:  ?Orders Placed This Encounter  ?Procedures  ? XR Knee 1-2 Views Left  ? ?No orders of the defined types were placed in this encounter. ? ? ?Imaging: ?XR Knee 1-2 Views Left ? ?Result Date: 12/18/2021 ?Well seated prosthesis without complication  ? ?PMFS History: ?Patient Active Problem List  ? Diagnosis Date Noted  ? Respiratory disorder concurrent with and due to microscopic polyangiitis ([place]) 08/12/2021  ? Hyponatremia 08/12/2021  ? Mixed diabetic hyperlipidemia associated with type 2 diabetes mellitus ([place]) 08/12/2021  ? Diabetic polyneuropathy associated with type 2 diabetes mellitus ([place]) 08/12/2021  ? Chronic kidney disease, stage 3a ([place]) 08/12/2021  ? Type 2 diabetes mellitus with hyperglycemia, with long-term current use of insulin ([place]) 04/23/2021  ? History of kidney stones 03/29/2021  ? 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 Microscopic polyangiitis ([place]) 03/29/2021  ? Rheumatoid factor positive 03/29/2021  ? Vasculitis ([place]) 03/29/2021  ? 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 Hematuria 09/07/2020  ? Proteinuria 09/07/2020  ? Renal insufficiency 09/06/2020  ? Coronary  artery disease   ? GAD (generalized anxiety disorder)   ? Osteoarthritis   ? Other emphysema ([place])   ? Primary insomnia   ? Skin cancer   ? UC (ulcerative colitis) ([place])   ? UTI (urinary tract infection)   ? Medicare annual wellness visit, subsequent 08/09/2020  ? Leukocytosis 03/08/2020  ? Peripheral polyneuropathy 01/30/2020  ? Body mass index (BMI) 29.0-29.9, adult 01/05/2020  ? Acute pain of left knee 11/30/2019  ? Alpha-1-antitrypsin deficiency carrier 05/04/2019  ? Sepsis ([place]) 04/19/2019  ? Elevated rheumatoid factor 04/12/2019  ? Interstitial pulmonary disease ([place]) 04/12/2019  ? Abnormal ANCA test 04/12/2019  ? Abnormal findings on diagnostic imaging of lung 04/12/2019  ? Polymyositis ([place])   ? Low vitamin B12 level 04/03/2019  ? Abnormal CT of the chest   ? Hypertensive heart disease without heart failure 05/03/2018  ? Mixed hyperlipidemia 05/03/2018  ? Coronary artery disease involving native coronary artery of native heart without angina pectoris 05/03/2018  ? GERD without esophagitis 05/03/2018  ? Trigger finger 05/03/2018  ? Bradycardia 05/03/2018  ? CKD (chronic kidney disease) 05/03/2018  ? Insomnia 05/03/2018  ? ?Past Medical History:  ?Diagnosis Date  ? Abnormal ANCA test 04/12/2019  ? 04/02/2019- MPO/PR-3  ANCA antibodies- myeloperoxidase ABS-greater than 100, ANCA proteinase 3-less than 3.5 04/02/2019- ANCA titers- p-ANCA +1: 160, C ANCA-less than 1: 20, atypical p-ANCA titer-less than 1: 20  ? Abnormal CT of the chest   ? Abnormal  findings on diagnostic imaging of lung 04/12/2019  ? 04/01/2019-CT chest with contrast- no acute process in chest abdomen or pelvis, interstitial lung disease suspicious for early or mild UIP, pulmonary artery enlargement suggest PAH   ? Acute low back pain without sciatica 03/08/2020  ? Acute low back pain without sciatica 03/08/2020  ? Acute lower UTI 07/07/2020  ? Acute pain of left knee 11/30/2019  ? AKI (acute kidney injury) ([place])   ? Alpha-1-antitrypsin deficiency  carrier 05/04/2019  ? Anemia   ? Atherosclerotic heart disease of native coronary artery without angina pectoris 05/03/2018  ? Body mass index (BMI) 29.0-29.9, adult 01/05/2020  ? Bradycardia   ? Burning sensation of feet 01/05/2020  ? CKD (chronic kidney disease) 05/03/2018  ? Community acquired pneumonia   ? Community acquired pneumonia   ? Coronary artery disease   ? Cough   ? COVID-19 virus infection 08/12/2021  ? Diabetes ([place])   ? Diabetes mellitus type 2 in obese ([place]) 04/01/2019  ? Dyslipidemia associated with type 2 diabetes mellitus ([place]) 05/03/2018  ? Dysuria 09/07/2020  ? Elevated LFTs 04/01/2019  ? Elevated rheumatoid factor 04/12/2019  ? 04/03/2019-rheumatoid factor-95.4  ? Emphysema (subcutaneous) (surgical) resulting from a procedure 05/03/2018  ? Essential hypertension 05/03/2018  ? Fatigue 03/29/2021  ? Fever   ? GAD (generalized anxiety disorder)   ? GERD (gastroesophageal reflux disease)   ? Hematuria 09/07/2020  ? History of alpha-1-antitrypsin deficiency 04/12/2019  ? History of kidney stones   ? Hyperlipidemia   ? Hypoxemia   ? Idiopathic pulmonary fibrosis ([place]) 03/29/2021  ? 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 Insomnia 05/03/2018  ? Interstitial pulmonary disease ([place]) 04/12/2019  ? 04/01/2019-CT chest with contrast- no acute process in chest abdomen or pelvis, interstitial lung disease suspicious for early or mild UIP, pulmonary artery enlargement suggest PAH  04/02/2019-connective tissue work-up: Anti-Jo 1-negative Anti-DNA antibody double-stranded-negative Anti-scleroderma antibody-negative Sjogren's syndrome antibody-negative Sjogren's syndrome antibody-negative CK-31 CCP-6 E  ? Leukocytosis 03/08/2020  ? Low vitamin B12 level 04/03/2019  ? Lower extremity edema   ? Medicare annual wellness visit, subsequent 08/09/2020  ? Microscopic polyangiitis ([place]) 03/29/2021  ? Mixed hyperlipidemia 05/03/2018  ? Myalgia 04/19/2019  ? Osteoarthritis   ? Other emphysema ([place])   ? Other long term  (current) drug therapy 03/29/2021  ? Paresthesias in left hand 11/30/2019  ? Peripheral polyneuropathy 01/30/2020  ? Pneumonia 04/01/2019  ? Pneumonia of both lungs due to infectious organism 08/12/2021  ? Pneumonitis   ? Pneumonitis   ? Polymyositis ([place])   ? Primary insomnia   ? Proteinuria 09/07/2020  ? Renal insufficiency 09/06/2020  ? Renal stones   ? Rheumatoid factor positive 03/29/2021  ? Sepsis ([place]) 04/19/2019  ? Severe sepsis ([place]) 07/07/2020  ? Skin cancer   ? 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 Transaminitis   ? Trigger finger 05/03/2018  ? UC (ulcerative colitis) ([place])   ? Urinary urgency 09/07/2020  ? UTI (urinary tract infection)   ? Vasculitis ([place]) 03/29/2021  ?  ?Family History  ?Problem Relation Age of Onset  ? Tuberculosis Mother   ? Stroke Father   ? Pancreatic cancer Sister   ? Heart attack Sister   ? Lung disease Sister   ? Clotting disorder Brother   ? Colon cancer Neg Hx   ? Esophageal cancer Neg Hx   ?  ?Past Surgical History:  ?Procedure Laterality Date  ? ANGIOPLASTY  2010  ? BACK SURGERY    ?  CATARACT EXTRACTION    ? COLONOSCOPY  06/16/2005  ? Mild colitis involving splenic flexure. Colonic polyps, status post polypectomy. Mild pancolonic diverticulitits. Internal hemorrhoids.   ? ESOPHAGOGASTRODUODENOSCOPY  04/26/2003  ? Irregular Z line suggestive of GERD. Mild gastritis status post CLO testing.   ? TOTAL KNEE ARTHROPLASTY Left 12/17/2020  ? Procedure: LEFT TOTAL KNEE ARTHROPLASTY;  Surgeon: Leandrew Koyanagi, MD;  Location: [facility];  Service: Orthopedics;  Laterality: Left;  ? TRIGGER FINGER RELEASE    ? ?Social History  ? ?Occupational History  ? Occupation: retired  ?Tobacco Use  ? Smoking status: Never  ? Smokeless tobacco: Never  ?Vaping Use  ? Vaping Use: Never used  ?Substance and Sexual Activity  ? Alcohol use: Never  ? Drug use: Never  ? Sexual activity: Not on file  ? ? ? ?

## 2021-12-19 ENCOUNTER — Other Ambulatory Visit: Payer: Self-pay | Admitting: Family Medicine

## 2021-12-19 ENCOUNTER — Telehealth: Payer: Self-pay | Admitting: *Deleted

## 2021-12-19 DIAGNOSIS — C44529 Squamous cell carcinoma of skin of other part of trunk: Secondary | ICD-10-CM | POA: Diagnosis not present

## 2021-12-19 NOTE — Telephone Encounter (Signed)
Ortho bundle 1 year call completed. ?

## 2021-12-24 ENCOUNTER — Other Ambulatory Visit: Payer: Self-pay

## 2021-12-24 MED ORDER — BD SWAB SINGLE USE REGULAR PADS: 1.0000 | MEDICATED_PAD | Freq: Four times a day (QID) | 3 refills | Status: DC

## 2021-12-25 ENCOUNTER — Other Ambulatory Visit: Payer: Self-pay

## 2021-12-25 DIAGNOSIS — R600 Localized edema: Secondary | ICD-10-CM | POA: Insufficient documentation

## 2021-12-25 MED ORDER — BD SWAB SINGLE USE REGULAR PADS
1.0000 | MEDICATED_PAD | Freq: Four times a day (QID) | 3 refills | Status: DC
Start: 2021-12-25 — End: 2022-09-13

## 2021-12-25 NOTE — Progress Notes (Signed)
? ?Subjective:  ?Patient ID: Kerry Matthews, male    DOB: 1935/08/31  Age: 86 y.o. MRN: [id number] ? ?Chief Complaint  ?Patient presents with  ? Diabetes  ? Hyperlipidemia  ? Hypertension  ? ? ?HPI: ?Diabetes:  ?Complications: neuropathy ?Glucose checking: before meals and before bed  ?Glucose logs: AM: 80-100s (has had a 56 and a 72), Lunch 120-180, Supper 142-258, before bed 85-195.  ?Hypoglycemia: yes. If sugar is less than 90 before bed, the patient will take  ?Most recent A1C: 6.3 ?Current medications: Novolog ssi before lunch and supper. and lantus 20 U in am and 8 U at night.  ?Patient has been tapering off prednisone and is on 10 mg daily. gabapentin 400 mg one twice a day helps with neuropathy. ?Last Eye Exam: 04/02/2021 ?Foot checks: daily. Saw [facility] and had his toenails trimmed.  ?  ?Hyperlipidemia: ?Current medications: rosuvastatin 20 mg once daily and coenzyme q10.  ?  ?Hypertensive heart disease. Has history of stent  ?Current medications: lasix 20 mg once daily, aspirin 81 mg daily, and crestor 20 mg once daily.   ?  ?Diet: eating healthy ?Exercise: active. Yardwork. Exercises inside also.   ?  ?Microscopic polyangitis: pulmonary and rheumatology. on rituxin 100 mg every 6 months.  ? 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 Sees pulmonology.Marland Kitchen wears oxygen at night 1 L. Wears during the day and uses it sometimes.  ? ?B12 deficiency: otc b12 1000 mcg once daily.  ?  ?Current Outpatient Medications on File Prior to Visit  ?Medication Sig Dispense Refill  ? Alcohol Swabs (B-D SINGLE USE SWABS REGULAR) PADS 1 each by Does not apply route in the morning, at noon, in the evening, and at bedtime. 300 each 3  ? aspirin EC 81 MG tablet Take 81 mg by mouth daily. Swallow whole.    ? Coenzyme Q10 100 MG TABS Take 100 mg by mouth at bedtime.    ? furosemide (LASIX) 20 MG tablet Take 1 tablet (20 mg total) by mouth daily as needed. 90 tablet 1  ? gabapentin (NEURONTIN) 400 MG capsule Take 400 mg  by mouth 2 (two) times daily.    ? glucose blood (TRUE METRIX BLOOD GLUCOSE TEST) test strip TEST BLOOD SUGAR THREE TIMES DAILY BEFORE MEALS 300 strip 3  ? LANTUS SOLOSTAR 100 UNIT/ML Solostar Pen Inject 20 Units into the skin 2 (two) times daily. 15 mL 1  ? Multiple Vitamins-Minerals (PRESERVISION AREDS 2) CAPS Take 1 capsule by mouth 2 (two) times daily after a meal.    ? nitroGLYCERIN (NITROSTAT) 0.4 MG SL tablet Place 1 tablet (0.4 mg total) under the tongue every 5 (five) minutes as needed for chest pain. 25 tablet 6  ? OXYGEN Inhale 1 L into the lungs daily. Using as needed during the day and continuous at night.    ? rosuvastatin (CRESTOR) 20 MG tablet Take 1 tablet (20 mg total) by mouth daily. 90 tablet 3  ? vitamin B-12 (CYANOCOBALAMIN) 1000 MCG tablet Take 1 tablet (1,000 mcg total) by mouth daily. 30 tablet 0  ? insulin aspart (NOVOLOG FLEXPEN) 100 UNIT/ML FlexPen Up to 16 U per day Check sugar prior to each meal and give dose below. 100 - 150: 4 U. 151 - 200: + 2 U (6 U) 201 - 250: + 4 U (8 U) 251 - 300: + 6 U (10 U) 301 - 350: + 8 U (12 U) 350 - 400: + 10 U (14 U) > 400: + 12 U (16U) 15  mL 11  ? omeprazole (PRILOSEC) 20 MG capsule Take 1 capsule (20 mg total) by mouth daily. 90 capsule 1  ? predniSONE (DELTASONE) 10 MG tablet Take 10 mg by mouth daily with breakfast.    ? ?No current facility-administered medications on file prior to visit.  ? ?Past Medical History:  ?Diagnosis Date  ? Abnormal ANCA test 04/12/2019  ? 04/02/2019- MPO/PR-3  ANCA antibodies- myeloperoxidase ABS-greater than 100, ANCA proteinase 3-less than 3.5 04/02/2019- ANCA titers- p-ANCA +1: 160, C ANCA-less than 1: 20, atypical p-ANCA titer-less than 1: 20  ? Abnormal CT of the chest   ? Abnormal findings on diagnostic imaging of lung 04/12/2019  ? 04/01/2019-CT chest with contrast- no acute process in chest abdomen or pelvis, interstitial lung disease suspicious for early or mild UIP, pulmonary artery enlargement suggest PAH   ? Acute  pain of left knee 11/30/2019  ? Alpha-1-antitrypsin deficiency carrier 05/04/2019  ? Atherosclerotic heart disease of native coronary artery without angina pectoris 05/03/2018  ? Body mass index (BMI) 29.0-29.9, adult 01/05/2020  ? Bradycardia   ? Chronic kidney disease, stage 3a ([place]) 08/12/2021  ? CKD (chronic kidney disease) 05/03/2018  ? Coronary artery disease   ? Coronary artery disease involving native coronary artery of native heart without angina pectoris 05/03/2018  ? Diabetes mellitus type 2 in obese ([place]) 04/01/2019  ? Diabetic polyneuropathy associated with type 2 diabetes mellitus ([place]) 08/12/2021  ? Dyslipidemia associated with type 2 diabetes mellitus ([place]) 05/03/2018  ? Elevated rheumatoid factor 04/12/2019  ? 04/03/2019-rheumatoid factor-95.4  ? Essential hypertension 05/03/2018  ? GAD (generalized anxiety disorder)   ? GERD without esophagitis 05/03/2018  ? Hematuria 09/07/2020  ? History of kidney stones   ? Hypertensive heart disease without heart failure 05/03/2018  ? Hyponatremia 08/12/2021  ? Idiopathic pulmonary fibrosis ([place]) 03/29/2021  ? 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 Insomnia 05/03/2018  ? Interstitial pulmonary disease ([place]) 04/12/2019  ? 04/01/2019-CT chest with contrast- no acute process in chest abdomen or pelvis, interstitial lung disease suspicious for early or mild UIP, pulmonary artery enlargement suggest PAH  04/02/2019-connective tissue work-up: Anti-Jo 1-negative Anti-DNA antibody double-stranded-negative Anti-scleroderma antibody-negative Sjogren's syndrome antibody-negative Sjogren's syndrome antibody-negative CK-31 CCP-6 E  ? Leukocytosis 03/08/2020  ? Low vitamin B12 level 04/03/2019  ? Lower extremity edema   ? Medicare annual wellness visit, subsequent 08/09/2020  ? Microscopic polyangiitis ([place]) 03/29/2021  ? Mixed diabetic hyperlipidemia associated with type 2 diabetes mellitus ([place]) 08/12/2021  ? Mixed hyperlipidemia 05/03/2018  ? Myalgia 04/19/2019  ?  Osteoarthritis   ? Other emphysema ([place])   ? Other long term (current) drug therapy 03/29/2021  ? Peripheral polyneuropathy 01/30/2020  ? Polymyositis ([place])   ? Primary insomnia   ? Proteinuria 09/07/2020  ? Renal insufficiency 09/06/2020  ? Respiratory disorder concurrent with and due to microscopic polyangiitis ([place]) 08/12/2021  ? Rheumatoid factor positive 03/29/2021  ? Sepsis ([place]) 04/19/2019  ? Skin cancer   ? 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 Transaminitis   ? Trigger finger 05/03/2018  ? Type 2 diabetes mellitus with hyperglycemia, with long-term current use of insulin ([place]) 04/23/2021  ? UC (ulcerative colitis) ([place])   ? UTI (urinary tract infection)   ? Vasculitis ([place]) 03/29/2021  ? ?Past Surgical History:  ?Procedure Laterality Date  ? ANGIOPLASTY  2010  ? BACK SURGERY    ? CATARACT EXTRACTION    ? COLONOSCOPY  06/16/2005  ? Mild colitis involving splenic flexure. Colonic polyps, status  post polypectomy. Mild pancolonic diverticulitits. Internal hemorrhoids.   ? ESOPHAGOGASTRODUODENOSCOPY  04/26/2003  ? Irregular Z line suggestive of GERD. Mild gastritis status post CLO testing.   ? TOTAL KNEE ARTHROPLASTY Left 12/17/2020  ? Procedure: LEFT TOTAL KNEE ARTHROPLASTY;  Surgeon: Leandrew Koyanagi, MD;  Location: [facility];  Service: Orthopedics;  Laterality: Left;  ? TRIGGER FINGER RELEASE    ?  ?Family History  ?Problem Relation Age of Onset  ? Tuberculosis Mother   ? Stroke Father   ? Pancreatic cancer Sister   ? Heart attack Sister   ? Lung disease Sister   ? Clotting disorder Brother   ? Colon cancer Neg Hx   ? Esophageal cancer Neg Hx   ? ?Social History  ? ?Socioeconomic History  ? Marital status: Married  ?  Spouse name: Not on file  ? Number of children: 2  ? Years of education: Not on file  ? Highest education level: Not on file  ?Occupational History  ? Occupation: retired  ?Tobacco Use  ? Smoking status: Never  ? Smokeless tobacco: Never  ?Vaping Use  ? Vaping Use: Never used  ?Substance  and Sexual Activity  ? Alcohol use: Never  ? Drug use: Never  ? Sexual activity: Not on file  ?Other Topics Concern  ? Not on file  ?Social History Narrative  ? Not on file  ? ?Social Determinants of Health  ?

## 2021-12-26 ENCOUNTER — Ambulatory Visit (INDEPENDENT_AMBULATORY_CARE_PROVIDER_SITE_OTHER): Payer: Medicare HMO | Admitting: Cardiology

## 2021-12-26 ENCOUNTER — Ambulatory Visit (INDEPENDENT_AMBULATORY_CARE_PROVIDER_SITE_OTHER): Payer: Medicare HMO | Admitting: Family Medicine

## 2021-12-26 ENCOUNTER — Encounter: Payer: Self-pay | Admitting: Cardiology

## 2021-12-26 ENCOUNTER — Encounter: Payer: Self-pay | Admitting: Family Medicine

## 2021-12-26 VITALS — BP 138/74 | HR 80 | Ht 66.0 in | Wt 189.8 lb

## 2021-12-26 VITALS — BP 116/64 | HR 72 | Temp 97.1°F | Resp 18 | Ht 66.0 in | Wt 189.0 lb

## 2021-12-26 DIAGNOSIS — R300 Dysuria: Secondary | ICD-10-CM

## 2021-12-26 DIAGNOSIS — E782 Mixed hyperlipidemia: Secondary | ICD-10-CM

## 2021-12-26 DIAGNOSIS — I2510 Atherosclerotic heart disease of native coronary artery without angina pectoris: Secondary | ICD-10-CM

## 2021-12-26 DIAGNOSIS — E1142 Type 2 diabetes mellitus with diabetic polyneuropathy: Secondary | ICD-10-CM

## 2021-12-26 DIAGNOSIS — J84112 Idiopathic pulmonary fibrosis: Secondary | ICD-10-CM | POA: Diagnosis not present

## 2021-12-26 DIAGNOSIS — I119 Hypertensive heart disease without heart failure: Secondary | ICD-10-CM

## 2021-12-26 DIAGNOSIS — I10 Essential (primary) hypertension: Secondary | ICD-10-CM | POA: Diagnosis not present

## 2021-12-26 DIAGNOSIS — E1169 Type 2 diabetes mellitus with other specified complication: Secondary | ICD-10-CM

## 2021-12-26 DIAGNOSIS — M317 Microscopic polyangiitis: Secondary | ICD-10-CM

## 2021-12-26 LAB — POCT URINALYSIS DIPSTICK
Bilirubin, UA: NEGATIVE
Blood, UA: NEGATIVE
Glucose, UA: NEGATIVE
Ketones, UA: NEGATIVE
Leukocytes, UA: NEGATIVE
Nitrite, UA: NEGATIVE
Protein, UA: NEGATIVE
Spec Grav, UA: 1.01 (ref 1.010–1.025)
Urobilinogen, UA: 0.2 E.U./dL
pH, UA: 5.5 (ref 5.0–8.0)

## 2021-12-26 MED ORDER — DROPLET PEN NEEDLES 31G X 8 MM MISC: 3 refills | Status: DC

## 2021-12-26 NOTE — Patient Instructions (Signed)

## 2021-12-26 NOTE — Progress Notes (Signed)
?Cardiology Office Note:   ? ?Date:  12/26/2021  ? ?ID:  Kerry Matthews, DOB [birth year], MRN [id number] ? ?PCP:  [name], MD  ?Cardiologist:  Jenean Lindau, MD  ? ?Referring MD: [name], MD  ? ? ?ASSESSMENT:   ? ?1. Coronary artery disease involving native coronary artery of native heart without angina pectoris   ?2. Mixed hyperlipidemia   ?3. Essential hypertension   ?4. Mixed diabetic hyperlipidemia associated with type 2 diabetes mellitus ([place])   ? ?PLAN:   ? ?In order of problems listed above: ? ?Coronary artery disease: Secondary prevention stressed with the patient.  Importance of compliance with diet and medication stressed and he vocalized understanding.  He was advised to walk at least half an hour a day 5 days a week and he promises to do so. ?Essential hypertension: Blood pressure stable and diet was emphasized.  Lifestyle modification urged. ?Mixed hyperlipidemia: On statin therapy.  Lipids followed by primary care.  He had blood work done today and they will advise him accordingly.  Goal LDL must be less than 70. ?Obesity: Weight reduction stressed and diet was emphasized. ?Patient will be seen in follow-up appointment in 6 months or earlier if the patient has any concerns ? ? ? ?Medication Adjustments/Labs and Tests Ordered: ?Current medicines are reviewed at length with the patient today.  Concerns regarding medicines are outlined above.  ?No orders of the defined types were placed in this encounter. ? ?No orders of the defined types were placed in this encounter. ? ? ? ?No chief complaint on file. ?  ? ?History of Present Illness:   ? ?Kerry Matthews is a 86 y.o. male.  Patient has past medical history of coronary artery disease, essential hypertension, dyslipidemia.  He denies any problems at this time and takes care of activities of daily living.  No chest pain orthopnea or PND.  He sees a pulmonologist for his lung issues related to alpha-1 antitrypsin ? ?Past Medical History:   ?Diagnosis Date  ? Abnormal ANCA test 04/12/2019  ? 04/02/2019- MPO/PR-3  ANCA antibodies- myeloperoxidase ABS-greater than 100, ANCA proteinase 3-less than 3.5 04/02/2019- ANCA titers- p-ANCA +1: 160, C ANCA-less than 1: 20, atypical p-ANCA titer-less than 1: 20  ? Abnormal CT of the chest   ? Abnormal findings on diagnostic imaging of lung 04/12/2019  ? 04/01/2019-CT chest with contrast- no acute process in chest abdomen or pelvis, interstitial lung disease suspicious for early or mild UIP, pulmonary artery enlargement suggest PAH   ? Acute pain of left knee 11/30/2019  ? Alpha-1-antitrypsin deficiency carrier 05/04/2019  ? Atherosclerotic heart disease of native coronary artery without angina pectoris 05/03/2018  ? Body mass index (BMI) 29.0-29.9, adult 01/05/2020  ? Bradycardia   ? Chronic kidney disease, stage 3a ([place]) 08/12/2021  ? CKD (chronic kidney disease) 05/03/2018  ? Coronary artery disease   ? Coronary artery disease involving native coronary artery of native heart without angina pectoris 05/03/2018  ? Diabetes mellitus type 2 in obese ([place]) 04/01/2019  ? Diabetic polyneuropathy associated with type 2 diabetes mellitus ([place]) 08/12/2021  ? Dyslipidemia associated with type 2 diabetes mellitus ([place]) 05/03/2018  ? Elevated rheumatoid factor 04/12/2019  ? 04/03/2019-rheumatoid factor-95.4  ? Essential hypertension 05/03/2018  ? GAD (generalized anxiety disorder)   ? GERD without esophagitis 05/03/2018  ? Hematuria 09/07/2020  ? History of kidney stones   ? Hypertensive heart disease without heart failure 05/03/2018  ? Hyponatremia 08/12/2021  ? Idiopathic pulmonary fibrosis ([place]) 03/29/2021  ?  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 Insomnia 05/03/2018  ? Interstitial pulmonary disease ([place]) 04/12/2019  ? 04/01/2019-CT chest with contrast- no acute process in chest abdomen or pelvis, interstitial lung disease suspicious for early or mild UIP, pulmonary artery enlargement suggest PAH  04/02/2019-connective tissue  work-up: Anti-Jo 1-negative Anti-DNA antibody double-stranded-negative Anti-scleroderma antibody-negative Sjogren's syndrome antibody-negative Sjogren's syndrome antibody-negative CK-31 CCP-6 E  ? Leukocytosis 03/08/2020  ? Low vitamin B12 level 04/03/2019  ? Lower extremity edema   ? Medicare annual wellness visit, subsequent 08/09/2020  ? Microscopic polyangiitis ([place]) 03/29/2021  ? Mixed diabetic hyperlipidemia associated with type 2 diabetes mellitus ([place]) 08/12/2021  ? Mixed hyperlipidemia 05/03/2018  ? Myalgia 04/19/2019  ? Osteoarthritis   ? Other emphysema ([place])   ? Other long term (current) drug therapy 03/29/2021  ? Peripheral polyneuropathy 01/30/2020  ? Polymyositis ([place])   ? Primary insomnia   ? Proteinuria 09/07/2020  ? Renal insufficiency 09/06/2020  ? Respiratory disorder concurrent with and due to microscopic polyangiitis ([place]) 08/12/2021  ? Rheumatoid factor positive 03/29/2021  ? Sepsis ([place]) 04/19/2019  ? Skin cancer   ? 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 Transaminitis   ? Trigger finger 05/03/2018  ? Type 2 diabetes mellitus with hyperglycemia, with long-term current use of insulin ([place]) 04/23/2021  ? UC (ulcerative colitis) ([place])   ? UTI (urinary tract infection)   ? Vasculitis ([place]) 03/29/2021  ? ? ?Past Surgical History:  ?Procedure Laterality Date  ? ANGIOPLASTY  2010  ? BACK SURGERY    ? CATARACT EXTRACTION    ? COLONOSCOPY  06/16/2005  ? Mild colitis involving splenic flexure. Colonic polyps, status post polypectomy. Mild pancolonic diverticulitits. Internal hemorrhoids.   ? ESOPHAGOGASTRODUODENOSCOPY  04/26/2003  ? Irregular Z line suggestive of GERD. Mild gastritis status post CLO testing.   ? TOTAL KNEE ARTHROPLASTY Left 12/17/2020  ? Procedure: LEFT TOTAL KNEE ARTHROPLASTY;  Surgeon: Leandrew Koyanagi, MD;  Location: [facility];  Service: Orthopedics;  Laterality: Left;  ? TRIGGER FINGER RELEASE    ? ? ?Current Medications: ?Current Meds  ?Medication Sig  ? insulin aspart  (NOVOLOG FLEXPEN) 100 UNIT/ML FlexPen Up to 16 U per day Check sugar prior to each meal and give dose below. 100 - 150: 4 U. 151 - 200: + 2 U (6 U) 201 - 250: + 4 U (8 U) 251 - 300: + 6 U (10 U) 301 - 350: + 8 U (12 U) 350 - 400: + 10 U (14 U) > 400: + 12 U (16U)  ? LANTUS SOLOSTAR 100 UNIT/ML Solostar Pen Inject 20 Units into the skin 2 (two) times daily.  ? omeprazole (PRILOSEC) 20 MG capsule Take 1 capsule (20 mg total) by mouth daily.  ? predniSONE (DELTASONE) 10 MG tablet Take 10 mg by mouth daily with breakfast.  ? rosuvastatin (CRESTOR) 20 MG tablet Take 1 tablet (20 mg total) by mouth daily.  ? vitamin B-12 (CYANOCOBALAMIN) 1000 MCG tablet Take 1 tablet (1,000 mcg total) by mouth daily.  ?  ? ?Allergies:   Ace inhibitors, Hydrocodone, Hydrocodone-acetaminophen, Nsaids, Sulfamethoxazole, Sulfamethoxazole-trimethoprim, and Trimethoprim  ? ?Social History  ? ?Socioeconomic History  ? Marital status: Married  ?  Spouse name: Not on file  ? Number of children: 2  ? Years of education: Not on file  ? Highest education level: Not on file  ?Occupational History  ? Occupation: retired  ?Tobacco Use  ? Smoking status: Never  ? Smokeless tobacco: Never  ?Vaping  Use  ? Vaping Use: Never used  ?Substance and Sexual Activity  ? Alcohol use: Never  ? Drug use: Never  ? Sexual activity: Not on file  ?Other Topics Concern  ? Not on file  ?Social History Narrative  ? Not on file  ? ?Social Determinants of Health  ? ?Financial Resource Strain: Low Risk   ? Difficulty of Paying Living Expenses: Not hard at all  ?Food Insecurity: Not on file  ?Transportation Needs: No Transportation Needs  ? Lack of Transportation (Medical): No  ? Lack of Transportation (Non-Medical): No  ?Physical Activity: Not on file  ?Stress: Not on file  ?Social Connections: Not on file  ?  ? ?Family History: ?The patient's family history includes Clotting disorder in his brother; Heart attack in his sister; Lung disease in his sister; Pancreatic cancer in  his sister; Stroke in his father; Tuberculosis in his mother. There is no history of Colon cancer or Esophageal cancer. ? ?ROS:   ?Please see the history of present illness.    ?All other systems reviewed and are n

## 2021-12-27 LAB — CBC WITH DIFF/PLATELET
Basophils Absolute: 0.1 10*3/uL (ref 0.0–0.2)
Basos: 1 %
EOS (ABSOLUTE): 0.6 10*3/uL — ABNORMAL HIGH (ref 0.0–0.4)
Eos: 4 %
Hematocrit: 45.7 % (ref 37.5–51.0)
Hemoglobin: 15.5 g/dL (ref 13.0–17.7)
Immature Grans (Abs): 0.3 10*3/uL — ABNORMAL HIGH (ref 0.0–0.1)
Immature Granulocytes: 2 %
Lymphocytes Absolute: 3.5 10*3/uL — ABNORMAL HIGH (ref 0.7–3.1)
Lymphs: 23 %
MCH: 29.4 pg (ref 26.6–33.0)
MCHC: 33.9 g/dL (ref 31.5–35.7)
MCV: 87 fL (ref 79–97)
Monocytes Absolute: 1.2 10*3/uL — ABNORMAL HIGH (ref 0.1–0.9)
Monocytes: 8 %
Neutrophils Absolute: 9.4 10*3/uL — ABNORMAL HIGH (ref 1.4–7.0)
Neutrophils: 62 %
Platelets: 263 10*3/uL (ref 150–450)
RBC: 5.27 x10E6/uL (ref 4.14–5.80)
RDW: 13.5 % (ref 11.6–15.4)
WBC: 15.1 10*3/uL — ABNORMAL HIGH (ref 3.4–10.8)

## 2021-12-27 LAB — LIPID PANEL
Chol/HDL Ratio: 2.7 ratio (ref 0.0–5.0)
Cholesterol, Total: 162 mg/dL (ref 100–199)
HDL: 59 mg/dL (ref >39)
LDL Chol Calc (NIH): 87 mg/dL (ref 0–99)
Triglycerides: 85 mg/dL (ref 0–149)
VLDL Cholesterol Cal: 16 mg/dL (ref 5–40)

## 2021-12-27 LAB — COMPREHENSIVE METABOLIC PANEL
ALT: 22 IU/L (ref 0–44)
AST: 24 IU/L (ref 0–40)
Albumin/Globulin Ratio: 1.7 (ref 1.2–2.2)
Albumin: 4.1 g/dL (ref 3.6–4.6)
Alkaline Phosphatase: 102 IU/L (ref 44–121)
BUN/Creatinine Ratio: 17 (ref 10–24)
BUN: 20 mg/dL (ref 8–27)
Bilirubin Total: 0.9 mg/dL (ref 0.0–1.2)
CO2: 25 mmol/L (ref 20–29)
Calcium: 9.1 mg/dL (ref 8.6–10.2)
Chloride: 101 mmol/L (ref 96–106)
Creatinine, Ser: 1.19 mg/dL (ref 0.76–1.27)
Globulin, Total: 2.4 g/dL (ref 1.5–4.5)
Glucose: 72 mg/dL (ref 70–99)
Potassium: 4 mmol/L (ref 3.5–5.2)
Sodium: 141 mmol/L (ref 134–144)
Total Protein: 6.5 g/dL (ref 6.0–8.5)
eGFR: 60 mL/min/{1.73_m2} (ref >59)

## 2021-12-27 LAB — HEMOGLOBIN A1C
Est. average glucose Bld gHb Est-mCnc: 166 mg/dL
Hgb A1c MFr Bld: 7.4 % — ABNORMAL HIGH (ref 4.8–5.6)

## 2021-12-27 LAB — MICROALBUMIN / CREATININE URINE RATIO
Creatinine, Urine: 14 mg/dL
Microalb/Creat Ratio: <21 mg/g creat (ref 0–29)
Microalbumin, Urine: <3 ug/mL

## 2021-12-29 NOTE — Progress Notes (Signed)
Blood count abnormal. Wbc up due to steroids.  ?Liver function normal.  ?Kidney function normal. ?Cholesterol: good ?HBA1C: 7.4.  ?

## 2021-12-31 ENCOUNTER — Telehealth: Payer: Self-pay

## 2021-12-31 NOTE — Telephone Encounter (Signed)
Patient's wife called just to say thank you for getting the pharmacy rolling. That he received his needles and alcohol swabs yesterday. ?

## 2022-01-01 ENCOUNTER — Telehealth: Payer: Self-pay

## 2022-01-01 NOTE — Progress Notes (Signed)
? ? ?Chronic Care Management ?Pharmacy Assistant  ? ?Name: Kerry Matthews  MRN: [id number] DOB: 07/05/1936 ? ? ?Reason for Encounter: Disease State call for DM  ?  ?Recent office visits:  ?12/26/21 [name] MD. Seen for Microscopic Polyangiitis. No med changes. ? ?12/06/21 [name] NP. Seen for Odontogenic infection of jaw. Started on Augmentin 875-'125mg'$ .  ? ?Recent consult visits:  ?12/26/21 (Cardiology) [name] MD. Seen for CAD. Started on Prednisone '10mg'$ . ? ?12/18/21 (Orthopedic Surgery) Frankey Shown MD. Seen for knee replacement. No med changes. ? ?12/04/21 (Podiatry) Lorenda Peck MD. Seen for Peripheral Polyneuropathy. No med changes. ? ?Hospital visits:  ?None ? ?Medications: ?Outpatient Encounter Medications as of 01/01/2022  ?Medication Sig  ? Alcohol Swabs (B-D SINGLE USE SWABS REGULAR) PADS 1 each by Does not apply route in the morning, at noon, in the evening, and at bedtime.  ? aspirin EC 81 MG tablet Take 81 mg by mouth daily. Swallow whole.  ? Coenzyme Q10 100 MG TABS Take 100 mg by mouth at bedtime.  ? furosemide (LASIX) 20 MG tablet Take 1 tablet (20 mg total) by mouth daily as needed.  ? gabapentin (NEURONTIN) 400 MG capsule Take 400 mg by mouth 2 (two) times daily.  ? glucose blood (TRUE METRIX BLOOD GLUCOSE TEST) test strip TEST BLOOD SUGAR THREE TIMES DAILY BEFORE MEALS  ? insulin aspart (NOVOLOG FLEXPEN) 100 UNIT/ML FlexPen Up to 16 U per day Check sugar prior to each meal and give dose below. 100 - 150: 4 U. 151 - 200: + 2 U (6 U) 201 - 250: + 4 U (8 U) 251 - 300: + 6 U (10 U) 301 - 350: + 8 U (12 U) 350 - 400: + 10 U (14 U) > 400: + 12 U (16U)  ? Insulin Pen Needle (DROPLET PEN NEEDLES) 31G X 8 MM MISC Gives max of 5 insulin injections daily.  ? LANTUS SOLOSTAR 100 UNIT/ML Solostar Pen Inject 20 Units into the skin 2 (two) times daily.  ? Multiple Vitamins-Minerals (PRESERVISION AREDS 2) CAPS Take 1 capsule by mouth 2 (two) times daily after a meal.  ? nitroGLYCERIN (NITROSTAT) 0.4  MG SL tablet Place 1 tablet (0.4 mg total) under the tongue every 5 (five) minutes as needed for chest pain.  ? omeprazole (PRILOSEC) 20 MG capsule Take 1 capsule (20 mg total) by mouth daily.  ? OXYGEN Inhale 1 L into the lungs daily. Using as needed during the day and continuous at night.  ? predniSONE (DELTASONE) 10 MG tablet Take 10 mg by mouth daily with breakfast.  ? rosuvastatin (CRESTOR) 20 MG tablet Take 1 tablet (20 mg total) by mouth daily.  ? vitamin B-12 (CYANOCOBALAMIN) 1000 MCG tablet Take 1 tablet (1,000 mcg total) by mouth daily.  ? ?No facility-administered encounter medications on file as of 01/01/2022.  ? ? ?Recent Relevant Labs: ?Lab Results  ?Component Value Date/Time  ? HGBA1C 7.4 (H) 12/26/2021 08:31 AM  ? HGBA1C 6.3 (H) 08/07/2021 10:31 AM  ? MICROALBUR 10 04/11/2021 11:54 AM  ? MICROALBUR 10 03/06/2020 03:07 PM  ?  ?Kidney Function ?Lab Results  ?Component Value Date/Time  ? CREATININE 1.19 12/26/2021 08:31 AM  ? CREATININE 1.36 (H) 12/06/2021 11:07 AM  ? GFR 53.09 (L) 09/20/2021 11:21 AM  ? GFRNONAA >60 08/23/2021 01:27 AM  ? GFRAA 55 (L) 07/26/2020 09:53 AM  ? ? ? ?Current antihyperglycemic regimen:  ?Lantus Solostart 100units Inject 20 units two times daily ?Novolog 100units Up to 16  U per day ?Patient verbally confirms he is taking the above medications as directed. Yes ? ?What recent interventions/DTPs have been made to improve glycemic control:  ?Pt denies any changes  ? ?Have there been any recent hospitalizations or ED visits since last visit with CPP? No ? ?Patient denies hypoglycemic symptoms, including None ? ?Patient denies hyperglycemic symptoms, including none ? ?How often are you checking your blood sugar? 3-4 times daily ? ?What are your blood sugars ranging?  ?Fasting: 87 on 01/01/22 and 125 on 12/31/21  ?After Meal: 161 on 01/01/22, 126, 158 on 12/31/21 ?It dropped to 68 in the evening on 12/30/21 ? ?On insulin? Yes ?How many units:How many units:20 units in am and 8 in pm and  sliding scale 8 to 16 units  ? ?During the week, how often does your blood glucose drop below 70? On 12/27/21 it was 69 fasting and 12/30/21 68 in the evening.  Pt happens a few times a month. Pt just usually eats something and his sugars go back up ? ?Are you checking your feet daily/regularly? Yes ? ?Adherence Review: ?Is the patient currently on a STATIN medication? Yes ?Is the patient currently on ACE/ARB medication? No ?Does the patient have >5 day gap between last estimated fill dates? CPP to review ? ?Care Gaps: ?Last eye exam / Retinopathy Screening? 04/02/21 ?Last Annual Wellness Visit? None noted ?Last Diabetic Foot Exam? 08/06/21 ?  ?Star Rating Drugs:  ?Medication:  Last Fill: Day Supply ?None noted  ? ?[name], CMA ?Clinical Pharmacist Assistant  ?[phone]  ?

## 2022-01-01 NOTE — Telephone Encounter (Signed)
Called patient. Spoke with patient and wife. They VU of decrease with read back of changes.  ?Kerry Matthews 01/01/22 3:41 PM ? ?

## 2022-01-01 NOTE — Telephone Encounter (Addendum)
Patient experiencing hypoglycemia. Will let PCP know ? ?Patient stated happens randomly in morning and evening. States he isn't forgetting to eat either ?

## 2022-01-05 NOTE — Assessment & Plan Note (Signed)
Management per specialist.  ?Pulmonologist.  ?

## 2022-01-05 NOTE — Assessment & Plan Note (Signed)
Well controlled.  ?No changes to medicines. Continue rosuvastatin 20 mg daily. Continue coenzyme q10 daily.  ?Continue to work on eating a healthy diet and exercise.  ?Labs drawn today.  ? ?

## 2022-01-05 NOTE — Assessment & Plan Note (Addendum)
Control: A1C is good.  ?Recommend check sugars before meals and before bed. ?Recommend check feet daily. ?Recommend annual eye exams. ?Medicines: Continue Novolog ssi before lunch and supper. and lantus 20 U in am and 8 U at night. Continue gabapentin 400 mg twice daily.  ?Continue to work on eating a healthy diet and exercise.  ?Labs drawn today.   ? ?

## 2022-01-05 NOTE — Assessment & Plan Note (Signed)
UA normal.

## 2022-01-05 NOTE — Assessment & Plan Note (Signed)
The current medical regimen is effective;  continue present plan and medications. ?Continue lasix 20 mg once daily, aspirin 81 mg daily, and crestor 20 mg once daily.   ?

## 2022-01-05 NOTE — Assessment & Plan Note (Signed)
Continue rosuvastatin 20 mg daily. Continue coenzyme q10 and aspirin 81 mg daily.  ?

## 2022-01-05 NOTE — Assessment & Plan Note (Signed)
Management by pulmonology.  ?Continue rituxin 100 mg every 6 months.  ?On prednisone taper.  ?Continue oxygen 1 L daily.  ?

## 2022-01-28 ENCOUNTER — Ambulatory Visit
Admission: RE | Admit: 2022-01-28 | Discharge: 2022-01-28 | Disposition: A | Discharge status: Home / Self Care | Payer: Medicare HMO | Source: Ambulatory Visit | Attending: Pulmonary Disease | Admitting: Pulmonary Disease

## 2022-01-28 DIAGNOSIS — J479 Bronchiectasis, uncomplicated: Secondary | ICD-10-CM | POA: Diagnosis not present

## 2022-01-28 DIAGNOSIS — J849 Interstitial pulmonary disease, unspecified: Secondary | ICD-10-CM

## 2022-01-28 DIAGNOSIS — J929 Pleural plaque without asbestos: Secondary | ICD-10-CM | POA: Diagnosis not present

## 2022-01-28 DIAGNOSIS — J84112 Idiopathic pulmonary fibrosis: Secondary | ICD-10-CM | POA: Diagnosis not present

## 2022-01-28 DIAGNOSIS — I700 Atherosclerosis of aorta: Secondary | ICD-10-CM | POA: Diagnosis not present

## 2022-02-04 ENCOUNTER — Telehealth: Payer: Self-pay

## 2022-02-04 NOTE — Progress Notes (Signed)
Chronic Care Management Pharmacy Assistant   Name: Kerry Matthews  MRN: [id number] DOB: [birth year]   Reason for Encounter: Disease State call for DM    Recent office visits:  None  Recent consult visits:  None  Hospital visits:  None  Medications: Outpatient Encounter Medications as of 02/04/2022  Medication Sig   Alcohol Swabs (B-D SINGLE USE SWABS REGULAR) PADS 1 each by Does not apply route in the morning, at noon, in the evening, and at bedtime.   aspirin EC 81 MG tablet Take 81 mg by mouth daily. Swallow whole.   Coenzyme Q10 100 MG TABS Take 100 mg by mouth at bedtime.   furosemide (LASIX) 20 MG tablet Take 1 tablet (20 mg total) by mouth daily as needed.   gabapentin (NEURONTIN) 400 MG capsule Take 400 mg by mouth 2 (two) times daily.   glucose blood (TRUE METRIX BLOOD GLUCOSE TEST) test strip TEST BLOOD SUGAR THREE TIMES DAILY BEFORE MEALS   insulin aspart (NOVOLOG FLEXPEN) 100 UNIT/ML FlexPen Up to 16 U per day Check sugar prior to each meal and give dose below. 100 - 150: 4 U. 151 - 200: + 2 U (6 U) 201 - 250: + 4 U (8 U) 251 - 300: + 6 U (10 U) 301 - 350: + 8 U (12 U) 350 - 400: + 10 U (14 U) > 400: + 12 U (16U)   Insulin Pen Needle (DROPLET PEN NEEDLES) 31G X 8 MM MISC Gives max of 5 insulin injections daily.   LANTUS SOLOSTAR 100 UNIT/ML Solostar Pen Inject 20 Units into the skin 2 (two) times daily.   Multiple Vitamins-Minerals (PRESERVISION AREDS 2) CAPS Take 1 capsule by mouth 2 (two) times daily after a meal.   nitroGLYCERIN (NITROSTAT) 0.4 MG SL tablet Place 1 tablet (0.4 mg total) under the tongue every 5 (five) minutes as needed for chest pain.   omeprazole (PRILOSEC) 20 MG capsule Take 1 capsule (20 mg total) by mouth daily.   OXYGEN Inhale 1 L into the lungs daily. Using as needed during the day and continuous at night.   predniSONE (DELTASONE) 10 MG tablet Take 10 mg by mouth daily with breakfast.   rosuvastatin (CRESTOR) 20 MG tablet Take 1 tablet (20 mg  total) by mouth daily.   vitamin B-12 (CYANOCOBALAMIN) 1000 MCG tablet Take 1 tablet (1,000 mcg total) by mouth daily.   No facility-administered encounter medications on file as of 02/04/2022.    Recent Relevant Labs: Lab Results  Component Value Date/Time   HGBA1C 7.4 (H) 12/26/2021 08:31 AM   HGBA1C 6.3 (H) 08/07/2021 10:31 AM   MICROALBUR 10 04/11/2021 11:54 AM   MICROALBUR 10 03/06/2020 03:07 PM    Kidney Function Lab Results  Component Value Date/Time   CREATININE 1.19 12/26/2021 08:31 AM   CREATININE 1.36 (H) 12/06/2021 11:07 AM   GFR 53.09 (L) 09/20/2021 11:21 AM   GFRNONAA >60 08/23/2021 01:27 AM   GFRAA 55 (L) 07/26/2020 09:53 AM     Current antihyperglycemic regimen:  Lantus Solostart 100units Inject 20 units two times daily- Taking 14u in am and 6 u in pm Novolog 100units Up to 16 U per day Patient verbally confirms he is taking the above medications as directed. Yes  What recent interventions/DTPs have been made to improve glycemic control:  Dr. Tobie Poet decreased his Lantus to 14u in am and 6 u in pm. Pt is still taking '10mg'$  of Prednisone that affects sugar readings   Have  there been any recent hospitalizations or ED visits since last visit with [facility]? No  Patient reports hypoglycemic symptoms, including  Weakness   Patient denies hyperglycemic symptoms, including none  How often are you checking your blood sugar? twice daily  What are your blood sugars ranging?  Fasting: 02/03/22 96, 02/04/22 100, 02/05/22 99, 02/06/22 90 Before meals: 02/03/22 136 02/04/22 122  After meals: 02/05/22 207 Bedtime: 02/02/22 71 02/03/22 123, 02/05/22 67   On insulin? Yes How many units:14u in am and 6u in pm and sliding scale 8 to 16 units   During the week, how often does your blood glucose drop below 70? Once a week  Are you checking your feet daily/regularly? Yes  Adherence Review: Is the patient currently on a STATIN medication? Yes Is the patient currently on ACE/ARB medication?  No Does the patient have >5 day gap between last estimated fill dates? [facility] to review  Care Gaps: Last eye exam / Retinopathy Screening? 04/02/21 Last Annual Wellness Visit? None noted Last Diabetic Foot Exam? 08/06/21  Star Rating Drugs:  Medication:  Last Fill: Day Supply None noted    [name], [place] Pharmacist Assistant  [phone]

## 2022-02-05 ENCOUNTER — Ambulatory Visit (INDEPENDENT_AMBULATORY_CARE_PROVIDER_SITE_OTHER): Payer: Medicare HMO | Admitting: Nurse Practitioner

## 2022-02-05 ENCOUNTER — Other Ambulatory Visit: Payer: Self-pay | Admitting: Nurse Practitioner

## 2022-02-05 ENCOUNTER — Encounter: Payer: Self-pay | Admitting: Nurse Practitioner

## 2022-02-05 VITALS — BP 118/62 | HR 57 | Ht 66.0 in | Wt 185.0 lb

## 2022-02-05 DIAGNOSIS — M62838 Other muscle spasm: Secondary | ICD-10-CM

## 2022-02-05 DIAGNOSIS — I2510 Atherosclerotic heart disease of native coronary artery without angina pectoris: Secondary | ICD-10-CM

## 2022-02-05 MED ORDER — CYCLOBENZAPRINE HCL 5 MG PO TABS: 5.0000 mg | ORAL_TABLET | Freq: Three times a day (TID) | ORAL | 1 refills | Status: DC | PRN

## 2022-02-05 MED ORDER — NITROGLYCERIN 0.4 MG SL SUBL: 0.4000 mg | SUBLINGUAL_TABLET | SUBLINGUAL | 6 refills | Status: DC | PRN

## 2022-02-05 NOTE — Patient Instructions (Signed)
Take Flexeril 5 mg as needed for left shoulder muscle spasms Do not drive, operate power machinery while taking Flexeril Do shoulder exercises daily Follow-up as needed  Shoulder Exercises Ask your health care provider which exercises are safe for you. Do exercises exactly as told by your health care provider and adjust them as directed. It is normal to feel mild stretching, pulling, tightness, or discomfort as you do these exercises. Stop right away if you feel sudden pain or your pain gets worse. Do not begin these exercises until told by your health care provider. Stretching exercises External rotation and abduction This exercise is sometimes called corner stretch. This exercise rotates your arm outward (external rotation) and moves your arm out from your body (abduction). Stand in a doorway with one of your feet slightly in front of the other. This is called a staggered stance. If you cannot reach your forearms to the door frame, stand facing a corner of a room. Choose one of the following positions as told by your health care provider: Place your hands and forearms on the door frame above your head. Place your hands and forearms on the door frame at the height of your head. Place your hands on the door frame at the height of your elbows. Slowly move your weight onto your front foot until you feel a stretch across your chest and in the front of your shoulders. Keep your head and chest upright and keep your abdominal muscles tight. Hold for __________ seconds. To release the stretch, shift your weight to your back foot. Repeat __________ times. Complete this exercise __________ times a day. Extension, standing Stand and hold a broomstick, a cane, or a similar object behind your back. Your hands should be a little wider than shoulder width apart. Your palms should face away from your back. Keeping your elbows straight and your shoulder muscles relaxed, move the stick away from your body  until you feel a stretch in your shoulders (extension). Avoid shrugging your shoulders while you move the stick. Keep your shoulder blades tucked down toward the middle of your back. Hold for __________ seconds. Slowly return to the starting position. Repeat __________ times. Complete this exercise __________ times a day. Range-of-motion exercises Pendulum  Stand near a wall or a surface that you can hold onto for balance. Bend at the waist and let your left / right arm hang straight down. Use your other arm to support you. Keep your back straight and do not lock your knees. Relax your left / right arm and shoulder muscles, and move your hips and your trunk so your left / right arm swings freely. Your arm should swing because of the motion of your body, not because you are using your arm or shoulder muscles. Keep moving your hips and trunk so your arm swings in the following directions, as told by your health care provider: Side to side. Forward and backward. In clockwise and counterclockwise circles. Continue each motion for __________ seconds, or for as long as told by your health care provider. Slowly return to the starting position. Repeat __________ times. Complete this exercise __________ times a day. Shoulder flexion, standing  Stand and hold a broomstick, a cane, or a similar object. Place your hands a little more than shoulder width apart on the object. Your left / right hand should be palm up, and your other hand should be palm down. Keep your elbow straight and your shoulder muscles relaxed. Push the stick up with your healthy  arm to raise your left / right arm in front of your body, and then over your head until you feel a stretch in your shoulder (flexion). Avoid shrugging your shoulder while you raise your arm. Keep your shoulder blade tucked down toward the middle of your back. Hold for __________ seconds. Slowly return to the starting position. Repeat __________ times.  Complete this exercise __________ times a day. Shoulder abduction, standing Stand and hold a broomstick, a cane, or a similar object. Place your hands a little more than shoulder width apart on the object. Your left / right hand should be palm up, and your other hand should be palm down. Keep your elbow straight and your shoulder muscles relaxed. Push the object across your body toward your left / right side. Raise your left / right arm to the side of your body (abduction) until you feel a stretch in your shoulder. Do not raise your arm above shoulder height unless your health care provider tells you to do that. If directed, raise your arm over your head. Avoid shrugging your shoulder while you raise your arm. Keep your shoulder blade tucked down toward the middle of your back. Hold for __________ seconds. Slowly return to the starting position. Repeat __________ times. Complete this exercise __________ times a day. Internal rotation  Place your left / right hand behind your back, palm up. Use your other hand to dangle an exercise band, a towel, or a similar object over your shoulder. Grasp the band with your left / right hand so you are holding on to both ends. Gently pull up on the band until you feel a stretch in the front of your left / right shoulder. The movement of your arm toward the center of your body is called internal rotation. Avoid shrugging your shoulder while you raise your arm. Keep your shoulder blade tucked down toward the middle of your back. Hold for __________ seconds. Release the stretch by letting go of the band and lowering your hands. Repeat __________ times. Complete this exercise __________ times a day. Strengthening exercises External rotation  Sit in a stable chair without armrests. Secure an exercise band to a stable object at elbow height on your left / right side. Place a soft object, such as a folded towel or a small pillow, between your left / right upper  arm and your body to move your elbow about 4 inches (10 cm) away from your side. Hold the end of the exercise band so it is tight and there is no slack. Keeping your elbow pressed against the soft object, slowly move your forearm out, away from your abdomen (external rotation). Keep your body steady so only your forearm moves. Hold for __________ seconds. Slowly return to the starting position. Repeat __________ times. Complete this exercise __________ times a day. Shoulder abduction  Sit in a stable chair without armrests, or stand up. Hold a __________ weight in your left / right hand, or hold an exercise band with both hands. Start with your arms straight down and your left / right palm facing in, toward your body. Slowly lift your left / right hand out to your side (abduction). Do not lift your hand above shoulder height unless your health care provider tells you that this is safe. Keep your arms straight. Avoid shrugging your shoulder while you do this movement. Keep your shoulder blade tucked down toward the middle of your back. Hold for __________ seconds. Slowly lower your arm, and return to the  starting position. Repeat __________ times. Complete this exercise __________ times a day. Shoulder extension Sit in a stable chair without armrests, or stand up. Secure an exercise band to a stable object in front of you so it is at shoulder height. Hold one end of the exercise band in each hand. Your palms should face each other. Straighten your elbows and lift your hands up to shoulder height. Step back, away from the secured end of the exercise band, until the band is tight and there is no slack. Squeeze your shoulder blades together as you pull your hands down to the sides of your thighs (extension). Stop when your hands are straight down by your sides. Do not let your hands go behind your body. Hold for __________ seconds. Slowly return to the starting position. Repeat __________  times. Complete this exercise __________ times a day. Shoulder row Sit in a stable chair without armrests, or stand up. Secure an exercise band to a stable object in front of you so it is at waist height. Hold one end of the exercise band in each hand. Position your palms so that your thumbs are facing the ceiling (neutral position). Bend each of your elbows to a 90-degree angle (right angle) and keep your upper arms at your sides. Step back until the band is tight and there is no slack. Slowly pull your elbows back behind you. Hold for __________ seconds. Slowly return to the starting position. Repeat __________ times. Complete this exercise __________ times a day. Shoulder press-ups  Sit in a stable chair that has armrests. Sit upright, with your feet flat on the floor. Put your hands on the armrests so your elbows are bent and your fingers are pointing forward. Your hands should be about even with the sides of your body. Push down on the armrests and use your arms to lift yourself off the chair. Straighten your elbows and lift yourself up as much as you comfortably can. Move your shoulder blades down, and avoid letting your shoulders move up toward your ears. Keep your feet on the ground. As you get stronger, your feet should support less of your body weight as you lift yourself up. Hold for __________ seconds. Slowly lower yourself back into the chair. Repeat __________ times. Complete this exercise __________ times a day. Wall push-ups  Stand so you are facing a stable wall. Your feet should be about one arm-length away from the wall. Lean forward and place your palms on the wall at shoulder height. Keep your feet flat on the floor as you bend your elbows and lean forward toward the wall. Hold for __________ seconds. Straighten your elbows to push yourself back to the starting position. Repeat __________ times. Complete this exercise __________ times a day. This information is not  intended to replace advice given to you by your health care provider. Make sure you discuss any questions you have with your health care provider. Document Revised: 08/06/2021 Document Reviewed: 09/10/2018 Elsevier Patient Education  [place].   Muscle Cramps and Spasms Muscle cramps and spasms are when muscles tighten by themselves. They usually get better within minutes. Muscle cramps are painful. They are usually stronger and last longer than muscle spasms. Muscle spasms may or may not be painful. They can last a few seconds or much longer. Cramps and spasms can affect any muscle, but they occur most often in the calf muscles of the leg. They are usually not caused by a serious problem. In many cases,  the cause is not known. Some common causes include: Doing more physical work or exercise than your body is ready for. Using the muscles too much (overuse) by repeating certain movements too many times. Staying in a certain position for a long time. Playing a sport or doing an activity without preparing properly. Using bad form or technique while playing a sport or doing an activity. Not having enough water in your body (dehydration). Injury. Side effects of some medicines. Low levels of the salts and minerals in your blood (electrolytes), such as low potassium or calcium. Follow these instructions at home: Managing pain and stiffness     Massage, stretch, and relax the muscle. Do this for many minutes at a time. If told, put heat on tight or tense muscles as often as told by your doctor. Use the heat source that your doctor recommends, such as a moist heat pack or a heating pad. Place a towel between your skin and the heat source. Leave the heat on for 20-30 minutes. Remove the heat if your skin turns bright red. This is very important if you are not able to feel pain, heat, or cold. You may have a greater risk of getting burned. If told, put ice on the affected area. This may  help if you are sore or have pain after a cramp or spasm. Put ice in a plastic bag. Place a towel between your skin and the bag. Leave the ice on for 20 minutes, 2-3 times a day. Try taking hot showers or baths to help relax tight muscles. Eating and drinking Drink enough fluid to keep your pee (urine) pale yellow. Eat a healthy diet to help ensure that your muscles work well. This should include: Fruits and vegetables. Lean protein. Whole grains. Low-fat or nonfat dairy products. General instructions If you are having cramps often, avoid intense exercise for several days. Take over-the-counter and prescription medicines only as told by your doctor. Watch for any changes in your symptoms. Keep all follow-up visits as told by your doctor. This is important. Contact a doctor if: Your cramps or spasms get worse or happen more often. Your cramps or spasms do not get better with time. Summary Muscle cramps and spasms are when muscles tighten by themselves. They usually get better within minutes. Cramps and spasms occur most often in the calf muscles of the leg. Massage, stretch, and relax the muscle. This may help the cramp or spasm go away. Drink enough fluid to keep your pee (urine) pale yellow. This information is not intended to replace advice given to you by your health care provider. Make sure you discuss any questions you have with your health care provider. Document Revised: 03/01/2021 Document Reviewed: 03/01/2021 Elsevier Patient Education  [place].

## 2022-02-05 NOTE — Progress Notes (Signed)
Acute Office Visit  Subjective:    Patient ID: Kerry Matthews, male    DOB: [birth year], 86 y.o.   MRN: [id number]  CC: Left shoulder muscle spasms  HPI: Patient is in today for left shoulder muscle spasms. Onset was a few days ago Denies pain, swelling, decreased ROM/strength, or numbness/tingling. Describes tightening and twitching left shoulder muscle that radiates up left side of neck. Denies aggravating or alleviating factors. Denies trying any treatments at home.He is active, works in his yard daily, sometimes lifting heavy equipment.   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  ANCA antibodies- myeloperoxidase ABS-greater than 100, ANCA proteinase 3-less than 3.5 04/02/2019- ANCA titers- p-ANCA +1: 160, C ANCA-less than 1: 20, atypical p-ANCA titer-less than 1: 20   Abnormal CT of the chest    Abnormal findings on diagnostic imaging of lung 04/12/2019   04/01/2019-CT chest with contrast- no acute process in chest abdomen or pelvis, interstitial lung disease suspicious for early or mild UIP, pulmonary artery enlargement suggest PAH    Acute pain of left knee 11/30/2019   Alpha-1-antitrypsin deficiency carrier 05/04/2019   Atherosclerotic heart disease of native coronary artery without angina pectoris 05/03/2018   Body mass index (BMI) 29.0-29.9, adult 01/05/2020   Bradycardia    Chronic kidney disease, stage 3a ([place]) 08/12/2021   CKD (chronic kidney disease) 05/03/2018   Coronary artery disease    Coronary artery disease involving native coronary artery of native heart without angina pectoris 05/03/2018   Diabetes mellitus type 2 in obese ([place]) 04/01/2019   Diabetic polyneuropathy associated with type 2 diabetes mellitus ([place]) 08/12/2021   Dyslipidemia associated with type 2 diabetes mellitus ([place]) 05/03/2018   Elevated rheumatoid factor 04/12/2019   04/03/2019-rheumatoid factor-95.4   Essential hypertension 05/03/2018   GAD  (generalized anxiety disorder)    GERD without esophagitis 05/03/2018   Hematuria 09/07/2020   History of kidney stones    Hypertensive heart disease without heart failure 05/03/2018   Hyponatremia 08/12/2021   Idiopathic pulmonary fibrosis ([place]) 03/29/2021   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[place]) 03/29/2021   Insomnia 05/03/2018   Interstitial pulmonary disease ([place]) 04/12/2019   04/01/2019-CT chest with contrast- no acute process in chest abdomen or pelvis, interstitial lung disease suspicious for early or mild UIP, pulmonary artery enlargement suggest PAH  04/02/2019-connective tissue work-up: Anti-Jo 1-negative Anti-DNA antibody double-stranded-negative Anti-scleroderma antibody-negative Sjogren's syndrome antibody-negative Sjogren's syndrome antibody-negative CK-31 CCP-6 E   Leukocytosis 03/08/2020   Low vitamin B12 level 04/03/2019   Lower extremity edema    Medicare annual wellness visit, subsequent 08/09/2020   Microscopic polyangiitis ([place]) 03/29/2021   Mixed diabetic hyperlipidemia associated with type 2 diabetes mellitus ([place]) 08/12/2021   Mixed hyperlipidemia 05/03/2018   Myalgia 04/19/2019   Osteoarthritis    Other emphysema ([place])    Other long term (current) drug therapy 03/29/2021   Peripheral polyneuropathy 01/30/2020   Polymyositis ([place])    Primary insomnia    Proteinuria 09/07/2020   Renal insufficiency 09/06/2020   Respiratory disorder concurrent with and due to microscopic polyangiitis ([place]) 08/12/2021   Rheumatoid factor positive 03/29/2021   Sepsis ([place]) 04/19/2019   Skin cancer    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/17/2020   Transaminitis    Trigger finger 05/03/2018   Type 2 diabetes mellitus with hyperglycemia, with long-term current use of insulin ([place]) 04/23/2021   UC (ulcerative colitis) ([place])    UTI (urinary tract infection)    Vasculitis ([place]) 03/29/2021  Past Surgical History:  Procedure Laterality Date   ANGIOPLASTY  2010   BACK SURGERY      CATARACT EXTRACTION     COLONOSCOPY  06/16/2005   Mild colitis involving splenic flexure. Colonic polyps, status post polypectomy. Mild pancolonic diverticulitits. Internal hemorrhoids.    ESOPHAGOGASTRODUODENOSCOPY  04/26/2003   Irregular Z line suggestive of GERD. Mild gastritis status post CLO testing.    TOTAL KNEE ARTHROPLASTY Left 12/17/2020   Procedure: LEFT TOTAL KNEE ARTHROPLASTY;  Surgeon: Leandrew Koyanagi, MD;  Location: [facility];  Service: Orthopedics;  Laterality: Left;   TRIGGER FINGER RELEASE      Family History  Problem Relation Age of Onset   Tuberculosis Mother    Stroke Father    Pancreatic cancer Sister    Heart attack Sister    Lung disease Sister    Clotting disorder Brother    Colon cancer Neg Hx    Esophageal cancer Neg Hx     Social History   Socioeconomic History   Marital status: Married    Spouse name: Not on file   Number of children: 2   Years of education: Not on file   Highest education level: Not on file  Occupational History   Occupation: retired  Tobacco Use   Smoking status: Never   Smokeless tobacco: Never  Vaping Use   Vaping Use: Never used  Substance and Sexual Activity   Alcohol use: Never   Drug use: Never   Sexual activity: Not on file  Other Topics Concern   Not on file  Social History Narrative   Not on file   Social Determinants of Health   Financial Resource Strain: Low Risk  (10/28/2021)   Overall Financial Resource Strain (CARDIA)    Difficulty of Paying Living Expenses: Not hard at all  Food Insecurity: No Food Insecurity (06/07/2020)   Hunger Vital Sign    Worried About Running Out of Food in the Last Year: Never true    Ran Out of Food in the Last Year: Never true  Transportation Needs: No Transportation Needs (10/28/2021)   PRAPARE - Hydrologist (Medical): No    Lack of Transportation (Non-Medical): No  Physical Activity: Sufficiently Active (06/07/2020)   Exercise Vital Sign    Days  of Exercise per Week: 5 days    Minutes of Exercise per Session: 30 min  Stress: Not on file  Social Connections: Not on file  Intimate Partner Violence: Not on file    Outpatient Medications Prior to Visit  Medication Sig Dispense Refill   Alcohol Swabs (B-D SINGLE USE SWABS REGULAR) PADS 1 each by Does not apply route in the morning, at noon, in the evening, and at bedtime. 300 each 3   aspirin EC 81 MG tablet Take 81 mg by mouth daily. Swallow whole.     Coenzyme Q10 100 MG TABS Take 100 mg by mouth at bedtime.     furosemide (LASIX) 20 MG tablet Take 1 tablet (20 mg total) by mouth daily as needed. 90 tablet 1   gabapentin (NEURONTIN) 400 MG capsule Take 400 mg by mouth 2 (two) times daily.     glucose blood (TRUE METRIX BLOOD GLUCOSE TEST) test strip TEST BLOOD SUGAR THREE TIMES DAILY BEFORE MEALS 300 strip 3   insulin aspart (NOVOLOG FLEXPEN) 100 UNIT/ML FlexPen Up to 16 U per day Check sugar prior to each meal and give dose below. 100 - 150: 4 U. 151 - 200: +  2 U (6 U) 201 - 250: + 4 U (8 U) 251 - 300: + 6 U (10 U) 301 - 350: + 8 U (12 U) 350 - 400: + 10 U (14 U) > 400: + 12 U (16U) 15 mL 11   Insulin Pen Needle (DROPLET PEN NEEDLES) 31G X 8 MM MISC Gives max of 5 insulin injections daily. 500 each 3   LANTUS SOLOSTAR 100 UNIT/ML Solostar Pen Inject 20 Units into the skin 2 (two) times daily. 15 mL 1   Multiple Vitamins-Minerals (PRESERVISION AREDS 2) CAPS Take 1 capsule by mouth 2 (two) times daily after a meal.     nitroGLYCERIN (NITROSTAT) 0.4 MG SL tablet Place 1 tablet (0.4 mg total) under the tongue every 5 (five) minutes as needed for chest pain. 25 tablet 6   omeprazole (PRILOSEC) 20 MG capsule Take 1 capsule (20 mg total) by mouth daily. 90 capsule 1   OXYGEN Inhale 1 L into the lungs daily. Using as needed during the day and continuous at night.     predniSONE (DELTASONE) 10 MG tablet Take 10 mg by mouth daily with breakfast.     rosuvastatin (CRESTOR) 20 MG tablet Take 1  tablet (20 mg total) by mouth daily. 90 tablet 3   vitamin B-12 (CYANOCOBALAMIN) 1000 MCG tablet Take 1 tablet (1,000 mcg total) by mouth daily. 30 tablet 0   No facility-administered medications prior to visit.    Allergies  Allergen Reactions   Ace Inhibitors Other (See Comments)    = Slow heart rate   Hydrocodone Nausea And Vomiting   Hydrocodone-Acetaminophen Nausea And Vomiting   Nsaids Other (See Comments)    Kidney issues   Sulfamethoxazole Nausea And Vomiting   Sulfamethoxazole-Trimethoprim Nausea And Vomiting   Trimethoprim Other (See Comments)    Reaction not recalled    Review of Systems    See pertinent positives and negatives per HPI.  Objective:    Physical Exam Vitals reviewed.  Cardiovascular:     Rate and Rhythm: Normal rate and regular rhythm.     Pulses: Normal pulses.     Heart sounds: Normal heart sounds.  Pulmonary:     Effort: Pulmonary effort is normal.     Breath sounds: Normal breath sounds.  Musculoskeletal:        General: No tenderness. Normal range of motion.     Right shoulder: No swelling, deformity, tenderness or bony tenderness. Normal range of motion. Normal strength. Normal pulse.     Left shoulder: Normal. No swelling, deformity, tenderness or bony tenderness. Normal range of motion. Normal strength. Normal pulse.     Right hand: Normal. Normal range of motion. Normal strength. Normal sensation.     Left hand: Normal. Normal range of motion. Normal strength. Normal sensation.  Skin:    General: Skin is warm and dry.     Capillary Refill: Capillary refill takes less than 2 seconds.  Neurological:     General: No focal deficit present.     Mental Status: He is alert and oriented to person, place, and time.     BP 118/62   Pulse (!) 57   Ht '5\' 6"'  (1.676 m)   Wt 185 lb (83.9 kg)   SpO2 95%   BMI 29.86 kg/m   Wt Readings from Last 3 Encounters:  12/26/21 189 lb 12.8 oz (86.1 kg)  12/26/21 189 lb (85.7 kg)  12/06/21 189 lb 3.2  oz (85.8 kg)    Health Maintenance Due  Topic Date Due   TETANUS/TDAP  Never done   Zoster Vaccines- Shingrix (1 of 2) Never done   COVID-19 Vaccine (4 - Booster for Pfizer series) 07/31/2020       Lab Results  Component Value Date   TSH 2.830 04/04/2021   Lab Results  Component Value Date   WBC 15.1 (H) 12/26/2021   HGB 15.5 12/26/2021   HCT 45.7 12/26/2021   MCV 87 12/26/2021   PLT 263 12/26/2021   Lab Results  Component Value Date   NA 141 12/26/2021   K 4.0 12/26/2021   CO2 25 12/26/2021   GLUCOSE 72 12/26/2021   BUN 20 12/26/2021   CREATININE 1.19 12/26/2021   BILITOT 0.9 12/26/2021   ALKPHOS 102 12/26/2021   AST 24 12/26/2021   ALT 22 12/26/2021   PROT 6.5 12/26/2021   ALBUMIN 4.1 12/26/2021   CALCIUM 9.1 12/26/2021   ANIONGAP 5 08/23/2021   EGFR 60 12/26/2021   GFR 53.09 (L) 09/20/2021   Lab Results  Component Value Date   CHOL 162 12/26/2021   Lab Results  Component Value Date   HDL 59 12/26/2021   Lab Results  Component Value Date   LDLCALC 87 12/26/2021   Lab Results  Component Value Date   TRIG 85 12/26/2021   Lab Results  Component Value Date   CHOLHDL 2.7 12/26/2021   Lab Results  Component Value Date   HGBA1C 7.4 (H) 12/26/2021       Assessment & Plan:   1. Muscle spasm of left shoulder - cyclobenzaprine (FLEXERIL) 5 MG tablet; Take 1 tablet (5 mg total) by mouth 3 (three) times daily as needed for muscle spasms.  Dispense: 30 tablet; Refill: 1       Take Flexeril 5 mg as needed for left shoulder muscle spasms Do not drive, operate power machinery while taking Flexeril Do shoulder exercises daily Follow-up as needed  Follow-up: PRN  An After Visit Summary was printed and given to the patient.  I,Victoria C Greene,acting as a Education administrator for [employer], NP.,have documented all relevant documentation on the behalf of Rip Harbour, NP,as directed by  Rip Harbour, NP while in the presence of Rip Harbour,  NP.  I, Rip Harbour, NP, have reviewed all documentation for this visit. The documentation on 02/05/22 for the exam, diagnosis, procedures, and orders are all accurate and complete.   Signed, Rip Harbour, NP [place] [phone]

## 2022-02-06 NOTE — Telephone Encounter (Signed)
Patient's sugars dropping below 70 weekly. Will let PCP know

## 2022-02-10 NOTE — Telephone Encounter (Signed)
Told patient to decrease the short acting, will verify with PCP that was the intention

## 2022-02-18 ENCOUNTER — Ambulatory Visit: Payer: Medicare HMO | Admitting: Pulmonary Disease

## 2022-02-19 ENCOUNTER — Ambulatory Visit: Payer: Medicare HMO | Admitting: Pulmonary Disease

## 2022-02-19 ENCOUNTER — Encounter: Payer: Self-pay | Admitting: Pulmonary Disease

## 2022-02-19 VITALS — BP 136/78 | HR 91 | Temp 97.6°F | Ht 65.0 in | Wt 182.4 lb

## 2022-02-19 DIAGNOSIS — Z5181 Encounter for therapeutic drug level monitoring: Secondary | ICD-10-CM | POA: Diagnosis not present

## 2022-02-19 DIAGNOSIS — E663 Overweight: Secondary | ICD-10-CM | POA: Diagnosis not present

## 2022-02-19 DIAGNOSIS — J849 Interstitial pulmonary disease, unspecified: Secondary | ICD-10-CM

## 2022-02-19 DIAGNOSIS — Z6827 Body mass index (BMI) 27.0-27.9, adult: Secondary | ICD-10-CM | POA: Diagnosis not present

## 2022-02-19 DIAGNOSIS — M317 Microscopic polyangiitis: Secondary | ICD-10-CM | POA: Diagnosis not present

## 2022-02-19 DIAGNOSIS — J84112 Idiopathic pulmonary fibrosis: Secondary | ICD-10-CM | POA: Diagnosis not present

## 2022-02-19 DIAGNOSIS — R768 Other specified abnormal immunological findings in serum: Secondary | ICD-10-CM | POA: Diagnosis not present

## 2022-02-19 DIAGNOSIS — Z79899 Other long term (current) drug therapy: Secondary | ICD-10-CM | POA: Diagnosis not present

## 2022-02-19 LAB — CBC WITH DIFFERENTIAL/PLATELET
Basophils Absolute: 0.1 10*3/uL (ref 0.0–0.1)
Basophils Relative: 0.6 % (ref 0.0–3.0)
Eosinophils Absolute: 0.1 10*3/uL (ref 0.0–0.7)
Eosinophils Relative: 1 % (ref 0.0–5.0)
HCT: 46.6 % (ref 39.0–52.0)
Hemoglobin: 15.3 g/dL (ref 13.0–17.0)
Lymphocytes Relative: 15.1 % (ref 12.0–46.0)
Lymphs Abs: 1.9 10*3/uL (ref 0.7–4.0)
MCHC: 32.8 g/dL (ref 30.0–36.0)
MCV: 86.9 fl (ref 78.0–100.0)
Monocytes Absolute: 0.4 10*3/uL (ref 0.1–1.0)
Monocytes Relative: 3.6 % (ref 3.0–12.0)
Neutro Abs: 9.9 10*3/uL — ABNORMAL HIGH (ref 1.4–7.7)
Neutrophils Relative %: 79.7 % — ABNORMAL HIGH (ref 43.0–77.0)
Platelets: 265 10*3/uL (ref 150.0–400.0)
RBC: 5.36 Mil/uL (ref 4.22–5.81)
RDW: 14.8 % (ref 11.5–15.5)
WBC: 12.4 10*3/uL — ABNORMAL HIGH (ref 4.0–10.5)

## 2022-02-19 LAB — COMPREHENSIVE METABOLIC PANEL
ALT: 17 U/L (ref 0–53)
AST: 20 U/L (ref 0–37)
Albumin: 3.9 g/dL (ref 3.5–5.2)
Alkaline Phosphatase: 106 U/L (ref 39–117)
BUN: 27 mg/dL — ABNORMAL HIGH (ref 6–23)
CO2: 24 mEq/L (ref 19–32)
Calcium: 9.2 mg/dL (ref 8.4–10.5)
Chloride: 100 mEq/L (ref 96–112)
Creatinine, Ser: 1.57 mg/dL — ABNORMAL HIGH (ref 0.40–1.50)
GFR: 40.27 mL/min — ABNORMAL LOW (ref >60.00)
Glucose, Bld: 322 mg/dL — ABNORMAL HIGH (ref 70–99)
Potassium: 4.8 mEq/L (ref 3.5–5.1)
Sodium: 134 mEq/L — ABNORMAL LOW (ref 135–145)
Total Bilirubin: 0.7 mg/dL (ref 0.2–1.2)
Total Protein: 6.9 g/dL (ref 6.0–8.3)

## 2022-02-19 NOTE — Patient Instructions (Signed)
We will check some labs including CMP, CBC today Check IgG subclass, ANCA screen, ANA, CCP, rheumatoid We will start you on a medication called Ofev Start tapering prednisone to 5 mg a day for 2 weeks and then stop altogether Check overnight oximetry on room air Follow-up in 3 months.

## 2022-02-19 NOTE — Progress Notes (Addendum)
Kerry Matthews    [id number]    March 30, 1936  Primary Care Physician:Cox, Elnita Maxwell, MD  Referring Physician: [name], MD [address] [place],  [place] [address]  Chief complaint: Follow up for CTD interstitial lung disease, alpha-1 antitrypsin carrier  HPI: 86 year old with pulmonary fibrosis on CT chest. He has complains of joint stiffness, generalized myalgias, low-grade fevers starting July 2020.  The symptoms generally respond to prednisone.  Denies any renal symptoms, difficulty swallowing.  Ruled out for COVID- 19 multiple times.  He has seen Dr. Trudie Reed, Rheumatology. Serologies reviewed which show elevated p-ANCA, myeloperoxidase with elevated CRP, normal sed rate, elevated IgG4 subclass, repeat rheumatoid factor is negative. 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  Was initially on prednisone and eventually got started on Rituxan by Dr. Trudie Reed from rheumatology in late 2020 with tapering off of prednisone.  Symptoms have been stable while on Rituxan  He developed COVID-19 infection on 07/29/2021 which was treated as an outpatient with Molnupiravir.  However his situation deteriorated and was admitted to [facility] on 08/12/2021 to 08/25/2021. He was diagnosed with multifocal pneumonia, Acute Respiratory failure up to 15 L.  Treated with convalescent plasma, IV steroids which was transitioned to prednisone 60 mg.  Discharged on supplemental oxygen Started on insulin due to high blood sugars by primary care.  Unable to use Bactrim prophylaxis due to allergies  He was treated with antibiotics at last visit in January 2022 for elevated WBC count and chest x-ray with persistent bilateral opacities  Pets: Has a dog.  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 No mold, hot tub, Jacuzzi, down, humidifier Smoking history: Never smoker Travel history: No significant travel history Relevant family history: 2 sisters died of pulmonary fibrosis of unclear etiology.  1 of the sisters also had COPD  Interim history:  Steroids are slowly weaning down.  He is now on 10 mg a day States that dyspnea is unchanged Stop using supplemental oxygen during the daytime.  Continues on 1 L at night  He has a follow-up CT that shows worsening pulmonary fibrosis  Outpatient Encounter Medications as of 02/19/2022  Medication Sig   Alcohol Swabs (B-D SINGLE USE SWABS REGULAR) PADS 1 each by Does not apply route in the morning, at noon, in the evening, and at bedtime.   aspirin EC 81 MG tablet Take 81 mg by mouth daily. Swallow whole.   Coenzyme Q10 100 MG TABS Take 100 mg by mouth at bedtime.   furosemide (LASIX) 20 MG tablet Take 1 tablet (20 mg total) by mouth daily as needed.   gabapentin (NEURONTIN) 400 MG capsule Take 400 mg by mouth 2 (two) times daily.   glucose blood (TRUE METRIX BLOOD GLUCOSE TEST) test strip TEST BLOOD SUGAR THREE TIMES DAILY BEFORE MEALS   insulin aspart (NOVOLOG FLEXPEN) 100 UNIT/ML FlexPen Up to 16 U per day Check sugar prior to each meal and give dose below. 100 - 150: 4 U. 151 - 200: + 2 U (6 U) 201 - 250: + 4 U (8 U) 251 - 300: + 6 U (10 U) 301 - 350: + 8 U (12 U) 350 - 400: + 10 U (14 U) >  400: + 12 U (16U)   Insulin Glargine (TOUJEO MAX SOLOSTAR [place]) Inject 16 Units into the skin 2 (two) times daily.   Insulin Pen Needle (DROPLET PEN NEEDLES) 31G X 8 MM MISC Gives max of 5 insulin injections daily.   Multiple Vitamins-Minerals (PRESERVISION AREDS 2) CAPS Take 1 capsule by mouth 2 (two) times daily after a meal.   nitroGLYCERIN (NITROSTAT) 0.4 MG SL tablet Place 1 tablet (0.4 mg total) under the tongue every 5 (five) minutes as needed  for chest pain.   omeprazole (PRILOSEC) 20 MG capsule Take 1 capsule (20 mg total) by mouth daily.   OXYGEN Inhale 1 L into the lungs daily. Using as needed during the day and continuous at night.   predniSONE (DELTASONE) 10 MG tablet Take 10 mg by mouth daily with breakfast.   rosuvastatin (CRESTOR) 20 MG tablet Take 1 tablet (20 mg total) by mouth daily.   vitamin B-12 (CYANOCOBALAMIN) 1000 MCG tablet Take 1 tablet (1,000 mcg total) by mouth daily.   LANTUS SOLOSTAR 100 UNIT/ML Solostar Pen Inject 20 Units into the skin 2 (two) times daily.   [DISCONTINUED] cyclobenzaprine (FLEXERIL) 5 MG tablet Take 1 tablet (5 mg total) by mouth 3 (three) times daily as needed for muscle spasms.   No facility-administered encounter medications on file as of 02/19/2022.   Physical Exam: Blood pressure 136/78, pulse 91, temperature 97.6 F (36.4 C), temperature source Oral, height '5\' 5"'$  (1.651 m), weight 182 lb 6.4 oz (82.7 kg), SpO2 92 %. Gen:      No acute distress HEENT:  EOMI, sclera anicteric Neck:     No masses; no thyromegaly Lungs:    Clear to auscultation bilaterally; normal respiratory effort CV:         Regular rate and rhythm; no murmurs Abd:      + bowel sounds; soft, non-tender; no palpable masses, no distension Ext:    No edema; adequate peripheral perfusion Skin:      Warm and dry; no rash Neuro: alert and oriented x 3 Psych: normal mood and affect   Data Reviewed: Imaging: CT high-resolution 04/19/2019- patchy groundglass attenuation, septal thickening with reticulation and mild bronchiectasis with basal gradient.  No definitive honeycombing Probable UIP.   CT high-resolution 10/14/2019-stable findings of interstitial lung disease, pulmonary fibrosis and probable UIP pattern  CTA 08/12/2021-no pulmonary embolism, patchy multifocal airspace disease  CTA 08/18/2021-no PE, extensive bilateral homogeneous groundglass opacities with septal thickening, fibrotic changes at the  bases.  High-resolution CT 01/28/2022-pulmonary pattern of fibrosis with progressive UIP changes. I have reviewed the images personally  PFTs: 05/03/2019 FVC 3.22 [92%], FEV1 2.57 [104%], F/F 80, TLC 4.85 [95%], DLCO 18.93 [85%] Minimal restriction  Labs: 04/02/2019- p-ANCA 1:160, c-ANCA negative, MPO>100, rheumatoid factor 95 CCP negative, Sjogren's antibody negative, SCL 70- negative Jo 1-negative, aldolase 6.3, CK 31  Alpha-1 antitrypsin 04/12/2019- 124, PI MZ  Assessment:  Recent hospitalization for COVID-19, multifocal bacterial pneumonia He is making slow recovery posthospitalization.  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  He is currently on prednisone at 50 mg.  He will need a slow taper over the next couple of months.  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 His follow-up CT shows post-COVID changes and also a progressive UIP phenotype.  Start Ofev due to progressive fibrotic phenotype.  Discuss with Dr. Trudie Reed if he needs to go back on Rituxan  Alpha-1 antitrypsin carrier status His A1AT levels are normal with no obstruction on pulmonary function test.  LFTs are normal Continue monitoring.  No need for treatment.  Plan/Recommendations: Slow prednisone taper as above Lasix 20 mg a day Start Ofev Check baseline labs Discussed with rheumatology  [name] MD [facility] 02/19/2022, 1:39 PM  CC: [name], MD  Interim history: Received note from Dr. Gavin Pound 02/19/2022 Follow-up for microscopic polyangiitis, elevated rheumatoid factor.  There is no mention of hyper IgE syndrome on  rheumatology assessment  Per rheumatology assessment of microscopic polyangiitis is not contributing to pulmonary symptoms and at this point he does not repeat rituximab from rheumatology standpoint.  [name] MD [facility] 03/13/2022, 4:37 PM

## 2022-02-21 ENCOUNTER — Telehealth: Payer: Self-pay

## 2022-02-21 NOTE — Telephone Encounter (Signed)
Received New start paperwork for OFEV. Will update as we work through the benefits process.  Submitted a Prior Authorization request to [facility] for OFEV via CoverMyMeds. Will update once we receive a response.   Key: [id number]

## 2022-02-27 LAB — IGE: IgE (Immunoglobulin E), Serum: 48 kU/L (ref <=114)

## 2022-02-27 LAB — RHEUMATOID FACTOR: Rheumatoid fact SerPl-aCnc: <14 IU/mL (ref <14)

## 2022-02-27 LAB — ANA: Anti Nuclear Antibody (ANA): NEGATIVE

## 2022-02-27 LAB — ANCA SCREEN W REFLEX TITER: ANCA SCREEN: NEGATIVE

## 2022-02-27 LAB — CYCLIC CITRUL PEPTIDE ANTIBODY, IGG: Cyclic Citrullin Peptide Ab: <16 UNITS

## 2022-03-05 ENCOUNTER — Telehealth: Payer: Self-pay

## 2022-03-05 DIAGNOSIS — N3001 Acute cystitis with hematuria: Secondary | ICD-10-CM | POA: Diagnosis not present

## 2022-03-05 NOTE — Telephone Encounter (Signed)
Wife called as she feels patient has UTI. She requests medication be sent w/o appointment as [place] the beach. Call was sent to triage for further information and advisement.   Royce Macadamia, [place] 03/05/22 11:02 AM

## 2022-03-05 NOTE — Telephone Encounter (Signed)
Kerry Matthews patients daughter called stating patient has been confused, c/o dysuria, urinary frequency and urgency x 2 days. [place] the beach and requested a antibiotic be sent in. Informed her based on symptoms he needed to be seen in office or at a Urgent Care. I did consult with Dr. Tobie Poet who agreed especially base on patients medical history. Informed Sandy that per Dr. Tobie Poet, patient needed to be seen by Urgent Care today. Sandy expressed and verbalized understanding. I also provided her with our fax number so they can fax their office notes and results.

## 2022-03-06 DIAGNOSIS — S6992XA Unspecified injury of left wrist, hand and finger(s), initial encounter: Secondary | ICD-10-CM | POA: Diagnosis not present

## 2022-03-06 DIAGNOSIS — J9611 Chronic respiratory failure with hypoxia: Secondary | ICD-10-CM | POA: Insufficient documentation

## 2022-03-06 DIAGNOSIS — I129 Hypertensive chronic kidney disease with stage 1 through stage 4 chronic kidney disease, or unspecified chronic kidney disease: Secondary | ICD-10-CM | POA: Diagnosis not present

## 2022-03-06 DIAGNOSIS — E871 Hypo-osmolality and hyponatremia: Secondary | ICD-10-CM | POA: Diagnosis not present

## 2022-03-06 DIAGNOSIS — M1711 Unilateral primary osteoarthritis, right knee: Secondary | ICD-10-CM | POA: Diagnosis not present

## 2022-03-06 DIAGNOSIS — G9349 Other encephalopathy: Secondary | ICD-10-CM | POA: Diagnosis not present

## 2022-03-06 DIAGNOSIS — E1122 Type 2 diabetes mellitus with diabetic chronic kidney disease: Secondary | ICD-10-CM | POA: Diagnosis not present

## 2022-03-06 DIAGNOSIS — N3000 Acute cystitis without hematuria: Secondary | ICD-10-CM | POA: Diagnosis not present

## 2022-03-06 DIAGNOSIS — S62115A Nondisplaced fracture of triquetrum [cuneiform] bone, left wrist, initial encounter for closed fracture: Secondary | ICD-10-CM | POA: Diagnosis not present

## 2022-03-06 DIAGNOSIS — M25461 Effusion, right knee: Secondary | ICD-10-CM | POA: Diagnosis not present

## 2022-03-06 DIAGNOSIS — R079 Chest pain, unspecified: Secondary | ICD-10-CM | POA: Diagnosis not present

## 2022-03-06 DIAGNOSIS — N390 Urinary tract infection, site not specified: Secondary | ICD-10-CM | POA: Diagnosis not present

## 2022-03-06 DIAGNOSIS — N1831 Chronic kidney disease, stage 3a: Secondary | ICD-10-CM | POA: Diagnosis not present

## 2022-03-06 DIAGNOSIS — B9689 Other specified bacterial agents as the cause of diseases classified elsewhere: Secondary | ICD-10-CM | POA: Diagnosis not present

## 2022-03-06 DIAGNOSIS — J189 Pneumonia, unspecified organism: Secondary | ICD-10-CM | POA: Diagnosis not present

## 2022-03-06 DIAGNOSIS — E6609 Other obesity due to excess calories: Secondary | ICD-10-CM | POA: Diagnosis not present

## 2022-03-06 HISTORY — DX: Chronic respiratory failure with hypoxia: J96.11

## 2022-03-06 NOTE — Telephone Encounter (Signed)
Prior authorization clinical questions did not populate on CMM. Completed authorization over the phone. Turnaround time is 24-72 hours.  Phone: [phone] Ref # [id number]  Knox Saliva, PharmD, MPH, BCPS, CPP Clinical Pharmacist (Rheumatology and Pulmonology)

## 2022-03-07 ENCOUNTER — Other Ambulatory Visit (HOSPITAL_COMMUNITY): Payer: Self-pay

## 2022-03-07 NOTE — Telephone Encounter (Signed)
Received fax from [facility] stating authorization is not required for Ofev.  Per test claim, copay for 30 days supply is [phone]  Spoke with patient's wife - patient is currently in hospital for UTI and a "touch of pneumonia." Reviewed income document requirement for [place] application. She states they will either drop off at  clinic themselves or send with Kerry Arnt, LPN in our clinic (who is their neighbor). Pharmacy team will f/u.  Application placed in PAP pending info folder in pharmacy office while we await income docs  Knox Saliva, PharmD, MPH, BCPS, CPP Clinical Pharmacist (Rheumatology and Pulmonology)

## 2022-03-10 ENCOUNTER — Ambulatory Visit: Payer: Medicare HMO | Admitting: Podiatry

## 2022-03-12 DIAGNOSIS — G4730 Sleep apnea, unspecified: Secondary | ICD-10-CM | POA: Diagnosis not present

## 2022-03-12 DIAGNOSIS — R0683 Snoring: Secondary | ICD-10-CM | POA: Diagnosis not present

## 2022-03-13 ENCOUNTER — Other Ambulatory Visit: Payer: Self-pay

## 2022-03-13 DIAGNOSIS — I2510 Atherosclerotic heart disease of native coronary artery without angina pectoris: Secondary | ICD-10-CM

## 2022-03-13 MED ORDER — NITROGLYCERIN 0.4 MG SL SUBL: 0.4000 mg | SUBLINGUAL_TABLET | SUBLINGUAL | 9 refills | Status: DC | PRN

## 2022-03-14 ENCOUNTER — Encounter: Payer: Self-pay | Admitting: Family Medicine

## 2022-03-14 ENCOUNTER — Ambulatory Visit (INDEPENDENT_AMBULATORY_CARE_PROVIDER_SITE_OTHER): Payer: Medicare HMO | Admitting: Family Medicine

## 2022-03-14 ENCOUNTER — Telehealth: Payer: Self-pay | Admitting: Pulmonary Disease

## 2022-03-14 VITALS — BP 110/58 | HR 52 | Temp 97.4°F | Ht 65.0 in | Wt 182.0 lb

## 2022-03-14 DIAGNOSIS — I119 Hypertensive heart disease without heart failure: Secondary | ICD-10-CM | POA: Diagnosis not present

## 2022-03-14 DIAGNOSIS — N1831 Chronic kidney disease, stage 3a: Secondary | ICD-10-CM | POA: Diagnosis not present

## 2022-03-14 DIAGNOSIS — M25532 Pain in left wrist: Secondary | ICD-10-CM | POA: Diagnosis not present

## 2022-03-14 DIAGNOSIS — J9611 Chronic respiratory failure with hypoxia: Secondary | ICD-10-CM

## 2022-03-14 DIAGNOSIS — N390 Urinary tract infection, site not specified: Secondary | ICD-10-CM

## 2022-03-14 DIAGNOSIS — M25561 Pain in right knee: Secondary | ICD-10-CM | POA: Diagnosis not present

## 2022-03-14 DIAGNOSIS — J84112 Idiopathic pulmonary fibrosis: Secondary | ICD-10-CM | POA: Diagnosis not present

## 2022-03-14 DIAGNOSIS — N3000 Acute cystitis without hematuria: Secondary | ICD-10-CM

## 2022-03-14 LAB — POCT URINALYSIS DIPSTICK
Bilirubin, UA: NEGATIVE
Blood, UA: NEGATIVE
Glucose, UA: NEGATIVE
Ketones, UA: NEGATIVE
Leukocytes, UA: NEGATIVE
Nitrite, UA: NEGATIVE
Protein, UA: NEGATIVE
Spec Grav, UA: 1.015 (ref 1.010–1.025)
Urobilinogen, UA: NEGATIVE E.U./dL — AB
pH, UA: 6 (ref 5.0–8.0)

## 2022-03-14 NOTE — Progress Notes (Signed)
Subjective:  Patient ID: Kerry Matthews, male    DOB: November 29, 1935  Age: 86 y.o. MRN: [id number]  Chief Complaint  Patient presents with   Hospitalization Follow-up   HPI Hospitalized: 7/13-7/18.  Diagnosis Date Noted POA   Pulmonary fibrosis (*) 03/06/2022 Yes: Continue prednisone 10 mg daily.    Chronic respiratory failure with hypoxia (*) 03/06/2022 Yes   Chronic kidney disease, stage 3a (*) 08/12/2021 Yes   Diabetes mellitus type 2: Lantus increased to 14 units in a.m. and 40 units in p.m.   GERD without esophagitis 05/03/2018 Yes   Mixed hyperlipidemia 05/03/2018 Yes   Resolved Hospital Problems  Diagnosis Date Noted Date Resolved POA   *Acute cystitis without hematuria 03/06/2022 03/11/2022 Yes: recommended to complete kelfex.   CAP (community acquired pneumonia) 03/06/2022 03/11/2022 Yes: treated with rocephin then discharged on keflex.  Right knee pain and effusion: xray no fx. Uric acid level is normal. Aspiration during hospitalization.  Also had some left wrist pain when he fell out o a rocking chair.  Discharged with [place].  Also to follow up with orthopedics.   Current Outpatient Medications on File Prior to Visit  Medication Sig Dispense Refill   Alcohol Swabs (B-D SINGLE USE SWABS REGULAR) PADS 1 each by Does not apply route in the morning, at noon, in the evening, and at bedtime. 300 each 3   aspirin EC 81 MG tablet Take 81 mg by mouth daily. Swallow whole.     cephALEXin (KEFLEX) 500 MG capsule Take 500 mg by mouth 4 (four) times daily.     Coenzyme Q10 100 MG TABS Take 100 mg by mouth at bedtime.     furosemide (LASIX) 20 MG tablet Take 1 tablet (20 mg total) by mouth daily as needed. 90 tablet 1   glucose blood (TRUE METRIX BLOOD GLUCOSE TEST) test strip TEST BLOOD SUGAR THREE TIMES DAILY BEFORE MEALS 300 strip 3   insulin aspart (NOVOLOG FLEXPEN) 100 UNIT/ML FlexPen Up to 16 U per day Check sugar prior to each meal and give dose below. 100 - 150: 4 U. 151 - 200: + 2  U (6 U) 201 - 250: + 4 U (8 U) 251 - 300: + 6 U (10 U) 301 - 350: + 8 U (12 U) 350 - 400: + 10 U (14 U) > 400: + 12 U (16U) 15 mL 11   Insulin Glargine (TOUJEO MAX SOLOSTAR [place]) Inject 16 Units into the skin 2 (two) times daily.     Insulin Pen Needle (DROPLET PEN NEEDLES) 31G X 8 MM MISC Gives max of 5 insulin injections daily. 500 each 3   LANTUS 100 UNIT/ML injection Inject into the skin.     Multiple Vitamins-Minerals (PRESERVISION AREDS 2) CAPS Take 1 capsule by mouth 2 (two) times daily after a meal.     nitroGLYCERIN (NITROSTAT) 0.4 MG SL tablet Place 1 tablet (0.4 mg total) under the tongue every 5 (five) minutes as needed for chest pain. 25 tablet 9   omeprazole (PRILOSEC) 20 MG capsule Take 1 capsule (20 mg total) by mouth daily. 90 capsule 1   OXYGEN Inhale 1 L into the lungs daily. Using as needed during the day and continuous at night.     rosuvastatin (CRESTOR) 20 MG tablet Take 1 tablet (20 mg total) by mouth daily. 90 tablet 3   vitamin B-12 (CYANOCOBALAMIN) 1000 MCG tablet Take 1 tablet (1,000 mcg total) by mouth daily. 30 tablet 0   No current facility-administered medications  on file prior to visit.   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  ANCA antibodies- myeloperoxidase ABS-greater than 100, ANCA proteinase 3-less than 3.5 04/02/2019- ANCA titers- p-ANCA +1: 160, C ANCA-less than 1: 20, atypical p-ANCA titer-less than 1: 20   Abnormal CT of the chest    Abnormal findings on diagnostic imaging of lung 04/12/2019   04/01/2019-CT chest with contrast- no acute process in chest abdomen or pelvis, interstitial lung disease suspicious for early or mild UIP, pulmonary artery enlargement suggest PAH    Acute pain of left knee 11/30/2019   Alpha-1-antitrypsin deficiency carrier 05/04/2019   Atherosclerotic heart disease of native coronary artery without angina pectoris 05/03/2018   Body mass index (BMI) 29.0-29.9, adult 01/05/2020    Bradycardia    Chronic kidney disease, stage 3a ([place]) 08/12/2021   CKD (chronic kidney disease) 05/03/2018   Coronary artery disease    Coronary artery disease involving native coronary artery of native heart without angina pectoris 05/03/2018   Diabetes mellitus type 2 in obese ([place]) 04/01/2019   Diabetic polyneuropathy associated with type 2 diabetes mellitus ([place]) 08/12/2021   Dyslipidemia associated with type 2 diabetes mellitus ([place]) 05/03/2018   Elevated rheumatoid factor 04/12/2019   04/03/2019-rheumatoid factor-95.4   Essential hypertension 05/03/2018   GAD (generalized anxiety disorder)    GERD without esophagitis 05/03/2018   Hematuria 09/07/2020   History of kidney stones    Hypertensive heart disease without heart failure 05/03/2018   Hyponatremia 08/12/2021   Idiopathic pulmonary fibrosis ([place]) 03/29/2021   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[place]) 03/29/2021   Insomnia 05/03/2018   Interstitial pulmonary disease ([place]) 04/12/2019   04/01/2019-CT chest with contrast- no acute process in chest abdomen or pelvis, interstitial lung disease suspicious for early or mild UIP, pulmonary artery enlargement suggest PAH  04/02/2019-connective tissue work-up: Anti-Jo 1-negative Anti-DNA antibody double-stranded-negative Anti-scleroderma antibody-negative Sjogren's syndrome antibody-negative Sjogren's syndrome antibody-negative CK-31 CCP-6 E   Leukocytosis 03/08/2020   Low vitamin B12 level 04/03/2019   Lower extremity edema    Medicare annual wellness visit, subsequent 08/09/2020   Microscopic polyangiitis ([place]) 03/29/2021   Mixed diabetic hyperlipidemia associated with type 2 diabetes mellitus ([place]) 08/12/2021   Mixed hyperlipidemia 05/03/2018   Myalgia 04/19/2019   Osteoarthritis    Other emphysema ([place])    Other long term (current) drug therapy 03/29/2021   Peripheral polyneuropathy 01/30/2020   Polymyositis ([place])    Primary insomnia    Proteinuria 09/07/2020   Renal insufficiency 09/06/2020    Respiratory disorder concurrent with and due to microscopic polyangiitis ([place]) 08/12/2021   Rheumatoid factor positive 03/29/2021   Sepsis ([place]) 04/19/2019   Skin cancer    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/17/2020   Transaminitis    Trigger finger 05/03/2018   Type 2 diabetes mellitus with hyperglycemia, with long-term current use of insulin ([place]) 04/23/2021   UC (ulcerative colitis) ([place])    UTI (urinary tract infection)    Vasculitis ([place]) 03/29/2021   Past Surgical History:  Procedure Laterality Date   ANGIOPLASTY  2010   BACK SURGERY     CATARACT EXTRACTION     COLONOSCOPY  06/16/2005   Mild colitis involving splenic flexure. Colonic polyps, status post polypectomy. Mild pancolonic diverticulitits. Internal hemorrhoids.    ESOPHAGOGASTRODUODENOSCOPY  04/26/2003   Irregular Z line suggestive of GERD. Mild gastritis status post CLO testing.    TOTAL KNEE ARTHROPLASTY Left 12/17/2020   Procedure: LEFT TOTAL KNEE ARTHROPLASTY;  Surgeon:  Leandrew Koyanagi, MD;  Location: [facility];  Service: Orthopedics;  Laterality: Left;   TRIGGER FINGER RELEASE      Family History  Problem Relation Age of Onset   Tuberculosis Mother    Stroke Father    Pancreatic cancer Sister    Heart attack Sister    Lung disease Sister    Clotting disorder Brother    Colon cancer Neg Hx    Esophageal cancer Neg Hx    Social History   Socioeconomic History   Marital status: Married    Spouse name: Not on file   Number of children: 2   Years of education: Not on file   Highest education level: Not on file  Occupational History   Occupation: retired  Tobacco Use   Smoking status: Never   Smokeless tobacco: Never  Vaping Use   Vaping Use: Never used  Substance and Sexual Activity   Alcohol use: Never   Drug use: Never   Sexual activity: Not on file  Other Topics Concern   Not on file  Social History Narrative   Not on file   Social Determinants of Health   Financial Resource Strain:  Low Risk  (10/28/2021)   Overall Financial Resource Strain (CARDIA)    Difficulty of Paying Living Expenses: Not hard at all  Food Insecurity: No Food Insecurity (06/07/2020)   Hunger Vital Sign    Worried About Running Out of Food in the Last Year: Never true    Ran Out of Food in the Last Year: Never true  Transportation Needs: No Transportation Needs (10/28/2021)   PRAPARE - Hydrologist (Medical): No    Lack of Transportation (Non-Medical): No  Physical Activity: Sufficiently Active (06/07/2020)   Exercise Vital Sign    Days of Exercise per Week: 5 days    Minutes of Exercise per Session: 30 min  Stress: Not on file  Social Connections: Not on file    Review of Systems  Constitutional:  Positive for fatigue. Negative for chills and fever.  HENT:  Negative for congestion, ear pain and sore throat.   Respiratory:  Negative for cough and shortness of breath.   Cardiovascular:  Negative for chest pain.  Gastrointestinal:  Negative for abdominal pain, constipation, diarrhea, nausea and vomiting.  Endocrine: Negative for polydipsia, polyphagia and polyuria.  Genitourinary:  Negative for dysuria and frequency.  Musculoskeletal:  Positive for arthralgias (left wrist, rt knee.). Negative for myalgias.  Neurological:  Negative for dizziness and headaches.  Psychiatric/Behavioral:  Negative for dysphoric mood.        No dysphoria     Objective:  BP (!) 110/58 (BP Location: Right Arm, Patient Position: Sitting)   Pulse (!) 52   Temp (!) 97.4 F (36.3 C) (Oral)   Ht '5\' 5"'$  (1.651 m)   Wt 182 lb (82.6 kg)   SpO2 90%   BMI 30.29 kg/m      03/14/2022   11:31 AM 02/19/2022    1:32 PM 02/05/2022    9:31 AM  BP/Weight  Systolic BP 962 229 798  Diastolic BP 58 78 62  Wt. (Lbs) 182 182.4 185  BMI 30.29 kg/m2 30.35 kg/m2 29.86 kg/m2    Physical Exam Vitals reviewed.  Constitutional:      Appearance: Normal appearance.  Neck:     Vascular: No carotid  bruit.  Cardiovascular:     Rate and Rhythm: Normal rate and regular rhythm.     Heart sounds:  Normal heart sounds.  Pulmonary:     Effort: Pulmonary effort is normal.     Breath sounds: Normal breath sounds. No wheezing, rhonchi or rales.  Abdominal:     General: Bowel sounds are normal.     Palpations: Abdomen is soft.     Tenderness: There is no abdominal tenderness.  Musculoskeletal:        General: Tenderness (rt knee. left wrist.) present.  Neurological:     Mental Status: He is alert and oriented to person, place, and time.  Psychiatric:        Mood and Affect: Mood normal.        Behavior: Behavior normal.     Diabetic Foot Exam - Simple   No data filed      Lab Results  Component Value Date   WBC 11.3 (H) 03/14/2022   HGB 14.6 03/14/2022   HCT 45.0 03/14/2022   PLT 454 (H) 03/14/2022   GLUCOSE 132 (H) 03/14/2022   CHOL 162 12/26/2021   TRIG 85 12/26/2021   HDL 59 12/26/2021   LDLCALC 87 12/26/2021   ALT 94 (H) 03/14/2022   AST 109 (H) 03/14/2022   NA 135 03/14/2022   K 4.9 03/14/2022   CL 99 03/14/2022   CREATININE 1.56 (H) 03/14/2022   BUN 19 03/14/2022   CO2 23 03/14/2022   TSH 1.320 03/14/2022   INR 1.1 12/13/2020   HGBA1C 7.4 (H) 12/26/2021   MICROALBUR 10 04/11/2021      Assessment & Plan:   Problem List Items Addressed This Visit       Cardiovascular and Mediastinum   Hypertensive heart disease without heart failure    Well controlled.  No changes to medicines.  Continue to work on eating a healthy diet and exercise.  Labs drawn today.       Relevant Orders   CBC with Differential/Platelet (Completed)   Comprehensive metabolic panel (Completed)   TSH (Completed)     Respiratory   Idiopathic pulmonary fibrosis ([place])    Keep follow up with pulmonology      Relevant Medications   cephALEXin (KEFLEX) 500 MG capsule   Chronic respiratory failure with hypoxia ([place])    Management per specialist.          Genitourinary    Chronic kidney disease, stage 3a ([place])    Management per specialist.  nephrology      Acute cystitis without hematuria - Primary    Ordered UA.      Relevant Orders   POCT urinalysis dipstick (Completed)     Other   Acute pain of right knee    Keep appt with Dr. Erlinda Hong       Pain in left wrist    Keep appt with Dr. Erlinda Hong     .  No orders of the defined types were placed in this encounter.   Orders Placed This Encounter  Procedures   CBC with Differential/Platelet   Comprehensive metabolic panel   TSH   POCT urinalysis dipstick     Follow-up: No follow-ups on file.  An After Visit Summary was printed and given to the patient.  [name], MD [facility] [phone]

## 2022-03-14 NOTE — Telephone Encounter (Signed)
Overnight oximetry on room air dated 03/12/2022 reviewed Time of study was 9 hours and 4 minutes Time spent less than 88% 7 hours 4 minutes Oxygen desaturation index 14.35  Please let patient know that his oxygen levels fell at night and he will need to continue supplemental oxygen at night

## 2022-03-14 NOTE — Telephone Encounter (Signed)
Called and spoke with patient's  wife [name] ([place]).  Dr. Matilde Bash results and recommendations given. Understanding stated.  Nothing further at this time.

## 2022-03-14 NOTE — Progress Notes (Unsigned)
Subjective:  Patient ID: Kerry Matthews, male    DOB: 1936-05-29  Age: 86 y.o. MRN: [id number]  Chief Complaint  Patient presents with   Hospitalization Follow-up    HPI   Current Outpatient Medications on File Prior to Visit  Medication Sig Dispense Refill   Alcohol Swabs (B-D SINGLE USE SWABS REGULAR) PADS 1 each by Does not apply route in the morning, at noon, in the evening, and at bedtime. 300 each 3   aspirin EC 81 MG tablet Take 81 mg by mouth daily. Swallow whole.     cephALEXin (KEFLEX) 500 MG capsule Take 500 mg by mouth 4 (four) times daily.     Coenzyme Q10 100 MG TABS Take 100 mg by mouth at bedtime.     furosemide (LASIX) 20 MG tablet Take 1 tablet (20 mg total) by mouth daily as needed. 90 tablet 1   gabapentin (NEURONTIN) 400 MG capsule Take 400 mg by mouth 2 (two) times daily.     glucose blood (TRUE METRIX BLOOD GLUCOSE TEST) test strip TEST BLOOD SUGAR THREE TIMES DAILY BEFORE MEALS 300 strip 3   insulin aspart (NOVOLOG FLEXPEN) 100 UNIT/ML FlexPen Up to 16 U per day Check sugar prior to each meal and give dose below. 100 - 150: 4 U. 151 - 200: + 2 U (6 U) 201 - 250: + 4 U (8 U) 251 - 300: + 6 U (10 U) 301 - 350: + 8 U (12 U) 350 - 400: + 10 U (14 U) > 400: + 12 U (16U) 15 mL 11   Insulin Glargine (TOUJEO MAX SOLOSTAR [place]) Inject 16 Units into the skin 2 (two) times daily.     Insulin Pen Needle (DROPLET PEN NEEDLES) 31G X 8 MM MISC Gives max of 5 insulin injections daily. 500 each 3   LANTUS 100 UNIT/ML injection Inject into the skin.     Multiple Vitamins-Minerals (PRESERVISION AREDS 2) CAPS Take 1 capsule by mouth 2 (two) times daily after a meal.     nitroGLYCERIN (NITROSTAT) 0.4 MG SL tablet Place 1 tablet (0.4 mg total) under the tongue every 5 (five) minutes as needed for chest pain. 25 tablet 9   omeprazole (PRILOSEC) 20 MG capsule Take 1 capsule (20 mg total) by mouth daily. 90 capsule 1   OXYGEN Inhale 1 L into the lungs daily. Using as needed during the day  and continuous at night.     rosuvastatin (CRESTOR) 20 MG tablet Take 1 tablet (20 mg total) by mouth daily. 90 tablet 3   vitamin B-12 (CYANOCOBALAMIN) 1000 MCG tablet Take 1 tablet (1,000 mcg total) by mouth daily. 30 tablet 0   No current facility-administered medications on file prior to visit.   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  ANCA antibodies- myeloperoxidase ABS-greater than 100, ANCA proteinase 3-less than 3.5 04/02/2019- ANCA titers- p-ANCA +1: 160, C ANCA-less than 1: 20, atypical p-ANCA titer-less than 1: 20   Abnormal CT of the chest    Abnormal findings on diagnostic imaging of lung 04/12/2019   04/01/2019-CT chest with contrast- no acute process in chest abdomen or pelvis, interstitial lung disease suspicious for early or mild UIP, pulmonary artery enlargement suggest PAH    Acute pain of left knee 11/30/2019   Alpha-1-antitrypsin deficiency carrier 05/04/2019   Atherosclerotic heart disease of native coronary artery without angina pectoris 05/03/2018   Body mass index (BMI) 29.0-29.9, adult 01/05/2020  Bradycardia    Chronic kidney disease, stage 3a ([place]) 08/12/2021   CKD (chronic kidney disease) 05/03/2018   Coronary artery disease    Coronary artery disease involving native coronary artery of native heart without angina pectoris 05/03/2018   Diabetes mellitus type 2 in obese ([place]) 04/01/2019   Diabetic polyneuropathy associated with type 2 diabetes mellitus ([place]) 08/12/2021   Dyslipidemia associated with type 2 diabetes mellitus ([place]) 05/03/2018   Elevated rheumatoid factor 04/12/2019   04/03/2019-rheumatoid factor-95.4   Essential hypertension 05/03/2018   GAD (generalized anxiety disorder)    GERD without esophagitis 05/03/2018   Hematuria 09/07/2020   History of kidney stones    Hypertensive heart disease without heart failure 05/03/2018   Hyponatremia 08/12/2021   Idiopathic pulmonary fibrosis ([place]) 03/29/2021    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   Insomnia 05/03/2018   Interstitial pulmonary disease ([place]) 04/12/2019   04/01/2019-CT chest with contrast- no acute process in chest abdomen or pelvis, interstitial lung disease suspicious for early or mild UIP, pulmonary artery enlargement suggest PAH  04/02/2019-connective tissue work-up: Anti-Jo 1-negative Anti-DNA antibody double-stranded-negative Anti-scleroderma antibody-negative Sjogren's syndrome antibody-negative Sjogren's syndrome antibody-negative CK-31 CCP-6 E   Leukocytosis 03/08/2020   Low vitamin B12 level 04/03/2019   Lower extremity edema    Medicare annual wellness visit, subsequent 08/09/2020   Microscopic polyangiitis ([place]) 03/29/2021   Mixed diabetic hyperlipidemia associated with type 2 diabetes mellitus ([place]) 08/12/2021   Mixed hyperlipidemia 05/03/2018   Myalgia 04/19/2019   Osteoarthritis    Other emphysema ([place])    Other long term (current) drug therapy 03/29/2021   Peripheral polyneuropathy 01/30/2020   Polymyositis ([place])    Primary insomnia    Proteinuria 09/07/2020   Renal insufficiency 09/06/2020   Respiratory disorder concurrent with and due to microscopic polyangiitis ([place]) 08/12/2021   Rheumatoid factor positive 03/29/2021   Sepsis ([place]) 04/19/2019   Skin cancer    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/17/2020   Transaminitis    Trigger finger 05/03/2018   Type 2 diabetes mellitus with hyperglycemia, with long-term current use of insulin ([place]) 04/23/2021   UC (ulcerative colitis) ([place])    UTI (urinary tract infection)    Vasculitis ([place]) 03/29/2021   Past Surgical History:  Procedure Laterality Date   ANGIOPLASTY  2010   BACK SURGERY     CATARACT EXTRACTION     COLONOSCOPY  06/16/2005   Mild colitis involving splenic flexure. Colonic polyps, status post polypectomy. Mild pancolonic diverticulitits. Internal hemorrhoids.    ESOPHAGOGASTRODUODENOSCOPY  04/26/2003   Irregular Z line suggestive of  GERD. Mild gastritis status post CLO testing.    TOTAL KNEE ARTHROPLASTY Left 12/17/2020   Procedure: LEFT TOTAL KNEE ARTHROPLASTY;  Surgeon: Leandrew Koyanagi, MD;  Location: [facility];  Service: Orthopedics;  Laterality: Left;   TRIGGER FINGER RELEASE      Family History  Problem Relation Age of Onset   Tuberculosis Mother    Stroke Father    Pancreatic cancer Sister    Heart attack Sister    Lung disease Sister    Clotting disorder Brother    Colon cancer Neg Hx    Esophageal cancer Neg Hx    Social History   Socioeconomic History   Marital status: Married    Spouse name: Not on file   Number of children: 2   Years of education: Not on file   Highest education level: Not on file  Occupational History   Occupation: retired  Tobacco Use   Smoking status:  Never   Smokeless tobacco: Never  Vaping Use   Vaping Use: Never used  Substance and Sexual Activity   Alcohol use: Never   Drug use: Never   Sexual activity: Not on file  Other Topics Concern   Not on file  Social History Narrative   Not on file   Social Determinants of Health   Financial Resource Strain: Low Risk  (10/28/2021)   Overall Financial Resource Strain (CARDIA)    Difficulty of Paying Living Expenses: Not hard at all  Food Insecurity: No Food Insecurity (06/07/2020)   Hunger Vital Sign    Worried About Running Out of Food in the Last Year: Never true    Ran Out of Food in the Last Year: Never true  Transportation Needs: No Transportation Needs (10/28/2021)   PRAPARE - Hydrologist (Medical): No    Lack of Transportation (Non-Medical): No  Physical Activity: Sufficiently Active (06/07/2020)   Exercise Vital Sign    Days of Exercise per Week: 5 days    Minutes of Exercise per Session: 30 min  Stress: Not on file  Social Connections: Not on file    Review of Systems  Constitutional:  Negative for appetite change, fatigue and fever.  HENT:  Negative for congestion, ear pain,  sinus pressure and sore throat.   Respiratory:  Positive for shortness of breath. Negative for cough, chest tightness and wheezing.   Cardiovascular:  Negative for chest pain and palpitations.  Gastrointestinal:  Negative for abdominal pain, constipation, diarrhea, nausea and vomiting.  Genitourinary:  Negative for dysuria and hematuria.  Musculoskeletal:  Positive for joint swelling (RIGHT LEG) and myalgias (LEFT WRIST). Negative for arthralgias and back pain.  Skin:  Negative for rash.  Neurological:  Negative for dizziness, weakness and headaches.  Psychiatric/Behavioral:  Negative for dysphoric mood. The patient is not nervous/anxious.      Objective:  BP (!) 110/58 (BP Location: Right Arm, Patient Position: Sitting)   Pulse (!) 52   Temp (!) 97.4 F (36.3 C) (Oral)   Ht '5\' 5"'$  (1.651 m)   Wt 182 lb (82.6 kg)   SpO2 90%   BMI 30.29 kg/m      03/14/2022   11:31 AM 02/19/2022    1:32 PM 02/05/2022    9:31 AM  BP/Weight  Systolic BP 789 381 017  Diastolic BP 58 78 62  Wt. (Lbs) 182 182.4 185  BMI 30.29 kg/m2 30.35 kg/m2 29.86 kg/m2    Physical Exam Vitals reviewed.  Constitutional:      Appearance: Normal appearance. He is normal weight.  Cardiovascular:     Rate and Rhythm: Normal rate and regular rhythm.     Pulses: Normal pulses.     Heart sounds: Normal heart sounds.  Pulmonary:     Effort: Pulmonary effort is normal.     Breath sounds: Normal breath sounds.  Abdominal:     General: Abdomen is flat. Bowel sounds are normal.     Palpations: Abdomen is soft.  Neurological:     Mental Status: He is alert and oriented to person, place, and time.  Psychiatric:        Mood and Affect: Mood normal.        Behavior: Behavior normal.     Diabetic Foot Exam - Simple   No data filed      Lab Results  Component Value Date   WBC 12.4 (H) 02/19/2022   HGB 15.3 02/19/2022   HCT 46.6  02/19/2022   PLT 265.0 02/19/2022   GLUCOSE 322 (H) 02/19/2022   CHOL 162  12/26/2021   TRIG 85 12/26/2021   HDL 59 12/26/2021   LDLCALC 87 12/26/2021   ALT 17 02/19/2022   AST 20 02/19/2022   NA 134 (L) 02/19/2022   K 4.8 02/19/2022   CL 100 02/19/2022   CREATININE 1.57 (H) 02/19/2022   BUN 27 (H) 02/19/2022   CO2 24 02/19/2022   TSH 2.830 04/04/2021   INR 1.1 12/13/2020   HGBA1C 7.4 (H) 12/26/2021   MICROALBUR 10 04/11/2021      Assessment & Plan:   Problem List Items Addressed This Visit   None .  No orders of the defined types were placed in this encounter.   No orders of the defined types were placed in this encounter.    Follow-up: No follow-ups on file.  An After Visit Summary was printed and given to the patient.   I,Lauren M Auman,acting as a scribe for [name], MD.,have documented all relevant documentation on the behalf of [name], MD,as directed by  [name], MD while in the presence of [name], MD.    [name], MD [facility] [phone]

## 2022-03-14 NOTE — Telephone Encounter (Signed)
ONO results received from [facility] via fax.  Results will be given to Dr. Vaughan Browner to review.

## 2022-03-14 NOTE — Telephone Encounter (Signed)
Called and left voicemail for patient to call office back in regards to ONO results.

## 2022-03-14 NOTE — Telephone Encounter (Signed)
Called and left patient detailed message to call back for ONO results.

## 2022-03-15 ENCOUNTER — Other Ambulatory Visit: Payer: Self-pay | Admitting: Legal Medicine

## 2022-03-15 LAB — CBC WITH DIFFERENTIAL/PLATELET
Basophils Absolute: 0.1 10*3/uL (ref 0.0–0.2)
Basos: 1 %
EOS (ABSOLUTE): 0.9 10*3/uL — ABNORMAL HIGH (ref 0.0–0.4)
Eos: 8 %
Hematocrit: 45 % (ref 37.5–51.0)
Hemoglobin: 14.6 g/dL (ref 13.0–17.7)
Immature Grans (Abs): 0.2 10*3/uL — ABNORMAL HIGH (ref 0.0–0.1)
Immature Granulocytes: 2 %
Lymphocytes Absolute: 2.6 10*3/uL (ref 0.7–3.1)
Lymphs: 23 %
MCH: 28.1 pg (ref 26.6–33.0)
MCHC: 32.4 g/dL (ref 31.5–35.7)
MCV: 87 fL (ref 79–97)
Monocytes Absolute: 1.1 10*3/uL — ABNORMAL HIGH (ref 0.1–0.9)
Monocytes: 9 %
Neutrophils Absolute: 6.4 10*3/uL (ref 1.4–7.0)
Neutrophils: 57 %
Platelets: 454 10*3/uL — ABNORMAL HIGH (ref 150–450)
RBC: 5.2 x10E6/uL (ref 4.14–5.80)
RDW: 12.9 % (ref 11.6–15.4)
WBC: 11.3 10*3/uL — ABNORMAL HIGH (ref 3.4–10.8)

## 2022-03-15 LAB — COMPREHENSIVE METABOLIC PANEL
ALT: 94 IU/L — ABNORMAL HIGH (ref 0–44)
AST: 109 IU/L — ABNORMAL HIGH (ref 0–40)
Albumin/Globulin Ratio: 1 — ABNORMAL LOW (ref 1.2–2.2)
Albumin: 2.8 g/dL — ABNORMAL LOW (ref 3.7–4.7)
Alkaline Phosphatase: 247 IU/L — ABNORMAL HIGH (ref 44–121)
BUN/Creatinine Ratio: 12 (ref 10–24)
BUN: 19 mg/dL (ref 8–27)
Bilirubin Total: 0.3 mg/dL (ref 0.0–1.2)
CO2: 23 mmol/L (ref 20–29)
Calcium: 8.3 mg/dL — ABNORMAL LOW (ref 8.6–10.2)
Chloride: 99 mmol/L (ref 96–106)
Creatinine, Ser: 1.56 mg/dL — ABNORMAL HIGH (ref 0.76–1.27)
Globulin, Total: 2.7 g/dL (ref 1.5–4.5)
Glucose: 132 mg/dL — ABNORMAL HIGH (ref 70–99)
Potassium: 4.9 mmol/L (ref 3.5–5.2)
Sodium: 135 mmol/L (ref 134–144)
Total Protein: 5.5 g/dL — ABNORMAL LOW (ref 6.0–8.5)
eGFR: 44 mL/min/{1.73_m2} — ABNORMAL LOW (ref >59)

## 2022-03-15 LAB — TSH: TSH: 1.32 u[IU]/mL (ref 0.450–4.500)

## 2022-03-16 DIAGNOSIS — N3000 Acute cystitis without hematuria: Secondary | ICD-10-CM | POA: Insufficient documentation

## 2022-03-16 NOTE — Assessment & Plan Note (Signed)
Ordered UA

## 2022-03-16 NOTE — Progress Notes (Signed)
Blood count abnormal. Wbc improved and platelets mildly up, but this is consistent with having an infection.  Liver function abnormal. Worsened. Kidney function continues to be abnormal.  Recheck CMP. Thyroid function normal.

## 2022-03-16 NOTE — Assessment & Plan Note (Signed)
Well controlled.  ?No changes to medicines.  ?Continue to work on eating a healthy diet and exercise.  ?Labs drawn today.  ?

## 2022-03-18 ENCOUNTER — Telehealth: Payer: Self-pay

## 2022-03-18 ENCOUNTER — Ambulatory Visit: Payer: Medicare HMO | Admitting: Orthopaedic Surgery

## 2022-03-18 ENCOUNTER — Other Ambulatory Visit: Payer: Self-pay

## 2022-03-18 ENCOUNTER — Encounter: Payer: Self-pay | Admitting: Orthopaedic Surgery

## 2022-03-18 DIAGNOSIS — M25561 Pain in right knee: Secondary | ICD-10-CM | POA: Insufficient documentation

## 2022-03-18 DIAGNOSIS — M25532 Pain in left wrist: Secondary | ICD-10-CM | POA: Diagnosis not present

## 2022-03-18 NOTE — Progress Notes (Signed)
Chronic Care Management Pharmacy Assistant   Name: Kerry Matthews  MRN: [id number] DOB: 1935/11/02   Reason for Encounter: Disease State call for DM    Recent office visits:  03/14/22 Kerry Brome MD. Seen for Acute Cystitis. No med changes.  02/05/22 Kerry Belfast NP. Seen for muscle spasm. Started on Cyclobenzaprine HCI '5mg'$ .   Recent consult visits:  02/19/22 Kerry Garfinkel MD. Seen for Interstitial Lung Disease. D/C Cyclobenzaprine '5mg'$ . We will start you on a medication called Ofev. Start tapering prednisone to 5 mg a day for 2 weeks and then stop altogether  Hospital visits:  Medication Reconciliation was completed by comparing discharge summary, patient's EMR and Pharmacy list, and upon discussion with patient.  Admitted to the hospital on 03/06/22 due to UTI. Discharge date was 03/11/22. Discharged from [name]?Medications Started at [name] Discharge:?? -started Cephalexin '500mg'$ , Lantus 100 units   Medications Discontinued at Hospital Discharge: -Stopped Cipro '500mg'$ , Insulin Glargine 100units, Toujeo Solostar    -All other medications will remain the same.    Medications: Outpatient Encounter Medications as of 03/18/2022  Medication Sig Note   Alcohol Swabs (B-D SINGLE USE SWABS REGULAR) PADS 1 each by Does not apply route in the morning, at noon, in the evening, and at bedtime.    aspirin EC 81 MG tablet Take 81 mg by mouth daily. Swallow whole.    cephALEXin (KEFLEX) 500 MG capsule Take 500 mg by mouth 4 (four) times daily.    Coenzyme Q10 100 MG TABS Take 100 mg by mouth at bedtime.    furosemide (LASIX) 20 MG tablet Take 1 tablet (20 mg total) by mouth daily as needed. 02/05/2022: Taking daily    gabapentin (NEURONTIN) 400 MG capsule TAKE 1 CAPSULE THREE TIMES DAILY    glucose blood (TRUE METRIX BLOOD GLUCOSE TEST) test strip TEST BLOOD SUGAR THREE TIMES DAILY BEFORE MEALS    insulin aspart (NOVOLOG FLEXPEN) 100 UNIT/ML FlexPen Up to 16 U per day  Check sugar prior to each meal and give dose below. 100 - 150: 4 U. 151 - 200: + 2 U (6 U) 201 - 250: + 4 U (8 U) 251 - 300: + 6 U (10 U) 301 - 350: + 8 U (12 U) 350 - 400: + 10 U (14 U) > 400: + 12 U (16U)    Insulin Glargine (TOUJEO MAX SOLOSTAR [place]) Inject 16 Units into the skin 2 (two) times daily.    Insulin Pen Needle (DROPLET PEN NEEDLES) 31G X 8 MM MISC Gives max of 5 insulin injections daily.    LANTUS 100 UNIT/ML injection Inject into the skin.    Multiple Vitamins-Minerals (PRESERVISION AREDS 2) CAPS Take 1 capsule by mouth 2 (two) times daily after a meal.    nitroGLYCERIN (NITROSTAT) 0.4 MG SL tablet Place 1 tablet (0.4 mg total) under the tongue every 5 (five) minutes as needed for chest pain.    omeprazole (PRILOSEC) 20 MG capsule Take 1 capsule (20 mg total) by mouth daily.    OXYGEN Inhale 1 L into the lungs daily. Using as needed during the day and continuous at night.    rosuvastatin (CRESTOR) 20 MG tablet Take 1 tablet (20 mg total) by mouth daily.    vitamin B-12 (CYANOCOBALAMIN) 1000 MCG tablet Take 1 tablet (1,000 mcg total) by mouth daily.    No facility-administered encounter medications on file as of 03/18/2022.    Recent Relevant Labs: Lab Results  Component Value Date/Time  HGBA1C 7.4 (H) 12/26/2021 08:31 AM   HGBA1C 6.3 (H) 08/07/2021 10:31 AM   MICROALBUR 10 04/11/2021 11:54 AM   MICROALBUR 10 03/06/2020 03:07 PM    Kidney Function Lab Results  Component Value Date/Time   CREATININE 1.56 (H) 03/14/2022 12:19 PM   CREATININE 1.57 (H) 02/19/2022 02:03 PM   GFR 40.27 (L) 02/19/2022 02:03 PM   GFRNONAA >60 08/23/2021 01:27 AM   GFRAA 55 (L) 07/26/2020 09:53 AM     Current antihyperglycemic regimen:  Toujeo Solostart 100units- Taking 12u in am and 4 u in pm Novolog 100units Up to 16 U per day Patient verbally confirms he is taking the above medications as directed. Yes  What recent interventions/DTPs have been made to improve glycemic control:  D/C  Prednisone and decreased Toujeo due to hypoglycemia readings   Have there been any recent hospitalizations or ED visits since last visit with CPP? Yes  Patient denies hypoglycemic symptoms, including None  Patient denies hyperglycemic symptoms, including none  How often are you checking your blood sugar? twice daily  What are your blood sugars ranging?  Fasting: 03/15/22 118 03/16/22 107 03/17/22 147 03/18/22 111 Before meals: 03/15/22 107 03/16/22 146 03/17/22 115 03/18/22 107 After meals: 03/15/22 145 03/17/23 118 03/17/22 151  On insulin? Yes How many units:12u in am and 4 u in pm and a sliding scale  During the week, how often does your blood glucose drop below 70? Never  Are you checking your feet daily/regularly? Yes  Adherence Review: Is the patient currently on a STATIN medication? Yes Is the patient currently on ACE/ARB medication? No Does the patient have >5 day gap between last estimated fill dates? CPP to review  Care Gaps: Last eye exam / Retinopathy Screening? 04/02/21 Last Annual Wellness Visit? None noted Last Diabetic Foot Exam? 12/26/21  Star Rating Drugs:  Medication:  Last Fill: Day Supply None  [name], [place] Pharmacist Assistant  [phone]

## 2022-03-18 NOTE — Progress Notes (Signed)
Office Visit Note   Patient: Kerry Matthews           Date of Birth: Jul 13, 1936           MRN: [id number] Visit Date: 03/18/2022              Requested by: [name], MD [address] [place],  [place] [address] PCP: [name], MD   Assessment & Plan: Visit Diagnoses:  1. Acute pain of right knee   2. Pain in left wrist     Plan: In regards to the right knee symptoms this is consistent with an arthritic flareup versus less likely gout.  I think it safe to inject cortisone at this time.  I only aspirated about 8 cc of joint fluid today which was sent to the lab.  In regards to the triquetral fracture he will increase activity as tolerated and wean the brace as tolerated.  Follow-Up Instructions: No follow-ups on file.   Orders:  Orders Placed This Encounter  Procedures   Anaerobic and Aerobic Culture   Synovial Fluid Analysis, Complete   No orders of the defined types were placed in this encounter.     Procedures: No procedures performed   Clinical Data: No additional findings.   Subjective: Chief Complaint  Patient presents with   Right Knee - Pain   Left Wrist - Pain    HPI Kerry Matthews is a 86 year old gentleman who is well-known to me from a left knee replacement that I did.  He comes in for evaluation of 2 separate problems.  He was hospitalized down near [facility] about 2 weeks ago for a UTI and then he subsequently developed a right knee effusion that was aspirated twice and negative for infection.  No cortisone was injected at that time.  MRI showed chondromalacia and synovitis without osteomyelitis.  He also had a fall which caused a triquetral avulsion fracture.  He is currently in thumb spica Velcro splint.  In regards to the right knee he is feeling better.  His range of motion has improved.  Denies any constitutional symptoms.  Review of Systems  Constitutional: Negative.   All other systems reviewed and are  negative.    Objective: Vital Signs: There were no vitals taken for this visit.  Physical Exam Vitals and nursing note reviewed.  Constitutional:      Appearance: He is well-developed.  Pulmonary:     Effort: Pulmonary effort is normal.  Abdominal:     Palpations: Abdomen is soft.  Skin:    General: Skin is warm.  Neurological:     Mental Status: He is alert and oriented to person, place, and time.  Psychiatric:        Behavior: Behavior normal.        Thought Content: Thought content normal.        Judgment: Judgment normal.     Ortho Exam Examination of the right knee shows a small effusion.  No signs of infection.  Slight joint line tenderness.  Examination of the left wrist shows some tenderness to the dorsum of the hand.  No neurovascular compromise. Specialty Comments:  No specialty comments available.  Imaging: No results found.   PMFS History: Patient Active Problem List   Diagnosis Date Noted   Acute pain of right knee 03/18/2022   Pain in left wrist 03/18/2022   Acute cystitis without hematuria 03/16/2022   Respiratory disorder concurrent with and due to microscopic polyangiitis ([place]) 08/12/2021  Hyponatremia 08/12/2021   Mixed diabetic hyperlipidemia associated with type 2 diabetes mellitus ([place]) 08/12/2021   Diabetic polyneuropathy associated with type 2 diabetes mellitus ([place]) 08/12/2021   Chronic kidney disease, stage 3a ([place]) 08/12/2021   Type 2 diabetes mellitus with hyperglycemia, with long-term current use of insulin ([place]) 04/23/2021   History of kidney stones 03/29/2021   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[place]) 03/29/2021   Microscopic polyangiitis ([place]) 03/29/2021   Rheumatoid factor positive 03/29/2021   Vasculitis ([place]) 03/29/2021   Idiopathic pulmonary fibrosis ([place]) 03/29/2021   Other long term (current) drug therapy 03/29/2021   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/17/2020   Dysuria 09/07/2020   GAD (generalized anxiety disorder)     Osteoarthritis    Other emphysema ([place])    Primary insomnia    Skin cancer    UC (ulcerative colitis) ([place])    Medicare annual wellness visit, subsequent 08/09/2020   Transaminitis 07/07/2020   Peripheral polyneuropathy 01/30/2020   Body mass index (BMI) 29.0-29.9, adult 01/05/2020   Alpha-1-antitrypsin deficiency carrier 05/04/2019   Myalgia 04/19/2019   Elevated rheumatoid factor 04/12/2019   Interstitial pulmonary disease ([place]) 04/12/2019   Abnormal ANCA test 04/12/2019   Abnormal findings on diagnostic imaging of lung 04/12/2019   Polymyositis ([place])    Low vitamin B12 level 04/03/2019   Abnormal CT of the chest    Diabetes mellitus type 2 in obese ([place]) 04/01/2019   Hypertensive heart disease without heart failure 05/03/2018   Mixed hyperlipidemia 05/03/2018   Coronary artery disease involving native coronary artery of native heart without angina pectoris 05/03/2018   GERD without esophagitis 05/03/2018   CKD (chronic kidney disease) 05/03/2018   Atherosclerotic heart disease of native coronary artery without angina pectoris 05/03/2018   Dyslipidemia associated with type 2 diabetes mellitus ([place]) 05/03/2018   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  ANCA antibodies- myeloperoxidase ABS-greater than 100, ANCA proteinase 3-less than 3.5 04/02/2019- ANCA titers- p-ANCA +1: 160, C ANCA-less than 1: 20, atypical p-ANCA titer-less than 1: 20   Abnormal CT of the chest    Abnormal findings on diagnostic imaging of lung 04/12/2019   04/01/2019-CT chest with contrast- no acute process in chest abdomen or pelvis, interstitial lung disease suspicious for early or mild UIP, pulmonary artery enlargement suggest PAH    Acute pain of left knee 11/30/2019   Alpha-1-antitrypsin deficiency carrier 05/04/2019   Atherosclerotic heart disease of native coronary artery without angina pectoris 05/03/2018   Body mass index (BMI) 29.0-29.9, adult  01/05/2020   Bradycardia    Chronic kidney disease, stage 3a ([place]) 08/12/2021   CKD (chronic kidney disease) 05/03/2018   Coronary artery disease    Coronary artery disease involving native coronary artery of native heart without angina pectoris 05/03/2018   Diabetes mellitus type 2 in obese ([place]) 04/01/2019   Diabetic polyneuropathy associated with type 2 diabetes mellitus ([place]) 08/12/2021   Dyslipidemia associated with type 2 diabetes mellitus ([place]) 05/03/2018   Elevated rheumatoid factor 04/12/2019   04/03/2019-rheumatoid factor-95.4   Essential hypertension 05/03/2018   GAD (generalized anxiety disorder)    GERD without esophagitis 05/03/2018   Hematuria 09/07/2020   History of kidney stones    Hypertensive heart disease without heart failure 05/03/2018   Hyponatremia 08/12/2021   Idiopathic pulmonary fibrosis ([place]) 03/29/2021   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   Insomnia 05/03/2018   Interstitial pulmonary disease ([place]) 04/12/2019   04/01/2019-CT chest with contrast- no acute  process in chest abdomen or pelvis, interstitial lung disease suspicious for early or mild UIP, pulmonary artery enlargement suggest PAH  04/02/2019-connective tissue work-up: Anti-Jo 1-negative Anti-DNA antibody double-stranded-negative Anti-scleroderma antibody-negative Sjogren's syndrome antibody-negative Sjogren's syndrome antibody-negative CK-31 CCP-6 E   Leukocytosis 03/08/2020   Low vitamin B12 level 04/03/2019   Lower extremity edema    Medicare annual wellness visit, subsequent 08/09/2020   Microscopic polyangiitis ([place]) 03/29/2021   Mixed diabetic hyperlipidemia associated with type 2 diabetes mellitus ([place]) 08/12/2021   Mixed hyperlipidemia 05/03/2018   Myalgia 04/19/2019   Osteoarthritis    Other emphysema ([place])    Other long term (current) drug therapy 03/29/2021   Peripheral polyneuropathy 01/30/2020   Polymyositis ([place])    Primary insomnia    Proteinuria 09/07/2020   Renal  insufficiency 09/06/2020   Respiratory disorder concurrent with and due to microscopic polyangiitis ([place]) 08/12/2021   Rheumatoid factor positive 03/29/2021   Sepsis ([place]) 04/19/2019   Skin cancer    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/17/2020   Transaminitis    Trigger finger 05/03/2018   Type 2 diabetes mellitus with hyperglycemia, with long-term current use of insulin ([place]) 04/23/2021   UC (ulcerative colitis) ([place])    UTI (urinary tract infection)    Vasculitis ([place]) 03/29/2021    Family History  Problem Relation Age of Onset   Tuberculosis Mother    Stroke Father    Pancreatic cancer Sister    Heart attack Sister    Lung disease Sister    Clotting disorder Brother    Colon cancer Neg Hx    Esophageal cancer Neg Hx     Past Surgical History:  Procedure Laterality Date   ANGIOPLASTY  2010   BACK SURGERY     CATARACT EXTRACTION     COLONOSCOPY  06/16/2005   Mild colitis involving splenic flexure. Colonic polyps, status post polypectomy. Mild pancolonic diverticulitits. Internal hemorrhoids.    ESOPHAGOGASTRODUODENOSCOPY  04/26/2003   Irregular Z line suggestive of GERD. Mild gastritis status post CLO testing.    TOTAL KNEE ARTHROPLASTY Left 12/17/2020   Procedure: LEFT TOTAL KNEE ARTHROPLASTY;  Surgeon: Leandrew Koyanagi, MD;  Location: [facility];  Service: Orthopedics;  Laterality: Left;   TRIGGER FINGER RELEASE     Social History   Occupational History   Occupation: retired  Tobacco Use   Smoking status: Never   Smokeless tobacco: Never  Vaping Use   Vaping Use: Never used  Substance and Sexual Activity   Alcohol use: Never   Drug use: Never   Sexual activity: Not on file

## 2022-03-19 NOTE — Telephone Encounter (Signed)
Spoke with patient's wife, Kerry Matthews. She states patient is home from hospital - receiving home health. She states she has not yet had chance to collect income documents due to ongoing health coordination for patient since discharge. She states she will work on collecting income documents for [employer] patient assistance application (SS letter and pension check copies)  Knox Saliva, PharmD, MPH, BCPS, CPP Clinical Pharmacist (Rheumatology and Pulmonology)

## 2022-03-23 NOTE — Assessment & Plan Note (Signed)
Keep follow up with pulmonology

## 2022-03-23 NOTE — Assessment & Plan Note (Signed)
Management per specialist.  nephrology

## 2022-03-23 NOTE — Assessment & Plan Note (Signed)
Keep appt with Dr. Erlinda Hong

## 2022-03-23 NOTE — Assessment & Plan Note (Signed)
Management per specialist. 

## 2022-03-24 DIAGNOSIS — J988 Other specified respiratory disorders: Secondary | ICD-10-CM | POA: Diagnosis not present

## 2022-03-24 DIAGNOSIS — J189 Pneumonia, unspecified organism: Secondary | ICD-10-CM | POA: Diagnosis not present

## 2022-03-24 DIAGNOSIS — M317 Microscopic polyangiitis: Secondary | ICD-10-CM | POA: Diagnosis not present

## 2022-03-24 LAB — SYNOVIAL FLUID ANALYSIS, COMPLETE
Basophils, %: 0 %
Eosinophils-Synovial: 0 % (ref 0–2)
Lymphocytes-Synovial Fld: 21 % (ref 0–74)
Monocyte/Macrophage: 23 % (ref 0–69)
Neutrophil, Synovial: 56 % — ABNORMAL HIGH (ref 0–24)
Synoviocytes, %: 0 % (ref 0–15)
WBC, Synovial: 1491 cells/uL — ABNORMAL HIGH (ref <150)

## 2022-03-24 LAB — ANAEROBIC AND AEROBIC CULTURE
AER RESULT:: NO GROWTH
MICRO NUMBER:: 13691358
MICRO NUMBER:: 13691359
SPECIMEN QUALITY:: ADEQUATE
SPECIMEN QUALITY:: ADEQUATE

## 2022-03-27 DIAGNOSIS — M25561 Pain in right knee: Secondary | ICD-10-CM | POA: Diagnosis not present

## 2022-03-27 DIAGNOSIS — F419 Anxiety disorder, unspecified: Secondary | ICD-10-CM

## 2022-03-27 DIAGNOSIS — K219 Gastro-esophageal reflux disease without esophagitis: Secondary | ICD-10-CM

## 2022-03-27 DIAGNOSIS — E871 Hypo-osmolality and hyponatremia: Secondary | ICD-10-CM | POA: Diagnosis not present

## 2022-03-27 DIAGNOSIS — I2510 Atherosclerotic heart disease of native coronary artery without angina pectoris: Secondary | ICD-10-CM

## 2022-03-27 DIAGNOSIS — E1169 Type 2 diabetes mellitus with other specified complication: Secondary | ICD-10-CM | POA: Diagnosis not present

## 2022-03-27 DIAGNOSIS — M25461 Effusion, right knee: Secondary | ICD-10-CM | POA: Diagnosis not present

## 2022-03-27 DIAGNOSIS — E1122 Type 2 diabetes mellitus with diabetic chronic kidney disease: Secondary | ICD-10-CM | POA: Diagnosis not present

## 2022-03-27 DIAGNOSIS — I129 Hypertensive chronic kidney disease with stage 1 through stage 4 chronic kidney disease, or unspecified chronic kidney disease: Secondary | ICD-10-CM | POA: Diagnosis not present

## 2022-03-27 DIAGNOSIS — J449 Chronic obstructive pulmonary disease, unspecified: Secondary | ICD-10-CM

## 2022-03-27 DIAGNOSIS — E669 Obesity, unspecified: Secondary | ICD-10-CM | POA: Diagnosis not present

## 2022-03-27 DIAGNOSIS — M359 Systemic involvement of connective tissue, unspecified: Secondary | ICD-10-CM | POA: Diagnosis not present

## 2022-03-27 DIAGNOSIS — N1831 Chronic kidney disease, stage 3a: Secondary | ICD-10-CM | POA: Diagnosis not present

## 2022-03-28 NOTE — Progress Notes (Signed)
Subjective:  Patient ID: Kerry Matthews, male    DOB: [birth year]  Age: 86 y.o. MRN: [id number]  Chief Complaint  Patient presents with   Diabetes   Hyperlipidemia   Hypertension   HPI: Diabetes:  Complications: neuropathy Glucose checking: before meals and before bed  Glucose logs: AM: 107-156, Lunch 107-158, Supper 114-151, before bed 88-170.  Hypoglycemia: 88 was the lowest.  Current medications: Novolog ssi before lunch and supper if above a certain amount. Taking 1-2 times a day at most. Toujeo 12 in am and 4 U at night.  Patient has tapered off prednisone and gabapentin 400 mg one twice a day helps with neuropathy. Last Eye Exam: 04/02/2021. Needs to make an appointment Foot checks: daily.    Hyperlipidemia: Current medications: rosuvastatin 20 mg once daily and coenzyme q10.    Hypertensive heart disease. Has history of stent  Current medications: lasix 20 mg once daily as needed, aspirin 81 mg daily, and crestor 20 mg once daily.     Diet: eating healthy. Exercise: home physical therapy and OT.      Microscopic polyangitis: pulmonary and rheumatology. on rituxin 100 mg every 6 months.   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 Sees pulmonology.Marland Kitchen wears oxygen at night 1 L. Wears during the day and uses it sometimes.    B12 deficiency: otc b12 1000 mcg once daily.    Current Outpatient Medications on File Prior to Visit  Medication Sig Dispense Refill   Alcohol Swabs (B-D SINGLE USE SWABS REGULAR) PADS 1 each by Does not apply route in the morning, at noon, in the evening, and at bedtime. 300 each 3   aspirin EC 81 MG tablet Take 81 mg by mouth daily. Swallow whole.     Coenzyme Q10 100 MG TABS Take 100 mg by mouth at bedtime.     furosemide (LASIX) 20 MG tablet Take 1 tablet (20 mg total) by mouth daily as needed. 90 tablet 1   gabapentin (NEURONTIN) 400 MG capsule TAKE 1 CAPSULE THREE TIMES DAILY 270 capsule 0   glucose blood (TRUE METRIX BLOOD  GLUCOSE TEST) test strip TEST BLOOD SUGAR THREE TIMES DAILY BEFORE MEALS 300 strip 3   insulin aspart (NOVOLOG FLEXPEN) 100 UNIT/ML FlexPen Up to 16 U per day Check sugar prior to each meal and give dose below. 100 - 150: 4 U. 151 - 200: + 2 U (6 U) 201 - 250: + 4 U (8 U) 251 - 300: + 6 U (10 U) 301 - 350: + 8 U (12 U) 350 - 400: + 10 U (14 U) > 400: + 12 U (16U) 15 mL 11   Insulin Glargine (TOUJEO MAX SOLOSTAR [place]) Inject 16 Units into the skin 2 (two) times daily.     Insulin Pen Needle (DROPLET PEN NEEDLES) 31G X 8 MM MISC Gives max of 5 insulin injections daily. 500 each 3   Multiple Vitamins-Minerals (PRESERVISION AREDS 2) CAPS Take 1 capsule by mouth 2 (two) times daily after a meal.     nitroGLYCERIN (NITROSTAT) 0.4 MG SL tablet Place 1 tablet (0.4 mg total) under the tongue every 5 (five) minutes as needed for chest pain. 25 tablet 9   omeprazole (PRILOSEC) 20 MG capsule Take 1 capsule (20 mg total) by mouth daily. 90 capsule 1   OXYGEN Inhale 1 L into the lungs daily. Using as needed during the day and continuous at night.     rosuvastatin (CRESTOR) 20 MG tablet Take 1 tablet (20 mg total)  by mouth daily. 90 tablet 3   vitamin B-12 (CYANOCOBALAMIN) 1000 MCG tablet Take 1 tablet (1,000 mcg total) by mouth daily. 30 tablet 0   No current facility-administered medications on file prior to visit.   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  ANCA antibodies- myeloperoxidase ABS-greater than 100, ANCA proteinase 3-less than 3.5 04/02/2019- ANCA titers- p-ANCA +1: 160, C ANCA-less than 1: 20, atypical p-ANCA titer-less than 1: 20   Abnormal CT of the chest    Abnormal findings on diagnostic imaging of lung 04/12/2019   04/01/2019-CT chest with contrast- no acute process in chest abdomen or pelvis, interstitial lung disease suspicious for early or mild UIP, pulmonary artery enlargement suggest PAH    Acute pain of left knee 11/30/2019    Alpha-1-antitrypsin deficiency carrier 05/04/2019   Atherosclerotic heart disease of native coronary artery without angina pectoris 05/03/2018   Body mass index (BMI) 29.0-29.9, adult 01/05/2020   Bradycardia    Chronic kidney disease, stage 3a ([place]) 08/12/2021   CKD (chronic kidney disease) 05/03/2018   Coronary artery disease    Coronary artery disease involving native coronary artery of native heart without angina pectoris 05/03/2018   Diabetes mellitus type 2 in obese ([place]) 04/01/2019   Diabetic polyneuropathy associated with type 2 diabetes mellitus ([place]) 08/12/2021   Dyslipidemia associated with type 2 diabetes mellitus ([place]) 05/03/2018   Elevated rheumatoid factor 04/12/2019   04/03/2019-rheumatoid factor-95.4   Essential hypertension 05/03/2018   GAD (generalized anxiety disorder)    GERD without esophagitis 05/03/2018   Hematuria 09/07/2020   History of kidney stones    Hypertensive heart disease without heart failure 05/03/2018   Hyponatremia 08/12/2021   Idiopathic pulmonary fibrosis ([place]) 03/29/2021   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/29/2021   Insomnia 05/03/2018   Interstitial pulmonary disease ([place]) 04/12/2019   04/01/2019-CT chest with contrast- no acute process in chest abdomen or pelvis, interstitial lung disease suspicious for early or mild UIP, pulmonary artery enlargement suggest PAH  04/02/2019-connective tissue work-up: Anti-Jo 1-negative Anti-DNA antibody double-stranded-negative Anti-scleroderma antibody-negative Sjogren's syndrome antibody-negative Sjogren's syndrome antibody-negative CK-31 CCP-6 E   Leukocytosis 03/08/2020   Low vitamin B12 level 04/03/2019   Lower extremity edema    Medicare annual wellness visit, subsequent 08/09/2020   Microscopic polyangiitis ([place]) 03/29/2021   Mixed diabetic hyperlipidemia associated with type 2 diabetes mellitus ([place]) 08/12/2021   Mixed hyperlipidemia 05/03/2018   Myalgia 04/19/2019   Osteoarthritis    Other emphysema ([place])     Other long term (current) drug therapy 03/29/2021   Peripheral polyneuropathy 01/30/2020   Polymyositis ([place])    Primary insomnia    Proteinuria 09/07/2020   Renal insufficiency 09/06/2020   Respiratory disorder concurrent with and due to microscopic polyangiitis ([place]) 08/12/2021   Rheumatoid factor positive 03/29/2021   Sepsis ([place]) 04/19/2019   Skin cancer    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/17/2020   Transaminitis    Trigger finger 05/03/2018   Type 2 diabetes mellitus with hyperglycemia, with long-term current use of insulin ([place]) 04/23/2021   UC (ulcerative colitis) ([place])    UTI (urinary tract infection)    Vasculitis ([place]) 03/29/2021   Past Surgical History:  Procedure Laterality Date   ANGIOPLASTY  2010   BACK SURGERY     CATARACT EXTRACTION     COLONOSCOPY  06/16/2005   Mild colitis involving splenic flexure. Colonic polyps, status post polypectomy. Mild pancolonic diverticulitits. Internal hemorrhoids.    ESOPHAGOGASTRODUODENOSCOPY  04/26/2003   Irregular Z line suggestive of GERD. Mild gastritis status post CLO testing.    TOTAL KNEE ARTHROPLASTY Left 12/17/2020   Procedure: LEFT TOTAL KNEE ARTHROPLASTY;  Surgeon: Leandrew Koyanagi, MD;  Location: [facility];  Service: Orthopedics;  Laterality: Left;   TRIGGER FINGER RELEASE      Family History  Problem Relation Age of Onset   Tuberculosis Mother    Stroke Father    Pancreatic cancer Sister    Heart attack Sister    Lung disease Sister    Clotting disorder Brother    Colon cancer Neg Hx    Esophageal cancer Neg Hx    Social History   Socioeconomic History   Marital status: Married    Spouse name: Not on file   Number of children: 2   Years of education: Not on file   Highest education level: Not on file  Occupational History   Occupation: retired  Tobacco Use   Smoking status: Never   Smokeless tobacco: Never  Vaping Use   Vaping Use: Never used  Substance and Sexual Activity   Alcohol use:  Never   Drug use: Never   Sexual activity: Not on file  Other Topics Concern   Not on file  Social History Narrative   Not on file   Social Determinants of Health   Financial Resource Strain: Low Risk  (10/28/2021)   Overall Financial Resource Strain (CARDIA)    Difficulty of Paying Living Expenses: Not hard at all  Food Insecurity: No Food Insecurity (06/07/2020)   Hunger Vital Sign    Worried About Running Out of Food in the Last Year: Never true    Ran Out of Food in the Last Year: Never true  Transportation Needs: No Transportation Needs (10/28/2021)   PRAPARE - Hydrologist (Medical): No    Lack of Transportation (Non-Medical): No  Physical Activity: Sufficiently Active (06/07/2020)   Exercise Vital Sign    Days of Exercise per Week: 5 days    Minutes of Exercise per Session: 30 min  Stress: Not on file  Social Connections: Not on file    Review of Systems  Constitutional:  Positive for fatigue. Negative for appetite change and fever.  HENT:  Negative for congestion, ear pain, sinus pressure and sore throat.   Respiratory:  Positive for cough and shortness of breath. Negative for chest tightness and wheezing.   Cardiovascular:  Negative for chest pain and palpitations.  Gastrointestinal:  Positive for abdominal pain. Negative for constipation, diarrhea, nausea and vomiting.       Three episodes of epigastric pain after eating over the last week.  Genitourinary:  Negative for dysuria and hematuria.  Musculoskeletal:  Negative for arthralgias, back pain, joint swelling and myalgias.  Skin:  Negative for rash.  Neurological:  Positive for weakness (improving with physical therapy and OT.). Negative for dizziness and headaches.  Psychiatric/Behavioral:  Negative for dysphoric mood. The patient is not nervous/anxious.      Objective:  BP 110/68   Pulse 84   Temp (!) 97 F (36.1 C)   Resp 18   Ht '5\' 6"'$  (1.676 m)   Wt 176 lb (79.8 kg)   BMI  28.41 kg/m      03/31/2022    7:38 AM 03/14/2022   11:31 AM 02/19/2022    1:32 PM  BP/Weight  Systolic BP 932 671 245  Diastolic BP 68 58 78  Wt. (Lbs) 176 182 182.4  BMI 28.41 kg/m2 30.29 kg/m2 30.35 kg/m2    Physical Exam Vitals reviewed.  Constitutional:      Appearance: Normal appearance. He is normal weight.  Cardiovascular:     Rate and Rhythm: Normal rate and regular rhythm.     Heart sounds: Normal heart sounds.  Pulmonary:     Effort: Respiratory distress present.     Breath sounds: Wheezing and rales present.  Abdominal:     General: Abdomen is flat. Bowel sounds are normal.     Palpations: Abdomen is soft.     Tenderness: There is no abdominal tenderness.  Neurological:     Mental Status: He is alert and oriented to person, place, and time.  Psychiatric:        Mood and Affect: Mood normal.        Behavior: Behavior normal.     Diabetic Foot Exam - Simple   Simple Foot Form Diabetic Foot exam was performed with the following findings: Yes 03/31/2022  8:29 AM  Visual Inspection See comments: Yes Sensation Testing Intact to touch and monofilament testing bilaterally: Yes Pulse Check Posterior Tibialis and Dorsalis pulse intact bilaterally: Yes Comments Thickened nails.  Calluses.       Lab Results  Component Value Date   WBC 11.3 (H) 03/14/2022   HGB 14.6 03/14/2022   HCT 45.0 03/14/2022   PLT 454 (H) 03/14/2022   GLUCOSE 132 (H) 03/14/2022   CHOL 162 12/26/2021   TRIG 85 12/26/2021   HDL 59 12/26/2021   LDLCALC 87 12/26/2021   ALT 94 (H) 03/14/2022   AST 109 (H) 03/14/2022   NA 135 03/14/2022   K 4.9 03/14/2022   CL 99 03/14/2022   CREATININE 1.56 (H) 03/14/2022   BUN 19 03/14/2022   CO2 23 03/14/2022   TSH 1.320 03/14/2022   INR 1.1 12/13/2020   HGBA1C 7.4 (H) 12/26/2021   MICROALBUR 10 04/11/2021      Assessment & Plan:   Problem List Items Addressed This Visit       Cardiovascular and Mediastinum   Hypertensive heart disease  without heart failure - Primary    The current medical regimen is effective;  continue present plan and medications. Management per specialist. Dr. Geraldo Pitter.        Relevant Orders   CBC With Diff/Platelet   Comprehensive metabolic panel   Coronary artery disease involving native coronary artery of native heart without angina pectoris    The current medical regimen is effective;  continue present plan and medications. Currently on  lasix 20 mg once daily as needed, aspirin 81 mg daily, and crestor 20 mg once daily.        Microscopic polyangiitis ([place])    Management per specialist.  Nephrology.  Off prednisone.         Respiratory   Idiopathic pulmonary fibrosis [facility])    Pulmonology working on getting Ofev. Management per Dr. Vaughan Browner.        Chronic respiratory failure with hypoxia ([place])    Utilizes oxygen at 1 L at night.  Management per specialist.  Dr. Vaughan Browner.        Endocrine   Diabetic polyneuropathy associated with type 2 diabetes mellitus ([place])    Control: fairly good Recommend check sugars before meals and before bed.  Recommend check feet daily. Recommend annual eye exams. Medicines: No changes at this time. Continue: Novolog ssi before lunch and supper if above a certain amount. Taking 1-2 times a day at most. Toujeo 12  in am and 4 U at night.   Continue to work on eating a healthy diet and exercise.  Labs drawn today.         Relevant Orders   Hemoglobin A1c     Genitourinary   Chronic kidney disease, stage 3a ([place])    Management per specialist.          Other   Mixed hyperlipidemia    Well controlled.  No changes to medicines. Continue rosuvastatin 20 mg once daily and coenzyme q10.  Continue to work on eating a healthy diet and exercise.  Labs drawn today.        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 Trudie Reed.       Other Visit Diagnoses     Epigastric  abdominal pain       Relevant Orders   HELICOBACTER PYLORI  ANTIBODY, IGM     .  No orders of the defined types were placed in this encounter.   Orders Placed This Encounter  Procedures   CBC With Diff/Platelet   Comprehensive metabolic panel   Lipid panel   Hemoglobin Q3R   HELICOBACTER PYLORI  ANTIBODY, IGM    Total time spent on today's visit was greater than 30 minutes, including both face-to-face time and nonface-to-face time personally spent on review of chart (labs and imaging), discussing labs and goals, discussing further work-up, treatment options, referrals to specialist if needed, reviewing outside records of pertinent, answering patient's questions, and coordinating care.  Follow-up: Return in about 3 months (around 07/01/2022) for chronic fasting.  An After Visit Summary was printed and given to the patient.  [name], MD [facility] [phone]

## 2022-03-31 ENCOUNTER — Encounter: Payer: Self-pay | Admitting: Family Medicine

## 2022-03-31 ENCOUNTER — Ambulatory Visit (INDEPENDENT_AMBULATORY_CARE_PROVIDER_SITE_OTHER): Payer: Medicare HMO | Admitting: Family Medicine

## 2022-03-31 VITALS — BP 110/68 | HR 84 | Temp 97.0°F | Resp 18 | Ht 66.0 in | Wt 176.0 lb

## 2022-03-31 DIAGNOSIS — N1831 Chronic kidney disease, stage 3a: Secondary | ICD-10-CM | POA: Diagnosis not present

## 2022-03-31 DIAGNOSIS — R1013 Epigastric pain: Secondary | ICD-10-CM | POA: Diagnosis not present

## 2022-03-31 DIAGNOSIS — M317 Microscopic polyangiitis: Secondary | ICD-10-CM | POA: Diagnosis not present

## 2022-03-31 DIAGNOSIS — J9611 Chronic respiratory failure with hypoxia: Secondary | ICD-10-CM | POA: Diagnosis not present

## 2022-03-31 DIAGNOSIS — I2510 Atherosclerotic heart disease of native coronary artery without angina pectoris: Secondary | ICD-10-CM | POA: Diagnosis not present

## 2022-03-31 DIAGNOSIS — J84112 Idiopathic pulmonary fibrosis: Secondary | ICD-10-CM | POA: Diagnosis not present

## 2022-03-31 DIAGNOSIS — E1142 Type 2 diabetes mellitus with diabetic polyneuropathy: Secondary | ICD-10-CM

## 2022-03-31 DIAGNOSIS — E782 Mixed hyperlipidemia: Secondary | ICD-10-CM

## 2022-03-31 DIAGNOSIS — I119 Hypertensive heart disease without heart failure: Secondary | ICD-10-CM | POA: Diagnosis not present

## 2022-03-31 DIAGNOSIS — D8989 Other specified disorders involving the immune mechanism, not elsewhere classified: Secondary | ICD-10-CM

## 2022-03-31 NOTE — Assessment & Plan Note (Signed)
Pulmonology working on getting Ofev. Management per Dr. Vaughan Browner.

## 2022-03-31 NOTE — Assessment & Plan Note (Signed)
Utilizes oxygen at 1 L at night.  Management per specialist.  Dr. Vaughan Browner.

## 2022-03-31 NOTE — Assessment & Plan Note (Signed)
Management per specialist. 

## 2022-03-31 NOTE — Assessment & Plan Note (Signed)
The current medical regimen is effective;  continue present plan and medications. Management per specialist. Dr. Geraldo Pitter.

## 2022-03-31 NOTE — Assessment & Plan Note (Signed)
Managed by dr. Trudie Reed.

## 2022-03-31 NOTE — Assessment & Plan Note (Signed)
Management per specialist.  Nephrology.  Off prednisone.

## 2022-03-31 NOTE — Assessment & Plan Note (Signed)
The current medical regimen is effective;  continue present plan and medications. Currently on  lasix 20 mg once daily as needed, aspirin 81 mg daily, and crestor 20 mg once daily.

## 2022-03-31 NOTE — Assessment & Plan Note (Signed)
Well controlled.  No changes to medicines. Continue rosuvastatin 20 mg once daily and coenzyme q10.  Continue to work on eating a healthy diet and exercise.  Labs drawn today.

## 2022-03-31 NOTE — Assessment & Plan Note (Signed)
Control: fairly good Recommend check sugars before meals and before bed.  Recommend check feet daily. Recommend annual eye exams. Medicines: No changes at this time. Continue: Novolog ssi before lunch and supper if above a certain amount. Taking 1-2 times a day at most. Toujeo 12 in am and 4 U at night.   Continue to work on eating a healthy diet and exercise.  Labs drawn today.

## 2022-04-01 ENCOUNTER — Telehealth: Payer: Self-pay | Admitting: Pulmonary Disease

## 2022-04-01 ENCOUNTER — Other Ambulatory Visit (HOSPITAL_COMMUNITY): Payer: Self-pay

## 2022-04-01 LAB — COMPREHENSIVE METABOLIC PANEL
ALT: 17 IU/L (ref 0–44)
AST: 18 IU/L (ref 0–40)
Albumin/Globulin Ratio: 1.6 (ref 1.2–2.2)
Albumin: 4 g/dL (ref 3.7–4.7)
Alkaline Phosphatase: 156 IU/L — ABNORMAL HIGH (ref 44–121)
BUN/Creatinine Ratio: 12 (ref 10–24)
BUN: 16 mg/dL (ref 8–27)
Bilirubin Total: 0.7 mg/dL (ref 0.0–1.2)
CO2: 22 mmol/L (ref 20–29)
Calcium: 9.9 mg/dL (ref 8.6–10.2)
Chloride: 98 mmol/L (ref 96–106)
Creatinine, Ser: 1.29 mg/dL — ABNORMAL HIGH (ref 0.76–1.27)
Globulin, Total: 2.5 g/dL (ref 1.5–4.5)
Glucose: 104 mg/dL — ABNORMAL HIGH (ref 70–99)
Potassium: 5.2 mmol/L (ref 3.5–5.2)
Sodium: 138 mmol/L (ref 134–144)
Total Protein: 6.5 g/dL (ref 6.0–8.5)
eGFR: 55 mL/min/{1.73_m2} — ABNORMAL LOW (ref >59)

## 2022-04-01 LAB — CBC WITH DIFF/PLATELET
Basophils Absolute: 0.2 10*3/uL (ref 0.0–0.2)
Basos: 1 %
EOS (ABSOLUTE): 0.9 10*3/uL — ABNORMAL HIGH (ref 0.0–0.4)
Eos: 6 %
Hematocrit: 47.3 % (ref 37.5–51.0)
Hemoglobin: 15.4 g/dL (ref 13.0–17.7)
Immature Grans (Abs): 0.1 10*3/uL (ref 0.0–0.1)
Immature Granulocytes: 1 %
Lymphocytes Absolute: 4.1 10*3/uL — ABNORMAL HIGH (ref 0.7–3.1)
Lymphs: 28 %
MCH: 28.7 pg (ref 26.6–33.0)
MCHC: 32.6 g/dL (ref 31.5–35.7)
MCV: 88 fL (ref 79–97)
Monocytes Absolute: 1.2 10*3/uL — ABNORMAL HIGH (ref 0.1–0.9)
Monocytes: 8 %
Neutrophils Absolute: 8.1 10*3/uL — ABNORMAL HIGH (ref 1.4–7.0)
Neutrophils: 56 %
Platelets: 317 10*3/uL (ref 150–450)
RBC: 5.37 x10E6/uL (ref 4.14–5.80)
RDW: 13.3 % (ref 11.6–15.4)
WBC: 14.5 10*3/uL — ABNORMAL HIGH (ref 3.4–10.8)

## 2022-04-01 LAB — HEMOGLOBIN A1C
Est. average glucose Bld gHb Est-mCnc: 143 mg/dL
Hgb A1c MFr Bld: 6.6 % — ABNORMAL HIGH (ref 4.8–5.6)

## 2022-04-01 LAB — LIPID PANEL
Chol/HDL Ratio: 3.8 ratio (ref 0.0–5.0)
Cholesterol, Total: 162 mg/dL (ref 100–199)
HDL: 43 mg/dL (ref >39)
LDL Chol Calc (NIH): 96 mg/dL (ref 0–99)
Triglycerides: 127 mg/dL (ref 0–149)
VLDL Cholesterol Cal: 23 mg/dL (ref 5–40)

## 2022-04-01 LAB — CARDIOVASCULAR RISK ASSESSMENT

## 2022-04-01 NOTE — Telephone Encounter (Signed)
Papers received from patient with financial information requested by [facility] for Ofev.  Papers given to pharmacy team.

## 2022-04-01 NOTE — Progress Notes (Signed)
Blood count abnormal. Wbc worsened. Seeing specialists.  Liver function normal.  Kidney function abnormal, but improved.  Cholesterol: good.  HBA1C: improved. From 7.4 down to 6.6.  Helicobactor test is pending.

## 2022-04-01 NOTE — Telephone Encounter (Signed)
Submitted Patient Assistance Application to [employer] for OFEV along with provider portion, PA and income documents. Will update patient when we receive a response.  Fax# [phone] Phone# [phone] 

## 2022-04-01 NOTE — Telephone Encounter (Signed)
Encounter created in error, please disregard.  

## 2022-04-02 LAB — HELICOBACTER PYLORI  ANTIBODY, IGM: H pylori, IgM Abs: <9 units (ref 0.0–8.9)

## 2022-04-02 NOTE — Telephone Encounter (Signed)
Received a fax from  [facility] regarding an approval for [place] patient assistance from 04/01/2022 to 08/24/2022. Approval letter sent to scan center.  Reached out and spoke with pt who confirms that shipment has already been scheduled and medication is arriving tomorrow (8/10) via [place]. Verified that pt understood that medication is bid dosing and should be taken with food. Stated that [facility] will most likely reach out to him towards the end of next week to see how he is doing on the medication, and requested that he reach out to [place] here at the clinic if he should have any additional questions in the meantime. Pt verbalized understanding to all.

## 2022-04-08 ENCOUNTER — Ambulatory Visit (INDEPENDENT_AMBULATORY_CARE_PROVIDER_SITE_OTHER): Payer: Medicare HMO

## 2022-04-08 DIAGNOSIS — Z6828 Body mass index (BMI) 28.0-28.9, adult: Secondary | ICD-10-CM

## 2022-04-08 DIAGNOSIS — Z0000 Encounter for general adult medical examination without abnormal findings: Secondary | ICD-10-CM | POA: Diagnosis not present

## 2022-04-08 NOTE — Progress Notes (Addendum)
Subjective:   Erin Uecker is a 86 y.o. male who presents for Medicare Annual/Subsequent preventive examination.  I connected with  [name] on 04/08/22 by a audio enabled telemedicine application and verified that I am speaking with the correct person using two identifiers.  Patient Location: Home  Provider Location: Office/Clinic  I discussed the limitations of evaluation and management by telemedicine. The patient expressed understanding and agreed to proceed.   Also on the call was Mr Vermeer wife, [name].  Cardiac Risk Factors include: advanced age (>51mn, >>13women);diabetes mellitus;male gender     Objective:    There were no vitals filed for this visit. There is no height or weight on file to calculate BMI.     08/13/2021    6:00 PM 08/13/2021    7:25 AM 12/17/2020    5:45 AM 12/13/2020   11:34 AM 11/08/2020    2:33 PM 08/09/2020    1:42 PM 04/20/2019    1:00 AM  Advanced Directives  Does Patient Have a Medical Advance Directive? No No No No No No No  Would patient like information on creating a medical advance directive? No - Patient declined No - Patient declined No - Patient declined Yes (MAU/Ambulatory/Procedural Areas - Information given) No - Patient declined  No - Patient declined    Current Medications (verified) Outpatient Encounter Medications as of 04/08/2022  Medication Sig   Alcohol Swabs (B-D SINGLE USE SWABS REGULAR) PADS 1 each by Does not apply route in the morning, at noon, in the evening, and at bedtime.   aspirin EC 81 MG tablet Take 81 mg by mouth daily. Swallow whole.   Coenzyme Q10 100 MG TABS Take 100 mg by mouth at bedtime.   furosemide (LASIX) 20 MG tablet Take 1 tablet (20 mg total) by mouth daily as needed.   gabapentin (NEURONTIN) 400 MG capsule TAKE 1 CAPSULE THREE TIMES DAILY   glucose blood (TRUE METRIX BLOOD GLUCOSE TEST) test strip TEST BLOOD SUGAR THREE TIMES DAILY BEFORE MEALS   insulin aspart (NOVOLOG FLEXPEN) 100  UNIT/ML FlexPen Up to 16 U per day Check sugar prior to each meal and give dose below. 100 - 150: 4 U. 151 - 200: + 2 U (6 U) 201 - 250: + 4 U (8 U) 251 - 300: + 6 U (10 U) 301 - 350: + 8 U (12 U) 350 - 400: + 10 U (14 U) > 400: + 12 U (16U)   Insulin Glargine (TOUJEO MAX SOLOSTAR [place]) Inject 16 Units into the skin 2 (two) times daily.   Insulin Pen Needle (DROPLET PEN NEEDLES) 31G X 8 MM MISC Gives max of 5 insulin injections daily.   Multiple Vitamins-Minerals (PRESERVISION AREDS 2) CAPS Take 1 capsule by mouth 2 (two) times daily after a meal.   nitroGLYCERIN (NITROSTAT) 0.4 MG SL tablet Place 1 tablet (0.4 mg total) under the tongue every 5 (five) minutes as needed for chest pain.   omeprazole (PRILOSEC) 20 MG capsule Take 1 capsule (20 mg total) by mouth daily.   OXYGEN Inhale 1 L into the lungs daily. Using as needed during the day and continuous at night.   rosuvastatin (CRESTOR) 20 MG tablet Take 1 tablet (20 mg total) by mouth daily.   vitamin B-12 (CYANOCOBALAMIN) 1000 MCG tablet Take 1 tablet (1,000 mcg total) by mouth daily.   No facility-administered encounter medications on file as of 04/08/2022.    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  ANCA antibodies- myeloperoxidase ABS-greater than 100, ANCA proteinase 3-less than 3.5 04/02/2019- ANCA titers- p-ANCA +1: 160, C ANCA-less than 1: 20, atypical p-ANCA titer-less than 1: 20   Abnormal CT of the chest    Abnormal findings on diagnostic imaging of lung 04/12/2019   04/01/2019-CT chest with contrast- no acute process in chest abdomen or pelvis, interstitial lung disease suspicious for early or mild UIP, pulmonary artery enlargement suggest PAH    Acute pain of left knee 11/30/2019   Alpha-1-antitrypsin deficiency carrier  05/04/2019   Atherosclerotic heart disease of native coronary artery without angina pectoris 05/03/2018   Body mass index (BMI) 29.0-29.9, adult 01/05/2020   Bradycardia    Chronic kidney disease, stage 3a ([place]) 08/12/2021   CKD (chronic kidney disease) 05/03/2018   Coronary artery disease    Coronary artery disease involving native coronary artery of native heart without angina pectoris 05/03/2018   Diabetes mellitus type 2 in obese ([place]) 04/01/2019   Diabetic polyneuropathy associated with type 2 diabetes mellitus ([place]) 08/12/2021   Dyslipidemia associated with type 2 diabetes mellitus ([place]) 05/03/2018   Elevated rheumatoid factor 04/12/2019   04/03/2019-rheumatoid factor-95.4   Essential hypertension 05/03/2018   GAD (generalized anxiety disorder)    GERD without esophagitis 05/03/2018   Hematuria 09/07/2020   History of kidney stones    Hypertensive heart disease without heart failure 05/03/2018   Hyponatremia 08/12/2021   Idiopathic pulmonary fibrosis ([place]) 03/29/2021   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[place]) 03/29/2021   Insomnia 05/03/2018   Interstitial pulmonary disease ([place]) 04/12/2019   04/01/2019-CT chest with contrast- no acute process in chest abdomen or pelvis, interstitial lung disease suspicious for early or mild UIP, pulmonary artery enlargement suggest PAH  04/02/2019-connective tissue work-up: Anti-Jo 1-negative Anti-DNA antibody double-stranded-negative Anti-scleroderma antibody-negative Sjogren's syndrome antibody-negative Sjogren's syndrome antibody-negative CK-31 CCP-6 E   Leukocytosis 03/08/2020   Low vitamin B12 level 04/03/2019   Lower extremity edema    Medicare annual wellness visit, subsequent 08/09/2020   Microscopic polyangiitis ([place]) 03/29/2021   Mixed diabetic hyperlipidemia associated with type 2 diabetes mellitus ([place]) 08/12/2021   Mixed hyperlipidemia 05/03/2018   Myalgia 04/19/2019   Osteoarthritis    Other emphysema ([place])    Other long term (current) drug  therapy 03/29/2021   Peripheral polyneuropathy 01/30/2020   Polymyositis ([place])    Primary insomnia    Proteinuria 09/07/2020   Renal insufficiency 09/06/2020   Respiratory disorder concurrent with and due to microscopic polyangiitis ([place]) 08/12/2021   Rheumatoid factor positive 03/29/2021   Sepsis ([place]) 04/19/2019   Skin cancer    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/17/2020   Transaminitis    Trigger finger 05/03/2018   Type 2 diabetes mellitus with hyperglycemia, with long-term current use of insulin ([place]) 04/23/2021   UC (ulcerative colitis) ([place])    UTI (urinary tract infection)    Vasculitis ([place]) 03/29/2021   Past Surgical History:  Procedure Laterality Date   ANGIOPLASTY  2010   BACK SURGERY     CATARACT EXTRACTION     COLONOSCOPY  06/16/2005   Mild colitis involving splenic flexure. Colonic polyps, status post polypectomy. Mild pancolonic diverticulitits. Internal hemorrhoids.    ESOPHAGOGASTRODUODENOSCOPY  04/26/2003   Irregular Z line suggestive of GERD. Mild gastritis status post CLO testing.    TOTAL KNEE ARTHROPLASTY Left 12/17/2020   Procedure: LEFT TOTAL KNEE ARTHROPLASTY;  Surgeon: Leandrew Koyanagi,  MD;  Location: [facility];  Service: Orthopedics;  Laterality: Left;   TRIGGER FINGER RELEASE     Family History  Problem Relation Age of Onset   Tuberculosis Mother    Stroke Father    Pancreatic cancer Sister    Heart attack Sister    Lung disease Sister    Clotting disorder Brother    Colon cancer Neg Hx    Esophageal cancer Neg Hx    Social History   Socioeconomic History   Marital status: Married    Spouse name: [name]   Number of children: 2  Occupational History   Occupation: retired  Tobacco Use   Smoking status: Never   Smokeless tobacco: Never  Vaping Use   Vaping Use: Never used  Substance and Sexual Activity   Alcohol use: Never   Drug use: Never   Sexual activity: Not on file   Social Determinants of Health   Financial Resource Strain:  Low Risk  (04/08/2022)   Overall Financial Resource Strain (CARDIA)    Difficulty of Paying Living Expenses: Not hard at all  Food Insecurity: No Food Insecurity (04/08/2022)   Hunger Vital Sign    Worried About Running Out of Food in the Last Year: Never true    Ran Out of Food in the Last Year: Never true  Transportation Needs: No Transportation Needs (04/08/2022)   PRAPARE - Hydrologist (Medical): No    Lack of Transportation (Non-Medical): No  Physical Activity: Sufficiently Active (06/07/2020)   Exercise Vital Sign    Days of Exercise per Week: 5 days    Minutes of Exercise per Session: 30 min  Stress: Not on file  Social Connections: Not on file    Tobacco Counseling Counseling given: No tobacco products used   Clinical Intake:  Pre-visit preparation completed: Yes Pain : No/denies pain   BMI - recorded: 28.41 Nutritional Status: BMI 25 -29 Overweight Diabetes: Yes (A1C 6.6) Did pt. bring in CBG monitor from home?:  (patient reports home readings between 100-127) How often do you need to have someone help you when you read instructions, pamphlets, or other written materials from your doctor or pharmacy?: 2 - Rarely Interpreter Needed?: No    Activities of Daily Living    04/08/2022    9:21 AM 08/13/2021    6:00 PM  In your present state of health, do you have any difficulty performing the following activities:  Hearing? 0 0  Vision? 0 0  Difficulty concentrating or making decisions? 0 0  Walking or climbing stairs? 1 1  Dressing or bathing? 0 0  Doing errands, shopping? 1 0  Preparing Food and eating ? N   Using the Toilet? N   In the past six months, have you accidently leaked urine? Y   Do you have problems with loss of bowel control? N   Managing your Medications? Y   Managing your Finances? N   Housekeeping or managing your Housekeeping? N     Patient Care Team: [name], MD as PCP - General (Family Medicine) [name], MD as Consulting Physician (Pulmonary Disease) Gavin Pound, MD as Consulting Physician (Rheumatology) Revankar, Reita Cliche, MD as Consulting Physician (Cardiology) Lane Hacker, [facility] (Pharmacist)     Assessment:   This is a routine wellness examination for Kerry Matthews.  Hearing/Vision screen No results found.  Dietary issues and exercise activities discussed: Current Exercise Habits: Home exercise routine, Type of exercise: stretching;walking, Time (Minutes): 20, Frequency (  Times/Week): 4, Weekly Exercise (Minutes/Week): 80, Intensity: Mild  Depression Screen    04/08/2022    9:34 AM 03/31/2022    7:43 AM 12/26/2021    8:02 AM 12/06/2021   10:40 AM 10/07/2021   10:09 AM 08/06/2021    9:01 AM 05/09/2021   11:25 AM  PHQ 2/9 Scores  PHQ - 2 Score 0 0 0 0 0 0 0    Fall Risk    04/08/2022    9:35 AM 03/31/2022    7:43 AM 12/26/2021    8:01 AM 12/06/2021   10:40 AM 10/07/2021   10:09 AM  Fall Risk   Falls in the past year? 1  0 0 0  Number falls in past yr: 1 1 0 0 0  Injury with Fall? 1 1 0 0 0  Risk for fall due to : History of fall(s);Impaired balance/gait History of fall(s)     Follow up Education provided;Falls prevention discussed;Falls evaluation completed Falls evaluation completed Falls evaluation completed  Falls evaluation completed  Comment currently doing PT and OT        FALL RISK PREVENTION PERTAINING TO THE HOME:  Any stairs in or around the home? Yes  If so, are there any without handrails? No  Home free of loose throw rugs in walkways, pet beds, electrical cords, etc? Yes  Adequate lighting in your home to reduce risk of falls? Yes   ASSISTIVE DEVICES UTILIZED TO PREVENT FALLS:  Use of a cane, walker or w/c? Yes  Grab bars in the bathroom? No  Shower chair or bench in shower? Yes  Elevated toilet seat or a handicapped toilet? No    Cognitive Function:    08/09/2020    1:53 PM  MMSE - Mini Mental State Exam  Orientation to time 5  Orientation to  Place 5  Registration 3  Attention/ Calculation 3  Recall 3  Language- name 2 objects 2  Language- repeat 0  Language- follow 3 step command 3  Language- read & follow direction 1  Write a sentence 0  Copy design 1  Total score 26        04/08/2022    9:38 AM 08/09/2020    1:48 PM  6CIT Screen  What Year? 0 points 0 points  What month? 0 points 0 points  What time? 0 points 0 points  Count back from 20 4 points 4 points  Months in reverse 2 points 2 points  Repeat phrase 6 points 6 points  Total Score 12 points 12 points    Immunizations Immunization History  Administered Date(s) Administered   PFIZER(Purple Top)SARS-COV-2 Vaccination 10/17/2019, 11/07/2019, 06/05/2020    TDAP status: Due, Education has been provided regarding the importance of this vaccine. Advised may receive this vaccine at local pharmacy or Health Dept. Aware to provide a copy of the vaccination record if obtained from local pharmacy or Health Dept. Verbalized acceptance and understanding.  Flu Vaccine status: Declined, Education has been provided regarding the importance of this vaccine but patient still declined. Advised may receive this vaccine at local pharmacy or Health Dept. Aware to provide a copy of the vaccination record if obtained from local pharmacy or Health Dept. Verbalized acceptance and understanding.  Pneumococcal vaccine status: Declined,  Education has been provided regarding the importance of this vaccine but patient still declined. Advised may receive this vaccine at local pharmacy or Health Dept. Aware to provide a copy of the vaccination record if obtained from local pharmacy or Health  Dept. Verbalized acceptance and understanding.   Covid-19 vaccine status: Declined, Education has been provided regarding the importance of this vaccine but patient still declined. Advised may receive this vaccine at local pharmacy or Health Dept.or vaccine clinic. Aware to provide a copy of the  vaccination record if obtained from local pharmacy or Health Dept. Verbalized acceptance and understanding.  Qualifies for Shingles Vaccine? Yes   Zostavax completed No   Shingrix Completed?: No.    Education has been provided regarding the importance of this vaccine. Patient has been advised to call insurance company to determine out of pocket expense if they have not yet received this vaccine. Advised may also receive vaccine at local pharmacy or Health Dept. Verbalized acceptance and understanding.  Screening Tests Health Maintenance  Topic Date Due   TETANUS/TDAP  Never done   Zoster Vaccines- Shingrix (1 of 2) Never done   COVID-19 Vaccine (4 - Pfizer risk series) 07/31/2020   INFLUENZA VACCINE  03/25/2022   OPHTHALMOLOGY EXAM  04/02/2022   Pneumonia Vaccine 57+ Years old (1 - PCV) 08/06/2022 (Originally 10/15/1941)   HEMOGLOBIN A1C  10/01/2022   URINE MICROALBUMIN  12/27/2022   FOOT EXAM  04/01/2023   HPV VACCINES  Aged Out    Health Maintenance  Health Maintenance Due  Topic Date Due   TETANUS/TDAP  Never done   Zoster Vaccines- Shingrix (1 of 2) Never done   COVID-19 Vaccine (4 - Pfizer risk series) 07/31/2020   INFLUENZA VACCINE  03/25/2022   OPHTHALMOLOGY EXAM  04/02/2022    Colorectal cancer screening: No longer required.   Lung Cancer Screening: (Low Dose CT Chest recommended if Age 74-80 years, 30 pack-year currently smoking OR have quit w/in 15years.) does not qualify.   Additional Screening:  Vision Screening: Recommended annual ophthalmology exams for early detection of glaucoma and other disorders of the eye. Is the patient up to date with their annual eye exam?  No  Who is the provider or what is the name of the office in which the patient attends annual eye exams? Dr Renaldo Fiddler - [facility]  Dental Screening: Recommended annual dental exams for proper oral hygiene     Plan:    1- Vaccines due - Flu, Tetanus, Pneumonia, Shingles, COVID - patient  declined at this time  2- Schedule diabetic eye exam  3- Discussed bone density scan - patient agreed to take calcium + D vitamin daily for bone health  4- Discussed advanced directive - mailed information  I have personally reviewed and noted the following in the patient's chart:   Medical and social history Use of alcohol, tobacco or illicit drugs  Current medications and supplements including opioid prescriptions. Patient is not currently taking opioid prescriptions. Functional ability and status Nutritional status Physical activity Advanced directives List of other physicians Hospitalizations, surgeries, and ER visits in previous 12 months Historical vitals Screenings to include cognitive, depression, and falls Referrals and appointments  In addition, I have reviewed and discussed with patient certain preventive protocols, quality metrics, and best practice recommendations. A written personalized care plan for preventive services as well as general preventive health recommendations were provided to patient.    Erie Noe, LPN   [id number]/[id number]

## 2022-04-08 NOTE — Patient Instructions (Signed)
[name] , Thank you for taking time to come for your Medicare Wellness Visit. I appreciate your ongoing commitment to your health goals. Please review the following plan we discussed and let me know if I can assist you in the future.   Screening recommendations/referrals: Recommended yearly ophthalmology/optometry visit for glaucoma screening and checkup Recommended yearly dental visit for hygiene and checkup  Vaccinations: Influenza vaccine: Due in the fall Pneumococcal vaccine: Due Tdap vaccine: Due Shingles vaccine: Due    Advanced directives: information mailed   Preventive Care 49 Years and Older, Male Preventive care refers to lifestyle choices and visits with your health care provider that can promote health and wellness. What does preventive care include? A yearly physical exam. This is also called an annual well check. Dental exams once or twice a year. Routine eye exams. Ask your health care provider how often you should have your eyes checked. Personal lifestyle choices, including: Daily care of your teeth and gums. Regular physical activity. Eating a healthy diet. Avoiding tobacco and drug use. Limiting alcohol use. Practicing safe sex. Taking low doses of aspirin every day. Taking vitamin and mineral supplements as recommended by your health care provider. What happens during an annual well check? The services and screenings done by your health care provider during your annual well check will depend on your age, overall health, lifestyle risk factors, and family history of disease. Counseling  Your health care provider may ask you questions about your: Alcohol use. Tobacco use. Drug use. Emotional well-being. Home and relationship well-being. Sexual activity. Eating habits. History of falls. Memory and ability to understand (cognition). Work and work Statistician. Screening  You may have the following tests or measurements: Height, weight, and  BMI. Blood pressure. Lipid and cholesterol levels. These may be checked every 5 years, or more frequently if you are over 11 years old. Skin check. Lung cancer screening. You may have this screening every year starting at age 67 if you have a 30-pack-year history of smoking and currently smoke or have quit within the past 15 years. Fecal occult blood test (FOBT) of the stool. You may have this test every year starting at age 40. Flexible sigmoidoscopy or colonoscopy. You may have a sigmoidoscopy every 5 years or a colonoscopy every 10 years starting at age 30. Prostate cancer screening. Recommendations will vary depending on your family history and other risks. Hepatitis C blood test. Hepatitis B blood test. Sexually transmitted disease (STD) testing. Diabetes screening. This is done by checking your blood sugar (glucose) after you have not eaten for a while (fasting). You may have this done every 1-3 years. Abdominal aortic aneurysm (AAA) screening. You may need this if you are a current or former smoker. Osteoporosis. You may be screened starting at age 90 if you are at high risk. Talk with your health care provider about your test results, treatment options, and if necessary, the need for more tests. Vaccines  Your health care provider may recommend certain vaccines, such as: Influenza vaccine. This is recommended every year. Tetanus, diphtheria, and acellular pertussis (Tdap, Td) vaccine. You may need a Td booster every 10 years. Zoster vaccine. You may need this after age 48. Pneumococcal 13-valent conjugate (PCV13) vaccine. One dose is recommended after age 84. Pneumococcal polysaccharide (PPSV23) vaccine. One dose is recommended after age 90. Talk to your health care provider about which screenings and vaccines you need and how often you need them. This information is not intended to replace advice given to  you by your health care provider. Make sure you discuss any questions you have  with your health care provider. Document Released: 09/07/2015 Document Revised: 04/30/2016 Document Reviewed: 06/12/2015 Elsevier Interactive Patient Education  2017 [place] Prevention in the Home Falls can cause injuries. They can happen to people of all ages. There are many things you can do to make your home safe and to help prevent falls. What can I do on the outside of my home? Regularly fix the edges of walkways and driveways and fix any cracks. Remove anything that might make you trip as you walk through a door, such as a raised step or threshold. Trim any bushes or trees on the path to your home. Use bright outdoor lighting. Clear any walking paths of anything that might make someone trip, such as rocks or tools. Regularly check to see if handrails are loose or broken. Make sure that both sides of any steps have handrails. Any raised decks and porches should have guardrails on the edges. Have any leaves, snow, or ice cleared regularly. Use sand or salt on walking paths during winter. Clean up any spills in your garage right away. This includes oil or grease spills. What can I do in the bathroom? Use night lights. Install grab bars by the toilet and in the tub and shower. Do not use towel bars as grab bars. Use non-skid mats or decals in the tub or shower. If you need to sit down in the shower, use a plastic, non-slip stool. Keep the floor dry. Clean up any water that spills on the floor as soon as it happens. Remove soap buildup in the tub or shower regularly. Attach bath mats securely with double-sided non-slip rug tape. Do not have throw rugs and other things on the floor that can make you trip. What can I do in the bedroom? Use night lights. Make sure that you have a light by your bed that is easy to reach. Do not use any sheets or blankets that are too big for your bed. They should not hang down onto the floor. Have a firm chair that has side arms. You can use  this for support while you get dressed. Do not have throw rugs and other things on the floor that can make you trip. What can I do in the kitchen? Clean up any spills right away. Avoid walking on wet floors. Keep items that you use a lot in easy-to-reach places. If you need to reach something above you, use a strong step stool that has a grab bar. Keep electrical cords out of the way. Do not use floor polish or wax that makes floors slippery. If you must use wax, use non-skid floor wax. Do not have throw rugs and other things on the floor that can make you trip. What can I do with my stairs? Do not leave any items on the stairs. Make sure that there are handrails on both sides of the stairs and use them. Fix handrails that are broken or loose. Make sure that handrails are as long as the stairways. Check any carpeting to make sure that it is firmly attached to the stairs. Fix any carpet that is loose or worn. Avoid having throw rugs at the top or bottom of the stairs. If you do have throw rugs, attach them to the floor with carpet tape. Make sure that you have a light switch at the top of the stairs and the bottom of the stairs.  If you do not have them, ask someone to add them for you. What else can I do to help prevent falls? Wear shoes that: Do not have high heels. Have rubber bottoms. Are comfortable and fit you well. Are closed at the toe. Do not wear sandals. If you use a stepladder: Make sure that it is fully opened. Do not climb a closed stepladder. Make sure that both sides of the stepladder are locked into place. Ask someone to hold it for you, if possible. Clearly mark and make sure that you can see: Any grab bars or handrails. First and last steps. Where the edge of each step is. Use tools that help you move around (mobility aids) if they are needed. These include: Canes. Walkers. Scooters. Crutches. Turn on the lights when you go into a dark area. Replace any light bulbs  as soon as they burn out. Set up your furniture so you have a clear path. Avoid moving your furniture around. If any of your floors are uneven, fix them. If there are any pets around you, be aware of where they are. Review your medicines with your doctor. Some medicines can make you feel dizzy. This can increase your chance of falling. Ask your doctor what other things that you can do to help prevent falls. This information is not intended to replace advice given to you by your health care provider. Make sure you discuss any questions you have with your health care provider. Document Released: 06/07/2009 Document Revised: 01/17/2016 Document Reviewed: 09/15/2014 Elsevier Interactive Patient Education  2017 [employer].

## 2022-04-10 ENCOUNTER — Other Ambulatory Visit: Payer: Self-pay | Admitting: Family Medicine

## 2022-04-16 ENCOUNTER — Other Ambulatory Visit: Payer: Self-pay | Admitting: Family Medicine

## 2022-04-16 ENCOUNTER — Telehealth: Payer: Self-pay | Admitting: Pharmacist

## 2022-04-16 DIAGNOSIS — Z5181 Encounter for therapeutic drug level monitoring: Secondary | ICD-10-CM

## 2022-04-16 DIAGNOSIS — J849 Interstitial pulmonary disease, unspecified: Secondary | ICD-10-CM

## 2022-04-16 MED ORDER — OFEV 150 MG PO CAPS: 150.0000 mg | ORAL_CAPSULE | Freq: Two times a day (BID) | ORAL | 0 refills | Status: DC

## 2022-04-16 NOTE — Telephone Encounter (Signed)
Subjective:  Patient called today by [facility] pharmacy team for Ofev new start counseling.   Patient was last seen by Dr. Vaughan Browner on 02/19/22. 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  He is naive to antifibrotics  Patient started Ofev on 04/04/22. He reports some loose bowel movements when he initially started but reports tightness more recently. He states it is not constipation but difficult to have bowel movements more recently.  Patient's wife reports that there is some decrease in appetite but not significant.  She also reports that patient has forgotten to take medication in evening (since it is the only medication he takes in evening).  History of CAD: Yes History of MI: No. Family history (sister) Current anticoagulant use: No History of HTN: No  History of elevated LFTs: LFTs elevated on 03/14/22. Most recent on 03/31/22 wnl History of diarrhea, nausea, vomiting: No  Objective: Allergies  Allergen Reactions   Ace Inhibitors Other (See Comments)    = Slow heart rate   Hydrocodone Nausea And Vomiting   Hydrocodone-Acetaminophen Nausea And Vomiting   Nsaids Other (See Comments)    Kidney issues   Sulfamethoxazole Nausea And Vomiting   Sulfamethoxazole-Trimethoprim Nausea And Vomiting   Trimethoprim Other (See Comments)    Reaction not recalled    Outpatient Encounter Medications as of 04/16/2022  Medication Sig   Alcohol Swabs (B-D SINGLE USE SWABS REGULAR) PADS 1 each by Does not apply route in the morning, at noon, in the evening, and at bedtime.   aspirin EC 81 MG tablet Take 81 mg by mouth daily. Swallow whole.   Coenzyme Q10 100 MG TABS Take 100 mg by mouth at bedtime.   furosemide (LASIX) 20 MG tablet TAKE 1 TABLET EVERY DAY AS NEEDED   gabapentin (NEURONTIN) 400 MG capsule TAKE 1 CAPSULE THREE TIMES DAILY   glucose blood  (TRUE METRIX BLOOD GLUCOSE TEST) test strip TEST BLOOD SUGAR THREE TIMES DAILY BEFORE MEALS   insulin aspart (NOVOLOG FLEXPEN) 100 UNIT/ML FlexPen Up to 16 U per day Check sugar prior to each meal and give dose below. 100 - 150: 4 U. 151 - 200: + 2 U (6 U) 201 - 250: + 4 U (8 U) 251 - 300: + 6 U (10 U) 301 - 350: + 8 U (12 U) 350 - 400: + 10 U (14 U) > 400: + 12 U (16U)   Insulin Glargine (TOUJEO MAX SOLOSTAR [place]) Inject 16 Units into the skin 2 (two) times daily.   Insulin Pen Needle (DROPLET PEN NEEDLES) 31G X 8 MM MISC Gives max of 5 insulin injections daily.   Multiple Vitamins-Minerals (PRESERVISION AREDS 2) CAPS Take 1 capsule by mouth 2 (two) times daily after a meal.   nitroGLYCERIN (NITROSTAT) 0.4 MG SL tablet Place 1 tablet (0.4 mg total) under the tongue every 5 (five) minutes as needed for chest pain.   omeprazole (PRILOSEC) 20 MG capsule Take 1 capsule (20 mg total) by mouth daily.   OXYGEN Inhale 1 L into the lungs daily. Using as needed during the day and continuous at night.   rosuvastatin (CRESTOR) 20 MG tablet TAKE 1 TABLET EVERY DAY   vitamin B-12 (CYANOCOBALAMIN) 1000 MCG tablet Take 1 tablet (1,000 mcg total) by mouth daily.   No facility-administered encounter medications on file as of 04/16/2022.     Immunization History  Administered Date(s) Administered  PFIZER(Purple Top)SARS-COV-2 Vaccination 10/17/2019, 11/07/2019, 06/05/2020   \ PFT's TLC  Date Value Ref Range Status  05/03/2019 4.85 L Final    CMP     Component Value Date/Time   NA 138 03/31/2022 0844   K 5.2 03/31/2022 0844   CL 98 03/31/2022 0844   CO2 22 03/31/2022 0844   GLUCOSE 104 (H) 03/31/2022 0844   GLUCOSE 322 (H) 02/19/2022 1403   BUN 16 03/31/2022 0844   CREATININE 1.29 (H) 03/31/2022 0844   CALCIUM 9.9 03/31/2022 0844   PROT 6.5 03/31/2022 0844   ALBUMIN 4.0 03/31/2022 0844   AST 18 03/31/2022 0844   ALT 17 03/31/2022 0844   ALKPHOS 156 (H) 03/31/2022 0844   BILITOT 0.7 03/31/2022 0844    GFRNONAA >60 08/23/2021 0127   GFRAA 55 (L) 07/26/2020 0953      CBC    Component Value Date/Time   WBC 14.5 (H) 03/31/2022 0844   WBC 12.4 (H) 02/19/2022 1403   RBC 5.37 03/31/2022 0844   RBC 5.36 02/19/2022 1403   HGB 15.4 03/31/2022 0844   HCT 47.3 03/31/2022 0844   PLT 317 03/31/2022 0844   MCV 88 03/31/2022 0844   MCH 28.7 03/31/2022 0844   MCH 31.2 08/22/2021 0059   MCHC 32.6 03/31/2022 0844   MCHC 32.8 02/19/2022 1403   RDW 13.3 03/31/2022 0844   LYMPHSABS 4.1 (H) 03/31/2022 0844   MONOABS 0.4 02/19/2022 1403   EOSABS 0.9 (H) 03/31/2022 0844   BASOSABS 0.2 03/31/2022 0844     LFT's    Latest Ref Rng & Units 03/31/2022    8:44 AM 03/14/2022   12:19 PM 02/19/2022    2:03 PM  Hepatic Function  Total Protein 6.0 - 8.5 g/dL 6.5  5.5  6.9   Albumin 3.7 - 4.7 g/dL 4.0  2.8  3.9   AST 0 - 40 IU/L 18  109  20   ALT 0 - 44 IU/L 17  94  17   Alk Phosphatase 44 - 121 IU/L 156  247  106   Total Bilirubin 0.0 - 1.2 mg/dL 0.7  0.3  0.7      HRCT (01/29/22) - Pulmonary parenchymal pattern of fibrosis, as detailed above, is likely due to 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 Findings are consistent with UIP per consensus guidelines  Assessment and Plan  Ofev Medication Management Thoroughly counseled patient on the efficacy, mechanism of action, dosing, administration, adverse effects, and monitoring parameters of Ofev. Patient verbalized understanding.  Goals of Therapy: Will not stop or reverse the progression of ILD. It will slow the progression of ILD.  Inhibits tyrosine kinase inhibitors which slow the fibrosis/progression of ILD -Significant reduction in the rate of disease progression was observed after treatment (61.1% [before] vs 33.3% [after], P?=?0.008) over 42 weeks.  Dosing: 150 mg (one capsule) by mouth twice daily (approx 12 hours apart). Discussed taking with food approximately 12 hours apart.  Discussed that capsule should not be crushed or split.  Adverse Effects: Nausea, vomiting, diarrhea (2 in 3 patients) appetite loss, weight loss - management of diarrhea with loperamide discussed including max use of 48 hours and max of 8 capsules per day. Abdominal pain (up to 1 in 5 patients) Nasopharyngitis (13%), UTI (6%) Risk of thrombosis (3%) and acute MI (2%) Hypertension (5%) Dizziness Fatigue (10%) Patient reports that he has had increased fatigue since starting Ofev. Advised him to hold Ofev for one week and monitor changes in energy  and fatigue. If improved, can retry medication. Reviewed that he may take docusate for constipation, and importance of maintaining adequate hydration and fiber intake.  Monitoring: Monitor for diarrhea, nausea and vomiting, GI perforation, hepatotoxicity  Monitor LFTs - baseline, monthly for first 3 months, then every 3 months routinely CBC w differential at baseline and every 3 months routinely Staff message sent to Dr. Tobie Poet, PCP to see if he can have labwork completed at PCP office. IF not, can have labs drawn at [facility] in [place]: Approval of Ofev through: patient assistance Rx sent to: [facility] for Ofev: [phone] ([place])  Medication Reconciliation A drug regimen assessment was performed, including review of allergies, interactions, disease-state management, dosing and immunization history. Medications were reviewed with the patient, including name, instructions, indication, goals of therapy, potential side effects, importance of adherence, and safe use.  Anticoagulant use: No  No drug interactions with current medication list  Immunizations Patient is indicated for the influenzae, pneumonia, and shingles vaccinations. Patient has received 3 COVID19 vaccines.  Thank you for involving pharmacy to assist in providing this patient's care.    Knox Saliva, PharmD, MPH, BCPS, CPP Clinical Pharmacist (Rheumatology  and Pulmonology)

## 2022-04-16 NOTE — Telephone Encounter (Signed)
Ofev counseling provided. Documented in separate telephone encounter  Knox Saliva, PharmD, MPH, BCPS, CPP Clinical Pharmacist (Rheumatology and Pulmonology)

## 2022-04-24 DIAGNOSIS — J988 Other specified respiratory disorders: Secondary | ICD-10-CM | POA: Diagnosis not present

## 2022-04-24 DIAGNOSIS — M317 Microscopic polyangiitis: Secondary | ICD-10-CM | POA: Diagnosis not present

## 2022-04-24 DIAGNOSIS — J189 Pneumonia, unspecified organism: Secondary | ICD-10-CM | POA: Diagnosis not present

## 2022-04-30 ENCOUNTER — Other Ambulatory Visit: Payer: Medicare HMO

## 2022-04-30 DIAGNOSIS — Z5181 Encounter for therapeutic drug level monitoring: Secondary | ICD-10-CM

## 2022-04-30 DIAGNOSIS — J849 Interstitial pulmonary disease, unspecified: Secondary | ICD-10-CM

## 2022-04-30 NOTE — Addendum Note (Signed)
Addended by: Thompson Caul I on: 04/30/2022 04:09 PM   Modules accepted: Orders

## 2022-05-01 LAB — COMPREHENSIVE METABOLIC PANEL
ALT: 8 IU/L (ref 0–44)
AST: 22 IU/L (ref 0–40)
Albumin/Globulin Ratio: 1.7 (ref 1.2–2.2)
Albumin: 3.9 g/dL (ref 3.7–4.7)
Alkaline Phosphatase: 149 IU/L — ABNORMAL HIGH (ref 44–121)
BUN/Creatinine Ratio: 9 — ABNORMAL LOW (ref 10–24)
BUN: 10 mg/dL (ref 8–27)
Bilirubin Total: 0.8 mg/dL (ref 0.0–1.2)
CO2: 21 mmol/L (ref 20–29)
Calcium: 9.5 mg/dL (ref 8.6–10.2)
Chloride: 99 mmol/L (ref 96–106)
Creatinine, Ser: 1.09 mg/dL (ref 0.76–1.27)
Globulin, Total: 2.3 g/dL (ref 1.5–4.5)
Glucose: 141 mg/dL — ABNORMAL HIGH (ref 70–99)
Potassium: 4.5 mmol/L (ref 3.5–5.2)
Sodium: 136 mmol/L (ref 134–144)
Total Protein: 6.2 g/dL (ref 6.0–8.5)
eGFR: 67 mL/min/{1.73_m2} (ref >59)

## 2022-05-05 ENCOUNTER — Telehealth: Payer: Medicare HMO

## 2022-05-07 ENCOUNTER — Ambulatory Visit (INDEPENDENT_AMBULATORY_CARE_PROVIDER_SITE_OTHER): Payer: Medicare HMO

## 2022-05-07 NOTE — Progress Notes (Signed)
Chronic Care Management Pharmacy Note  05/07/2022 Name:  Kerry Matthews MRN:  [id number] DOB:  15-Apr-1936  Subjective: Kerry Matthews is an 86 y.o. year old male who is a primary patient of Cox, Kirsten, MD.  The CCM team was consulted for assistance with disease management and care coordination needs.    Summary:  Patient and wife married in 1961 and have lived in same house since. Loves to work in the garden but hasn't since he was hospitalized in December for [place]. [place]. He didn't graduate HS, made it to the 7th grade and had to drop-out to work. Has 2 children and 4 grand daughters. Has a dog. 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    Plan Updates:  Patient is on Toujeo 16 units BID. Patient complained of insulin burden. Recommend changing to 30 units QD. If recommendation is acceptable, please let me know and I will call the patient   Engaged with patient by telephone for follow up visit in response to provider referral for pharmacy case management and/or care coordination services.   Consent to Services:  The patient was given information about Chronic Care Management services, agreed to services, and gave verbal consent prior to initiation of services.  Please see initial visit note for detailed documentation.   Patient Care Team: [name], MD as PCP - General (Family Medicine) [name], MD as Consulting Physician (Pulmonary Disease) Gavin Pound, MD as Consulting Physician (Rheumatology) Revankar, Reita Cliche, MD as Consulting Physician (Cardiology) Lane Hacker, [facility] (Pharmacist)  Recent office visits:  03/14/22 [name] MD. Seen for Acute Cystitis. No med changes.   02/05/22 [name] NP. Seen for muscle spasm. Started on Cyclobenzaprine HCI 45m.    Recent consult visits:  02/19/22 MMarshell GarfinkelMD. Seen for Interstitial Lung Disease. D/C Cyclobenzaprine 546m  We will start you on a medication called Ofev. Start tapering prednisone to 5 mg a day for 2 weeks and then stop altogether   Hospital visits:  Medication Reconciliation was completed by comparing discharge summary, patient's EMR and Pharmacy list, and upon discussion with patient.   Admitted to the hospital on 03/06/22 due to UTI. Discharge date was 03/11/22. [facility] -started Cephalexin 50089mLantus 100 units    Medications Discontinued at Hospital Discharge: -Stopped Cipro 500m15mnsulin Glargine 100units, Toujeo Solostar    -All other medications will remain the same.     Objective:  Lab Results  Component Value Date   CREATININE 1.09 04/30/2022   BUN 10 04/30/2022   GFR 40.27 (L) 02/19/2022   GFRNONAA >60 08/23/2021   GFRAA 55 (L) 07/26/2020   NA 136 04/30/2022   K 4.5 04/30/2022   CALCIUM 9.5 04/30/2022   CO2 21 04/30/2022   GLUCOSE 141 (H) 04/30/2022    Lab Results  Component Value Date/Time   HGBA1C 6.6 (H) 03/31/2022 08:44 AM   HGBA1C 7.4 (H) 12/26/2021 08:31 AM   GFR 40.27 (L) 02/19/2022 02:03 PM   GFR 53.09 (L) 09/20/2021 11:21 AM   MICROALBUR 10 04/11/2021 11:54 AM   MICROALBUR 10 03/06/2020 03:07 PM    Last diabetic Eye exam:  Lab Results  Component Value Date/Time   HMDIABEYEEXA No Retinopathy 04/02/2021 12:00 AM    Last diabetic Foot exam: No results found for: "HMDIABFOOTEX"   Lab Results  Component Value Date  CHOL 162 03/31/2022   HDL 43 03/31/2022   LDLCALC 96 03/31/2022   TRIG 127 03/31/2022   CHOLHDL 3.8 03/31/2022       Latest Ref Rng & Units 04/30/2022    8:38 AM 03/31/2022    8:44 AM 03/14/2022   12:19 PM  Hepatic Function  Total Protein 6.0 - 8.5 g/dL 6.2  6.5  5.5   Albumin 3.7 - 4.7 g/dL 3.9  4.0  2.8   AST 0 - 40 IU/L 22  18  109   ALT 0 - 44 IU/L 8  17  94   Alk Phosphatase 44 - 121 IU/L 149  156  247   Total Bilirubin 0.0 - 1.2 mg/dL 0.8  0.7  0.3      Lab Results  Component Value Date/Time   TSH 1.320 03/14/2022 12:19 PM   TSH 2.830 04/04/2021 08:33 AM       Latest Ref Rng & Units 03/31/2022    8:44 AM 03/14/2022   12:19 PM 02/19/2022    2:03 PM  CBC  WBC 3.4 - 10.8 x10E3/uL 14.5  11.3  12.4   Hemoglobin 13.0 - 17.7 g/dL 15.4  14.6  15.3   Hematocrit 37.5 - 51.0 % 47.3  45.0  46.6   Platelets 150 - 450 x10E3/uL 317  454  265.0     Lab Results  Component Value Date/Time   VD25OH 24.1 (L) 04/04/2021 08:33 AM    Clinical ASCVD: No  The ASCVD Risk score (Arnett DK, et al., 2019) failed to calculate for the following reasons:   The 2019 ASCVD risk score is only valid for ages 63 to 60       04/08/2022    9:34 AM 03/31/2022    7:43 AM 12/26/2021    8:02 AM  Depression screen PHQ 2/9  Decreased Interest 0 0 0  Down, Depressed, Hopeless 0 0 0  PHQ - 2 Score 0 0 0     Social History   Tobacco Use  Smoking Status Never  Smokeless Tobacco Never   BP Readings from Last 3 Encounters:  03/31/22 110/68  03/14/22 (!) 110/58  02/19/22 136/78   Pulse Readings from Last 3 Encounters:  03/31/22 84  03/14/22 (!) 52  02/19/22 91   Wt Readings from Last 3 Encounters:  03/31/22 176 lb (79.8 kg)  03/14/22 182 lb (82.6 kg)  02/19/22 182 lb 6.4 oz (82.7 kg)   BMI Readings from Last 3 Encounters:  03/31/22 28.41 kg/m  03/14/22 30.29 kg/m  02/19/22 30.35 kg/m    Assessment/Interventions: Review of patient past medical history, allergies, medications, health status, including review of consultants reports, laboratory and other test data, was performed as part of comprehensive evaluation and provision of chronic care management services.   SDOH:  (Social Determinants of Health) assessments and interventions performed: Yes SDOH Interventions    Flowsheet Row Chronic Care Management from 05/07/2022 in [place] from 04/08/2022 in [place] Management from 10/28/2021 in [place] Interventions     Food Insecurity Interventions -- Intervention Not Indicated --  Housing Interventions -- Intervention Not Indicated --  Transportation Interventions Intervention Not Indicated Intervention Not Indicated Intervention Not Indicated  Financial Strain Interventions Intervention Not Indicated Intervention Not Indicated Intervention Not Indicated      SDOH Screenings   Food Insecurity: No Food Insecurity (04/08/2022)  Housing: Low Risk  (04/08/2022)  Transportation Needs: No Transportation Needs (05/07/2022)  Alcohol Screen: Low Risk  (04/08/2022)  Depression (PHQ2-9): Low Risk  (04/08/2022)  Financial Resource Strain: Low Risk  (05/07/2022)  Physical Activity: Sufficiently Active (06/07/2020)  Tobacco Use: Low Risk  (04/08/2022)    CCM Care Plan  Allergies  Allergen Reactions   Ace Inhibitors Other (See Comments)    = Slow heart rate   Hydrocodone Nausea And Vomiting   Hydrocodone-Acetaminophen Nausea And Vomiting   Nsaids Other (See Comments)    Kidney issues   Sulfamethoxazole Nausea And Vomiting   Sulfamethoxazole-Trimethoprim Nausea And Vomiting   Trimethoprim Other (See Comments)    Reaction not recalled    Medications Reviewed Today     Reviewed by Lane Hacker, [facility] (Pharmacist) on 05/07/22 at 1441  Med List Status: <None>   Medication Order Taking? Sig Documenting Provider Last Dose Status Informant  Alcohol Swabs (B-D SINGLE USE SWABS REGULAR) PADS [id number] No 1 each by Does not apply route in the morning, at noon, in the evening, and at bedtime. [name], MD Taking Active   aspirin EC 81 MG tablet [id number] No Take 81 mg by mouth daily. Swallow whole. [provider] Taking Active   Coenzyme Q10 100 MG TABS [id number] No Take 100 mg by mouth at bedtime. [provider] Taking Active Spouse/Significant Other  furosemide (LASIX) 20 MG tablet [id number]  TAKE 1 TABLET EVERY DAY AS NEEDED Cox, Kirsten, MD  Active    gabapentin (NEURONTIN) 400 MG capsule [id number] No TAKE 1 CAPSULE THREE TIMES DAILY Cox, Kirsten, MD Taking Active   glucose blood (TRUE METRIX BLOOD GLUCOSE TEST) test strip [id number] No TEST BLOOD SUGAR THREE TIMES DAILY BEFORE MEALS Cox, Kirsten, MD Taking Active   insulin aspart (NOVOLOG FLEXPEN) 100 UNIT/ML FlexPen [id number] No Up to 16 U per day Check sugar prior to each meal and give dose below. 100 - 150: 4 U. 151 - 200: + 2 U (6 U) 201 - 250: + 4 U (8 U) 251 - 300: + 6 U (10 U) 301 - 350: + 8 U (12 U) 350 - 400: + 10 U (14 U) > 400: + 12 U (16U) Cox, Kirsten, MD Taking Active   Insulin Glargine (TOUJEO MAX SOLOSTAR [place]) [id number] No Inject 16 Units into the skin 2 (two) times daily. [provider] Taking Active   Insulin Pen Needle (DROPLET PEN NEEDLES) 31G X 8 MM MISC [id number] No Gives max of 5 insulin injections daily. Cox, Kirsten, MD Taking Active   Multiple Vitamins-Minerals (PRESERVISION AREDS 2) CAPS [id number] No Take 1 capsule by mouth 2 (two) times daily after a meal. [provider] Taking Active Spouse/Significant Other  Nintedanib (OFEV) 150 MG CAPS [id number]  Take 1 capsule (150 mg total) by mouth 2 (two) times daily. [name], MD  Active   nitroGLYCERIN (NITROSTAT) 0.4 MG SL tablet [id number] No Place 1 tablet (0.4 mg total) under the tongue every 5 (five) minutes as needed for chest pain. Revankar, Reita Cliche, MD Taking Active   omeprazole (PRILOSEC) 20 MG capsule [id number] No Take 1 capsule (20 mg total) by mouth daily. [name], MD Taking Active Spouse/Significant Other  OXYGEN [id number] No Inhale 1 L into the lungs daily. Using as needed during the day and continuous at night. [provider] Taking Active   rosuvastatin (CRESTOR) 20 MG tablet [id number]  TAKE 1 TABLET EVERY DAY Cox, Kirsten, MD  Active   vitamin B-12 (CYANOCOBALAMIN) 1000 MCG tablet [id number] No Take 1 tablet (1,000 mcg total) by  mouth daily. Geradine Girt, DO Taking Active  Spouse/Significant Other            Patient Active Problem List   Diagnosis Date Noted   Chronic respiratory failure with hypoxia ([place]) 03/06/2022   Respiratory disorder concurrent with and due to microscopic polyangiitis ([place]) 08/12/2021   Hyponatremia 08/12/2021   Mixed diabetic hyperlipidemia associated with type 2 diabetes mellitus ([place]) 08/12/2021   Diabetic polyneuropathy associated with type 2 diabetes mellitus ([place]) 08/12/2021   Chronic kidney disease, stage 3a ([place]) 08/12/2021   Type 2 diabetes mellitus with hyperglycemia, with long-term current use of insulin ([place]) 04/23/2021   History of kidney stones 03/29/2021   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[place]) 03/29/2021   Microscopic polyangiitis ([place]) 03/29/2021   Rheumatoid factor positive 03/29/2021   Vasculitis ([place]) 03/29/2021   Idiopathic pulmonary fibrosis ([place]) 03/29/2021   Other long term (current) drug therapy 03/29/2021   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/17/2020   Dysuria 09/07/2020   GAD (generalized anxiety disorder)    Osteoarthritis    Other emphysema ([place])    Primary insomnia    Skin cancer    UC (ulcerative colitis) ([place])    Medicare annual wellness visit, subsequent 08/09/2020   Transaminitis 07/07/2020   Peripheral polyneuropathy 01/30/2020   Body mass index (BMI) 29.0-29.9, adult 01/05/2020   Alpha-1-antitrypsin deficiency carrier 05/04/2019   Elevated rheumatoid factor 04/12/2019   Interstitial pulmonary disease ([place]) 04/12/2019   Abnormal ANCA test 04/12/2019   Abnormal findings on diagnostic imaging of lung 04/12/2019   Polymyositis ([place])    Low vitamin B12 level 04/03/2019   Abnormal CT of the chest    Diabetes mellitus type 2 in obese ([place]) 04/01/2019   Hypertensive heart disease without heart failure 05/03/2018   Mixed hyperlipidemia 05/03/2018   Coronary artery disease involving native coronary artery of native heart without angina pectoris 05/03/2018   GERD without esophagitis  05/03/2018   CKD (chronic kidney disease) 05/03/2018   Atherosclerotic heart disease of native coronary artery without angina pectoris 05/03/2018   Dyslipidemia associated with type 2 diabetes mellitus ([place]) 05/03/2018    Immunization History  Administered Date(s) Administered   PFIZER(Purple Top)SARS-COV-2 Vaccination 10/17/2019, 11/07/2019, 06/05/2020    Conditions to be addressed/monitored:  Hypertension, Hyperlipidemia and Diabetes  Care Plan : CCM Pharmacy Care Plan  Updates made by Lane Hacker, [place] since 05/07/2022 12:00 AM     Problem: htn,hld,dm   Priority: High  Onset Date: 12/06/2020     Long-Range Goal: disease state management   Start Date: 12/06/2020  Expected End Date: 12/06/2021  Recent Progress: On track  Priority: High  Note:     Current Barriers:  Unable to maintain control of urinary symptoms. Seeing urology.   Pharmacist Clinical Goal(s):  Patient will maintain control of urological symptoms as evidenced by improvement in symptoms.   through collaboration with PharmD and provider.    Interventions: 1:1 collaboration with [name], MD regarding development and update of comprehensive plan of care as evidenced by provider attestation and co-signature Inter-disciplinary care team collaboration (see longitudinal plan of care) Comprehensive medication review performed; medication list updated in electronic medical record  Hypertension (BP goal <130/80) BP Readings from Last 3 Encounters:  03/31/22 110/68  03/14/22 (!) 110/58  02/19/22 136/78  -Controlled -Current treatment: Diet -Medications previously tried: losartan  -Current home readings: well controlled per wife.  -Current dietary habits: watching salt.  -Current exercise habits: knee is limiting but will have lots of rehab after surgery.  -  Denies hypotensive/hypertensive symptoms -Educated on BP goals and benefits of medications for prevention of heart attack, stroke and kidney  damage; Daily salt intake goal < 2300 mg; Exercise goal of 150 minutes per week; -Counseled to monitor BP at home weekly, document, and provide log at future appointments -Counseled on diet and exercise extensively Recommended to continue current medication  Hyperlipidemia: (LDL goal < 55) The ASCVD Risk score (Arnett DK, et al., 2019) failed to calculate for the following reasons:   The 2019 ASCVD risk score is only valid for ages 59 to 67 Lab Results  Component Value Date   CHOL 162 03/31/2022   CHOL 162 12/26/2021   CHOL 158 08/07/2021   Lab Results  Component Value Date   HDL 43 03/31/2022   HDL 59 12/26/2021   HDL 30 (L) 08/07/2021   Lab Results  Component Value Date   LDLCALC 96 03/31/2022   LDLCALC 87 12/26/2021   LDLCALC 106 (H) 08/07/2021   Lab Results  Component Value Date   TRIG 127 03/31/2022   TRIG 85 12/26/2021   TRIG 122 08/07/2021   Lab Results  Component Value Date   CHOLHDL 3.8 03/31/2022   CHOLHDL 2.7 12/26/2021   CHOLHDL 5.3 (H) 08/07/2021  No results found for: "LDLDIRECT" Lab Results  Component Value Date   ALT 8 04/30/2022   AST 22 04/30/2022   ALKPHOS 149 (H) 04/30/2022   BILITOT 0.8 04/30/2022  -Uncontrolled -Current treatment: Rosuvastatin 26m Appropriate, Effective, Safe, Accessible -Medications previously tried: atorvastatin/Rosuvastatin (Elevated LFT) -Current dietary patterns: working on healthy diet -Current exercise habits: limited due to knee pain. Will begin rehab after surgery.  -Educated on Cholesterol goals;  Benefits of statin for ASCVD risk reduction; Importance of limiting foods high in cholesterol; Exercise goal of 150 minutes per week; -Counseled on diet and exercise extensively March 2023: Due to patient's age, Fish Oil is not needed but they have been taking it for years and would like to maintain therapy  Diabetes (A1c goal <7%) Lab Results  Component Value Date   HGBA1C 6.6 (H) 03/31/2022   HGBA1C 7.4 (H)  12/26/2021   HGBA1C 6.3 (H) 08/07/2021   Lab Results  Component Value Date   MICROALBUR 10 04/11/2021   LDLCALC 96 03/31/2022   CREATININE 1.09 04/30/2022   Lab Results  Component Value Date   NA 136 04/30/2022   K 4.5 04/30/2022   CREATININE 1.09 04/30/2022   EGFR 67 04/30/2022   GFRNONAA >60 08/23/2021   GLUCOSE 141 (H) 04/30/2022   Lab Results  Component Value Date   WBC 14.5 (H) 03/31/2022   HGB 15.4 03/31/2022   HCT 47.3 03/31/2022   MCV 88 03/31/2022   PLT 317 03/31/2022   Lab Results  Component Value Date   LABMICR <3.0 12/26/2021   MICROALBUR 10 04/11/2021   MICROALBUR 10 03/06/2020  -Controlled -Current medications: True Metrix test strips and lancets Diet/lifestyle  Aspart SS Appropriate, Effective, Safe, Accessible Lantus 16 units BID Query Appropriate -Medications previously tried: none reported  -Current home glucose readings fasting glucose:  March 2023: 90-120 Sept 2023: 93-112 Never below 80 post prandial glucose: n/a -Denies hypoglycemic/hyperglycemic symptoms -Current meal patterns:  Heart-healthy and lower sugar diet. Likes meat, non-starchy vegetables and apples.  -Current exercise: limited due to knee pain.  -Educated on A1c and blood sugar goals; Complications of diabetes including kidney damage, retinal damage, and cardiovascular disease; Exercise goal of 150 minutes per week; Carbohydrate counting and/or plate method -Counseled to check feet  daily and get yearly eye exams -Counseled on diet and exercise extensively March 2023: They are frustrated they have to inject insulin 4x/day but they are resigned to "stick it out" until Steroid is tapered off. Hoping to go down to Prednisone 56m in April, counseled them to call when this happens so his sugars don't drop then Sept 2023: Unsure why Lantus is BID. Will ask PCP to switch to QD to decrease insulin burden (They complained about having to inject as much as they are)  Interstitial Lung  Disease (Goal: improve breathing) -Managed by Pulm -Started on Rituxan by Dr. HTrudie Reedin late 2020 and tapered off Prednisone. Was very stable until 07/29/21 when he contracted Covid, deteriorated, and was hospitalized [facility] from 08/12/21-08/25/21 -Not ideally controlled -Current treatment  Prednisone 347m(Was on 6068mnd is tapering 22m62mnth) Appropriate, Effective, Safe, Accessible Lasix 20mg56mAppropriate, Effective, Safe, Accessible -Medications previously tried: Rituxan (Held after Covid)  March 2023: Has next appt with Pulm in April 2023, hoping to go down to 20mg 30mnisone then.     Patient Goals/Self-Care Activities Patient will:  - take medications as prescribed focus on medication adherence by using pill box check glucose 2-3 times weekly, document, and provide at future appointments check blood pressure weekly, document, and provide at future appointments target a minimum of 150 minutes of moderate intensity exercise weekly  Follow Up Plan: Telephone follow up appointment with care management team member scheduled for: PRN  NathanArizona Constablem.D. - [phone]       Medication Assistance: None required.  Patient affirms current coverage meets needs.  Care Gaps: Last eye exam / Retinopathy Screening? 04/02/21 -Recommended re-seen Last Annual Wellness Visit? None noted Last Diabetic Foot Exam? 12/26/21   Star Rating Drugs:  Medication:                Last Fill:         Day Supply None  [place]:  [place] [address] [place] [address] [address]: [phone] Nakya [address] [address]: [phone] [address] [address] [address]: [phone] pill box? Yes Pt endorses good compliance  We discussed: Benefits of medication synchronization, packaging and delivery as well as enhanced pharmacist oversight with [employer]. Patient decided to: Continue current medication management strategy  Care Plan and Follow Up Patient Decision:  Patient agrees to Care Plan and Follow-up.  Plan: Telephone follow up appointment with care management team member scheduled for:  PRN  NathanArizona Constablem.D. - [phone]

## 2022-05-07 NOTE — Patient Instructions (Signed)
Visit Information   Goals Addressed   None    Patient Care Plan: CCM Pharmacy Care Plan     Problem Identified: htn,hld,dm   Priority: High  Onset Date: 12/06/2020     Long-Range Goal: disease state management   Start Date: 12/06/2020  Expected End Date: 12/06/2021  Recent Progress: On track  Priority: High  Note:     Current Barriers:  Unable to maintain control of urinary symptoms. Seeing urology.   Pharmacist Clinical Goal(s):  Patient will maintain control of urological symptoms as evidenced by improvement in symptoms.   through collaboration with PharmD and provider.    Interventions: 1:1 collaboration with [name], MD regarding development and update of comprehensive plan of care as evidenced by provider attestation and co-signature Inter-disciplinary care team collaboration (see longitudinal plan of care) Comprehensive medication review performed; medication list updated in electronic medical record  Hypertension (BP goal <130/80) BP Readings from Last 3 Encounters:  03/31/22 110/68  03/14/22 (!) 110/58  02/19/22 136/78  -Controlled -Current treatment: Diet -Medications previously tried: losartan  -Current home readings: well controlled per wife.  -Current dietary habits: watching salt.  -Current exercise habits: knee is limiting but will have lots of rehab after surgery.  -Denies hypotensive/hypertensive symptoms -Educated on BP goals and benefits of medications for prevention of heart attack, stroke and kidney damage; Daily salt intake goal < 2300 mg; Exercise goal of 150 minutes per week; -Counseled to monitor BP at home weekly, document, and provide log at future appointments -Counseled on diet and exercise extensively Recommended to continue current medication  Hyperlipidemia: (LDL goal < 55) The ASCVD Risk score (Arnett DK, et al., 2019) failed to calculate for the following reasons:   The 2019 ASCVD risk score is only valid for ages 84 to  75 Lab Results  Component Value Date   CHOL 162 03/31/2022   CHOL 162 12/26/2021   CHOL 158 08/07/2021   Lab Results  Component Value Date   HDL 43 03/31/2022   HDL 59 12/26/2021   HDL 30 (L) 08/07/2021   Lab Results  Component Value Date   LDLCALC 96 03/31/2022   LDLCALC 87 12/26/2021   LDLCALC 106 (H) 08/07/2021   Lab Results  Component Value Date   TRIG 127 03/31/2022   TRIG 85 12/26/2021   TRIG 122 08/07/2021   Lab Results  Component Value Date   CHOLHDL 3.8 03/31/2022   CHOLHDL 2.7 12/26/2021   CHOLHDL 5.3 (H) 08/07/2021  No results found for: "LDLDIRECT" Lab Results  Component Value Date   ALT 8 04/30/2022   AST 22 04/30/2022   ALKPHOS 149 (H) 04/30/2022   BILITOT 0.8 04/30/2022  -Uncontrolled -Current treatment: Rosuvastatin 82m Appropriate, Effective, Safe, Accessible -Medications previously tried: atorvastatin/Rosuvastatin (Elevated LFT) -Current dietary patterns: working on healthy diet -Current exercise habits: limited due to knee pain. Will begin rehab after surgery.  -Educated on Cholesterol goals;  Benefits of statin for ASCVD risk reduction; Importance of limiting foods high in cholesterol; Exercise goal of 150 minutes per week; -Counseled on diet and exercise extensively March 2023: Due to patient's age, Fish Oil is not needed but they have been taking it for years and would like to maintain therapy  Diabetes (A1c goal <7%) Lab Results  Component Value Date   HGBA1C 6.6 (H) 03/31/2022   HGBA1C 7.4 (H) 12/26/2021   HGBA1C 6.3 (H) 08/07/2021   Lab Results  Component Value Date   MICROALBUR 10 04/11/2021   [place] 03/31/2022  CREATININE 1.09 04/30/2022   Lab Results  Component Value Date   NA 136 04/30/2022   K 4.5 04/30/2022   CREATININE 1.09 04/30/2022   EGFR 67 04/30/2022   GFRNONAA >60 08/23/2021   GLUCOSE 141 (H) 04/30/2022   Lab Results  Component Value Date   WBC 14.5 (H) 03/31/2022   HGB 15.4 03/31/2022   HCT 47.3  03/31/2022   MCV 88 03/31/2022   PLT 317 03/31/2022   Lab Results  Component Value Date   LABMICR <3.0 12/26/2021   MICROALBUR 10 04/11/2021   MICROALBUR 10 03/06/2020  -Controlled -Current medications: True Metrix test strips and lancets Diet/lifestyle  Aspart SS Appropriate, Effective, Safe, Accessible Lantus 16 units BID Query Appropriate -Medications previously tried: none reported  -Current home glucose readings fasting glucose:  March 2023: 90-120 Sept 2023: 93-112 Never below 80 post prandial glucose: n/a -Denies hypoglycemic/hyperglycemic symptoms -Current meal patterns:  Heart-healthy and lower sugar diet. Likes meat, non-starchy vegetables and apples.  -Current exercise: limited due to knee pain.  -Educated on A1c and blood sugar goals; Complications of diabetes including kidney damage, retinal damage, and cardiovascular disease; Exercise goal of 150 minutes per week; Carbohydrate counting and/or plate method -Counseled to check feet daily and get yearly eye exams -Counseled on diet and exercise extensively March 2023: They are frustrated they have to inject insulin 4x/day but they are resigned to "stick it out" until Steroid is tapered off. Hoping to go down to Prednisone 56m in April, counseled them to call when this happens so his sugars don't drop then Sept 2023: Unsure why Lantus is BID. Will ask PCP to switch to QD to decrease insulin burden (They complained about having to inject as much as they are)  Interstitial Lung Disease (Goal: improve breathing) -Managed by Pulm -Started on Rituxan by Dr. HTrudie Reedin late 2020 and tapered off Prednisone. Was very stable until 07/29/21 when he contracted Covid, deteriorated, and was hospitalized [facility] from 08/12/21-08/25/21 -Not ideally controlled -Current treatment  Prednisone 351m(Was on 606mnd is tapering 79m69mnth) Appropriate, Effective, Safe, Accessible Lasix 20mg7mAppropriate, Effective, Safe,  Accessible -Medications previously tried: Rituxan (Held after Covid)  March 2023: Has next appt with Pulm in April 2023, hoping to go down to 20mg 65mnisone then.     Patient Goals/Self-Care Activities Patient will:  - take medications as prescribed focus on medication adherence by using pill box check glucose 2-3 times weekly, document, and provide at future appointments check blood pressure weekly, document, and provide at future appointments target a minimum of 150 minutes of moderate intensity exercise weekly  Follow Up Plan: Telephone follow up appointment with care management team member scheduled for: PRN  NathanArizona Constablem.D. - [phone]-- [phone]. FoglemHoefleiven information about Chronic Care Management services today including:  CCM service includes personalized support from designated clinical staff supervised by his physician, including individualized plan of care and coordination with other care providers 24/7 contact phone numbers for assistance for urgent and routine care needs. Standard insurance, coinsurance, copays and deductibles apply for chronic care management only during months in which we provide at least 20 minutes of these services. Most insurances cover these services at 100%, however patients may be responsible for any copay, coinsurance and/or deductible if applicable. This service may help you avoid the need for more expensive face-to-face services. Only one practitioner may furnish and bill the service in a calendar month. The patient may stop CCM services at any  time (effective at the end of the month) by phone call to the office staff.  Patient agreed to services and verbal consent obtained.   The patient verbalized understanding of instructions, educational materials, and care plan provided today and DECLINED offer to receive copy of patient instructions, educational materials, and care plan.  CCM f/u prn  Lane Hacker, [place]

## 2022-05-09 ENCOUNTER — Telehealth: Payer: Self-pay | Admitting: *Deleted

## 2022-05-09 NOTE — Patient Outreach (Signed)
  Care Coordination   Initial Visit Note   05/09/2022 Name: Kerry Matthews MRN: [id number] DOB: 21-Jan-1936  Kerry Matthews is a 86 y.o. year old male who sees Cox, Kirsten, MD for primary care. I spoke with  Kerry Matthews by phone today.  What matters to the patients health and wellness today?  Pt declines need for Care Coordination services/support. States he had his AWV already.    Goals Addressed   None     SDOH assessments and interventions completed:  No     Care Coordination Interventions Activated:  No  Care Coordination Interventions:  No, not indicated   Follow up plan: No further intervention required.   Encounter Outcome:  No Answer

## 2022-05-22 DIAGNOSIS — J8410 Pulmonary fibrosis, unspecified: Secondary | ICD-10-CM | POA: Diagnosis not present

## 2022-05-22 DIAGNOSIS — J84112 Idiopathic pulmonary fibrosis: Secondary | ICD-10-CM | POA: Diagnosis not present

## 2022-05-22 DIAGNOSIS — Z6827 Body mass index (BMI) 27.0-27.9, adult: Secondary | ICD-10-CM | POA: Diagnosis not present

## 2022-05-22 DIAGNOSIS — M317 Microscopic polyangiitis: Secondary | ICD-10-CM | POA: Diagnosis not present

## 2022-05-22 DIAGNOSIS — Z79899 Other long term (current) drug therapy: Secondary | ICD-10-CM | POA: Diagnosis not present

## 2022-05-22 DIAGNOSIS — E663 Overweight: Secondary | ICD-10-CM | POA: Diagnosis not present

## 2022-05-22 DIAGNOSIS — R768 Other specified abnormal immunological findings in serum: Secondary | ICD-10-CM | POA: Diagnosis not present

## 2022-05-22 DIAGNOSIS — M25561 Pain in right knee: Secondary | ICD-10-CM | POA: Diagnosis not present

## 2022-05-23 ENCOUNTER — Ambulatory Visit (INDEPENDENT_AMBULATORY_CARE_PROVIDER_SITE_OTHER): Payer: Medicare HMO

## 2022-05-23 DIAGNOSIS — Z23 Encounter for immunization: Secondary | ICD-10-CM | POA: Diagnosis not present

## 2022-05-24 DIAGNOSIS — E1159 Type 2 diabetes mellitus with other circulatory complications: Secondary | ICD-10-CM

## 2022-05-24 DIAGNOSIS — I2510 Atherosclerotic heart disease of native coronary artery without angina pectoris: Secondary | ICD-10-CM | POA: Diagnosis not present

## 2022-05-24 DIAGNOSIS — Z794 Long term (current) use of insulin: Secondary | ICD-10-CM | POA: Diagnosis not present

## 2022-05-24 DIAGNOSIS — E785 Hyperlipidemia, unspecified: Secondary | ICD-10-CM

## 2022-05-24 DIAGNOSIS — I119 Hypertensive heart disease without heart failure: Secondary | ICD-10-CM

## 2022-05-28 ENCOUNTER — Telehealth: Payer: Self-pay

## 2022-05-28 NOTE — Telephone Encounter (Signed)
Patient's wife called stating that the patient has been doing well with the just the long acting insulin, she states that he is no longer doing the short acting insulin but only doing the long acting. Patients wife states that his fbs has been any where from 83-145, but more on average any where from 100-115. She states he maybe has five days left and he is doing 8 units in the morning and 4 units in the evening. She is wanting to know if you are going to refill the long acting insulin? Please advise.

## 2022-06-02 ENCOUNTER — Telehealth: Payer: Self-pay

## 2022-06-02 MED ORDER — METFORMIN HCL 500 MG PO TABS: 500.0000 mg | ORAL_TABLET | Freq: Every day | ORAL | 0 refills | Status: DC

## 2022-06-02 NOTE — Telephone Encounter (Signed)
[name] is  calling to inquire about the Metformin and if we are switching him back to it? He states when his wife called back to let us know if he wanted to go back on the metformin she stated that he did but we were going to have to look back through his chart to see why we had discontinued the medication and he states he has not heard anything back and only has 1 day left of his insulin and is concerned. Please advise.   Per Dr. Tobie Poet Metformin was originally stopped due to kidney function which she states not it has normalized. Per Dr. Tobie Poet patient is to re-start Metformin 500 mg once daily.

## 2022-06-09 ENCOUNTER — Ambulatory Visit: Payer: Medicare HMO | Admitting: Podiatry

## 2022-06-09 DIAGNOSIS — Z794 Long term (current) use of insulin: Secondary | ICD-10-CM

## 2022-06-09 DIAGNOSIS — E1165 Type 2 diabetes mellitus with hyperglycemia: Secondary | ICD-10-CM

## 2022-06-09 DIAGNOSIS — B351 Tinea unguium: Secondary | ICD-10-CM

## 2022-06-09 DIAGNOSIS — M79674 Pain in right toe(s): Secondary | ICD-10-CM | POA: Diagnosis not present

## 2022-06-09 DIAGNOSIS — M79675 Pain in left toe(s): Secondary | ICD-10-CM | POA: Diagnosis not present

## 2022-06-09 DIAGNOSIS — G629 Polyneuropathy, unspecified: Secondary | ICD-10-CM

## 2022-06-09 NOTE — Progress Notes (Signed)
  Subjective:  Patient ID: Kerry Matthews, male    DOB: [birth year],  MRN: [id number]  Chief Complaint  Patient presents with   Nail Problem    Thick painful toenails, 3 month follow up     86 y.o. male presents with the above complaint. History confirmed with patient. Patient presenting with pain related to dystrophic thickened elongated nails. Patient is unable to trim own nails related to nail dystrophy and/or mobility issues. Patient does have a history of T2DM with peripheral neuropathy.   Objective:  Physical Exam: warm, good capillary refill, DP and PT pulses 2/4 bilateral nail exam onychomycosis of the toenails, onycholysis, and dystrophic nails DP pulses palpable, PT pulses palpable, and protective sensation absent Left Foot:  Pain with palpation of nails due to elongation and dystrophic growth.  Right Foot: Pain with palpation of nails due to elongation and dystrophic growth.   Assessment:   1. Pain due to onychomycosis of toenails of both feet   2. Peripheral polyneuropathy   3. Type 2 diabetes mellitus with hyperglycemia, with long-term current use of insulin ([place])      Plan:  Patient was evaluated and treated and all questions answered.  #Onychomycosis with pain  -Nails palliatively debrided as below. -Educated on self-care  Procedure: Nail Debridement Rationale: Pain Type of Debridement: manual, sharp debridement. Instrumentation: Nail nipper, rotary burr. Number of Nails: 10  Return in about 3 months (around 09/09/2022) for RFC.         Everitt Amber, DPM [facility]

## 2022-06-14 ENCOUNTER — Other Ambulatory Visit: Payer: Self-pay | Admitting: Family Medicine

## 2022-06-19 DIAGNOSIS — C44529 Squamous cell carcinoma of skin of other part of trunk: Secondary | ICD-10-CM | POA: Diagnosis not present

## 2022-06-19 DIAGNOSIS — L578 Other skin changes due to chronic exposure to nonionizing radiation: Secondary | ICD-10-CM | POA: Diagnosis not present

## 2022-06-19 DIAGNOSIS — L820 Inflamed seborrheic keratosis: Secondary | ICD-10-CM | POA: Diagnosis not present

## 2022-06-19 DIAGNOSIS — L570 Actinic keratosis: Secondary | ICD-10-CM | POA: Diagnosis not present

## 2022-06-19 DIAGNOSIS — L821 Other seborrheic keratosis: Secondary | ICD-10-CM | POA: Diagnosis not present

## 2022-06-24 DIAGNOSIS — J189 Pneumonia, unspecified organism: Secondary | ICD-10-CM | POA: Diagnosis not present

## 2022-06-24 DIAGNOSIS — J988 Other specified respiratory disorders: Secondary | ICD-10-CM | POA: Diagnosis not present

## 2022-06-24 DIAGNOSIS — M317 Microscopic polyangiitis: Secondary | ICD-10-CM | POA: Diagnosis not present

## 2022-07-01 ENCOUNTER — Ambulatory Visit: Payer: Medicare HMO | Admitting: Pulmonary Disease

## 2022-07-01 DIAGNOSIS — Z79899 Other long term (current) drug therapy: Secondary | ICD-10-CM | POA: Diagnosis not present

## 2022-07-01 DIAGNOSIS — M317 Microscopic polyangiitis: Secondary | ICD-10-CM | POA: Diagnosis not present

## 2022-07-01 DIAGNOSIS — Z111 Encounter for screening for respiratory tuberculosis: Secondary | ICD-10-CM | POA: Diagnosis not present

## 2022-07-01 DIAGNOSIS — R5383 Other fatigue: Secondary | ICD-10-CM | POA: Diagnosis not present

## 2022-07-08 ENCOUNTER — Ambulatory Visit (INDEPENDENT_AMBULATORY_CARE_PROVIDER_SITE_OTHER): Payer: Medicare HMO | Admitting: Family Medicine

## 2022-07-08 ENCOUNTER — Encounter: Payer: Self-pay | Admitting: Family Medicine

## 2022-07-08 VITALS — BP 110/54 | HR 72 | Temp 96.9°F | Resp 18 | Ht 66.0 in | Wt 171.2 lb

## 2022-07-08 DIAGNOSIS — I119 Hypertensive heart disease without heart failure: Secondary | ICD-10-CM

## 2022-07-08 DIAGNOSIS — E782 Mixed hyperlipidemia: Secondary | ICD-10-CM | POA: Diagnosis not present

## 2022-07-08 DIAGNOSIS — N390 Urinary tract infection, site not specified: Secondary | ICD-10-CM | POA: Diagnosis not present

## 2022-07-08 DIAGNOSIS — E1142 Type 2 diabetes mellitus with diabetic polyneuropathy: Secondary | ICD-10-CM

## 2022-07-08 DIAGNOSIS — M317 Microscopic polyangiitis: Secondary | ICD-10-CM | POA: Diagnosis not present

## 2022-07-08 DIAGNOSIS — Z23 Encounter for immunization: Secondary | ICD-10-CM | POA: Diagnosis not present

## 2022-07-08 DIAGNOSIS — K219 Gastro-esophageal reflux disease without esophagitis: Secondary | ICD-10-CM

## 2022-07-08 DIAGNOSIS — Z6827 Body mass index (BMI) 27.0-27.9, adult: Secondary | ICD-10-CM

## 2022-07-08 DIAGNOSIS — M109 Gout, unspecified: Secondary | ICD-10-CM | POA: Insufficient documentation

## 2022-07-08 HISTORY — DX: Body mass index (BMI) 27.0-27.9, adult: Z68.27

## 2022-07-08 HISTORY — DX: Gout, unspecified: M10.9

## 2022-07-08 LAB — POCT URINALYSIS DIP (CLINITEK)
Bilirubin, UA: NEGATIVE
Blood, UA: NEGATIVE
Glucose, UA: NEGATIVE mg/dL
Ketones, POC UA: NEGATIVE mg/dL
Leukocytes, UA: NEGATIVE
Nitrite, UA: NEGATIVE
POC PROTEIN,UA: NEGATIVE
Spec Grav, UA: 1.015 (ref 1.010–1.025)
Urobilinogen, UA: 0.2 E.U./dL
pH, UA: 5.5 (ref 5.0–8.0)

## 2022-07-08 LAB — HM DIABETES EYE EXAM

## 2022-07-08 MED ORDER — COLCHICINE 0.6 MG PO TABS: 0.6000 mg | ORAL_TABLET | Freq: Two times a day (BID) | ORAL | 1 refills | Status: DC

## 2022-07-08 NOTE — Progress Notes (Unsigned)
Subjective:  Patient ID: Kerry Matthews, male    DOB: 06/19/1936  Age: 86 y.o. MRN: [id number]  Chief Complaint  Patient presents with   Diabetes   Hypertension    HPI Diabetes:  Complications: neuropathy Glucose checking: before meals and before bed  Glucose logs: AM: 107-115, Lunch 78-121, Supper 90-137, before bed 88-123.  Hypoglycemia: 88 was the low Current medications: On metformin 500 mg once daily. Gabapentin 400 mg one twice a day helps with neuropathy. Last Eye Exam: 04/02/2021. Needs to make an appointment Foot checks: daily.    Hyperlipidemia: Current medications: rosuvastatin 20 mg once daily and coenzyme q10 daily   Hypertensive heart disease. Has history of stent  Current medications: lasix 20 mg once daily as needed, aspirin 81 mg daily, and crestor 20 mg once daily.   130-131/68-74   GERD: on omeprazole 20 mg daily  Diet: eating healthy. Exercise:release from home health care.  Walking outside.  Microscopic polyangitis: pulmonary and rheumatology.  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 Sees pulmonology.Marland Kitchen wears oxygen at night 1 L. Intolerant to [place]. Getting rituxan every 2 weeks.    B12 deficiency: otc b12 1000 mcg once daily.     Current Outpatient Medications on File Prior to Visit  Medication Sig Dispense Refill   Alcohol Swabs (B-D SINGLE USE SWABS REGULAR) PADS 1 each by Does not apply route in the morning, at noon, in the evening, and at bedtime. 300 each 3   aspirin EC 81 MG tablet Take 81 mg by mouth daily. Swallow whole.     Coenzyme Q10 100 MG TABS Take 100 mg by mouth at bedtime.     furosemide (LASIX) 20 MG tablet TAKE 1 TABLET EVERY DAY AS NEEDED 90 tablet 1   gabapentin (NEURONTIN) 400 MG capsule TAKE 1 CAPSULE THREE TIMES DAILY (Patient taking differently: Take 400 mg by mouth 2 (two) times daily.) 270 capsule 0   glucose blood (TRUE METRIX BLOOD GLUCOSE TEST) test strip TEST BLOOD SUGAR THREE TIMES DAILY BEFORE  MEALS 300 strip 3   metFORMIN (GLUCOPHAGE) 500 MG tablet Take 1 tablet (500 mg total) by mouth daily with breakfast. 90 tablet 0   Multiple Vitamins-Minerals (PRESERVISION AREDS 2) CAPS Take 1 capsule by mouth 2 (two) times daily after a meal.     nitroGLYCERIN (NITROSTAT) 0.4 MG SL tablet Place 1 tablet (0.4 mg total) under the tongue every 5 (five) minutes as needed for chest pain. 25 tablet 9   omeprazole (PRILOSEC) 20 MG capsule TAKE 1 CAPSULE EVERY DAY 90 capsule 1   OXYGEN Inhale 1 L into the lungs daily. Using as needed during the day and continuous at night.     rosuvastatin (CRESTOR) 20 MG tablet TAKE 1 TABLET EVERY DAY 90 tablet 1   vitamin B-12 (CYANOCOBALAMIN) 1000 MCG tablet Take 1 tablet (1,000 mcg total) by mouth daily. 30 tablet 0   No current facility-administered medications on file prior to visit.   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  ANCA antibodies- myeloperoxidase ABS-greater than 100, ANCA proteinase 3-less than 3.5 04/02/2019- ANCA titers- p-ANCA +1: 160, C ANCA-less than 1: 20, atypical p-ANCA titer-less than 1: 20   Abnormal CT of the chest    Abnormal findings on diagnostic imaging of lung 04/12/2019   04/01/2019-CT chest with contrast- no acute process in chest abdomen or pelvis, interstitial lung disease suspicious for early or mild UIP, pulmonary artery enlargement suggest [facility]  Acute pain of left knee 11/30/2019   Alpha-1-antitrypsin deficiency carrier 05/04/2019   Atherosclerotic heart disease of native coronary artery without angina pectoris 05/03/2018   Body mass index (BMI) 29.0-29.9, adult 01/05/2020   Bradycardia    Chronic kidney disease, stage 3a ([place]) 08/12/2021   CKD (chronic kidney disease) 05/03/2018   Coronary artery disease    Coronary artery disease involving native coronary artery of native heart without angina pectoris 05/03/2018   Diabetes mellitus type 2 in obese ([place]) 04/01/2019    Diabetic polyneuropathy associated with type 2 diabetes mellitus ([place]) 08/12/2021   Dyslipidemia associated with type 2 diabetes mellitus ([place]) 05/03/2018   Elevated rheumatoid factor 04/12/2019   04/03/2019-rheumatoid factor-95.4   Essential hypertension 05/03/2018   GAD (generalized anxiety disorder)    GERD without esophagitis 05/03/2018   Hematuria 09/07/2020   History of kidney stones    Hypertensive heart disease without heart failure 05/03/2018   Hyponatremia 08/12/2021   Idiopathic pulmonary fibrosis ([place]) 03/29/2021   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   Insomnia 05/03/2018   Interstitial pulmonary disease ([place]) 04/12/2019   04/01/2019-CT chest with contrast- no acute process in chest abdomen or pelvis, interstitial lung disease suspicious for early or mild UIP, pulmonary artery enlargement suggest PAH  04/02/2019-connective tissue work-up: Anti-Jo 1-negative Anti-DNA antibody double-stranded-negative Anti-scleroderma antibody-negative Sjogren's syndrome antibody-negative Sjogren's syndrome antibody-negative CK-31 CCP-6 E   Leukocytosis 03/08/2020   Low vitamin B12 level 04/03/2019   Lower extremity edema    Medicare annual wellness visit, subsequent 08/09/2020   Microscopic polyangiitis ([place]) 03/29/2021   Mixed diabetic hyperlipidemia associated with type 2 diabetes mellitus ([place]) 08/12/2021   Mixed hyperlipidemia 05/03/2018   Myalgia 04/19/2019   Osteoarthritis    Other emphysema ([place])    Other long term (current) drug therapy 03/29/2021   Peripheral polyneuropathy 01/30/2020   Polymyositis ([place])    Primary insomnia    Proteinuria 09/07/2020   Renal insufficiency 09/06/2020   Respiratory disorder concurrent with and due to microscopic polyangiitis ([place]) 08/12/2021   Rheumatoid factor positive 03/29/2021   Sepsis ([place]) 04/19/2019   Skin cancer    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/17/2020   Transaminitis    Trigger finger 05/03/2018   Type 2 diabetes mellitus with  hyperglycemia, with long-term current use of insulin ([place]) 04/23/2021   UC (ulcerative colitis) ([place])    UTI (urinary tract infection)    Vasculitis ([place]) 03/29/2021   Past Surgical History:  Procedure Laterality Date   ANGIOPLASTY  2010   BACK SURGERY     CATARACT EXTRACTION     COLONOSCOPY  06/16/2005   Mild colitis involving splenic flexure. Colonic polyps, status post polypectomy. Mild pancolonic diverticulitits. Internal hemorrhoids.    ESOPHAGOGASTRODUODENOSCOPY  04/26/2003   Irregular Z line suggestive of GERD. Mild gastritis status post CLO testing.    TOTAL KNEE ARTHROPLASTY Left 12/17/2020   Procedure: LEFT TOTAL KNEE ARTHROPLASTY;  Surgeon: Leandrew Koyanagi, MD;  Location: [facility];  Service: Orthopedics;  Laterality: Left;   TRIGGER FINGER RELEASE      Family History  Problem Relation Age of Onset   Tuberculosis Mother    Stroke Father    Pancreatic cancer Sister    Heart attack Sister    Lung disease Sister    Clotting disorder Brother    Colon cancer Neg Hx    Esophageal cancer Neg Hx    Social History   Socioeconomic History   Marital status: Married    Spouse name: Not on file  Number of children: 2   Years of education: Not on file   Highest education level: Not on file  Occupational History   Occupation: retired  Tobacco Use   Smoking status: Never   Smokeless tobacco: Never  Vaping Use   Vaping Use: Never used  Substance and Sexual Activity   Alcohol use: Never   Drug use: Never   Sexual activity: Not on file  Other Topics Concern   Not on file  Social History Narrative   Not on file   Social Determinants of Health   Financial Resource Strain: Low Risk  (05/07/2022)   Overall Financial Resource Strain (CARDIA)    Difficulty of Paying Living Expenses: Not hard at all  Food Insecurity: No Food Insecurity (04/08/2022)   Hunger Vital Sign    Worried About Running Out of Food in the Last Year: Never true    [place] in the Last Year: Never  true  Transportation Needs: No Transportation Needs (05/07/2022)   PRAPARE - Hydrologist (Medical): No    Lack of Transportation (Non-Medical): No  Physical Activity: Sufficiently Active (06/07/2020)   Exercise Vital Sign    Days of Exercise per Week: 5 days    Minutes of Exercise per Session: 30 min  Stress: Not on file  Social Connections: Not on file    Review of Systems  Constitutional:  Positive for fatigue. Negative for chills and fever.  HENT:  Negative for congestion, rhinorrhea and sore throat.   Respiratory:  Positive for cough and shortness of breath.   Cardiovascular:  Negative for chest pain, palpitations and leg swelling.  Gastrointestinal:  Positive for constipation (improved w/miralax prn). Negative for abdominal pain, diarrhea, nausea and vomiting.  Genitourinary:  Negative for dysuria and urgency.  Musculoskeletal:  Positive for arthralgias (right thumb locking, left wrist pain) and back pain. Negative for myalgias.       Pain left great toe   Neurological:  Positive for weakness. Negative for dizziness and headaches.  Psychiatric/Behavioral:  Negative for dysphoric mood. The patient is not nervous/anxious.      Objective:  BP (!) 110/54   Pulse 72   Temp (!) 96.9 F (36.1 C)   Resp 18   Ht '5\' 6"'$  (1.676 m)   Wt 171 lb 3.2 oz (77.7 kg)   BMI 27.63 kg/m      07/08/2022    8:25 AM 03/31/2022    7:38 AM 03/14/2022   11:31 AM  BP/Weight  Systolic BP 127 517 001  Diastolic BP 54 68 58  Wt. (Lbs) 171.2 176 182  BMI 27.63 kg/m2 28.41 kg/m2 30.29 kg/m2    Physical Exam Vitals reviewed.  Constitutional:      Appearance: Normal appearance.  Neck:     Vascular: No carotid bruit.  Cardiovascular:     Rate and Rhythm: Normal rate and regular rhythm.     Pulses: Normal pulses.     Heart sounds: Normal heart sounds.  Pulmonary:     Effort: Pulmonary effort is normal.     Breath sounds: Normal breath sounds. No wheezing, rhonchi  or rales.  Abdominal:     General: Bowel sounds are normal.     Palpations: Abdomen is soft.     Tenderness: There is no abdominal tenderness.  Musculoskeletal:        General: Tenderness (left great toe tender/warm/red.) present.  Neurological:     Mental Status: He is alert and oriented to  person, place, and time.  Psychiatric:        Mood and Affect: Mood normal.        Behavior: Behavior normal.     Diabetic Foot Exam - Simple   Simple Foot Form Diabetic Foot exam was performed with the following findings: Yes 07/08/2022  9:17 AM  Visual Inspection See comments: Yes Sensation Testing Intact to touch and monofilament testing bilaterally: Yes Pulse Check Posterior Tibialis and Dorsalis pulse intact bilaterally: Yes Comments Left great toe - increase warmth, erythema, tender.       Lab Results  Component Value Date   WBC 15.5 (H) 07/08/2022   HGB 14.9 07/08/2022   HCT 45.6 07/08/2022   PLT 300 07/08/2022   GLUCOSE 115 (H) 07/08/2022   CHOL 160 07/08/2022   TRIG 136 07/08/2022   HDL 42 07/08/2022   LDLCALC 94 07/08/2022   ALT 9 07/08/2022   AST 18 07/08/2022   NA 138 07/08/2022   K 4.9 07/08/2022   CL 98 07/08/2022   CREATININE 1.19 07/08/2022   BUN 15 07/08/2022   CO2 24 07/08/2022   TSH 1.320 03/14/2022   INR 1.1 12/13/2020   HGBA1C 6.1 (H) 07/08/2022   MICROALBUR 10 04/11/2021      Assessment & Plan:   Problem List Items Addressed This Visit       Cardiovascular and Mediastinum   Hypertensive heart disease without heart failure    Well controlled.  No changes to medicines. Continue lasix 20 mg once daily as needed, aspirin 81 mg daily, and crestor 20 mg once daily.   Management per specialist.        Microscopic polyangiitis ([place])    Management per specialist.          Digestive   GERD without esophagitis    Continue omeprazole 20 mg daily.         Endocrine   Diabetic polyneuropathy associated with type 2 diabetes mellitus ([place])     Control: good Recommend check sugars fasting daily. Recommend check feet daily. Recommend annual eye exams. Medicines: continue metformin 500 mg daily.  Continue to work on eating a healthy diet and exercise.  Labs drawn today.         Relevant Orders   CBC with Differential/Platelet (Completed)   Comprehensive metabolic panel (Completed)   Hemoglobin A1c (Completed)   Microalbumin/Creatinine Ratio, Urine (Completed)     Musculoskeletal and Integument   Gouty arthritis of left great toe - Primary    Start colchicine 0.6 mg twice daily x 7 days.      Relevant Medications   colchicine 0.6 MG tablet   Other Relevant Orders   Uric acid (Completed)     Genitourinary   Recurrent UTI    UA normal.       Relevant Orders   POCT URINALYSIS DIP (CLINITEK) (Completed)     Other   Mixed hyperlipidemia    Well controlled.  No changes to medicines.  Continue to work on eating a healthy diet and exercise.  Labs drawn today.        Relevant Orders   Lipid panel (Completed)   Cardiovascular Risk Assessment (Completed)   Adult BMI 27.0-27.9 kg/sq m    Recommend continue to work on eating healthy diet and exercise.       Other Visit Diagnoses     Need for vaccination       Relevant Orders   Pneumococcal conjugate vaccine 20-valent (Prevnar 20) (Completed)     .  Meds ordered this encounter  Medications   colchicine 0.6 MG tablet    Sig: Take 1 tablet (0.6 mg total) by mouth 2 (two) times daily. With food    Dispense:  14 tablet    Refill:  1    Orders Placed This Encounter  Procedures   Pneumococcal conjugate vaccine 20-valent (Prevnar 20)   CBC with Differential/Platelet   Comprehensive metabolic panel   Hemoglobin A1c   Lipid panel   Uric acid   Microalbumin/Creatinine Ratio, Urine   Cardiovascular Risk Assessment   POCT URINALYSIS DIP (CLINITEK)     Follow-up: Return in about 3 months (around 10/08/2022) for chronic fasting.  An After Visit Summary  was printed and given to the patient.  [name], MD [facility] [phone]

## 2022-07-09 LAB — COMPREHENSIVE METABOLIC PANEL
ALT: 9 IU/L (ref 0–44)
AST: 18 IU/L (ref 0–40)
Albumin/Globulin Ratio: 1.6 (ref 1.2–2.2)
Albumin: 4.1 g/dL (ref 3.7–4.7)
Alkaline Phosphatase: 134 IU/L — ABNORMAL HIGH (ref 44–121)
BUN/Creatinine Ratio: 13 (ref 10–24)
BUN: 15 mg/dL (ref 8–27)
Bilirubin Total: 1 mg/dL (ref 0.0–1.2)
CO2: 24 mmol/L (ref 20–29)
Calcium: 9.4 mg/dL (ref 8.6–10.2)
Chloride: 98 mmol/L (ref 96–106)
Creatinine, Ser: 1.19 mg/dL (ref 0.76–1.27)
Globulin, Total: 2.6 g/dL (ref 1.5–4.5)
Glucose: 115 mg/dL — ABNORMAL HIGH (ref 70–99)
Potassium: 4.9 mmol/L (ref 3.5–5.2)
Sodium: 138 mmol/L (ref 134–144)
Total Protein: 6.7 g/dL (ref 6.0–8.5)
eGFR: 60 mL/min/{1.73_m2} (ref >59)

## 2022-07-09 LAB — CBC WITH DIFFERENTIAL/PLATELET
Basophils Absolute: 0.1 10*3/uL (ref 0.0–0.2)
Basos: 1 %
EOS (ABSOLUTE): 0.8 10*3/uL — ABNORMAL HIGH (ref 0.0–0.4)
Eos: 5 %
Hematocrit: 45.6 % (ref 37.5–51.0)
Hemoglobin: 14.9 g/dL (ref 13.0–17.7)
Immature Grans (Abs): 0 10*3/uL (ref 0.0–0.1)
Immature Granulocytes: 0 %
Lymphocytes Absolute: 4.6 10*3/uL — ABNORMAL HIGH (ref 0.7–3.1)
Lymphs: 29 %
MCH: 28.8 pg (ref 26.6–33.0)
MCHC: 32.7 g/dL (ref 31.5–35.7)
MCV: 88 fL (ref 79–97)
Monocytes Absolute: 1.5 10*3/uL — ABNORMAL HIGH (ref 0.1–0.9)
Monocytes: 9 %
Neutrophils Absolute: 8.5 10*3/uL — ABNORMAL HIGH (ref 1.4–7.0)
Neutrophils: 56 %
Platelets: 300 10*3/uL (ref 150–450)
RBC: 5.17 x10E6/uL (ref 4.14–5.80)
RDW: 12.8 % (ref 11.6–15.4)
WBC: 15.5 10*3/uL — ABNORMAL HIGH (ref 3.4–10.8)

## 2022-07-09 LAB — MICROALBUMIN / CREATININE URINE RATIO
Creatinine, Urine: 67.1 mg/dL
Microalb/Creat Ratio: <4 mg/g creat (ref 0–29)
Microalbumin, Urine: <3 ug/mL

## 2022-07-09 LAB — LIPID PANEL
Chol/HDL Ratio: 3.8 ratio (ref 0.0–5.0)
Cholesterol, Total: 160 mg/dL (ref 100–199)
HDL: 42 mg/dL (ref >39)
LDL Chol Calc (NIH): 94 mg/dL (ref 0–99)
Triglycerides: 136 mg/dL (ref 0–149)
VLDL Cholesterol Cal: 24 mg/dL (ref 5–40)

## 2022-07-09 LAB — HEMOGLOBIN A1C
Est. average glucose Bld gHb Est-mCnc: 128 mg/dL
Hgb A1c MFr Bld: 6.1 % — ABNORMAL HIGH (ref 4.8–5.6)

## 2022-07-09 LAB — URIC ACID: Uric Acid: 6.4 mg/dL (ref 3.8–8.4)

## 2022-07-09 LAB — CARDIOVASCULAR RISK ASSESSMENT

## 2022-07-09 NOTE — Assessment & Plan Note (Signed)
Start colchicine 0.6 mg twice daily x 7 days.

## 2022-07-09 NOTE — Assessment & Plan Note (Signed)
Control: good Recommend check sugars fasting daily. Recommend check feet daily. Recommend annual eye exams. Medicines: continue metformin 500 mg daily.  Continue to work on eating a healthy diet and exercise.  Labs drawn today.

## 2022-07-09 NOTE — Assessment & Plan Note (Signed)
Well controlled.  ?No changes to medicines.  ?Continue to work on eating a healthy diet and exercise.  ?Labs drawn today.  ?

## 2022-07-09 NOTE — Assessment & Plan Note (Signed)
-  Continue omeprazole 20 mg daily

## 2022-07-09 NOTE — Assessment & Plan Note (Signed)
Well controlled.  No changes to medicines. Continue lasix 20 mg once daily as needed, aspirin 81 mg daily, and crestor 20 mg once daily.   Management per specialist.

## 2022-07-09 NOTE — Assessment & Plan Note (Signed)
Management per specialist. 

## 2022-07-09 NOTE — Assessment & Plan Note (Signed)
UA normal.

## 2022-07-09 NOTE — Assessment & Plan Note (Signed)
Recommend continue to work on eating healthy diet and exercise.  

## 2022-07-11 ENCOUNTER — Ambulatory Visit: Payer: Medicare HMO | Admitting: Pulmonary Disease

## 2022-07-11 ENCOUNTER — Encounter: Payer: Self-pay | Admitting: Pulmonary Disease

## 2022-07-11 ENCOUNTER — Telehealth: Payer: Self-pay

## 2022-07-11 VITALS — BP 138/62 | HR 73 | Temp 97.7°F | Ht 66.0 in | Wt 172.4 lb

## 2022-07-11 DIAGNOSIS — U099 Post covid-19 condition, unspecified: Secondary | ICD-10-CM

## 2022-07-11 DIAGNOSIS — J849 Interstitial pulmonary disease, unspecified: Secondary | ICD-10-CM

## 2022-07-11 DIAGNOSIS — Z5181 Encounter for therapeutic drug level monitoring: Secondary | ICD-10-CM | POA: Diagnosis not present

## 2022-07-11 DIAGNOSIS — G4734 Idiopathic sleep related nonobstructive alveolar hypoventilation: Secondary | ICD-10-CM

## 2022-07-11 NOTE — Progress Notes (Signed)
Kerry Matthews    [id number]    03-Feb-1936  Primary Care Physician:Cox, Elnita Maxwell, MD  Referring Physician: [name], MD [address] [place],  [place] [address]  Chief complaint: Follow up for CTD interstitial lung disease, alpha-1 antitrypsin carrier  HPI: 86 year old with pulmonary fibrosis on CT chest. He has complains of joint stiffness, generalized myalgias, low-grade fevers starting July 2020.  The symptoms generally respond to prednisone.  Denies any renal symptoms, difficulty swallowing.  Ruled out for COVID- 19 multiple times.  He has seen Dr. Trudie Reed, Rheumatology. Serologies reviewed which show elevated p-ANCA, myeloperoxidase with elevated CRP, normal sed rate, elevated IgG4 subclass, repeat rheumatoid factor is negative. 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  Was initially on prednisone and eventually got started on Rituxan by Dr. Trudie Reed from rheumatology in late 2020 with tapering off of prednisone.  Symptoms have been stable while on Rituxan  He developed COVID-19 infection on 07/29/2021 which was treated as an outpatient with Molnupiravir.  However his situation deteriorated and was admitted to [facility] on 08/12/2021 to 08/25/2021. He was diagnosed with multifocal pneumonia, Acute Respiratory failure up to 15 L.  Treated with convalescent plasma, IV steroids which was transitioned to prednisone 60 mg.  Discharged on supplemental oxygen Started on insulin due to high blood sugars by primary care.  Unable to use Bactrim prophylaxis due to allergies  He was treated with antibiotics at last visit in January 2022 for elevated WBC count and chest x-ray with persistent bilateral opacities  Received note from Dr. Gavin Pound 02/19/2022 Follow-up for microscopic polyangiitis, elevated rheumatoid factor.  There is no mention of hyper IgE syndrome on rheumatology  assessment  Per rheumatology assessment of microscopic polyangiitis is not contributing to pulmonary symptoms and at this point he does not repeat rituximab from rheumatology standpoint.  Pets: Has a dog.  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 No mold, hot tub, Jacuzzi, down, humidifier Smoking history: Never smoker Travel history: No significant travel history Relevant family history: 2 sisters died of pulmonary fibrosis of unclear etiology.  1 of the sisters also had COPD  Interim history:  Weaned off steroids. Started [place] Gave in July 2023.  This was poorly tolerated with fatigue, depression.  He held [facility] for a few weeks and restarted but had recurrent symptoms so discontinued altogether.  Off supplemental oxygen during the daytime.  Continues on 1 L oxygen at night He feels that he can come off oxygen at night as well  Restarted on Rituxan in November 2023 by Dr. Trudie Reed from rheumatology since he could not tolerate the Ofev.  Outpatient Encounter Medications as of 07/11/2022  Medication Sig   Alcohol Swabs (B-D SINGLE USE SWABS REGULAR) PADS 1 each by Does not apply route in the morning, at noon, in the evening, and at bedtime.   aspirin EC 81 MG tablet Take 81 mg by mouth daily. Swallow whole.   Coenzyme Q10 100 MG TABS Take 100 mg by mouth at bedtime.   colchicine 0.6 MG tablet Take 1 tablet (0.6 mg total) by mouth 2 (two) times daily. With food   furosemide (LASIX) 20 MG tablet TAKE 1 TABLET EVERY DAY AS NEEDED   gabapentin (  NEURONTIN) 400 MG capsule TAKE 1 CAPSULE THREE TIMES DAILY (Patient taking differently: Take 400 mg by mouth 2 (two) times daily. Twice a day)   glucose blood (TRUE METRIX BLOOD GLUCOSE TEST) test strip TEST BLOOD SUGAR THREE TIMES DAILY  BEFORE MEALS   metFORMIN (GLUCOPHAGE) 500 MG tablet Take 1 tablet (500 mg total) by mouth daily with breakfast.   Multiple Vitamins-Minerals (PRESERVISION AREDS 2) CAPS Take 1 capsule by mouth 2 (two) times daily after a meal.   nitroGLYCERIN (NITROSTAT) 0.4 MG SL tablet Place 1 tablet (0.4 mg total) under the tongue every 5 (five) minutes as needed for chest pain.   omeprazole (PRILOSEC) 20 MG capsule TAKE 1 CAPSULE EVERY DAY   OXYGEN Inhale 1 L into the lungs daily. Using as needed during the day and continuous at night.   riTUXimab (RITUXAN) 500 MG/50ML injection '1000mg'$  Intravenous day 1 and day 15 then every 24 weeks for 14 days   rosuvastatin (CRESTOR) 20 MG tablet TAKE 1 TABLET EVERY DAY   vitamin B-12 (CYANOCOBALAMIN) 1000 MCG tablet Take 1 tablet (1,000 mcg total) by mouth daily.   No facility-administered encounter medications on file as of 07/11/2022.   Physical Exam: Blood pressure 138/62, pulse 73, temperature 97.7 F (36.5 C), temperature source Oral, height '5\' 6"'$  (1.676 m), weight 172 lb 6.4 oz (78.2 kg), SpO2 96 %. Gen:      No acute distress HEENT:  EOMI, sclera anicteric Neck:     No masses; no thyromegaly Lungs:    Clear to auscultation bilaterally; normal respiratory effort CV:         Regular rate and rhythm; no murmurs Abd:      + bowel sounds; soft, non-tender; no palpable masses, no distension Ext:    No edema; adequate peripheral perfusion Skin:      Warm and dry; no rash Neuro: alert and oriented x 3 Psych: normal mood and affect   Data Reviewed: Imaging: CT high-resolution 04/19/2019- patchy groundglass attenuation, septal thickening with reticulation and mild bronchiectasis with basal gradient.  No definitive honeycombing Probable UIP.   CT high-resolution 10/14/2019-stable findings of interstitial lung disease, pulmonary fibrosis and probable UIP pattern  CTA 08/12/2021-no pulmonary embolism, patchy multifocal airspace disease  CTA 08/18/2021-no PE,  extensive bilateral homogeneous groundglass opacities with septal thickening, fibrotic changes at the bases.  High-resolution CT 01/28/2022-pulmonary pattern of fibrosis with progressive UIP changes. I have reviewed the images personally  PFTs: 05/03/2019 FVC 3.22 [92%], FEV1 2.57 [104%], F/F 80, TLC 4.85 [95%], DLCO 18.93 [85%] Minimal restriction  Labs: 04/02/2019- p-ANCA 1:160, c-ANCA negative, MPO>100, rheumatoid factor 95 CCP negative, Sjogren's antibody negative, SCL 70- negative Jo 1-negative, aldolase 6.3, CK 31  Alpha-1 antitrypsin 04/12/2019- 124, PI MZ  Overnight oximetry 03/12/2022 Time of study was 9 hours and 4 minutes Time spent less than 88% 7 hours 4 minutes Oxygen desaturation index 14.35   Assessment:  Recent hospitalization for COVID-19, multifocal bacterial pneumonia He is making slow recovery posthospitalization.  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  He has not tolerated Ofev and is not interested in retrying antifibrotic's and would rather wait and watch.  Order follow-up high-res CT in 6 months  Has been restarted on Rituxan per Dr. Trudie Reed  Nocturnal hypoxia He would like to come off supplemental oxygen at night since he feels that he does not  need it.  We reviewed his overnight oximetry from July which showed significant desats.  He feels that he is much better since then and wants a reassessment Order overnight oximetry repeat on room air after the holidays.  Scheduled for January 2023  Alpha-1 antitrypsin carrier status His A1AT levels are normal with no obstruction on pulmonary function test.  LFTs are normal Continue monitoring.  No need for treatment.  Plan/Recommendations: Overnight oximetry Follow-up CT in 6 months  [name] MD [facility] 07/11/2022, 10:20 AM  CC: [name], MD

## 2022-07-11 NOTE — Telephone Encounter (Signed)
Created in error, disregard.

## 2022-07-11 NOTE — Patient Instructions (Signed)
Will order overnight oximetry on room air in January to reassess your oxygen levels We will hold off on reinitiating Ofev We will order high-resolution CT in 6 months Follow-up in clinic in 6 months after CT scan

## 2022-07-14 ENCOUNTER — Encounter: Payer: Self-pay | Admitting: Family Medicine

## 2022-07-15 DIAGNOSIS — M317 Microscopic polyangiitis: Secondary | ICD-10-CM | POA: Diagnosis not present

## 2022-08-13 IMAGING — CT CT ANGIO CHEST
2 of 6 series · 18 of 46 positions shown · IV contrast (omnipaque)
Comparison: 08/12/2021, 10/14/2019

CLINICAL DATA: PE suspected, positive D-dimer

EXAM:
CT ANGIOGRAPHY CHEST WITH CONTRAST
TECHNIQUE: Multidetector CT imaging of the chest was performed using the
standard protocol during bolus administration of intravenous
contrast. Multiplanar CT image reconstructions and MIPs were
obtained to evaluate the vascular anatomy.
CONTRAST:  63mL OMNIPAQUE IOHEXOL 350 MG/ML SOLN

[Series 6: thins · axial · 0.56mm/px · z∈[-311,-62]mm · 15 of 273 slices shown]
[im 12/273  lung]
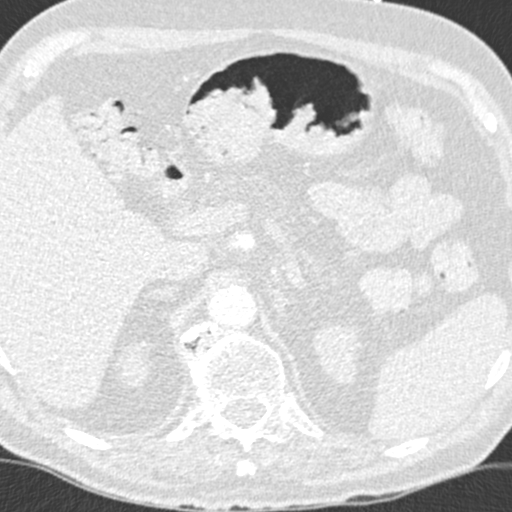
[im 36/273  soft-tissue]
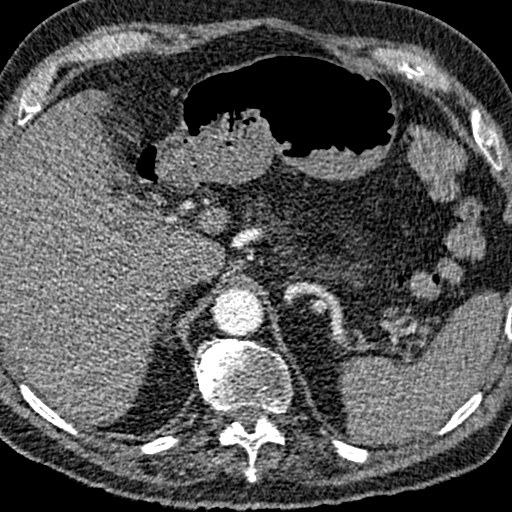
[im 48/273  lung]
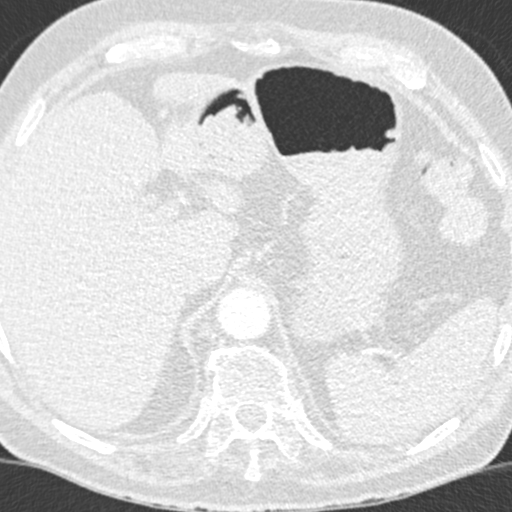
[im 71/273  soft-tissue]
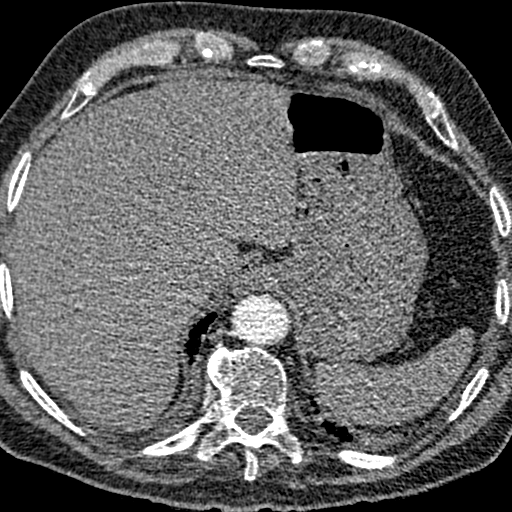
[im 83/273  lung]
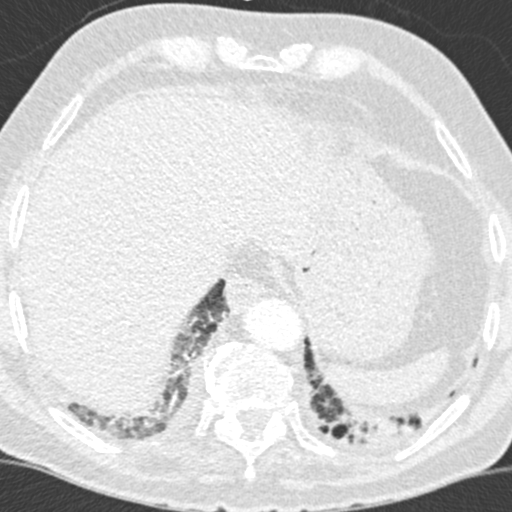
[im 107/273  soft-tissue]
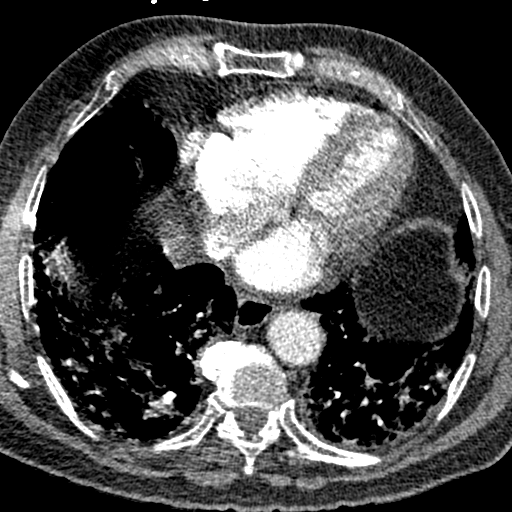
[im 119/273  lung]
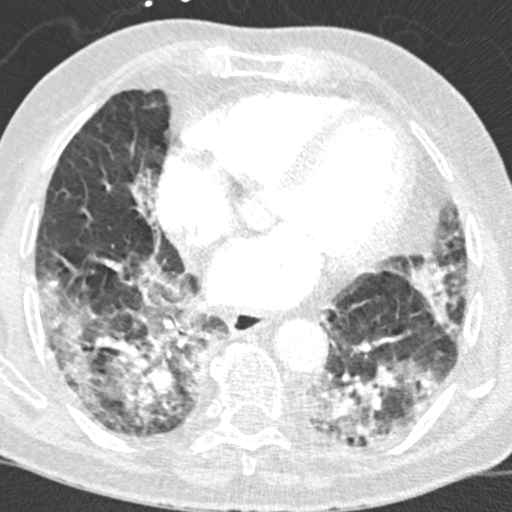
[im 142/273  soft-tissue]
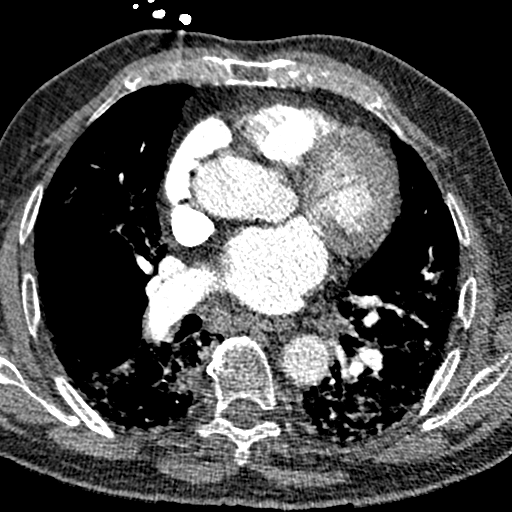
[im 154/273  lung]
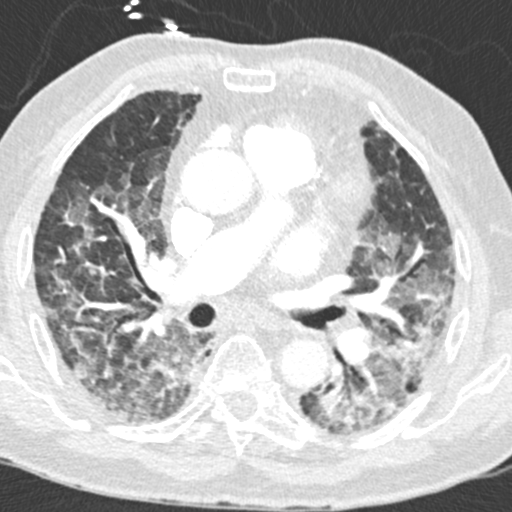
[im 166/273  soft-tissue]
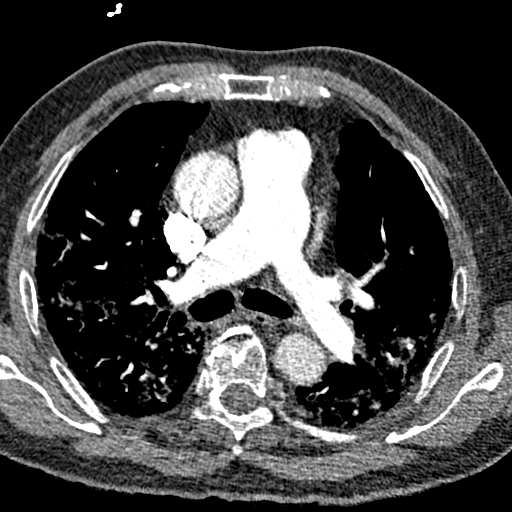
[im 190/273  lung]
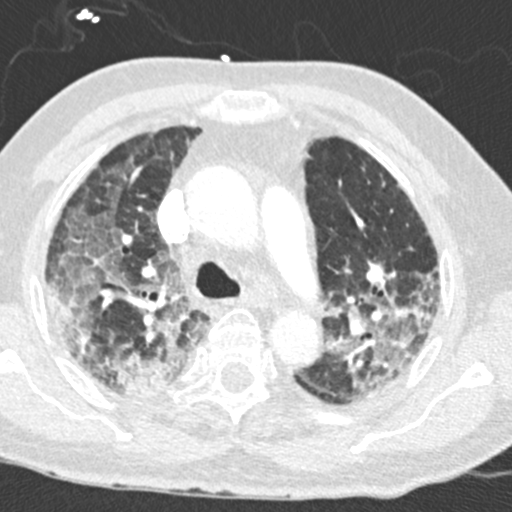
[im 202/273  soft-tissue]
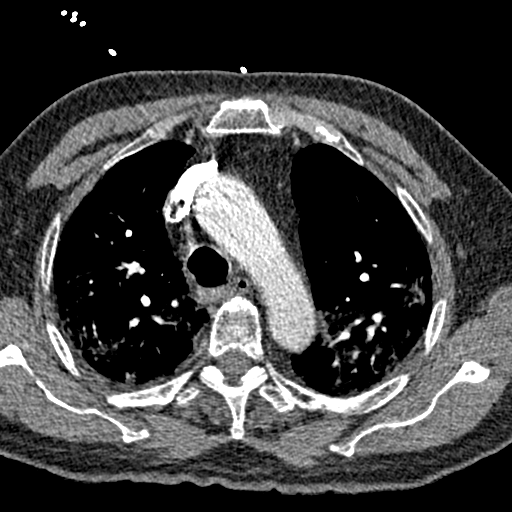
[im 225/273  lung]
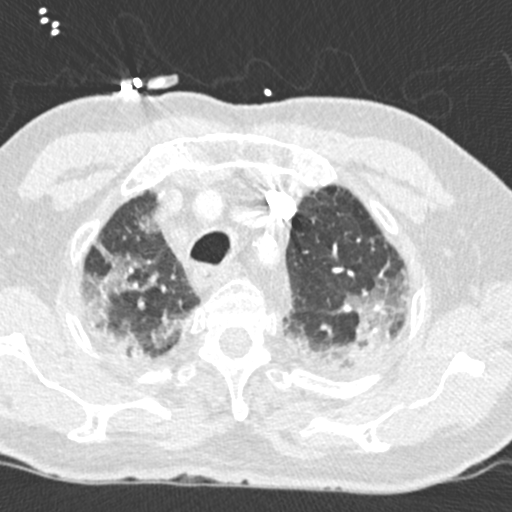
[im 237/273  soft-tissue]
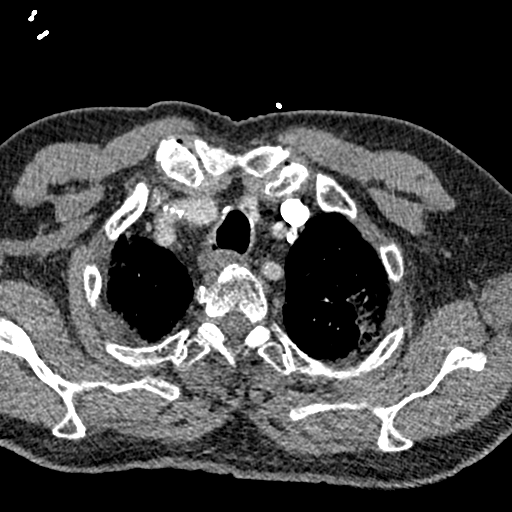
[im 261/273  lung]
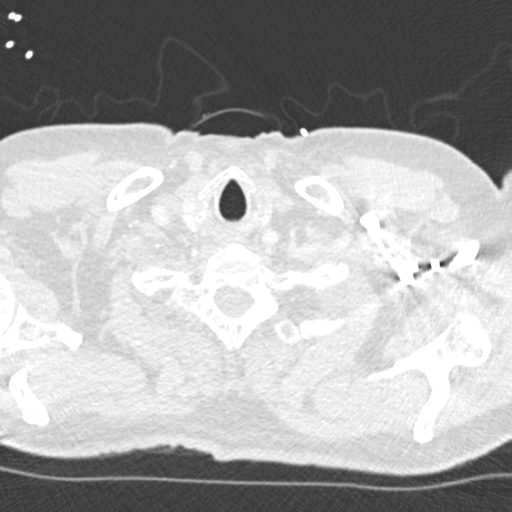

[Series 8: coronal mpr · coronal · 0.54mm/px · 3 of 151 slices shown]
[im 38/151  soft-tissue]
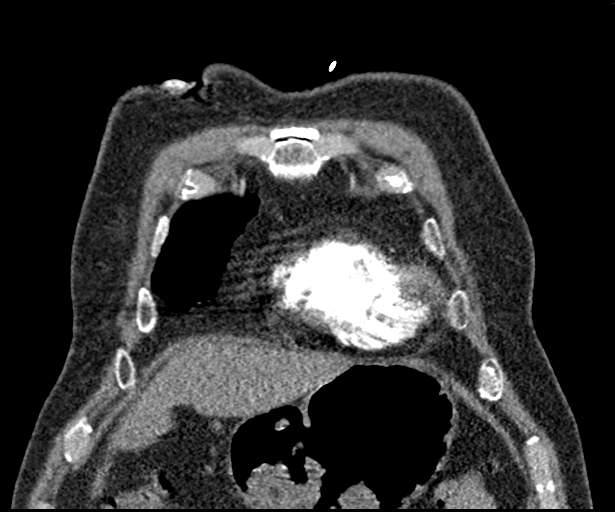
[im 76/151  soft-tissue]
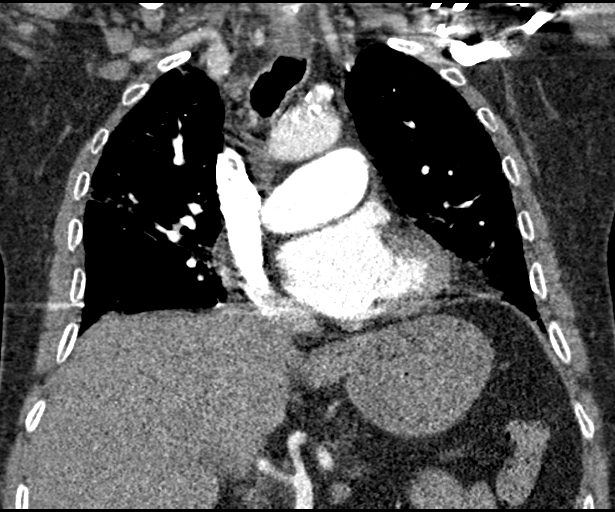
[im 113/151  soft-tissue]
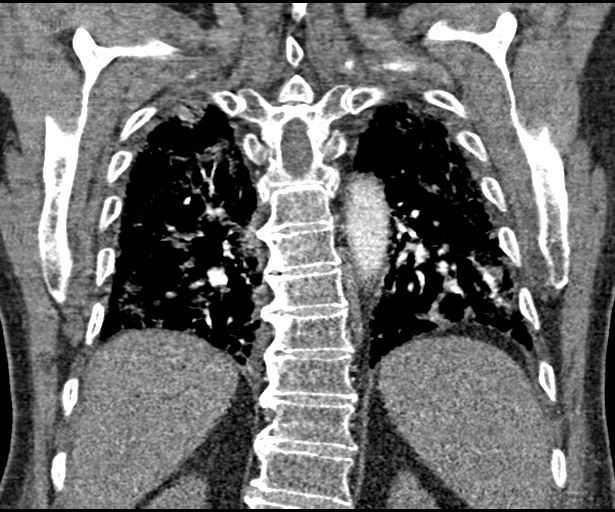

[18 of 46 positions shown; findings below may reference images not displayed]

FINDINGS: Cardiovascular: Satisfactory opacification of the pulmonary arteries
to the segmental level. No evidence of pulmonary embolism.
Cardiomegaly. Three-vessel coronary artery calcifications.
Enlargement of the main pulmonary artery, measuring up to 4.0 cm in
caliber. No pericardial effusion.

Mediastinum/Nodes: Unchanged prominent mediastinal lymph nodes
thyroid gland, trachea, and esophagus demonstrate no significant
findings.

Lungs/Pleura: There is extensive, somewhat geographic bilateral
heterogeneous and ground-glass airspace opacity with superimposed
interlobular septal thickening. Fibrotic change and subpleural
bronchiolectasis at the dependent bilateral lung bases (series 7,
image 62). Trace bilateral pleural effusions.

Upper Abdomen: No acute abnormality.

Musculoskeletal: No chest wall abnormality. No acute or significant
osseous findings.

Review of the MIP images confirms the above findings.
IMPRESSION: 1. Negative examination for pulmonary embolism.
2. Extensive, somewhat geographic bilateral heterogeneous and
ground-glass airspace opacity with superimposed interlobular septal
thickening, somewhat worsened compared to prior examination. Trace
bilateral pleural effusions. Findings are most consistent with
multifocal infection, including [id number] if clinically suspected.
3. Fibrotic change and subpleural bronchiolectasis at the dependent
bilateral lung bases, in keeping with UIP pattern of fibrosis seen
on prior CT dated 10/14/2019 although otherwise not well
characterized in the setting of acute airspace disease.
4. Cardiomegaly and coronary artery disease.
5. Enlargement of the main pulmonary artery, as can be seen in
pulmonary hypertension.

## 2022-08-27 ENCOUNTER — Other Ambulatory Visit: Payer: Self-pay | Admitting: Family Medicine

## 2022-09-03 ENCOUNTER — Other Ambulatory Visit: Payer: Self-pay | Admitting: Family Medicine

## 2022-09-03 MED ORDER — CIPROFLOXACIN HCL 250 MG PO TABS: 250.0000 mg | ORAL_TABLET | Freq: Two times a day (BID) | ORAL | 0 refills | Status: AC

## 2022-09-04 ENCOUNTER — Ambulatory Visit (INDEPENDENT_AMBULATORY_CARE_PROVIDER_SITE_OTHER): Payer: Medicare HMO | Admitting: Nurse Practitioner

## 2022-09-04 ENCOUNTER — Encounter: Payer: Self-pay | Admitting: Nurse Practitioner

## 2022-09-04 VITALS — BP 128/70 | HR 78 | Temp 97.0°F | Ht 66.0 in | Wt 174.0 lb

## 2022-09-04 DIAGNOSIS — M545 Low back pain, unspecified: Secondary | ICD-10-CM | POA: Diagnosis not present

## 2022-09-04 DIAGNOSIS — R300 Dysuria: Secondary | ICD-10-CM | POA: Diagnosis not present

## 2022-09-04 LAB — POCT URINALYSIS DIP (CLINITEK)
Bilirubin, UA: NEGATIVE
Blood, UA: NEGATIVE
Glucose, UA: NEGATIVE mg/dL
Ketones, POC UA: NEGATIVE mg/dL
Leukocytes, UA: NEGATIVE
Nitrite, UA: NEGATIVE
POC PROTEIN,UA: NEGATIVE
Spec Grav, UA: 1.02 (ref 1.010–1.025)
Urobilinogen, UA: 0.2 E.U./dL
pH, UA: 5 (ref 5.0–8.0)

## 2022-09-04 NOTE — Patient Instructions (Addendum)
Alternate heat and ice to low back Take Tylenol as directed for pain Perform back stretching exercises Apply topical rub such as Voltaren gel to lower back up to 4 times daily Follow-up as needed   Low Back Sprain or Strain Rehab Ask your health care provider which exercises are safe for you. Do exercises exactly as told by your health care provider and adjust them as directed. It is normal to feel mild stretching, pulling, tightness, or discomfort as you do these exercises. Stop right away if you feel sudden pain or your pain gets worse. Do not begin these exercises until told by your health care provider. Stretching and range-of-motion exercises These exercises warm up your muscles and joints and improve the movement and flexibility of your back. These exercises also help to relieve pain, numbness, and tingling. Lumbar rotation  Lie on your back on a firm bed or the floor with your knees bent. Straighten your arms out to your sides so each arm forms a 90-degree angle (right angle) with a side of your body. Slowly move (rotate) both of your knees to one side of your body until you feel a stretch in your lower back (lumbar). Try not to let your shoulders lift off the floor. Hold this position for __________ seconds. Tense your abdominal muscles and slowly move your knees back to the starting position. Repeat this exercise on the other side of your body. Repeat __________ times. Complete this exercise __________ times a day. Single knee to chest  Lie on your back on a firm bed or the floor with both legs straight. Bend one of your knees. Use your hands to move your knee up toward your chest until you feel a gentle stretch in your lower back and buttock. Hold your leg in this position by holding on to the front of your knee. Keep your other leg as straight as possible. Hold this position for __________ seconds. Slowly return to the starting position. Repeat with your other leg. Repeat  __________ times. Complete this exercise __________ times a day. Prone extension on elbows  Lie on your abdomen on a firm bed or the floor (prone position). Prop yourself up on your elbows. Use your arms to help lift your chest up until you feel a gentle stretch in your abdomen and your lower back. This will place some of your body weight on your elbows. If this is uncomfortable, try stacking pillows under your chest. Your hips should stay down, against the surface that you are lying on. Keep your hip and back muscles relaxed. Hold this position for __________ seconds. Slowly relax your upper body and return to the starting position. Repeat __________ times. Complete this exercise __________ times a day. Strengthening exercises These exercises build strength and endurance in your back. Endurance is the ability to use your muscles for a long time, even after they get tired. Pelvic tilt This exercise strengthens the muscles that lie deep in the abdomen. Lie on your back on a firm bed or the floor with your legs extended. Bend your knees so they are pointing toward the ceiling and your feet are flat on the floor. Tighten your lower abdominal muscles to press your lower back against the floor. This motion will tilt your pelvis so your tailbone points up toward the ceiling instead of pointing to your feet or the floor. To help with this exercise, you may place a small towel under your lower back and try to push your back into the  towel. Hold this position for __________ seconds. Let your muscles relax completely before you repeat this exercise. Repeat __________ times. Complete this exercise __________ times a day. Alternating arm and leg raises  Get on your hands and knees on a firm surface. If you are on a hard floor, you may want to use padding, such as an exercise mat, to cushion your knees. Line up your arms and legs. Your hands should be directly below your shoulders, and your knees should  be directly below your hips. Lift your left leg behind you. At the same time, raise your right arm and straighten it in front of you. Do not lift your leg higher than your hip. Do not lift your arm higher than your shoulder. Keep your abdominal and back muscles tight. Keep your hips facing the ground. Do not arch your back. Keep your balance carefully, and do not hold your breath. Hold this position for __________ seconds. Slowly return to the starting position. Repeat with your right leg and your left arm. Repeat __________ times. Complete this exercise __________ times a day. Abdominal set with straight leg raise  Lie on your back on a firm bed or the floor. Bend one of your knees and keep your other leg straight. Tense your abdominal muscles and lift your straight leg up, 4-6 inches (10-15 cm) off the ground. Keep your abdominal muscles tight and hold this position for __________ seconds. Do not hold your breath. Do not arch your back. Keep it flat against the ground. Keep your abdominal muscles tense as you slowly lower your leg back to the starting position. Repeat with your other leg. Repeat __________ times. Complete this exercise __________ times a day. Single leg lower with bent knees Lie on your back on a firm bed or the floor. Tense your abdominal muscles and lift your feet off the floor, one foot at a time, so your knees and hips are bent in 90-degree angles (right angles). Your knees should be over your hips and your lower legs should be parallel to the floor. Keeping your abdominal muscles tense and your knee bent, slowly lower one of your legs so your toe touches the ground. Lift your leg back up to return to the starting position. Do not hold your breath. Do not let your back arch. Keep your back flat against the ground. Repeat with your other leg. Repeat __________ times. Complete this exercise __________ times a day. Posture and body mechanics Good posture and  healthy body mechanics can help to relieve stress in your body's tissues and joints. Body mechanics refers to the movements and positions of your body while you do your daily activities. Posture is part of body mechanics. Good posture means: Your spine is in its natural S-curve position (neutral). Your shoulders are pulled back slightly. Your head is not tipped forward (neutral). Follow these guidelines to improve your posture and body mechanics in your everyday activities. Standing  When standing, keep your spine neutral and your feet about hip-width apart. Keep a slight bend in your knees. Your ears, shoulders, and hips should line up. When you do a task in which you stand in one place for a long time, place one foot up on a stable object that is 2-4 inches (5-10 cm) high, such as a footstool. This helps keep your spine neutral. Sitting  When sitting, keep your spine neutral and keep your feet flat on the floor. Use a footrest, if necessary, and keep your thighs parallel to  the floor. Avoid rounding your shoulders, and avoid tilting your head forward. When working at a desk or a computer, keep your desk at a height where your hands are slightly lower than your elbows. Slide your chair under your desk so you are close enough to maintain good posture. When working at a computer, place your monitor at a height where you are looking straight ahead and you do not have to tilt your head forward or downward to look at the screen. Resting When lying down and resting, avoid positions that are most painful for you. If you have pain with activities such as sitting, bending, stooping, or squatting, lie in a position in which your body does not bend very much. For example, avoid curling up on your side with your arms and knees near your chest (fetal position). If you have pain with activities such as standing for a long time or reaching with your arms, lie with your spine in a neutral position and bend your  knees slightly. Try the following positions: Lying on your side with a pillow between your knees. Lying on your back with a pillow under your knees. Lifting  When lifting objects, keep your feet at least shoulder-width apart and tighten your abdominal muscles. Bend your knees and hips and keep your spine neutral. It is important to lift using the strength of your legs, not your back. Do not lock your knees straight out. Always ask for help to lift heavy or awkward objects. This information is not intended to replace advice given to you by your health care provider. Make sure you discuss any questions you have with your health care provider. Document Revised: 10/29/2020 Document Reviewed: 10/29/2020 [employer] Patient Education  [place]    Strain A lumbar strain, which is sometimes called a low-back strain, is a stretch or tear in a muscle or tendons in the lower back (lumbar spine). Tendons are the strong cords of tissue that attach muscle to bone. This type of injury occurs when muscles or tendons are torn or are stretched beyond their limits. Lumbar strains can range from mild to severe. Mild strains may involve stretching a muscle or tendon without tearing it. These may heal in 1-2 weeks. More severe strains involve tearing of muscle fibers or tendons. These will cause more pain and may take 6-8 weeks to heal. What are the causes? This condition may be caused by: Trauma, such as a fall or a hit to the body. Twisting or overstretching the back. This may result from doing activities that take a lot of energy, such as lifting heavy objects. What increases the risk? A lumbar strain is more common in: Athletes. People with obesity. People who do repeated lifting, bending, or other movements that involve their back. What are the signs or symptoms? Symptoms of this condition may include: Sharp or dull pain in the lower back that does not go away. The pain may spread to the  buttocks. Stiffness or limited range of motion. Sudden muscle tightening (spasms). How is this diagnosed? This condition may be diagnosed based on: Your symptoms. Your medical history. A physical exam. Imaging tests, such as: X-rays. MRI. How is this treated? Treatment for this condition may include: Rest. Applying heat and cold to the affected area. Over-the-counter medicines to help with pain and inflammation, such as NSAIDs. Prescription medicine for pain or to relax the muscles. These may be needed for a short time. Physical therapy exercises to improve movement and strength. Follow  these instructions at home: Managing pain, stiffness, and swelling     If told, put ice on the injured area during the first 24 hours after your injury. Put ice in a plastic bag. Place a towel between your skin and the bag. Leave the ice on for 20 minutes, 2-3 times a day. If told, apply heat to the affected area as often as told by your health care provider. Use the heat source that your health care provider recommends, such as a moist heat pack or a heating pad. Place a towel between your skin and the heat source. Leave the heat on for 20-30 minutes. If your skin turns bright red, remove the ice or heat right away to prevent skin damage. The risk of damage is higher if you cannot feel pain, heat, or cold. Activity Rest and return to your normal activities as told by your health care provider. Ask your health care provider what activities are safe for you. Do exercises as told by your health care provider. General instructions Take over-the-counter and prescription medicines only as told by your health care provider. Ask your health care provider if the medicine prescribed to you: Requires you to avoid driving or using machinery. Can cause constipation. You may need to take these actions to prevent or treat constipation: Drink enough fluid to keep your urine pale yellow. Take over-the-counter  or prescription medicines. Eat foods that are high in fiber, such as beans, whole grains, and fresh fruits and vegetables. Limit foods that are high in fat and processed sugars, such as fried or sweet foods. Do not use any products that contain nicotine or tobacco. These products include cigarettes, chewing tobacco, and vaping devices, such as e-cigarettes. If you need help quitting, ask your health care provider. How is this prevented? To prevent a future low-back injury: Always warm up properly before physical activity or sports. Cool down and stretch after being active. Use correct form when playing sports and lifting heavy objects. Bend your knees before you lift heavy objects. Use good posture when sitting and standing. Stay physically fit and keep a healthy weight. Do at least 150 minutes of moderate intensity exercise each week, such as brisk walking or water aerobics. Do strength exercises at least 2 times each week. Contact a health care provider if: Your back pain does not improve after several weeks of treatment. Your symptoms get worse. You have a fever. Get help right away if: Your back pain is severe. You are unable to stand or walk. You develop pain in your legs. You have weakness in your buttocks or legs. You have trouble controlling when you urinate or when you have a bowel movement. You have frequent, painful, or bloody urination. This information is not intended to replace advice given to you by your health care provider. Make sure you discuss any questions you have with your health care provider. Document Revised: 03/04/2022 Document Reviewed: 03/04/2022 [employer] Patient Education  [place].

## 2022-09-04 NOTE — Progress Notes (Signed)
Acute Office Visit  Subjective:    Patient ID: Kerry Matthews, male    DOB: 1936/04/15, 87 y.o.   MRN: [id number]   CC: Abd pain  HPI: Patient is in today for Urinary symptoms  He reports  lower back pain. The current episode started a few days ago and is staying constant. Patient states symptoms are moderate in intensity, occurring intermittently. He  has a history of RA and prior back pain. Pt was prescribed Cipro yesterda by PCP yesterday due to concern for UTI. UA normal at today's appt..Denies nausea, fever, decreased appetite or changes in stool or pattern   Back Pain  He reports recurrent back pain. There was not an injury that may have caused the pain. The most recent episode started a few days ago and is gradually improving. The pain is located in the across the lower backwithout radiation. It is described as "it just hurts", is 4/10 in intensity, occurring constantly. Symptoms are worse in the: "all the time"  Aggravating factors: bending backwards, bending sideways, increased intrathoracic pressure (cough, sneeze, etc.), sitting, standing, and walking Relieving factors: none.  He has tried acetaminophen with little relief.     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  ANCA antibodies- myeloperoxidase ABS-greater than 100, ANCA proteinase 3-less than 3.5 04/02/2019- ANCA titers- p-ANCA +1: 160, C ANCA-less than 1: 20, atypical p-ANCA titer-less than 1: 20   Abnormal CT of the chest    Abnormal findings on diagnostic imaging of lung 04/12/2019   04/01/2019-CT chest with contrast- no acute process in chest abdomen or pelvis, interstitial lung disease suspicious for early or mild UIP, pulmonary artery enlargement suggest PAH    Acute pain of left knee 11/30/2019   Alpha-1-antitrypsin deficiency carrier 05/04/2019   Atherosclerotic heart disease of native coronary artery without angina pectoris 05/03/2018   Body mass index (BMI)  29.0-29.9, adult 01/05/2020   Bradycardia    Chronic kidney disease, stage 3a ([place]) 08/12/2021   CKD (chronic kidney disease) 05/03/2018   Coronary artery disease    Coronary artery disease involving native coronary artery of native heart without angina pectoris 05/03/2018   Diabetes mellitus type 2 in obese ([place]) 04/01/2019   Diabetic polyneuropathy associated with type 2 diabetes mellitus ([place]) 08/12/2021   Dyslipidemia associated with type 2 diabetes mellitus ([place]) 05/03/2018   Elevated rheumatoid factor 04/12/2019   04/03/2019-rheumatoid factor-95.4   Essential hypertension 05/03/2018   GAD (generalized anxiety disorder)    GERD without esophagitis 05/03/2018   Hematuria 09/07/2020   History of kidney stones    Hypertensive heart disease without heart failure 05/03/2018   Hyponatremia 08/12/2021   Idiopathic pulmonary fibrosis ([place]) 03/29/2021   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   Insomnia 05/03/2018   Interstitial pulmonary disease ([place]) 04/12/2019   04/01/2019-CT chest with contrast- no acute process in chest abdomen or pelvis, interstitial lung disease suspicious for early or mild UIP, pulmonary artery enlargement suggest PAH  04/02/2019-connective tissue work-up: Anti-Jo 1-negative Anti-DNA antibody double-stranded-negative Anti-scleroderma antibody-negative Sjogren's syndrome antibody-negative Sjogren's syndrome antibody-negative CK-31 CCP-6 E   Leukocytosis 03/08/2020   Low vitamin B12 level 04/03/2019   Lower extremity edema    Medicare annual wellness visit, subsequent 08/09/2020   Microscopic polyangiitis ([place]) 03/29/2021   Mixed diabetic hyperlipidemia associated with type 2 diabetes mellitus ([place]) 08/12/2021   Mixed hyperlipidemia 05/03/2018   Myalgia 04/19/2019   Osteoarthritis    Other emphysema ([place])    Other long term (  current) drug therapy 03/29/2021   Peripheral polyneuropathy 01/30/2020   Polymyositis ([place])    Primary insomnia    Proteinuria 09/07/2020   Renal  insufficiency 09/06/2020   Respiratory disorder concurrent with and due to microscopic polyangiitis ([place]) 08/12/2021   Rheumatoid factor positive 03/29/2021   Sepsis ([place]) 04/19/2019   Skin cancer    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/17/2020   Transaminitis    Trigger finger 05/03/2018   Type 2 diabetes mellitus with hyperglycemia, with long-term current use of insulin ([place]) 04/23/2021   UC (ulcerative colitis) ([place])    UTI (urinary tract infection)    Vasculitis ([place]) 03/29/2021    Past Surgical History:  Procedure Laterality Date   ANGIOPLASTY  2010   BACK SURGERY     CATARACT EXTRACTION     COLONOSCOPY  06/16/2005   Mild colitis involving splenic flexure. Colonic polyps, status post polypectomy. Mild pancolonic diverticulitits. Internal hemorrhoids.    ESOPHAGOGASTRODUODENOSCOPY  04/26/2003   Irregular Z line suggestive of GERD. Mild gastritis status post CLO testing.    TOTAL KNEE ARTHROPLASTY Left 12/17/2020   Procedure: LEFT TOTAL KNEE ARTHROPLASTY;  Surgeon: Leandrew Koyanagi, MD;  Location: [facility];  Service: Orthopedics;  Laterality: Left;   TRIGGER FINGER RELEASE      Family History  Problem Relation Age of Onset   Tuberculosis Mother    Stroke Father    Pancreatic cancer Sister    Heart attack Sister    Lung disease Sister    Clotting disorder Brother    Colon cancer Neg Hx    Esophageal cancer Neg Hx     Social History   Socioeconomic History   Marital status: Married    Spouse name: Not on file   Number of children: 2   Years of education: Not on file   Highest education level: Not on file  Occupational History   Occupation: retired  Tobacco Use   Smoking status: Never   Smokeless tobacco: Never  Vaping Use   Vaping Use: Never used  Substance and Sexual Activity   Alcohol use: Never   Drug use: Never   Sexual activity: Not on file  Other Topics Concern   Not on file  Social History Narrative   Not on file   Social Determinants of Health    Financial Resource Strain: Low Risk  (05/07/2022)   Overall Financial Resource Strain (CARDIA)    Difficulty of Paying Living Expenses: Not hard at all  Food Insecurity: No Food Insecurity (04/08/2022)   Hunger Vital Sign    Worried About Running Out of Food in the Last Year: Never true    Ran Out of Food in the Last Year: Never true  Transportation Needs: No Transportation Needs (05/07/2022)   PRAPARE - Hydrologist (Medical): No    Lack of Transportation (Non-Medical): No  Physical Activity: Sufficiently Active (06/07/2020)   Exercise Vital Sign    Days of Exercise per Week: 5 days    Minutes of Exercise per Session: 30 min  Stress: Not on file  Social Connections: Not on file  Intimate Partner Violence: Not At Risk (04/08/2022)   Humiliation, Afraid, Rape, and Kick questionnaire    Fear of Current or Ex-Partner: No    Emotionally Abused: No    Physically Abused: No    Sexually Abused: No    Outpatient Medications Prior to Visit  Medication Sig Dispense Refill   Alcohol Swabs (B-D SINGLE USE SWABS REGULAR)  PADS 1 each by Does not apply route in the morning, at noon, in the evening, and at bedtime. 300 each 3   aspirin EC 81 MG tablet Take 81 mg by mouth daily. Swallow whole.     ciprofloxacin (CIPRO) 250 MG tablet Take 1 tablet (250 mg total) by mouth 2 (two) times daily for 5 days. 10 tablet 0   Coenzyme Q10 100 MG TABS Take 100 mg by mouth at bedtime.     colchicine 0.6 MG tablet Take 1 tablet (0.6 mg total) by mouth 2 (two) times daily. With food 14 tablet 1   furosemide (LASIX) 20 MG tablet TAKE 1 TABLET EVERY DAY AS NEEDED 90 tablet 1   gabapentin (NEURONTIN) 400 MG capsule TAKE 1 CAPSULE THREE TIMES DAILY (Patient taking differently: Take 400 mg by mouth 2 (two) times daily. Twice a day) 270 capsule 0   glucose blood (TRUE METRIX BLOOD GLUCOSE TEST) test strip TEST BLOOD SUGAR THREE TIMES DAILY BEFORE MEALS 300 strip 3   metFORMIN (GLUCOPHAGE)  500 MG tablet Take 1 tablet by mouth once daily with breakfast 90 tablet 0   Multiple Vitamins-Minerals (PRESERVISION AREDS 2) CAPS Take 1 capsule by mouth 2 (two) times daily after a meal.     nitroGLYCERIN (NITROSTAT) 0.4 MG SL tablet Place 1 tablet (0.4 mg total) under the tongue every 5 (five) minutes as needed for chest pain. 25 tablet 9   omeprazole (PRILOSEC) 20 MG capsule TAKE 1 CAPSULE EVERY DAY 90 capsule 1   OXYGEN Inhale 1 L into the lungs daily. Using as needed during the day and continuous at night.     riTUXimab (RITUXAN) 500 MG/50ML injection '1000mg'$  Intravenous day 1 and day 15 then every 24 weeks for 14 days     rosuvastatin (CRESTOR) 20 MG tablet TAKE 1 TABLET EVERY DAY 90 tablet 1   vitamin B-12 (CYANOCOBALAMIN) 1000 MCG tablet Take 1 tablet (1,000 mcg total) by mouth daily. 30 tablet 0   No facility-administered medications prior to visit.    Allergies  Allergen Reactions   Ace Inhibitors Other (See Comments)    = Slow heart rate   Hydrocodone Nausea And Vomiting   Hydrocodone-Acetaminophen Nausea And Vomiting   Nsaids Other (See Comments)    Kidney issues   Sulfamethoxazole Nausea And Vomiting   Sulfamethoxazole-Trimethoprim Nausea And Vomiting   Trimethoprim Other (See Comments)    Reaction not recalled    Review of Systems  Musculoskeletal:  Positive for back pain (lower back).  Skin:        itching      Objective:    Physical Exam Vitals reviewed.  Constitutional:      Appearance: Normal appearance.  Cardiovascular:     Rate and Rhythm: Normal rate.     Pulses: Normal pulses.     Heart sounds: Normal heart sounds.  Pulmonary:     Effort: Pulmonary effort is normal.     Breath sounds: Normal breath sounds.  Musculoskeletal:     Cervical back: Normal.     Thoracic back: Normal.     Lumbar back: Tenderness present. No spasms. Decreased range of motion.  Skin:    General: Skin is warm.  Neurological:     Mental Status: He is alert.     BP  128/70   Pulse 78   Temp (!) 97 F (36.1 C)   Ht '5\' 6"'$  (1.676 m)   Wt 174 lb (78.9 kg)   SpO2 98%   BMI  28.08 kg/m   Wt Readings from Last 3 Encounters:  07/11/22 172 lb 6.4 oz (78.2 kg)  07/08/22 171 lb 3.2 oz (77.7 kg)  03/31/22 176 lb (79.8 kg)    Health Maintenance Due  Topic Date Due   DTaP/Tdap/Td (1 - Tdap) Never done   Zoster Vaccines- Shingrix (1 of 2) Never done   COVID-19 Vaccine (5 - 2023-24 season) 07/24/2022       Lab Results  Component Value Date   TSH 1.320 03/14/2022   Lab Results  Component Value Date   WBC 15.5 (H) 07/08/2022   HGB 14.9 07/08/2022   HCT 45.6 07/08/2022   MCV 88 07/08/2022   PLT 300 07/08/2022   Lab Results  Component Value Date   NA 138 07/08/2022   K 4.9 07/08/2022   CO2 24 07/08/2022   GLUCOSE 115 (H) 07/08/2022   BUN 15 07/08/2022   CREATININE 1.19 07/08/2022   BILITOT 1.0 07/08/2022   ALKPHOS 134 (H) 07/08/2022   AST 18 07/08/2022   ALT 9 07/08/2022   PROT 6.7 07/08/2022   ALBUMIN 4.1 07/08/2022   CALCIUM 9.4 07/08/2022   ANIONGAP 5 08/23/2021   EGFR 60 07/08/2022   GFR 40.27 (L) 02/19/2022   Lab Results  Component Value Date   CHOL 160 07/08/2022   Lab Results  Component Value Date   HDL 42 07/08/2022   Lab Results  Component Value Date   LDLCALC 94 07/08/2022   Lab Results  Component Value Date   TRIG 136 07/08/2022   Lab Results  Component Value Date   CHOLHDL 3.8 07/08/2022   Lab Results  Component Value Date   HGBA1C 6.1 (H) 07/08/2022       Assessment & Plan:   1. Dysuria - POCT URINALYSIS DIP (CLINITEK)  2. Lumbar pain   -Tylenol as directed -Alternate heat/ice to low back -Perform back exercises -Apply topical rub to lower back    Alternate heat and ice to low back Take Tylenol as directed for pain Perform back stretching exercises Apply topical rub such as Voltaren gel to lower back up to 4 times daily Follow-up as needed   Follow-up: PRN  An After Visit Summary was  printed and given to the patient.  I, Rip Harbour, NP, have reviewed all documentation for this visit. The documentation on 09/04/22 for the exam, diagnosis, procedures, and orders are all accurate and complete.    Signed, Rip Harbour, NP [place] [phone]

## 2022-09-09 ENCOUNTER — Telehealth: Payer: Self-pay | Admitting: Pulmonary Disease

## 2022-09-09 DIAGNOSIS — G4734 Idiopathic sleep related nonobstructive alveolar hypoventilation: Secondary | ICD-10-CM

## 2022-09-09 NOTE — Telephone Encounter (Signed)
Patient called to inform the nurse and doctor that he still has not been scheduled for the ONO test.  Please call patient to give him an update.  CB# [phone]

## 2022-09-09 NOTE — Telephone Encounter (Signed)
Called and spoke with [facility] from [place] and they were needing a new ONO order. Placed new order that states on room air and that it needs to be done January 2024.  Called wife and gave her the update. She voiced understanding. Nothing further needed

## 2022-09-11 ENCOUNTER — Inpatient Hospital Stay (HOSPITAL_BASED_OUTPATIENT_CLINIC_OR_DEPARTMENT_OTHER)
Admission: EM | Admit: 2022-09-11 | Discharge: 2022-09-15 | DRG: 439 | Disposition: A | Discharge status: Home / Self Care | Payer: Medicare HMO | Attending: Internal Medicine | Admitting: Internal Medicine

## 2022-09-11 ENCOUNTER — Encounter (HOSPITAL_COMMUNITY): Payer: Self-pay

## 2022-09-11 ENCOUNTER — Telehealth: Payer: Self-pay | Admitting: Family Medicine

## 2022-09-11 ENCOUNTER — Ambulatory Visit: Payer: Medicare HMO | Admitting: Family Medicine

## 2022-09-11 ENCOUNTER — Other Ambulatory Visit: Payer: Self-pay

## 2022-09-11 ENCOUNTER — Emergency Department (HOSPITAL_BASED_OUTPATIENT_CLINIC_OR_DEPARTMENT_OTHER): Payer: Medicare HMO

## 2022-09-11 ENCOUNTER — Encounter (HOSPITAL_BASED_OUTPATIENT_CLINIC_OR_DEPARTMENT_OTHER): Payer: Self-pay | Admitting: Emergency Medicine

## 2022-09-11 DIAGNOSIS — J438 Other emphysema: Secondary | ICD-10-CM | POA: Diagnosis not present

## 2022-09-11 DIAGNOSIS — R6510 Systemic inflammatory response syndrome (SIRS) of non-infectious origin without acute organ dysfunction: Secondary | ICD-10-CM | POA: Diagnosis present

## 2022-09-11 DIAGNOSIS — A419 Sepsis, unspecified organism: Secondary | ICD-10-CM

## 2022-09-11 DIAGNOSIS — Z7984 Long term (current) use of oral hypoglycemic drugs: Secondary | ICD-10-CM

## 2022-09-11 DIAGNOSIS — J9601 Acute respiratory failure with hypoxia: Secondary | ICD-10-CM | POA: Diagnosis not present

## 2022-09-11 DIAGNOSIS — E1122 Type 2 diabetes mellitus with diabetic chronic kidney disease: Secondary | ICD-10-CM | POA: Diagnosis not present

## 2022-09-11 DIAGNOSIS — M3320 Polymyositis, organ involvement unspecified: Secondary | ICD-10-CM | POA: Diagnosis not present

## 2022-09-11 DIAGNOSIS — Z8249 Family history of ischemic heart disease and other diseases of the circulatory system: Secondary | ICD-10-CM

## 2022-09-11 DIAGNOSIS — M317 Microscopic polyangiitis: Secondary | ICD-10-CM | POA: Diagnosis not present

## 2022-09-11 DIAGNOSIS — Z85828 Personal history of other malignant neoplasm of skin: Secondary | ICD-10-CM

## 2022-09-11 DIAGNOSIS — I2510 Atherosclerotic heart disease of native coronary artery without angina pectoris: Secondary | ICD-10-CM | POA: Diagnosis present

## 2022-09-11 DIAGNOSIS — K5730 Diverticulosis of large intestine without perforation or abscess without bleeding: Secondary | ICD-10-CM | POA: Diagnosis not present

## 2022-09-11 DIAGNOSIS — Z886 Allergy status to analgesic agent status: Secondary | ICD-10-CM

## 2022-09-11 DIAGNOSIS — J84112 Idiopathic pulmonary fibrosis: Secondary | ICD-10-CM | POA: Diagnosis present

## 2022-09-11 DIAGNOSIS — I10 Essential (primary) hypertension: Secondary | ICD-10-CM | POA: Diagnosis not present

## 2022-09-11 DIAGNOSIS — R0902 Hypoxemia: Secondary | ICD-10-CM | POA: Diagnosis present

## 2022-09-11 DIAGNOSIS — I444 Left anterior fascicular block: Secondary | ICD-10-CM | POA: Diagnosis present

## 2022-09-11 DIAGNOSIS — R079 Chest pain, unspecified: Secondary | ICD-10-CM | POA: Diagnosis not present

## 2022-09-11 DIAGNOSIS — N1831 Chronic kidney disease, stage 3a: Secondary | ICD-10-CM | POA: Diagnosis present

## 2022-09-11 DIAGNOSIS — K851 Biliary acute pancreatitis without necrosis or infection: Secondary | ICD-10-CM | POA: Diagnosis not present

## 2022-09-11 DIAGNOSIS — R0789 Other chest pain: Secondary | ICD-10-CM | POA: Diagnosis not present

## 2022-09-11 DIAGNOSIS — E1169 Type 2 diabetes mellitus with other specified complication: Secondary | ICD-10-CM | POA: Diagnosis present

## 2022-09-11 DIAGNOSIS — K8050 Calculus of bile duct without cholangitis or cholecystitis without obstruction: Principal | ICD-10-CM | POA: Diagnosis present

## 2022-09-11 DIAGNOSIS — R17 Unspecified jaundice: Secondary | ICD-10-CM | POA: Diagnosis not present

## 2022-09-11 DIAGNOSIS — Z1152 Encounter for screening for COVID-19: Secondary | ICD-10-CM

## 2022-09-11 DIAGNOSIS — J449 Chronic obstructive pulmonary disease, unspecified: Secondary | ICD-10-CM | POA: Diagnosis not present

## 2022-09-11 DIAGNOSIS — M1990 Unspecified osteoarthritis, unspecified site: Secondary | ICD-10-CM | POA: Diagnosis present

## 2022-09-11 DIAGNOSIS — R42 Dizziness and giddiness: Secondary | ICD-10-CM | POA: Diagnosis not present

## 2022-09-11 DIAGNOSIS — J8410 Pulmonary fibrosis, unspecified: Secondary | ICD-10-CM | POA: Diagnosis not present

## 2022-09-11 DIAGNOSIS — K4090 Unilateral inguinal hernia, without obstruction or gangrene, not specified as recurrent: Secondary | ICD-10-CM | POA: Diagnosis not present

## 2022-09-11 DIAGNOSIS — Z831 Family history of other infectious and parasitic diseases: Secondary | ICD-10-CM

## 2022-09-11 DIAGNOSIS — K219 Gastro-esophageal reflux disease without esophagitis: Secondary | ICD-10-CM | POA: Diagnosis present

## 2022-09-11 DIAGNOSIS — K8020 Calculus of gallbladder without cholecystitis without obstruction: Secondary | ICD-10-CM | POA: Diagnosis not present

## 2022-09-11 DIAGNOSIS — Z7982 Long term (current) use of aspirin: Secondary | ICD-10-CM

## 2022-09-11 DIAGNOSIS — E782 Mixed hyperlipidemia: Secondary | ICD-10-CM | POA: Diagnosis present

## 2022-09-11 DIAGNOSIS — Z823 Family history of stroke: Secondary | ICD-10-CM

## 2022-09-11 DIAGNOSIS — R1013 Epigastric pain: Secondary | ICD-10-CM | POA: Diagnosis present

## 2022-09-11 DIAGNOSIS — E785 Hyperlipidemia, unspecified: Secondary | ICD-10-CM | POA: Diagnosis not present

## 2022-09-11 DIAGNOSIS — Z888 Allergy status to other drugs, medicaments and biological substances status: Secondary | ICD-10-CM

## 2022-09-11 DIAGNOSIS — E1142 Type 2 diabetes mellitus with diabetic polyneuropathy: Secondary | ICD-10-CM | POA: Diagnosis present

## 2022-09-11 DIAGNOSIS — I119 Hypertensive heart disease without heart failure: Secondary | ICD-10-CM | POA: Diagnosis not present

## 2022-09-11 DIAGNOSIS — Z79899 Other long term (current) drug therapy: Secondary | ICD-10-CM

## 2022-09-11 DIAGNOSIS — K838 Other specified diseases of biliary tract: Secondary | ICD-10-CM | POA: Diagnosis not present

## 2022-09-11 DIAGNOSIS — Z96652 Presence of left artificial knee joint: Secondary | ICD-10-CM | POA: Diagnosis present

## 2022-09-11 DIAGNOSIS — Z800 Family history of malignant neoplasm of digestive organs: Secondary | ICD-10-CM

## 2022-09-11 DIAGNOSIS — K8070 Calculus of gallbladder and bile duct without cholecystitis without obstruction: Secondary | ICD-10-CM | POA: Diagnosis not present

## 2022-09-11 DIAGNOSIS — R6520 Severe sepsis without septic shock: Secondary | ICD-10-CM | POA: Diagnosis not present

## 2022-09-11 DIAGNOSIS — E1165 Type 2 diabetes mellitus with hyperglycemia: Secondary | ICD-10-CM | POA: Diagnosis not present

## 2022-09-11 DIAGNOSIS — Z9981 Dependence on supplemental oxygen: Secondary | ICD-10-CM

## 2022-09-11 DIAGNOSIS — J849 Interstitial pulmonary disease, unspecified: Secondary | ICD-10-CM | POA: Diagnosis present

## 2022-09-11 DIAGNOSIS — Z794 Long term (current) use of insulin: Secondary | ICD-10-CM | POA: Diagnosis not present

## 2022-09-11 DIAGNOSIS — I1310 Hypertensive heart and chronic kidney disease without heart failure, with stage 1 through stage 4 chronic kidney disease, or unspecified chronic kidney disease: Secondary | ICD-10-CM | POA: Diagnosis present

## 2022-09-11 DIAGNOSIS — R110 Nausea: Secondary | ICD-10-CM | POA: Diagnosis not present

## 2022-09-11 DIAGNOSIS — Z885 Allergy status to narcotic agent status: Secondary | ICD-10-CM

## 2022-09-11 DIAGNOSIS — Z882 Allergy status to sulfonamides status: Secondary | ICD-10-CM

## 2022-09-11 DIAGNOSIS — Z832 Family history of diseases of the blood and blood-forming organs and certain disorders involving the immune mechanism: Secondary | ICD-10-CM

## 2022-09-11 HISTORY — DX: Calculus of bile duct without cholangitis or cholecystitis without obstruction: K80.50

## 2022-09-11 HISTORY — DX: Systemic inflammatory response syndrome (sirs) of non-infectious origin without acute organ dysfunction: R65.10

## 2022-09-11 LAB — TROPONIN I (HIGH SENSITIVITY)
Troponin I (High Sensitivity): 7 ng/L (ref <18)
Troponin I (High Sensitivity): 13 ng/L (ref <18)

## 2022-09-11 LAB — COMPREHENSIVE METABOLIC PANEL
ALT: 154 U/L — ABNORMAL HIGH (ref 0–44)
AST: 163 U/L — ABNORMAL HIGH (ref 15–41)
Albumin: 3.1 g/dL — ABNORMAL LOW (ref 3.5–5.0)
Alkaline Phosphatase: 381 U/L — ABNORMAL HIGH (ref 38–126)
Anion gap: 10 (ref 5–15)
BUN: 20 mg/dL (ref 8–23)
CO2: 22 mmol/L (ref 22–32)
Calcium: 8.2 mg/dL — ABNORMAL LOW (ref 8.9–10.3)
Chloride: 100 mmol/L (ref 98–111)
Creatinine, Ser: 1.15 mg/dL (ref 0.61–1.24)
GFR, Estimated: >60 mL/min (ref >60)
Glucose, Bld: 129 mg/dL — ABNORMAL HIGH (ref 70–99)
Potassium: 4.3 mmol/L (ref 3.5–5.1)
Sodium: 132 mmol/L — ABNORMAL LOW (ref 135–145)
Total Bilirubin: 4.5 mg/dL — ABNORMAL HIGH (ref 0.3–1.2)
Total Protein: 5.9 g/dL — ABNORMAL LOW (ref 6.5–8.1)

## 2022-09-11 LAB — URINALYSIS, ROUTINE W REFLEX MICROSCOPIC
Bilirubin Urine: NEGATIVE
Glucose, UA: NEGATIVE mg/dL
Hgb urine dipstick: NEGATIVE
Ketones, ur: NEGATIVE mg/dL
Leukocytes,Ua: NEGATIVE
Nitrite: NEGATIVE
Protein, ur: NEGATIVE mg/dL
Specific Gravity, Urine: 1.02 (ref 1.005–1.030)
pH: 6 (ref 5.0–8.0)

## 2022-09-11 LAB — HEPATIC FUNCTION PANEL
ALT: 194 U/L — ABNORMAL HIGH (ref 0–44)
AST: 274 U/L — ABNORMAL HIGH (ref 15–41)
Albumin: 3.9 g/dL (ref 3.5–5.0)
Alkaline Phosphatase: 437 U/L — ABNORMAL HIGH (ref 38–126)
Bilirubin, Direct: 2.2 mg/dL — ABNORMAL HIGH (ref 0.0–0.2)
Indirect Bilirubin: 1.5 mg/dL — ABNORMAL HIGH (ref 0.3–0.9)
Total Bilirubin: 3.7 mg/dL — ABNORMAL HIGH (ref 0.3–1.2)
Total Protein: 7.4 g/dL (ref 6.5–8.1)

## 2022-09-11 LAB — DIFFERENTIAL
Abs Immature Granulocytes: 0.06 10*3/uL (ref 0.00–0.07)
Basophils Absolute: 0.1 10*3/uL (ref 0.0–0.1)
Basophils Relative: 0 %
Eosinophils Absolute: 0.6 10*3/uL — ABNORMAL HIGH (ref 0.0–0.5)
Eosinophils Relative: 4 %
Immature Granulocytes: 0 %
Lymphocytes Relative: 20 %
Lymphs Abs: 3.2 10*3/uL (ref 0.7–4.0)
Monocytes Absolute: 1.3 10*3/uL — ABNORMAL HIGH (ref 0.1–1.0)
Monocytes Relative: 8 %
Neutro Abs: 10.9 10*3/uL — ABNORMAL HIGH (ref 1.7–7.7)
Neutrophils Relative %: 68 %

## 2022-09-11 LAB — MAGNESIUM: Magnesium: 2.1 mg/dL (ref 1.7–2.4)

## 2022-09-11 LAB — PROTIME-INR
INR: 1.1 (ref 0.8–1.2)
Prothrombin Time: 13.8 seconds (ref 11.4–15.2)

## 2022-09-11 LAB — CBC
HCT: 45.2 % (ref 39.0–52.0)
Hemoglobin: 15.1 g/dL (ref 13.0–17.0)
MCH: 29 pg (ref 26.0–34.0)
MCHC: 33.4 g/dL (ref 30.0–36.0)
MCV: 86.9 fL (ref 80.0–100.0)
Platelets: 249 10*3/uL (ref 150–400)
RBC: 5.2 MIL/uL (ref 4.22–5.81)
RDW: 13.5 % (ref 11.5–15.5)
WBC: 16.1 10*3/uL — ABNORMAL HIGH (ref 4.0–10.5)
nRBC: 0 % (ref 0.0–0.2)

## 2022-09-11 LAB — PHOSPHORUS: Phosphorus: 3.8 mg/dL (ref 2.5–4.6)

## 2022-09-11 LAB — LACTIC ACID, PLASMA: Lactic Acid, Venous: 1.8 mmol/L (ref 0.5–1.9)

## 2022-09-11 LAB — TSH: TSH: 2.028 u[IU]/mL (ref 0.350–4.500)

## 2022-09-11 LAB — BASIC METABOLIC PANEL
Anion gap: 11 (ref 5–15)
BUN: 18 mg/dL (ref 8–23)
CO2: 23 mmol/L (ref 22–32)
Calcium: 8.9 mg/dL (ref 8.9–10.3)
Chloride: 101 mmol/L (ref 98–111)
Creatinine, Ser: 1.29 mg/dL — ABNORMAL HIGH (ref 0.61–1.24)
GFR, Estimated: 55 mL/min — ABNORMAL LOW (ref >60)
Glucose, Bld: 143 mg/dL — ABNORMAL HIGH (ref 70–99)
Potassium: 4.1 mmol/L (ref 3.5–5.1)
Sodium: 135 mmol/L (ref 135–145)

## 2022-09-11 LAB — MRSA NEXT GEN BY PCR, NASAL: MRSA by PCR Next Gen: NOT DETECTED

## 2022-09-11 LAB — LIPASE, BLOOD: Lipase: 653 U/L — ABNORMAL HIGH (ref 11–51)

## 2022-09-11 LAB — RESP PANEL BY RT-PCR (RSV, FLU A&B, COVID)  RVPGX2
Influenza A by PCR: NEGATIVE
Influenza B by PCR: NEGATIVE
Resp Syncytial Virus by PCR: NEGATIVE
SARS Coronavirus 2 by RT PCR: NEGATIVE

## 2022-09-11 MED ORDER — LACTATED RINGERS IV BOLUS
500.0000 mL | Freq: Once | INTRAVENOUS | Status: AC
  Administered 2022-09-11: 500 mL via INTRAVENOUS

## 2022-09-11 MED ORDER — SODIUM CHLORIDE 0.9 % IV SOLN
2.0000 g | Freq: Once | INTRAVENOUS | Status: AC
  Administered 2022-09-11: 2 g via INTRAVENOUS
  Filled 2022-09-11: qty 20

## 2022-09-11 MED ORDER — GABAPENTIN 300 MG PO CAPS
400.0000 mg | ORAL_CAPSULE | Freq: Two times a day (BID) | ORAL | Status: DC
  Administered 2022-09-11 – 2022-09-15 (×7): 400 mg via ORAL
  Filled 2022-09-11 (×7): qty 1

## 2022-09-11 MED ORDER — ACETAMINOPHEN 650 MG RE SUPP: 650.0000 mg | Freq: Four times a day (QID) | RECTAL | Status: DC | PRN

## 2022-09-11 MED ORDER — ACETAMINOPHEN 325 MG PO TABS: 650.0000 mg | ORAL_TABLET | Freq: Four times a day (QID) | ORAL | Status: DC | PRN

## 2022-09-11 MED ORDER — IOHEXOL 350 MG/ML SOLN
100.0000 mL | Freq: Once | INTRAVENOUS | Status: AC | PRN
  Administered 2022-09-11: 100 mL via INTRAVENOUS

## 2022-09-11 MED ORDER — ONDANSETRON HCL 4 MG/2ML IJ SOLN
4.0000 mg | Freq: Once | INTRAMUSCULAR | Status: AC
  Administered 2022-09-11: 4 mg via INTRAVENOUS
  Filled 2022-09-11: qty 2

## 2022-09-11 MED ORDER — ALUM & MAG HYDROXIDE-SIMETH 200-200-20 MG/5ML PO SUSP
30.0000 mL | Freq: Once | ORAL | Status: AC
  Administered 2022-09-11: 30 mL via ORAL
  Filled 2022-09-11: qty 30

## 2022-09-11 MED ORDER — PIPERACILLIN-TAZOBACTAM 3.375 G IVPB 30 MIN
3.3750 g | Freq: Three times a day (TID) | INTRAVENOUS | Status: DC
  Administered 2022-09-11: 3.375 g via INTRAVENOUS
  Filled 2022-09-11 (×2): qty 50

## 2022-09-11 MED ORDER — LACTATED RINGERS IV BOLUS
1000.0000 mL | Freq: Once | INTRAVENOUS | Status: AC
  Administered 2022-09-11: 1000 mL via INTRAVENOUS

## 2022-09-11 MED ORDER — INSULIN ASPART 100 UNIT/ML IJ SOLN
0.0000 [IU] | INTRAMUSCULAR | Status: DC
  Administered 2022-09-12: 1 [IU] via SUBCUTANEOUS
  Administered 2022-09-12 – 2022-09-13 (×2): 2 [IU] via SUBCUTANEOUS
  Administered 2022-09-13: 1 [IU] via SUBCUTANEOUS
  Administered 2022-09-13 – 2022-09-14 (×2): 3 [IU] via SUBCUTANEOUS
  Administered 2022-09-14: 2 [IU] via SUBCUTANEOUS
  Administered 2022-09-14: 1 [IU] via SUBCUTANEOUS
  Administered 2022-09-15: 3 [IU] via SUBCUTANEOUS
  Administered 2022-09-15: 1 [IU] via SUBCUTANEOUS

## 2022-09-11 MED ORDER — ORAL CARE MOUTH RINSE: 15.0000 mL | OROMUCOSAL | Status: DC | PRN

## 2022-09-11 MED ORDER — FENTANYL CITRATE PF 50 MCG/ML IJ SOSY: 12.5000 ug | PREFILLED_SYRINGE | INTRAMUSCULAR | Status: DC | PRN

## 2022-09-11 MED ORDER — FAMOTIDINE IN NACL 20-0.9 MG/50ML-% IV SOLN
20.0000 mg | Freq: Once | INTRAVENOUS | Status: AC
  Administered 2022-09-11: 20 mg via INTRAVENOUS
  Filled 2022-09-11: qty 50

## 2022-09-11 MED ORDER — LACTATED RINGERS IV SOLN: INTRAVENOUS | Status: DC

## 2022-09-11 MED ORDER — CHLORHEXIDINE GLUCONATE CLOTH 2 % EX PADS
6.0000 | MEDICATED_PAD | Freq: Every day | CUTANEOUS | Status: DC
  Administered 2022-09-11 – 2022-09-14 (×4): 6 via TOPICAL

## 2022-09-11 MED ORDER — MORPHINE SULFATE (PF) 4 MG/ML IV SOLN
4.0000 mg | Freq: Once | INTRAVENOUS | Status: AC
  Administered 2022-09-11: 4 mg via INTRAVENOUS
  Filled 2022-09-11: qty 1

## 2022-09-11 MED ORDER — SODIUM CHLORIDE 0.9 % IV SOLN: INTRAVENOUS | Status: AC

## 2022-09-11 MED ORDER — PIPERACILLIN-TAZOBACTAM 3.375 G IVPB
3.3750 g | Freq: Three times a day (TID) | INTRAVENOUS | Status: DC
  Administered 2022-09-11 – 2022-09-14 (×8): 3.375 g via INTRAVENOUS
  Filled 2022-09-11 (×8): qty 50

## 2022-09-11 NOTE — ED Notes (Signed)
Only one set of blood cultures collected

## 2022-09-11 NOTE — Assessment & Plan Note (Signed)
Patient followed as an outpatient with rheumatology and pulmonology.  Continue oxygen as needed

## 2022-09-11 NOTE — Assessment & Plan Note (Signed)
Continue Neurontin 400 mg p.o. twice daily

## 2022-09-11 NOTE — ED Provider Notes (Addendum)
CT pending. Lipase 600. Anticipate admission. Physical Exam  BP 137/78   Pulse (!) 117   Temp 99 F (37.2 C) (Rectal)   Resp (!) 30   Ht '5\' 6"'$  (1.676 m)   Wt 78.9 kg   SpO2 99%   BMI 28.08 kg/m   Physical Exam Constitutional:      Comments: Patient is alert.  Clear mental status.  Mild tachypnea.  Speaking in full sentences.  No acute distress.  Heart rate on monitor 115 narrow complex.  HENT:     Mouth/Throat:     Pharynx: Oropharynx is clear.  Eyes:     Extraocular Movements: Extraocular movements intact.  Pulmonary:     Comments: Mild tachypnea. Abdominal:     Comments: Abdomen is soft.  No guarding.  He is denying pain to palpation in the right upper quadrant or epigastrium.  Musculoskeletal:     Comments: Significant peripheral edema.  Condition of the lower legs is good.  Warm and dry.  No calf tenderness.  Skin:    General: Skin is warm and dry.  Neurological:     General: No focal deficit present.     Mental Status: He is oriented to person, place, and time.     Procedures  Procedures  ED Course / MDM   Clinical Course as of 09/11/22 1535  Thu Sep 11, 2022  1301 Patient had no improvement of pain with GI cocktail. Due to severe pain radiating from abd to back, CT dissection study will be ordered. He was given morphine for pain. [VK]  1428 Working up concerning for pancreatitis vs biliary obstruction. CT dissection study pending for further evaluation. Initial troponin negative. [VK]  1518 Patient CT shows concern for choledocholithiasis.  Patient states that he sees Dr. Lyndel Safe with GI and we will consult on-call physician.  Patient was signed out to Dr. Vallery Ridge in stable condition pending GI recommendations with plan for likely admission. [VK]    Clinical Course User Index [VK] Kemper Durie, DO   Medical Decision Making Amount and/or Complexity of Data Reviewed Labs: ordered. Radiology: ordered.  Risk OTC drugs. Prescription drug  management. Decision regarding hospitalization.   CT scan that showed choledocholithiasis.  Patient has elevated lipase as well as total bili.  Consult: Reviewed with Dr. Alessandra Bevels.  Advises can be admitted to either [facility] or [facility] and needs to get ERCP.  Consult: 51: 23 Dr. Grandville Silos for [facility] hospitalist admission.       Charlesetta Shanks, MD 09/11/22 1558    Charlesetta Shanks, MD 09/11/22 [phone]

## 2022-09-11 NOTE — Care Plan (Signed)
[facility], 87 year old history of interstitial lung disease on home O2 as needed, CAD, hypertension, diabetes, CKD presenting with epigastric/chest pain radiating to the back for several hours with a burning sensation.  Patient noted to have tried over-the-counter PPI with no significant improvement and presented to the ED.  Patient seen in the ED workup concerning for probable gallstone pancreatitis with elevated lipase levels, elevated LFTs, elevated bilirubin.  CT angiogram chest abdomen and pelvis done with concerns for choledocholithiasis and recommending MRCP.  ED physician spoke with Dr. Wynelle Beckmann GI who recommended admission and further workup for ERCP.  During stay in the ED patient noted to have now with tachycardia, tachypnea, with elevated leukocytosis ED physician concern for evolving sepsis and recommended admission.  Patient pancultured.  Patient accepted to stepdown bed.  Patient did receive IV Rocephin in the ED, going to get IV Zosyn.  Patient placed on IV fluids.  Pain management.  Once patient arrives will need to get a formal consult from [facility] as that some ED physician spoke with.

## 2022-09-11 NOTE — ED Notes (Signed)
Pt. Has back pain and is complaining of a burning in his mid esophageral area with high b/p  and his monitor reveals a flutter to unknown due to Pt. Shaking.  Temp of Pt. Is 99 rectal

## 2022-09-11 NOTE — Assessment & Plan Note (Signed)
Older sliding scale hold p.o. medications

## 2022-09-11 NOTE — H&P (Signed)
Kerry Matthews AST:[id number] DOB: 07-08-1936 DOA: 09/11/2022     PCP: [name], MD   Outpatient Specialists:   CARDS:   Dr. Janyth Contes Dr. Trudie Reed, Rheumatology  Pulmonary Dr. Vaughan Browner   GI  Dr. Lyndel Safe [facility], )   Patient arrived to ER on 09/11/22 at 1121 Referred by Attending Charlesetta Shanks, MD   Patient coming from:    home Lives   With family    Chief Complaint:   Chief Complaint  Patient presents with   Chest Pain    HPI: Kerry Matthews is a 87 y.o. male with medical history significant of interstitial lung disease on home O2 as needed, CAD, hypertension, diabetes, CKD, microscopic polyangiitis  Presented with epigastric/chest pain Pt with epigastric pain radiating to the back starting this morning.  After eating breakfast.  He tried to use over-the-counter PPI but did not seem to help so he went to emergency department.  At ED CT scan showed evidence of choledocholithiasis patient will need further imaging [facility] has been notified Dr. Shelton Silvas will see patient in consult tomorrow. Patient was concerning for having sepsis started on Rocephin IV fluid given  Family reports low grade fever and chills Family reports he had episode of indigesting few days ago after eating oranges  No vomiting some nausea no diarrhea  Reports burning epigastric pain      Initial COVID TEST  NEGATIVE   Lab Results  Component Value Date   SARSCOV2NAA NEGATIVE 09/11/2022   SARSCOV2NAA POSITIVE (A) 08/12/2021   [place] NEGATIVE 12/13/2020   [place] NEGATIVE 11/08/2020     Regarding pertinent Chronic problems:    Hyperlipidemia - on statins Crestor Lipid Panel     Component Value Date/Time   CHOL 160 07/08/2022 0910   TRIG 136 07/08/2022 0910   HDL 42 07/08/2022 0910   CHOLHDL 3.8 07/08/2022 0910   LDLCALC 94 07/08/2022 0910   LABVLDL 24 07/08/2022 0910     HTN on Lasix prn     CAD  - On Aspirin, statin,                  -  followed by cardiology                     DM 2 -  Lab Results  Component Value Date   HGBA1C 6.1 (H) 07/08/2022   on PO meds only,    Interstitial lung disease -  followed by pulmonology  on baseline oxygen  1L,   Serologies reviewed which show elevated p-ANCA, myeloperoxidase with elevated CRP, normal sed rate, elevated IgG4 subclass, repeat rheumatoid factor is negative. 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 started on Rituxan by Dr. Trudie Reed from rheumatology in late 2020 with tapering off of prednisone. Symptoms have been stable while on Rituxan     CKD stage IIIa- baseline Cr 1.1 Estimated Creatinine Clearance: 41.7 mL/min (A) (by C-G formula based on SCr of 1.29 mg/dL (H)).  Lab Results  Component Value Date   CREATININE 1.29 (H) 09/11/2022   CREATININE 1.19 07/08/2022   CREATININE 1.09 04/30/2022       While in ER: Clinical Course as of 09/11/22 1928  Thu Sep 11, 2022  1301 Patient had no improvement of pain with GI cocktail. Due to severe pain radiating from abd to back, CT dissection study will be ordered. He was given morphine for pain. [VK]  1428 Working up concerning  for pancreatitis vs biliary obstruction. CT dissection study pending for further evaluation. Initial troponin negative. [VK]  1518 Patient CT shows concern for choledocholithiasis.  Patient states that he sees Dr. Lyndel Safe with GI and we will consult on-call physician.  Patient was signed out to Dr. Vallery Ridge in stable condition pending GI recommendations with plan for likely admission. [VK]    Clinical Course User Index [VK] Leanord Asal K, DO     CXR - Low lung volumes with extensive interstitial changes, similar to previous     CTA chest abd/pelvis-   no PE, High attenuation within the downstream common bile duct near the ampulla. This may reflect abnormal mucosal enhancement versus choledocholithiasis  Following Medications were ordered in  ER: Medications  lactated ringers infusion (0 mLs Intravenous Hold 09/11/22 1704)  piperacillin-tazobactam (ZOSYN) IVPB 3.375 g (3.375 g Intravenous New Bag/Given 09/11/22 1848)  Chlorhexidine Gluconate Cloth 2 % PADS 6 each (6 each Topical Given 09/11/22 1842)  Oral care mouth rinse (has no administration in time range)  famotidine (PEPCID) IVPB 20 mg premix (0 mg Intravenous Stopped 09/11/22 1315)  alum & mag hydroxide-simeth (MAALOX/MYLANTA) 200-200-20 MG/5ML suspension 30 mL (30 mLs Oral Given 09/11/22 1246)  ondansetron (ZOFRAN) injection 4 mg (4 mg Intravenous Given 09/11/22 1243)  morphine (PF) 4 MG/ML injection 4 mg (4 mg Intravenous Given 09/11/22 1315)  iohexol (OMNIPAQUE) 350 MG/ML injection 100 mL (100 mLs Intravenous Contrast Given 09/11/22 1425)  lactated ringers bolus 1,000 mL (0 mLs Intravenous Stopped 09/11/22 1623)  cefTRIAXone (ROCEPHIN) 2 g in sodium chloride 0.9 % 100 mL IVPB (0 g Intravenous Stopped 09/11/22 1736)  lactated ringers bolus 500 mL (500 mLs Intravenous New Bag/Given 09/11/22 1736)    _______________________________________________________ ER Provider Called:   [facility]   Dr.Brahbhatt  They Recommend admit to medicine   Will see in AM     ED Triage Vitals  Enc Vitals Group     BP 09/11/22 1137 (!) 156/83     Pulse Rate 09/11/22 1137 72     Resp 09/11/22 1137 (!) 28     Temp 09/11/22 1137 97.8 F (36.6 C)     Temp Source 09/11/22 1137 Oral     SpO2 09/11/22 1137 96 %     Weight 09/11/22 1135 174 lb (78.9 kg)     Height 09/11/22 1135 '5\' 6"'$  (1.676 m)     Head Circumference --      Peak Flow --      Pain Score 09/11/22 1136 8     Pain Loc --      Pain Edu? --      Excl. in [place]? --   TMAX(24)@     _________________________________________ Significant initial  Findings: Abnormal Labs Reviewed  BASIC METABOLIC PANEL - Abnormal; Notable for the following components:      Result Value   Glucose, Bld 143 (*)    Creatinine, Ser 1.29 (*)    GFR, Estimated 55 (*)     All other components within normal limits  CBC - Abnormal; Notable for the following components:   WBC 16.1 (*)    All other components within normal limits  HEPATIC FUNCTION PANEL - Abnormal; Notable for the following components:   AST 274 (*)    ALT 194 (*)    Alkaline Phosphatase 437 (*)    Total Bilirubin 3.7 (*)    Bilirubin, Direct 2.2 (*)    Indirect Bilirubin 1.5 (*)    All other components within normal limits  LIPASE, BLOOD - Abnormal; Notable for the following components:   Lipase 653 (*)    All other components within normal limits  DIFFERENTIAL - Abnormal; Notable for the following components:   Neutro Abs 10.9 (*)    Monocytes Absolute 1.3 (*)    Eosinophils Absolute 0.6 (*)    All other components within normal limits     _________________________ Troponin 7  ECG: Ordered Personally reviewed and interpreted by me showing: HR : 67 Rhythm:Sinus rhythm Atrial premature complex Left anterior fascicular block Abnormal R-wave progression, late transition Interpretation limited secondary to artifact No significant change since last tracing QTC 426  __________ This patient meets SIRS Criteria and may be septic.     The recent clinical data is shown below. Vitals:   09/11/22 1630 09/11/22 1700 09/11/22 1738 09/11/22 1800  BP:  101/64    Pulse:  100 93   Resp:  (!) 29 (!) 31   Temp: 99.8 F (37.7 C)  99.3 F (37.4 C)   TempSrc: Oral  Oral   SpO2:  97% 93%   Weight:    77 kg  Height:    '5\' 6"'$  (1.676 m)    WBC     Component Value Date/Time   WBC 16.1 (H) 09/11/2022 1151   LYMPHSABS 3.2 09/11/2022 1400   LYMPHSABS 4.6 (H) 07/08/2022 0910   MONOABS 1.3 (H) 09/11/2022 1400   EOSABS 0.6 (H) 09/11/2022 1400   EOSABS 0.8 (H) 07/08/2022 0910   BASOSABS 0.1 09/11/2022 1400   BASOSABS 0.1 07/08/2022 0910    Lactic Acid, Venous    Component Value Date/Time   LATICACIDVEN 1.8 09/11/2022 1150    Procalcitonin   Ordered      UA   no evidence of UTI     Urine analysis:    Component Value Date/Time   COLORURINE YELLOW 09/11/2022 [place] 09/11/2022 1445   LABSPEC 1.020 09/11/2022 1445   PHURINE 6.0 09/11/2022 1445   GLUCOSEU NEGATIVE 09/11/2022 1445   HGBUR NEGATIVE 09/11/2022 1445   BILIRUBINUR NEGATIVE 09/11/2022 1445   BILIRUBINUR negative 09/04/2022 1445   BILIRUBINUR NEGATIVE 03/14/2022 1138   KETONESUR NEGATIVE 09/11/2022 1445   PROTEINUR NEGATIVE 09/11/2022 1445   UROBILINOGEN 0.2 09/04/2022 1445   NITRITE NEGATIVE 09/11/2022 1445   LEUKOCYTESUR NEGATIVE 09/11/2022 1445    Results for orders placed or performed during the hospital encounter of 09/11/22  Resp panel by RT-PCR (RSV, Flu A&B, Covid) Nasopharyngeal Swab     Status: None   Collection Time: 09/11/22 11:51 AM   Specimen: Nasopharyngeal Swab; Nasal Swab  Result Value Ref Range Status   SARS Coronavirus 2 by RT PCR NEGATIVE NEGATIVE Final         Influenza A by PCR NEGATIVE NEGATIVE Final   Influenza B by PCR NEGATIVE NEGATIVE Final         Resp Syncytial Virus by PCR NEGATIVE NEGATIVE Final        Blood culture (routine x 2)     Status: None (Preliminary result)   Collection Time: 09/11/22 12:45 PM   Specimen: BLOOD RIGHT WRIST  Result Value Ref Range Status   Specimen Description   Final    BLOOD RIGHT WRIST Performed at [facility], [address], [place], [place] [address]    Special Requests   Final    Blood Culture adequate volume BOTTLES DRAWN AEROBIC AND ANAEROBIC Performed at [facility], [address] [place], [place] [address]  Culture PENDING  Incomplete   Report Status PENDING  Incomplete    _______________________________________________ Hospitalist was called for admission for   Choledocholithiasis and  sepsis    The following Work up has been ordered so far:  Orders Placed This Encounter  Procedures   Resp panel by RT-PCR (RSV, Flu A&B, Covid) Nasopharyngeal Swab   Blood culture (routine x  2)   MRSA Next Gen by PCR, Nasal   DG Chest 2 View   CT Angio Chest/Abd/Pel for Dissection W and/or Wo Contrast   Basic metabolic panel   CBC   Hepatic function panel   Lipase, blood   Differential   Urinalysis, Routine w reflex microscopic Urine, Clean Catch   Protime-INR   Diet NPO time specified   Document Height and Actual Weight   Cardiac monitoring   Saline Lock IV, Maintain IV access   Check Rectal Temperature   Cardiac Monitoring - Continuous Indefinite   Complete oral care assessment tool on admission, transfer, and q shift   Refer to Sidebar Report Adult Oral Care Protocol   Swab Process:   Considerations:   If MRSA PCR Screen is Positive:   Refer to Sidebar Report - CHG cloths Sidebar   Patient education (specify): - [facility] Daily CHG Bathing   Brush teeth with toothbrush and toothpaste 3 times daily   Apply moisturizer in mouth and lips prn   Consult to gastroenterology   Consult to hospitalist   Pulse oximetry, continuous   EKG 12-Lead   ED EKG within 10 minutes   Admit to Inpatient (patient's expected length of stay will be greater than 2 midnights or inpatient only procedure)     OTHER Significant initial  Findings:  labs showing:    Recent Labs  Lab 09/11/22 1151  NA 135  K 4.1  CO2 23  GLUCOSE 143*  BUN 18  CREATININE 1.29*  CALCIUM 8.9    Cr     Up from baseline see below Lab Results  Component Value Date   CREATININE 1.29 (H) 09/11/2022   CREATININE 1.19 07/08/2022   CREATININE 1.09 04/30/2022    Recent Labs  Lab 09/11/22 1151  AST 274*  ALT 194*  ALKPHOS 437*  BILITOT 3.7*  PROT 7.4  ALBUMIN 3.9   Lab Results  Component Value Date   CALCIUM 8.9 09/11/2022   PHOS 2.4 (L) 07/09/2020          Plt: Lab Results  Component Value Date   PLT 249 09/11/2022       Recent Labs  Lab 09/11/22 1151 09/11/22 1400  WBC 16.1*  --   NEUTROABS  --  10.9*  HGB 15.1  --   HCT 45.2  --   MCV 86.9  --   PLT 249  --     HG/HCT   stable,     Component Value Date/Time   HGB 15.1 09/11/2022 1151   HGB 14.9 07/08/2022 0910   HCT 45.2 09/11/2022 1151   HCT 45.6 07/08/2022 0910   MCV 86.9 09/11/2022 1151   MCV 88 07/08/2022 0910    Recent Labs  Lab 09/11/22 1151  LIPASE 653*   No results for input(s): "AMMONIA" in the last 168 hours.    Cardiac Panel (last 3 results) No results for input(s): "CKTOTAL", "CKMB", "TROPONINI", "RELINDX" in the last 72 hours.  .car BNP (last 3 results) No results for input(s): "BNP" in the last 8760 hours.    DM  labs:  HbA1C: Recent Labs  12/26/21 0831 03/31/22 0844 07/08/22 0910  HGBA1C 7.4* 6.6* 6.1*       CBG (last 3)  No results for input(s): "GLUCAP" in the last 72 hours.        Cultures:    Component Value Date/Time   SDES  09/11/2022 1245    BLOOD RIGHT WRIST Performed at [facility] [address], [place], [place] [address]    [facility]  09/11/2022 1245    Blood Culture adequate volume BOTTLES DRAWN AEROBIC AND ANAEROBIC Performed at [facility], [address] [place], [place] [address]    CULT PENDING 09/11/2022 1245   REPTSTATUS PENDING 09/11/2022 1245     Radiological Exams on Admission: CT Angio Chest/Abd/Pel for Dissection W and/or Wo Contrast  Result Date: 09/11/2022 CLINICAL DATA:  Chest pain since Saturday, radiating to back, dyspnea on exertion, nausea, dizziness EXAM: CT ANGIOGRAPHY CHEST, ABDOMEN AND PELVIS TECHNIQUE: Non-contrast CT of the chest was initially obtained. Multidetector CT imaging through the chest, abdomen and pelvis was performed using the standard protocol during bolus administration of intravenous contrast. Multiplanar reconstructed images and MIPs were obtained and reviewed to evaluate the vascular anatomy. RADIATION DOSE REDUCTION: This exam was performed according to the departmental dose-optimization program which includes automated exposure control, adjustment of the mA and/or kV according  to patient size and/or use of iterative reconstruction technique. CONTRAST:  158m OMNIPAQUE IOHEXOL 350 MG/ML SOLN COMPARISON:  01/28/2022, 09/11/2022 FINDINGS: CTA CHEST FINDINGS Cardiovascular: No evidence of thoracic aortic aneurysm or dissection. Atherosclerosis of the aortic arch is again noted. The heart is unremarkable without pericardial effusion. While not optimized for the opacification of the pulmonary vasculature, there is adequate opacification to exclude central and segmental pulmonary emboli. Mediastinum/Nodes: No enlarged mediastinal, hilar, or axillary lymph nodes. Thyroid gland, trachea, and esophagus demonstrate no significant findings. Lungs/Pleura: Basilar predominant subpleural scarring and fibrosis unchanged. No acute airspace disease, effusion, or pneumothorax. The central airways are patent. Musculoskeletal: There are no acute displaced fractures. Reconstructed images demonstrate no additional findings. Review of the MIP images confirms the above findings. CTA ABDOMEN AND PELVIS FINDINGS VASCULAR Aorta: Normal caliber aorta without aneurysm, dissection, vasculitis or significant stenosis. Mild atherosclerosis. Celiac: Patent without evidence of aneurysm, dissection, vasculitis or significant stenosis. Mild ostial atherosclerosis without flow limiting stenosis. SMA: Patent without evidence of aneurysm, dissection, vasculitis or significant stenosis. Mild ostial atherosclerosis without flow limiting stenosis. Renals: There is moderate atherosclerosis at the origin of the bilateral renal arteries, right greater than left. Right renal artery stenosis estimated 50-70%, with left renal artery stenosis estimated 50%. No aneurysm, dissection, vasculitis, or evidence of fibromuscular dysplasia. IMA: Patent without evidence of aneurysm, dissection, vasculitis or significant stenosis. Inflow: Patent without evidence of aneurysm, dissection, vasculitis or significant stenosis. Veins: No obvious venous  abnormality within the limitations of this arterial phase study. Review of the MIP images confirms the above findings. NON-VASCULAR Hepatobiliary: Respiratory motion limits evaluation. There are no focal liver abnormalities. Gallbladder is moderately distended without evidence of cholelithiasis or cholecystitis. Common bile duct measures up to 9 mm in diameter, which may be appropriate for age. Within the downstream common bile duct near the ampulla, reference image 100/6, there is a 10 mm hyperdense structure. This could reflect mucosal enhancement or choledocholithiasis. If biliary pathology is clinically suspected, MRCP could be performed. Pancreas: Diffuse fatty atrophy of the pancreas. Spleen: Normal in size without focal abnormality. Adrenals/Urinary Tract: Adrenal glands are unremarkable. Kidneys are normal, without renal calculi, focal lesion,  or hydronephrosis. Bladder is unremarkable. Stomach/Bowel: No bowel obstruction or ileus. Diverticulosis of the distal colon without diverticulitis. No bowel wall thickening or inflammatory change. Lymphatic: No pathologic adenopathy within the abdomen or pelvis. Reproductive: Prostate is unremarkable. Other: No free fluid or free intraperitoneal gas. There is a fat containing right inguinal hernia. No bowel herniation. Musculoskeletal: No acute or destructive bony lesions. Chronic appearing L3 compression deformity. Reconstructed images demonstrate no additional findings. Review of the MIP images confirms the above findings. IMPRESSION: 1. No evidence of thoracoabdominal aortic aneurysm or dissection. 2. No evidence of pulmonary embolus. 3. High attenuation within the downstream common bile duct near the ampulla. This may reflect abnormal mucosal enhancement versus choledocholithiasis. Borderline dilation of the extrahepatic common bile duct. If further evaluation is desired, MRCP could be performed. 4. Distal colonic diverticulosis without diverticulitis. 5. Fat  containing right inguinal hernia. 6. Aortic Atherosclerosis (ICD10-I70.0). Mild stenosis of the bilateral renal arteries at their origins, right greater than left. 7. Stable bibasilar parenchymal lung scarring and fibrosis. Electronically Signed   By: Randa Ngo M.D.   On: 09/11/2022 15:14   DG Chest 2 View  Result Date: 09/11/2022 CLINICAL DATA:  Chest pain EXAM: CHEST - 2 VIEW COMPARISON:  CT 01/28/2022.  X-ray 09/20/2021 and older FINDINGS: Stable cardiopericardial silhouette. No pneumothorax or effusion. Volume loss seen, underinflation with diffuse extensive interstitial changes noted bilaterally, similar to previous. Likely a chronic process. Please correlate with prior CT scan for interstitial lung disease. Air-fluid level along the stomach beneath the left hemidiaphragm. Overlapping cardiac leads IMPRESSION: Low lung volumes with extensive interstitial changes, similar to previous. Please correlate for history of interstitial lung disease. No secondary consolidation at this time or effusion Electronically Signed   By: Jill Side M.D.   On: 09/11/2022 12:24   _______________________________________________________________________________________________________ Latest  Blood pressure 101/64, pulse 93, temperature 99.3 F (37.4 C), temperature source Oral, resp. rate (!) 31, height '5\' 6"'$  (1.676 m), weight 77 kg, SpO2 93 %.   Vitals  labs and radiology finding personally reviewed  Review of Systems:    Pertinent positives include:  chills, fatigue, abdominal pain, nausea, Constitutional:  No weight loss, night sweats, Fevers,  weight loss  HEENT:  No headaches, Difficulty swallowing,Tooth/dental problems,Sore throat,  No sneezing, itching, ear ache, nasal congestion, post nasal drip,  Cardio-vascular:  No chest pain, Orthopnea, PND, anasarca, dizziness, palpitations.no Bilateral lower extremity swelling  GI:  No heartburn, indigestion,  vomiting, diarrhea, change in bowel habits,  loss of appetite, melena, blood in stool, hematemesis Resp:  no shortness of breath at rest. No dyspnea on exertion, No excess mucus, no productive cough, No non-productive cough, No coughing up of blood.No change in color of mucus.No wheezing. Skin:  no rash or lesions. No jaundice GU:  no dysuria, change in color of urine, no urgency or frequency. No straining to urinate.  No flank pain.  Musculoskeletal:  No joint pain or no joint swelling. No decreased range of motion. No back pain.  Psych:  No change in mood or affect. No depression or anxiety. No memory loss.  Neuro: no localizing neurological complaints, no tingling, no weakness, no double vision, no gait abnormality, no slurred speech, no confusion  All systems reviewed and apart from [place] all are negative _______________________________________________________________________________________________ Past Medical History:   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  ANCA antibodies- myeloperoxidase ABS-greater than 100, ANCA proteinase 3-less than 3.5 04/02/2019- ANCA titers- p-ANCA +  1: 160, C ANCA-less than 1: 20, atypical p-ANCA titer-less than 1: 20   Abnormal CT of the chest    Abnormal findings on diagnostic imaging of lung 04/12/2019   04/01/2019-CT chest with contrast- no acute process in chest abdomen or pelvis, interstitial lung disease suspicious for early or mild UIP, pulmonary artery enlargement suggest PAH    Acute pain of left knee 11/30/2019   Alpha-1-antitrypsin deficiency carrier 05/04/2019   Atherosclerotic heart disease of native coronary artery without angina pectoris 05/03/2018   Body mass index (BMI) 29.0-29.9, adult 01/05/2020   Bradycardia    Chronic kidney disease, stage 3a ([place]) 08/12/2021   CKD (chronic kidney disease) 05/03/2018   Coronary artery disease    Coronary artery disease involving native coronary artery of native heart without angina pectoris  05/03/2018   Diabetes mellitus type 2 in obese ([place]) 04/01/2019   Diabetic polyneuropathy associated with type 2 diabetes mellitus ([place]) 08/12/2021   Dyslipidemia associated with type 2 diabetes mellitus ([place]) 05/03/2018   Elevated rheumatoid factor 04/12/2019   04/03/2019-rheumatoid factor-95.4   Essential hypertension 05/03/2018   GAD (generalized anxiety disorder)    GERD without esophagitis 05/03/2018   Hematuria 09/07/2020   History of kidney stones    Hypertensive heart disease without heart failure 05/03/2018   Hyponatremia 08/12/2021   Idiopathic pulmonary fibrosis ([place]) 03/29/2021   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   Insomnia 05/03/2018   Interstitial pulmonary disease ([place]) 04/12/2019   04/01/2019-CT chest with contrast- no acute process in chest abdomen or pelvis, interstitial lung disease suspicious for early or mild UIP, pulmonary artery enlargement suggest PAH  04/02/2019-connective tissue work-up: Anti-Jo 1-negative Anti-DNA antibody double-stranded-negative Anti-scleroderma antibody-negative Sjogren's syndrome antibody-negative Sjogren's syndrome antibody-negative CK-31 CCP-6 E   Leukocytosis 03/08/2020   Low vitamin B12 level 04/03/2019   Lower extremity edema    Medicare annual wellness visit, subsequent 08/09/2020   Microscopic polyangiitis ([place]) 03/29/2021   Mixed diabetic hyperlipidemia associated with type 2 diabetes mellitus ([place]) 08/12/2021   Mixed hyperlipidemia 05/03/2018   Myalgia 04/19/2019   Osteoarthritis    Other emphysema ([place])    Other long term (current) drug therapy 03/29/2021   Peripheral polyneuropathy 01/30/2020   Polymyositis ([place])    Primary insomnia    Proteinuria 09/07/2020   Renal insufficiency 09/06/2020   Respiratory disorder concurrent with and due to microscopic polyangiitis ([place]) 08/12/2021   Rheumatoid factor positive 03/29/2021   Sepsis ([place]) 04/19/2019   Skin cancer    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/17/2020    Transaminitis    Trigger finger 05/03/2018   Type 2 diabetes mellitus with hyperglycemia, with long-term current use of insulin ([place]) 04/23/2021   UC (ulcerative colitis) ([place])    UTI (urinary tract infection)    Vasculitis ([place]) 03/29/2021     Past Surgical History:  Procedure Laterality Date   ANGIOPLASTY  2010   BACK SURGERY     CATARACT EXTRACTION     COLONOSCOPY  06/16/2005   Mild colitis involving splenic flexure. Colonic polyps, status post polypectomy. Mild pancolonic diverticulitits. Internal hemorrhoids.    ESOPHAGOGASTRODUODENOSCOPY  04/26/2003   Irregular Z line suggestive of GERD. Mild gastritis status post CLO testing.    TOTAL KNEE ARTHROPLASTY Left 12/17/2020   Procedure: LEFT TOTAL KNEE ARTHROPLASTY;  Surgeon: Leandrew Koyanagi, MD;  Location: [facility];  Service: Orthopedics;  Laterality: Left;   TRIGGER FINGER RELEASE      Social History:  Ambulatory   independently      reports that  he has never smoked. He has never used smokeless tobacco. He reports that he does not drink alcohol and does not use drugs.   Family History:  Family History  Problem Relation Age of Onset   Tuberculosis Mother    Stroke Father    Pancreatic cancer Sister    Heart attack Sister    Lung disease Sister    Clotting disorder Brother    Colon cancer Neg Hx    Esophageal cancer Neg Hx    ______________________________________________________________________________________________ Allergies: Allergies  Allergen Reactions   Ace Inhibitors Other (See Comments)    = Slow heart rate   Hydrocodone Nausea And Vomiting   Hydrocodone-Acetaminophen Nausea And Vomiting   Nsaids Other (See Comments)    Kidney issues   Sulfamethoxazole Nausea And Vomiting   Sulfamethoxazole-Trimethoprim Nausea And Vomiting   Trimethoprim Other (See Comments)    Reaction not recalled    Prior to Admission medications   Medication Sig Start Date End Date Taking? Authorizing Provider  Alcohol Swabs (B-D  SINGLE USE SWABS REGULAR) PADS 1 each by Does not apply route in the morning, at noon, in the evening, and at bedtime. 12/25/21   [name], MD  aspirin EC 81 MG tablet Take 81 mg by mouth daily. Swallow whole.    [provider]  Coenzyme Q10 100 MG TABS Take 100 mg by mouth at bedtime.    [provider]  colchicine 0.6 MG tablet Take 1 tablet (0.6 mg total) by mouth 2 (two) times daily. With food 07/08/22   Cox, Kirsten, MD  furosemide (LASIX) 20 MG tablet TAKE 1 TABLET EVERY DAY AS NEEDED 04/11/22   Cox, Kirsten, MD  gabapentin (NEURONTIN) 400 MG capsule TAKE 1 CAPSULE THREE TIMES DAILY Patient taking differently: Take 400 mg by mouth 2 (two) times daily. Twice a day 03/17/22   Cox, Elnita Maxwell, MD  glucose blood (TRUE METRIX BLOOD GLUCOSE TEST) test strip TEST BLOOD SUGAR THREE TIMES DAILY BEFORE MEALS 12/19/21   Cox, Elnita Maxwell, MD  metFORMIN (GLUCOPHAGE) 500 MG tablet Take 1 tablet by mouth once daily with breakfast 08/27/22   Cox, Elnita Maxwell, MD  Multiple Vitamins-Minerals (PRESERVISION AREDS 2) CAPS Take 1 capsule by mouth 2 (two) times daily after a meal.    [provider]  nitroGLYCERIN (NITROSTAT) 0.4 MG SL tablet Place 1 tablet (0.4 mg total) under the tongue every 5 (five) minutes as needed for chest pain. 03/13/22   Revankar, Reita Cliche, MD  omeprazole (PRILOSEC) 20 MG capsule TAKE 1 CAPSULE EVERY DAY 06/16/22   Cox, Kirsten, MD  OXYGEN Inhale 1 L into the lungs daily. Using as needed during the day and continuous at night.    [provider]  riTUXimab (RITUXAN) 500 MG/50ML injection '1000mg'$  Intravenous day 1 and day 15 then every 24 weeks for 14 days 06/18/22   [provider]  rosuvastatin (CRESTOR) 20 MG tablet TAKE 1 TABLET EVERY DAY 04/11/22   Cox, Kirsten, MD  vitamin B-12 (CYANOCOBALAMIN) 1000 MCG tablet Take 1 tablet (1,000 mcg total) by mouth daily. 04/06/19   Geradine Girt, DO    _____________________________________________________________________________________ Physical Exam:    09/11/2022    6:00 PM 09/11/2022    5:38 PM 09/11/2022    5:00 PM  Vitals with BMI  Height '5\' 6"'$     Weight 169 lbs 12 oz    BMI 17.40    Systolic   814  Diastolic   64  Pulse  93 481  1. General:  in No  Acute distress    Chronically ill   -appearing 2. Psychological: Alert and   Oriented 3. Head/ENT:    Dry Mucous Membranes                          Head Non traumatic, neck supple                           Poor Dentition 4. SKIN:  decreased Skin turgor,  Skin clean Dry and intact no rash 5. Heart: Regular rate and rhythm no  Murmur, no Rub or gallop 6. Lungs:  Clear to auscultation bilaterally, no wheezes or crackles   7. Abdomen: Soft,  non-tender, Non distended  bowel sounds present 8. Lower extremities: no clubbing, cyanosis, no  edema 9. Neurologically Grossly intact, moving all 4 extremities equally   strength 5 out of 5 in all 4 extremities cranial nerves II through XII intact 10. MSK: Normal range of motion    Chart has been reviewed  ______________________________________________________________________________________________  Assessment/Plan  87 y.o. male with medical history significant of interstitial lung disease on home O2 as needed, CAD, hypertension, diabetes, CKD, microscopic polyangiitis   Admitted for   Choledocholithiasis    Present on Admission:  Sepsis ([place])  Chronic kidney disease, stage 3a ([place])  Diabetic polyneuropathy associated with type 2 diabetes mellitus ([place])  Mixed hyperlipidemia  Coronary artery disease involving native coronary artery of native heart without angina pectoris  Interstitial pulmonary disease ([place])  Choledocholithiasis     Chronic kidney disease, stage 3a ([place])  -chronic avoid nephrotoxic medications such as NSAIDs, Vanco Zosyn combo,  avoid hypotension, continue to follow renal function   Diabetic polyneuropathy  associated with type 2 diabetes mellitus (HCC) Continue Neurontin 400 mg p.o. twice daily  Sepsis ([place])  -SIRS criteria met with  elevated white blood cell count,       Component Value Date/Time   WBC 16.1 (H) 09/11/2022 1151   LYMPHSABS 3.2 09/11/2022 1400   LYMPHSABS 4.6 (H) 07/08/2022 0910     tachycardia     RR >20 Today's Vitals   09/11/22 1738 09/11/22 1744 09/11/22 1800 09/11/22 1900  BP:    (!) 103/57  Pulse: 93   67  Resp: (!) 31   (!) 27  Temp: 99.3 F (37.4 C)     TempSrc: Oral     SpO2: 93%   96%  Weight:   77 kg   Height:   '5\' 6"'$  (1.676 m)   PainSc:  0-No pain       The recent clinical data is shown below. Vitals:   09/11/22 1700 09/11/22 1738 09/11/22 1800 09/11/22 1900  BP: 101/64   (!) 103/57  Pulse: 100 93  67  Resp: (!) 29 (!) 31  (!) 27  Temp:  99.3 F (37.4 C)    TempSrc:  Oral    SpO2: 97% 93%  96%  Weight:   77 kg   Height:   '5\' 6"'$  (1.676 m)     Source -Most likely source being , intra-abdominal,    Patient meeting criteria for Severe sepsis with    evidence of end organ damage/organ dysfunction such as      Acute hypoxia requiring new supplemental oxygen, SpO2: 96 % O2 Flow Rate (L/min): 2 L/min      - Obtain serial lactic acid and procalcitonin level.  - Initiated IV  antibiotics in ER: Antibiotics Given (last 72 hours)     Date/Time Action Medication Dose Rate   09/11/22 1627 New Bag/Given   cefTRIAXone (ROCEPHIN) 2 g in sodium chloride 0.9 % 100 mL IVPB 2 g 200 mL/hr   09/11/22 1703 New Bag/Given   piperacillin-tazobactam (ZOSYN) IVPB 3.375 g 3.375 g 100 mL/hr   09/11/22 1848 New Bag/Given   piperacillin-tazobactam (ZOSYN) IVPB 3.375 g 3.375 g 12.5 mL/hr       Will continue  on : Zosyn   - await results of blood and urine culture  - Rehydrate aggressively  Intravenous fluids were administered,    8:01 PM   Mixed hyperlipidemia Hold statin in the setting of elevated LFTs  Coronary artery disease involving native  coronary artery of native heart without angina pectoris Chronic stable hold aspirin for now as may need surgical intervention.  Restart home medications when stable  Interstitial pulmonary disease [facility]) Patient followed as an outpatient with rheumatology and pulmonology.  Continue oxygen as needed  Type 2 diabetes mellitus with hyperglycemia, with long-term current use of insulin (HCC) Older sliding scale hold p.o. medications  Choledocholithiasis [facility] is aware will see patient in consult in the morning discussed with Dr. Deno Etienne will hold off on ordering MRCP for tonight until patient is evaluated by GI Continue Zosyn for possible sepsis (of note patient received second dose given 1.5 hours of a previous 1 but it was only partial pharmacy is aware will monitor)  Supportive management.  Pain control   Other plan as per orders.  DVT prophylaxis:  SCD    Code Status:    Code Status: Prior FULL CODE  as per patient   I had personally discussed CODE STATUS with patient and family    Family Communication:   Family not at  Bedside  plan of care was discussed on the phone with   Wife   Disposition Plan:        To home once workup is complete and patient is stable   Following barriers for discharge:                            Electrolytes corrected                                                        Will need consultants to evaluate patient prior to discharge     Consults called: [facility]  Admission status:  ED Disposition     ED Disposition  [place]: [facility] [[id number]]  Level of Care: Stepdown [14]  Admit to SDU based on following criteria: Severe physiological/psychological symptoms:  Any diagnosis requiring assessment & intervention at least every 4 hours on an ongoing basis to obtain desired patient outcomes including stability and rehabilitation  May [facility] to [facility] or [facility] if equivalent level  of care is available:: Yes  Interfacility transfer: Yes  Covid Evaluation: Confirmed COVID Negative  Diagnosis: Sepsis [facility]) [[id number]]  Admitting Physician: Eugenie Filler [place]  Attending Physician: Charlesetta Shanks [[id number]]  Certification:: I certify this patient will need inpatient services for at least 2 midnights  Estimated Length of Stay: 4  inpatient     I Expect 2 midnight stay secondary to severity of patient's current illness need for inpatient interventions justified by the following:  hemodynamic instability despite optimal treatment (tachycardia   tachypnea  )  Severe lab/radiological/exam abnormalities including:    Choledocholithiasis and extensive comorbidities including:   DM2    CAD  CKD   That are currently affecting medical management.   I expect  patient to be hospitalized for 2 midnights requiring inpatient medical care.  Patient is at high risk for adverse outcome (such as loss of life or disability) if not treated.  Indication for inpatient stay as follows:    Need for operative/procedural  intervention    Need for IV antibiotics, IV fluids,    Level of care   stepdown indefinitely please discontinue once patient no longer qualifies COVID-19 Labs    Lab Results  Component Value Date   [place] NEGATIVE 09/11/2022     Denecia Brunette 09/11/2022, 8:22 PM    [facility]     after 2 AM please page floor coverage PA If 7AM-7PM, please contact the day team taking care of the patient using [email or web address]   Patient was evaluated in the context of the global COVID-19 pandemic, which necessitated consideration that the patient might be at risk for infection with the SARS-CoV-2 virus that causes COVID-19. Institutional protocols and algorithms that pertain to the evaluation of patients at risk for COVID-19 are in a state of rapid change based on information released by regulatory bodies including the CDC and federal and state  organizations. These policies and algorithms were followed during the patient's care.

## 2022-09-11 NOTE — Progress Notes (Signed)
Pharmacy Antibiotic Note  Daylin Gruszka is a 87 y.o. male admitted on 09/11/2022 with  intra-abdominal infection .  Pharmacy has been consulted for Zosyn dosing.  Plan: Zosyn 3.375g IV q8h (each dose infused over 4 hours)  Need for further dosage adjustment appears unlikely at present, so pharmacy will sign off at this time.  Please reconsult if a change in clinical status warrants re-evaluation of dosage.     Height: '5\' 6"'$  (167.6 cm) Weight: 77 kg (169 lb 12.1 oz) IBW/kg (Calculated) : 63.8  Temp (24hrs), Avg:99 F (37.2 C), Min:97.8 F (36.6 C), Max:99.8 F (37.7 C)  Recent Labs  Lab 09/11/22 1150 09/11/22 1151  WBC  --  16.1*  CREATININE  --  1.29*  LATICACIDVEN 1.8  --     Estimated Creatinine Clearance: 41.7 mL/min (A) (by C-G formula based on SCr of 1.29 mg/dL (H)).    Allergies  Allergen Reactions   Ace Inhibitors Other (See Comments)    = Slow heart rate   Hydrocodone Nausea And Vomiting   Hydrocodone-Acetaminophen Nausea And Vomiting   Nsaids Other (See Comments)    Kidney issues   Sulfamethoxazole Nausea And Vomiting   Sulfamethoxazole-Trimethoprim Nausea And Vomiting   Trimethoprim Other (See Comments)    Reaction not recalled    Thank you for allowing pharmacy to be a part of this patient's care.  Luiz Ochoa 09/11/2022 7:41 PM

## 2022-09-11 NOTE — Assessment & Plan Note (Signed)
-  chronic avoid nephrotoxic medications such as NSAIDs, Vanco Zosyn combo,  avoid hypotension, continue to follow renal function  

## 2022-09-11 NOTE — ED Provider Notes (Signed)
[facility] Provider Note   CSN: [id number] Arrival date & time: 09/11/22  1121     History  Chief Complaint  Patient presents with   Chest Pain    Kerry Matthews is a 87 y.o. male.  Patient is an 87 year old male with a past medical history of interstitial lung disease on home O2 as needed, CAD, hypertension, diabetes, CKD presenting to the emergency department with chest pain.  Patient states that he woke up this morning initially feeling well however prior to eating breakfast started to develop a severe epigastric/chest burning type of pain.  He states that he took 2 Prilosec however had no improvement.  He states that he has had similar symptoms to this in the past which he attributed to acid reflux.  He states that he has some nausea but denies any vomiting.  He denies any fevers or chills or worsening shortness of breath from his baseline.  He denies any diarrhea or constipation, black or bloody stool.  The history is provided by the patient and the spouse.  Chest Pain      Home Medications Prior to Admission medications   Medication Sig Start Date End Date Taking? Authorizing Provider  Alcohol Swabs (B-D SINGLE USE SWABS REGULAR) PADS 1 each by Does not apply route in the morning, at noon, in the evening, and at bedtime. 12/25/21   [name], MD  aspirin EC 81 MG tablet Take 81 mg by mouth daily. Swallow whole.    [provider]  Coenzyme Q10 100 MG TABS Take 100 mg by mouth at bedtime.    [provider]  colchicine 0.6 MG tablet Take 1 tablet (0.6 mg total) by mouth 2 (two) times daily. With food 07/08/22   Cox, Kirsten, MD  furosemide (LASIX) 20 MG tablet TAKE 1 TABLET EVERY DAY AS NEEDED 04/11/22   Cox, Kirsten, MD  gabapentin (NEURONTIN) 400 MG capsule TAKE 1 CAPSULE THREE TIMES DAILY Patient taking differently: Take 400 mg by mouth 2 (two) times daily. Twice a day 03/17/22   Cox, Elnita Maxwell, MD  glucose blood (TRUE METRIX  BLOOD GLUCOSE TEST) test strip TEST BLOOD SUGAR THREE TIMES DAILY BEFORE MEALS 12/19/21   Cox, Elnita Maxwell, MD  metFORMIN (GLUCOPHAGE) 500 MG tablet Take 1 tablet by mouth once daily with breakfast 08/27/22   Cox, Elnita Maxwell, MD  Multiple Vitamins-Minerals (PRESERVISION AREDS 2) CAPS Take 1 capsule by mouth 2 (two) times daily after a meal.    [provider]  nitroGLYCERIN (NITROSTAT) 0.4 MG SL tablet Place 1 tablet (0.4 mg total) under the tongue every 5 (five) minutes as needed for chest pain. 03/13/22   Revankar, Reita Cliche, MD  omeprazole (PRILOSEC) 20 MG capsule TAKE 1 CAPSULE EVERY DAY 06/16/22   Cox, Kirsten, MD  OXYGEN Inhale 1 L into the lungs daily. Using as needed during the day and continuous at night.    [provider]  riTUXimab (RITUXAN) 500 MG/50ML injection '1000mg'$  Intravenous day 1 and day 15 then every 24 weeks for 14 days 06/18/22   [provider]  rosuvastatin (CRESTOR) 20 MG tablet TAKE 1 TABLET EVERY DAY 04/11/22   Cox, Kirsten, MD  vitamin B-12 (CYANOCOBALAMIN) 1000 MCG tablet Take 1 tablet (1,000 mcg total) by mouth daily. 04/06/19   Geradine Girt, DO      Allergies    Ace inhibitors, Hydrocodone, Hydrocodone-acetaminophen, Nsaids, Sulfamethoxazole, Sulfamethoxazole-trimethoprim, and Trimethoprim    Review of Systems   Review of Systems  Cardiovascular:  Positive for chest pain.    Physical Exam Updated Vital Signs BP 137/78   Pulse (!) 117   Temp 99 F (37.2 C) (Rectal)   Resp (!) 30   Ht '5\' 6"'$  (1.676 m)   Wt 78.9 kg   SpO2 99%   BMI 28.08 kg/m  Physical Exam Vitals and nursing note reviewed.  Constitutional:      General: He is not in acute distress.    Appearance: He is well-developed.     Comments: Uncomfortable appearing  HENT:     Head: Normocephalic and atraumatic.  Eyes:     Extraocular Movements: Extraocular movements intact.  Cardiovascular:     Rate and Rhythm: Normal rate and regular rhythm.     Pulses:          Radial  pulses are 2+ on the right side and 2+ on the left side.     Heart sounds: Normal heart sounds.  Pulmonary:     Effort: Pulmonary effort is normal.     Breath sounds: Normal breath sounds.  Chest:     Chest wall: No tenderness.  Abdominal:     Palpations: Abdomen is soft.     Tenderness: There is no abdominal tenderness.  Musculoskeletal:        General: Normal range of motion.     Cervical back: Normal range of motion and neck supple.     Right lower leg: No edema.     Left lower leg: No edema.  Skin:    General: Skin is warm and dry.  Neurological:     General: No focal deficit present.     Mental Status: He is alert and oriented to person, place, and time.     ED Results / Procedures / Treatments   Labs (all labs ordered are listed, but only abnormal results are displayed) Labs Reviewed  BASIC METABOLIC PANEL - Abnormal; Notable for the following components:      Result Value   Glucose, Bld 143 (*)    Creatinine, Ser 1.29 (*)    GFR, Estimated 55 (*)    All other components within normal limits  CBC - Abnormal; Notable for the following components:   WBC 16.1 (*)    All other components within normal limits  HEPATIC FUNCTION PANEL - Abnormal; Notable for the following components:   AST 274 (*)    ALT 194 (*)    Alkaline Phosphatase 437 (*)    Total Bilirubin 3.7 (*)    Bilirubin, Direct 2.2 (*)    Indirect Bilirubin 1.5 (*)    All other components within normal limits  LIPASE, BLOOD - Abnormal; Notable for the following components:   Lipase 653 (*)    All other components within normal limits  DIFFERENTIAL - Abnormal; Notable for the following components:   Neutro Abs 10.9 (*)    Monocytes Absolute 1.3 (*)    Eosinophils Absolute 0.6 (*)    All other components within normal limits  RESP PANEL BY RT-PCR (RSV, FLU A&B, COVID)  RVPGX2  CULTURE, BLOOD (ROUTINE X 2)  CULTURE, BLOOD (ROUTINE X 2)  LACTIC ACID, PLASMA  PROTIME-INR  URINALYSIS, ROUTINE W REFLEX  MICROSCOPIC  TROPONIN I (HIGH SENSITIVITY)  TROPONIN I (HIGH SENSITIVITY)    EKG EKG Interpretation  Date/Time:  Thursday September 11 2022 11:35:39 EST Ventricular Rate:  67 PR Interval:  193 QRS Duration: 93 QT Interval:  403 QTC Calculation: 426 R Axis:   -70 Text Interpretation: Sinus rhythm  Atrial premature complex Left anterior fascicular block Abnormal R-wave progression, late transition Interpretation limited secondary to artifact No significant change since last tracing Confirmed by Leanord Asal ([id number]) on 09/11/2022 12:00:08 PM  Radiology CT Angio Chest/Abd/Pel for Dissection W and/or Wo Contrast  Result Date: 09/11/2022 CLINICAL DATA:  Chest pain since Saturday, radiating to back, dyspnea on exertion, nausea, dizziness EXAM: CT ANGIOGRAPHY CHEST, ABDOMEN AND PELVIS TECHNIQUE: Non-contrast CT of the chest was initially obtained. Multidetector CT imaging through the chest, abdomen and pelvis was performed using the standard protocol during bolus administration of intravenous contrast. Multiplanar reconstructed images and MIPs were obtained and reviewed to evaluate the vascular anatomy. RADIATION DOSE REDUCTION: This exam was performed according to the departmental dose-optimization program which includes automated exposure control, adjustment of the mA and/or kV according to patient size and/or use of iterative reconstruction technique. CONTRAST:  144m OMNIPAQUE IOHEXOL 350 MG/ML SOLN COMPARISON:  01/28/2022, 09/11/2022 FINDINGS: CTA CHEST FINDINGS Cardiovascular: No evidence of thoracic aortic aneurysm or dissection. Atherosclerosis of the aortic arch is again noted. The heart is unremarkable without pericardial effusion. While not optimized for the opacification of the pulmonary vasculature, there is adequate opacification to exclude central and segmental pulmonary emboli. Mediastinum/Nodes: No enlarged mediastinal, hilar, or axillary lymph nodes. Thyroid gland, trachea, and  esophagus demonstrate no significant findings. Lungs/Pleura: Basilar predominant subpleural scarring and fibrosis unchanged. No acute airspace disease, effusion, or pneumothorax. The central airways are patent. Musculoskeletal: There are no acute displaced fractures. Reconstructed images demonstrate no additional findings. Review of the MIP images confirms the above findings. CTA ABDOMEN AND PELVIS FINDINGS VASCULAR Aorta: Normal caliber aorta without aneurysm, dissection, vasculitis or significant stenosis. Mild atherosclerosis. Celiac: Patent without evidence of aneurysm, dissection, vasculitis or significant stenosis. Mild ostial atherosclerosis without flow limiting stenosis. SMA: Patent without evidence of aneurysm, dissection, vasculitis or significant stenosis. Mild ostial atherosclerosis without flow limiting stenosis. Renals: There is moderate atherosclerosis at the origin of the bilateral renal arteries, right greater than left. Right renal artery stenosis estimated 50-70%, with left renal artery stenosis estimated 50%. No aneurysm, dissection, vasculitis, or evidence of fibromuscular dysplasia. IMA: Patent without evidence of aneurysm, dissection, vasculitis or significant stenosis. Inflow: Patent without evidence of aneurysm, dissection, vasculitis or significant stenosis. Veins: No obvious venous abnormality within the limitations of this arterial phase study. Review of the MIP images confirms the above findings. NON-VASCULAR Hepatobiliary: Respiratory motion limits evaluation. There are no focal liver abnormalities. Gallbladder is moderately distended without evidence of cholelithiasis or cholecystitis. Common bile duct measures up to 9 mm in diameter, which may be appropriate for age. Within the downstream common bile duct near the ampulla, reference image 100/6, there is a 10 mm hyperdense structure. This could reflect mucosal enhancement or choledocholithiasis. If biliary pathology is clinically  suspected, MRCP could be performed. Pancreas: Diffuse fatty atrophy of the pancreas. Spleen: Normal in size without focal abnormality. Adrenals/Urinary Tract: Adrenal glands are unremarkable. Kidneys are normal, without renal calculi, focal lesion, or hydronephrosis. Bladder is unremarkable. Stomach/Bowel: No bowel obstruction or ileus. Diverticulosis of the distal colon without diverticulitis. No bowel wall thickening or inflammatory change. Lymphatic: No pathologic adenopathy within the abdomen or pelvis. Reproductive: Prostate is unremarkable. Other: No free fluid or free intraperitoneal gas. There is a fat containing right inguinal hernia. No bowel herniation. Musculoskeletal: No acute or destructive bony lesions. Chronic appearing L3 compression deformity. Reconstructed images demonstrate no additional findings. Review of the MIP images confirms the above findings. IMPRESSION: 1. No evidence of thoracoabdominal  aortic aneurysm or dissection. 2. No evidence of pulmonary embolus. 3. High attenuation within the downstream common bile duct near the ampulla. This may reflect abnormal mucosal enhancement versus choledocholithiasis. Borderline dilation of the extrahepatic common bile duct. If further evaluation is desired, MRCP could be performed. 4. Distal colonic diverticulosis without diverticulitis. 5. Fat containing right inguinal hernia. 6. Aortic Atherosclerosis (ICD10-I70.0). Mild stenosis of the bilateral renal arteries at their origins, right greater than left. 7. Stable bibasilar parenchymal lung scarring and fibrosis. Electronically Signed   By: Randa Ngo M.D.   On: 09/11/2022 15:14   DG Chest 2 View  Result Date: 09/11/2022 CLINICAL DATA:  Chest pain EXAM: CHEST - 2 VIEW COMPARISON:  CT 01/28/2022.  X-ray 09/20/2021 and older FINDINGS: Stable cardiopericardial silhouette. No pneumothorax or effusion. Volume loss seen, underinflation with diffuse extensive interstitial changes noted bilaterally,  similar to previous. Likely a chronic process. Please correlate with prior CT scan for interstitial lung disease. Air-fluid level along the stomach beneath the left hemidiaphragm. Overlapping cardiac leads IMPRESSION: Low lung volumes with extensive interstitial changes, similar to previous. Please correlate for history of interstitial lung disease. No secondary consolidation at this time or effusion Electronically Signed   By: Jill Side M.D.   On: 09/11/2022 12:24    Procedures Procedures    Medications Ordered in ED Medications  lactated ringers infusion (has no administration in time range)  cefTRIAXone (ROCEPHIN) 2 g in sodium chloride 0.9 % 100 mL IVPB (has no administration in time range)  lactated ringers bolus 500 mL (has no administration in time range)  famotidine (PEPCID) IVPB 20 mg premix (0 mg Intravenous Stopped 09/11/22 1315)  alum & mag hydroxide-simeth (MAALOX/MYLANTA) 200-200-20 MG/5ML suspension 30 mL (30 mLs Oral Given 09/11/22 1246)  ondansetron (ZOFRAN) injection 4 mg (4 mg Intravenous Given 09/11/22 1243)  morphine (PF) 4 MG/ML injection 4 mg (4 mg Intravenous Given 09/11/22 1315)  iohexol (OMNIPAQUE) 350 MG/ML injection 100 mL (100 mLs Intravenous Contrast Given 09/11/22 1425)  lactated ringers bolus 1,000 mL (1,000 mLs Intravenous New Bag/Given 09/11/22 1513)    ED Course/ Medical Decision Making/ A&P Clinical Course as of 09/11/22 1530  Thu Sep 11, 2022  1301 Patient had no improvement of pain with GI cocktail. Due to severe pain radiating from abd to back, CT dissection study will be ordered. He was given morphine for pain. [VK]  1428 Working up concerning for pancreatitis vs biliary obstruction. CT dissection study pending for further evaluation. Initial troponin negative. [VK]  1518 Patient CT shows concern for choledocholithiasis.  Patient states that he sees Dr. Lyndel Safe with GI and we will consult on-call physician.  Patient was signed out to Dr. Vallery Ridge in stable  condition pending GI recommendations with plan for likely admission. [VK]    Clinical Course User Index [VK] Kemper Durie, DO                             Medical Decision Making This patient presents to the ED with chief complaint(s) of chest/epigastric pain with pertinent past medical history of ILD, CAD, HTN, DM, CKD which further complicates the presenting complaint. The complaint involves an extensive differential diagnosis and also carries with it a high risk of complications and morbidity.    The differential diagnosis includes ACS, arrhythmia, pulmonary edema, pleural effusion, pneumothorax, gastritis, GERD, pancreatitis, hepatitis, cholelithiasis or cholecystitis, dehydration, electrolyte abnormality, anemia  Additional history obtained: Additional history obtained from spouse  Records reviewed Primary Care Documents  ED Course and Reassessment: On patient's arrival to the emergency department EKG was performed that showed no acute ischemic changes.  The patient is uncomfortable appearing but in no acute distress.  Patient will have labs and chest x-ray performed.  He will be given a GI cocktail and will be closely reassessed.  Independent labs interpretation:  The following labs were independently interpreted: elevated lipase, LFTs, and bili concerning for biliary obstruction  Independent visualization of imaging: - I independently visualized the following imaging with scope of interpretation limited to determining acute life threatening conditions related to emergency care: CT dissection study, which revealed choledocholitiasis  Consultation: - Consulted or discussed management/test interpretation w/ external professional: GI      Amount and/or Complexity of Data Reviewed Labs: ordered. Radiology: ordered.  Risk OTC drugs. Prescription drug management.          Final Clinical Impression(s) / ED Diagnoses Final diagnoses:  Choledocholithiasis    Rx  / DC Orders ED Discharge Orders     None         Kemper Durie, DO 09/11/22 1530

## 2022-09-11 NOTE — ED Triage Notes (Addendum)
Chest pain since Saturday.  Went away after 2 hours.  Came back this am, now radiating to back.  Some sob on exertion.  Some nausea and dizziness.  No diaphoresis.  Pt took an extra prilosec today thinking it was heartburn but did not help.

## 2022-09-11 NOTE — Telephone Encounter (Signed)
Patients wife called to cancel the appointment for today. She is taking him to [facility].

## 2022-09-11 NOTE — ED Notes (Signed)
Called [facility] for transport spoke with [place]

## 2022-09-11 NOTE — Assessment & Plan Note (Signed)
Hold statin in the setting of elevated LFTs

## 2022-09-11 NOTE — Assessment & Plan Note (Signed)
[facility] is aware will see patient in consult in the morning discussed with Dr. Deno Etienne will hold off on ordering MRCP for tonight until patient is evaluated by GI Continue Zosyn for possible sepsis (of note patient received second dose given 1.5 hours of a previous 1 but it was only partial pharmacy is aware will monitor)  Supportive management.  Pain control

## 2022-09-11 NOTE — ED Notes (Signed)
Pt. Skin is yellow to greenish looking

## 2022-09-11 NOTE — Subjective & Objective (Signed)
Pt with epigastric pain radiating to the back starting this morning.  After eating breakfast.  He tried to use over-the-counter PPI but did not seem to help so he went to emergency department.  At ED CT scan showed evidence of choledocholithiasis patient will need further imaging [facility] has been notified Dr. Shelton Silvas will see patient in consult tomorrow. Patient was concerning for having sepsis started on Rocephin IV fluid given

## 2022-09-11 NOTE — ED Notes (Signed)
Patient denies pain and is resting comfortably.  

## 2022-09-11 NOTE — Assessment & Plan Note (Signed)
-  SIRS criteria met with  elevated white blood cell count,       Component Value Date/Time   WBC 16.1 (H) 09/11/2022 1151   LYMPHSABS 3.2 09/11/2022 1400   LYMPHSABS 4.6 (H) 07/08/2022 0910     tachycardia     RR >20 Today's Vitals   09/11/22 1738 09/11/22 1744 09/11/22 1800 09/11/22 1900  BP:    (!) 103/57  Pulse: 93   67  Resp: (!) 31   (!) 27  Temp: 99.3 F (37.4 C)     TempSrc: Oral     SpO2: 93%   96%  Weight:   77 kg   Height:   '5\' 6"'$  (1.676 m)   PainSc:  0-No pain       The recent clinical data is shown below. Vitals:   09/11/22 1700 09/11/22 1738 09/11/22 1800 09/11/22 1900  BP: 101/64   (!) 103/57  Pulse: 100 93  67  Resp: (!) 29 (!) 31  (!) 27  Temp:  99.3 F (37.4 C)    TempSrc:  Oral    SpO2: 97% 93%  96%  Weight:   77 kg   Height:   '5\' 6"'$  (1.676 m)     Source -Most likely source being , intra-abdominal,    Patient meeting criteria for Severe sepsis with    evidence of end organ damage/organ dysfunction such as      Acute hypoxia requiring new supplemental oxygen, SpO2: 96 % O2 Flow Rate (L/min): 2 L/min      - Obtain serial lactic acid and procalcitonin level.  - Initiated IV antibiotics in ER: Antibiotics Given (last 72 hours)     Date/Time Action Medication Dose Rate   09/11/22 1627 New Bag/Given   cefTRIAXone (ROCEPHIN) 2 g in sodium chloride 0.9 % 100 mL IVPB 2 g 200 mL/hr   09/11/22 1703 New Bag/Given   piperacillin-tazobactam (ZOSYN) IVPB 3.375 g 3.375 g 100 mL/hr   09/11/22 1848 New Bag/Given   piperacillin-tazobactam (ZOSYN) IVPB 3.375 g 3.375 g 12.5 mL/hr       Will continue  on : Zosyn   - await results of blood and urine culture  - Rehydrate aggressively  Intravenous fluids were administered,    8:01 PM

## 2022-09-11 NOTE — Assessment & Plan Note (Signed)
Chronic stable hold aspirin for now as may need surgical intervention.  Restart home medications when stable

## 2022-09-11 NOTE — ED Notes (Signed)
Patient transported to X-ray 

## 2022-09-12 ENCOUNTER — Inpatient Hospital Stay (HOSPITAL_COMMUNITY): Payer: Medicare HMO

## 2022-09-12 DIAGNOSIS — E1165 Type 2 diabetes mellitus with hyperglycemia: Secondary | ICD-10-CM

## 2022-09-12 DIAGNOSIS — N1831 Chronic kidney disease, stage 3a: Secondary | ICD-10-CM

## 2022-09-12 DIAGNOSIS — R17 Unspecified jaundice: Secondary | ICD-10-CM

## 2022-09-12 DIAGNOSIS — Z794 Long term (current) use of insulin: Secondary | ICD-10-CM

## 2022-09-12 DIAGNOSIS — K851 Biliary acute pancreatitis without necrosis or infection: Secondary | ICD-10-CM | POA: Diagnosis present

## 2022-09-12 DIAGNOSIS — A419 Sepsis, unspecified organism: Secondary | ICD-10-CM | POA: Diagnosis not present

## 2022-09-12 DIAGNOSIS — K8050 Calculus of bile duct without cholangitis or cholecystitis without obstruction: Secondary | ICD-10-CM | POA: Diagnosis not present

## 2022-09-12 HISTORY — DX: Biliary acute pancreatitis without necrosis or infection: K85.10

## 2022-09-12 LAB — PROCALCITONIN: Procalcitonin: 2.54 ng/mL

## 2022-09-12 LAB — GLUCOSE, CAPILLARY
Glucose-Capillary: 107 mg/dL — ABNORMAL HIGH (ref 70–99)
Glucose-Capillary: 104 mg/dL — ABNORMAL HIGH (ref 70–99)
Glucose-Capillary: 101 mg/dL — ABNORMAL HIGH (ref 70–99)
Glucose-Capillary: 93 mg/dL (ref 70–99)
Glucose-Capillary: 88 mg/dL (ref 70–99)
Glucose-Capillary: 77 mg/dL (ref 70–99)
Glucose-Capillary: 182 mg/dL — ABNORMAL HIGH (ref 70–99)

## 2022-09-12 LAB — CBC
HCT: 39 % (ref 39.0–52.0)
Hemoglobin: 12.8 g/dL — ABNORMAL LOW (ref 13.0–17.0)
MCH: 28.5 pg (ref 26.0–34.0)
MCHC: 32.8 g/dL (ref 30.0–36.0)
MCV: 86.9 fL (ref 80.0–100.0)
Platelets: 196 10*3/uL (ref 150–400)
RBC: 4.49 MIL/uL (ref 4.22–5.81)
RDW: 13.9 % (ref 11.5–15.5)
WBC: 10.9 10*3/uL — ABNORMAL HIGH (ref 4.0–10.5)
nRBC: 0 % (ref 0.0–0.2)

## 2022-09-12 LAB — COMPREHENSIVE METABOLIC PANEL
ALT: 137 U/L — ABNORMAL HIGH (ref 0–44)
AST: 136 U/L — ABNORMAL HIGH (ref 15–41)
Albumin: 2.9 g/dL — ABNORMAL LOW (ref 3.5–5.0)
Alkaline Phosphatase: 357 U/L — ABNORMAL HIGH (ref 38–126)
Anion gap: 7 (ref 5–15)
BUN: 15 mg/dL (ref 8–23)
CO2: 21 mmol/L — ABNORMAL LOW (ref 22–32)
Calcium: 7.8 mg/dL — ABNORMAL LOW (ref 8.9–10.3)
Chloride: 106 mmol/L (ref 98–111)
Creatinine, Ser: 0.98 mg/dL (ref 0.61–1.24)
GFR, Estimated: >60 mL/min (ref >60)
Glucose, Bld: 103 mg/dL — ABNORMAL HIGH (ref 70–99)
Potassium: 4.1 mmol/L (ref 3.5–5.1)
Sodium: 134 mmol/L — ABNORMAL LOW (ref 135–145)
Total Bilirubin: 5.3 mg/dL — ABNORMAL HIGH (ref 0.3–1.2)
Total Protein: 5.5 g/dL — ABNORMAL LOW (ref 6.5–8.1)

## 2022-09-12 LAB — PHOSPHORUS: Phosphorus: 3.6 mg/dL (ref 2.5–4.6)

## 2022-09-12 LAB — MAGNESIUM: Magnesium: 1.9 mg/dL (ref 1.7–2.4)

## 2022-09-12 LAB — LIPASE, BLOOD: Lipase: 91 U/L — ABNORMAL HIGH (ref 11–51)

## 2022-09-12 MED ORDER — SODIUM CHLORIDE 0.9 % IV SOLN: INTRAVENOUS | Status: DC

## 2022-09-12 MED ORDER — CHOLESTYRAMINE LIGHT 4 G PO PACK
4.0000 g | PACK | Freq: Once | ORAL | Status: AC
  Administered 2022-09-12: 4 g via ORAL
  Filled 2022-09-12: qty 1

## 2022-09-12 MED ORDER — LUBRIDERM SERIOUSLY SENSITIVE EX LOTN
TOPICAL_LOTION | CUTANEOUS | Status: DC | PRN
  Administered 2022-09-12: 1 via TOPICAL
  Filled 2022-09-12: qty 562

## 2022-09-12 NOTE — Consult Note (Signed)
Consultation  Referring Provider:      Primary Care Physician:  [name], MD Primary Gastroenterologist:         Reason for Consultation:          Impression / Plan:   Clinical scenario compatible with choledocholithiasis.  Elevated lipase probably from that and not pancreatitis though cannot rule out component of that.    I am going to check a right upper quadrant ultrasound for reassessment.  At the time of this dictation radiology is pushing back on this because he has had sips with meds.  I think we could get useful information and hopefully they will do that.  Anticipating an ERCP with stone removal tomorrow.  Depending upon clinical course and labs etc. could shift to an MRCP though I think he is going to need the ERCP given jaundice and the available information.   I have explained the risks benefits and indication of the procedure and my plan to the patient and his wife and daughter.  Risks of bleeding infection reactions to medication potential pancreatitis and even need for surgery related to the procedure are explained.  He may need to be considered for cholecystectomy depending upon additional information obtained from workup.   Gatha Mayer, MD, [facility] See Shea Evans on call - gastroenterology for best contact person 09/12/2022 1:30 PM           HPI:   Kerry Matthews is a 87 y.o. 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  LFTs are elevated as well as lipase.  After the initial presenting intense pain he has been okay without any pain at this  point.  He has not been shocky or febrile.  He is jaundiced.  There has been some antecedent milder epigastric pain issues.  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  ANCA antibodies- myeloperoxidase ABS-greater than 100, ANCA proteinase 3-less than 3.5 04/02/2019- ANCA titers- p-ANCA +1: 160, C ANCA-less than 1: 20, atypical p-ANCA titer-less than 1: 20   Abnormal CT of the chest    Abnormal findings on diagnostic imaging of lung 04/12/2019   04/01/2019-CT chest with contrast- no acute process in chest abdomen or pelvis, interstitial lung disease suspicious for early or mild UIP, pulmonary artery enlargement suggest PAH    Acute pain of left knee 11/30/2019   Alpha-1-antitrypsin deficiency carrier 05/04/2019   Atherosclerotic heart disease of native coronary artery without angina pectoris 05/03/2018   Body mass index (BMI) 29.0-29.9, adult 01/05/2020   Bradycardia    Chronic kidney disease, stage 3a ([place]) 08/12/2021   CKD (chronic kidney disease) 05/03/2018   Coronary artery disease    Coronary artery disease involving native coronary artery of native heart without angina pectoris 05/03/2018   Diabetes mellitus type 2 in obese ([place]) 04/01/2019   Diabetic polyneuropathy associated with type 2 diabetes mellitus ([place]) 08/12/2021   Dyslipidemia associated with type 2 diabetes mellitus ([place]) 05/03/2018   Elevated rheumatoid factor 04/12/2019   04/03/2019-rheumatoid factor-95.4   Essential hypertension 05/03/2018   GAD (generalized anxiety disorder)    GERD without esophagitis 05/03/2018   Hematuria 09/07/2020  History of kidney stones    Hypertensive heart disease without heart failure 05/03/2018   Hyponatremia 08/12/2021   Idiopathic pulmonary fibrosis ([place]) 03/29/2021   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   Insomnia 05/03/2018   Interstitial pulmonary disease ([place]) 04/12/2019   04/01/2019-CT chest with contrast- no acute process in chest  abdomen or pelvis, interstitial lung disease suspicious for early or mild UIP, pulmonary artery enlargement suggest PAH  04/02/2019-connective tissue work-up: Anti-Jo 1-negative Anti-DNA antibody double-stranded-negative Anti-scleroderma antibody-negative Sjogren's syndrome antibody-negative Sjogren's syndrome antibody-negative CK-31 CCP-6 E   Leukocytosis 03/08/2020   Low vitamin B12 level 04/03/2019   Lower extremity edema    Medicare annual wellness visit, subsequent 08/09/2020   Microscopic polyangiitis ([place]) 03/29/2021   Mixed diabetic hyperlipidemia associated with type 2 diabetes mellitus ([place]) 08/12/2021   Mixed hyperlipidemia 05/03/2018   Myalgia 04/19/2019   Osteoarthritis    Other emphysema ([place])    Other long term (current) drug therapy 03/29/2021   Peripheral polyneuropathy 01/30/2020   Polymyositis ([place])    Primary insomnia    Proteinuria 09/07/2020   Renal insufficiency 09/06/2020   Respiratory disorder concurrent with and due to microscopic polyangiitis ([place]) 08/12/2021   Rheumatoid factor positive 03/29/2021   Sepsis ([place]) 04/19/2019   Skin cancer    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/17/2020   Transaminitis    Trigger finger 05/03/2018   Type 2 diabetes mellitus with hyperglycemia, with long-term current use of insulin ([place]) 04/23/2021   UC (ulcerative colitis) ([place])    UTI (urinary tract infection)    Vasculitis ([place]) 03/29/2021    Past Surgical History:  Procedure Laterality Date   ANGIOPLASTY  2010   BACK SURGERY     CATARACT EXTRACTION     COLONOSCOPY  06/16/2005   Mild colitis involving splenic flexure. Colonic polyps, status post polypectomy. Mild pancolonic diverticulitits. Internal hemorrhoids.    ESOPHAGOGASTRODUODENOSCOPY  04/26/2003   Irregular Z line suggestive of GERD. Mild gastritis status post CLO testing.    TOTAL KNEE ARTHROPLASTY Left 12/17/2020   Procedure: LEFT TOTAL KNEE ARTHROPLASTY;  Surgeon: Leandrew Koyanagi, MD;  Location: [facility];   Service: Orthopedics;  Laterality: Left;   TRIGGER FINGER RELEASE      Family History  Problem Relation Age of Onset   Tuberculosis Mother    Stroke Father    Pancreatic cancer Sister    Heart attack Sister    Lung disease Sister    Clotting disorder Brother    Colon cancer Neg Hx    Esophageal cancer Neg Hx     Social History   Tobacco Use   Smoking status: Never   Smokeless tobacco: Never  Vaping Use   Vaping Use: Never used  Substance Use Topics   Alcohol use: Never   Drug use: Never    Prior to Admission medications   Medication Sig Start Date End Date Taking? Authorizing Provider  Alcohol Swabs (B-D SINGLE USE SWABS REGULAR) PADS 1 each by Does not apply route in the morning, at noon, in the evening, and at bedtime. 12/25/21   [name], MD  aspirin EC 81 MG tablet Take 81 mg by mouth daily. Swallow whole.    [provider]  Coenzyme Q10 100 MG TABS Take 100 mg by mouth at bedtime.    [provider]  colchicine 0.6 MG tablet Take 1 tablet (0.6 mg total) by mouth 2 (two) times daily. With food 07/08/22   Cox, Elnita Maxwell, MD  furosemide (LASIX) 20 MG tablet TAKE  1 TABLET EVERY DAY AS NEEDED 04/11/22   Cox, Elnita Maxwell, MD  gabapentin (NEURONTIN) 400 MG capsule TAKE 1 CAPSULE THREE TIMES DAILY Patient taking differently: Take 400 mg by mouth 2 (two) times daily. Twice a day 03/17/22   Cox, Elnita Maxwell, MD  glucose blood (TRUE METRIX BLOOD GLUCOSE TEST) test strip TEST BLOOD SUGAR THREE TIMES DAILY BEFORE MEALS 12/19/21   Cox, Elnita Maxwell, MD  metFORMIN (GLUCOPHAGE) 500 MG tablet Take 1 tablet by mouth once daily with breakfast 08/27/22   Cox, Elnita Maxwell, MD  Multiple Vitamins-Minerals (PRESERVISION AREDS 2) CAPS Take 1 capsule by mouth 2 (two) times daily after a meal.    [provider]  nitroGLYCERIN (NITROSTAT) 0.4 MG SL tablet Place 1 tablet (0.4 mg total) under the tongue every 5 (five) minutes as needed for chest pain. 03/13/22   Revankar, Reita Cliche, MD   omeprazole (PRILOSEC) 20 MG capsule TAKE 1 CAPSULE EVERY DAY 06/16/22   Cox, Kirsten, MD  OXYGEN Inhale 1 L into the lungs daily. Using as needed during the day and continuous at night.    [provider]  riTUXimab (RITUXAN) 500 MG/50ML injection '1000mg'$  Intravenous day 1 and day 15 then every 24 weeks for 14 days 06/18/22   [provider]  rosuvastatin (CRESTOR) 20 MG tablet TAKE 1 TABLET EVERY DAY 04/11/22   Cox, Kirsten, MD  vitamin B-12 (CYANOCOBALAMIN) 1000 MCG tablet Take 1 tablet (1,000 mcg total) by mouth daily. 04/06/19   Geradine Girt, DO    Current Facility-Administered Medications  Medication Dose Route Frequency Provider Last Rate Last Admin   0.9 %  sodium chloride infusion   Intravenous Continuous Eugenie Filler, MD 75 mL/hr at 09/12/22 1047 Infusion Verify at 09/12/22 1047   acetaminophen (TYLENOL) tablet 650 mg  650 mg Oral Q6H PRN Toy Baker, MD       Or   acetaminophen (TYLENOL) suppository 650 mg  650 mg Rectal Q6H PRN Doutova, Anastassia, MD       Chlorhexidine Gluconate Cloth 2 % PADS 6 each  6 each Topical Daily Doutova, Anastassia, MD   6 each at 09/11/22 1842   fentaNYL (SUBLIMAZE) injection 12.5-50 mcg  12.5-50 mcg Intravenous Q2H PRN Doutova, Nyoka Lint, MD       gabapentin (NEURONTIN) capsule 400 mg  400 mg Oral BID Toy Baker, MD   400 mg at 09/12/22 0906   insulin aspart (novoLOG) injection 0-9 Units  0-9 Units Subcutaneous Q4H Doutova, Anastassia, MD       lactated ringers infusion   Intravenous Continuous Toy Baker, MD   Stopped at 09/11/22 2005   lubriderm seriously sensitive lotion   Topical PRN Eugenie Filler, MD       Oral care mouth rinse  15 mL Mouth Rinse PRN Roel Cluck, Anastassia, MD       piperacillin-tazobactam (ZOSYN) IVPB 3.375 g  3.375 g Intravenous Q8H Doutova, Anastassia, MD 12.5 mL/hr at 09/12/22 1150 3.375 g at 09/12/22 1150    Allergies as of 09/11/2022 - Review Complete 09/11/2022   Allergen Reaction Noted   Ace inhibitors Other (See Comments) 05/03/2018   Hydrocodone Nausea And Vomiting 12/28/2015   Hydrocodone-acetaminophen Nausea And Vomiting 05/03/2018   Nsaids Other (See Comments) 12/06/2020   Sulfamethoxazole Nausea And Vomiting 08/12/2021   Sulfamethoxazole-trimethoprim Nausea And Vomiting 06/30/2016   Trimethoprim Other (See Comments) 08/12/2021     Review of Systems:    This is positive for those things mentioned in the HPI. All other review of systems are  negative.       Physical Exam:  Vital signs in last 24 hours: Temp:  [97.6 F (36.4 C)-99.8 F (37.7 C)] 97.9 F (36.6 C) (01/19 1158) Pulse Rate:  [53-122] 61 (01/19 1153) Resp:  [14-31] 30 (01/19 1153) BP: (99-154)/(41-86) 106/41 (01/19 1104) SpO2:  [93 %-100 %] 95 % (01/19 1153) Weight:  [77 kg] 77 kg (01/18 1800) Last BM Date : 09/11/22  General:  Well-developed, elderly white man jaundiced, well-nourished and in no acute distress Eyes:  icteric. Lungs: Clear to auscultation bilaterally. Heart:   S1S2, no rubs, murmurs, gallops. Abdomen:  soft, non-tender, no hepatosplenomegaly,  or mass and BS+.  Extremities:   no edema Skin  jaundiced Neuro:  A&O x 3.  Psych:  appropriate mood and  Affect.   Data Reviewed:   LAB RESULTS: Recent Labs    09/11/22 1151 09/12/22 0242  WBC 16.1* 10.9*  HGB 15.1 12.8*  HCT 45.2 39.0  PLT 249 196   BMET Recent Labs    09/11/22 1151 09/11/22 2018 09/12/22 0242  NA 135 132* 134*  K 4.1 4.3 4.1  CL 101 100 106  CO2 23 22 21*  GLUCOSE 143* 129* 103*  BUN '18 20 15  '$ CREATININE 1.29* 1.15 0.98  CALCIUM 8.9 8.2* 7.8*   LFT Recent Labs    09/11/22 1151 09/11/22 2018 09/12/22 0242  PROT 7.4   < > 5.5*  ALBUMIN 3.9   < > 2.9*  AST 274*   < > 136*  ALT 194*   < > 137*  ALKPHOS 437*   < > 357*  BILITOT 3.7*   < > 5.3*  BILIDIR 2.2*  --   --   IBILI 1.5*  --   --    < > = values in this interval not displayed.   PT/INR Recent  Labs    09/11/22 1150  LABPROT 13.8  INR 1.1    STUDIES: Images personally reviewed CT Angio Chest/Abd/Pel for Dissection W and/or Wo Contrast  Result Date: 09/11/2022 CLINICAL DATA:  Chest pain since Saturday, radiating to back, dyspnea on exertion, nausea, dizziness EXAM: CT ANGIOGRAPHY CHEST, ABDOMEN AND PELVIS TECHNIQUE: Non-contrast CT of the chest was initially obtained. Multidetector CT imaging through the chest, abdomen and pelvis was performed using the standard protocol during bolus administration of intravenous contrast. Multiplanar reconstructed images and MIPs were obtained and reviewed to evaluate the vascular anatomy. RADIATION DOSE REDUCTION: This exam was performed according to the departmental dose-optimization program which includes automated exposure control, adjustment of the mA and/or kV according to patient size and/or use of iterative reconstruction technique. CONTRAST:  147m OMNIPAQUE IOHEXOL 350 MG/ML SOLN COMPARISON:  01/28/2022, 09/11/2022 FINDINGS: CTA CHEST FINDINGS Cardiovascular: No evidence of thoracic aortic aneurysm or dissection. Atherosclerosis of the aortic arch is again noted. The heart is unremarkable without pericardial effusion. While not optimized for the opacification of the pulmonary vasculature, there is adequate opacification to exclude central and segmental pulmonary emboli. Mediastinum/Nodes: No enlarged mediastinal, hilar, or axillary lymph nodes. Thyroid gland, trachea, and esophagus demonstrate no significant findings. Lungs/Pleura: Basilar predominant subpleural scarring and fibrosis unchanged. No acute airspace disease, effusion, or pneumothorax. The central airways are patent. Musculoskeletal: There are no acute displaced fractures. Reconstructed images demonstrate no additional findings. Review of the MIP images confirms the above findings. CTA ABDOMEN AND PELVIS FINDINGS VASCULAR Aorta: Normal caliber aorta without aneurysm, dissection, vasculitis  or significant stenosis. Mild atherosclerosis. Celiac: Patent without evidence of aneurysm, dissection, vasculitis or significant  stenosis. Mild ostial atherosclerosis without flow limiting stenosis. SMA: Patent without evidence of aneurysm, dissection, vasculitis or significant stenosis. Mild ostial atherosclerosis without flow limiting stenosis. Renals: There is moderate atherosclerosis at the origin of the bilateral renal arteries, right greater than left. Right renal artery stenosis estimated 50-70%, with left renal artery stenosis estimated 50%. No aneurysm, dissection, vasculitis, or evidence of fibromuscular dysplasia. IMA: Patent without evidence of aneurysm, dissection, vasculitis or significant stenosis. Inflow: Patent without evidence of aneurysm, dissection, vasculitis or significant stenosis. Veins: No obvious venous abnormality within the limitations of this arterial phase study. Review of the MIP images confirms the above findings. NON-VASCULAR Hepatobiliary: Respiratory motion limits evaluation. There are no focal liver abnormalities. Gallbladder is moderately distended without evidence of cholelithiasis or cholecystitis. Common bile duct measures up to 9 mm in diameter, which may be appropriate for age. Within the downstream common bile duct near the ampulla, reference image 100/6, there is a 10 mm hyperdense structure. This could reflect mucosal enhancement or choledocholithiasis. If biliary pathology is clinically suspected, MRCP could be performed. Pancreas: Diffuse fatty atrophy of the pancreas. Spleen: Normal in size without focal abnormality. Adrenals/Urinary Tract: Adrenal glands are unremarkable. Kidneys are normal, without renal calculi, focal lesion, or hydronephrosis. Bladder is unremarkable. Stomach/Bowel: No bowel obstruction or ileus. Diverticulosis of the distal colon without diverticulitis. No bowel wall thickening or inflammatory change. Lymphatic: No pathologic adenopathy within  the abdomen or pelvis. Reproductive: Prostate is unremarkable. Other: No free fluid or free intraperitoneal gas. There is a fat containing right inguinal hernia. No bowel herniation. Musculoskeletal: No acute or destructive bony lesions. Chronic appearing L3 compression deformity. Reconstructed images demonstrate no additional findings. Review of the MIP images confirms the above findings. IMPRESSION: 1. No evidence of thoracoabdominal aortic aneurysm or dissection. 2. No evidence of pulmonary embolus. 3. High attenuation within the downstream common bile duct near the ampulla. This may reflect abnormal mucosal enhancement versus choledocholithiasis. Borderline dilation of the extrahepatic common bile duct. If further evaluation is desired, MRCP could be performed. 4. Distal colonic diverticulosis without diverticulitis. 5. Fat containing right inguinal hernia. 6. Aortic Atherosclerosis (ICD10-I70.0). Mild stenosis of the bilateral renal arteries at their origins, right greater than left. 7. Stable bibasilar parenchymal lung scarring and fibrosis. Electronically Signed   By: Randa Ngo M.D.   On: 09/11/2022 15:14   DG Chest 2 View  Result Date: 09/11/2022 CLINICAL DATA:  Chest pain EXAM: CHEST - 2 VIEW COMPARISON:  CT 01/28/2022.  X-ray 09/20/2021 and older FINDINGS: Stable cardiopericardial silhouette. No pneumothorax or effusion. Volume loss seen, underinflation with diffuse extensive interstitial changes noted bilaterally, similar to previous. Likely a chronic process. Please correlate with prior CT scan for interstitial lung disease. Air-fluid level along the stomach beneath the left hemidiaphragm. Overlapping cardiac leads IMPRESSION: Low lung volumes with extensive interstitial changes, similar to previous. Please correlate for history of interstitial lung disease. No secondary consolidation at this time or effusion Electronically Signed   By: Jill Side M.D.   On: 09/11/2022 12:24         Thanks   LOS: 1 day   '@Fredda Clarida'$  Simonne Maffucci, MD, [facility] @  09/12/2022, 1:28 PM

## 2022-09-12 NOTE — Progress Notes (Signed)
PROGRESS NOTE    Kerry Matthews  LTJ:[id number] DOB: 1936/08/22 DOA: 09/11/2022 PCP: [name], MD    Chief Complaint  Patient presents with   Chest Pain    Brief Narrative:  Patient is a pleasant 87 year old gentleman, history of pulmonary fibrosis/interstitial lung disease, alpha-1 antitrypsin deficiency, emphysema, connective tissue disease, diabetes, hypertension, CAD, CKD, microscopic polyangiitis presenting with sudden onset epigastric chest pain radiating to his back started on the morning of admission after eating his breakfast.  Patient seen in the ED CT chest abdomen and pelvis done concerning for choledocholithiasis.  Labs obtained in the ED with a transaminitis, elevated lipase levels in the 600s.  Patient placed empirically on IV fluids, IV antibiotics in the ED, noted to have elevated leukocytosis and noted to have been developing a tachycardia tachypnea concern for early developing sepsis and admitted to the hospital for further evaluation.  ED physician spoke with GI who had recommended admission and need for possible ERCP.  Patient admitted to stepdown unit.   Assessment & Plan:   Principal Problem:   Sepsis ([place]) Active Problems:   Choledocholithiasis   Acute gallstone pancreatitis   Chronic kidney disease, stage 3a (HCC)   Diabetic polyneuropathy associated with type 2 diabetes mellitus ([place])   Mixed hyperlipidemia   Coronary artery disease involving native coronary artery of native heart without angina pectoris   Interstitial pulmonary disease (HCC)   Type 2 diabetes mellitus with hyperglycemia, with long-term current use of insulin ([place])  #1 probable early evolving sepsis -Patient met criteria for sepsis on admission due to tachycardia, tachypnea, leukocytosis, patient had presented with concerns for choledocholithiasis with sudden onset epigastric abdominal pain radiating to the back, significantly elevated lipase levels concerning for gallstone pancreatitis  with transaminitis. -Patient pancultured, blood cultures pending, urinalysis done unremarkable. -Patient placed empirically on IV Zosyn. -GI consultation pending.  2.  Choledocholithiasis/acute gallstone pancreatitis -Patient had presented with sudden onset epigastric abdominal pain with radiation to the back.  Patient noted to have a transaminitis, lipase levels elevated at 653 on admission with significant decrease to 91 today.??  Passed gallstones. -CT gram chest/abdomen and pelvis with concerns for choledocholithiasis, distal colonic diverticulosis without diverticulitis, fat-containing right inguinal hernia, no evidence of pulmonary embolus, no evidence of thoracoabdominal aortic aneurysm or dissection. -Patient jaundiced on examination. -Patient with a transaminitis on presentation which seems to be slowly trending down. -GI consulted right upper quadrant ultrasound obtained with no evidence of biliary obstruction. -GI recommending ERCP hopefully to be done tomorrow. -Continue empiric IV Zosyn. -May need laparoscopic cholecystectomy during this hospitalization after workup is completed however will defer to GI for timing of surgical input.  3.  CKD stage IIIa -Renal function stable.  4.  Hyperlipidemia -Continue to hold statin as patient with a transaminitis.  5.  Interstitial pulmonary disease -Patient with diffuse fine crackles. -Chest x-ray no significant change from prior chest x-ray. -Patient on IV fluids, monitor closely for volume overload. -Pulmonary toileting. -Outpatient follow-up with primary pulmonologist and rheumatologist.  6.  Type 2 diabetes with hyperglycemia -Hemoglobin A1c 6.1 (07/08/2022) -Hold home regimen oral hypoglycemic agents. -SSI. -Resume home regimen Neurontin.  7.  CAD -Stable. -Continue to hold aspirin, statin.   DVT prophylaxis: SCDs Code Status: Full Family Communication: Updated patient, wife, daughter at bedside. Disposition: Likely  home when clinically improved and cleared by GI.  Status is: Inpatient Remains inpatient appropriate because: Severity of illness   Consultants:  Gastroenterology  Procedures:  CT angiogram chest abdomen and pelvis 09/11/2022  Chest x-ray 09/11/2022 Right upper quadrant ultrasound pending from 09/12/2022  Antimicrobials:  IV Zosyn 09/11/2022>>>>>>   Subjective: Patient sitting up in bed.  Overall states he is feeling better.  Denies any acute abdominal pain.  Denies any chest pain.  No significant shortness of breath.  Wife and daughter at bedside.  Objective: Vitals:   09/12/22 1300 09/12/22 1343 09/12/22 1400 09/12/22 1506  BP: 109/63  (!) 119/55 (!) 115/57  Pulse: 60 76 74 80  Resp: '18 12 17 11  '$ Temp:      TempSrc:      SpO2: 95% 93% 99% 100%  Weight:      Height:        Intake/Output Summary (Last 24 hours) at 09/12/2022 1702 Last data filed at 09/12/2022 1600 Gross per 24 hour  Intake 2048.88 ml  Output --  Net 2048.88 ml   Filed Weights   09/11/22 1135 09/11/22 1800  Weight: 78.9 kg 77 kg    Examination:  General exam: Slightly jaundiced.  Appears calm and comfortable  Respiratory system: Diffuse fine crackles.  No wheezing.  No rhonchi.  Fair air movement.  Speaking in full sentences.  Cardiovascular system: S1 & S2 heard, RRR. No JVD, murmurs, rubs, gallops or clicks. No pedal edema. Gastrointestinal system: Abdomen is nondistended, soft and nontender. No organomegaly or masses felt. Normal bowel sounds heard. Central nervous system: Alert and oriented. No focal neurological deficits. Extremities: Symmetric 5 x 5 power. Skin: Jaundiced.  Psychiatry: Judgement and insight appear normal. Mood & affect appropriate.     Data Reviewed: I have personally reviewed following labs and imaging studies  CBC: Recent Labs  Lab 09/11/22 1151 09/11/22 1400 09/12/22 0242  WBC 16.1*  --  10.9*  NEUTROABS  --  10.9*  --   HGB 15.1  --  12.8*  HCT 45.2  --  39.0   MCV 86.9  --  86.9  PLT 249  --  774    Basic Metabolic Panel: Recent Labs  Lab 09/11/22 1151 09/11/22 2018 09/12/22 0242  NA 135 132* 134*  K 4.1 4.3 4.1  CL 101 100 106  CO2 23 22 21*  GLUCOSE 143* 129* 103*  BUN '18 20 15  '$ CREATININE 1.29* 1.15 0.98  CALCIUM 8.9 8.2* 7.8*  MG  --  2.1 1.9  PHOS  --  3.8 3.6    GFR: Estimated Creatinine Clearance: 54.8 mL/min (by C-G formula based on SCr of 0.98 mg/dL).  Liver Function Tests: Recent Labs  Lab 09/11/22 1151 09/11/22 2018 09/12/22 0242  AST 274* 163* 136*  ALT 194* 154* 137*  ALKPHOS 437* 381* 357*  BILITOT 3.7* 4.5* 5.3*  PROT 7.4 5.9* 5.5*  ALBUMIN 3.9 3.1* 2.9*    CBG: Recent Labs  Lab 09/11/22 2316 09/12/22 0308 09/12/22 0737 09/12/22 1135 09/12/22 1634  GLUCAP 104* 101* 93 88 77     Recent Results (from the past 240 hour(s))  Resp panel by RT-PCR (RSV, Flu A&B, Covid) Nasopharyngeal Swab     Status: None   Collection Time: 09/11/22 11:51 AM   Specimen: Nasopharyngeal Swab; Nasal Swab  Result Value Ref Range Status   SARS Coronavirus 2 by RT PCR NEGATIVE NEGATIVE Final    Comment: (NOTE) SARS-CoV-2 target nucleic acids are NOT DETECTED.  The SARS-CoV-2 RNA is generally detectable in upper respiratory specimens during the acute phase of infection. The lowest concentration of SARS-CoV-2 viral copies this assay can detect is 138 copies/mL. A negative result does not  preclude SARS-Cov-2 infection and should not be used as the sole basis for treatment or other patient management decisions. A negative result may occur with  improper specimen collection/handling, submission of specimen other than nasopharyngeal swab, presence of viral mutation(s) within the areas targeted by this assay, and inadequate number of viral copies(<138 copies/mL). A negative result must be combined with clinical observations, patient history, and epidemiological information. The expected result is Negative.  Fact Sheet  for Patients:  EntrepreneurPulse.com.au  Fact Sheet for Healthcare Providers:  IncredibleEmployment.be  This test is no t yet approved or cleared by the [place] FDA and  has been authorized for detection and/or diagnosis of SARS-CoV-2 by FDA under an Emergency Use Authorization (EUA). This EUA will remain  in effect (meaning this test can be used) for the duration of the COVID-19 declaration under Section 564(b)(1) of the Act, 21 U.S.C.section 360bbb-3(b)(1), unless the authorization is terminated  or revoked sooner.       Influenza A by PCR NEGATIVE NEGATIVE Final   Influenza B by PCR NEGATIVE NEGATIVE Final    Comment: (NOTE) The Xpert Xpress SARS-CoV-2/FLU/RSV plus assay is intended as an aid in the diagnosis of influenza from Nasopharyngeal swab specimens and should not be used as a sole basis for treatment. Nasal washings and aspirates are unacceptable for Xpert Xpress SARS-CoV-2/FLU/RSV testing.  Fact Sheet for Patients: EntrepreneurPulse.com.au  Fact Sheet for Healthcare Providers: IncredibleEmployment.be  This test is not yet approved or cleared by the [place] FDA and has been authorized for detection and/or diagnosis of SARS-CoV-2 by FDA under an Emergency Use Authorization (EUA). This EUA will remain in effect (meaning this test can be used) for the duration of the COVID-19 declaration under Section 564(b)(1) of the Act, 21 U.S.C. section 360bbb-3(b)(1), unless the authorization is terminated or revoked.     Resp Syncytial Virus by PCR NEGATIVE NEGATIVE Final    Comment: (NOTE) Fact Sheet for Patients: EntrepreneurPulse.com.au  Fact Sheet for Healthcare Providers: IncredibleEmployment.be  This test is not yet approved or cleared by the [place] FDA and has been authorized for detection and/or diagnosis of SARS-CoV-2 by FDA under an  Emergency Use Authorization (EUA). This EUA will remain in effect (meaning this test can be used) for the duration of the COVID-19 declaration under Section 564(b)(1) of the Act, 21 U.S.C. section 360bbb-3(b)(1), unless the authorization is terminated or revoked.  Performed at [facility], [address] [place], [place] [address]   Blood culture (routine x 2)     Status: None (Preliminary result)   Collection Time: 09/11/22 12:45 PM   Specimen: BLOOD RIGHT WRIST  Result Value Ref Range Status   Specimen Description   Final    BLOOD RIGHT WRIST Performed at [facility] [address] [place], [place] [address]    Special Requests   Final    Blood Culture adequate volume BOTTLES DRAWN AEROBIC AND ANAEROBIC Performed at [facility], [address] [place], [place] [address]    Culture   Final    NO GROWTH < 24 HOURS Performed at [facility], [place] [address], [place], [place] [address]    Report Status PENDING  Incomplete  MRSA Next Gen by PCR, Nasal     Status: None   Collection Time: 09/11/22  6:42 PM   Specimen: Nasal Mucosa; Nasal Swab  Result Value Ref Range Status   MRSA by PCR Next Gen NOT DETECTED NOT DETECTED Final  Comment: (NOTE) The GeneXpert MRSA Assay (FDA approved for NASAL specimens only), is one component of a comprehensive MRSA colonization surveillance program. It is not intended to diagnose MRSA infection nor to guide or monitor treatment for MRSA infections. Test performance is not FDA approved in patients less than 16 years old. Performed at [facility], [place] [address] [place], [place] [address]          Radiology Studies: US ABDOMEN LIMITED RUQ (LIVER/GB)  Result Date: 09/12/2022 CLINICAL DATA:  Choledocholithiasis EXAM: ULTRASOUND ABDOMEN LIMITED RIGHT UPPER QUADRANT COMPARISON:  CT 09/11/2022 FINDINGS: Gallbladder: Nondilated gallbladder without wall thickening or pericholecystic  fluid. No significant sludge or intraluminal gallstones identified. Negative sonographic Murphy sign. Common bile duct: Diameter: 5 mm, normal. No intrahepatic ductal dilation. No intraductal stone identified in the proximal duct. Liver: No focal lesion identified. Within normal limits in parenchymal echogenicity. Portal vein is patent on color Doppler imaging with normal direction of blood flow towards the liver. Other: Limited exam due to bowel gas. IMPRESSION: No evidence biliary obstruction. Electronically Signed   By: Maurine Simmering M.D.   On: 09/12/2022 15:20   CT Angio Chest/Abd/Pel for Dissection W and/or Wo Contrast  Result Date: 09/11/2022 CLINICAL DATA:  Chest pain since Saturday, radiating to back, dyspnea on exertion, nausea, dizziness EXAM: CT ANGIOGRAPHY CHEST, ABDOMEN AND PELVIS TECHNIQUE: Non-contrast CT of the chest was initially obtained. Multidetector CT imaging through the chest, abdomen and pelvis was performed using the standard protocol during bolus administration of intravenous contrast. Multiplanar reconstructed images and MIPs were obtained and reviewed to evaluate the vascular anatomy. RADIATION DOSE REDUCTION: This exam was performed according to the departmental dose-optimization program which includes automated exposure control, adjustment of the mA and/or kV according to patient size and/or use of iterative reconstruction technique. CONTRAST:  140m OMNIPAQUE IOHEXOL 350 MG/ML SOLN COMPARISON:  01/28/2022, 09/11/2022 FINDINGS: CTA CHEST FINDINGS Cardiovascular: No evidence of thoracic aortic aneurysm or dissection. Atherosclerosis of the aortic arch is again noted. The heart is unremarkable without pericardial effusion. While not optimized for the opacification of the pulmonary vasculature, there is adequate opacification to exclude central and segmental pulmonary emboli. Mediastinum/Nodes: No enlarged mediastinal, hilar, or axillary lymph nodes. Thyroid gland, trachea, and esophagus  demonstrate no significant findings. Lungs/Pleura: Basilar predominant subpleural scarring and fibrosis unchanged. No acute airspace disease, effusion, or pneumothorax. The central airways are patent. Musculoskeletal: There are no acute displaced fractures. Reconstructed images demonstrate no additional findings. Review of the MIP images confirms the above findings. CTA ABDOMEN AND PELVIS FINDINGS VASCULAR Aorta: Normal caliber aorta without aneurysm, dissection, vasculitis or significant stenosis. Mild atherosclerosis. Celiac: Patent without evidence of aneurysm, dissection, vasculitis or significant stenosis. Mild ostial atherosclerosis without flow limiting stenosis. SMA: Patent without evidence of aneurysm, dissection, vasculitis or significant stenosis. Mild ostial atherosclerosis without flow limiting stenosis. Renals: There is moderate atherosclerosis at the origin of the bilateral renal arteries, right greater than left. Right renal artery stenosis estimated 50-70%, with left renal artery stenosis estimated 50%. No aneurysm, dissection, vasculitis, or evidence of fibromuscular dysplasia. IMA: Patent without evidence of aneurysm, dissection, vasculitis or significant stenosis. Inflow: Patent without evidence of aneurysm, dissection, vasculitis or significant stenosis. Veins: No obvious venous abnormality within the limitations of this arterial phase study. Review of the MIP images confirms the above findings. NON-VASCULAR Hepatobiliary: Respiratory motion limits evaluation. There are no focal liver abnormalities. Gallbladder is moderately distended without evidence of cholelithiasis or cholecystitis. Common bile duct measures up to 9 mm  in diameter, which may be appropriate for age. Within the downstream common bile duct near the ampulla, reference image 100/6, there is a 10 mm hyperdense structure. This could reflect mucosal enhancement or choledocholithiasis. If biliary pathology is clinically suspected,  MRCP could be performed. Pancreas: Diffuse fatty atrophy of the pancreas. Spleen: Normal in size without focal abnormality. Adrenals/Urinary Tract: Adrenal glands are unremarkable. Kidneys are normal, without renal calculi, focal lesion, or hydronephrosis. Bladder is unremarkable. Stomach/Bowel: No bowel obstruction or ileus. Diverticulosis of the distal colon without diverticulitis. No bowel wall thickening or inflammatory change. Lymphatic: No pathologic adenopathy within the abdomen or pelvis. Reproductive: Prostate is unremarkable. Other: No free fluid or free intraperitoneal gas. There is a fat containing right inguinal hernia. No bowel herniation. Musculoskeletal: No acute or destructive bony lesions. Chronic appearing L3 compression deformity. Reconstructed images demonstrate no additional findings. Review of the MIP images confirms the above findings. IMPRESSION: 1. No evidence of thoracoabdominal aortic aneurysm or dissection. 2. No evidence of pulmonary embolus. 3. High attenuation within the downstream common bile duct near the ampulla. This may reflect abnormal mucosal enhancement versus choledocholithiasis. Borderline dilation of the extrahepatic common bile duct. If further evaluation is desired, MRCP could be performed. 4. Distal colonic diverticulosis without diverticulitis. 5. Fat containing right inguinal hernia. 6. Aortic Atherosclerosis (ICD10-I70.0). Mild stenosis of the bilateral renal arteries at their origins, right greater than left. 7. Stable bibasilar parenchymal lung scarring and fibrosis. Electronically Signed   By: Randa Ngo M.D.   On: 09/11/2022 15:14   DG Chest 2 View  Result Date: 09/11/2022 CLINICAL DATA:  Chest pain EXAM: CHEST - 2 VIEW COMPARISON:  CT 01/28/2022.  X-ray 09/20/2021 and older FINDINGS: Stable cardiopericardial silhouette. No pneumothorax or effusion. Volume loss seen, underinflation with diffuse extensive interstitial changes noted bilaterally, similar to  previous. Likely a chronic process. Please correlate with prior CT scan for interstitial lung disease. Air-fluid level along the stomach beneath the left hemidiaphragm. Overlapping cardiac leads IMPRESSION: Low lung volumes with extensive interstitial changes, similar to previous. Please correlate for history of interstitial lung disease. No secondary consolidation at this time or effusion Electronically Signed   By: Jill Side M.D.   On: 09/11/2022 12:24        Scheduled Meds:  Chlorhexidine Gluconate Cloth  6 each Topical Daily   gabapentin  400 mg Oral BID   insulin aspart  0-9 Units Subcutaneous Q4H   Continuous Infusions:  sodium chloride 75 mL/hr at 09/12/22 1600   lactated ringers Stopped (09/11/22 2005)   piperacillin-tazobactam (ZOSYN)  IV Stopped (09/12/22 1550)     LOS: 1 day    Time spent: 40 minutes    Irine Seal, MD [facility]   To contact the attending provider between 7A-7P or the covering provider during after hours 7P-7A, please log into the web site [email or web address] and access using universal [place] password for that web site. If you do not have the password, please call the hospital operator.  09/12/2022, 5:02 PM

## 2022-09-13 ENCOUNTER — Inpatient Hospital Stay (HOSPITAL_COMMUNITY): Payer: Medicare HMO | Admitting: Certified Registered Nurse Anesthetist

## 2022-09-13 ENCOUNTER — Inpatient Hospital Stay (HOSPITAL_COMMUNITY): Payer: Medicare HMO

## 2022-09-13 ENCOUNTER — Encounter (HOSPITAL_COMMUNITY): Payer: Self-pay | Admitting: Internal Medicine

## 2022-09-13 ENCOUNTER — Encounter (HOSPITAL_COMMUNITY)
Admission: EM | Disposition: A | Discharge status: Home / Self Care | Payer: Self-pay | Source: Home / Self Care | Attending: Internal Medicine

## 2022-09-13 DIAGNOSIS — E785 Hyperlipidemia, unspecified: Secondary | ICD-10-CM

## 2022-09-13 DIAGNOSIS — I2510 Atherosclerotic heart disease of native coronary artery without angina pectoris: Secondary | ICD-10-CM

## 2022-09-13 DIAGNOSIS — A419 Sepsis, unspecified organism: Secondary | ICD-10-CM | POA: Diagnosis not present

## 2022-09-13 DIAGNOSIS — K851 Biliary acute pancreatitis without necrosis or infection: Secondary | ICD-10-CM | POA: Diagnosis not present

## 2022-09-13 DIAGNOSIS — I119 Hypertensive heart disease without heart failure: Secondary | ICD-10-CM | POA: Diagnosis not present

## 2022-09-13 DIAGNOSIS — N1831 Chronic kidney disease, stage 3a: Secondary | ICD-10-CM | POA: Diagnosis not present

## 2022-09-13 DIAGNOSIS — K8070 Calculus of gallbladder and bile duct without cholecystitis without obstruction: Secondary | ICD-10-CM | POA: Diagnosis not present

## 2022-09-13 DIAGNOSIS — K8050 Calculus of bile duct without cholangitis or cholecystitis without obstruction: Secondary | ICD-10-CM | POA: Diagnosis not present

## 2022-09-13 HISTORY — PX: SPHINCTEROTOMY: SHX5544

## 2022-09-13 HISTORY — PX: ERCP: SHX5425

## 2022-09-13 HISTORY — PX: REMOVAL OF STONES: SHX5545

## 2022-09-13 LAB — CBC WITH DIFFERENTIAL/PLATELET
Abs Immature Granulocytes: 0.04 10*3/uL (ref 0.00–0.07)
Basophils Absolute: 0.1 10*3/uL (ref 0.0–0.1)
Basophils Relative: 1 %
Eosinophils Absolute: 1.1 10*3/uL — ABNORMAL HIGH (ref 0.0–0.5)
Eosinophils Relative: 10 %
HCT: 37.8 % — ABNORMAL LOW (ref 39.0–52.0)
Hemoglobin: 12.3 g/dL — ABNORMAL LOW (ref 13.0–17.0)
Immature Granulocytes: 0 %
Lymphocytes Relative: 17 %
Lymphs Abs: 1.8 10*3/uL (ref 0.7–4.0)
MCH: 28.9 pg (ref 26.0–34.0)
MCHC: 32.5 g/dL (ref 30.0–36.0)
MCV: 88.7 fL (ref 80.0–100.0)
Monocytes Absolute: 1.1 10*3/uL — ABNORMAL HIGH (ref 0.1–1.0)
Monocytes Relative: 10 %
Neutro Abs: 6.8 10*3/uL (ref 1.7–7.7)
Neutrophils Relative %: 62 %
Platelets: 199 10*3/uL (ref 150–400)
RBC: 4.26 MIL/uL (ref 4.22–5.81)
RDW: 13.9 % (ref 11.5–15.5)
WBC: 11 10*3/uL — ABNORMAL HIGH (ref 4.0–10.5)
nRBC: 0 % (ref 0.0–0.2)

## 2022-09-13 LAB — COMPREHENSIVE METABOLIC PANEL
ALT: 103 U/L — ABNORMAL HIGH (ref 0–44)
AST: 77 U/L — ABNORMAL HIGH (ref 15–41)
Albumin: 3 g/dL — ABNORMAL LOW (ref 3.5–5.0)
Alkaline Phosphatase: 361 U/L — ABNORMAL HIGH (ref 38–126)
Anion gap: 5 (ref 5–15)
BUN: 16 mg/dL (ref 8–23)
CO2: 26 mmol/L (ref 22–32)
Calcium: 8.1 mg/dL — ABNORMAL LOW (ref 8.9–10.3)
Chloride: 103 mmol/L (ref 98–111)
Creatinine, Ser: 1.16 mg/dL (ref 0.61–1.24)
GFR, Estimated: >60 mL/min (ref >60)
Glucose, Bld: 83 mg/dL (ref 70–99)
Potassium: 3.9 mmol/L (ref 3.5–5.1)
Sodium: 134 mmol/L — ABNORMAL LOW (ref 135–145)
Total Bilirubin: 4.7 mg/dL — ABNORMAL HIGH (ref 0.3–1.2)
Total Protein: 5.9 g/dL — ABNORMAL LOW (ref 6.5–8.1)

## 2022-09-13 LAB — GLUCOSE, CAPILLARY
Glucose-Capillary: 105 mg/dL — ABNORMAL HIGH (ref 70–99)
Glucose-Capillary: 126 mg/dL — ABNORMAL HIGH (ref 70–99)
Glucose-Capillary: 128 mg/dL — ABNORMAL HIGH (ref 70–99)
Glucose-Capillary: 154 mg/dL — ABNORMAL HIGH (ref 70–99)
Glucose-Capillary: 211 mg/dL — ABNORMAL HIGH (ref 70–99)
Glucose-Capillary: 83 mg/dL (ref 70–99)
Glucose-Capillary: 83 mg/dL (ref 70–99)
Glucose-Capillary: 84 mg/dL (ref 70–99)

## 2022-09-13 LAB — MAGNESIUM: Magnesium: 2 mg/dL (ref 1.7–2.4)

## 2022-09-13 LAB — LIPASE, BLOOD: Lipase: 26 U/L (ref 11–51)

## 2022-09-13 SURGERY — ERCP, WITH INTERVENTION IF INDICATED
Anesthesia: General

## 2022-09-13 MED ORDER — HYDRALAZINE HCL 20 MG/ML IJ SOLN: 5.0000 mg | Freq: Four times a day (QID) | INTRAMUSCULAR | Status: DC | PRN

## 2022-09-13 MED ORDER — DICLOFENAC SUPPOSITORY 100 MG
RECTAL | Status: AC
  Filled 2022-09-13: qty 1

## 2022-09-13 MED ORDER — LIDOCAINE 2% (20 MG/ML) 5 ML SYRINGE
INTRAMUSCULAR | Status: DC | PRN
  Administered 2022-09-13: 60 mg via INTRAVENOUS

## 2022-09-13 MED ORDER — ACETAMINOPHEN 10 MG/ML IV SOLN
INTRAVENOUS | Status: DC | PRN
  Administered 2022-09-13: 1000 mg via INTRAVENOUS

## 2022-09-13 MED ORDER — ACETAMINOPHEN 10 MG/ML IV SOLN
INTRAVENOUS | Status: AC
  Filled 2022-09-13: qty 100

## 2022-09-13 MED ORDER — ONDANSETRON HCL 4 MG/2ML IJ SOLN
INTRAMUSCULAR | Status: DC | PRN
  Administered 2022-09-13: 4 mg via INTRAVENOUS

## 2022-09-13 MED ORDER — ROCURONIUM BROMIDE 10 MG/ML (PF) SYRINGE
PREFILLED_SYRINGE | INTRAVENOUS | Status: DC | PRN
  Administered 2022-09-13: 50 mg via INTRAVENOUS

## 2022-09-13 MED ORDER — PROPOFOL 10 MG/ML IV BOLUS
INTRAVENOUS | Status: AC
  Filled 2022-09-13: qty 20

## 2022-09-13 MED ORDER — FENTANYL CITRATE (PF) 100 MCG/2ML IJ SOLN
INTRAMUSCULAR | Status: AC
  Filled 2022-09-13: qty 2

## 2022-09-13 MED ORDER — SODIUM CHLORIDE 0.9 % IV SOLN
INTRAVENOUS | Status: DC | PRN
  Administered 2022-09-13: 30 mL

## 2022-09-13 MED ORDER — FENTANYL CITRATE (PF) 100 MCG/2ML IJ SOLN
INTRAMUSCULAR | Status: DC | PRN
  Administered 2022-09-13: 25 ug via INTRAVENOUS
  Administered 2022-09-13: 75 ug via INTRAVENOUS

## 2022-09-13 MED ORDER — PHENYLEPHRINE 80 MCG/ML (10ML) SYRINGE FOR IV PUSH (FOR BLOOD PRESSURE SUPPORT)
PREFILLED_SYRINGE | INTRAVENOUS | Status: DC | PRN
  Administered 2022-09-13 (×2): 80 ug via INTRAVENOUS

## 2022-09-13 MED ORDER — PROPOFOL 10 MG/ML IV BOLUS
INTRAVENOUS | Status: DC | PRN
  Administered 2022-09-13: 100 mg via INTRAVENOUS

## 2022-09-13 MED ORDER — DEXAMETHASONE SODIUM PHOSPHATE 4 MG/ML IJ SOLN
INTRAMUSCULAR | Status: DC | PRN
  Administered 2022-09-13: 5 mg via INTRAVENOUS

## 2022-09-13 MED ORDER — SUCCINYLCHOLINE CHLORIDE 200 MG/10ML IV SOSY
PREFILLED_SYRINGE | INTRAVENOUS | Status: DC | PRN
  Administered 2022-09-13: 100 mg via INTRAVENOUS

## 2022-09-13 MED ORDER — DICLOFENAC SUPPOSITORY 100 MG
RECTAL | Status: DC | PRN
  Administered 2022-09-13: 100 mg via RECTAL

## 2022-09-13 MED ORDER — SUGAMMADEX SODIUM 200 MG/2ML IV SOLN
INTRAVENOUS | Status: DC | PRN
  Administered 2022-09-13: 200 mg via INTRAVENOUS

## 2022-09-13 MED ORDER — GLUCAGON HCL RDNA (DIAGNOSTIC) 1 MG IJ SOLR
INTRAMUSCULAR | Status: AC
  Filled 2022-09-13: qty 1

## 2022-09-13 NOTE — Anesthesia Procedure Notes (Signed)
Procedure Name: Intubation Date/Time: 09/13/2022 12:00 PM  Performed by: Claudia Desanctis, CRNAPre-anesthesia Checklist: Patient identified, Emergency Drugs available, Suction available and Patient being monitored Patient Re-evaluated:Patient Re-evaluated prior to induction Oxygen Delivery Method: Circle system utilized Preoxygenation: Pre-oxygenation with 100% oxygen Induction Type: IV induction, Rapid sequence and Cricoid Pressure applied Laryngoscope Size: Miller and 3 Grade View: Grade I Tube type: Oral Tube size: 8.0 mm Number of attempts: 1 Airway Equipment and Method: Stylet Placement Confirmation: ETT inserted through vocal cords under direct vision, positive ETCO2 and breath sounds checked- equal and bilateral Secured at: 22 cm Tube secured with: Tape Dental Injury: Teeth and Oropharynx as per pre-operative assessment

## 2022-09-13 NOTE — Progress Notes (Signed)
PROGRESS NOTE    Kerry Matthews  TFT:[id number] DOB: [birth year] DOA: 09/11/2022 PCP: [name], MD    Chief Complaint  Patient presents with   Chest Pain    Brief Narrative:  Patient is a pleasant 87 year old gentleman, history of pulmonary fibrosis/interstitial lung disease, alpha-1 antitrypsin deficiency, emphysema, connective tissue disease, diabetes, hypertension, CAD, CKD, microscopic polyangiitis presenting with sudden onset epigastric chest pain radiating to his back started on the morning of admission after eating his breakfast.  Patient seen in the ED CT chest abdomen and pelvis done concerning for choledocholithiasis.  Labs obtained in the ED with a transaminitis, elevated lipase levels in the 600s.  Patient placed empirically on IV fluids, IV antibiotics in the ED, noted to have elevated leukocytosis and noted to have been developing a tachycardia tachypnea concern for early developing sepsis and admitted to the hospital for further evaluation.  ED physician spoke with GI who had recommended admission and need for possible ERCP.  Patient admitted to stepdown unit.   Assessment & Plan:   Principal Problem:   Sepsis ([place]) Active Problems:   Choledocholithiasis   Acute gallstone pancreatitis   Chronic kidney disease, stage 3a (HCC)   Diabetic polyneuropathy associated with type 2 diabetes mellitus ([place])   Mixed hyperlipidemia   Coronary artery disease involving native coronary artery of native heart without angina pectoris   Interstitial pulmonary disease (HCC)   Type 2 diabetes mellitus with hyperglycemia, with long-term current use of insulin ([place])  #1 probable early evolving sepsis -Patient met criteria for sepsis on admission due to tachycardia, tachypnea, leukocytosis, patient had presented with concerns for choledocholithiasis with sudden onset epigastric abdominal pain radiating to the back, significantly elevated lipase levels concerning for gallstone pancreatitis  with transaminitis. -Patient pancultured, blood cultures pending, urinalysis done unremarkable. -Continue IV Zosyn. -GI ff, patient for ERCP today.  2.  Choledocholithiasis/acute gallstone pancreatitis -Patient had presented with sudden onset epigastric abdominal pain with radiation to the back.  Patient noted to have a transaminitis, lipase levels elevated at 653 on admission with significant decrease to 26 today.??  Passed gallstones. -LFTs trending down, patient with clinical improvement. -CT gram chest/abdomen and pelvis with concerns for choledocholithiasis, distal colonic diverticulosis without diverticulitis, fat-containing right inguinal hernia, no evidence of pulmonary embolus, no evidence of thoracoabdominal aortic aneurysm or dissection. -Patient jaundiced on examination. -Patient with a transaminitis on presentation which seems to be slowly trending down. -GI consulted right upper quadrant ultrasound obtained with no evidence of biliary obstruction. -GI recommending ERCP hopefully to be done today.   -Continue empiric IV Zosyn. -May need laparoscopic cholecystectomy during this hospitalization after workup is completed however will defer to GI for timing of surgical input.  3.  CKD stage IIIa -Renal function stable.  4.  Hyperlipidemia -Continue to hold statin as patient with a transaminitis.  5.  Interstitial pulmonary disease -Patient with diffuse fine crackles. -Chest x-ray no significant change from prior chest x-ray. -Patient on IV fluids, monitor closely for volume overload. -Setting of IV fluids after bag is completed today. -Continue incentive spirometry, flutter valve.   -Outpatient follow-up with primary pulmonologist and rheumatologist.  6.  Type 2 diabetes with hyperglycemia -Hemoglobin A1c 6.1 (07/08/2022) -CBGs of 83. -Continue to hold home regimen oral hypoglycemic agents. -SSI. -Continue home regimen Neurontin.   7.  CAD -Stable. -Aspirin and statin  on hold.    DVT prophylaxis: SCDs Code Status: Full Family Communication: Updated patient, wife, daughter at bedside. Disposition: Likely home when clinically improved  and cleared by GI.  Status is: Inpatient Remains inpatient appropriate because: Severity of illness   Consultants:  Gastroenterology: Dr. Carlean Purl 09/12/2020  Procedures:  CT angiogram chest abdomen and pelvis 09/11/2022 Chest x-ray 09/11/2022 Right upper quadrant ultrasound 09/12/2022 ERCP pending  Antimicrobials:  IV Zosyn 09/11/2022>>>>>>   Subjective: Sitting up in bed, wife and daughter at bedside.  Noted to have chest headache.  Session with his pastor on the phone.  Denies any chest pain.  No significant shortness of breath.  States abdominal pain is improved.  Denies any nausea or vomiting.  S/p to go for ERCP to be done this morning.   Objective: Vitals:   09/13/22 0722 09/13/22 0800 09/13/22 0847 09/13/22 0900  BP:  (!) 125/42  (!) 120/57  Pulse: 60 63  (!) 59  Resp: (!) 21 (!) 21  (!) 21  Temp:   98.8 F (37.1 C)   TempSrc:   Oral   SpO2: 94% 94%  95%  Weight:      Height:        Intake/Output Summary (Last 24 hours) at 09/13/2022 1011 Last data filed at 09/13/2022 0817 Gross per 24 hour  Intake 1645.49 ml  Output 750 ml  Net 895.49 ml    Filed Weights   09/11/22 1135 09/11/22 1800  Weight: 78.9 kg 77 kg    Examination:  General exam: Slightly jaundiced.  NAD. Respiratory system: Diffuse fine crackles.  No wheezing, no rhonchi.  Fair air movement.  Speaking in full sentences.   Cardiovascular system: Regular rate rhythm no murmurs rubs or gallops.  No JVD.  No lower extremity edema. Gastrointestinal system: Abdomen is soft, nontender, nondistended, positive bowel sounds.  No rebound.  No guarding.  Central nervous system: Alert and oriented. No focal neurological deficits. Extremities: Symmetric 5 x 5 power. Skin: Jaundiced.  Psychiatry: Judgement and insight appear normal. Mood &  affect appropriate.     Data Reviewed: I have personally reviewed following labs and imaging studies  CBC: Recent Labs  Lab 09/11/22 1151 09/11/22 1400 09/12/22 0242 09/13/22 0253  WBC 16.1*  --  10.9* 11.0*  NEUTROABS  --  10.9*  --  6.8  HGB 15.1  --  12.8* 12.3*  HCT 45.2  --  39.0 37.8*  MCV 86.9  --  86.9 88.7  PLT 249  --  196 199     Basic Metabolic Panel: Recent Labs  Lab 09/11/22 1151 09/11/22 2018 09/12/22 0242 09/13/22 0253  NA 135 132* 134* 134*  K 4.1 4.3 4.1 3.9  CL 101 100 106 103  CO2 23 22 21* 26  GLUCOSE 143* 129* 103* 83  BUN '18 20 15 16  '$ CREATININE 1.29* 1.15 0.98 1.16  CALCIUM 8.9 8.2* 7.8* 8.1*  MG  --  2.1 1.9 2.0  PHOS  --  3.8 3.6  --      GFR: Estimated Creatinine Clearance: 46.3 mL/min (by C-G formula based on SCr of 1.16 mg/dL).  Liver Function Tests: Recent Labs  Lab 09/11/22 1151 09/11/22 2018 09/12/22 0242 09/13/22 0253  AST 274* 163* 136* 77*  ALT 194* 154* 137* 103*  ALKPHOS 437* 381* 357* 361*  BILITOT 3.7* 4.5* 5.3* 4.7*  PROT 7.4 5.9* 5.5* 5.9*  ALBUMIN 3.9 3.1* 2.9* 3.0*     CBG: Recent Labs  Lab 09/12/22 1634 09/12/22 2007 09/12/22 2319 09/13/22 0313 09/13/22 0752  GLUCAP 77 182* 126* 84 83      Recent Results (from the past 240 hour(s))  Resp panel by RT-PCR (RSV, Flu A&B, Covid) Nasopharyngeal Swab     Status: None   Collection Time: 09/11/22 11:51 AM   Specimen: Nasopharyngeal Swab; Nasal Swab  Result Value Ref Range Status   SARS Coronavirus 2 by RT PCR NEGATIVE NEGATIVE Final    Comment: (NOTE) SARS-CoV-2 target nucleic acids are NOT DETECTED.  The SARS-CoV-2 RNA is generally detectable in upper respiratory specimens during the acute phase of infection. The lowest concentration of SARS-CoV-2 viral copies this assay can detect is 138 copies/mL. A negative result does not preclude SARS-Cov-2 infection and should not be used as the sole basis for treatment or other patient management  decisions. A negative result may occur with  improper specimen collection/handling, submission of specimen other than nasopharyngeal swab, presence of viral mutation(s) within the areas targeted by this assay, and inadequate number of viral copies(<138 copies/mL). A negative result must be combined with clinical observations, patient history, and epidemiological information. The expected result is Negative.  Fact Sheet for Patients:  EntrepreneurPulse.com.au  Fact Sheet for Healthcare Providers:  IncredibleEmployment.be  This test is no t yet approved or cleared by the [place] FDA and  has been authorized for detection and/or diagnosis of SARS-CoV-2 by FDA under an Emergency Use Authorization (EUA). This EUA will remain  in effect (meaning this test can be used) for the duration of the COVID-19 declaration under Section 564(b)(1) of the Act, 21 U.S.C.section 360bbb-3(b)(1), unless the authorization is terminated  or revoked sooner.       Influenza A by PCR NEGATIVE NEGATIVE Final   Influenza B by PCR NEGATIVE NEGATIVE Final    Comment: (NOTE) The Xpert Xpress SARS-CoV-2/FLU/RSV plus assay is intended as an aid in the diagnosis of influenza from Nasopharyngeal swab specimens and should not be used as a sole basis for treatment. Nasal washings and aspirates are unacceptable for Xpert Xpress SARS-CoV-2/FLU/RSV testing.  Fact Sheet for Patients: EntrepreneurPulse.com.au  Fact Sheet for Healthcare Providers: IncredibleEmployment.be  This test is not yet approved or cleared by the [place] FDA and has been authorized for detection and/or diagnosis of SARS-CoV-2 by FDA under an Emergency Use Authorization (EUA). This EUA will remain in effect (meaning this test can be used) for the duration of the COVID-19 declaration under Section 564(b)(1) of the Act, 21 U.S.C. section 360bbb-3(b)(1), unless the  authorization is terminated or revoked.     Resp Syncytial Virus by PCR NEGATIVE NEGATIVE Final    Comment: (NOTE) Fact Sheet for Patients: EntrepreneurPulse.com.au  Fact Sheet for Healthcare Providers: IncredibleEmployment.be  This test is not yet approved or cleared by the [place] FDA and has been authorized for detection and/or diagnosis of SARS-CoV-2 by FDA under an Emergency Use Authorization (EUA). This EUA will remain in effect (meaning this test can be used) for the duration of the COVID-19 declaration under Section 564(b)(1) of the Act, 21 U.S.C. section 360bbb-3(b)(1), unless the authorization is terminated or revoked.  Performed at [facility], [address] [place], [place] [address]   Blood culture (routine x 2)     Status: None (Preliminary result)   Collection Time: 09/11/22 12:45 PM   Specimen: BLOOD RIGHT WRIST  Result Value Ref Range Status   Specimen Description   Final    BLOOD RIGHT WRIST Performed at [facility] [address] [place], [place] [address]    Special Requests   Final    Blood Culture adequate volume BOTTLES DRAWN AEROBIC AND ANAEROBIC Performed  at [facility], [address] [place], [place] [address]    Culture   Final    NO GROWTH < 24 HOURS Performed at [facility], [place] [address] [place], [place] [address]    Report Status PENDING  Incomplete  MRSA Next Gen by PCR, Nasal     Status: None   Collection Time: 09/11/22  6:42 PM   Specimen: Nasal Mucosa; Nasal Swab  Result Value Ref Range Status   MRSA by PCR Next Gen NOT DETECTED NOT DETECTED Final    Comment: (NOTE) The GeneXpert MRSA Assay (FDA approved for NASAL specimens only), is one component of a comprehensive MRSA colonization surveillance program. It is not intended to diagnose MRSA infection nor to guide or monitor treatment for MRSA infections. Test performance is not FDA approved in  patients less than 78 years old. Performed at [facility] [place] [facility], [place] [address], [place], [place] [address]          Radiology Studies: US ABDOMEN LIMITED RUQ (LIVER/GB)  Result Date: 09/12/2022 CLINICAL DATA:  Choledocholithiasis EXAM: ULTRASOUND ABDOMEN LIMITED RIGHT UPPER QUADRANT COMPARISON:  CT 09/11/2022 FINDINGS: Gallbladder: Nondilated gallbladder without wall thickening or pericholecystic fluid. No significant sludge or intraluminal gallstones identified. Negative sonographic Murphy sign. Common bile duct: Diameter: 5 mm, normal. No intrahepatic ductal dilation. No intraductal stone identified in the proximal duct. Liver: No focal lesion identified. Within normal limits in parenchymal echogenicity. Portal vein is patent on color Doppler imaging with normal direction of blood flow towards the liver. Other: Limited exam due to bowel gas. IMPRESSION: No evidence biliary obstruction. Electronically Signed   By: Maurine Simmering M.D.   On: 09/12/2022 15:20   CT Angio Chest/Abd/Pel for Dissection W and/or Wo Contrast  Result Date: 09/11/2022 CLINICAL DATA:  Chest pain since Saturday, radiating to back, dyspnea on exertion, nausea, dizziness EXAM: CT ANGIOGRAPHY CHEST, ABDOMEN AND PELVIS TECHNIQUE: Non-contrast CT of the chest was initially obtained. Multidetector CT imaging through the chest, abdomen and pelvis was performed using the standard protocol during bolus administration of intravenous contrast. Multiplanar reconstructed images and MIPs were obtained and reviewed to evaluate the vascular anatomy. RADIATION DOSE REDUCTION: This exam was performed according to the departmental dose-optimization program which includes automated exposure control, adjustment of the mA and/or kV according to patient size and/or use of iterative reconstruction technique. CONTRAST:  132m OMNIPAQUE IOHEXOL 350 MG/ML SOLN COMPARISON:  01/28/2022, 09/11/2022 FINDINGS: CTA CHEST FINDINGS Cardiovascular: No  evidence of thoracic aortic aneurysm or dissection. Atherosclerosis of the aortic arch is again noted. The heart is unremarkable without pericardial effusion. While not optimized for the opacification of the pulmonary vasculature, there is adequate opacification to exclude central and segmental pulmonary emboli. Mediastinum/Nodes: No enlarged mediastinal, hilar, or axillary lymph nodes. Thyroid gland, trachea, and esophagus demonstrate no significant findings. Lungs/Pleura: Basilar predominant subpleural scarring and fibrosis unchanged. No acute airspace disease, effusion, or pneumothorax. The central airways are patent. Musculoskeletal: There are no acute displaced fractures. Reconstructed images demonstrate no additional findings. Review of the MIP images confirms the above findings. CTA ABDOMEN AND PELVIS FINDINGS VASCULAR Aorta: Normal caliber aorta without aneurysm, dissection, vasculitis or significant stenosis. Mild atherosclerosis. Celiac: Patent without evidence of aneurysm, dissection, vasculitis or significant stenosis. Mild ostial atherosclerosis without flow limiting stenosis. SMA: Patent without evidence of aneurysm, dissection, vasculitis or significant stenosis. Mild ostial atherosclerosis without flow limiting stenosis. Renals: There is moderate atherosclerosis at the origin of the bilateral renal arteries, right greater  than left. Right renal artery stenosis estimated 50-70%, with left renal artery stenosis estimated 50%. No aneurysm, dissection, vasculitis, or evidence of fibromuscular dysplasia. IMA: Patent without evidence of aneurysm, dissection, vasculitis or significant stenosis. Inflow: Patent without evidence of aneurysm, dissection, vasculitis or significant stenosis. Veins: No obvious venous abnormality within the limitations of this arterial phase study. Review of the MIP images confirms the above findings. NON-VASCULAR Hepatobiliary: Respiratory motion limits evaluation. There are no  focal liver abnormalities. Gallbladder is moderately distended without evidence of cholelithiasis or cholecystitis. Common bile duct measures up to 9 mm in diameter, which may be appropriate for age. Within the downstream common bile duct near the ampulla, reference image 100/6, there is a 10 mm hyperdense structure. This could reflect mucosal enhancement or choledocholithiasis. If biliary pathology is clinically suspected, MRCP could be performed. Pancreas: Diffuse fatty atrophy of the pancreas. Spleen: Normal in size without focal abnormality. Adrenals/Urinary Tract: Adrenal glands are unremarkable. Kidneys are normal, without renal calculi, focal lesion, or hydronephrosis. Bladder is unremarkable. Stomach/Bowel: No bowel obstruction or ileus. Diverticulosis of the distal colon without diverticulitis. No bowel wall thickening or inflammatory change. Lymphatic: No pathologic adenopathy within the abdomen or pelvis. Reproductive: Prostate is unremarkable. Other: No free fluid or free intraperitoneal gas. There is a fat containing right inguinal hernia. No bowel herniation. Musculoskeletal: No acute or destructive bony lesions. Chronic appearing L3 compression deformity. Reconstructed images demonstrate no additional findings. Review of the MIP images confirms the above findings. IMPRESSION: 1. No evidence of thoracoabdominal aortic aneurysm or dissection. 2. No evidence of pulmonary embolus. 3. High attenuation within the downstream common bile duct near the ampulla. This may reflect abnormal mucosal enhancement versus choledocholithiasis. Borderline dilation of the extrahepatic common bile duct. If further evaluation is desired, MRCP could be performed. 4. Distal colonic diverticulosis without diverticulitis. 5. Fat containing right inguinal hernia. 6. Aortic Atherosclerosis (ICD10-I70.0). Mild stenosis of the bilateral renal arteries at their origins, right greater than left. 7. Stable bibasilar parenchymal lung  scarring and fibrosis. Electronically Signed   By: Randa Ngo M.D.   On: 09/11/2022 15:14   DG Chest 2 View  Result Date: 09/11/2022 CLINICAL DATA:  Chest pain EXAM: CHEST - 2 VIEW COMPARISON:  CT 01/28/2022.  X-ray 09/20/2021 and older FINDINGS: Stable cardiopericardial silhouette. No pneumothorax or effusion. Volume loss seen, underinflation with diffuse extensive interstitial changes noted bilaterally, similar to previous. Likely a chronic process. Please correlate with prior CT scan for interstitial lung disease. Air-fluid level along the stomach beneath the left hemidiaphragm. Overlapping cardiac leads IMPRESSION: Low lung volumes with extensive interstitial changes, similar to previous. Please correlate for history of interstitial lung disease. No secondary consolidation at this time or effusion Electronically Signed   By: Jill Side M.D.   On: 09/11/2022 12:24        Scheduled Meds:  Chlorhexidine Gluconate Cloth  6 each Topical Daily   gabapentin  400 mg Oral BID   insulin aspart  0-9 Units Subcutaneous Q4H   Continuous Infusions:  acetaminophen     lactated ringers 75 mL/hr at 09/13/22 0815   piperacillin-tazobactam (ZOSYN)  IV Stopped (09/13/22 0749)     LOS: 2 days    Time spent: 40 minutes    Irine Seal, MD [facility]   To contact the attending provider between 7A-7P or the covering provider during after hours 7P-7A, please log into the web site [email or web address] and access using universal [place] password for that web site. If you do  not have the password, please call the hospital operator.  09/13/2022, 10:11 AM

## 2022-09-13 NOTE — Anesthesia Postprocedure Evaluation (Signed)
Anesthesia Post Note  Patient: Kerry Matthews  Procedure(s) Performed: ENDOSCOPIC RETROGRADE CHOLANGIOPANCREATOGRAPHY (ERCP) SPHINCTEROTOMY REMOVAL OF STONES     Patient location during evaluation: PACU Anesthesia Type: General Level of consciousness: awake and alert and oriented Pain management: pain level controlled Vital Signs Assessment: post-procedure vital signs reviewed and stable Respiratory status: spontaneous breathing, nonlabored ventilation, respiratory function stable and patient connected to nasal cannula oxygen Cardiovascular status: blood pressure returned to baseline and stable Postop Assessment: no apparent nausea or vomiting Anesthetic complications: no   No notable events documented.  Last Vitals:  Vitals:   09/13/22 1245 09/13/22 1300  BP: 123/66 127/60  Pulse: 69 71  Resp: 18 18  Temp:    SpO2: 99% 98%    Last Pain:  Vitals:   09/13/22 1300  TempSrc:   PainSc: 0-No pain                 Aylee Littrell A.

## 2022-09-13 NOTE — Progress Notes (Signed)
Patient ID: Kerry Matthews, male   DOB: 01/21/1936, 87 y.o.   MRN: [id number] Patient is grandfather of one of our OR nurses, stopped by to say hello.  Getting ERCP with Dr Carlean Purl today.  No stones on [place] seen.  ? Stone or mucosal enhancement on ct scan with elevated TB.  Let me know if we need to be involved as lap chole certainly would be discussion if has stones as source

## 2022-09-13 NOTE — H&P (View-Only) (Signed)
   Patient Name: Kerry Matthews Date of Encounter: 09/13/2022, 7:49 AM    Subjective  No more abd pain but says some tenderness   Objective  BP (!) 118/53   Pulse 60   Temp 98.4 F (36.9 C) (Axillary)   Resp (!) 21   Ht '5\' 6"'$  (1.676 m)   Wt 77 kg   SpO2 94%   BMI 27.40 kg/m  Elderly wm NAD Icteric Lungs cta - ant Cor NL S1S2 no rmg Abd soft w/ sl RUQ tenderness   Recent Labs  Lab 09/11/22 1150 09/11/22 1151 09/11/22 2018 09/12/22 0242 09/13/22 0253  AST  --  274* 163* 136* 77*  ALT  --  194* 154* 137* 103*  ALKPHOS  --  437* 381* 357* 361*  BILITOT  --  3.7* 4.5* 5.3* 4.7*  PROT  --  7.4 5.9* 5.5* 5.9*  ALBUMIN  --  3.9 3.1* 2.9* 3.0*  INR 1.1  --   --   --   --     Lab Results  Component Value Date   LIPASE 26 09/13/2022    Recent Labs  Lab 09/11/22 1151 09/12/22 0242 09/13/22 0253  HGB 15.1 12.8* 12.3*  HCT 45.2 39.0 37.8*  WBC 16.1* 10.9* 11.0*  PLT 249 196 199    Recent Labs  Lab 09/11/22 1151 09/11/22 2018 09/12/22 0242 09/13/22 0253  NA 135 132* 134* 134*  K 4.1 4.3 4.1 3.9  CL 101 100 106 103  CO2 23 22 21* 26  GLUCOSE 143* 129* 103* 83  BUN '18 20 15 16  '$ CREATININE 1.29* 1.15 0.98 1.16  CALCIUM 8.9 8.2* 7.8* 8.1*  MG  --  2.1 1.9 2.0  PHOS  --  3.8 3.6  --     US ABDOMEN LIMITED RUQ (LIVER/GB) CLINICAL DATA:  Choledocholithiasis  EXAM: ULTRASOUND ABDOMEN LIMITED RIGHT UPPER QUADRANT  COMPARISON:  CT 09/11/2022  FINDINGS: Gallbladder:  Nondilated gallbladder without wall thickening or pericholecystic fluid. No significant sludge or intraluminal gallstones identified. Negative sonographic Murphy sign.  Common bile duct:  Diameter: 5 mm, normal. No intrahepatic ductal dilation. No intraductal stone identified in the proximal duct.  Liver:  No focal lesion identified. Within normal limits in parenchymal echogenicity. Portal vein is patent on color Doppler imaging with normal direction of blood flow towards the  liver.  Other: Limited exam due to bowel gas.  IMPRESSION: No evidence biliary obstruction.  Electronically Signed   By: Maurine Simmering M.D.   On: 09/12/2022 15:20      Assessment and Plan  Choledocholithiasis - may have passed - given level of bilirubin and clinical scenario will proceed w/ ERCP today  Gatha Mayer, MD, [facility] See Shea Evans on call - gastroenterology for best contact person 09/13/2022 7:49 AM

## 2022-09-13 NOTE — Progress Notes (Signed)
Patient en route to endo, vital signs stable. Transported by Endo RN.

## 2022-09-13 NOTE — Anesthesia Preprocedure Evaluation (Addendum)
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 on medications + CAD  Normal cardiovascular exam Rhythm:Regular Rate:Normal  Microscopic polyangiitis  EKG 09/11/22 NSR, PAC's, LAFB  Echo 08/18/21 1. Left ventricular ejection fraction, by estimation, is 65 to 70%. The  left ventricle has normal function. The left ventricle has no regional  wall motion abnormalities. There is severe concentric left ventricular  hypertrophy. Left ventricular diastolic parameters are indeterminate.   2. Right ventricular systolic function is normal. The right ventricular  size is normal. Tricuspid regurgitation signal is inadequate for assessing  PA pressure.   3. The mitral valve is grossly normal. Mild mitral valve regurgitation.   4. The aortic valve is tricuspid. There is mild calcification of the  aortic valve. Aortic valve regurgitation is mild. Aortic valve  sclerosis/calcification is present, without any evidence of aortic  stenosis. Aortic valve mean gradient measures 3.0  mmHg.   5. The inferior vena cava is normal in size with greater than 50%  respiratory variability, suggesting right atrial pressure of 3 mmHg.      Neuro/Psych  PSYCHIATRIC DISORDERS Anxiety     Diabetic peripheral neuropathy  Neuromuscular disease    GI/Hepatic PUD,GERD  Medicated,,Elevated LFT's Acute gallstone pancreatitis   Endo/Other  diabetes, Well Controlled, Type 2, Oral Hypoglycemic Agents   Hyperlipidemia Gout  Renal/GU Renal InsufficiencyRenal diseaseHx/o renal calculi  negative genitourinary   Musculoskeletal  (+) Arthritis , Osteoarthritis and Rheumatoid disorders,  Polymyositis   Abdominal   Peds  Hematology  (+) Blood dyscrasia, anemia + rheumatoid factor + ANCA   Anesthesia Other Findings   Reproductive/Obstetrics                             Anesthesia Physical Anesthesia Plan  ASA: 3 and emergent  Anesthesia Plan: General   Post-op Pain Management: Dilaudid IV and Precedex   Induction: Intravenous, Rapid sequence and Cricoid pressure planned  PONV Risk Score and Plan: 4 or greater and Treatment may vary due to age or medical condition, Ondansetron and Dexamethasone  Airway Management Planned: Oral ETT  Additional Equipment: None  Intra-op Plan:   Post-operative Plan: Extubation in OR  Informed Consent: I have reviewed the patients History and Physical, chart, labs and discussed the procedure including the risks, benefits and alternatives for the proposed anesthesia with the patient or authorized representative who has indicated his/her understanding and acceptance.     Dental advisory given  Plan Discussed with: CRNA and Anesthesiologist  Anesthesia Plan Comments:         Anesthesia Quick Evaluation

## 2022-09-13 NOTE — Op Note (Signed)
[facility] Patient Name: Kerry Matthews Procedure Date: 09/13/2022 MRN: [id number] Attending MD: Gatha Mayer , MD, [id number] Date of Birth: 1935-09-08 CSN: [id number] Age: 87 Admit Type: Inpatient Procedure:                ERCP Indications:              Suspected bile duct stone(s), For therapy of bile                            duct stone(s) Providers:                Gatha Mayer, MD, Jeanella Cara, RN,                            William Dalton, Technician Referring MD:             [employer] Medicines:                General Anesthesia, On Zosyn                            intermittent/continuos; indomethacin 100 mg per                            rectum for post-ERCP prophylaxis Complications:            No immediate complications. Estimated Blood Loss:     Estimated blood loss: none. Procedure:                Pre-Anesthesia Assessment:                           - Prior to the procedure, a History and Physical                            was performed, and patient medications and                            allergies were reviewed. The patient's tolerance of                            previous anesthesia was also reviewed. The risks                            and benefits of the procedure and the sedation                            options and risks were discussed with the patient.                            All questions were answered, and informed consent                            was obtained. Prior Anticoagulants: The patient has                            taken no anticoagulant or  antiplatelet agents. ASA                            Grade Assessment: III - A patient with severe                            systemic disease. After reviewing the risks and                            benefits, the patient was deemed in satisfactory                            condition to undergo the procedure.                           After obtaining informed consent, the  scope was                            passed under direct vision. Throughout the                            procedure, the patient's blood pressure, pulse, and                            oxygen saturations were monitored continuously. The                            [employer] single use                            duodenoscope was introduced through the mouth, and                            used to inject contrast into and used to inject                            contrast into the bile duct and ventral pancreatic                            duct. The ERCP was accomplished without difficulty.                            The patient tolerated the procedure well. Scope In: Scope Out: Findings:      The scout film was normal. The esophagus was successfully intubated       under direct vision. The scope was advanced to a normal major papilla in       the descending duodenum without detailed examination of the pharynx,       larynx and associated structures, and upper GI tract. Esophagus not seen       well. Stomach and duodenum normal with limitations as described. Papilla       small but overall normal. Free deep cannulation of major papilla and       then wire inserted through sphincterotome. Contrast inkected and 2  mobile stones seen. Max diameter of the CBD/CHD 9 mm. Normal       intrahepatics. Ventral pancreatic duct filled (common channel suspected)       though not intended and was normal. 6-7 mm biliary sphincterotomy       performed over wire and then 9-12 mm balloon used (63m) to remoe 2       stones. occlusion cholangiogram and subsequent sweep negative.       Gallbladder filled and no obvious stones. Impression:               - Choledocholithiasis was found. Complete removal                            (2 stones) was accomplished by biliary                            sphincterotomy and balloon extraction. I think he                            may have passed  a stone prior to the ERCP also                           - The pancreatogram was normal. Moderate Sedation:      Not Applicable - Patient had care per Anesthesia. Recommendation:           - return to ICU - to floor when TRH/CCM recomnmend                            but suspect he is ready                           Duration of antibiotics - ok to dc per me defer [facility]                            [facility] 7 d max                           Clears today and the advance tomorrow if ok                           Don't restart ASA x 7 days                           I would not pursue cholecystectomy based upon what                            I know at this point                           I will see again tomorrow - recheck LFT's, CBC Procedure Code(s):        --- Professional ---                           [id number] Endoscopic retrograde  cholangiopancreatography (ERCP); with removal of                            calculi/debris from biliary/pancreatic duct(s)                           43262, Endoscopic retrograde                            cholangiopancreatography (ERCP); with                            sphincterotomy/papillotomy Diagnosis Code(s):        --- Professional ---                           K80.50, Calculus of bile duct without cholangitis                            or cholecystitis without obstruction CPT copyright 2022 [employer] Association. All rights reserved. The codes documented in this report are preliminary and upon coder review may  be revised to meet current compliance requirements. Gatha Mayer, MD 09/13/2022 12:40:06 PM This report has been signed electronically. Number of Addenda: 0

## 2022-09-13 NOTE — Progress Notes (Signed)
   Patient Name: Kerry Matthews Date of Encounter: 09/13/2022, 7:49 AM    Subjective  No more abd pain but says some tenderness   Objective  BP (!) 118/53   Pulse 60   Temp 98.4 F (36.9 C) (Axillary)   Resp (!) 21   Ht '5\' 6"'$  (1.676 m)   Wt 77 kg   SpO2 94%   BMI 27.40 kg/m  Elderly wm NAD Icteric Lungs cta - ant Cor NL S1S2 no rmg Abd soft w/ sl RUQ tenderness   Recent Labs  Lab 09/11/22 1150 09/11/22 1151 09/11/22 2018 09/12/22 0242 09/13/22 0253  AST  --  274* 163* 136* 77*  ALT  --  194* 154* 137* 103*  ALKPHOS  --  437* 381* 357* 361*  BILITOT  --  3.7* 4.5* 5.3* 4.7*  PROT  --  7.4 5.9* 5.5* 5.9*  ALBUMIN  --  3.9 3.1* 2.9* 3.0*  INR 1.1  --   --   --   --     Lab Results  Component Value Date   LIPASE 26 09/13/2022    Recent Labs  Lab 09/11/22 1151 09/12/22 0242 09/13/22 0253  HGB 15.1 12.8* 12.3*  HCT 45.2 39.0 37.8*  WBC 16.1* 10.9* 11.0*  PLT 249 196 199    Recent Labs  Lab 09/11/22 1151 09/11/22 2018 09/12/22 0242 09/13/22 0253  NA 135 132* 134* 134*  K 4.1 4.3 4.1 3.9  CL 101 100 106 103  CO2 23 22 21* 26  GLUCOSE 143* 129* 103* 83  BUN '18 20 15 16  '$ CREATININE 1.29* 1.15 0.98 1.16  CALCIUM 8.9 8.2* 7.8* 8.1*  MG  --  2.1 1.9 2.0  PHOS  --  3.8 3.6  --     US ABDOMEN LIMITED RUQ (LIVER/GB) CLINICAL DATA:  Choledocholithiasis  EXAM: ULTRASOUND ABDOMEN LIMITED RIGHT UPPER QUADRANT  COMPARISON:  CT 09/11/2022  FINDINGS: Gallbladder:  Nondilated gallbladder without wall thickening or pericholecystic fluid. No significant sludge or intraluminal gallstones identified. Negative sonographic Murphy sign.  Common bile duct:  Diameter: 5 mm, normal. No intrahepatic ductal dilation. No intraductal stone identified in the proximal duct.  Liver:  No focal lesion identified. Within normal limits in parenchymal echogenicity. Portal vein is patent on color Doppler imaging with normal direction of blood flow towards the  liver.  Other: Limited exam due to bowel gas.  IMPRESSION: No evidence biliary obstruction.  Electronically Signed   By: Maurine Simmering M.D.   On: 09/12/2022 15:20      Assessment and Plan  Choledocholithiasis - may have passed - given level of bilirubin and clinical scenario will proceed w/ ERCP today  Gatha Mayer, MD, [facility] See Shea Evans on call - gastroenterology for best contact person 09/13/2022 7:49 AM

## 2022-09-13 NOTE — Interval H&P Note (Signed)
History and Physical Interval Note:  09/13/2022 11:48 AM  Kerry Matthews  has presented today for surgery, with the diagnosis of choledocholithiasis.  The various methods of treatment have been discussed with the patient and family. After consideration of risks, benefits and other options for treatment, the patient has consented to  Procedure(s): ENDOSCOPIC RETROGRADE CHOLANGIOPANCREATOGRAPHY (ERCP) (N/A) as a surgical intervention.  The patient's history has been reviewed, patient examined, no change in status, stable for surgery.  I have reviewed the patient's chart and labs.  Questions were answered to the patient's satisfaction.     Silvano Rusk

## 2022-09-13 NOTE — Transfer of Care (Signed)
Immediate Anesthesia Transfer of Care Note  Patient: Kerry Matthews  Procedure(s) Performed: ENDOSCOPIC RETROGRADE CHOLANGIOPANCREATOGRAPHY (ERCP) SPHINCTEROTOMY REMOVAL OF STONES  Patient Location: PACU  Anesthesia Type:General  Level of Consciousness: awake, alert , oriented, and patient cooperative  Airway & Oxygen Therapy: Patient Spontanous Breathing and Patient connected to nasal cannula oxygen  Post-op Assessment: Report given to RN and Post -op Vital signs reviewed and stable  Post vital signs: Reviewed and stable  Last Vitals:  Vitals Value Taken Time  BP 128/65 09/13/22 1241  Temp 36.7 C 09/13/22 1241  Pulse 76 09/13/22 1245  Resp 29 09/13/22 1245  SpO2 98 % 09/13/22 1245  Vitals shown include unvalidated device data.  Last Pain:  Vitals:   09/13/22 1118  TempSrc: Temporal  PainSc: 0-No pain         Complications: No notable events documented.

## 2022-09-14 ENCOUNTER — Encounter (HOSPITAL_COMMUNITY): Payer: Self-pay | Admitting: Internal Medicine

## 2022-09-14 DIAGNOSIS — N1831 Chronic kidney disease, stage 3a: Secondary | ICD-10-CM | POA: Diagnosis not present

## 2022-09-14 DIAGNOSIS — A419 Sepsis, unspecified organism: Secondary | ICD-10-CM | POA: Diagnosis not present

## 2022-09-14 DIAGNOSIS — K851 Biliary acute pancreatitis without necrosis or infection: Secondary | ICD-10-CM | POA: Diagnosis not present

## 2022-09-14 DIAGNOSIS — K8050 Calculus of bile duct without cholangitis or cholecystitis without obstruction: Secondary | ICD-10-CM | POA: Diagnosis not present

## 2022-09-14 LAB — CBC WITH DIFFERENTIAL/PLATELET
Abs Immature Granulocytes: 0.05 10*3/uL (ref 0.00–0.07)
Basophils Absolute: 0 10*3/uL (ref 0.0–0.1)
Basophils Relative: 0 %
Eosinophils Absolute: 0 10*3/uL (ref 0.0–0.5)
Eosinophils Relative: 0 %
HCT: 37.7 % — ABNORMAL LOW (ref 39.0–52.0)
Hemoglobin: 12.5 g/dL — ABNORMAL LOW (ref 13.0–17.0)
Immature Granulocytes: 1 %
Lymphocytes Relative: 13 %
Lymphs Abs: 1.3 10*3/uL (ref 0.7–4.0)
MCH: 29.1 pg (ref 26.0–34.0)
MCHC: 33.2 g/dL (ref 30.0–36.0)
MCV: 87.7 fL (ref 80.0–100.0)
Monocytes Absolute: 0.5 10*3/uL (ref 0.1–1.0)
Monocytes Relative: 5 %
Neutro Abs: 8.4 10*3/uL — ABNORMAL HIGH (ref 1.7–7.7)
Neutrophils Relative %: 81 %
Platelets: 186 10*3/uL (ref 150–400)
RBC: 4.3 MIL/uL (ref 4.22–5.81)
RDW: 13.5 % (ref 11.5–15.5)
WBC: 10.2 10*3/uL (ref 4.0–10.5)
nRBC: 0 % (ref 0.0–0.2)

## 2022-09-14 LAB — COMPREHENSIVE METABOLIC PANEL WITH GFR
ALT: 76 U/L — ABNORMAL HIGH (ref 0–44)
AST: 41 U/L (ref 15–41)
Albumin: 3.1 g/dL — ABNORMAL LOW (ref 3.5–5.0)
Alkaline Phosphatase: 335 U/L — ABNORMAL HIGH (ref 38–126)
Anion gap: 4 — ABNORMAL LOW (ref 5–15)
BUN: 16 mg/dL (ref 8–23)
CO2: 25 mmol/L (ref 22–32)
Calcium: 8.3 mg/dL — ABNORMAL LOW (ref 8.9–10.3)
Chloride: 104 mmol/L (ref 98–111)
Creatinine, Ser: 1.12 mg/dL (ref 0.61–1.24)
GFR, Estimated: >60 mL/min
Glucose, Bld: 133 mg/dL — ABNORMAL HIGH (ref 70–99)
Potassium: 4.4 mmol/L (ref 3.5–5.1)
Sodium: 133 mmol/L — ABNORMAL LOW (ref 135–145)
Total Bilirubin: 2.1 mg/dL — ABNORMAL HIGH (ref 0.3–1.2)
Total Protein: 5.7 g/dL — ABNORMAL LOW (ref 6.5–8.1)

## 2022-09-14 LAB — MAGNESIUM: Magnesium: 2 mg/dL (ref 1.7–2.4)

## 2022-09-14 LAB — GLUCOSE, CAPILLARY
Glucose-Capillary: 137 mg/dL — ABNORMAL HIGH (ref 70–99)
Glucose-Capillary: 113 mg/dL — ABNORMAL HIGH (ref 70–99)
Glucose-Capillary: 120 mg/dL — ABNORMAL HIGH (ref 70–99)
Glucose-Capillary: 165 mg/dL — ABNORMAL HIGH (ref 70–99)
Glucose-Capillary: 202 mg/dL — ABNORMAL HIGH (ref 70–99)

## 2022-09-14 MED ORDER — POLYETHYLENE GLYCOL 3350 17 G PO PACK
17.0000 g | PACK | Freq: Every day | ORAL | Status: DC
  Administered 2022-09-14: 17 g via ORAL
  Filled 2022-09-14: qty 1

## 2022-09-14 NOTE — Progress Notes (Signed)
   Patient Name: Kerry Matthews Date of Encounter: 09/14/2022, 10:00 AM    Subjective  Feels well after ERCP no pain   Objective  BP (!) 170/70   Pulse (!) 31   Temp 98 F (36.7 C) (Oral)   Resp 14   Ht '5\' 6"'$  (1.676 m)   Wt 77 kg   SpO2 92%   BMI 27.40 kg/m  NAD Still jaundiced - less so Abd soft and NT   Recent Labs  Lab 09/11/22 1150 09/11/22 1151 09/11/22 2018 09/12/22 0242 09/13/22 0253 09/14/22 0314  AST  --  274* 163* 136* 77* 41  ALT  --  194* 154* 137* 103* 76*  ALKPHOS  --  437* 381* 357* 361* 335*  BILITOT  --  3.7* 4.5* 5.3* 4.7* 2.1*  PROT  --  7.4 5.9* 5.5* 5.9* 5.7*  ALBUMIN  --  3.9 3.1* 2.9* 3.0* 3.1*  INR 1.1  --   --   --   --   --    Recent Labs  Lab 09/12/22 0242 09/13/22 0253 09/14/22 0314  HGB 12.8* 12.3* 12.5*  HCT 39.0 37.8* 37.7*  WBC 10.9* 11.0* 10.2  PLT 196 199 186       Assessment and Plan  Treated choledocholithiasis - improved  DC Abx Soft low fat diet Hold ASA x 7 d F/U PCP and see GI only if needed - can recheck LFT's at PCP  Gatha Mayer, MD, [facility] See Shea Evans on call - gastroenterology for best contact person 09/14/2022 10:00 AM

## 2022-09-14 NOTE — Progress Notes (Signed)
PROGRESS NOTE    Kerry Matthews  PPJ:[id number] DOB: 1936-01-15 DOA: 09/11/2022 PCP: [name], MD    Chief Complaint  Patient presents with   Chest Pain    Brief Narrative:  Patient is a pleasant 87 year old gentleman, history of pulmonary fibrosis/interstitial lung disease, alpha-1 antitrypsin deficiency, emphysema, connective tissue disease, diabetes, hypertension, CAD, CKD, microscopic polyangiitis presenting with sudden onset epigastric chest pain radiating to his back started on the morning of admission after eating his breakfast.  Patient seen in the ED CT chest abdomen and pelvis done concerning for choledocholithiasis.  Labs obtained in the ED with a transaminitis, elevated lipase levels in the 600s.  Patient placed empirically on IV fluids, IV antibiotics in the ED, noted to have elevated leukocytosis and noted to have been developing a tachycardia tachypnea concern for early developing sepsis and admitted to the hospital for further evaluation.  ED physician spoke with GI who had recommended admission and need for possible ERCP.  Patient admitted to stepdown unit.   Assessment & Plan:   Principal Problem:   Choledocholithiasis Active Problems:   SIRS (systemic inflammatory response syndrome) (HCC)   Acute gallstone pancreatitis   Chronic kidney disease, stage 3a (HCC)   Diabetic polyneuropathy associated with type 2 diabetes mellitus (HCC)   Mixed hyperlipidemia   Coronary artery disease involving native coronary artery of native heart without angina pectoris   Interstitial pulmonary disease (HCC)   Type 2 diabetes mellitus with hyperglycemia, with long-term current use of insulin ([place])   1.  Choledocholithiasis/acute gallstone pancreatitis -Patient had presented with sudden onset epigastric abdominal pain with radiation to the back.  Patient noted to have a transaminitis, lipase levels elevated at 653 on admission with significant decrease to 26 today.??  Passed  gallstones. -LFTs trending down, patient with clinical improvement. -CT gram chest/abdomen and pelvis with concerns for choledocholithiasis, distal colonic diverticulosis without diverticulitis, fat-containing right inguinal hernia, no evidence of pulmonary embolus, no evidence of thoracoabdominal aortic aneurysm or dissection. -Patient jaundiced on examination. -Patient with a transaminitis on presentation which seems to be slowly trending down. -GI consulted right upper quadrant ultrasound obtained with no evidence of biliary obstruction. -Status post ERCP 09/13/2022 with choledocholithiasis noted status post complete removal of 2 stones with biliary sphincterotomy and balloon extraction without any complications.   -Patient may have passed a stone.   -LFTs trending down.   -Patient currently afebrile, leukocytosis has trended down.   -Tolerating clear liquids diet to be advanced to a low-fat diet per GI today.   -Discontinue IV Zosyn.   -Mobilize.   -GI following and appreciate input and recommendations.  #2  SIRS, sepsis ruled out. -Patient met criteria for SIRS on admission due to tachycardia, tachypnea, leukocytosis, patient had presented with concerns for choledocholithiasis with sudden onset epigastric abdominal pain radiating to the back, significantly elevated lipase levels concerning for gallstone pancreatitis with transaminitis. -Patient pancultured, blood cultures pending with no growth to date x 3 days, urinalysis done unremarkable.  Patient afebrile, leukocytosis trended down. -Discontinue IV Zosyn. -GI following. -See problem #2.  3.  CKD stage IIIa -Renal function stable.  4.  Hyperlipidemia -Continue to hold statin as patient with a transaminitis and likely resume 1 week postdischarge.  5.  Interstitial pulmonary disease -Patient with diffuse fine crackles. -Chest x-ray no significant change from prior chest x-ray. -Patient was on IV fluids on admission which has  subsequently been discontinued.  -Continue incentive spirometry, flutter valve.   -Outpatient follow-up with primary pulmonologist and  rheumatologist.  6.  Type 2 diabetes with hyperglycemia -Hemoglobin A1c 6.1 (07/08/2022) -CBGs of 137 this morning. -Continue to hold home regimen oral hypoglycemic agents. -SSI. -Continue home regimen Neurontin.   7.  CAD -Stable. -Aspirin and statin on hold, likely resume on discharge.    DVT prophylaxis: SCDs Code Status: Full Family Communication: Updated patient, wife, daughter at bedside. Disposition: Transfer to [facility].  Likely home once tolerating diet, cleared by GI hopefully in the next 24 hours.   Status is: Inpatient Remains inpatient appropriate because: Severity of illness   Consultants:  Gastroenterology: Dr. Carlean Purl 09/12/2020  Procedures:  CT angiogram chest abdomen and pelvis 09/11/2022 Chest x-ray 09/11/2022 Right upper quadrant ultrasound 09/12/2022 ERCP: Dr. Carlean Purl 09/13/2022  Antimicrobials:  IV Zosyn 09/11/2022>>>>>>   Subjective: Sitting up in bed.  Overall feels better.  Denies any abdominal pain.  No nausea or vomiting.  No chest pain.  Denies any abdominal pain.  Tolerating clear liquids.  Daughter and wife at bedside.    Objective: Vitals:   09/14/22 0400 09/14/22 0600 09/14/22 0800 09/14/22 0837  BP: (!) 128/53 133/74 (!) 170/70   Pulse: (!) 50 (!) 57 (!) 31   Resp: 14     Temp:    98 F (36.7 C)  TempSrc:    Oral  SpO2: 96% 96% 92%   Weight:      Height:        Intake/Output Summary (Last 24 hours) at 09/14/2022 1003 Last data filed at 09/14/2022 0910 Gross per 24 hour  Intake 1824.44 ml  Output 800 ml  Net 1024.44 ml   Filed Weights   09/11/22 1135 09/11/22 1800  Weight: 78.9 kg 77 kg    Examination:  General exam: Less jaundiced.  NAD.  Respiratory system: Diffuse fine crackles.  No wheezing, no rhonchi, fair air movement.  Speaking in full sentences.  Cardiovascular system: RRR no murmurs  rubs or gallops.  No JVD.  No lower extremity edema.  Gastrointestinal system: Abdomen is soft, nontender, nondistended, positive bowel sounds.  No rebound.  No guarding.  Central nervous system: Alert and oriented. No focal neurological deficits. Extremities: Symmetric 5 x 5 power. Skin: Jaundiced.  Psychiatry: Judgement and insight appear normal. Mood & affect appropriate.     Data Reviewed: I have personally reviewed following labs and imaging studies  CBC: Recent Labs  Lab 09/11/22 1151 09/11/22 1400 09/12/22 0242 09/13/22 0253 09/14/22 0314  WBC 16.1*  --  10.9* 11.0* 10.2  NEUTROABS  --  10.9*  --  6.8 8.4*  HGB 15.1  --  12.8* 12.3* 12.5*  HCT 45.2  --  39.0 37.8* 37.7*  MCV 86.9  --  86.9 88.7 87.7  PLT 249  --  196 199 008    Basic Metabolic Panel: Recent Labs  Lab 09/11/22 1151 09/11/22 2018 09/12/22 0242 09/13/22 0253 09/14/22 0314  NA 135 132* 134* 134* 133*  K 4.1 4.3 4.1 3.9 4.4  CL 101 100 106 103 104  CO2 23 22 21* 26 25  GLUCOSE 143* 129* 103* 83 133*  BUN '18 20 15 16 16  '$ CREATININE 1.29* 1.15 0.98 1.16 1.12  CALCIUM 8.9 8.2* 7.8* 8.1* 8.3*  MG  --  2.1 1.9 2.0 2.0  PHOS  --  3.8 3.6  --   --     GFR: Estimated Creatinine Clearance: 48 mL/min (by C-G formula based on SCr of 1.12 mg/dL).  Liver Function Tests: Recent Labs  Lab 09/11/22 1151 09/11/22 2018  09/12/22 0242 09/13/22 0253 09/14/22 0314  AST 274* 163* 136* 77* 41  ALT 194* 154* 137* 103* 76*  ALKPHOS 437* 381* 357* 361* 335*  BILITOT 3.7* 4.5* 5.3* 4.7* 2.1*  PROT 7.4 5.9* 5.5* 5.9* 5.7*  ALBUMIN 3.9 3.1* 2.9* 3.0* 3.1*    CBG: Recent Labs  Lab 09/13/22 1544 09/13/22 2037 09/13/22 2335 09/14/22 0339 09/14/22 0758  GLUCAP 211* 154* 128* 137* 113*     Recent Results (from the past 240 hour(s))  Resp panel by RT-PCR (RSV, Flu A&B, Covid) Nasopharyngeal Swab     Status: None   Collection Time: 09/11/22 11:51 AM   Specimen: Nasopharyngeal Swab; Nasal Swab  Result  Value Ref Range Status   SARS Coronavirus 2 by RT PCR NEGATIVE NEGATIVE Final    Comment: (NOTE) SARS-CoV-2 target nucleic acids are NOT DETECTED.  The SARS-CoV-2 RNA is generally detectable in upper respiratory specimens during the acute phase of infection. The lowest concentration of SARS-CoV-2 viral copies this assay can detect is 138 copies/mL. A negative result does not preclude SARS-Cov-2 infection and should not be used as the sole basis for treatment or other patient management decisions. A negative result may occur with  improper specimen collection/handling, submission of specimen other than nasopharyngeal swab, presence of viral mutation(s) within the areas targeted by this assay, and inadequate number of viral copies(<138 copies/mL). A negative result must be combined with clinical observations, patient history, and epidemiological information. The expected result is Negative.  Fact Sheet for Patients:  EntrepreneurPulse.com.au  Fact Sheet for Healthcare Providers:  IncredibleEmployment.be  This test is no t yet approved or cleared by the [place] FDA and  has been authorized for detection and/or diagnosis of SARS-CoV-2 by FDA under an Emergency Use Authorization (EUA). This EUA will remain  in effect (meaning this test can be used) for the duration of the COVID-19 declaration under Section 564(b)(1) of the Act, 21 U.S.C.section 360bbb-3(b)(1), unless the authorization is terminated  or revoked sooner.       Influenza A by PCR NEGATIVE NEGATIVE Final   Influenza B by PCR NEGATIVE NEGATIVE Final    Comment: (NOTE) The Xpert Xpress SARS-CoV-2/FLU/RSV plus assay is intended as an aid in the diagnosis of influenza from Nasopharyngeal swab specimens and should not be used as a sole basis for treatment. Nasal washings and aspirates are unacceptable for Xpert Xpress SARS-CoV-2/FLU/RSV testing.  Fact Sheet for  Patients: EntrepreneurPulse.com.au  Fact Sheet for Healthcare Providers: IncredibleEmployment.be  This test is not yet approved or cleared by the [place] FDA and has been authorized for detection and/or diagnosis of SARS-CoV-2 by FDA under an Emergency Use Authorization (EUA). This EUA will remain in effect (meaning this test can be used) for the duration of the COVID-19 declaration under Section 564(b)(1) of the Act, 21 U.S.C. section 360bbb-3(b)(1), unless the authorization is terminated or revoked.     Resp Syncytial Virus by PCR NEGATIVE NEGATIVE Final    Comment: (NOTE) Fact Sheet for Patients: EntrepreneurPulse.com.au  Fact Sheet for Healthcare Providers: IncredibleEmployment.be  This test is not yet approved or cleared by the [place] FDA and has been authorized for detection and/or diagnosis of SARS-CoV-2 by FDA under an Emergency Use Authorization (EUA). This EUA will remain in effect (meaning this test can be used) for the duration of the COVID-19 declaration under Section 564(b)(1) of the Act, 21 U.S.C. section 360bbb-3(b)(1), unless the authorization is terminated or revoked.  Performed at [facility], [place],  [place], [place] [address]   Blood culture (routine x 2)     Status: None (Preliminary result)   Collection Time: 09/11/22 12:45 PM   Specimen: BLOOD RIGHT WRIST  Result Value Ref Range Status   Specimen Description   Final    BLOOD RIGHT WRIST Performed at [facility] [address], [place], [place] [address]    Special Requests   Final    Blood Culture adequate volume BOTTLES DRAWN AEROBIC AND ANAEROBIC Performed at [facility], [address] [place], [place] [address]    Culture   Final    NO GROWTH 3 DAYS Performed at [facility], [place] [address] [place], [place] [address]    Report Status PENDING  Incomplete  MRSA  Next Gen by PCR, Nasal     Status: None   Collection Time: 09/11/22  6:42 PM   Specimen: Nasal Mucosa; Nasal Swab  Result Value Ref Range Status   MRSA by PCR Next Gen NOT DETECTED NOT DETECTED Final    Comment: (NOTE) The GeneXpert MRSA Assay (FDA approved for NASAL specimens only), is one component of a comprehensive MRSA colonization surveillance program. It is not intended to diagnose MRSA infection nor to guide or monitor treatment for MRSA infections. Test performance is not FDA approved in patients less than 54 years old. Performed at [facility], [place] [address], [place], [place] [address]          Radiology Studies: DG ERCP  Result Date: 09/14/2022 CLINICAL DATA:  Choledocholithiasis EXAM: ERCP TECHNIQUE: Multiple spot images obtained with the fluoroscopic device and submitted for interpretation post-procedure. FLUOROSCOPY: Radiation Exposure Index (as provided by the fluoroscopic device): 26.71 mGy Kerma COMPARISON:  Abdominal ultrasound 09/12/2022 FINDINGS: A total of 5 intraoperative saved images are submitted for review. The images demonstrate a flexible duodenal scope in the descending duodenum followed by wire cannulation of the common bile duct. Cholangiogram demonstrates filling defects consistent with choledocholithiasis. Subsequent imaging demonstrates balloon sweeping of the common duct. IMPRESSION: 1. Choledocholithiasis. 2. ERCP with balloon sweeping of the common duct. These images were submitted for radiologic interpretation only. Please see the procedural report for the amount of contrast and the fluoroscopy time utilized. Electronically Signed   By: Jacqulynn Cadet M.D.   On: 09/14/2022 08:17   US ABDOMEN LIMITED RUQ (LIVER/GB)  Result Date: 09/12/2022 CLINICAL DATA:  Choledocholithiasis EXAM: ULTRASOUND ABDOMEN LIMITED RIGHT UPPER QUADRANT COMPARISON:  CT 09/11/2022 FINDINGS: Gallbladder: Nondilated gallbladder without wall thickening or  pericholecystic fluid. No significant sludge or intraluminal gallstones identified. Negative sonographic Murphy sign. Common bile duct: Diameter: 5 mm, normal. No intrahepatic ductal dilation. No intraductal stone identified in the proximal duct. Liver: No focal lesion identified. Within normal limits in parenchymal echogenicity. Portal vein is patent on color Doppler imaging with normal direction of blood flow towards the liver. Other: Limited exam due to bowel gas. IMPRESSION: No evidence biliary obstruction. Electronically Signed   By: Maurine Simmering M.D.   On: 09/12/2022 15:20        Scheduled Meds:  Chlorhexidine Gluconate Cloth  6 each Topical Daily   gabapentin  400 mg Oral BID   insulin aspart  0-9 Units Subcutaneous Q4H   Continuous Infusions:     LOS: 3 days    Time spent: 40 minutes    Irine Seal, MD [facility]   To contact the attending provider between 7A-7P or the covering provider during after hours 7P-7A, please  log into the web site [email or web address] and access using universal [employer] password for that web site. If you do not have the password, please call the hospital operator.  09/14/2022, 10:03 AM

## 2022-09-15 ENCOUNTER — Ambulatory Visit: Payer: Medicare HMO | Admitting: Podiatry

## 2022-09-15 DIAGNOSIS — N1831 Chronic kidney disease, stage 3a: Secondary | ICD-10-CM | POA: Diagnosis not present

## 2022-09-15 DIAGNOSIS — K851 Biliary acute pancreatitis without necrosis or infection: Secondary | ICD-10-CM | POA: Diagnosis not present

## 2022-09-15 DIAGNOSIS — E1165 Type 2 diabetes mellitus with hyperglycemia: Secondary | ICD-10-CM | POA: Diagnosis not present

## 2022-09-15 DIAGNOSIS — K8050 Calculus of bile duct without cholangitis or cholecystitis without obstruction: Secondary | ICD-10-CM | POA: Diagnosis not present

## 2022-09-15 DIAGNOSIS — R6510 Systemic inflammatory response syndrome (SIRS) of non-infectious origin without acute organ dysfunction: Secondary | ICD-10-CM

## 2022-09-15 LAB — COMPREHENSIVE METABOLIC PANEL
ALT: 56 U/L — ABNORMAL HIGH (ref 0–44)
AST: 29 U/L (ref 15–41)
Albumin: 3.1 g/dL — ABNORMAL LOW (ref 3.5–5.0)
Alkaline Phosphatase: 279 U/L — ABNORMAL HIGH (ref 38–126)
Anion gap: 5 (ref 5–15)
BUN: 19 mg/dL (ref 8–23)
CO2: 27 mmol/L (ref 22–32)
Calcium: 8.2 mg/dL — ABNORMAL LOW (ref 8.9–10.3)
Chloride: 105 mmol/L (ref 98–111)
Creatinine, Ser: 1.12 mg/dL (ref 0.61–1.24)
GFR, Estimated: >60 mL/min (ref >60)
Glucose, Bld: 105 mg/dL — ABNORMAL HIGH (ref 70–99)
Potassium: 4.3 mmol/L (ref 3.5–5.1)
Sodium: 137 mmol/L (ref 135–145)
Total Bilirubin: 1.4 mg/dL — ABNORMAL HIGH (ref 0.3–1.2)
Total Protein: 5.7 g/dL — ABNORMAL LOW (ref 6.5–8.1)

## 2022-09-15 LAB — CBC WITH DIFFERENTIAL/PLATELET
Abs Immature Granulocytes: 0.04 10*3/uL (ref 0.00–0.07)
Basophils Absolute: 0 10*3/uL (ref 0.0–0.1)
Basophils Relative: 0 %
Eosinophils Absolute: 0.6 10*3/uL — ABNORMAL HIGH (ref 0.0–0.5)
Eosinophils Relative: 5 %
HCT: 36.8 % — ABNORMAL LOW (ref 39.0–52.0)
Hemoglobin: 12.3 g/dL — ABNORMAL LOW (ref 13.0–17.0)
Immature Granulocytes: 0 %
Lymphocytes Relative: 21 %
Lymphs Abs: 2.4 10*3/uL (ref 0.7–4.0)
MCH: 29.4 pg (ref 26.0–34.0)
MCHC: 33.4 g/dL (ref 30.0–36.0)
MCV: 88 fL (ref 80.0–100.0)
Monocytes Absolute: 1.2 10*3/uL — ABNORMAL HIGH (ref 0.1–1.0)
Monocytes Relative: 10 %
Neutro Abs: 7.3 10*3/uL (ref 1.7–7.7)
Neutrophils Relative %: 64 %
Platelets: 233 10*3/uL (ref 150–400)
RBC: 4.18 MIL/uL — ABNORMAL LOW (ref 4.22–5.81)
RDW: 13.8 % (ref 11.5–15.5)
WBC: 11.5 10*3/uL — ABNORMAL HIGH (ref 4.0–10.5)
nRBC: 0 % (ref 0.0–0.2)

## 2022-09-15 LAB — GLUCOSE, CAPILLARY
Glucose-Capillary: 111 mg/dL — ABNORMAL HIGH (ref 70–99)
Glucose-Capillary: 123 mg/dL — ABNORMAL HIGH (ref 70–99)
Glucose-Capillary: 202 mg/dL — ABNORMAL HIGH (ref 70–99)
Glucose-Capillary: 91 mg/dL (ref 70–99)

## 2022-09-15 MED ORDER — ROSUVASTATIN CALCIUM 20 MG PO TABS: 20.0000 mg | ORAL_TABLET | Freq: Every day | ORAL | 1 refills | Status: DC

## 2022-09-15 MED ORDER — LUBRIDERM SERIOUSLY SENSITIVE EX LOTN: 1.0000 | TOPICAL_LOTION | CUTANEOUS | 0 refills | Status: DC | PRN

## 2022-09-15 MED ORDER — ASPIRIN EC 81 MG PO TBEC: 81.0000 mg | DELAYED_RELEASE_TABLET | Freq: Every day | ORAL | 11 refills | Status: AC

## 2022-09-15 NOTE — Discharge Summary (Signed)
Physician Discharge Summary  Ramzi Brathwaite ZOX:[id number] DOB: [birth year] DOA: 09/11/2022  PCP: [name], MD  Admit date: 09/11/2022 Discharge date: 09/15/2022  Time spent: 55 minutes  Recommendations for Outpatient Follow-up:  Follow-up with [name], MD in 1 to 2 weeks.  On follow-up patient need a comprehensive metabolic profile done to follow-up on electrolytes, renal function, LFTs.  Patient need a CBC done to follow-up on counts.  On follow-up will defer resumption of statin to PCP based on LFTs.    Discharge Diagnoses:  Principal Problem:   Choledocholithiasis Active Problems:   SIRS (systemic inflammatory response syndrome) (HCC)   Acute gallstone pancreatitis   Chronic kidney disease, stage 3a (HCC)   Diabetic polyneuropathy associated with type 2 diabetes mellitus (HCC)   Mixed hyperlipidemia   Coronary artery disease involving native coronary artery of native heart without angina pectoris   Interstitial pulmonary disease (HCC)   Type 2 diabetes mellitus with hyperglycemia, with long-term current use of insulin ([place])   Discharge Condition: Stable and improved.  Diet recommendation: Low-fat diet/carb modified diet  Filed Weights   09/11/22 1135 09/11/22 1800  Weight: 78.9 kg 77 kg    History of present illness:  HPI per Dr. Herminio Commons Nicotra is a 87 y.o. male with medical history significant of interstitial lung disease on home O2 as needed, CAD, hypertension, diabetes, CKD, microscopic polyangiitis   Presented with epigastric/chest pain Pt with epigastric pain radiating to the back starting this morning.  After eating breakfast.  He tried to use over-the-counter PPI but did not seem to help so he went to emergency department.  At ED CT scan showed evidence of choledocholithiasis patient will need further imaging [facility] has been notified Dr. Shelton Silvas will see patient in consult tomorrow. Patient was concerning for having sepsis started on Rocephin IV  fluid given  Family reports low grade fever and chills Family reports he had episode of indigesting few days ago after eating oranges  No vomiting some nausea no diarrhea  Reports burning epigastric pain       Initial COVID TEST  NEGATIVE  Hospital Course:  1.  Choledocholithiasis/acute gallstone pancreatitis -Patient had presented with sudden onset epigastric abdominal pain with radiation to the back.  Patient noted to have a transaminitis, lipase levels elevated at 653 on admission with significant decrease to 26 today.??  Passed gallstones. -LFTs trended down, patient with clinical improvement. -CT angiogram chest/abdomen and pelvis was done on admission, with concerns for choledocholithiasis, distal colonic diverticulosis without diverticulitis, fat-containing right inguinal hernia, no evidence of pulmonary embolus, no evidence of thoracoabdominal aortic aneurysm or dissection. -Patient jaundiced on examination early on during the hospitalization which improved.. -Patient with a transaminitis on presentation which seems to be slowly trending down. -GI consulted, right upper quadrant ultrasound obtained with no evidence of biliary obstruction. -Status post ERCP 09/13/2022 with choledocholithiasis noted status post complete removal of 2 stones with biliary sphincterotomy and balloon extraction without any complications.   -Patient may have passed a stone.  -Patient remained afebrile, leukocytosis has trended down/fluctuated.  Patient remained afebrile. -Patient started on clear liquids post ERCP and diet advanced to low-fat diet which patient tolerated. -IV antibiotics subsequently discontinued 1 day after ERCP was done per GI recommendations and no further antibiotics recommended per GI. -GI recommended resuming aspirin 7 days postdischarge, outpatient follow-up with PCP for repeat LFTs may follow-up with GI on a as needed basis. -Patient be discharged in stable and improved condition.Marland Kitchen      #  2  SIRS, sepsis ruled out. -Patient met criteria for SIRS on admission due to tachycardia, tachypnea, leukocytosis, patient had presented with concerns for choledocholithiasis with sudden onset epigastric abdominal pain radiating to the back, significantly elevated lipase levels concerning for gallstone pancreatitis with transaminitis. -Patient pancultured, blood cultures with no growth to date x 4 days, urinalysis done was unremarkable, patient remained afebrile, leukocytosis trended down/fluctuated.  -Mains on Zosyn during the hospitalization which was discontinued on the following day after ERCP was done.   -GI felt no further antibiotics needed at this time.   -Outpatient follow-up with PCP.    3.  CKD stage IIIa -Remained stable during the hospitalization.   4.  Hyperlipidemia -Statin held as patient presented with a transaminitis and will be held on discharge until follow-up labs done with PCP and patient cleared by PCP for resumption of statin.   5.  Interstitial pulmonary disease -Patient with diffuse fine crackles. -Chest x-ray no significant change from prior chest x-ray. -Patient was on IV fluids on admission which were subsequently discontinued.  -Patient maintained on incentive spirometry, flutter valve during the hospitalization.   -Outpatient follow-up with primary pulmonologist and rheumatologist.   6.  Type 2 diabetes with hyperglycemia -Hemoglobin A1c 6.1 (07/08/2022) -Patient's blood glucose levels well-controlled during the hospitalization on sliding scale insulin.  Patient also maintained on home regimen Neurontin.   -Patient's home regimen oral hypoglycemic agents were held during the hospitalization will be resumed on discharge.  -Continue home regimen Neurontin.    7.  CAD -Stable. -Aspirin and statin the hospitalization.   -Aspirin to be resumed 7 days postdischarge, statin will be held until follow-up with PCP when repeat labs are done to determine when statin  may be resumed.  Will defer resumption of statin to PCP.      Procedures: CT angiogram chest abdomen and pelvis 09/11/2022 Chest x-ray 09/11/2022 Right upper quadrant ultrasound 09/12/2022 ERCP: Dr. Carlean Purl 09/13/2022    Consultations: Gastroenterology: Dr. Carlean Purl 09/12/2020   Discharge Exam: Vitals:   09/14/22 1707 09/14/22 1957  BP: (!) 131/58 (!) 116/59  Pulse: (!) 55 (!) 57  Resp: 20 16  Temp: 97.6 F (36.4 C) 97.6 F (36.4 C)  SpO2: 96% 95%    General: NAD Cardiovascular: RRR no murmurs rubs or gallops.  No JVD.  No lower extremity edema. Respiratory: Bibasilar crackles.  No wheezing.  Fair air movement.  Speaking in full sentences.  Discharge Instructions   Discharge Instructions     Diet - low sodium heart healthy   Complete by: As directed    Diet Carb Modified   Complete by: As directed    Increase activity slowly   Complete by: As directed       Allergies as of 09/15/2022       Reactions   Ace Inhibitors Other (See Comments)   = Slow heart rate   Hydrocodone Nausea And Vomiting   Hydrocodone-acetaminophen Nausea And Vomiting   Nsaids Other (See Comments)   Kidney issues   Sulfamethoxazole Nausea And Vomiting   Sulfamethoxazole-trimethoprim Nausea And Vomiting   Trimethoprim Other (See Comments)   Reaction not recalled        Medication List     TAKE these medications    aspirin EC 81 MG tablet Take 1 tablet (81 mg total) by mouth daily. Swallow whole. Start taking on: September 22, 2022 What changed: These instructions start on September 22, 2022. If you are unsure what to do until then, ask your doctor  or other care provider.   Coenzyme Q10 100 MG Tabs Take 100 mg by mouth at bedtime.   colchicine 0.6 MG tablet Take 1 tablet (0.6 mg total) by mouth 2 (two) times daily. With food   cyanocobalamin 1000 MCG tablet Commonly known as: VITAMIN B12 Take 1 tablet (1,000 mcg total) by mouth daily.   furosemide 20 MG tablet Commonly known as:  LASIX TAKE 1 TABLET EVERY DAY AS NEEDED What changed: See the new instructions.   gabapentin 400 MG capsule Commonly known as: NEURONTIN TAKE 1 CAPSULE THREE TIMES DAILY What changed: See the new instructions.   lubriderm seriously sensitive Lotn Apply 1 Application topically as needed (itching).   metFORMIN 500 MG tablet Commonly known as: GLUCOPHAGE Take 1 tablet by mouth once daily with breakfast What changed:  how much to take when to take this   nitroGLYCERIN 0.4 MG SL tablet Commonly known as: NITROSTAT Place 1 tablet (0.4 mg total) under the tongue every 5 (five) minutes as needed for chest pain.   omeprazole 20 MG capsule Commonly known as: PRILOSEC TAKE 1 CAPSULE EVERY DAY   OXYGEN Inhale 1 L into the lungs at bedtime.   polyethylene glycol 17 g packet Commonly known as: MIRALAX / GLYCOLAX Take 17 g by mouth daily.   PreserVision AREDS 2 Caps Take 1 capsule by mouth 2 (two) times daily after a meal.   Rituxan 500 MG/50ML injection Generic drug: riTUXimab Inject into the vein every 6 (six) months.   rosuvastatin 20 MG tablet Commonly known as: CRESTOR Take 1 tablet (20 mg total) by mouth daily. Start taking on: September 29, 2022 What changed: These instructions start on September 29, 2022. If you are unsure what to do until then, ask your doctor or other care provider.   True Metrix Blood Glucose Test test strip Generic drug: glucose blood TEST BLOOD SUGAR THREE TIMES DAILY BEFORE MEALS       Allergies  Allergen Reactions   Ace Inhibitors Other (See Comments)    = Slow heart rate   Hydrocodone Nausea And Vomiting   Hydrocodone-Acetaminophen Nausea And Vomiting   Nsaids Other (See Comments)    Kidney issues   Sulfamethoxazole Nausea And Vomiting   Sulfamethoxazole-Trimethoprim Nausea And Vomiting   Trimethoprim Other (See Comments)    Reaction not recalled    Follow-up Information     Cox, Kirsten, MD. Schedule an appointment as soon as  possible for a visit in 1 week(s).   Specialty: Family Medicine Why: Follow-up in 1 to 2 weeks. Will need CMET, CBC checked on follow-up. Contact information: [address] [place] [address] [phone]                  The results of significant diagnostics from this hospitalization (including imaging, microbiology, ancillary and laboratory) are listed below for reference.    Significant Diagnostic Studies: DG ERCP  Result Date: 09/14/2022 CLINICAL DATA:  Choledocholithiasis EXAM: ERCP TECHNIQUE: Multiple spot images obtained with the fluoroscopic device and submitted for interpretation post-procedure. FLUOROSCOPY: Radiation Exposure Index (as provided by the fluoroscopic device): 26.71 mGy Kerma COMPARISON:  Abdominal ultrasound 09/12/2022 FINDINGS: A total of 5 intraoperative saved images are submitted for review. The images demonstrate a flexible duodenal scope in the descending duodenum followed by wire cannulation of the common bile duct. Cholangiogram demonstrates filling defects consistent with choledocholithiasis. Subsequent imaging demonstrates balloon sweeping of the common duct. IMPRESSION: 1. Choledocholithiasis. 2. ERCP with balloon sweeping of the common  duct. These images were submitted for radiologic interpretation only. Please see the procedural report for the amount of contrast and the fluoroscopy time utilized. Electronically Signed   By: Jacqulynn Cadet M.D.   On: 09/14/2022 08:17   US ABDOMEN LIMITED RUQ (LIVER/GB)  Result Date: 09/12/2022 CLINICAL DATA:  Choledocholithiasis EXAM: ULTRASOUND ABDOMEN LIMITED RIGHT UPPER QUADRANT COMPARISON:  CT 09/11/2022 FINDINGS: Gallbladder: Nondilated gallbladder without wall thickening or pericholecystic fluid. No significant sludge or intraluminal gallstones identified. Negative sonographic Murphy sign. Common bile duct: Diameter: 5 mm, normal. No intrahepatic ductal dilation. No intraductal stone identified  in the proximal duct. Liver: No focal lesion identified. Within normal limits in parenchymal echogenicity. Portal vein is patent on color Doppler imaging with normal direction of blood flow towards the liver. Other: Limited exam due to bowel gas. IMPRESSION: No evidence biliary obstruction. Electronically Signed   By: Maurine Simmering M.D.   On: 09/12/2022 15:20   CT Angio Chest/Abd/Pel for Dissection W and/or Wo Contrast  Result Date: 09/11/2022 CLINICAL DATA:  Chest pain since Saturday, radiating to back, dyspnea on exertion, nausea, dizziness EXAM: CT ANGIOGRAPHY CHEST, ABDOMEN AND PELVIS TECHNIQUE: Non-contrast CT of the chest was initially obtained. Multidetector CT imaging through the chest, abdomen and pelvis was performed using the standard protocol during bolus administration of intravenous contrast. Multiplanar reconstructed images and MIPs were obtained and reviewed to evaluate the vascular anatomy. RADIATION DOSE REDUCTION: This exam was performed according to the departmental dose-optimization program which includes automated exposure control, adjustment of the mA and/or kV according to patient size and/or use of iterative reconstruction technique. CONTRAST:  167m OMNIPAQUE IOHEXOL 350 MG/ML SOLN COMPARISON:  01/28/2022, 09/11/2022 FINDINGS: CTA CHEST FINDINGS Cardiovascular: No evidence of thoracic aortic aneurysm or dissection. Atherosclerosis of the aortic arch is again noted. The heart is unremarkable without pericardial effusion. While not optimized for the opacification of the pulmonary vasculature, there is adequate opacification to exclude central and segmental pulmonary emboli. Mediastinum/Nodes: No enlarged mediastinal, hilar, or axillary lymph nodes. Thyroid gland, trachea, and esophagus demonstrate no significant findings. Lungs/Pleura: Basilar predominant subpleural scarring and fibrosis unchanged. No acute airspace disease, effusion, or pneumothorax. The central airways are patent.  Musculoskeletal: There are no acute displaced fractures. Reconstructed images demonstrate no additional findings. Review of the MIP images confirms the above findings. CTA ABDOMEN AND PELVIS FINDINGS VASCULAR Aorta: Normal caliber aorta without aneurysm, dissection, vasculitis or significant stenosis. Mild atherosclerosis. Celiac: Patent without evidence of aneurysm, dissection, vasculitis or significant stenosis. Mild ostial atherosclerosis without flow limiting stenosis. SMA: Patent without evidence of aneurysm, dissection, vasculitis or significant stenosis. Mild ostial atherosclerosis without flow limiting stenosis. Renals: There is moderate atherosclerosis at the origin of the bilateral renal arteries, right greater than left. Right renal artery stenosis estimated 50-70%, with left renal artery stenosis estimated 50%. No aneurysm, dissection, vasculitis, or evidence of fibromuscular dysplasia. IMA: Patent without evidence of aneurysm, dissection, vasculitis or significant stenosis. Inflow: Patent without evidence of aneurysm, dissection, vasculitis or significant stenosis. Veins: No obvious venous abnormality within the limitations of this arterial phase study. Review of the MIP images confirms the above findings. NON-VASCULAR Hepatobiliary: Respiratory motion limits evaluation. There are no focal liver abnormalities. Gallbladder is moderately distended without evidence of cholelithiasis or cholecystitis. Common bile duct measures up to 9 mm in diameter, which may be appropriate for age. Within the downstream common bile duct near the ampulla, reference image 100/6, there is a 10 mm hyperdense structure. This could reflect mucosal enhancement or choledocholithiasis. If biliary  pathology is clinically suspected, MRCP could be performed. Pancreas: Diffuse fatty atrophy of the pancreas. Spleen: Normal in size without focal abnormality. Adrenals/Urinary Tract: Adrenal glands are unremarkable. Kidneys are normal,  without renal calculi, focal lesion, or hydronephrosis. Bladder is unremarkable. Stomach/Bowel: No bowel obstruction or ileus. Diverticulosis of the distal colon without diverticulitis. No bowel wall thickening or inflammatory change. Lymphatic: No pathologic adenopathy within the abdomen or pelvis. Reproductive: Prostate is unremarkable. Other: No free fluid or free intraperitoneal gas. There is a fat containing right inguinal hernia. No bowel herniation. Musculoskeletal: No acute or destructive bony lesions. Chronic appearing L3 compression deformity. Reconstructed images demonstrate no additional findings. Review of the MIP images confirms the above findings. IMPRESSION: 1. No evidence of thoracoabdominal aortic aneurysm or dissection. 2. No evidence of pulmonary embolus. 3. High attenuation within the downstream common bile duct near the ampulla. This may reflect abnormal mucosal enhancement versus choledocholithiasis. Borderline dilation of the extrahepatic common bile duct. If further evaluation is desired, MRCP could be performed. 4. Distal colonic diverticulosis without diverticulitis. 5. Fat containing right inguinal hernia. 6. Aortic Atherosclerosis (ICD10-I70.0). Mild stenosis of the bilateral renal arteries at their origins, right greater than left. 7. Stable bibasilar parenchymal lung scarring and fibrosis. Electronically Signed   By: Randa Ngo M.D.   On: 09/11/2022 15:14   DG Chest 2 View  Result Date: 09/11/2022 CLINICAL DATA:  Chest pain EXAM: CHEST - 2 VIEW COMPARISON:  CT 01/28/2022.  X-ray 09/20/2021 and older FINDINGS: Stable cardiopericardial silhouette. No pneumothorax or effusion. Volume loss seen, underinflation with diffuse extensive interstitial changes noted bilaterally, similar to previous. Likely a chronic process. Please correlate with prior CT scan for interstitial lung disease. Air-fluid level along the stomach beneath the left hemidiaphragm. Overlapping cardiac leads  IMPRESSION: Low lung volumes with extensive interstitial changes, similar to previous. Please correlate for history of interstitial lung disease. No secondary consolidation at this time or effusion Electronically Signed   By: Jill Side M.D.   On: 09/11/2022 12:24    Microbiology: Recent Results (from the past 240 hour(s))  Resp panel by RT-PCR (RSV, Flu A&B, Covid) Nasopharyngeal Swab     Status: None   Collection Time: 09/11/22 11:51 AM   Specimen: Nasopharyngeal Swab; Nasal Swab  Result Value Ref Range Status   SARS Coronavirus 2 by RT PCR NEGATIVE NEGATIVE Final    Comment: (NOTE) SARS-CoV-2 target nucleic acids are NOT DETECTED.  The SARS-CoV-2 RNA is generally detectable in upper respiratory specimens during the acute phase of infection. The lowest concentration of SARS-CoV-2 viral copies this assay can detect is 138 copies/mL. A negative result does not preclude SARS-Cov-2 infection and should not be used as the sole basis for treatment or other patient management decisions. A negative result may occur with  improper specimen collection/handling, submission of specimen other than nasopharyngeal swab, presence of viral mutation(s) within the areas targeted by this assay, and inadequate number of viral copies(<138 copies/mL). A negative result must be combined with clinical observations, patient history, and epidemiological information. The expected result is Negative.  Fact Sheet for Patients:  EntrepreneurPulse.com.au  Fact Sheet for Healthcare Providers:  IncredibleEmployment.be  This test is no t yet approved or cleared by the [place] FDA and  has been authorized for detection and/or diagnosis of SARS-CoV-2 by FDA under an Emergency Use Authorization (EUA). This EUA will remain  in effect (meaning this test can be used) for the duration of the COVID-19 declaration under Section 564(b)(1) of the Act,  21 U.S.C.section  360bbb-3(b)(1), unless the authorization is terminated  or revoked sooner.       Influenza A by PCR NEGATIVE NEGATIVE Final   Influenza B by PCR NEGATIVE NEGATIVE Final    Comment: (NOTE) The Xpert Xpress SARS-CoV-2/FLU/RSV plus assay is intended as an aid in the diagnosis of influenza from Nasopharyngeal swab specimens and should not be used as a sole basis for treatment. Nasal washings and aspirates are unacceptable for Xpert Xpress SARS-CoV-2/FLU/RSV testing.  Fact Sheet for Patients: EntrepreneurPulse.com.au  Fact Sheet for Healthcare Providers: IncredibleEmployment.be  This test is not yet approved or cleared by the [place] FDA and has been authorized for detection and/or diagnosis of SARS-CoV-2 by FDA under an Emergency Use Authorization (EUA). This EUA will remain in effect (meaning this test can be used) for the duration of the COVID-19 declaration under Section 564(b)(1) of the Act, 21 U.S.C. section 360bbb-3(b)(1), unless the authorization is terminated or revoked.     Resp Syncytial Virus by PCR NEGATIVE NEGATIVE Final    Comment: (NOTE) Fact Sheet for Patients: EntrepreneurPulse.com.au  Fact Sheet for Healthcare Providers: IncredibleEmployment.be  This test is not yet approved or cleared by the [place] FDA and has been authorized for detection and/or diagnosis of SARS-CoV-2 by FDA under an Emergency Use Authorization (EUA). This EUA will remain in effect (meaning this test can be used) for the duration of the COVID-19 declaration under Section 564(b)(1) of the Act, 21 U.S.C. section 360bbb-3(b)(1), unless the authorization is terminated or revoked.  Performed at [facility], [address] [place], [place] [address]   Blood culture (routine x 2)     Status: None (Preliminary result)   Collection Time: 09/11/22 12:45 PM   Specimen: BLOOD RIGHT WRIST  Result  Value Ref Range Status   Specimen Description   Final    BLOOD RIGHT WRIST Performed at [facility] [address], [place], [place] [address]    Special Requests   Final    Blood Culture adequate volume BOTTLES DRAWN AEROBIC AND ANAEROBIC Performed at [facility], [address] [place], [place] [address]    Culture   Final    NO GROWTH 4 DAYS Performed at [facility], [place] [address] [place], [place] [address]    Report Status PENDING  Incomplete  MRSA Next Gen by PCR, Nasal     Status: None   Collection Time: 09/11/22  6:42 PM   Specimen: Nasal Mucosa; Nasal Swab  Result Value Ref Range Status   MRSA by PCR Next Gen NOT DETECTED NOT DETECTED Final    Comment: (NOTE) The GeneXpert MRSA Assay (FDA approved for NASAL specimens only), is one component of a comprehensive MRSA colonization surveillance program. It is not intended to diagnose MRSA infection nor to guide or monitor treatment for MRSA infections. Test performance is not FDA approved in patients less than 54 years old. Performed at [facility], [place] [address] [place], [place] [address]      Labs: Basic Metabolic Panel: Recent Labs  Lab 09/11/22 2018 09/12/22 0242 09/13/22 0253 09/14/22 0314 09/15/22 0403  NA 132* 134* 134* 133* 137  K 4.3 4.1 3.9 4.4 4.3  CL 100 106 103 104 105  CO2 22 21* '26 25 27  '$ GLUCOSE 129* 103* 83 133* 105*  BUN '20 15 16 16 19  '$ CREATININE 1.15 0.98 1.16 1.12 1.12  CALCIUM 8.2* 7.8* 8.1* 8.3* 8.2*  MG  2.1 1.9 2.0 2.0  --   PHOS 3.8 3.6  --   --   --    Liver Function Tests: Recent Labs  Lab 09/11/22 2018 09/12/22 0242 09/13/22 0253 09/14/22 0314 09/15/22 0403  AST 163* 136* 77* 41 29  ALT 154* 137* 103* 76* 56*  ALKPHOS 381* 357* 361* 335* 279*  BILITOT 4.5* 5.3* 4.7* 2.1* 1.4*  PROT 5.9* 5.5* 5.9* 5.7* 5.7*  ALBUMIN 3.1* 2.9* 3.0* 3.1* 3.1*   Recent Labs  Lab 09/11/22 1151 09/12/22 0242 09/13/22 0253  LIPASE 653* 91*  26   No results for input(s): "AMMONIA" in the last 168 hours. CBC: Recent Labs  Lab 09/11/22 1151 09/11/22 1400 09/12/22 0242 09/13/22 0253 09/14/22 0314 09/15/22 0403  WBC 16.1*  --  10.9* 11.0* 10.2 11.5*  NEUTROABS  --  10.9*  --  6.8 8.4* 7.3  HGB 15.1  --  12.8* 12.3* 12.5* 12.3*  HCT 45.2  --  39.0 37.8* 37.7* 36.8*  MCV 86.9  --  86.9 88.7 87.7 88.0  PLT 249  --  196 199 186 233   Cardiac Enzymes: No results for input(s): "CKTOTAL", "CKMB", "CKMBINDEX", "TROPONINI" in the last 168 hours. BNP: BNP (last 3 results) No results for input(s): "BNP" in the last 8760 hours.  ProBNP (last 3 results) Recent Labs    09/20/21 1121  PROBNP 792*    CBG: Recent Labs  Lab 09/14/22 1954 09/15/22 0024 09/15/22 0408 09/15/22 0751 09/15/22 1115  GLUCAP 202* 111* 91 123* 202*       Signed:  Irine Seal MD.  [facility] 09/15/2022, 11:24 AM

## 2022-09-15 NOTE — Plan of Care (Signed)
  Problem: Education: Goal: Knowledge of General Education information will improve Description: Including pain rating scale, medication(s)/side effects and non-pharmacologic comfort measures Outcome: Adequate for Discharge   Problem: Health Behavior/Discharge Planning: Goal: Ability to manage health-related needs will improve Outcome: Adequate for Discharge   Problem: Clinical Measurements: Goal: Ability to maintain clinical measurements within normal limits will improve Outcome: Adequate for Discharge Goal: Will remain free from infection Outcome: Adequate for Discharge Goal: Diagnostic test results will improve Outcome: Adequate for Discharge Goal: Respiratory complications will improve Outcome: Adequate for Discharge Goal: Cardiovascular complication will be avoided Outcome: Adequate for Discharge   Problem: Activity: Goal: Risk for activity intolerance will decrease Outcome: Adequate for Discharge   Problem: Nutrition: Goal: Adequate nutrition will be maintained Outcome: Adequate for Discharge   Problem: Coping: Goal: Level of anxiety will decrease Outcome: Adequate for Discharge   Problem: Elimination: Goal: Will not experience complications related to bowel motility Outcome: Adequate for Discharge Goal: Will not experience complications related to urinary retention Outcome: Adequate for Discharge   Problem: Pain Managment: Goal: General experience of comfort will improve Outcome: Adequate for Discharge   Problem: Safety: Goal: Ability to remain free from injury will improve Outcome: Adequate for Discharge   Problem: Skin Integrity: Goal: Risk for impaired skin integrity will decrease Outcome: Adequate for Discharge   Problem: Education: Goal: Ability to describe self-care measures that may prevent or decrease complications (Diabetes Survival Skills Education) will improve Outcome: Adequate for Discharge Goal: Individualized Educational Video(s) Outcome:  Adequate for Discharge   Problem: Coping: Goal: Ability to adjust to condition or change in health will improve Outcome: Adequate for Discharge   Problem: Fluid Volume: Goal: Ability to maintain a balanced intake and output will improve Outcome: Adequate for Discharge   Problem: Health Behavior/Discharge Planning: Goal: Ability to identify and utilize available resources and services will improve Outcome: Adequate for Discharge Goal: Ability to manage health-related needs will improve Outcome: Adequate for Discharge   Problem: Metabolic: Goal: Ability to maintain appropriate glucose levels will improve Outcome: Adequate for Discharge   Problem: Nutritional: Goal: Maintenance of adequate nutrition will improve Outcome: Adequate for Discharge Goal: Progress toward achieving an optimal weight will improve Outcome: Adequate for Discharge   Problem: Skin Integrity: Goal: Risk for impaired skin integrity will decrease Outcome: Adequate for Discharge   Problem: Tissue Perfusion: Goal: Adequacy of tissue perfusion will improve Outcome: Adequate for Discharge   Problem: Fluid Volume: Goal: Hemodynamic stability will improve Outcome: Adequate for Discharge   Problem: Clinical Measurements: Goal: Diagnostic test results will improve Outcome: Adequate for Discharge Goal: Signs and symptoms of infection will decrease Outcome: Adequate for Discharge   Problem: Respiratory: Goal: Ability to maintain adequate ventilation will improve Outcome: Adequate for Discharge

## 2022-09-15 NOTE — Evaluation (Signed)
Physical Therapy Evaluation Patient Details Name: Kerry Matthews MRN: [id number] DOB: October 27, 1935 Today's Date: 09/15/2022  History of Present Illness  Pt is an 87 y/o M admitted on 09/11/22 after presenting with c/o sudden onset epigastric chest pain radiating to his back. CT chest abdomen and pelvis done concerning for choledocholithiasis. Pt is s/p ERCP 09/13/2022. PMH: pulmonary fibrosis/ILD, alpha-1 antitrypsin deficiency, emphysema, connective tissue diease, DM, HTN, CAD, CKD, microscopic polyangiitis  Clinical Impression  Pt seen for PT evaluation with pt agreeable, wife present in room. Prior to admission pt was independent without AD, living with wife in 1 level home with 2-3 steps to enter. On this date, pt is able to complete bed mobility with mod I but requires min assist fade to supervision for gait with LUE HHA progressing to no AD. Pt demonstrates decreased balance during gait, as well as decreased gait speed. Pt also presents with decreased endurance but reports this is 2/2 not being OOB in a few days. Will continue to follow pt acutely to address gait with LRAD, balance, endurance, & stair negotiation but do not anticipate pt will require formal f/u therapy upon d/c.    Recommendations for follow up therapy are one component of a multi-disciplinary discharge planning process, led by the attending physician.  Recommendations may be updated based on patient status, additional functional criteria and insurance authorization.  Follow Up Recommendations No PT follow up      Assistance Recommended at Discharge PRN  Patient can return home with the following  Help with stairs or ramp for entrance;Assist for transportation;Assistance with cooking/housework    Equipment Recommendations None recommended by PT  Recommendations for Other Services       Functional Status Assessment Patient has had a recent decline in their functional status and demonstrates the ability to make significant  improvements in function in a reasonable and predictable amount of time.     Precautions / Restrictions Precautions Precautions: Fall Restrictions Weight Bearing Restrictions: No      Mobility  Bed Mobility Overal bed mobility: Modified Independent             General bed mobility comments: supine<>Sit with HOB elevated    Transfers Overall transfer level: Needs assistance Equipment used: None Transfers: Sit to/from Stand Sit to Stand: Modified independent (Device/Increase time)           General transfer comment: STS from EOB    Ambulation/Gait Ambulation/Gait assistance: Supervision, Min assist, Min guard Gait Distance (Feet): 100 Feet Assistive device: 1 person hand held assist, None Gait Pattern/deviations: Decreased step length - right, Decreased dorsiflexion - right, Decreased step length - left, Decreased dorsiflexion - left, Decreased stride length Gait velocity: decreased     General Gait Details: Pt initially ambulates with LUE HHA per pt request, progress to min assist<>CGA without AD, then close supervision without AD. 1 standing rest break 2/2 fatigue.  Stairs            Wheelchair Mobility    Modified Rankin (Stroke Patients Only)       Balance Overall balance assessment: Needs assistance Sitting-balance support: Feet supported Sitting balance-Leahy Scale: Good     Standing balance support: No upper extremity supported, During functional activity Standing balance-Leahy Scale: Fair                               Pertinent Vitals/Pain Pain Assessment Pain Assessment: Faces Faces Pain Scale: No hurt  Home Living Family/patient expects to be discharged to:: Private residence Living Arrangements: Spouse/significant other Available Help at Discharge: Family Type of Home: House Home Access: Stairs to enter Entrance Stairs-Rails: Left Entrance Stairs-Number of Steps: 2-3   Home Layout: One level        Prior  Function Prior Level of Function : Independent/Modified Independent;Driving             Mobility Comments: Denies falls       Hand Dominance        Extremity/Trunk Assessment   Upper Extremity Assessment Upper Extremity Assessment: Overall WFL for tasks assessed    Lower Extremity Assessment Lower Extremity Assessment: Generalized weakness    Cervical / Trunk Assessment Cervical / Trunk Assessment: Kyphotic (rounded shoulders, forward head)  Communication   Communication: No difficulties  Cognition Arousal/Alertness: Awake/alert Behavior During Therapy: WFL for tasks assessed/performed Overall Cognitive Status: Within Functional Limits for tasks assessed                                          General Comments General comments (skin integrity, edema, etc.): SpO2 95% on room air after gait, HR 77 bpm    Exercises     Assessment/Plan    PT Assessment Patient needs continued PT services  PT Problem List Decreased strength;Cardiopulmonary status limiting activity;Decreased activity tolerance;Decreased balance;Decreased mobility;Decreased knowledge of use of DME;Decreased safety awareness       PT Treatment Interventions DME instruction;Therapeutic exercise;Gait training;Balance training;Stair training;Neuromuscular re-education;Functional mobility training;Therapeutic activities;Patient/family education    PT Goals (Current goals can be found in the Care Plan section)  Acute Rehab PT Goals Patient Stated Goal: go home PT Goal Formulation: With patient/family Time For Goal Achievement: 09/29/22 Potential to Achieve Goals: Good    Frequency Min 3X/week     Co-evaluation               AM-PAC PT "6 Clicks" Mobility  Outcome Measure Help needed turning from your back to your side while in a flat bed without using bedrails?: None Help needed moving from lying on your back to sitting on the side of a flat bed without using bedrails?:  None Help needed moving to and from a bed to a chair (including a wheelchair)?: A Little Help needed standing up from a chair using your arms (e.g., wheelchair or bedside chair)?: None Help needed to walk in hospital room?: A Little Help needed climbing 3-5 steps with a railing? : A Little 6 Click Score: 21    End of Session   Activity Tolerance: Patient tolerated treatment well Patient left: in bed;with family/visitor present;with call bell/phone within reach Nurse Communication: Mobility status PT Visit Diagnosis: Muscle weakness (generalized) (M62.81);Unsteadiness on feet (R26.81)    Time: 7939-0300 PT Time Calculation (min) (ACUTE ONLY): 10 min   Charges:   PT Evaluation $PT Eval Low Complexity: [place], PT, DPT 09/15/22, 9:38 AM   Waunita Schooner 09/15/2022, 9:36 AM

## 2022-09-15 NOTE — TOC CM/SW Note (Signed)
Transition of Care [facility]) Screening Note  Patient Details  Name: Kerry Matthews Date of Birth: 1935/11/28  Transition of Care [facility]) CM/SW Contact:    Sherie Don, LCSW Phone Number: 09/15/2022, 12:20 PM  Transition of Care Department [facility]) has reviewed patient and no TOC needs have been identified at this time. We will continue to monitor patient advancement through interdisciplinary progression rounds. If new patient transition needs arise, please place a TOC consult.

## 2022-09-15 NOTE — Care Management Important Message (Signed)
Important Message  Patient Details IM Letter given. Name: Kerry Matthews MRN: [id number] Date of Birth: Aug 22, 1936   Medicare Important Message Given:  Yes     Kerin Salen 09/15/2022, 10:35 AM

## 2022-09-16 ENCOUNTER — Telehealth: Payer: Self-pay | Admitting: *Deleted

## 2022-09-16 ENCOUNTER — Encounter: Payer: Self-pay | Admitting: *Deleted

## 2022-09-16 LAB — CULTURE, BLOOD (ROUTINE X 2)
Culture: NO GROWTH
Special Requests: ADEQUATE

## 2022-09-16 NOTE — Patient Outreach (Signed)
Care Coordination [facility] Note Transition Care Management Follow-up Telephone Call Date of discharge and from where: Monday, 09/15/22 Kerry Matthews; choledocholithiasis/ SIRS without sepsis/ ERCP How have you been since you were released from the hospital? Per spouse/ caregiver [name], on [facility] DPR: "He is doing fine overall- he is out right now riding on his lawn mower passing the time; he seems back to normal really.  I handle all of his medications and they didn't change anything at the hospital, except to hold the ASA and the cholesterol medication until we go to the doctor, so we've done that.  I don't need to review his medications otherwise, I don't have any questions or concerns.  I don't really think we need anyone following up with [place] but I will keep your number in case comething comes up-- I will call you" Any questions or concerns? No  Items Reviewed: Did the pt receive and understand the discharge instructions provided? Yes  reviewed thoroughly with patient's spouse Medications obtained and verified? Yes verified changes to routine medications were initiated; confirmed no new medications after recent hospital discharge; wife/ spouse declines full medication review and confirms that she manages all of patient's medications/ fills weekly pill box, and patient then takes independently once prepared Other? No  Any new allergies since your discharge? No  Dietary orders reviewed? Yes Do you have support at home? Yes  spouse reports patient is independent at baseline in self-care activities; reports she and/ or other family assists as/ if needed  Home Care and Equipment/Supplies: Were home health services ordered? no If so, what is the name of the agency? N/A  Has the agency set up a time to come to the patient's home? not applicable Were any new equipment or medical supplies ordered?  No What is the name of the medical supply agency? N/A Were you able to get the supplies/equipment? not  applicable Do you have any questions related to the use of the equipment or supplies? No N/A  Functional Questionnaire: (I = Independent and D = Dependent) ADLs: I  Bathing/Dressing- I  Meal Prep- I  spouse prepares most meals  Eating- I  Maintaining continence- I  Transferring/Ambulation- I  Managing Meds- D  wife manages medications  Follow up appointments reviewed:  PCP Hospital f/u appt confirmed? Yes  Scheduled to see PCP office on Friday 09/19/22 @ 10:00 am Specialist Hospital f/u appt confirmed? No  Scheduled to see - on - @ - Are transportation arrangements needed? No  If their condition worsens, is the pt aware to call PCP or go to the Emergency Dept.? Yes Was the patient provided with contact information for the PCP's office or ED? No- spouse declined; reports already has contact numbers for all care providers Was to pt encouraged to call back with questions or concerns? Yes- provided my direct phone number should questions, concerns, or needs arise post-TOC call today; spouse declines offer today for ongoing care coordination outreach, states she will contact me if needs arise in the future  SDOH assessments and interventions completed:   Yes SDOH Interventions Today    Flowsheet Row Most Recent Value  SDOH Interventions   Food Insecurity Interventions Intervention Not Indicated  Transportation Interventions Intervention Not Indicated  [patient drives self,  family assists as indicated]      Care Coordination Interventions:  Provided education around prescribed diet    Encounter Outcome:  Pt. Visit Completed    Oneta Rack, RN, BSN, CCRN Alumnus RN CM Care Coordination/  Transition of Care- [place] [facility] [phone]: direct office

## 2022-09-18 NOTE — Progress Notes (Signed)
Subjective:  Patient ID: Kerry Matthews, male    DOB: [birth year]  Age: 87 y.o. MRN: [id number]  Chief Complaints: Hospital Follow up  History of Present illness:  Time since discharge: 4 days 1/18-1/22 Hospital/facility: [facility] Diagnosis: Choledocholithiasis  Procedures/tests: ERCP New medications: patient was instructed to hold Rosuvastatin 20 mg due to elevated liver enzymes and to hold Aspirin 81 mg until 09/22/22. Discharge instructions:  Patient was to follow up with PCP and recheck lab work including CBC and CMP. Status: Patient states that his stomach pain has resolved and jaundice is better, but now he is itching all over the body. He also states he's having some urinary frequency and his left ankle has been swelling and warm to the touch since 2-3 days. He has a h/o gout in the past and took colchicine. Other wise denies chest pain, distress, SHORTNESS OF BREATH and palpitation.    Current Outpatient Medications on File Prior to Visit  Medication Sig Dispense Refill   Emollient (LUBRIDERM SERIOUSLY SENSITIVE) LOTN Apply 1 Application topically as needed (itching).  0   furosemide (LASIX) 20 MG tablet TAKE 1 TABLET EVERY DAY AS NEEDED (Patient taking differently: Take 20 mg by mouth as needed for fluid or edema.) 90 tablet 1   gabapentin (NEURONTIN) 400 MG capsule TAKE 1 CAPSULE THREE TIMES DAILY (Patient taking differently: Take 400 mg by mouth 2 (two) times daily.) 270 capsule 0   glucose blood (TRUE METRIX BLOOD GLUCOSE TEST) test strip TEST BLOOD SUGAR THREE TIMES DAILY BEFORE MEALS 300 strip 3   metFORMIN (GLUCOPHAGE) 500 MG tablet Take 1 tablet by mouth once daily with breakfast (Patient taking differently: Take 250 mg by mouth daily.) 90 tablet 0   Multiple Vitamins-Minerals (PRESERVISION AREDS 2) CAPS Take 1 capsule by mouth 2 (two) times daily after a meal.     nitroGLYCERIN (NITROSTAT) 0.4 MG SL tablet Place 1 tablet (0.4 mg total) under the tongue every 5  (five) minutes as needed for chest pain. 25 tablet 9   omeprazole (PRILOSEC) 20 MG capsule TAKE 1 CAPSULE EVERY DAY 90 capsule 1   OXYGEN Inhale 1 L into the lungs at bedtime.     polyethylene glycol (MIRALAX / GLYCOLAX) 17 g packet Take 17 g by mouth daily.     riTUXimab (RITUXAN) 500 MG/50ML injection Inject into the vein every 6 (six) months.     vitamin B-12 (CYANOCOBALAMIN) 1000 MCG tablet Take 1 tablet (1,000 mcg total) by mouth daily. 30 tablet 0   [START ON 09/22/2022] aspirin EC 81 MG tablet Take 1 tablet (81 mg total) by mouth daily. Swallow whole. (Patient not taking: Reported on 09/19/2022) 30 tablet 11   Coenzyme Q10 100 MG TABS Take 100 mg by mouth at bedtime. (Patient not taking: Reported on 09/19/2022)     [START ON 09/29/2022] rosuvastatin (CRESTOR) 20 MG tablet Take 1 tablet (20 mg total) by mouth daily. (Patient not taking: Reported on 09/19/2022) 90 tablet 1   No current facility-administered medications on file prior to visit.   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  ANCA antibodies- myeloperoxidase ABS-greater than 100, ANCA proteinase 3-less than 3.5 04/02/2019- ANCA titers- p-ANCA +1: 160, C ANCA-less than 1: 20, atypical p-ANCA titer-less than 1: 20   Abnormal CT of the chest    Abnormal findings on diagnostic imaging of lung 04/12/2019   04/01/2019-CT chest with contrast- no acute process in chest abdomen or pelvis,  interstitial lung disease suspicious for early or mild UIP, pulmonary artery enlargement suggest PAH    Acute pain of left knee 11/30/2019   Alpha-1-antitrypsin deficiency carrier 05/04/2019   Atherosclerotic heart disease of native coronary artery without angina pectoris 05/03/2018   Body mass index (BMI) 29.0-29.9, adult 01/05/2020   Bradycardia    Chronic kidney disease, stage 3a ([place]) 08/12/2021   CKD (chronic kidney disease) 05/03/2018   Coronary artery disease    Coronary artery disease involving native  coronary artery of native heart without angina pectoris 05/03/2018   Diabetes mellitus type 2 in obese ([place]) 04/01/2019   Diabetic polyneuropathy associated with type 2 diabetes mellitus ([place]) 08/12/2021   Dyslipidemia associated with type 2 diabetes mellitus ([place]) 05/03/2018   Elevated rheumatoid factor 04/12/2019   04/03/2019-rheumatoid factor-95.4   Essential hypertension 05/03/2018   GAD (generalized anxiety disorder)    GERD without esophagitis 05/03/2018   Hematuria 09/07/2020   History of kidney stones    Hypertensive heart disease without heart failure 05/03/2018   Hyponatremia 08/12/2021   Idiopathic pulmonary fibrosis ([place]) 03/29/2021   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   Insomnia 05/03/2018   Interstitial pulmonary disease ([place]) 04/12/2019   04/01/2019-CT chest with contrast- no acute process in chest abdomen or pelvis, interstitial lung disease suspicious for early or mild UIP, pulmonary artery enlargement suggest PAH  04/02/2019-connective tissue work-up: Anti-Jo 1-negative Anti-DNA antibody double-stranded-negative Anti-scleroderma antibody-negative Sjogren's syndrome antibody-negative Sjogren's syndrome antibody-negative CK-31 CCP-6 E   Leukocytosis 03/08/2020   Low vitamin B12 level 04/03/2019   Lower extremity edema    Medicare annual wellness visit, subsequent 08/09/2020   Microscopic polyangiitis ([place]) 03/29/2021   Mixed diabetic hyperlipidemia associated with type 2 diabetes mellitus ([place]) 08/12/2021   Mixed hyperlipidemia 05/03/2018   Myalgia 04/19/2019   Osteoarthritis    Other emphysema ([place])    Other long term (current) drug therapy 03/29/2021   Peripheral polyneuropathy 01/30/2020   Polymyositis ([place])    Primary insomnia    Proteinuria 09/07/2020   Renal insufficiency 09/06/2020   Respiratory disorder concurrent with and due to microscopic polyangiitis ([place]) 08/12/2021   Rheumatoid factor positive 03/29/2021   Sepsis ([place]) 04/19/2019   Skin cancer    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 Colonic polyps, status post polypectomy. Mild pancolonic diverticulitits. Internal hemorrhoids.    ERCP N/A 09/13/2022   Procedure: ENDOSCOPIC RETROGRADE CHOLANGIOPANCREATOGRAPHY (ERCP);  Surgeon: Gatha Mayer, MD;  Location: [facility];  Service: Gastroenterology;  Laterality: N/A;   ESOPHAGOGASTRODUODENOSCOPY  04/26/2003   Irregular Z line suggestive of GERD. Mild gastritis status post CLO testing.    REMOVAL OF STONES  09/13/2022   Procedure: REMOVAL OF STONES;  Surgeon: Gatha Mayer, MD;  Location: [facility];  Service: Gastroenterology;;   Joan Mayans  09/13/2022   Procedure: Joan Mayans;  Surgeon: Gatha Mayer, MD;  Location: [facility];  Service: Gastroenterology;;   TOTAL KNEE ARTHROPLASTY Left 12/17/2020   Procedure: LEFT TOTAL KNEE ARTHROPLASTY;  Surgeon: Leandrew Koyanagi, MD;  Location: [facility];  Service: Orthopedics;  Laterality: Left;   TRIGGER FINGER RELEASE      Family History  Problem Relation Age of Onset   Tuberculosis Mother    Stroke Father    Pancreatic cancer Sister    Heart attack Sister    Lung disease Sister    Clotting disorder Brother    Colon cancer Neg Hx    Esophageal cancer Neg Hx    Social History   Socioeconomic History   Marital status: Married    Spouse name: Not on file   Number of children: 2   Years of education: Not on file   Highest education level: Not on file  Occupational History   Occupation: retired  Tobacco Use   Smoking status: Never   Smokeless  tobacco: Never  Vaping Use   Vaping Use: Never used  Substance and Sexual Activity   Alcohol use: Never   Drug use: Never   Sexual activity: Not on file  Other Topics Concern   Not on file  Social History Narrative   Not on file   Social Determinants of Health   Financial Resource Strain: Low Risk  (05/07/2022)   Overall Financial Resource Strain (CARDIA)    Difficulty of Paying Living Expenses: Not hard at all  Food Insecurity: No Food Insecurity (09/16/2022)   Hunger Vital Sign    Worried About Running Out of Food in the Last Year: Never true    [place] in the Last Year: Never true  Transportation Needs: No Transportation Needs (09/16/2022)   PRAPARE - Hydrologist (Medical): No    Lack of Transportation (Non-Medical): No  Physical Activity: Sufficiently Active (06/07/2020)   Exercise Vital Sign    Days of Exercise per Week: 5 days    Minutes of Exercise per Session: 30 min  Stress: Not on file  Social Connections: Not on file    Review of Systems  Constitutional:  Negative for appetite change, fatigue and fever.  HENT:  Negative for congestion, ear pain, sinus pressure and sore throat.   Respiratory:  Negative for cough, chest tightness, shortness of breath and wheezing.   Cardiovascular:  Positive for leg swelling (left ankle). Negative for chest pain and palpitations.  Gastrointestinal:  Negative for abdominal pain, constipation, diarrhea, nausea and vomiting.  Genitourinary:  Positive for frequency. Negative for dysuria and hematuria.  Musculoskeletal:  Negative for arthralgias, back pain, joint swelling and myalgias.  Skin:  Negative for rash.       Itching around abdomen, inside legs near groin area.  Neurological:  Negative for dizziness, weakness and headaches.  Psychiatric/Behavioral:  Negative for dysphoric mood. The patient is not nervous/anxious.      Objective:  BP 134/62 (BP Location: Left Arm, Patient Position:  Sitting)   Pulse 65   Temp 97.8 F (36.6 C) (Temporal)   Ht '5\' 6"'$  (1.676 m)   Wt 171 lb (77.6 kg)   SpO2 94%   BMI 27.60 kg/m      09/19/2022   10:12 AM 09/14/2022    7:57 PM 09/14/2022    5:07 PM  BP/Weight  Systolic BP 734 193 790  Diastolic BP 62 59 58  Wt. (Lbs) 171    BMI 27.6 kg/m2      Physical Exam Vitals reviewed.  Constitutional:      Appearance: Normal appearance. He is normal weight.  Cardiovascular:     Rate and Rhythm: Normal rate and regular rhythm.  Heart sounds: Normal heart sounds.  Pulmonary:     Effort: Pulmonary effort is normal.     Breath sounds: Normal breath sounds.  Abdominal:     General: Abdomen is flat. Bowel sounds are normal.     Palpations: Abdomen is soft.  Musculoskeletal:        General: Swelling (left ankle) and tenderness (Left ankle tenderness) present. No deformity or signs of injury.     Right lower leg: No edema.     Left lower leg: No edema.  Skin:    General: Skin is warm.     Capillary Refill: Capillary refill takes less than 2 seconds.     Findings: No bruising, erythema, lesion or rash.  Neurological:     Mental Status: He is alert and oriented to person, place, and time.  Psychiatric:        Mood and Affect: Mood normal.        Behavior: Behavior normal.        07/08/2022    8:31 AM 04/08/2022    9:34 AM 03/31/2022    7:43 AM 12/26/2021    8:02 AM 12/06/2021   10:40 AM  Depression screen PHQ 2/9  Decreased Interest 0 0 0 0 0  Down, Depressed, Hopeless 0 0 0 0 0  PHQ - 2 Score 0 0 0 0 0       12/06/2021   10:40 AM 12/26/2021    8:01 AM 03/31/2022    7:43 AM 04/08/2022    9:35 AM 07/08/2022    8:31 AM  Fall Risk  Falls in the past year? 0 0  1 1  Was there an injury with Fall? 0 0 '1 1 1  '$ Fall Risk Category Calculator 0 0  3 2  Fall Risk Category (Retired) Low Low  High Moderate  (RETIRED) Patient Fall Risk Level Low fall risk  Moderate fall risk Moderate fall risk Low fall risk  Patient at Risk for Falls Due  to   History of fall(s) History of fall(s);Impaired balance/gait History of fall(s)  Fall risk Follow up  Falls evaluation completed Falls evaluation completed Education provided;Falls prevention discussed;Falls evaluation completed Falls evaluation completed  Fall risk Follow up - Comments    currently doing PT and OT     Lab Results  Component Value Date   WBC 11.5 (H) 09/15/2022   HGB 12.3 (L) 09/15/2022   HCT 36.8 (L) 09/15/2022   PLT 233 09/15/2022   GLUCOSE 105 (H) 09/15/2022   CHOL 160 07/08/2022   TRIG 136 07/08/2022   HDL 42 07/08/2022   LDLCALC 94 07/08/2022   ALT 56 (H) 09/15/2022   AST 29 09/15/2022   NA 137 09/15/2022   K 4.3 09/15/2022   CL 105 09/15/2022   CREATININE 1.12 09/15/2022   BUN 19 09/15/2022   CO2 27 09/15/2022   TSH 2.028 09/11/2022   INR 1.1 09/11/2022   HGBA1C 6.1 (H) 07/08/2022   MICROALBUR 10 04/11/2021      Assessment & Plan:   Swollen L ankle Assessment & Plan: Serum uric acid lab drawn Prednisone started Described Sugar can creep up while on prednisone Can take one dose of Lasix today for swelling Advised compression shocks Advised elevating ankle Follow up as needed   Orders: -     predniSONE; Take 6 tablets (60 mg total) by mouth daily with breakfast for 1 day, THEN 5 tablets (50 mg total) daily with breakfast for 1 day, THEN 4 tablets (40  mg total) daily with breakfast for 1 day, THEN 3 tablets (30 mg total) daily with breakfast for 1 day, THEN 2 tablets (20 mg total) daily with breakfast for 1 day, THEN 1 tablet (10 mg total) daily with breakfast for 1 day.  Dispense: 21 tablet; Refill: 0 -     Uric acid  Choledocholithiasis Assessment & Plan: Hospital follow up Status post ERCP 09/13/2022 with choledocholithiasis noted status post complete removal of 2 stones with biliary sphincterotomy and balloon extraction without any complications.    Calamine lotion for itching Will repeat CBC, CMP today   Hold crestor for now until  liver enzymes come back, last lipid panel normal.   Orders: -     CBC With Diff/Platelet -     Comprehensive metabolic panel -     SM Calamine; Apply 1 Application topically as needed for itching.  Dispense: 177 mL; Refill: 0 -     Vitamin D (Ergocalciferol); Take 1 capsule (50,000 Units total) by mouth every 7 (seven) days.  Dispense: 4 capsule; Refill: 2  Elevated liver enzymes -     SM Calamine; Apply 1 Application topically as needed for itching.  Dispense: 177 mL; Refill: 0  Urinary frequency -     POCT urinalysis dipstick  Transaminitis Assessment & Plan: Will check LFT today      Follow-up: Return in about 1 month (around 10/21/2022) for CHRONIC. I, Neil Crouch have reviewed all documentation for this visit. The documentation on 09/19/22   for the exam, diagnosis, procedures, and orders are all accurate and complete.     An After Visit Summary was printed and given to the patient.   Neil Crouch, DNP, [place] [phone]

## 2022-09-19 ENCOUNTER — Encounter: Payer: Self-pay | Admitting: Nurse Practitioner

## 2022-09-19 ENCOUNTER — Ambulatory Visit (INDEPENDENT_AMBULATORY_CARE_PROVIDER_SITE_OTHER): Payer: Medicare HMO | Admitting: Nurse Practitioner

## 2022-09-19 VITALS — BP 134/62 | HR 65 | Temp 97.8°F | Ht 66.0 in | Wt 171.0 lb

## 2022-09-19 DIAGNOSIS — R748 Abnormal levels of other serum enzymes: Secondary | ICD-10-CM

## 2022-09-19 DIAGNOSIS — M25472 Effusion, left ankle: Secondary | ICD-10-CM

## 2022-09-19 DIAGNOSIS — K8050 Calculus of bile duct without cholangitis or cholecystitis without obstruction: Secondary | ICD-10-CM

## 2022-09-19 DIAGNOSIS — R350 Frequency of micturition: Secondary | ICD-10-CM

## 2022-09-19 DIAGNOSIS — R7401 Elevation of levels of liver transaminase levels: Secondary | ICD-10-CM | POA: Diagnosis not present

## 2022-09-19 HISTORY — DX: Frequency of micturition: R35.0

## 2022-09-19 HISTORY — DX: Abnormal levels of other serum enzymes: R74.8

## 2022-09-19 HISTORY — DX: Effusion, left ankle: M25.472

## 2022-09-19 LAB — POCT URINALYSIS DIPSTICK
Bilirubin, UA: NEGATIVE
Blood, UA: NEGATIVE
Glucose, UA: NEGATIVE
Ketones, UA: NEGATIVE
Leukocytes, UA: NEGATIVE
Nitrite, UA: NEGATIVE
Protein, UA: NEGATIVE
Spec Grav, UA: 1.02 (ref 1.010–1.025)
Urobilinogen, UA: NEGATIVE E.U./dL — AB
pH, UA: 6 (ref 5.0–8.0)

## 2022-09-19 MED ORDER — VITAMIN D (ERGOCALCIFEROL) 1.25 MG (50000 UNIT) PO CAPS: 50000.0000 [IU] | ORAL_CAPSULE | ORAL | 2 refills | Status: DC

## 2022-09-19 MED ORDER — PREDNISONE 10 MG PO TABS: ORAL_TABLET | ORAL | 0 refills | Status: AC

## 2022-09-19 MED ORDER — CALAMINE EX LOTN: 1.0000 | TOPICAL_LOTION | CUTANEOUS | 0 refills | Status: DC | PRN

## 2022-09-19 NOTE — Patient Instructions (Addendum)
Feb 27th for a regular check up Use OTC nystatin powder as needed Finish the prednisone  Use 1 dose of Lasix today for ankle swelling    Gout  Gout is a condition that causes painful swelling of the joints. Gout is a type of inflammation of the joints (arthritis). This condition is caused by having too much uric acid in the body. Uric acid is a chemical that forms when the body breaks down substances called purines. Purines are important for building body proteins. When the body has too much uric acid, sharp crystals can form and build up inside the joints. This causes pain and swelling. Gout attacks can happen quickly and may be very painful (acute gout). Over time, the attacks can affect more joints and become more frequent (chronic gout). Gout can also cause uric acid to build up under the skin and inside the kidneys. What are the causes? This condition is caused by too much uric acid in your blood. This can happen because: Your kidneys do not remove enough uric acid from your blood. This is the most common cause. Your body makes too much uric acid. This can happen with some cancers and cancer treatments. It can also occur if your body is breaking down too many red blood cells (hemolytic anemia). You eat too many foods that are high in purines. These foods include organ meats and some seafood. Alcohol, especially beer, is also high in purines. A gout attack may be triggered by trauma or stress. What increases the risk? The following factors may make you more likely to develop this condition: Having a family history of gout. Being male and middle-aged. Being male and having gone through menopause. Taking certain medicines, including aspirin, cyclosporine, diuretics, levodopa, and niacin. Having an organ transplant. Having certain conditions, such as: Being obese. Lead poisoning. Kidney disease. A skin condition called psoriasis. Other factors include: Losing weight too  quickly. Being dehydrated. Frequently drinking alcohol, especially beer. Frequently drinking beverages that are sweetened with a type of sugar called fructose. What are the signs or symptoms? An attack of acute gout happens quickly. It usually occurs in just one joint. The most common place is the big toe. Attacks often start at night. Other joints that may be affected include joints of the feet, ankle, knee, fingers, wrist, or elbow. Symptoms of this condition may include: Severe pain. Warmth. Swelling. Stiffness. Tenderness. The affected joint may be very painful to touch. Shiny, red, or purple skin. Chills and fever. Chronic gout may cause symptoms more frequently. More joints may be involved. You may also have white or yellow lumps (tophi) on your hands or feet or in other areas near your joints. How is this diagnosed? This condition is diagnosed based on your symptoms, your medical history, and a physical exam. You may have tests, such as: Blood tests to measure uric acid levels. Removal of joint fluid with a thin needle (aspiration) to look for uric acid crystals. X-rays to look for joint damage. How is this treated? Treatment for this condition has two phases: treating an acute attack and preventing future attacks. Acute gout treatment may include medicines to reduce pain and swelling, including: NSAIDs, such as ibuprofen. Steroids. These are strong anti-inflammatory medicines that can be taken by mouth (orally) or injected into a joint. Colchicine. This medicine relieves pain and swelling when it is taken soon after an attack. It can be given by mouth or through an IV. Preventive treatment may include: Daily use of smaller  doses of NSAIDs or colchicine. Use of a medicine that reduces uric acid levels in your blood, such as allopurinol. Changes to your diet. You may need to see a dietitian about what to eat and drink to prevent gout. Follow these instructions at home: During a  gout attack  If directed, put ice on the affected area. To do this: Put ice in a plastic bag. Place a towel between your skin and the bag. Leave the ice on for 20 minutes, 2-3 times a day. Remove the ice if your skin turns bright red. This is very important. If you cannot feel pain, heat, or cold, you have a greater risk of damage to the area. Raise (elevate) the affected joint above the level of your heart as often as possible. Rest the joint as much as possible. If the affected joint is in your leg, you may be given crutches to use. Follow instructions from your health care provider about eating or drinking restrictions. Avoiding future gout attacks Follow a low-purine diet as told by your dietitian or health care provider. Avoid foods and drinks that are high in purines, including liver, kidney, anchovies, asparagus, herring, mushrooms, mussels, and beer. Maintain a healthy weight or lose weight if you are overweight. If you want to lose weight, talk with your health care provider. Do not lose weight too quickly. Start or maintain an exercise program as told by your health care provider. Eating and drinking Avoid drinking beverages that contain fructose. Drink enough fluids to keep your urine pale yellow. If you drink alcohol: Limit how much you have to: 0-1 drink a day for women who are not pregnant. 0-2 drinks a day for men. Know how much alcohol is in a drink. In the [place] one drink equals one 12 oz bottle of beer (355 mL), one 5 oz glass of wine (148 mL), or one 1 oz glass of hard liquor (44 mL). General instructions Take over-the-counter and prescription medicines only as told by your health care provider. Ask your health care provider if the medicine prescribed to you requires you to avoid driving or using machinery. Return to your normal activities as told by your health care provider. Ask your health care provider what activities are safe for you. Keep all follow-up visits. This  is important. Where to find more information [employer] [facility] [employer]: www.niams.[employer].es Contact a health care provider if you have: Another gout attack. Continuing symptoms of a gout attack after 10 days of treatment. Side effects from your medicines. Chills or a fever. Burning pain when you urinate. Pain in your lower back or abdomen. Get help right away if you: Have severe or uncontrolled pain. Cannot urinate. Summary Gout is painful swelling of the joints caused by having too much uric acid in the body. The most common site for gout to occur is in the big toe, but it can affect other joints in the body. Medicines and dietary changes can help to prevent and treat gout attacks. This information is not intended to replace advice given to you by your health care provider. Make sure you discuss any questions you have with your health care provider. Document Revised: 05/15/2021 Document Reviewed: 05/15/2021 Elsevier Patient Education  [place].

## 2022-09-19 NOTE — Assessment & Plan Note (Addendum)
Hospital follow up Status post ERCP 09/13/2022 with choledocholithiasis noted status post complete removal of 2 stones with biliary sphincterotomy and balloon extraction without any complications.    Calamine lotion for itching Will repeat CBC, CMP today   Hold crestor for now until liver enzymes come back, last lipid panel normal.

## 2022-09-19 NOTE — Assessment & Plan Note (Signed)
Will check LFT today.  

## 2022-09-19 NOTE — Assessment & Plan Note (Signed)
Serum uric acid lab drawn Prednisone started Described Sugar can creep up while on prednisone Can take one dose of Lasix today for swelling Advised compression shocks Advised elevating ankle Follow up as needed

## 2022-09-20 LAB — CBC WITH DIFF/PLATELET
Basophils Absolute: 0.1 10*3/uL (ref 0.0–0.2)
Basos: 1 %
EOS (ABSOLUTE): 0.9 10*3/uL — ABNORMAL HIGH (ref 0.0–0.4)
Eos: 6 %
Hematocrit: 43 % (ref 37.5–51.0)
Hemoglobin: 14.1 g/dL (ref 13.0–17.7)
Immature Grans (Abs): 0.2 10*3/uL — ABNORMAL HIGH (ref 0.0–0.1)
Immature Granulocytes: 1 %
Lymphocytes Absolute: 4.7 10*3/uL — ABNORMAL HIGH (ref 0.7–3.1)
Lymphs: 31 %
MCH: 29.1 pg (ref 26.6–33.0)
MCHC: 32.8 g/dL (ref 31.5–35.7)
MCV: 89 fL (ref 79–97)
Monocytes Absolute: 1.5 10*3/uL — ABNORMAL HIGH (ref 0.1–0.9)
Monocytes: 10 %
Neutrophils Absolute: 7.8 10*3/uL — ABNORMAL HIGH (ref 1.4–7.0)
Neutrophils: 51 %
Platelets: 403 10*3/uL (ref 150–450)
RBC: 4.85 x10E6/uL (ref 4.14–5.80)
RDW: 13.1 % (ref 11.6–15.4)
WBC: 15.2 10*3/uL — ABNORMAL HIGH (ref 3.4–10.8)

## 2022-09-20 LAB — COMPREHENSIVE METABOLIC PANEL
ALT: 33 IU/L (ref 0–44)
AST: 30 IU/L (ref 0–40)
Albumin/Globulin Ratio: 1.5 (ref 1.2–2.2)
Albumin: 4 g/dL (ref 3.7–4.7)
Alkaline Phosphatase: 282 IU/L — ABNORMAL HIGH (ref 44–121)
BUN/Creatinine Ratio: 12 (ref 10–24)
BUN: 15 mg/dL (ref 8–27)
Bilirubin Total: 0.9 mg/dL (ref 0.0–1.2)
CO2: 24 mmol/L (ref 20–29)
Calcium: 9.1 mg/dL (ref 8.6–10.2)
Chloride: 103 mmol/L (ref 96–106)
Creatinine, Ser: 1.21 mg/dL (ref 0.76–1.27)
Globulin, Total: 2.6 g/dL (ref 1.5–4.5)
Glucose: 101 mg/dL — ABNORMAL HIGH (ref 70–99)
Potassium: 4.7 mmol/L (ref 3.5–5.2)
Sodium: 141 mmol/L (ref 134–144)
Total Protein: 6.6 g/dL (ref 6.0–8.5)
eGFR: 59 mL/min/{1.73_m2} — ABNORMAL LOW (ref >59)

## 2022-09-20 LAB — URIC ACID: Uric Acid: 5.5 mg/dL (ref 3.8–8.4)

## 2022-09-22 ENCOUNTER — Ambulatory Visit (INDEPENDENT_AMBULATORY_CARE_PROVIDER_SITE_OTHER): Payer: Medicare HMO | Admitting: Family Medicine

## 2022-09-22 ENCOUNTER — Encounter: Payer: Self-pay | Admitting: Family Medicine

## 2022-09-22 VITALS — BP 106/70 | HR 78 | Temp 96.6°F | Resp 18 | Ht 66.0 in | Wt 172.0 lb

## 2022-09-22 DIAGNOSIS — N1831 Chronic kidney disease, stage 3a: Secondary | ICD-10-CM

## 2022-09-22 DIAGNOSIS — J84112 Idiopathic pulmonary fibrosis: Secondary | ICD-10-CM | POA: Diagnosis not present

## 2022-09-22 DIAGNOSIS — J189 Pneumonia, unspecified organism: Secondary | ICD-10-CM

## 2022-09-22 DIAGNOSIS — J8410 Pulmonary fibrosis, unspecified: Secondary | ICD-10-CM | POA: Diagnosis not present

## 2022-09-22 DIAGNOSIS — E1142 Type 2 diabetes mellitus with diabetic polyneuropathy: Secondary | ICD-10-CM

## 2022-09-22 DIAGNOSIS — J9611 Chronic respiratory failure with hypoxia: Secondary | ICD-10-CM

## 2022-09-22 DIAGNOSIS — D72829 Elevated white blood cell count, unspecified: Secondary | ICD-10-CM

## 2022-09-22 LAB — POCT URINALYSIS DIPSTICK
Bilirubin, UA: NEGATIVE
Blood, UA: NEGATIVE
Glucose, UA: NEGATIVE
Ketones, UA: NEGATIVE
Leukocytes, UA: NEGATIVE
Nitrite, UA: NEGATIVE
Protein, UA: POSITIVE — AB
Spec Grav, UA: 1.025 (ref 1.010–1.025)
Urobilinogen, UA: NEGATIVE E.U./dL — AB
pH, UA: 5.5 (ref 5.0–8.0)

## 2022-09-22 MED ORDER — AMOXICILLIN-POT CLAVULANATE 875-125 MG PO TABS: 1.0000 | ORAL_TABLET | Freq: Two times a day (BID) | ORAL | 0 refills | Status: DC

## 2022-09-22 MED ORDER — CEFTRIAXONE SODIUM 1 G IJ SOLR
1.0000 g | Freq: Once | INTRAMUSCULAR | Status: AC
  Administered 2022-09-22: 1 g via INTRAMUSCULAR

## 2022-09-22 NOTE — Patient Instructions (Signed)
Concerning for pneumonia..  Ordering chest x-ray. Rocephin 1 g IM given Augmentin 875 mg twice daily x 10 days. Check CBC and chemistry panel

## 2022-09-22 NOTE — Progress Notes (Signed)
Acute Office Visit  Subjective:    Patient ID: Kerry Matthews, male    DOB: February 04, 1936, 87 y.o.   MRN: [id number]  Chief Complaint  Patient presents with   elevated WBC    HPI: Patient is in today for hospital follow-up 09/11/2022-09/15/2022.  Patient was admitted for choledocholithiasis, acute gallstone pancreatitis, SIRS, CKD stage 3a, diabetic neuropathy, CORONARY ARTERY DISEASE, and Hyperlipidemia.  Presenting symptom was epigastric pain radiating to back, low fever, chills, and nausea. Concern for sepsis warranted rocephin IV.  Patient noted to have a transaminitis, lipase levels elevated at 653  but significant decrease to 26 today. Belief was he passed a gallstone. -LFTs trended down with clinical improvement. -CT angiogram chest/abdomen and pelvis was done on admission, with concerns for choledocholithiasis, distal colonic diverticulosis without diverticulitis, fat-containing right inguinal hernia, no evidence of pulmonary embolus, no evidence of thoracoabdominal aortic aneurysm or dissection. GI consulted, right upper quadrant ultrasound obtained with no evidence of biliary obstruction. Status post ERCP 09/13/2022 with choledocholithiasis noted status post complete removal of 2 stones with biliary sphincterotomy and balloon extraction without any complications.   Blood cultures neg x 4. UA normal.  Zosyn continued until the day after his ERCP. No further antibiotics recommended by GI. Patient is feeling better, but still having cough and worsening shortness of breath.    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  ANCA antibodies- myeloperoxidase ABS-greater than 100, ANCA proteinase 3-less than 3.5 04/02/2019- ANCA titers- p-ANCA +1: 160, C ANCA-less than 1: 20, atypical p-ANCA titer-less than 1: 20   Abnormal CT of the chest    Abnormal findings on diagnostic imaging of lung 04/12/2019   04/01/2019-CT chest with contrast- no acute  process in chest abdomen or pelvis, interstitial lung disease suspicious for early or mild UIP, pulmonary artery enlargement suggest PAH    Acute pain of left knee 11/30/2019   Alpha-1-antitrypsin deficiency carrier 05/04/2019   Atherosclerotic heart disease of native coronary artery without angina pectoris 05/03/2018   Body mass index (BMI) 29.0-29.9, adult 01/05/2020   Bradycardia    Chronic kidney disease, stage 3a ([place]) 08/12/2021   CKD (chronic kidney disease) 05/03/2018   Coronary artery disease    Coronary artery disease involving native coronary artery of native heart without angina pectoris 05/03/2018   Diabetes mellitus type 2 in obese ([place]) 04/01/2019   Diabetic polyneuropathy associated with type 2 diabetes mellitus ([place]) 08/12/2021   Dyslipidemia associated with type 2 diabetes mellitus ([place]) 05/03/2018   Elevated rheumatoid factor 04/12/2019   04/03/2019-rheumatoid factor-95.4   Essential hypertension 05/03/2018   GAD (generalized anxiety disorder)    GERD without esophagitis 05/03/2018   Hematuria 09/07/2020   History of kidney stones    Hypertensive heart disease without heart failure 05/03/2018   Hyponatremia 08/12/2021   Idiopathic pulmonary fibrosis ([place]) 03/29/2021   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   Insomnia 05/03/2018   Interstitial pulmonary disease ([place]) 04/12/2019   04/01/2019-CT chest with contrast- no acute process in chest abdomen or pelvis, interstitial lung disease suspicious for early or mild UIP, pulmonary artery enlargement suggest PAH  04/02/2019-connective tissue work-up: Anti-Jo 1-negative Anti-DNA antibody double-stranded-negative Anti-scleroderma antibody-negative Sjogren's syndrome antibody-negative Sjogren's syndrome antibody-negative CK-31 CCP-6 E   Leukocytosis 03/08/2020   Low vitamin B12 level 04/03/2019   Lower extremity edema    Medicare annual wellness visit, subsequent 08/09/2020   Microscopic polyangiitis ([place]) 03/29/2021   Mixed diabetic  hyperlipidemia associated with type 2 diabetes  mellitus ([place]) 08/12/2021   Mixed hyperlipidemia 05/03/2018   Myalgia 04/19/2019   Osteoarthritis    Other emphysema ([place])    Other long term (current) drug therapy 03/29/2021   Peripheral polyneuropathy 01/30/2020   Polymyositis ([place])    Primary insomnia    Proteinuria 09/07/2020   Renal insufficiency 09/06/2020   Respiratory disorder concurrent with and due to microscopic polyangiitis ([place]) 08/12/2021   Rheumatoid factor positive 03/29/2021   Sepsis ([place]) 04/19/2019   Skin cancer    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/17/2020   Transaminitis    Trigger finger 05/03/2018   Type 2 diabetes mellitus with hyperglycemia, with long-term current use of insulin ([place]) 04/23/2021   UC (ulcerative colitis) ([place])    UTI (urinary tract infection)    Vasculitis ([place]) 03/29/2021    Past Surgical History:  Procedure Laterality Date   ANGIOPLASTY  2010   BACK SURGERY     CATARACT EXTRACTION     COLONOSCOPY  06/16/2005   Mild colitis involving splenic flexure. Colonic polyps, status post polypectomy. Mild pancolonic diverticulitits. Internal hemorrhoids.    ERCP N/A 09/13/2022   Procedure: ENDOSCOPIC RETROGRADE CHOLANGIOPANCREATOGRAPHY (ERCP);  Surgeon: Gatha Mayer, MD;  Location: [facility];  Service: Gastroenterology;  Laterality: N/A;   ESOPHAGOGASTRODUODENOSCOPY  04/26/2003   Irregular Z line suggestive of GERD. Mild gastritis status post CLO testing.    REMOVAL OF STONES  09/13/2022   Procedure: REMOVAL OF STONES;  Surgeon: Gatha Mayer, MD;  Location: [facility];  Service: Gastroenterology;;   Joan Mayans  09/13/2022   Procedure: Joan Mayans;  Surgeon: Gatha Mayer, MD;  Location: [facility];  Service: Gastroenterology;;   TOTAL KNEE ARTHROPLASTY Left 12/17/2020   Procedure: LEFT TOTAL KNEE ARTHROPLASTY;  Surgeon: Leandrew Koyanagi, MD;  Location: [facility];  Service: Orthopedics;  Laterality: Left;   TRIGGER FINGER  RELEASE      Family History  Problem Relation Age of Onset   Tuberculosis Mother    Stroke Father    Pancreatic cancer Sister    Heart attack Sister    Lung disease Sister    Clotting disorder Brother    Colon cancer Neg Hx    Esophageal cancer Neg Hx     Social History   Socioeconomic History   Marital status: Married    Spouse name: Not on file   Number of children: 2   Years of education: Not on file   Highest education level: Not on file  Occupational History   Occupation: retired  Tobacco Use   Smoking status: Never   Smokeless tobacco: Never  Vaping Use   Vaping Use: Never used  Substance and Sexual Activity   Alcohol use: Never   Drug use: Never   Sexual activity: Not on file  Other Topics Concern   Not on file  Social History Narrative   Not on file   Social Determinants of Health   Financial Resource Strain: Low Risk  (05/07/2022)   Overall Financial Resource Strain (CARDIA)    Difficulty of Paying Living Expenses: Not hard at all  Food Insecurity: No Food Insecurity (09/16/2022)   Hunger Vital Sign    Worried About Running Out of Food in the Last Year: Never true    [place] in the Last Year: Never true  Transportation Needs: No Transportation Needs (09/16/2022)   PRAPARE - Hydrologist (Medical): No    Lack of Transportation (Non-Medical): No  Physical Activity: Sufficiently Active (  06/07/2020)   Exercise Vital Sign    Days of Exercise per Week: 5 days    Minutes of Exercise per Session: 30 min  Stress: Not on file  Social Connections: Not on file  Intimate Partner Violence: Not At Risk (04/08/2022)   Humiliation, Afraid, Rape, and Kick questionnaire    Fear of Current or Ex-Partner: No    Emotionally Abused: No    Physically Abused: No    Sexually Abused: No    Outpatient Medications Prior to Visit  Medication Sig Dispense Refill   aspirin EC 81 MG tablet Take 1 tablet (81 mg total) by mouth daily. Swallow  whole. 30 tablet 11   calamine lotion Apply 1 Application topically as needed for itching. 177 mL 0   Emollient (LUBRIDERM SERIOUSLY SENSITIVE) LOTN Apply 1 Application topically as needed (itching).  0   furosemide (LASIX) 20 MG tablet TAKE 1 TABLET EVERY DAY AS NEEDED (Patient taking differently: Take 20 mg by mouth as needed for fluid or edema.) 90 tablet 1   glucose blood (TRUE METRIX BLOOD GLUCOSE TEST) test strip TEST BLOOD SUGAR THREE TIMES DAILY BEFORE MEALS 300 strip 3   Multiple Vitamins-Minerals (PRESERVISION AREDS 2) CAPS Take 1 capsule by mouth 2 (two) times daily after a meal.     nitroGLYCERIN (NITROSTAT) 0.4 MG SL tablet Place 1 tablet (0.4 mg total) under the tongue every 5 (five) minutes as needed for chest pain. 25 tablet 9   omeprazole (PRILOSEC) 20 MG capsule TAKE 1 CAPSULE EVERY DAY 90 capsule 1   polyethylene glycol (MIRALAX / GLYCOLAX) 17 g packet Take 17 g by mouth daily.     riTUXimab (RITUXAN) 500 MG/50ML injection Inject into the vein every 6 (six) months.     vitamin B-12 (CYANOCOBALAMIN) 1000 MCG tablet Take 1 tablet (1,000 mcg total) by mouth daily. 30 tablet 0   Vitamin D, Ergocalciferol, (DRISDOL) 1.25 MG (50000 UNIT) CAPS capsule Take 1 capsule (50,000 Units total) by mouth every 7 (seven) days. 4 capsule 2   gabapentin (NEURONTIN) 400 MG capsule TAKE 1 CAPSULE THREE TIMES DAILY (Patient taking differently: Take 400 mg by mouth 2 (two) times daily.) 270 capsule 0   metFORMIN (GLUCOPHAGE) 500 MG tablet Take 1 tablet by mouth once daily with breakfast (Patient taking differently: Take 250 mg by mouth daily.) 90 tablet 0   OXYGEN Inhale 1 L into the lungs at bedtime. (Patient not taking: Reported on 09/22/2022)     predniSONE (DELTASONE) 10 MG tablet Take 6 tablets (60 mg total) by mouth daily with breakfast for 1 day, THEN 5 tablets (50 mg total) daily with breakfast for 1 day, THEN 4 tablets (40 mg total) daily with breakfast for 1 day, THEN 3 tablets (30 mg total)  daily with breakfast for 1 day, THEN 2 tablets (20 mg total) daily with breakfast for 1 day, THEN 1 tablet (10 mg total) daily with breakfast for 1 day. (Patient not taking: Reported on 09/22/2022) 21 tablet 0   rosuvastatin (CRESTOR) 20 MG tablet Take 1 tablet (20 mg total) by mouth daily. (Patient not taking: Reported on 09/19/2022) 90 tablet 1   Coenzyme Q10 100 MG TABS Take 100 mg by mouth at bedtime. (Patient not taking: Reported on 09/19/2022)     No facility-administered medications prior to visit.    Allergies  Allergen Reactions   Ace Inhibitors Other (See Comments)    = Slow heart rate   Hydrocodone Nausea And Vomiting   Hydrocodone-Acetaminophen Nausea And  Vomiting   Nsaids Other (See Comments)    Kidney issues   Sulfamethoxazole Nausea And Vomiting   Sulfamethoxazole-Trimethoprim Nausea And Vomiting   Trimethoprim Other (See Comments)    Reaction not recalled    Review of Systems  Constitutional:  Negative for chills and fever.  HENT:  Negative for congestion, rhinorrhea and sore throat.   Respiratory:  Positive for cough and shortness of breath.   Cardiovascular:  Negative for chest pain and palpitations.  Gastrointestinal:  Negative for abdominal pain, constipation, diarrhea, nausea and vomiting.  Genitourinary:  Negative for dysuria and urgency.  Musculoskeletal:  Negative for arthralgias, back pain and myalgias.  Neurological:  Negative for dizziness and headaches.  Psychiatric/Behavioral:  Negative for dysphoric mood. The patient is not nervous/anxious.        Objective:        09/22/2022    3:42 PM 09/19/2022   10:12 AM 09/14/2022    7:57 PM  Vitals with BMI  Height 5' 6"$  5' 6"$    Weight 172 lbs 171 lbs   BMI 99991111 [id number]   Systolic [id number]  Diastolic 70 62 59  Pulse 78 65 57    No data found.   Physical Exam Vitals reviewed.  Constitutional:      Appearance: Normal appearance.  HENT:     Nose: No congestion or rhinorrhea.     Mouth/Throat:      Pharynx: No oropharyngeal exudate or posterior oropharyngeal erythema.  Cardiovascular:     Rate and Rhythm: Normal rate and regular rhythm.     Heart sounds: Normal heart sounds.  Pulmonary:     Effort: Pulmonary effort is normal.     Breath sounds: Rhonchi (LLL) present. No wheezing or rales.  Abdominal:     General: Bowel sounds are normal.     Palpations: Abdomen is soft.     Tenderness: There is no abdominal tenderness.  Neurological:     Mental Status: He is alert and oriented to person, place, and time.  Psychiatric:        Mood and Affect: Mood normal.        Behavior: Behavior normal.     Health Maintenance Due  Topic Date Due   DTaP/Tdap/Td (1 - Tdap) Never done   Zoster Vaccines- Shingrix (1 of 2) Never done   COVID-19 Vaccine (5 - 2023-24 season) 07/24/2022    There are no preventive care reminders to display for this patient.   Lab Results  Component Value Date   TSH 2.028 09/11/2022   Lab Results  Component Value Date   WBC 10.7 09/22/2022   HGB 13.6 09/22/2022   HCT 40.1 09/22/2022   MCV 88 09/22/2022   PLT 369 09/22/2022   Lab Results  Component Value Date   NA 140 09/22/2022   K 4.9 09/22/2022   CO2 22 09/22/2022   GLUCOSE 112 (H) 09/22/2022   BUN 14 09/22/2022   CREATININE 1.19 09/22/2022   BILITOT 0.6 09/22/2022   ALKPHOS 236 (H) 09/22/2022   AST 19 09/22/2022   ALT 21 09/22/2022   PROT 6.1 09/22/2022   ALBUMIN 3.9 09/22/2022   CALCIUM 8.9 09/22/2022   ANIONGAP 5 09/15/2022   EGFR 60 09/22/2022   GFR 40.27 (L) 02/19/2022   Lab Results  Component Value Date   CHOL 160 07/08/2022   Lab Results  Component Value Date   HDL 42 07/08/2022   Lab Results  Component Value Date   LDLCALC 94 07/08/2022  Lab Results  Component Value Date   TRIG 136 07/08/2022   Lab Results  Component Value Date   CHOLHDL 3.8 07/08/2022   Lab Results  Component Value Date   HGBA1C 6.1 (H) 07/08/2022       Assessment & Plan:  Acute  gallstone pancreatitis Resolved with ERCP and removal of 2 gallstones.   Chronic kidney disease, stage 3a (HCC) Stable. Check cmp   Chronic respiratory failure with hypoxia (HCC) Continue oxygen 2 L daily.  Diabetic polyneuropathy associated with type 2 diabetes mellitus (HCC) Stable.  Recommend continue to work on eating healthy diet and exercise.   Idiopathic pulmonary fibrosis (HCC) Continue oxygen  Leukocytosis Check cbc   Pneumonia of left lower lobe due to infectious organism Rocephin given.  Prescription: augmentin    Meds ordered this encounter  Medications   cefTRIAXone (ROCEPHIN) injection 1 g   amoxicillin-clavulanate (AUGMENTIN) 875-125 MG tablet    Sig: Take 1 tablet by mouth 2 (two) times daily.    Dispense:  20 tablet    Refill:  0   gabapentin (NEURONTIN) 400 MG capsule    Sig: Take 1 capsule (400 mg total) by mouth 2 (two) times daily.    Dispense:  1 capsule    Refill:  0   metFORMIN (GLUCOPHAGE) 500 MG tablet    Sig: Take 0.5 tablets (250 mg total) by mouth daily.    Dispense:  1 tablet    Refill:  0    Orders Placed This Encounter  Procedures   DG Chest 2 View   CBC with Differential/Platelet   Comprehensive metabolic panel   POCT urinalysis dipstick     Follow-up: Return if symptoms worsen or fail to improve.  An After Visit Summary was printed and given to the patient.  [name], MD [facility] [phone]

## 2022-09-23 LAB — CBC WITH DIFFERENTIAL/PLATELET
Basophils Absolute: 0.1 10*3/uL (ref 0.0–0.2)
Basos: 1 %
EOS (ABSOLUTE): 0.9 10*3/uL — ABNORMAL HIGH (ref 0.0–0.4)
Eos: 9 %
Hematocrit: 40.1 % (ref 37.5–51.0)
Hemoglobin: 13.6 g/dL (ref 13.0–17.7)
Immature Grans (Abs): 0.1 10*3/uL (ref 0.0–0.1)
Immature Granulocytes: 1 %
Lymphocytes Absolute: 3.3 10*3/uL — ABNORMAL HIGH (ref 0.7–3.1)
Lymphs: 31 %
MCH: 29.7 pg (ref 26.6–33.0)
MCHC: 33.9 g/dL (ref 31.5–35.7)
MCV: 88 fL (ref 79–97)
Monocytes Absolute: 0.9 10*3/uL (ref 0.1–0.9)
Monocytes: 9 %
Neutrophils Absolute: 5.4 10*3/uL (ref 1.4–7.0)
Neutrophils: 49 %
Platelets: 369 10*3/uL (ref 150–450)
RBC: 4.58 x10E6/uL (ref 4.14–5.80)
RDW: 13 % (ref 11.6–15.4)
WBC: 10.7 10*3/uL (ref 3.4–10.8)

## 2022-09-23 LAB — COMPREHENSIVE METABOLIC PANEL
ALT: 21 IU/L (ref 0–44)
AST: 19 IU/L (ref 0–40)
Albumin/Globulin Ratio: 1.8 (ref 1.2–2.2)
Albumin: 3.9 g/dL (ref 3.7–4.7)
Alkaline Phosphatase: 236 IU/L — ABNORMAL HIGH (ref 44–121)
BUN/Creatinine Ratio: 12 (ref 10–24)
BUN: 14 mg/dL (ref 8–27)
Bilirubin Total: 0.6 mg/dL (ref 0.0–1.2)
CO2: 22 mmol/L (ref 20–29)
Calcium: 8.9 mg/dL (ref 8.6–10.2)
Chloride: 102 mmol/L (ref 96–106)
Creatinine, Ser: 1.19 mg/dL (ref 0.76–1.27)
Globulin, Total: 2.2 g/dL (ref 1.5–4.5)
Glucose: 112 mg/dL — ABNORMAL HIGH (ref 70–99)
Potassium: 4.9 mmol/L (ref 3.5–5.2)
Sodium: 140 mmol/L (ref 134–144)
Total Protein: 6.1 g/dL (ref 6.0–8.5)
eGFR: 60 mL/min/{1.73_m2} (ref >59)

## 2022-09-23 NOTE — Progress Notes (Signed)
Blood count normal. Wbc back to normal. Liver function normal.  Kidney function normal.  CXR: pulmonary fibrosis. No pneumonia noted. I would still take antibiotic.

## 2022-09-24 DIAGNOSIS — E663 Overweight: Secondary | ICD-10-CM | POA: Diagnosis not present

## 2022-09-24 DIAGNOSIS — M317 Microscopic polyangiitis: Secondary | ICD-10-CM | POA: Diagnosis not present

## 2022-09-24 DIAGNOSIS — Z6825 Body mass index (BMI) 25.0-25.9, adult: Secondary | ICD-10-CM | POA: Diagnosis not present

## 2022-09-24 DIAGNOSIS — J8410 Pulmonary fibrosis, unspecified: Secondary | ICD-10-CM | POA: Diagnosis not present

## 2022-09-24 DIAGNOSIS — R768 Other specified abnormal immunological findings in serum: Secondary | ICD-10-CM | POA: Diagnosis not present

## 2022-09-24 DIAGNOSIS — Z79899 Other long term (current) drug therapy: Secondary | ICD-10-CM | POA: Diagnosis not present

## 2022-09-24 DIAGNOSIS — J84112 Idiopathic pulmonary fibrosis: Secondary | ICD-10-CM | POA: Diagnosis not present

## 2022-09-24 DIAGNOSIS — R5383 Other fatigue: Secondary | ICD-10-CM | POA: Diagnosis not present

## 2022-09-30 DIAGNOSIS — R0683 Snoring: Secondary | ICD-10-CM | POA: Diagnosis not present

## 2022-09-30 DIAGNOSIS — G4730 Sleep apnea, unspecified: Secondary | ICD-10-CM | POA: Diagnosis not present

## 2022-10-03 ENCOUNTER — Telehealth: Payer: Self-pay | Admitting: Pulmonary Disease

## 2022-10-03 DIAGNOSIS — J849 Interstitial pulmonary disease, unspecified: Secondary | ICD-10-CM

## 2022-10-03 NOTE — Telephone Encounter (Signed)
ONO results from 09/30/22 received from [facility].  Results given to Dr. Vaughan Browner to review.  Message routed to Dr. Vaughan Browner to advise

## 2022-10-05 DIAGNOSIS — J189 Pneumonia, unspecified organism: Secondary | ICD-10-CM | POA: Insufficient documentation

## 2022-10-05 DIAGNOSIS — D72829 Elevated white blood cell count, unspecified: Secondary | ICD-10-CM | POA: Insufficient documentation

## 2022-10-05 HISTORY — DX: Pneumonia, unspecified organism: J18.9

## 2022-10-05 MED ORDER — METFORMIN HCL 500 MG PO TABS: 250.0000 mg | ORAL_TABLET | Freq: Every day | ORAL | 0 refills | Status: DC

## 2022-10-05 MED ORDER — GABAPENTIN 400 MG PO CAPS: 400.0000 mg | ORAL_CAPSULE | Freq: Two times a day (BID) | ORAL | 0 refills | Status: DC

## 2022-10-05 NOTE — Assessment & Plan Note (Signed)
Rocephin given.  Prescription: augmentin

## 2022-10-05 NOTE — Assessment & Plan Note (Signed)
Stable.  Recommend continue to work on eating healthy diet and exercise.  

## 2022-10-05 NOTE — Assessment & Plan Note (Signed)
Continue oxygen. 

## 2022-10-05 NOTE — Assessment & Plan Note (Signed)
Stable. Check cmp.  ?

## 2022-10-05 NOTE — Assessment & Plan Note (Signed)
Continue oxygen 2 L daily.

## 2022-10-05 NOTE — Assessment & Plan Note (Signed)
Resolved with ERCP and removal of 2 gallstones.

## 2022-10-05 NOTE — Assessment & Plan Note (Signed)
Check cbc 

## 2022-10-06 DIAGNOSIS — M65311 Trigger thumb, right thumb: Secondary | ICD-10-CM | POA: Diagnosis not present

## 2022-10-06 DIAGNOSIS — M1811 Unilateral primary osteoarthritis of first carpometacarpal joint, right hand: Secondary | ICD-10-CM | POA: Diagnosis not present

## 2022-10-07 ENCOUNTER — Telehealth: Payer: Self-pay | Admitting: Pulmonary Disease

## 2022-10-08 NOTE — Telephone Encounter (Signed)
Dr. Vaughan Browner can you please advise on ONO results?

## 2022-10-09 NOTE — Telephone Encounter (Signed)
See encounter 10/03/22. Closing this encounter.

## 2022-10-13 NOTE — Telephone Encounter (Signed)
Overnight oximetry on room air dated 09/30/2022 Time of study 6 hours 25 minutes Nadir O2 sat of 77%.   Time spent less than 88% - 37 minutes, 44 seconds Oxygen desaturation index 35.7  Please let the patient know that his oxygenation levels were better this time but they are still low enough that we will need to continue oxygen at night for now.

## 2022-10-13 NOTE — Telephone Encounter (Signed)
Called and spoke with patient.  Dr. Matilde Bash  results and recommendations given. Understanding stated. Patient stated he has not been using his O2 at night in several weeks and last hospital admission O2 was left off.  Advised and encouraged patient to continue O2 at night, but patient is requesting to have O2 discontinued and DME order for O2 pick up.  Message routed to Dr. Vaughan Browner to advise

## 2022-10-15 ENCOUNTER — Encounter: Payer: Self-pay | Admitting: Podiatry

## 2022-10-15 ENCOUNTER — Ambulatory Visit: Payer: Medicare HMO | Admitting: Podiatry

## 2022-10-15 DIAGNOSIS — M79675 Pain in left toe(s): Secondary | ICD-10-CM

## 2022-10-15 DIAGNOSIS — Z794 Long term (current) use of insulin: Secondary | ICD-10-CM

## 2022-10-15 DIAGNOSIS — M79674 Pain in right toe(s): Secondary | ICD-10-CM | POA: Diagnosis not present

## 2022-10-15 DIAGNOSIS — B351 Tinea unguium: Secondary | ICD-10-CM | POA: Diagnosis not present

## 2022-10-15 DIAGNOSIS — G629 Polyneuropathy, unspecified: Secondary | ICD-10-CM

## 2022-10-15 DIAGNOSIS — E1165 Type 2 diabetes mellitus with hyperglycemia: Secondary | ICD-10-CM | POA: Diagnosis not present

## 2022-10-15 NOTE — Telephone Encounter (Signed)
Okay to discontinue oxygen per patient preference as it looks like he does not want to keep using it.

## 2022-10-15 NOTE — Progress Notes (Signed)
Subjective:  Patient ID: Kerry Matthews, male    DOB: [birth year],   MRN: [id number]  Chief Complaint  Patient presents with   Nail Problem     Routine foot care    87 y.o. male presents for concern of thickened elongated and painful nails that are difficult to trim. Requesting to have them trimmed today. Denies any burning or tingling in her feet. Patient is diabetic and last A1c was 6.3.   . Denies any other pedal complaints. Denies n/v/f/c.   PCP: [name] MD   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  ANCA antibodies- myeloperoxidase ABS-greater than 100, ANCA proteinase 3-less than 3.5 04/02/2019- ANCA titers- p-ANCA +1: 160, C ANCA-less than 1: 20, atypical p-ANCA titer-less than 1: 20   Abnormal CT of the chest    Abnormal findings on diagnostic imaging of lung 04/12/2019   04/01/2019-CT chest with contrast- no acute process in chest abdomen or pelvis, interstitial lung disease suspicious for early or mild UIP, pulmonary artery enlargement suggest PAH    Acute pain of left knee 11/30/2019   Alpha-1-antitrypsin deficiency carrier 05/04/2019   Atherosclerotic heart disease of native coronary artery without angina pectoris 05/03/2018   Body mass index (BMI) 29.0-29.9, adult 01/05/2020   Bradycardia    Chronic kidney disease, stage 3a ([place]) 08/12/2021   CKD (chronic kidney disease) 05/03/2018   Coronary artery disease    Coronary artery disease involving native coronary artery of native heart without angina pectoris 05/03/2018   Diabetes mellitus type 2 in obese ([place]) 04/01/2019   Diabetic polyneuropathy associated with type 2 diabetes mellitus ([place]) 08/12/2021   Dyslipidemia associated with type 2 diabetes mellitus ([place]) 05/03/2018   Elevated rheumatoid factor 04/12/2019   04/03/2019-rheumatoid factor-95.4   Essential hypertension 05/03/2018   GAD (generalized anxiety disorder)    GERD without esophagitis 05/03/2018   Hematuria  09/07/2020   History of kidney stones    Hypertensive heart disease without heart failure 05/03/2018   Hyponatremia 08/12/2021   Idiopathic pulmonary fibrosis ([place]) 03/29/2021   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   Insomnia 05/03/2018   Interstitial pulmonary disease ([place]) 04/12/2019   04/01/2019-CT chest with contrast- no acute process in chest abdomen or pelvis, interstitial lung disease suspicious for early or mild UIP, pulmonary artery enlargement suggest PAH  04/02/2019-connective tissue work-up: Anti-Jo 1-negative Anti-DNA antibody double-stranded-negative Anti-scleroderma antibody-negative Sjogren's syndrome antibody-negative Sjogren's syndrome antibody-negative CK-31 CCP-6 E   Leukocytosis 03/08/2020   Low vitamin B12 level 04/03/2019   Lower extremity edema    Medicare annual wellness visit, subsequent 08/09/2020   Microscopic polyangiitis ([place]) 03/29/2021   Mixed diabetic hyperlipidemia associated with type 2 diabetes mellitus ([place]) 08/12/2021   Mixed hyperlipidemia 05/03/2018   Myalgia 04/19/2019   Osteoarthritis    Other emphysema ([place])    Other long term (current) drug therapy 03/29/2021   Peripheral polyneuropathy 01/30/2020   Polymyositis ([place])    Primary insomnia    Proteinuria 09/07/2020   Renal insufficiency 09/06/2020   Respiratory disorder concurrent with and due to microscopic polyangiitis ([place]) 08/12/2021   Rheumatoid factor positive 03/29/2021   Sepsis ([place]) 04/19/2019   Skin cancer    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/17/2020   Transaminitis    Trigger finger 05/03/2018   Type 2 diabetes mellitus with hyperglycemia, with long-term current use of insulin ([place]) 04/23/2021   UC (ulcerative colitis) ([place])    UTI (urinary tract infection)    Vasculitis ([place]) 03/29/2021  Objective:  Physical Exam: Vascular: DP/PT pulses 2/4 bilateral. CFT <3 seconds. Absent hair growth on digits. Edema noted to bilateral lower extremities. Xerosis noted  bilaterally.  Skin. No lacerations or abrasions bilateral feet. Nails 1-5 bilateral  are thickened discolored and elongated with subungual debris.  Musculoskeletal: MMT 5/5 bilateral lower extremities in DF, PF, Inversion and Eversion. Deceased ROM in DF of ankle joint.  Neurological: Sensation intact to light touch. Protective sensation diminished bilateral.    Assessment:   1. Pain due to onychomycosis of toenails of both feet   2. Type 2 diabetes mellitus with hyperglycemia, with long-term current use of insulin (HCC)   3. Peripheral polyneuropathy      Plan:  Patient was evaluated and treated and all questions answered. -Discussed and educated patient on diabetic foot care, especially with  regards to the vascular, neurological and musculoskeletal systems.  -Stressed the importance of good glycemic control and the detriment of not  controlling glucose levels in relation to the foot. -Discussed supportive shoes at all times and checking feet regularly.  -Mechanically debrided all nails 1-5 bilateral using sterile nail nipper and filed with dremel without incident  -Answered all patient questions -Patient to return  in 3 months for at risk foot care -Patient advised to call the office if any problems or questions arise in the meantime.   Lorenda Peck, DPM

## 2022-10-15 NOTE — Telephone Encounter (Signed)
Called and spoke with patient. Patient aware DME order placed to [facility] to discontinue home O2 per patient request.  Nothing further at this time.

## 2022-10-20 DIAGNOSIS — M65311 Trigger thumb, right thumb: Secondary | ICD-10-CM | POA: Diagnosis not present

## 2022-10-21 ENCOUNTER — Encounter: Payer: Self-pay | Admitting: Family Medicine

## 2022-10-21 ENCOUNTER — Ambulatory Visit (INDEPENDENT_AMBULATORY_CARE_PROVIDER_SITE_OTHER): Payer: Medicare HMO | Admitting: Family Medicine

## 2022-10-21 VITALS — BP 128/74 | HR 58 | Temp 97.3°F | Ht 66.0 in | Wt 172.0 lb

## 2022-10-21 DIAGNOSIS — E559 Vitamin D deficiency, unspecified: Secondary | ICD-10-CM

## 2022-10-21 DIAGNOSIS — M317 Microscopic polyangiitis: Secondary | ICD-10-CM

## 2022-10-21 DIAGNOSIS — J84112 Idiopathic pulmonary fibrosis: Secondary | ICD-10-CM

## 2022-10-21 DIAGNOSIS — I2510 Atherosclerotic heart disease of native coronary artery without angina pectoris: Secondary | ICD-10-CM

## 2022-10-21 DIAGNOSIS — N1831 Chronic kidney disease, stage 3a: Secondary | ICD-10-CM | POA: Diagnosis not present

## 2022-10-21 DIAGNOSIS — N390 Urinary tract infection, site not specified: Secondary | ICD-10-CM

## 2022-10-21 DIAGNOSIS — E1142 Type 2 diabetes mellitus with diabetic polyneuropathy: Secondary | ICD-10-CM | POA: Diagnosis not present

## 2022-10-21 DIAGNOSIS — E782 Mixed hyperlipidemia: Secondary | ICD-10-CM

## 2022-10-21 DIAGNOSIS — I129 Hypertensive chronic kidney disease with stage 1 through stage 4 chronic kidney disease, or unspecified chronic kidney disease: Secondary | ICD-10-CM

## 2022-10-21 HISTORY — DX: Vitamin D deficiency, unspecified: E55.9

## 2022-10-21 LAB — POCT URINALYSIS DIPSTICK
Appearance: NEGATIVE
Bilirubin, UA: NEGATIVE
Blood, UA: NEGATIVE
Glucose, UA: NEGATIVE
Ketones, UA: NEGATIVE
Leukocytes, UA: NEGATIVE
Nitrite, UA: NEGATIVE
Protein, UA: NEGATIVE
Spec Grav, UA: 1.015 (ref 1.010–1.025)
Urobilinogen, UA: NEGATIVE E.U./dL — AB
pH, UA: 6 (ref 5.0–8.0)

## 2022-10-21 NOTE — Assessment & Plan Note (Signed)
Check vitamin D. 

## 2022-10-21 NOTE — Assessment & Plan Note (Addendum)
Await labs/testing for assessment and recommendations. Consider restarting crestor versus trying alternative. Continue to work on eating a healthy diet and exercise.  Labs drawn today.

## 2022-10-21 NOTE — Assessment & Plan Note (Signed)
Continue to follow up with specialist.

## 2022-10-21 NOTE — Assessment & Plan Note (Addendum)
The current medical regimen is effective;  continue present plan and medications. Consider restart crestor. Continue aspirin 81 mg daily

## 2022-10-21 NOTE — Assessment & Plan Note (Signed)
Management per specialist. 

## 2022-10-21 NOTE — Assessment & Plan Note (Signed)
Stable. Check cmp  

## 2022-10-21 NOTE — Assessment & Plan Note (Addendum)
Control: good Recommend check sugars fasting daily. Recommend check feet daily. Recommend annual eye exams. Medicines: remain off metformin Continue to work on eating a healthy diet and exercise.  Labs drawn today.

## 2022-10-21 NOTE — Progress Notes (Signed)
Subjective:  Patient ID: Kerry Matthews, male    DOB: [birth year]  Age: 87 y.o. MRN: [id number]  Chief Complaint  Patient presents with   Diabetes   Hyperlipidemia    Diabetes:  Complications: neuropathy Glucose checking: before meals and before bed  Glucose logs: 93-120 Hypoglycemia: none Current medications: Patient stopped taking this metformin over 1 week ago due to itching which he states that it has helped. Taking Gabapentin 400 mg 1 capsule BID daily. Last Eye Exam: 04/02/2021. Needs to make an appointment Foot checks: daily.    Hyperlipidemia: Current medications: Patient not taking rosuvastatin currently was told to hold due to elevated liver enzymes.   Hypertensive heart disease. Has history of stent  Current medications: lasix 20 mg once daily as needed, aspirin 81 mg daily,    Diet: eating healthy. Exercise: home physical therapy and OT.      Microscopic polyangitis: pulmonary and rheumatology. on rituxin 100 mg every 6 months.  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 Sees pulmonology. No longer needing oxygen.   B12 deficiency: otc b12 1000 mcg once daily.   Vitamin D deficiency: taking vitamin D 50,000 take 1 capsule once weekly.       07/08/2022    8:31 AM 04/08/2022    9:34 AM 03/31/2022    7:43 AM 12/26/2021    8:02 AM 12/06/2021   10:40 AM  Depression screen PHQ 2/9  Decreased Interest 0 0 0 0 0  Down, Depressed, Hopeless 0 0 0 0 0  PHQ - 2 Score 0 0 0 0 0         12/26/2021    8:01 AM 03/31/2022    7:43 AM 04/08/2022    9:35 AM 07/08/2022    8:31 AM 09/22/2022    3:45 PM  Fall Risk  Falls in the past year? 0  1 1 0  Was there an injury with Fall? 0 '1 1 1 '$ 0  Fall Risk Category Calculator 0  3 2 0  Fall Risk Category (Retired) Low  High Moderate   (RETIRED) Patient Fall Risk Level  Moderate fall risk Moderate fall risk Low fall risk   Patient at Risk for Falls Due to  History of fall(s) History of fall(s);Impaired balance/gait  History of fall(s) Impaired balance/gait  Fall risk Follow up Falls evaluation completed Falls evaluation completed Education provided;Falls prevention discussed;Falls evaluation completed Falls evaluation completed Falls evaluation completed  Fall risk Follow up - Comments   currently doing PT and OT        Review of Systems  Constitutional:  Negative for appetite change, fatigue and fever.  HENT:  Negative for congestion, ear pain, sinus pressure and sore throat.   Respiratory:  Negative for cough, chest tightness, shortness of breath and wheezing.   Cardiovascular:  Negative for chest pain and palpitations.  Gastrointestinal:  Negative for abdominal pain, constipation, diarrhea, nausea and vomiting.  Genitourinary:  Negative for dysuria and hematuria.  Musculoskeletal:  Negative for arthralgias, back pain, joint swelling and myalgias.  Skin:  Negative for rash.  Neurological:  Negative for dizziness, weakness and headaches.  Psychiatric/Behavioral:  Negative for dysphoric mood. The patient is not nervous/anxious.     Current Outpatient Medications on File Prior to Visit  Medication Sig Dispense Refill   aspirin EC 81 MG tablet Take 1 tablet (81 mg total) by mouth daily. Swallow whole. 30 tablet 11   calamine lotion Apply 1 Application topically as needed for itching. 177 mL 0   doxycycline (VIBRAMYCIN)  100 MG capsule Take 100 mg by mouth 2 (two) times daily.     Emollient (LUBRIDERM SERIOUSLY SENSITIVE) LOTN Apply 1 Application topically as needed (itching).  0   furosemide (LASIX) 20 MG tablet TAKE 1 TABLET EVERY DAY AS NEEDED (Patient taking differently: Take 20 mg by mouth as needed for fluid or edema.) 90 tablet 1   gabapentin (NEURONTIN) 400 MG capsule Take 1 capsule (400 mg total) by mouth 2 (two) times daily. 1 capsule 0   glucose blood (TRUE METRIX BLOOD GLUCOSE TEST) test strip TEST BLOOD SUGAR THREE TIMES DAILY BEFORE MEALS 300 strip 3   Multiple Vitamins-Minerals  (PRESERVISION AREDS 2) CAPS Take 1 capsule by mouth 2 (two) times daily after a meal.     nitroGLYCERIN (NITROSTAT) 0.4 MG SL tablet Place 1 tablet (0.4 mg total) under the tongue every 5 (five) minutes as needed for chest pain. 25 tablet 9   omeprazole (PRILOSEC) 20 MG capsule TAKE 1 CAPSULE EVERY DAY 90 capsule 1   polyethylene glycol (MIRALAX / GLYCOLAX) 17 g packet Take 17 g by mouth daily.     riTUXimab (RITUXAN) 500 MG/50ML injection Inject into the vein every 6 (six) months.     vitamin B-12 (CYANOCOBALAMIN) 1000 MCG tablet Take 1 tablet (1,000 mcg total) by mouth daily. 30 tablet 0   Vitamin D, Ergocalciferol, (DRISDOL) 1.25 MG (50000 UNIT) CAPS capsule Take 1 capsule (50,000 Units total) by mouth every 7 (seven) days. 4 capsule 2   metFORMIN (GLUCOPHAGE) 500 MG tablet Take 0.5 tablets (250 mg total) by mouth daily. (Patient not taking: Reported on 10/21/2022) 1 tablet 0   OXYGEN Inhale 1 L into the lungs at bedtime. (Patient not taking: Reported on 10/21/2022)     rosuvastatin (CRESTOR) 20 MG tablet Take 1 tablet (20 mg total) by mouth daily. (Patient not taking: Reported on 09/19/2022) 90 tablet 1   No current facility-administered medications on file prior to visit.   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  ANCA antibodies- myeloperoxidase ABS-greater than 100, ANCA proteinase 3-less than 3.5 04/02/2019- ANCA titers- p-ANCA +1: 160, C ANCA-less than 1: 20, atypical p-ANCA titer-less than 1: 20   Abnormal CT of the chest    Abnormal findings on diagnostic imaging of lung 04/12/2019   04/01/2019-CT chest with contrast- no acute process in chest abdomen or pelvis, interstitial lung disease suspicious for early or mild UIP, pulmonary artery enlargement suggest PAH    Acute pain of left knee 11/30/2019   Alpha-1-antitrypsin deficiency carrier 05/04/2019   Atherosclerotic heart disease of native coronary artery without angina pectoris 05/03/2018    Body mass index (BMI) 29.0-29.9, adult 01/05/2020   Bradycardia    Chronic kidney disease, stage 3a ([place]) 08/12/2021   CKD (chronic kidney disease) 05/03/2018   Coronary artery disease    Coronary artery disease involving native coronary artery of native heart without angina pectoris 05/03/2018   Diabetes mellitus type 2 in obese ([place]) 04/01/2019   Diabetic polyneuropathy associated with type 2 diabetes mellitus ([place]) 08/12/2021   Dyslipidemia associated with type 2 diabetes mellitus ([place]) 05/03/2018   Elevated rheumatoid factor 04/12/2019   04/03/2019-rheumatoid factor-95.4   Essential hypertension 05/03/2018   GAD (generalized anxiety disorder)    GERD without esophagitis 05/03/2018   Hematuria 09/07/2020   History of kidney stones    Hypertensive heart disease without heart failure 05/03/2018   Hyponatremia 08/12/2021   Idiopathic pulmonary fibrosis ([place]) 03/29/2021  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   Insomnia 05/03/2018   Interstitial pulmonary disease ([place]) 04/12/2019   04/01/2019-CT chest with contrast- no acute process in chest abdomen or pelvis, interstitial lung disease suspicious for early or mild UIP, pulmonary artery enlargement suggest PAH  04/02/2019-connective tissue work-up: Anti-Jo 1-negative Anti-DNA antibody double-stranded-negative Anti-scleroderma antibody-negative Sjogren's syndrome antibody-negative Sjogren's syndrome antibody-negative CK-31 CCP-6 E   Leukocytosis 03/08/2020   Low vitamin B12 level 04/03/2019   Lower extremity edema    Medicare annual wellness visit, subsequent 08/09/2020   Microscopic polyangiitis ([place]) 03/29/2021   Mixed diabetic hyperlipidemia associated with type 2 diabetes mellitus ([place]) 08/12/2021   Mixed hyperlipidemia 05/03/2018   Myalgia 04/19/2019   Osteoarthritis    Other emphysema ([place])    Other long term (current) drug therapy 03/29/2021   Peripheral polyneuropathy 01/30/2020   Polymyositis ([place])    Primary insomnia     Proteinuria 09/07/2020   Renal insufficiency 09/06/2020   Respiratory disorder concurrent with and due to microscopic polyangiitis ([place]) 08/12/2021   Rheumatoid factor positive 03/29/2021   Sepsis ([place]) 04/19/2019   Skin cancer    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/17/2020   Transaminitis    Trigger finger 05/03/2018   Type 2 diabetes mellitus with hyperglycemia, with long-term current use of insulin ([place]) 04/23/2021   UC (ulcerative colitis) ([place])    UTI (urinary tract infection)    Vasculitis ([place]) 03/29/2021   Past Surgical History:  Procedure Laterality Date   ANGIOPLASTY  2010   BACK SURGERY     CATARACT EXTRACTION     COLONOSCOPY  06/16/2005   Mild colitis involving splenic flexure. Colonic polyps, status post polypectomy. Mild pancolonic diverticulitits. Internal hemorrhoids.    ERCP N/A 09/13/2022   Procedure: ENDOSCOPIC RETROGRADE CHOLANGIOPANCREATOGRAPHY (ERCP);  Surgeon: Gatha Mayer, MD;  Location: [facility];  Service: Gastroenterology;  Laterality: N/A;   ESOPHAGOGASTRODUODENOSCOPY  04/26/2003   Irregular Z line suggestive of GERD. Mild gastritis status post CLO testing.    REMOVAL OF STONES  09/13/2022   Procedure: REMOVAL OF STONES;  Surgeon: Gatha Mayer, MD;  Location: [facility];  Service: Gastroenterology;;   Joan Mayans  09/13/2022   Procedure: Joan Mayans;  Surgeon: Gatha Mayer, MD;  Location: [facility];  Service: Gastroenterology;;   TOTAL KNEE ARTHROPLASTY Left 12/17/2020   Procedure: LEFT TOTAL KNEE ARTHROPLASTY;  Surgeon: Leandrew Koyanagi, MD;  Location: [facility];  Service: Orthopedics;  Laterality: Left;   TRIGGER FINGER RELEASE      Family History  Problem Relation Age of Onset   Tuberculosis Mother    Stroke Father    Pancreatic cancer Sister    Heart attack Sister    Lung disease Sister    Clotting disorder Brother    Colon cancer Neg Hx    Esophageal cancer Neg Hx    Social History   Socioeconomic History   Marital  status: Married    Spouse name: Not on file   Number of children: 2   Years of education: Not on file   Highest education level: Not on file  Occupational History   Occupation: retired  Tobacco Use   Smoking status: Never   Smokeless tobacco: Never  Vaping Use   Vaping Use: Never used  Substance and Sexual Activity   Alcohol use: Never   Drug use: Never   Sexual activity: Not on file  Other Topics Concern   Not on file  Social History Narrative   Not on file   Social Determinants  of Health   Financial Resource Strain: Low Risk  (05/07/2022)   Overall Financial Resource Strain (CARDIA)    Difficulty of Paying Living Expenses: Not hard at all  Food Insecurity: No Food Insecurity (09/16/2022)   Hunger Vital Sign    Worried About Running Out of Food in the Last Year: Never true    Ran Out of Food in the Last Year: Never true  Transportation Needs: No Transportation Needs (09/16/2022)   PRAPARE - Hydrologist (Medical): No    Lack of Transportation (Non-Medical): No  Physical Activity: Sufficiently Active (06/07/2020)   Exercise Vital Sign    Days of Exercise per Week: 5 days    Minutes of Exercise per Session: 30 min  Stress: Not on file  Social Connections: Not on file    Objective:  BP 128/74 (BP Location: Left Arm, Patient Position: Sitting)   Pulse (!) 58   Temp (!) 97.3 F (36.3 C) (Temporal)   Ht '5\' 6"'$  (1.676 m)   Wt 172 lb (78 kg)   SpO2 94%   BMI 27.76 kg/m      10/21/2022    9:05 AM 09/22/2022    3:42 PM 09/19/2022   10:12 AM  BP/Weight  Systolic BP 0000000 [id number]  Diastolic BP 74 70 62  Wt. (Lbs) 172 172 171  BMI 27.76 kg/m2 27.76 kg/m2 27.6 kg/m2    Physical Exam Vitals reviewed.  Constitutional:      Appearance: Normal appearance. He is normal weight.  Cardiovascular:     Rate and Rhythm: Normal rate and regular rhythm.     Heart sounds: Normal heart sounds.  Pulmonary:     Effort: Pulmonary effort is normal.      Breath sounds: Normal breath sounds.  Abdominal:     General: Abdomen is flat. Bowel sounds are normal.     Palpations: Abdomen is soft.     Tenderness: There is no abdominal tenderness.  Neurological:     Mental Status: He is alert and oriented to person, place, and time.  Psychiatric:        Mood and Affect: Mood normal.        Behavior: Behavior normal.     Diabetic Foot Exam - Simple   Simple Foot Form Diabetic Foot exam was performed with the following findings: Yes 10/21/2022  8:38 AM  Visual Inspection No deformities, no ulcerations, no other skin breakdown bilaterally: Yes See comments: Yes Sensation Testing Intact to touch and monofilament testing bilaterally: Yes Pulse Check Posterior Tibialis and Dorsalis pulse intact bilaterally: Yes Comments Callus bottom right foot, thickened nails bilateral.      Lab Results  Component Value Date   WBC 10.7 09/22/2022   HGB 13.6 09/22/2022   HCT 40.1 09/22/2022   PLT 369 09/22/2022   GLUCOSE 112 (H) 09/22/2022   CHOL 160 07/08/2022   TRIG 136 07/08/2022   HDL 42 07/08/2022   LDLCALC 94 07/08/2022   ALT 21 09/22/2022   AST 19 09/22/2022   NA 140 09/22/2022   K 4.9 09/22/2022   CL 102 09/22/2022   CREATININE 1.19 09/22/2022   BUN 14 09/22/2022   CO2 22 09/22/2022   TSH 2.028 09/11/2022   INR 1.1 09/11/2022   HGBA1C 6.1 (H) 07/08/2022   MICROALBUR 10 04/11/2021      Assessment & Plan:    Diabetic polyneuropathy associated with type 2 diabetes mellitus ([place]) Assessment & Plan: Control: good Recommend check sugars fasting daily.  Recommend check feet daily. Recommend annual eye exams. Medicines: remain off metformin Continue to work on eating a healthy diet and exercise.  Labs drawn today.     Orders: -     Hemoglobin A1c -     Microalbumin / creatinine urine ratio  Coronary artery disease involving native coronary artery of native heart without angina pectoris Assessment & Plan: The current medical  regimen is effective;  continue present plan and medications. Consider restart crestor. Continue aspirin 81 mg daily   Orders: -     CBC With Diff/Platelet -     Comprehensive metabolic panel  Mixed hyperlipidemia Assessment & Plan: Await labs/testing for assessment and recommendations. Consider restarting crestor versus trying alternative. Continue to work on eating a healthy diet and exercise.  Labs drawn today.    Orders: -     Lipid panel  Idiopathic pulmonary fibrosis ([place]) Assessment & Plan: Continue to follow up with specialist.   Microscopic polyangiitis ([place]) Assessment & Plan: Management per specialist.   Chronic kidney disease, stage 3a ([place]) Assessment & Plan: Stable. Check cmp    Vitamin D deficiency Assessment & Plan: Check vitamin D.   Orders: -     VITAMIN D 25 Hydroxy (Vit-D Deficiency, Fractures)  Recurrent UTI Assessment & Plan: UA normal.  Orders: -     POCT urinalysis dipstick     No orders of the defined types were placed in this encounter.   Orders Placed This Encounter  Procedures   CBC With Diff/Platelet   Comprehensive metabolic panel   Lipid panel   Hemoglobin A1c   Microalbumin / creatinine urine ratio   VITAMIN D 25 Hydroxy (Vit-D Deficiency, Fractures)   POCT urinalysis dipstick     Follow-up: No follow-ups on file.   I,Lauren M Auman,acting as a scribe for [name], MD.,have documented all relevant documentation on the behalf of [name], MD,as directed by  [name], MD while in the presence of [name], MD.   An After Visit Summary was printed and given to the patient.  I attest that I have reviewed this visit and agree with the plan scribed by my staff.   [name], MD [facility] [phone]

## 2022-10-21 NOTE — Assessment & Plan Note (Signed)
UA normal.

## 2022-10-22 ENCOUNTER — Other Ambulatory Visit: Payer: Self-pay | Admitting: Family Medicine

## 2022-10-22 LAB — CBC WITH DIFF/PLATELET
Basophils Absolute: 0.1 10*3/uL (ref 0.0–0.2)
Basos: 1 %
EOS (ABSOLUTE): 0.7 10*3/uL — ABNORMAL HIGH (ref 0.0–0.4)
Eos: 6 %
Hematocrit: 46.1 % (ref 37.5–51.0)
Hemoglobin: 15.3 g/dL (ref 13.0–17.7)
Immature Grans (Abs): 0 10*3/uL (ref 0.0–0.1)
Immature Granulocytes: 0 %
Lymphocytes Absolute: 3.7 10*3/uL — ABNORMAL HIGH (ref 0.7–3.1)
Lymphs: 33 %
MCH: 29.3 pg (ref 26.6–33.0)
MCHC: 33.2 g/dL (ref 31.5–35.7)
MCV: 88 fL (ref 79–97)
Monocytes Absolute: 0.9 10*3/uL (ref 0.1–0.9)
Monocytes: 8 %
Neutrophils Absolute: 5.9 10*3/uL (ref 1.4–7.0)
Neutrophils: 52 %
Platelets: 279 10*3/uL (ref 150–450)
RBC: 5.23 x10E6/uL (ref 4.14–5.80)
RDW: 13.1 % (ref 11.6–15.4)
WBC: 11.3 10*3/uL — ABNORMAL HIGH (ref 3.4–10.8)

## 2022-10-22 LAB — LIPID PANEL
Chol/HDL Ratio: 6.9 ratio — ABNORMAL HIGH (ref 0.0–5.0)
Cholesterol, Total: 271 mg/dL — ABNORMAL HIGH (ref 100–199)
HDL: 39 mg/dL — ABNORMAL LOW (ref >39)
LDL Chol Calc (NIH): 203 mg/dL — ABNORMAL HIGH (ref 0–99)
Triglycerides: 153 mg/dL — ABNORMAL HIGH (ref 0–149)
VLDL Cholesterol Cal: 29 mg/dL (ref 5–40)

## 2022-10-22 LAB — MICROALBUMIN / CREATININE URINE RATIO
Creatinine, Urine: 53.6 mg/dL
Microalb/Creat Ratio: <6 mg/g creat (ref 0–29)
Microalbumin, Urine: <3 ug/mL

## 2022-10-22 LAB — CARDIOVASCULAR RISK ASSESSMENT

## 2022-10-22 LAB — COMPREHENSIVE METABOLIC PANEL
ALT: 15 IU/L (ref 0–44)
AST: 24 IU/L (ref 0–40)
Albumin/Globulin Ratio: 2 (ref 1.2–2.2)
Albumin: 4.3 g/dL (ref 3.7–4.7)
Alkaline Phosphatase: 172 IU/L — ABNORMAL HIGH (ref 44–121)
BUN/Creatinine Ratio: 16 (ref 10–24)
BUN: 18 mg/dL (ref 8–27)
Bilirubin Total: 0.8 mg/dL (ref 0.0–1.2)
CO2: 21 mmol/L (ref 20–29)
Calcium: 9.2 mg/dL (ref 8.6–10.2)
Chloride: 103 mmol/L (ref 96–106)
Creatinine, Ser: 1.1 mg/dL (ref 0.76–1.27)
Globulin, Total: 2.2 g/dL (ref 1.5–4.5)
Glucose: 113 mg/dL — ABNORMAL HIGH (ref 70–99)
Potassium: 4.3 mmol/L (ref 3.5–5.2)
Sodium: 140 mmol/L (ref 134–144)
Total Protein: 6.5 g/dL (ref 6.0–8.5)
eGFR: 66 mL/min/{1.73_m2} (ref >59)

## 2022-10-22 LAB — HEMOGLOBIN A1C
Est. average glucose Bld gHb Est-mCnc: 131 mg/dL
Hgb A1c MFr Bld: 6.2 % — ABNORMAL HIGH (ref 4.8–5.6)

## 2022-10-22 LAB — VITAMIN D 25 HYDROXY (VIT D DEFICIENCY, FRACTURES): Vit D, 25-Hydroxy: 31.7 ng/mL (ref 30.0–100.0)

## 2022-10-24 ENCOUNTER — Other Ambulatory Visit: Payer: Self-pay

## 2022-10-24 MED ORDER — REPATHA 140 MG/ML ~~LOC~~ SOSY: 140.0000 mg | PREFILLED_SYRINGE | SUBCUTANEOUS | 2 refills | Status: DC

## 2022-10-27 DIAGNOSIS — M65352 Trigger finger, left little finger: Secondary | ICD-10-CM

## 2022-10-27 DIAGNOSIS — Z4789 Encounter for other orthopedic aftercare: Secondary | ICD-10-CM | POA: Diagnosis not present

## 2022-10-27 HISTORY — DX: Trigger finger, left little finger: M65.352

## 2022-10-30 ENCOUNTER — Telehealth: Payer: Self-pay

## 2022-10-30 NOTE — Telephone Encounter (Signed)
PA submitted and approved via covermymeds for repatha.

## 2022-11-03 ENCOUNTER — Telehealth: Payer: Self-pay | Admitting: Pulmonary Disease

## 2022-11-03 DIAGNOSIS — M25641 Stiffness of right hand, not elsewhere classified: Secondary | ICD-10-CM | POA: Diagnosis not present

## 2022-11-03 NOTE — Telephone Encounter (Signed)
Spoke with patient.  Patient stated Dr. Tobie Poet is wanting to start him on Repatha injections for his cholesterol.  Patient is concerned because potential side effects are respiratory infections and flu.  Patient requested Dr. Matilde Bash opinion about starting Repatha with his lung issues.   Message routed to Dr. Vaughan Browner to advise

## 2022-11-04 NOTE — Telephone Encounter (Signed)
I do not believe that these side effects of the lung are frequent or serious. It should be ok to take the medication for the cholesterol control

## 2022-11-05 NOTE — Telephone Encounter (Signed)
Called and spoke with patient.  Dr. Mannam's recommendations given.  Understanding stated.  Nothing further at this time. °

## 2022-11-17 DIAGNOSIS — M79645 Pain in left finger(s): Secondary | ICD-10-CM

## 2022-11-17 DIAGNOSIS — M65352 Trigger finger, left little finger: Secondary | ICD-10-CM | POA: Diagnosis not present

## 2022-11-17 HISTORY — DX: Pain in left finger(s): M79.645

## 2022-12-16 DIAGNOSIS — H35013 Changes in retinal vascular appearance, bilateral: Secondary | ICD-10-CM | POA: Diagnosis not present

## 2022-12-16 DIAGNOSIS — H524 Presbyopia: Secondary | ICD-10-CM | POA: Diagnosis not present

## 2022-12-16 DIAGNOSIS — E113291 Type 2 diabetes mellitus with mild nonproliferative diabetic retinopathy without macular edema, right eye: Secondary | ICD-10-CM | POA: Diagnosis not present

## 2022-12-16 DIAGNOSIS — Z961 Presence of intraocular lens: Secondary | ICD-10-CM | POA: Diagnosis not present

## 2022-12-16 DIAGNOSIS — Z7984 Long term (current) use of oral hypoglycemic drugs: Secondary | ICD-10-CM | POA: Diagnosis not present

## 2022-12-16 LAB — HM DIABETES EYE EXAM

## 2022-12-17 ENCOUNTER — Encounter: Payer: Self-pay | Admitting: Family Medicine

## 2022-12-19 ENCOUNTER — Ambulatory Visit: Payer: Medicare HMO | Admitting: Orthopaedic Surgery

## 2022-12-19 ENCOUNTER — Other Ambulatory Visit (INDEPENDENT_AMBULATORY_CARE_PROVIDER_SITE_OTHER): Payer: Medicare HMO

## 2022-12-19 DIAGNOSIS — Z96652 Presence of left artificial knee joint: Secondary | ICD-10-CM

## 2022-12-19 NOTE — Progress Notes (Signed)
Office Visit Note   Patient: Kerry Matthews           Date of Birth: [birth year]           MRN: [id number] Visit Date: 12/19/2022              Requested by: Blane Ohara, MD [id number] [place],  [place] [address] PCP: Blane Ohara, MD   Assessment & Plan: Visit Diagnoses:  1. Hx of total knee replacement, left     Plan: Kerry Matthews is now 2 years status post left total knee replacement.  Is hard to say what is causing the pain.  He could be symptomatic from the osteophyte that has formed from the patella.  I do not see any evidence of infection.  Certainly does not sound like he is held back by this in any way with his activities.  His family members seem to think that it does not really bother him that badly.  I would recommend using Voltaren gel and backing off on some activity for about 6 months.  If it continues to bother him then he should come back to see [place] again.  Otherwise he can follow-up as needed.  Follow-Up Instructions: Return if symptoms worsen or fail to improve.   Orders:  Orders Placed This Encounter  Procedures   XR Knee 1-2 Views Left   No orders of the defined types were placed in this encounter.     Procedures: No procedures performed   Clinical Data: No additional findings.   Subjective: Chief Complaint  Patient presents with   Left Knee - Follow-up    HPI  Patient is a 87 year old gentleman 2 years status post left total knee replacement.  He states that in the last few months he has noticed the lateral sided left knee pain that is associated with activity.  He is not limited by the pain in any way and continues to be very active for his age.  He does yard work frequently.  He is a bit of a poor historian.  He states that it hurts when he rolls on his left leg at night in bed.  He localizes the pain to the lateral patellar region.  Review of Systems  Constitutional: Negative.   HENT: Negative.    Eyes: Negative.    Respiratory: Negative.    Cardiovascular: Negative.   Gastrointestinal: Negative.   Endocrine: Negative.   Genitourinary: Negative.   Skin: Negative.   Allergic/Immunologic: Negative.   Neurological: Negative.   Hematological: Negative.   Psychiatric/Behavioral: Negative.    All other systems reviewed and are negative.    Objective: Vital Signs: There were no vitals taken for this visit.  Physical Exam Vitals and nursing note reviewed.  Constitutional:      Appearance: He is well-developed.  HENT:     Head: Normocephalic and atraumatic.  Eyes:     Pupils: Pupils are equal, round, and reactive to light.  Pulmonary:     Effort: Pulmonary effort is normal.  Abdominal:     Palpations: Abdomen is soft.  Musculoskeletal:        General: Normal range of motion.     Cervical back: Neck supple.  Skin:    General: Skin is warm.  Neurological:     Mental Status: He is alert and oriented to person, place, and time.  Psychiatric:        Behavior: Behavior normal.  Thought Content: Thought content normal.        Judgment: Judgment normal.     Ortho Exam  Examination left knee shows a fully healed surgical scar.  He has slight tenderness to the lateral portion of the patella.  The IT band is not really tender.  No real lateral joint line tenderness.  No joint effusion.  No signs of infection.  He has good range of motion.  Good varus valgus stability.  Specialty Comments:  No specialty comments available.  Imaging: XR Knee 1-2 Views Left  Result Date: 12/19/2022 Well-seated prosthesis without complication.  Small lateral patellar osteophyte    PMFS History: Patient Active Problem List   Diagnosis Date Noted   Vitamin D deficiency 10/21/2022   Pneumonia of left lower lobe due to infectious organism 10/05/2022   Leukocytosis 10/05/2022   Elevated liver enzymes 09/19/2022   Urinary frequency 09/19/2022   Swollen L ankle 09/19/2022   Acute gallstone pancreatitis  09/12/2022   SIRS (systemic inflammatory response syndrome) (HCC) 09/11/2022   Choledocholithiasis 09/11/2022   Gouty arthritis of left great toe 07/08/2022   Adult BMI 27.0-27.9 kg/sq m 07/08/2022   Chronic respiratory failure with hypoxia (HCC) 03/06/2022   Respiratory disorder concurrent with and due to microscopic polyangiitis (HCC) 08/12/2021   Hyponatremia 08/12/2021   Mixed diabetic hyperlipidemia associated with type 2 diabetes mellitus (HCC) 08/12/2021   Diabetic polyneuropathy associated with type 2 diabetes mellitus (HCC) 08/12/2021   Chronic kidney disease, stage 3a (HCC) 08/12/2021   Type 2 diabetes mellitus with hyperglycemia, with long-term current use of insulin (HCC) 04/23/2021   History of kidney stones 03/29/2021   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   Microscopic polyangiitis (HCC) 03/29/2021   Rheumatoid factor positive 03/29/2021   Vasculitis (HCC) 03/29/2021   Idiopathic pulmonary fibrosis (HCC) 03/29/2021   Other long term (current) drug therapy 03/29/2021   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/17/2020   Dysuria 09/07/2020   GAD (generalized anxiety disorder)    Osteoarthritis    Other emphysema (HCC)    Skin cancer    UC (ulcerative colitis) (HCC)    Recurrent UTI    Medicare annual wellness visit, subsequent 08/09/2020   Transaminitis 07/07/2020   Peripheral polyneuropathy 01/30/2020   Body mass index (BMI) 29.0-29.9, adult 01/05/2020   Alpha-1-antitrypsin deficiency carrier 05/04/2019   Elevated rheumatoid factor 04/12/2019   Interstitial pulmonary disease (HCC) 04/12/2019   Abnormal ANCA test 04/12/2019   Abnormal findings on diagnostic imaging of lung 04/12/2019   Polymyositis (HCC)    Low vitamin B12 level 04/03/2019   Abnormal CT of the chest    Diabetes mellitus type 2 in obese 04/01/2019   Hypertensive heart disease without heart failure 05/03/2018   Mixed hyperlipidemia 05/03/2018   Coronary artery disease involving native  coronary artery of native heart without angina pectoris 05/03/2018   GERD without esophagitis 05/03/2018   CKD (chronic kidney disease) 05/03/2018   Atherosclerotic heart disease of native coronary artery without angina pectoris 05/03/2018   Dyslipidemia associated with type 2 diabetes mellitus (HCC) 05/03/2018   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  ANCA antibodies- myeloperoxidase ABS-greater than 100, ANCA proteinase 3-less than 3.5 04/02/2019- ANCA titers- p-ANCA +1: 160, C ANCA-less than 1: 20, atypical p-ANCA titer-less than 1: 20   Abnormal CT of the chest    Abnormal findings on diagnostic imaging of lung 04/12/2019   04/01/2019-CT chest with contrast- no acute process in chest  abdomen or pelvis, interstitial lung disease suspicious for early or mild UIP, pulmonary artery enlargement suggest PAH    Acute pain of left knee 11/30/2019   Alpha-1-antitrypsin deficiency carrier 05/04/2019   Atherosclerotic heart disease of native coronary artery without angina pectoris 05/03/2018   Body mass index (BMI) 29.0-29.9, adult 01/05/2020   Bradycardia    Chronic kidney disease, stage 3a (HCC) 08/12/2021   CKD (chronic kidney disease) 05/03/2018   Coronary artery disease    Coronary artery disease involving native coronary artery of native heart without angina pectoris 05/03/2018   Diabetes mellitus type 2 in obese (HCC) 04/01/2019   Diabetic polyneuropathy associated with type 2 diabetes mellitus (HCC) 08/12/2021   Dyslipidemia associated with type 2 diabetes mellitus (HCC) 05/03/2018   Elevated rheumatoid factor 04/12/2019   04/03/2019-rheumatoid factor-95.4   Essential hypertension 05/03/2018   GAD (generalized anxiety disorder)    GERD without esophagitis 05/03/2018   Hematuria 09/07/2020   History of kidney stones    Hypertensive heart disease without heart failure 05/03/2018   Hyponatremia 08/12/2021   Idiopathic pulmonary fibrosis (HCC)  03/29/2021   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   Insomnia 05/03/2018   Interstitial pulmonary disease (HCC) 04/12/2019   04/01/2019-CT chest with contrast- no acute process in chest abdomen or pelvis, interstitial lung disease suspicious for early or mild UIP, pulmonary artery enlargement suggest PAH  04/02/2019-connective tissue work-up: Anti-Jo 1-negative Anti-DNA antibody double-stranded-negative Anti-scleroderma antibody-negative Sjogren's syndrome antibody-negative Sjogren's syndrome antibody-negative CK-31 CCP-6 E   Leukocytosis 03/08/2020   Low vitamin B12 level 04/03/2019   Lower extremity edema    Medicare annual wellness visit, subsequent 08/09/2020   Microscopic polyangiitis (HCC) 03/29/2021   Mixed diabetic hyperlipidemia associated with type 2 diabetes mellitus (HCC) 08/12/2021   Mixed hyperlipidemia 05/03/2018   Myalgia 04/19/2019   Osteoarthritis    Other emphysema (HCC)    Other long term (current) drug therapy 03/29/2021   Peripheral polyneuropathy 01/30/2020   Polymyositis (HCC)    Primary insomnia    Proteinuria 09/07/2020   Renal insufficiency 09/06/2020   Respiratory disorder concurrent with and due to microscopic polyangiitis (HCC) 08/12/2021   Rheumatoid factor positive 03/29/2021   Sepsis (HCC) 04/19/2019   Skin cancer    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/17/2020   Transaminitis    Trigger finger 05/03/2018   Type 2 diabetes mellitus with hyperglycemia, with long-term current use of insulin (HCC) 04/23/2021   UC (ulcerative colitis) (HCC)    UTI (urinary tract infection)    Vasculitis (HCC) 03/29/2021    Family History  Problem Relation Age of Onset   Tuberculosis Mother    Stroke Father    Pancreatic cancer Sister    Heart attack Sister    Lung disease Sister    Clotting disorder Brother    Colon cancer Neg Hx    Esophageal cancer Neg Hx     Past Surgical History:  Procedure Laterality Date   ANGIOPLASTY  2010   BACK SURGERY      CATARACT EXTRACTION     COLONOSCOPY  06/16/2005   Mild colitis involving splenic flexure. Colonic polyps, status post polypectomy. Mild pancolonic diverticulitits. Internal hemorrhoids.    ERCP N/A 09/13/2022   Procedure: ENDOSCOPIC RETROGRADE CHOLANGIOPANCREATOGRAPHY (ERCP);  Surgeon: Iva Boop, MD;  Location: [facility];  Service: Gastroenterology;  Laterality: N/A;   ESOPHAGOGASTRODUODENOSCOPY  04/26/2003   Irregular Z line suggestive of GERD. Mild gastritis status post CLO testing.    REMOVAL OF STONES  09/13/2022   Procedure:  REMOVAL OF STONES;  Surgeon: Iva Boop, MD;  Location: [facility];  Service: Gastroenterology;;   Dennison Mascot  09/13/2022   Procedure: Dennison Mascot;  Surgeon: Iva Boop, MD;  Location: [facility];  Service: Gastroenterology;;   TOTAL KNEE ARTHROPLASTY Left 12/17/2020   Procedure: LEFT TOTAL KNEE ARTHROPLASTY;  Surgeon: Tarry Kos, MD;  Location: [facility];  Service: Orthopedics;  Laterality: Left;   TRIGGER FINGER RELEASE     Social History   Occupational History   Occupation: retired  Tobacco Use   Smoking status: Never   Smokeless tobacco: Never  Vaping Use   Vaping Use: Never used  Substance and Sexual Activity   Alcohol use: Never   Drug use: Never   Sexual activity: Not on file       d.  Follow-Up Instructions: Return if symptoms worsen or fail to improve.   Orders:  Orders Placed This Encounter  Procedures   XR Knee 1-2 Views Left   No orders of the defined types were placed in this encounter.   Imaging: XR Knee 1-2 Views Left  Result Date: 12/19/2022 Well-seated prosthesis without complication   PMFS History: Patient Active Problem List   Diagnosis Date Noted   Vitamin D deficiency 10/21/2022   Pneumonia of left lower lobe due to infectious organism 10/05/2022   Leukocytosis 10/05/2022   Elevated liver enzymes 09/19/2022   Urinary frequency 09/19/2022   Swollen L ankle 09/19/2022   Acute gallstone  pancreatitis 09/12/2022   SIRS (systemic inflammatory response syndrome) (HCC) 09/11/2022   Choledocholithiasis 09/11/2022   Gouty arthritis of left great toe 07/08/2022   Adult BMI 27.0-27.9 kg/sq m 07/08/2022   Chronic respiratory failure with hypoxia (HCC) 03/06/2022   Respiratory disorder concurrent with and due to microscopic polyangiitis (HCC) 08/12/2021   Hyponatremia 08/12/2021   Mixed diabetic hyperlipidemia associated with type 2 diabetes mellitus (HCC) 08/12/2021   Diabetic polyneuropathy associated with type 2 diabetes mellitus (HCC) 08/12/2021   Chronic kidney disease, stage 3a (HCC) 08/12/2021   Type 2 diabetes mellitus with hyperglycemia, with long-term current use of insulin (HCC) 04/23/2021   History of kidney stones 03/29/2021   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   Microscopic polyangiitis (HCC) 03/29/2021   Rheumatoid factor positive 03/29/2021   Vasculitis (HCC) 03/29/2021   Idiopathic pulmonary fibrosis (HCC) 03/29/2021   Other long term (current) drug therapy 03/29/2021   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/17/2020   Dysuria 09/07/2020   GAD (generalized anxiety disorder)    Osteoarthritis    Other emphysema (HCC)    Skin cancer    UC (ulcerative colitis) (HCC)    Recurrent UTI    Medicare annual wellness visit, subsequent 08/09/2020   Transaminitis 07/07/2020   Peripheral polyneuropathy 01/30/2020   Body mass index (BMI) 29.0-29.9, adult 01/05/2020   Alpha-1-antitrypsin deficiency carrier 05/04/2019   Elevated rheumatoid factor 04/12/2019   Interstitial pulmonary disease (HCC) 04/12/2019   Abnormal ANCA test 04/12/2019   Abnormal findings on diagnostic imaging of lung 04/12/2019   Polymyositis (HCC)    Low vitamin B12 level 04/03/2019   Abnormal CT of the chest    Diabetes mellitus type 2 in obese 04/01/2019   Hypertensive heart disease without heart failure 05/03/2018   Mixed hyperlipidemia 05/03/2018   Coronary artery disease involving  native coronary artery of native heart without angina pectoris 05/03/2018   GERD without esophagitis 05/03/2018   CKD (chronic kidney disease) 05/03/2018   Atherosclerotic heart disease of native coronary artery without angina pectoris 05/03/2018  Dyslipidemia associated with type 2 diabetes mellitus (HCC) 05/03/2018   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  ANCA antibodies- myeloperoxidase ABS-greater than 100, ANCA proteinase 3-less than 3.5 04/02/2019- ANCA titers- p-ANCA +1: 160, C ANCA-less than 1: 20, atypical p-ANCA titer-less than 1: 20   Abnormal CT of the chest    Abnormal findings on diagnostic imaging of lung 04/12/2019   04/01/2019-CT chest with contrast- no acute process in chest abdomen or pelvis, interstitial lung disease suspicious for early or mild UIP, pulmonary artery enlargement suggest PAH    Acute pain of left knee 11/30/2019   Alpha-1-antitrypsin deficiency carrier 05/04/2019   Atherosclerotic heart disease of native coronary artery without angina pectoris 05/03/2018   Body mass index (BMI) 29.0-29.9, adult 01/05/2020   Bradycardia    Chronic kidney disease, stage 3a (HCC) 08/12/2021   CKD (chronic kidney disease) 05/03/2018   Coronary artery disease    Coronary artery disease involving native coronary artery of native heart without angina pectoris 05/03/2018   Diabetes mellitus type 2 in obese (HCC) 04/01/2019   Diabetic polyneuropathy associated with type 2 diabetes mellitus (HCC) 08/12/2021   Dyslipidemia associated with type 2 diabetes mellitus (HCC) 05/03/2018   Elevated rheumatoid factor 04/12/2019   04/03/2019-rheumatoid factor-95.4   Essential hypertension 05/03/2018   GAD (generalized anxiety disorder)    GERD without esophagitis 05/03/2018   Hematuria 09/07/2020   History of kidney stones    Hypertensive heart disease without heart failure 05/03/2018   Hyponatremia 08/12/2021   Idiopathic pulmonary fibrosis  (HCC) 03/29/2021   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   Insomnia 05/03/2018   Interstitial pulmonary disease (HCC) 04/12/2019   04/01/2019-CT chest with contrast- no acute process in chest abdomen or pelvis, interstitial lung disease suspicious for early or mild UIP, pulmonary artery enlargement suggest PAH  04/02/2019-connective tissue work-up: Anti-Jo 1-negative Anti-DNA antibody double-stranded-negative Anti-scleroderma antibody-negative Sjogren's syndrome antibody-negative Sjogren's syndrome antibody-negative CK-31 CCP-6 E   Leukocytosis 03/08/2020   Low vitamin B12 level 04/03/2019   Lower extremity edema    Medicare annual wellness visit, subsequent 08/09/2020   Microscopic polyangiitis (HCC) 03/29/2021   Mixed diabetic hyperlipidemia associated with type 2 diabetes mellitus (HCC) 08/12/2021   Mixed hyperlipidemia 05/03/2018   Myalgia 04/19/2019   Osteoarthritis    Other emphysema (HCC)    Other long term (current) drug therapy 03/29/2021   Peripheral polyneuropathy 01/30/2020   Polymyositis (HCC)    Primary insomnia    Proteinuria 09/07/2020   Renal insufficiency 09/06/2020   Respiratory disorder concurrent with and due to microscopic polyangiitis (HCC) 08/12/2021   Rheumatoid factor positive 03/29/2021   Sepsis (HCC) 04/19/2019   Skin cancer    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/17/2020   Transaminitis    Trigger finger 05/03/2018   Type 2 diabetes mellitus with hyperglycemia, with long-term current use of insulin (HCC) 04/23/2021   UC (ulcerative colitis) (HCC)    UTI (urinary tract infection)    Vasculitis (HCC) 03/29/2021    Family History  Problem Relation Age of Onset   Tuberculosis Mother    Stroke Father    Pancreatic cancer Sister    Heart attack Sister    Lung disease Sister    Clotting disorder Brother    Colon cancer Neg Hx    Esophageal cancer Neg Hx     Past Surgical History:  Procedure Laterality Date   ANGIOPLASTY  2010   BACK SURGERY  CATARACT EXTRACTION     COLONOSCOPY  06/16/2005   Mild colitis involving splenic flexure. Colonic polyps, status post polypectomy. Mild pancolonic diverticulitits. Internal hemorrhoids.    ERCP N/A 09/13/2022   Procedure: ENDOSCOPIC RETROGRADE CHOLANGIOPANCREATOGRAPHY (ERCP);  Surgeon: Iva Boop, MD;  Location: [facility];  Service: Gastroenterology;  Laterality: N/A;   ESOPHAGOGASTRODUODENOSCOPY  04/26/2003   Irregular Z line suggestive of GERD. Mild gastritis status post CLO testing.    REMOVAL OF STONES  09/13/2022   Procedure: REMOVAL OF STONES;  Surgeon: Iva Boop, MD;  Location: [facility];  Service: Gastroenterology;;   Dennison Mascot  09/13/2022   Procedure: Dennison Mascot;  Surgeon: Iva Boop, MD;  Location: [facility];  Service: Gastroenterology;;   TOTAL KNEE ARTHROPLASTY Left 12/17/2020   Procedure: LEFT TOTAL KNEE ARTHROPLASTY;  Surgeon: Tarry Kos, MD;  Location: [facility];  Service: Orthopedics;  Laterality: Left;   TRIGGER FINGER RELEASE     Social History   Occupational History   Occupation: retired  Tobacco Use   Smoking status: Never   Smokeless tobacco: Never  Vaping Use   Vaping Use: Never used  Substance and Sexual Activity   Alcohol use: Never   Drug use: Never   Sexual activity: Not on file

## 2022-12-22 DIAGNOSIS — L578 Other skin changes due to chronic exposure to nonionizing radiation: Secondary | ICD-10-CM | POA: Diagnosis not present

## 2022-12-22 DIAGNOSIS — L820 Inflamed seborrheic keratosis: Secondary | ICD-10-CM | POA: Diagnosis not present

## 2022-12-22 DIAGNOSIS — D0439 Carcinoma in situ of skin of other parts of face: Secondary | ICD-10-CM | POA: Diagnosis not present

## 2022-12-22 DIAGNOSIS — L570 Actinic keratosis: Secondary | ICD-10-CM | POA: Diagnosis not present

## 2022-12-22 DIAGNOSIS — L300 Nummular dermatitis: Secondary | ICD-10-CM | POA: Diagnosis not present

## 2022-12-30 DIAGNOSIS — M317 Microscopic polyangiitis: Secondary | ICD-10-CM | POA: Diagnosis not present

## 2023-01-01 ENCOUNTER — Ambulatory Visit
Admission: RE | Admit: 2023-01-01 | Discharge: 2023-01-01 | Disposition: A | Discharge status: Home / Self Care | Payer: Medicare HMO | Source: Ambulatory Visit | Attending: Pulmonary Disease | Admitting: Pulmonary Disease

## 2023-01-01 DIAGNOSIS — I700 Atherosclerosis of aorta: Secondary | ICD-10-CM | POA: Diagnosis not present

## 2023-01-01 DIAGNOSIS — J849 Interstitial pulmonary disease, unspecified: Secondary | ICD-10-CM | POA: Diagnosis not present

## 2023-01-07 ENCOUNTER — Other Ambulatory Visit: Payer: Self-pay

## 2023-01-12 ENCOUNTER — Other Ambulatory Visit: Payer: Self-pay | Admitting: Family Medicine

## 2023-01-13 ENCOUNTER — Encounter: Payer: Self-pay | Admitting: Cardiology

## 2023-01-13 ENCOUNTER — Ambulatory Visit: Payer: Medicare HMO | Attending: Cardiology | Admitting: Cardiology

## 2023-01-13 VITALS — BP 136/70 | HR 80 | Ht 66.0 in | Wt 172.6 lb

## 2023-01-13 DIAGNOSIS — I2510 Atherosclerotic heart disease of native coronary artery without angina pectoris: Secondary | ICD-10-CM

## 2023-01-13 DIAGNOSIS — E785 Hyperlipidemia, unspecified: Secondary | ICD-10-CM | POA: Diagnosis not present

## 2023-01-13 DIAGNOSIS — Z79899 Other long term (current) drug therapy: Secondary | ICD-10-CM | POA: Diagnosis not present

## 2023-01-13 DIAGNOSIS — M317 Microscopic polyangiitis: Secondary | ICD-10-CM | POA: Diagnosis not present

## 2023-01-13 DIAGNOSIS — E1169 Type 2 diabetes mellitus with other specified complication: Secondary | ICD-10-CM

## 2023-01-13 NOTE — Patient Instructions (Signed)
Medication Instructions:  Your physician recommends that you continue on your current medications as directed. Please refer to the Current Medication list given to you today.  *If you need a refill on your cardiac medications before your next appointment, please call your pharmacy*   Lab Work: None ordered If you have labs (blood work) drawn today and your tests are completely normal, you will receive your results only by: MyChart Message (if you have MyChart) OR A paper copy in the mail If you have any lab test that is abnormal or we need to change your treatment, we will call you to review the results.   Testing/Procedures: None ordered   Follow-Up: At [facility], you and your health needs are our priority.  As part of our continuing mission to provide you with exceptional heart care, we have created designated Provider Care Teams.  These Care Teams include your primary Cardiologist (physician) and Advanced Practice Providers (APPs -  Physician Assistants and Nurse Practitioners) who all work together to provide you with the care you need, when you need it.  We recommend signing up for the patient portal called "MyChart".  Sign up information is provided on this After Visit Summary.  MyChart is used to connect with patients for Virtual Visits (Telemedicine).  Patients are able to view lab/test results, encounter notes, upcoming appointments, etc.  Non-urgent messages can be sent to your provider as well.   To learn more about what you can do with MyChart, go to [email or web address].    Your next appointment:   12 month(s)  The format for your next appointment:   In Person  Provider:   Rajan Revankar, MD    Other Instructions none  Important Information About Sugar      

## 2023-01-13 NOTE — Progress Notes (Signed)
Cardiology Office Note:    Date:  01/13/2023   ID:  Kerry Matthews, DOB 1935-11-20, MRN [id number]  PCP:  Blane Ohara, MD  Cardiologist:  Garwin Brothers, MD   Referring MD: Blane Ohara, MD    ASSESSMENT:    1. Atherosclerosis of native coronary artery of native heart without angina pectoris   2. Dyslipidemia associated with type 2 diabetes mellitus (HCC)    PLAN:    In order of problems listed above:  Coronary artery disease: Stable and asymptomatic at this time.  Secondary prevention stressed.  Importance of compliance with diet medication stressed and he vocalized understanding.  He was advised to walk to the best of his ability on a regular basis. Mixed dyslipidemia: Not on statins because of intolerance.  He is taking PCSK9 injections and will follow-up with primary care on June 7 for follow-up blood work.  Goal LDL must be less than 60. Diet controlled diabetes mellitus.  Diet emphasized. Patient will be seen in follow-up appointment in 12 months or earlier if the patient has any concerns.    Medication Adjustments/Labs and Tests Ordered: Current medicines are reviewed at length with the patient today.  Concerns regarding medicines are outlined above.  No orders of the defined types were placed in this encounter.  No orders of the defined types were placed in this encounter.    No chief complaint on file.    History of Present Illness:    Kerry Matthews is a 87 y.o. male.  Patient has past medical history of coronary artery disease and aortic atherosclerosis.  He denies any problems at this time and takes care of activities of daily living.  No chest pain orthopnea or PND.  He has history of lung condition with scarring followed by pulmonary.  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  ANCA antibodies- myeloperoxidase ABS-greater than 100, ANCA proteinase 3-less than 3.5 04/02/2019- ANCA titers- p-ANCA +1: 160, C  ANCA-less than 1: 20, atypical p-ANCA titer-less than 1: 20   Abnormal CT of the chest    Abnormal findings on diagnostic imaging of lung 04/12/2019   04/01/2019-CT chest with contrast- no acute process in chest abdomen or pelvis, interstitial lung disease suspicious for early or mild UIP, pulmonary artery enlargement suggest PAH    Acquired trigger finger of left little finger 10/27/2022   Acute gallstone pancreatitis 09/12/2022   Adult BMI 27.0-27.9 kg/sq m 07/08/2022   Alpha-1-antitrypsin deficiency carrier 05/04/2019   Atherosclerotic heart disease of native coronary artery without angina pectoris 05/03/2018   Body mass index (BMI) 29.0-29.9, adult 01/05/2020   Choledocholithiasis 09/11/2022   Chronic kidney disease, stage 3a (HCC) 08/12/2021   Chronic respiratory failure with hypoxia (HCC) 03/06/2022   CKD (chronic kidney disease) 05/03/2018   Coronary artery disease involving native coronary artery of native heart without angina pectoris 05/03/2018   Diabetes mellitus type 2 in obese 04/01/2019   Diabetic polyneuropathy associated with type 2 diabetes mellitus (HCC) 08/12/2021   Dyslipidemia associated with type 2 diabetes mellitus (HCC) 05/03/2018   Dysuria 09/07/2020   Elevated liver enzymes 09/19/2022   Elevated rheumatoid factor 04/12/2019   04/03/2019-rheumatoid factor-95.4   GAD (generalized anxiety disorder)    GERD without esophagitis 05/03/2018   Gouty arthritis of left great toe 07/08/2022   History of kidney stones    Hypertensive heart disease without heart failure 05/03/2018   Hyponatremia 08/12/2021   Idiopathic pulmonary fibrosis (HCC) 03/29/2021  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   Interstitial pulmonary disease (HCC) 04/12/2019   04/01/2019-CT chest with contrast- no acute process in chest abdomen or pelvis, interstitial lung disease suspicious for early or mild UIP, pulmonary artery enlargement suggest PAH  04/02/2019-connective tissue work-up: Anti-Jo  1-negative Anti-DNA antibody double-stranded-negative Anti-scleroderma antibody-negative Sjogren's syndrome antibody-negative Sjogren's syndrome antibody-negative CK-31 CCP-6 E   Leukocytosis 03/08/2020   Low vitamin B12 level 04/03/2019   Medicare annual wellness visit, subsequent 08/09/2020   Microscopic polyangiitis (HCC) 03/29/2021   Mixed diabetic hyperlipidemia associated with type 2 diabetes mellitus (HCC) 08/12/2021   Mixed hyperlipidemia 05/03/2018   Osteoarthritis    Other emphysema (HCC)    Other long term (current) drug therapy 03/29/2021   Pain in finger of left hand 11/17/2022   Peripheral polyneuropathy 01/30/2020   Pneumonia of left lower lobe due to infectious organism 10/05/2022   Polymyositis (HCC)    Recurrent UTI    Respiratory disorder concurrent with and due to microscopic polyangiitis (HCC) 08/12/2021   Rheumatoid factor positive 03/29/2021   SIRS (systemic inflammatory response syndrome) (HCC) 09/11/2022   Skin cancer    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/17/2020   Swollen L ankle 09/19/2022   Transaminitis    Type 2 diabetes mellitus with hyperglycemia, with long-term current use of insulin (HCC) 04/23/2021   UC (ulcerative colitis) (HCC)    Urinary frequency 09/19/2022   Vasculitis (HCC) 03/29/2021   Vitamin D deficiency 10/21/2022    Past Surgical History:  Procedure Laterality Date   ANGIOPLASTY  2010   BACK SURGERY     CATARACT EXTRACTION     COLONOSCOPY  06/16/2005   Mild colitis involving splenic flexure. Colonic polyps, status post polypectomy. Mild pancolonic diverticulitits. Internal hemorrhoids.    ERCP N/A 09/13/2022   Procedure: ENDOSCOPIC RETROGRADE CHOLANGIOPANCREATOGRAPHY (ERCP);  Surgeon: Iva Boop, MD;  Location: [facility];  Service: Gastroenterology;  Laterality: N/A;   ESOPHAGOGASTRODUODENOSCOPY  04/26/2003   Irregular Z line suggestive of GERD. Mild gastritis status post CLO testing.    REMOVAL OF STONES  09/13/2022    Procedure: REMOVAL OF STONES;  Surgeon: Iva Boop, MD;  Location: [facility];  Service: Gastroenterology;;   Dennison Mascot  09/13/2022   Procedure: Dennison Mascot;  Surgeon: Iva Boop, MD;  Location: [facility];  Service: Gastroenterology;;   TOTAL KNEE ARTHROPLASTY Left 12/17/2020   Procedure: LEFT TOTAL KNEE ARTHROPLASTY;  Surgeon: Tarry Kos, MD;  Location: [facility];  Service: Orthopedics;  Laterality: Left;   TRIGGER FINGER RELEASE      Current Medications: Current Meds  Medication Sig   aspirin EC 81 MG tablet Take 1 tablet (81 mg total) by mouth daily. Swallow whole.   Emollient (LUBRIDERM SERIOUSLY SENSITIVE) LOTN Apply 1 Application topically as needed (itching).   Evolocumab (REPATHA) 140 MG/ML SOSY Inject 140 mg into the skin every 14 (fourteen) days.   furosemide (LASIX) 20 MG tablet TAKE 1 TABLET EVERY DAY AS NEEDED   gabapentin (NEURONTIN) 400 MG capsule TAKE 1 CAPSULE THREE TIMES DAILY   glucose blood (TRUE METRIX BLOOD GLUCOSE TEST) test strip TEST BLOOD SUGAR THREE TIMES DAILY BEFORE MEALS   Multiple Vitamins-Minerals (PRESERVISION AREDS 2) CAPS Take 1 capsule by mouth 2 (two) times daily after a meal.   nitroGLYCERIN (NITROSTAT) 0.4 MG SL tablet Place 1 tablet (0.4 mg total) under the tongue every 5 (five) minutes as needed for chest pain.   omeprazole (PRILOSEC) 20 MG capsule TAKE 1 CAPSULE EVERY DAY   polyethylene glycol (  MIRALAX / GLYCOLAX) 17 g packet Take 17 g by mouth daily.   riTUXimab (RITUXAN) 500 MG/50ML injection Inject into the vein every 6 (six) months.   vitamin B-12 (CYANOCOBALAMIN) 1000 MCG tablet Take 1 tablet (1,000 mcg total) by mouth daily.     Allergies:   Ace inhibitors, Hydrocodone, Hydrocodone-acetaminophen, Nsaids, Sulfamethoxazole, Sulfamethoxazole-trimethoprim, and Trimethoprim   Social History   Socioeconomic History   Marital status: Married    Spouse name: Not on file   Number of children: 2   Years of education: Not  on file   Highest education level: Not on file  Occupational History   Occupation: retired  Tobacco Use   Smoking status: Never   Smokeless tobacco: Never  Vaping Use   Vaping Use: Never used  Substance and Sexual Activity   Alcohol use: Never   Drug use: Never   Sexual activity: Not on file  Other Topics Concern   Not on file  Social History Narrative   Not on file   Social Determinants of Health   Financial Resource Strain: Low Risk  (05/07/2022)   Overall Financial Resource Strain (CARDIA)    Difficulty of Paying Living Expenses: Not hard at all  Food Insecurity: No Food Insecurity (09/16/2022)   Hunger Vital Sign    Worried About Running Out of Food in the Last Year: Never true    Ran Out of Food in the Last Year: Never true  Transportation Needs: No Transportation Needs (09/16/2022)   PRAPARE - Administrator, Civil Service (Medical): No    Lack of Transportation (Non-Medical): No  Physical Activity: Sufficiently Active (06/07/2020)   Exercise Vital Sign    Days of Exercise per Week: 5 days    Minutes of Exercise per Session: 30 min  Stress: Not on file  Social Connections: Not on file     Family History: The patient's family history includes Clotting disorder in his brother; Heart attack in his sister; Lung disease in his sister; Pancreatic cancer in his sister; Stroke in his father; Tuberculosis in his mother. There is no history of Colon cancer or Esophageal cancer.  ROS:   Please see the history of present illness.    All other systems reviewed and are negative.  EKGs/Labs/Other Studies Reviewed:    The following studies were reviewed today: I discussed my findings with the patient at length.   Recent Labs: 09/11/2022: TSH 2.028 09/14/2022: Magnesium 2.0 10/21/2022: ALT 15; BUN 18; Creatinine, Ser 1.10; Hemoglobin 15.3; Platelets 279; Potassium 4.3; Sodium 140  Recent Lipid Panel    Component Value Date/Time   CHOL 271 (H) 10/21/2022 0849    TRIG 153 (H) 10/21/2022 0849   HDL 39 (L) 10/21/2022 0849   CHOLHDL 6.9 (H) 10/21/2022 0849   LDLCALC 203 (H) 10/21/2022 0849    Physical Exam:    VS:  BP 136/70   Pulse 80   Ht 5\' 6"  (1.676 m)   Wt 172 lb 9.6 oz (78.3 kg)   SpO2 94%   BMI 27.86 kg/m     Wt Readings from Last 3 Encounters:  01/13/23 172 lb 9.6 oz (78.3 kg)  10/21/22 172 lb (78 kg)  09/22/22 172 lb (78 kg)     GEN: Patient is in no acute distress HEENT: Normal NECK: No JVD; No carotid bruits LYMPHATICS: No lymphadenopathy CARDIAC: Hear sounds regular, 2/6 systolic murmur at the apex. RESPIRATORY:  Clear to auscultation without rales, wheezing or rhonchi  ABDOMEN: Soft, non-tender, non-distended MUSCULOSKELETAL:  No edema; No deformity  SKIN: Warm and dry NEUROLOGIC:  Alert and oriented x 3 PSYCHIATRIC:  Normal affect   Signed, Garwin Brothers, MD  01/13/2023 1:57 PM    [facility]

## 2023-01-14 ENCOUNTER — Ambulatory Visit: Payer: Medicare HMO | Admitting: Podiatry

## 2023-01-14 DIAGNOSIS — B351 Tinea unguium: Secondary | ICD-10-CM

## 2023-01-14 DIAGNOSIS — M79674 Pain in right toe(s): Secondary | ICD-10-CM | POA: Diagnosis not present

## 2023-01-14 DIAGNOSIS — M79675 Pain in left toe(s): Secondary | ICD-10-CM

## 2023-01-15 NOTE — Progress Notes (Signed)
       Subjective:  Patient ID: Kerry Matthews, male    DOB: 06/15/1936,  MRN: [id number]   Kerry Matthews presents to clinic today for:  Chief Complaint  Patient presents with   Nail Problem    Diabetic Foot Care   PCP- Kirsten Cox-MD Last Visit- A couple months ago.   . Patient notes nails are thick, discolored, elongated and painful in shoegear when trying to ambulate.    PCP is Cox, Fritzi Mandes, MD:   Last seen 10/22/22  Allergies  Allergen Reactions   Ace Inhibitors Other (See Comments)    = Slow heart rate   Hydrocodone Nausea And Vomiting   Hydrocodone-Acetaminophen Nausea And Vomiting   Nsaids Other (See Comments)    Kidney issues   Sulfamethoxazole Nausea And Vomiting   Sulfamethoxazole-Trimethoprim Nausea And Vomiting   Trimethoprim Other (See Comments)    Reaction not recalled    Review of Systems: Negative except as noted in the HPI.  Objective:  There were no vitals filed for this visit.  Kerry Matthews is a pleasant 87 y.o. male in NAD. AAO x 3.  Vascular Examination: Capillary refill time is 3-5 seconds to toes bilateral. Palpable pedal pulses b/l LE. Digital hair present b/l. No pedal edema b/l. Skin temperature gradient WNL b/l. No varicosities b/l. No cyanosis or clubbing noted b/l.   Dermatological Examination: Pedal skin with normal turgor, texture and tone b/l. No open wounds. No interdigital macerations b/l. Toenails x10 are 3mm thick, discolored, dystrophic with subungual debris. There is pain with compression of the nail plates.  They are elongated x10  Neurological Examination: Protective sensation intact bilateral LE. Vibratory sensation intact bilateral LE.  Musculoskeletal Examination: Muscle strength 5/5 to all LE muscle groups b/l.      Latest Ref Rng & Units 10/21/2022    8:49 AM 07/08/2022    9:10 AM 03/31/2022    8:44 AM  Hemoglobin A1C  Hemoglobin-A1c 4.8 - 5.6 % 6.2  6.1  6.6     Assessment/Plan: 1. Pain due to onychomycosis of  toenails of both feet     The mycotic toenails were sharply debrided x10 with sterile nail nippers and a power debriding burr to decrease bulk/thickness and length.    Return in about 3 months (around 04/16/2023) for [facility] / nail trim.   Clerance Lav, DPM, FACFAS [facility]     [address] [place], [place] [address]                Office [phone]  Fax [phone]

## 2023-01-18 ENCOUNTER — Other Ambulatory Visit: Payer: Self-pay | Admitting: Family Medicine

## 2023-01-20 ENCOUNTER — Ambulatory Visit (INDEPENDENT_AMBULATORY_CARE_PROVIDER_SITE_OTHER): Payer: Medicare HMO | Admitting: Family Medicine

## 2023-01-20 VITALS — BP 160/70 | HR 64 | Temp 97.0°F | Resp 18 | Ht 65.0 in | Wt 173.2 lb

## 2023-01-20 DIAGNOSIS — M545 Low back pain, unspecified: Secondary | ICD-10-CM | POA: Diagnosis not present

## 2023-01-20 DIAGNOSIS — R300 Dysuria: Secondary | ICD-10-CM | POA: Diagnosis not present

## 2023-01-20 DIAGNOSIS — M47816 Spondylosis without myelopathy or radiculopathy, lumbar region: Secondary | ICD-10-CM | POA: Diagnosis not present

## 2023-01-20 LAB — POCT URINALYSIS DIP (CLINITEK)
Bilirubin, UA: NEGATIVE
Blood, UA: NEGATIVE
Glucose, UA: NEGATIVE mg/dL
Ketones, POC UA: NEGATIVE mg/dL
Leukocytes, UA: NEGATIVE
Nitrite, UA: NEGATIVE
POC PROTEIN,UA: NEGATIVE
Spec Grav, UA: 1.015
Urobilinogen, UA: 0.2 U/dL
pH, UA: 6

## 2023-01-20 MED ORDER — CIPROFLOXACIN HCL 250 MG PO TABS: 250.0000 mg | ORAL_TABLET | Freq: Two times a day (BID) | ORAL | 0 refills | Status: DC

## 2023-01-20 MED ORDER — TRAMADOL HCL 50 MG PO TABS: 50.0000 mg | ORAL_TABLET | Freq: Two times a day (BID) | ORAL | 0 refills | Status: AC | PRN

## 2023-01-20 NOTE — Patient Instructions (Addendum)
Cipro 250 mg twice daily for total of 7 dats.   Ice or heat as needed for back pain.   Has muscle relaxant at home. Restart at twice daily.   Tylenol twice daily with muscle relaxant.   Start tramadol 50 mg twice daily as needed severe back pain.  Xrays ordered.

## 2023-01-20 NOTE — Assessment & Plan Note (Signed)
Cipro 250 mg twice daily for 7 days. Drink plently of fluids.

## 2023-01-20 NOTE — Assessment & Plan Note (Signed)
X-rays ordered. Ice/heat as needed. Tylenol twice daily as needed for pain. Restart flexeril 5 mg twice daily as needed for pain.   Tramadol as needed for severe pain.   No climbing ladders.

## 2023-01-20 NOTE — Progress Notes (Unsigned)
Acute Office Visit  Subjective:    Patient ID: Kerry Matthews, male    DOB: 13-Sep-1935, 87 y.o.   MRN: [id number]  Chief Complaint  Patient presents with   Back Pain   Dysuria    HPI: Patient is in today for low back pain and discomfort with urination.  He reports climbing a ladder to clean a tank gas tank on Saturday which required that he reach and stretch his arms overhead.  He also reports that he  was up and down the ladder several times.  His symptoms started on Sunday with mid back pain and discomfort with urination.  He started cipro on Sunday after talking with on call provider.  His blood sugar range has been 94-108.    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  ANCA antibodies- myeloperoxidase ABS-greater than 100, ANCA proteinase 3-less than 3.5 04/02/2019- ANCA titers- p-ANCA +1: 160, C ANCA-less than 1: 20, atypical p-ANCA titer-less than 1: 20   Abnormal CT of the chest    Abnormal findings on diagnostic imaging of lung 04/12/2019   04/01/2019-CT chest with contrast- no acute process in chest abdomen or pelvis, interstitial lung disease suspicious for early or mild UIP, pulmonary artery enlargement suggest PAH    Acquired trigger finger of left little finger 10/27/2022   Acute gallstone pancreatitis 09/12/2022   Adult BMI 27.0-27.9 kg/sq m 07/08/2022   Alpha-1-antitrypsin deficiency carrier 05/04/2019   Atherosclerotic heart disease of native coronary artery without angina pectoris 05/03/2018   Body mass index (BMI) 29.0-29.9, adult 01/05/2020   Choledocholithiasis 09/11/2022   Chronic kidney disease, stage 3a (HCC) 08/12/2021   Chronic respiratory failure with hypoxia (HCC) 03/06/2022   CKD (chronic kidney disease) 05/03/2018   Coronary artery disease involving native coronary artery of native heart without angina pectoris 05/03/2018   Diabetes mellitus type 2 in obese 04/01/2019   Diabetic polyneuropathy associated with  type 2 diabetes mellitus (HCC) 08/12/2021   Dyslipidemia associated with type 2 diabetes mellitus (HCC) 05/03/2018   Dysuria 09/07/2020   Elevated liver enzymes 09/19/2022   Elevated rheumatoid factor 04/12/2019   04/03/2019-rheumatoid factor-95.4   GAD (generalized anxiety disorder)    GERD without esophagitis 05/03/2018   Gouty arthritis of left great toe 07/08/2022   History of kidney stones    Hypertensive heart disease without heart failure 05/03/2018   Hyponatremia 08/12/2021   Idiopathic pulmonary fibrosis (HCC) 03/29/2021   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   Interstitial pulmonary disease (HCC) 04/12/2019   04/01/2019-CT chest with contrast- no acute process in chest abdomen or pelvis, interstitial lung disease suspicious for early or mild UIP, pulmonary artery enlargement suggest PAH  04/02/2019-connective tissue work-up: Anti-Jo 1-negative Anti-DNA antibody double-stranded-negative Anti-scleroderma antibody-negative Sjogren's syndrome antibody-negative Sjogren's syndrome antibody-negative CK-31 CCP-6 E   Leukocytosis 03/08/2020   Low vitamin B12 level 04/03/2019   Medicare annual wellness visit, subsequent 08/09/2020   Microscopic polyangiitis (HCC) 03/29/2021   Mixed diabetic hyperlipidemia associated with type 2 diabetes mellitus (HCC) 08/12/2021   Mixed hyperlipidemia 05/03/2018   Osteoarthritis    Other emphysema (HCC)    Other long term (current) drug therapy 03/29/2021   Pain in finger of left hand 11/17/2022   Peripheral polyneuropathy 01/30/2020   Pneumonia of left lower lobe due to infectious organism 10/05/2022   Polymyositis (HCC)    Recurrent UTI    Respiratory disorder concurrent with and due to microscopic polyangiitis (HCC) 08/12/2021   Rheumatoid factor positive 03/29/2021  SIRS (systemic inflammatory response syndrome) (HCC) 09/11/2022   Skin cancer    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/17/2020   Swollen L ankle 09/19/2022   Transaminitis     Type 2 diabetes mellitus with hyperglycemia, with long-term current use of insulin (HCC) 04/23/2021   UC (ulcerative colitis) (HCC)    Urinary frequency 09/19/2022   Vasculitis (HCC) 03/29/2021   Vitamin D deficiency 10/21/2022    Past Surgical History:  Procedure Laterality Date   ANGIOPLASTY  2010   BACK SURGERY     CATARACT EXTRACTION     COLONOSCOPY  06/16/2005   Mild colitis involving splenic flexure. Colonic polyps, status post polypectomy. Mild pancolonic diverticulitits. Internal hemorrhoids.    ERCP N/A 09/13/2022   Procedure: ENDOSCOPIC RETROGRADE CHOLANGIOPANCREATOGRAPHY (ERCP);  Surgeon: Iva Boop, MD;  Location: [facility];  Service: Gastroenterology;  Laterality: N/A;   ESOPHAGOGASTRODUODENOSCOPY  04/26/2003   Irregular Z line suggestive of GERD. Mild gastritis status post CLO testing.    REMOVAL OF STONES  09/13/2022   Procedure: REMOVAL OF STONES;  Surgeon: Iva Boop, MD;  Location: [facility];  Service: Gastroenterology;;   Dennison Mascot  09/13/2022   Procedure: Dennison Mascot;  Surgeon: Iva Boop, MD;  Location: [facility];  Service: Gastroenterology;;   TOTAL KNEE ARTHROPLASTY Left 12/17/2020   Procedure: LEFT TOTAL KNEE ARTHROPLASTY;  Surgeon: Tarry Kos, MD;  Location: [facility];  Service: Orthopedics;  Laterality: Left;   TRIGGER FINGER RELEASE      Family History  Problem Relation Age of Onset   Tuberculosis Mother    Stroke Father    Pancreatic cancer Sister    Heart attack Sister    Lung disease Sister    Clotting disorder Brother    Colon cancer Neg Hx    Esophageal cancer Neg Hx     Social History   Socioeconomic History   Marital status: Married    Spouse name: Not on file   Number of children: 2   Years of education: Not on file   Highest education level: 8th grade  Occupational History   Occupation: retired  Tobacco Use   Smoking status: Never   Smokeless tobacco: Never  Vaping Use   Vaping Use: Never used   Substance and Sexual Activity   Alcohol use: Never   Drug use: Never   Sexual activity: Not on file  Other Topics Concern   Not on file  Social History Narrative   Not on file   Social Determinants of Health   Financial Resource Strain: Low Risk  (01/20/2023)   Overall Financial Resource Strain (CARDIA)    Difficulty of Paying Living Expenses: Not hard at all  Food Insecurity: No Food Insecurity (01/20/2023)   Hunger Vital Sign    Worried About Running Out of Food in the Last Year: Never true    Ran Out of Food in the Last Year: Never true  Transportation Needs: No Transportation Needs (01/20/2023)   PRAPARE - Administrator, Civil Service (Medical): No    Lack of Transportation (Non-Medical): No  Physical Activity: Unknown (01/20/2023)   Exercise Vital Sign    Days of Exercise per Week: Patient declined    Minutes of Exercise per Session: Not on file  Stress: Stress Concern Present (01/20/2023)   [employer] of Occupational Health - Occupational Stress Questionnaire    Feeling of Stress : To some extent  Social Connections: Unknown (01/20/2023)   Social Connection and Isolation Panel [NHANES]  Frequency of Communication with Friends and Family: More than three times a week    Frequency of Social Gatherings with Friends and Family: Patient declined    Attends Religious Services: More than 4 times per year    Active Member of [employer] or Organizations: Patient declined    Attends Banker Meetings: Not on file    Marital Status: Married  Intimate Partner Violence: Not At Risk (04/08/2022)   Humiliation, Afraid, Rape, and Kick questionnaire    Fear of Current or Ex-Partner: No    Emotionally Abused: No    Physically Abused: No    Sexually Abused: No    Outpatient Medications Prior to Visit  Medication Sig Dispense Refill   aspirin EC 81 MG tablet Take 1 tablet (81 mg total) by mouth daily. Swallow whole. 30 tablet 11   Emollient (LUBRIDERM  SERIOUSLY SENSITIVE) LOTN Apply 1 Application topically as needed (itching).  0   Evolocumab (REPATHA) 140 MG/ML SOSY Inject 140 mg into the skin every 14 (fourteen) days. 2.1 mL 2   furosemide (LASIX) 20 MG tablet TAKE 1 TABLET EVERY DAY AS NEEDED 90 tablet 1   gabapentin (NEURONTIN) 400 MG capsule TAKE 1 CAPSULE THREE TIMES DAILY 270 capsule 1   glucose blood (TRUE METRIX BLOOD GLUCOSE TEST) test strip TEST BLOOD SUGAR THREE TIMES DAILY BEFORE MEALS 300 strip 3   Multiple Vitamins-Minerals (PRESERVISION AREDS 2) CAPS Take 1 capsule by mouth 2 (two) times daily after a meal.     nitroGLYCERIN (NITROSTAT) 0.4 MG SL tablet Place 1 tablet (0.4 mg total) under the tongue every 5 (five) minutes as needed for chest pain. 25 tablet 9   omeprazole (PRILOSEC) 20 MG capsule TAKE 1 CAPSULE EVERY DAY 90 capsule 1   polyethylene glycol (MIRALAX / GLYCOLAX) 17 g packet Take 17 g by mouth daily.     riTUXimab (RITUXAN) 500 MG/50ML injection Inject into the vein every 6 (six) months.     vitamin B-12 (CYANOCOBALAMIN) 1000 MCG tablet Take 1 tablet (1,000 mcg total) by mouth daily. 30 tablet 0   No facility-administered medications prior to visit.    Allergies  Allergen Reactions   Ace Inhibitors Other (See Comments)    = Slow heart rate   Hydrocodone Nausea And Vomiting   Hydrocodone-Acetaminophen Nausea And Vomiting   Nsaids Other (See Comments)    Kidney issues   Sulfamethoxazole Nausea And Vomiting   Sulfamethoxazole-Trimethoprim Nausea And Vomiting   Trimethoprim Other (See Comments)    Reaction not recalled    Review of Systems  Constitutional:  Negative for chills and fever.  HENT:  Negative for congestion, rhinorrhea and sore throat.   Respiratory:  Negative for cough and shortness of breath.   Cardiovascular:  Negative for chest pain and palpitations.  Gastrointestinal:  Negative for abdominal pain, constipation, diarrhea, nausea and vomiting.  Genitourinary:  Positive for dysuria.  Negative for urgency.  Musculoskeletal:  Positive for back pain. Negative for arthralgias and myalgias.  Neurological:  Negative for dizziness and headaches.       "Quivering sensation left neck and left shoulder".   Psychiatric/Behavioral:  Negative for dysphoric mood. The patient is not nervous/anxious.        Objective:        01/20/2023    2:53 PM 01/13/2023    1:44 PM 10/21/2022    9:05 AM  Vitals with BMI  Height 5\' 5"  5\' 6"  5\' 6"   Weight 173 lbs 3 oz 172 lbs 10 oz  172 lbs  BMI 28.82 27.87 27.77  Systolic 160 136 829  Diastolic 70 70 74  Pulse 64 80 58    No data found.   Physical Exam Vitals reviewed.  Constitutional:      Appearance: Normal appearance.  Neck:     Vascular: No carotid bruit.  Cardiovascular:     Rate and Rhythm: Normal rate and regular rhythm.     Heart sounds: Normal heart sounds.  Pulmonary:     Effort: Pulmonary effort is normal.     Breath sounds: Normal breath sounds. No wheezing, rhonchi or rales.  Abdominal:     General: Bowel sounds are normal.     Palpations: Abdomen is soft.     Tenderness: There is no abdominal tenderness.  Musculoskeletal:        General: Tenderness (lumbar paraspinal muscles.) present.     Comments: Negative SLR BL.   Neurological:     Mental Status: He is alert and oriented to person, place, and time.  Psychiatric:        Mood and Affect: Mood normal.        Behavior: Behavior normal.     Health Maintenance Due  Topic Date Due   DTaP/Tdap/Td (1 - Tdap) Never done   Zoster Vaccines- Shingrix (1 of 2) Never done   COVID-19 Vaccine (5 - 2023-24 season) 07/24/2022    There are no preventive care reminders to display for this patient.   Lab Results  Component Value Date   TSH 2.028 09/11/2022   Lab Results  Component Value Date   WBC 11.3 (H) 10/21/2022   HGB 15.3 10/21/2022   HCT 46.1 10/21/2022   MCV 88 10/21/2022   PLT 279 10/21/2022   Lab Results  Component Value Date   NA 140 10/21/2022    K 4.3 10/21/2022   CO2 21 10/21/2022   GLUCOSE 113 (H) 10/21/2022   BUN 18 10/21/2022   CREATININE 1.10 10/21/2022   BILITOT 0.8 10/21/2022   ALKPHOS 172 (H) 10/21/2022   AST 24 10/21/2022   ALT 15 10/21/2022   PROT 6.5 10/21/2022   ALBUMIN 4.3 10/21/2022   CALCIUM 9.2 10/21/2022   ANIONGAP 5 09/15/2022   EGFR 66 10/21/2022   GFR 40.27 (L) 02/19/2022   Lab Results  Component Value Date   CHOL 271 (H) 10/21/2022   Lab Results  Component Value Date   HDL 39 (L) 10/21/2022   Lab Results  Component Value Date   LDLCALC 203 (H) 10/21/2022   Lab Results  Component Value Date   TRIG 153 (H) 10/21/2022   Lab Results  Component Value Date   CHOLHDL 6.9 (H) 10/21/2022   Lab Results  Component Value Date   HGBA1C 6.2 (H) 10/21/2022       Assessment & Plan:  Dysuria Assessment & Plan: Cipro 250 mg twice daily for 7 days. Drink plently of fluids.  Orders: -     POCT URINALYSIS DIP (CLINITEK)  Acute bilateral low back pain without sciatica Assessment & Plan: X-rays ordered. Ice/heat as needed. Tylenol twice daily as needed for pain. Restart flexeril 5 mg twice daily as needed for pain.   Tramadol as needed for severe pain.   No climbing ladders.    Orders: -     DG Lumbar Spine Complete; Future  Other orders -     Ciprofloxacin HCl; Take 1 tablet (250 mg total) by mouth 2 (two) times daily for 14 days.  Dispense: 6 tablet; Refill: 0 -  traMADol HCl; Take 1 tablet (50 mg total) by mouth 2 (two) times daily as needed for up to 5 days for severe pain.  Dispense: 10 tablet; Refill: 0     Meds ordered this encounter  Medications   ciprofloxacin (CIPRO) 250 MG tablet    Sig: Take 1 tablet (250 mg total) by mouth 2 (two) times daily for 14 days.    Dispense:  6 tablet    Refill:  0   traMADol (ULTRAM) 50 MG tablet    Sig: Take 1 tablet (50 mg total) by mouth 2 (two) times daily as needed for up to 5 days for severe pain.    Dispense:  10 tablet     Refill:  0    Orders Placed This Encounter  Procedures   DG Lumbar Spine Complete   POCT URINALYSIS DIP (CLINITEK)     Follow-up: No follow-ups on file.  An After Visit Summary was printed and given to the patient.  Blane Ohara, MD [facility] [phone]

## 2023-01-21 ENCOUNTER — Encounter: Payer: Self-pay | Admitting: Family Medicine

## 2023-01-23 ENCOUNTER — Telehealth: Payer: Self-pay

## 2023-01-23 ENCOUNTER — Other Ambulatory Visit: Payer: Self-pay

## 2023-01-23 ENCOUNTER — Emergency Department (HOSPITAL_BASED_OUTPATIENT_CLINIC_OR_DEPARTMENT_OTHER)
Admission: EM | Admit: 2023-01-23 | Discharge: 2023-01-24 | Disposition: A | Discharge status: Home / Self Care | Payer: Medicare HMO | Attending: Emergency Medicine | Admitting: Emergency Medicine

## 2023-01-23 ENCOUNTER — Emergency Department (HOSPITAL_BASED_OUTPATIENT_CLINIC_OR_DEPARTMENT_OTHER): Payer: Medicare HMO

## 2023-01-23 DIAGNOSIS — I2510 Atherosclerotic heart disease of native coronary artery without angina pectoris: Secondary | ICD-10-CM | POA: Diagnosis not present

## 2023-01-23 DIAGNOSIS — N1831 Chronic kidney disease, stage 3a: Secondary | ICD-10-CM | POA: Insufficient documentation

## 2023-01-23 DIAGNOSIS — E1142 Type 2 diabetes mellitus with diabetic polyneuropathy: Secondary | ICD-10-CM | POA: Diagnosis not present

## 2023-01-23 DIAGNOSIS — Z96652 Presence of left artificial knee joint: Secondary | ICD-10-CM | POA: Insufficient documentation

## 2023-01-23 DIAGNOSIS — K4090 Unilateral inguinal hernia, without obstruction or gangrene, not specified as recurrent: Secondary | ICD-10-CM | POA: Diagnosis not present

## 2023-01-23 DIAGNOSIS — R1084 Generalized abdominal pain: Secondary | ICD-10-CM | POA: Diagnosis present

## 2023-01-23 DIAGNOSIS — M545 Low back pain, unspecified: Secondary | ICD-10-CM

## 2023-01-23 DIAGNOSIS — Z85828 Personal history of other malignant neoplasm of skin: Secondary | ICD-10-CM | POA: Insufficient documentation

## 2023-01-23 DIAGNOSIS — K5900 Constipation, unspecified: Secondary | ICD-10-CM | POA: Insufficient documentation

## 2023-01-23 DIAGNOSIS — Z7982 Long term (current) use of aspirin: Secondary | ICD-10-CM | POA: Insufficient documentation

## 2023-01-23 DIAGNOSIS — Z7984 Long term (current) use of oral hypoglycemic drugs: Secondary | ICD-10-CM | POA: Diagnosis not present

## 2023-01-23 DIAGNOSIS — R109 Unspecified abdominal pain: Secondary | ICD-10-CM | POA: Diagnosis not present

## 2023-01-23 DIAGNOSIS — I129 Hypertensive chronic kidney disease with stage 1 through stage 4 chronic kidney disease, or unspecified chronic kidney disease: Secondary | ICD-10-CM | POA: Insufficient documentation

## 2023-01-23 DIAGNOSIS — E1122 Type 2 diabetes mellitus with diabetic chronic kidney disease: Secondary | ICD-10-CM | POA: Diagnosis not present

## 2023-01-23 DIAGNOSIS — K5730 Diverticulosis of large intestine without perforation or abscess without bleeding: Secondary | ICD-10-CM | POA: Diagnosis not present

## 2023-01-23 LAB — COMPREHENSIVE METABOLIC PANEL
ALT: 16 U/L (ref 0–44)
AST: 23 U/L (ref 15–41)
Albumin: 4.2 g/dL (ref 3.5–5.0)
Alkaline Phosphatase: 174 U/L — ABNORMAL HIGH (ref 38–126)
Anion gap: 11 (ref 5–15)
BUN: 16 mg/dL (ref 8–23)
CO2: 27 mmol/L (ref 22–32)
Calcium: 9 mg/dL (ref 8.9–10.3)
Chloride: 95 mmol/L — ABNORMAL LOW (ref 98–111)
Creatinine, Ser: 1.07 mg/dL (ref 0.61–1.24)
GFR, Estimated: >60 mL/min (ref >60)
Glucose, Bld: 155 mg/dL — ABNORMAL HIGH (ref 70–99)
Potassium: 4.6 mmol/L (ref 3.5–5.1)
Sodium: 133 mmol/L — ABNORMAL LOW (ref 135–145)
Total Bilirubin: 0.7 mg/dL (ref 0.3–1.2)
Total Protein: 7.9 g/dL (ref 6.5–8.1)

## 2023-01-23 LAB — CBC WITH DIFFERENTIAL/PLATELET
Abs Immature Granulocytes: 0.05 10*3/uL (ref 0.00–0.07)
Basophils Absolute: 0.1 10*3/uL (ref 0.0–0.1)
Basophils Relative: 1 %
Eosinophils Absolute: 0.7 10*3/uL — ABNORMAL HIGH (ref 0.0–0.5)
Eosinophils Relative: 5 %
HCT: 48.7 % (ref 39.0–52.0)
Hemoglobin: 16.2 g/dL (ref 13.0–17.0)
Immature Granulocytes: 0 %
Lymphocytes Relative: 22 %
Lymphs Abs: 3.2 10*3/uL (ref 0.7–4.0)
MCH: 28.7 pg (ref 26.0–34.0)
MCHC: 33.3 g/dL (ref 30.0–36.0)
MCV: 86.3 fL (ref 80.0–100.0)
Monocytes Absolute: 1.2 10*3/uL — ABNORMAL HIGH (ref 0.1–1.0)
Monocytes Relative: 8 %
Neutro Abs: 9 10*3/uL — ABNORMAL HIGH (ref 1.7–7.7)
Neutrophils Relative %: 64 %
Platelets: 350 10*3/uL (ref 150–400)
RBC: 5.64 MIL/uL (ref 4.22–5.81)
RDW: 13.3 % (ref 11.5–15.5)
WBC: 14.2 10*3/uL — ABNORMAL HIGH (ref 4.0–10.5)
nRBC: 0 % (ref 0.0–0.2)

## 2023-01-23 NOTE — Telephone Encounter (Signed)
Patient's wife called and stated that the patient has not had a bowel movement since last Saturday. She states she doesn't know what to do please advise.

## 2023-01-23 NOTE — ED Triage Notes (Signed)
Patient here with constipation, has not had a BM since last Saturday.  He has tried Miralax, Mg Citrate and prune juice with no results. Patient having back pain with it.

## 2023-01-23 NOTE — Telephone Encounter (Signed)
Patient's wife was notified of lumbar spine results. Instructions per Dr. Sedalia Muta (Magnesium citrate 4 oz now and repeat in 4 hours if no results).

## 2023-01-24 ENCOUNTER — Encounter (HOSPITAL_BASED_OUTPATIENT_CLINIC_OR_DEPARTMENT_OTHER): Payer: Self-pay | Admitting: Radiology

## 2023-01-24 ENCOUNTER — Emergency Department (HOSPITAL_BASED_OUTPATIENT_CLINIC_OR_DEPARTMENT_OTHER): Payer: Medicare HMO

## 2023-01-24 DIAGNOSIS — K5900 Constipation, unspecified: Secondary | ICD-10-CM | POA: Diagnosis not present

## 2023-01-24 DIAGNOSIS — K5730 Diverticulosis of large intestine without perforation or abscess without bleeding: Secondary | ICD-10-CM | POA: Diagnosis not present

## 2023-01-24 LAB — URINALYSIS, ROUTINE W REFLEX MICROSCOPIC
Bilirubin Urine: NEGATIVE
Glucose, UA: NEGATIVE mg/dL
Hgb urine dipstick: NEGATIVE
Ketones, ur: NEGATIVE mg/dL
Leukocytes,Ua: NEGATIVE
Nitrite: NEGATIVE
Protein, ur: NEGATIVE mg/dL
Specific Gravity, Urine: 1.01 (ref 1.005–1.030)
pH: 6 (ref 5.0–8.0)

## 2023-01-24 MED ORDER — MAGNESIUM CITRATE PO SOLN: 1.0000 | Freq: Once | ORAL | Status: DC

## 2023-01-24 MED ORDER — IOHEXOL 300 MG/ML  SOLN
100.0000 mL | Freq: Once | INTRAMUSCULAR | Status: AC | PRN
  Administered 2023-01-24: 100 mL via INTRAVENOUS

## 2023-01-24 NOTE — ED Provider Notes (Incomplete)
[facility] Provider Note: Lowella Dell, MD, FACEP  CSN: [id number] MRN: [id number] ARRIVAL: 01/23/23 at 1950 ROOM: [facility]   CHIEF COMPLAINT  Constipation   HISTORY OF PRESENT ILLNESS  01/24/23 12:00 AM Kerry Matthews is a 87 y.o. male who has not had a bowel movement   Past Medical History:  Diagnosis Date  . 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 Abnormal CT of the chest   . Abnormal findings on diagnostic imaging of lung 04/12/2019   04/01/2019-CT chest with contrast- no acute process in chest abdomen or pelvis, interstitial lung disease suspicious for early or mild UIP, pulmonary artery enlargement suggest PAH   . Acquired trigger finger of left little finger 10/27/2022  . Acute gallstone pancreatitis 09/12/2022  . Adult BMI 27.0-27.9 kg/sq m 07/08/2022  . Alpha-1-antitrypsin deficiency carrier 05/04/2019  . Atherosclerotic heart disease of native coronary artery without angina pectoris 05/03/2018  . Body mass index (BMI) 29.0-29.9, adult 01/05/2020  . Choledocholithiasis 09/11/2022  . Chronic kidney disease, stage 3a (HCC) 08/12/2021  . Chronic respiratory failure with hypoxia (HCC) 03/06/2022  . CKD (chronic kidney disease) 05/03/2018  . Coronary artery disease involving native coronary artery of native heart without angina pectoris 05/03/2018  . Diabetes mellitus type 2 in obese 04/01/2019  . Diabetic polyneuropathy associated with type 2 diabetes mellitus (HCC) 08/12/2021  . Dyslipidemia associated with type 2 diabetes mellitus (HCC) 05/03/2018  . Dysuria 09/07/2020  . Elevated liver enzymes 09/19/2022  . Elevated rheumatoid factor 04/12/2019   04/03/2019-rheumatoid factor-95.4  . GAD (generalized anxiety disorder)   . GERD without esophagitis  05/03/2018  . Gouty arthritis of left great toe 07/08/2022  . History of kidney stones   . Hypertensive heart disease without heart failure 05/03/2018  . Hyponatremia 08/12/2021  . Idiopathic pulmonary fibrosis (HCC) 03/29/2021  . 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  . Interstitial pulmonary disease (HCC) 04/12/2019   04/01/2019-CT chest with contrast- no acute process in chest abdomen or pelvis, interstitial lung disease suspicious for early or mild UIP, pulmonary artery enlargement suggest PAH  04/02/2019-connective tissue work-up: Anti-Jo 1-negative Anti-DNA antibody double-stranded-negative Anti-scleroderma antibody-negative Sjogren's syndrome antibody-negative Sjogren's syndrome antibody-negative CK-31 CCP-6 E  . Leukocytosis 03/08/2020  . Low vitamin B12 level 04/03/2019  . Medicare annual wellness visit, subsequent 08/09/2020  . Microscopic polyangiitis (HCC) 03/29/2021  . Mixed diabetic hyperlipidemia associated with type 2 diabetes mellitus (HCC) 08/12/2021  . Mixed hyperlipidemia 05/03/2018  . Osteoarthritis   . Other emphysema (HCC)   . Other long term (current) drug therapy 03/29/2021  . Pain in finger of left hand 11/17/2022  . Peripheral polyneuropathy 01/30/2020  . Pneumonia of left lower lobe due to infectious organism 10/05/2022  . Polymyositis (HCC)   . Recurrent UTI   . Respiratory disorder concurrent with and due to microscopic polyangiitis (HCC) 08/12/2021  . Rheumatoid factor positive 03/29/2021  . SIRS (systemic inflammatory response syndrome) (HCC) 09/11/2022  . Skin cancer   . 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/17/2020  . Swollen L ankle 09/19/2022  . Transaminitis   . Type 2 diabetes mellitus with hyperglycemia, with long-term current use of insulin (HCC) 04/23/2021  . UC (ulcerative colitis) (HCC)   . Urinary frequency 09/19/2022  . Vasculitis (HCC) 03/29/2021  . Vitamin D deficiency 10/21/2022    Past Surgical  History:  Procedure Laterality  Date  . ANGIOPLASTY  2010  . BACK SURGERY    . CATARACT EXTRACTION    . COLONOSCOPY  06/16/2005   Mild colitis involving splenic flexure. Colonic polyps, status post polypectomy. Mild pancolonic diverticulitits. Internal hemorrhoids.   Marland Kitchen ERCP N/A 09/13/2022   Procedure: ENDOSCOPIC RETROGRADE CHOLANGIOPANCREATOGRAPHY (ERCP);  Surgeon: Iva Boop, MD;  Location: [facility];  Service: Gastroenterology;  Laterality: N/A;  . ESOPHAGOGASTRODUODENOSCOPY  04/26/2003   Irregular Z line suggestive of GERD. Mild gastritis status post CLO testing.   Marland Kitchen REMOVAL OF STONES  09/13/2022   Procedure: REMOVAL OF STONES;  Surgeon: Iva Boop, MD;  Location: [facility];  Service: Gastroenterology;;  . Dennison Mascot  09/13/2022   Procedure: SPHINCTEROTOMY;  Surgeon: Iva Boop, MD;  Location: [facility];  Service: Gastroenterology;;  . TOTAL KNEE ARTHROPLASTY Left 12/17/2020   Procedure: LEFT TOTAL KNEE ARTHROPLASTY;  Surgeon: Tarry Kos, MD;  Location: [facility];  Service: Orthopedics;  Laterality: Left;  . TRIGGER FINGER RELEASE      Family History  Problem Relation Age of Onset  . Tuberculosis Mother   . Stroke Father   . Pancreatic cancer Sister   . Heart attack Sister   . Lung disease Sister   . Clotting disorder Brother   . Colon cancer Neg Hx   . Esophageal cancer Neg Hx     Social History   Tobacco Use  . Smoking status: Never  . Smokeless tobacco: Never  Vaping Use  . Vaping Use: Never used  Substance Use Topics  . Alcohol use: Never  . Drug use: Never    Prior to Admission medications   Medication Sig Start Date End Date Taking? Authorizing Provider  aspirin EC 81 MG tablet Take 1 tablet (81 mg total) by mouth daily. Swallow whole. 09/22/22   Rodolph Bong, MD  ciprofloxacin (CIPRO) 250 MG tablet Take 1 tablet (250 mg total) by mouth 2 (two) times daily for 14 days. 01/20/23 02/03/23  CoxFritzi Mandes, MD  Emollient (LUBRIDERM SERIOUSLY SENSITIVE) LOTN Apply 1  Application topically as needed (itching). 09/15/22   Rodolph Bong, MD  Evolocumab (REPATHA) 140 MG/ML SOSY Inject 140 mg into the skin every 14 (fourteen) days. 10/24/22   Cox, Fritzi Mandes, MD  furosemide (LASIX) 20 MG tablet TAKE 1 TABLET EVERY DAY AS NEEDED 04/11/22   Cox, Fritzi Mandes, MD  gabapentin (NEURONTIN) 400 MG capsule TAKE 1 CAPSULE THREE TIMES DAILY 01/13/23   Cox, Kirsten, MD  glucose blood (TRUE METRIX BLOOD GLUCOSE TEST) test strip TEST BLOOD SUGAR THREE TIMES DAILY BEFORE MEALS 12/19/21   Cox, Kirsten, MD  Multiple Vitamins-Minerals (PRESERVISION AREDS 2) CAPS Take 1 capsule by mouth 2 (two) times daily after a meal.    [provider]  nitroGLYCERIN (NITROSTAT) 0.4 MG SL tablet Place 1 tablet (0.4 mg total) under the tongue every 5 (five) minutes as needed for chest pain. 03/13/22   Revankar, Aundra Dubin, MD  omeprazole (PRILOSEC) 20 MG capsule TAKE 1 CAPSULE EVERY DAY 01/13/23   Cox, Kirsten, MD  polyethylene glycol (MIRALAX / GLYCOLAX) 17 g packet Take 17 g by mouth daily.    [provider]  riTUXimab (RITUXAN) 500 MG/50ML injection Inject into the vein every 6 (six) months. 06/18/22   [provider]  traMADol (ULTRAM) 50 MG tablet Take 1 tablet (50 mg total) by mouth 2 (two) times daily as needed for up to 5 days for severe pain. 01/20/23 01/25/23  Cox,  Kirsten, MD  vitamin B-12 (CYANOCOBALAMIN) 1000 MCG tablet Take 1 tablet (1,000 mcg total) by mouth daily. 04/06/19   Joseph Art, DO    Allergies Ace inhibitors, Hydrocodone, Hydrocodone-acetaminophen, Nsaids, Sulfamethoxazole, Sulfamethoxazole-trimethoprim, and Trimethoprim   REVIEW OF SYSTEMS  Negative except as noted here or in the History of Present Illness.   PHYSICAL EXAMINATION  Initial Vital Signs Blood pressure 134/79, pulse 84, temperature 97.9 F (36.6 C), resp. rate 18, SpO2 97 %.  Examination General: Well-developed, well-nourished male in no acute distress; appearance consistent with age  of record HENT: normocephalic; atraumatic Eyes: pupils equal, round and reactive to light; extraocular muscles intact Neck: supple Heart: regular rate and rhythm; no murmurs, rubs or gallops Lungs: clear to auscultation bilaterally Abdomen: soft; nondistended; nontender; no masses or hepatosplenomegaly; bowel sounds present Extremities: No deformity; full range of motion; pulses normal Neurologic: Awake, alert and oriented; motor function intact in all extremities and symmetric; no facial droop Skin: Warm and dry Psychiatric: Normal mood and affect   RESULTS  Summary of this visit's results, reviewed and interpreted by myself:   EKG Interpretation  Date/Time:    Ventricular Rate:    PR Interval:    QRS Duration:   QT Interval:    QTC Calculation:   R Axis:     Text Interpretation:         Laboratory Studies: Results for orders placed or performed during the hospital encounter of 01/23/23 (from the past 24 hour(s))  CBC with Differential     Status: Abnormal   Collection Time: 01/23/23 10:37 PM  Result Value Ref Range   WBC 14.2 (H) 4.0 - 10.5 K/uL   RBC 5.64 4.22 - 5.81 MIL/uL   Hemoglobin 16.2 13.0 - 17.0 g/dL   HCT 16.1 09.6 - 04.5 %   MCV 86.3 80.0 - 100.0 fL   MCH 28.7 26.0 - 34.0 pg   MCHC 33.3 30.0 - 36.0 g/dL   RDW 40.9 81.1 - 91.4 %   Platelets 350 150 - 400 K/uL   nRBC 0.0 0.0 - 0.2 %   Neutrophils Relative % 64 %   Neutro Abs 9.0 (H) 1.7 - 7.7 K/uL   Lymphocytes Relative 22 %   Lymphs Abs 3.2 0.7 - 4.0 K/uL   Monocytes Relative 8 %   Monocytes Absolute 1.2 (H) 0.1 - 1.0 K/uL   Eosinophils Relative 5 %   Eosinophils Absolute 0.7 (H) 0.0 - 0.5 K/uL   Basophils Relative 1 %   Basophils Absolute 0.1 0.0 - 0.1 K/uL   Immature Granulocytes 0 %   Abs Immature Granulocytes 0.05 0.00 - 0.07 K/uL  Comprehensive metabolic panel     Status: Abnormal   Collection Time: 01/23/23 10:37 PM  Result Value Ref Range   Sodium 133 (L) 135 - 145 mmol/L   Potassium 4.6  3.5 - 5.1 mmol/L   Chloride 95 (L) 98 - 111 mmol/L   CO2 27 22 - 32 mmol/L   Glucose, Bld 155 (H) 70 - 99 mg/dL   BUN 16 8 - 23 mg/dL   Creatinine, Ser 7.82 0.61 - 1.24 mg/dL   Calcium 9.0 8.9 - 95.6 mg/dL   Total Protein 7.9 6.5 - 8.1 g/dL   Albumin 4.2 3.5 - 5.0 g/dL   AST 23 15 - 41 U/L   ALT 16 0 - 44 U/L   Alkaline Phosphatase 174 (H) 38 - 126 U/L   Total Bilirubin 0.7 0.3 - 1.2 mg/dL   GFR, Estimated >  60 >60 mL/min   Anion gap 11 5 - 15   Imaging Studies: DG Abdomen 1 View  Result Date: 01/23/2023 CLINICAL DATA:  Constipation. No bowel movement since Saturday. Back pain. EXAM: ABDOMEN - 1 VIEW COMPARISON:  CT 09/11/2022 FINDINGS: Stool throughout the colon. Gas-filled nondistended small bowel. Changes are compatible with clinical history of constipation. No radiopaque stones. Degenerative changes in the spine. Coarse interstitial infiltrates in the lung bases likely pulmonary fibrosis. IMPRESSION: Diffusely stool-filled colon compatible with history of constipation. Electronically Signed   By: Burman Nieves M.D.   On: 01/23/2023 20:25    ED COURSE and MDM  Nursing notes, initial and subsequent vitals signs, including pulse oximetry, reviewed and interpreted by myself.  Vitals:   01/23/23 1956 01/23/23 2324  BP: (!) 157/86 134/79  Pulse: 81 84  Resp: (!) 30 18  Temp: 97.6 F (36.4 C) 97.9 F (36.6 C)  TempSrc: Oral   SpO2: 95% 97%   Medications - No data to display    PROCEDURES  Procedures   ED DIAGNOSES  No diagnosis found.

## 2023-01-24 NOTE — ED Provider Notes (Signed)
[facility] Provider Note: Lowella Dell, MD, FACEP  CSN: [id number] MRN: [id number] ARRIVAL: 01/23/23 at 1950 ROOM: [facility]06/[facility]06   CHIEF COMPLAINT  Constipation   HISTORY OF PRESENT ILLNESS  01/24/23 12:00 AM Kerry Matthews is a 87 y.o. male with a history of constipation who takes MiraLAX as needed.  He has not had a bowel movement in a week.  He developed low back pain about 8 days ago after climbing on a ladder.  He did not fall or have any other trauma.  He saw his PCP and was started on tramadol for this pain 4 days ago.  He was constipated before he started the tramadol.  He continues to have low back pain which he rates as an 8 out of 10.  Yesterday he started having some abdominal pain which is generalized and not severe.  He has had no nausea or vomiting.  He has been taking MiraLAX daily and asked taken a bottle of magnesium citrate as well as prune juice.   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  ANCA antibodies- myeloperoxidase ABS-greater than 100, ANCA proteinase 3-less than 3.5 04/02/2019- ANCA titers- p-ANCA +1: 160, C ANCA-less than 1: 20, atypical p-ANCA titer-less than 1: 20   Abnormal CT of the chest    Abnormal findings on diagnostic imaging of lung 04/12/2019   04/01/2019-CT chest with contrast- no acute process in chest abdomen or pelvis, interstitial lung disease suspicious for early or mild UIP, pulmonary artery enlargement suggest PAH    Acquired trigger finger of left little finger 10/27/2022   Acute gallstone pancreatitis 09/12/2022   Adult BMI 27.0-27.9 kg/sq m 07/08/2022   Alpha-1-antitrypsin deficiency carrier 05/04/2019   Atherosclerotic heart disease of native coronary artery without angina pectoris 05/03/2018   Body mass index (BMI) 29.0-29.9, adult 01/05/2020   Choledocholithiasis 09/11/2022   Chronic kidney disease, stage 3a (HCC) 08/12/2021   Chronic respiratory failure with hypoxia (HCC)  03/06/2022   CKD (chronic kidney disease) 05/03/2018   Coronary artery disease involving native coronary artery of native heart without angina pectoris 05/03/2018   Diabetes mellitus type 2 in obese 04/01/2019   Diabetic polyneuropathy associated with type 2 diabetes mellitus (HCC) 08/12/2021   Dyslipidemia associated with type 2 diabetes mellitus (HCC) 05/03/2018   Dysuria 09/07/2020   Elevated liver enzymes 09/19/2022   Elevated rheumatoid factor 04/12/2019   04/03/2019-rheumatoid factor-95.4   GAD (generalized anxiety disorder)    GERD without esophagitis 05/03/2018   Gouty arthritis of left great toe 07/08/2022   History of kidney stones    Hypertensive heart disease without heart failure 05/03/2018   Hyponatremia 08/12/2021   Idiopathic pulmonary fibrosis (HCC) 03/29/2021   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   Interstitial pulmonary disease (HCC) 04/12/2019   04/01/2019-CT chest with contrast- no acute process in chest abdomen or pelvis, interstitial lung disease suspicious for early or mild UIP, pulmonary artery enlargement suggest PAH  04/02/2019-connective tissue work-up: Anti-Jo 1-negative Anti-DNA antibody double-stranded-negative Anti-scleroderma antibody-negative Sjogren's syndrome antibody-negative Sjogren's syndrome antibody-negative CK-31 CCP-6 E   Leukocytosis 03/08/2020   Low vitamin B12 level 04/03/2019   Medicare annual wellness visit, subsequent 08/09/2020   Microscopic polyangiitis (HCC) 03/29/2021   Mixed diabetic hyperlipidemia associated with type 2 diabetes mellitus (HCC) 08/12/2021   Mixed hyperlipidemia 05/03/2018   Osteoarthritis    Other emphysema (HCC)    Other long term (current) drug therapy 03/29/2021   Pain in finger of left hand 11/17/2022  Peripheral polyneuropathy 01/30/2020   Pneumonia of left lower lobe due to infectious organism 10/05/2022   Polymyositis (HCC)    Recurrent UTI    Respiratory disorder concurrent with and due to microscopic  polyangiitis (HCC) 08/12/2021   Rheumatoid factor positive 03/29/2021   SIRS (systemic inflammatory response syndrome) (HCC) 09/11/2022   Skin cancer    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/17/2020   Swollen L ankle 09/19/2022   Transaminitis    Type 2 diabetes mellitus with hyperglycemia, with long-term current use of insulin (HCC) 04/23/2021   UC (ulcerative colitis) (HCC)    Urinary frequency 09/19/2022   Vasculitis (HCC) 03/29/2021   Vitamin D deficiency 10/21/2022    Past Surgical History:  Procedure Laterality Date   ANGIOPLASTY  2010   BACK SURGERY     CATARACT EXTRACTION     COLONOSCOPY  06/16/2005   Mild colitis involving splenic flexure. Colonic polyps, status post polypectomy. Mild pancolonic diverticulitits. Internal hemorrhoids.    ERCP N/A 09/13/2022   Procedure: ENDOSCOPIC RETROGRADE CHOLANGIOPANCREATOGRAPHY (ERCP);  Surgeon: Iva Boop, MD;  Location: [facility];  Service: Gastroenterology;  Laterality: N/A;   ESOPHAGOGASTRODUODENOSCOPY  04/26/2003   Irregular Z line suggestive of GERD. Mild gastritis status post CLO testing.    REMOVAL OF STONES  09/13/2022   Procedure: REMOVAL OF STONES;  Surgeon: Iva Boop, MD;  Location: [facility];  Service: Gastroenterology;;   Dennison Mascot  09/13/2022   Procedure: Dennison Mascot;  Surgeon: Iva Boop, MD;  Location: [facility];  Service: Gastroenterology;;   TOTAL KNEE ARTHROPLASTY Left 12/17/2020   Procedure: LEFT TOTAL KNEE ARTHROPLASTY;  Surgeon: Tarry Kos, MD;  Location: [facility];  Service: Orthopedics;  Laterality: Left;   TRIGGER FINGER RELEASE      Family History  Problem Relation Age of Onset   Tuberculosis Mother    Stroke Father    Pancreatic cancer Sister    Heart attack Sister    Lung disease Sister    Clotting disorder Brother    Colon cancer Neg Hx    Esophageal cancer Neg Hx     Social History   Tobacco Use   Smoking status: Never   Smokeless tobacco: Never  Vaping  Use   Vaping Use: Never used  Substance Use Topics   Alcohol use: Never   Drug use: Never    Prior to Admission medications   Medication Sig Start Date End Date Taking? Authorizing Provider  aspirin EC 81 MG tablet Take 1 tablet (81 mg total) by mouth daily. Swallow whole. 09/22/22   Rodolph Bong, MD  ciprofloxacin (CIPRO) 250 MG tablet Take 1 tablet (250 mg total) by mouth 2 (two) times daily for 14 days. 01/20/23 02/03/23  CoxFritzi Mandes, MD  Emollient (LUBRIDERM SERIOUSLY SENSITIVE) LOTN Apply 1 Application topically as needed (itching). 09/15/22   Rodolph Bong, MD  Evolocumab (REPATHA) 140 MG/ML SOSY Inject 140 mg into the skin every 14 (fourteen) days. 10/24/22   Cox, Fritzi Mandes, MD  furosemide (LASIX) 20 MG tablet TAKE 1 TABLET EVERY DAY AS NEEDED 04/11/22   Cox, Kirsten, MD  gabapentin (NEURONTIN) 400 MG capsule TAKE 1 CAPSULE THREE TIMES DAILY 01/13/23   Cox, Kirsten, MD  glucose blood (TRUE METRIX BLOOD GLUCOSE TEST) test strip TEST BLOOD SUGAR THREE TIMES DAILY BEFORE MEALS 12/19/21   Cox, Kirsten, MD  Multiple Vitamins-Minerals (PRESERVISION AREDS 2) CAPS Take 1 capsule by mouth 2 (two) times daily after a meal.    [provider]  nitroGLYCERIN (NITROSTAT) 0.4 MG SL tablet Place 1 tablet (0.4 mg total) under the tongue every 5 (five) minutes as needed for chest pain. 03/13/22   Revankar, Aundra Dubin, MD  omeprazole (PRILOSEC) 20 MG capsule TAKE 1 CAPSULE EVERY DAY 01/13/23   Cox, Kirsten, MD  polyethylene glycol (MIRALAX / GLYCOLAX) 17 g packet Take 17 g by mouth daily.    [provider]  riTUXimab (RITUXAN) 500 MG/50ML injection Inject into the vein every 6 (six) months. 06/18/22   [provider]  traMADol (ULTRAM) 50 MG tablet Take 1 tablet (50 mg total) by mouth 2 (two) times daily as needed for up to 5 days for severe pain. 01/20/23 01/25/23  CoxFritzi Mandes, MD  vitamin B-12 (CYANOCOBALAMIN) 1000 MCG tablet Take 1 tablet (1,000 mcg total) by mouth daily. 04/06/19    Joseph Art, DO    Allergies Ace inhibitors, Hydrocodone, Hydrocodone-acetaminophen, Nsaids, Sulfamethoxazole, Sulfamethoxazole-trimethoprim, and Trimethoprim   REVIEW OF SYSTEMS  Negative except as noted here or in the History of Present Illness.   PHYSICAL EXAMINATION  Initial Vital Signs Blood pressure 134/79, pulse 84, temperature 97.9 F (36.6 C), resp. rate 18, SpO2 97 %.  Examination General: Well-developed, well-nourished male in no acute distress; appearance consistent with age of record HENT: normocephalic; atraumatic Eyes: Normal appearance Neck: supple Heart: regular rate and rhythm Lungs: clear to auscultation bilaterally Abdomen: soft; nondistended; mild diffuse tenderness; bowel sounds present Rectal: Normal sphincter tone; no impaction; minimal soft, brown stool in vault Extremities: No deformity; full range of motion; pulses normal Neurologic: Awake, alert and oriented; motor function intact in all extremities and symmetric; no facial droop Skin: Warm and dry Psychiatric: Normal mood and affect   RESULTS  Summary of this visit's results, reviewed and interpreted by myself:   EKG Interpretation  Date/Time:    Ventricular Rate:    PR Interval:    QRS Duration:   QT Interval:    QTC Calculation:   R Axis:     Text Interpretation:         Laboratory Studies: Results for orders placed or performed during the hospital encounter of 01/23/23 (from the past 24 hour(s))  CBC with Differential     Status: Abnormal   Collection Time: 01/23/23 10:37 PM  Result Value Ref Range   WBC 14.2 (H) 4.0 - 10.5 K/uL   RBC 5.64 4.22 - 5.81 MIL/uL   Hemoglobin 16.2 13.0 - 17.0 g/dL   HCT 16.1 09.6 - 04.5 %   MCV 86.3 80.0 - 100.0 fL   MCH 28.7 26.0 - 34.0 pg   MCHC 33.3 30.0 - 36.0 g/dL   RDW 40.9 81.1 - 91.4 %   Platelets 350 150 - 400 K/uL   nRBC 0.0 0.0 - 0.2 %   Neutrophils Relative % 64 %   Neutro Abs 9.0 (H) 1.7 - 7.7 K/uL   Lymphocytes Relative 22  %   Lymphs Abs 3.2 0.7 - 4.0 K/uL   Monocytes Relative 8 %   Monocytes Absolute 1.2 (H) 0.1 - 1.0 K/uL   Eosinophils Relative 5 %   Eosinophils Absolute 0.7 (H) 0.0 - 0.5 K/uL   Basophils Relative 1 %   Basophils Absolute 0.1 0.0 - 0.1 K/uL   Immature Granulocytes 0 %   Abs Immature Granulocytes 0.05 0.00 - 0.07 K/uL  Comprehensive metabolic panel     Status: Abnormal   Collection Time: 01/23/23 10:37 PM  Result Value Ref Range   Sodium 133 (L) 135 -  145 mmol/L   Potassium 4.6 3.5 - 5.1 mmol/L   Chloride 95 (L) 98 - 111 mmol/L   CO2 27 22 - 32 mmol/L   Glucose, Bld 155 (H) 70 - 99 mg/dL   BUN 16 8 - 23 mg/dL   Creatinine, Ser 1.61 0.61 - 1.24 mg/dL   Calcium 9.0 8.9 - 09.6 mg/dL   Total Protein 7.9 6.5 - 8.1 g/dL   Albumin 4.2 3.5 - 5.0 g/dL   AST 23 15 - 41 U/L   ALT 16 0 - 44 U/L   Alkaline Phosphatase 174 (H) 38 - 126 U/L   Total Bilirubin 0.7 0.3 - 1.2 mg/dL   GFR, Estimated >04 >54 mL/min   Anion gap 11 5 - 15  Urinalysis, Routine w reflex microscopic -Urine, Clean Catch     Status: None   Collection Time: 01/24/23 12:43 AM  Result Value Ref Range   Color, Urine YELLOW YELLOW   APPearance CLEAR CLEAR   Specific Gravity, Urine 1.010 1.005 - 1.030   pH 6.0 5.0 - 8.0   Glucose, UA NEGATIVE NEGATIVE mg/dL   Hgb urine dipstick NEGATIVE NEGATIVE   Bilirubin Urine NEGATIVE NEGATIVE   Ketones, ur NEGATIVE NEGATIVE mg/dL   Protein, ur NEGATIVE NEGATIVE mg/dL   Nitrite NEGATIVE NEGATIVE   Leukocytes,Ua NEGATIVE NEGATIVE   Imaging Studies: CT ABDOMEN PELVIS W CONTRAST  Result Date: 01/24/2023 CLINICAL DATA:  Bowel obstruction suspected constipation Patient reports no bowel movement since last Saturday. EXAM: CT ABDOMEN AND PELVIS WITH CONTRAST TECHNIQUE: Multidetector CT imaging of the abdomen and pelvis was performed using the standard protocol following bolus administration of intravenous contrast. RADIATION DOSE REDUCTION: This exam was performed according to the  departmental dose-optimization program which includes automated exposure control, adjustment of the mA and/or kV according to patient size and/or use of iterative reconstruction technique. CONTRAST:  OMNIPAQUE IOHEXOL 300 MG/ML  SOLN COMPARISON:  Radiograph earlier today. CTA 09/11/2022 FINDINGS: Lower chest: Basilar pulmonary fibrosis, similar. No superimposed acute airspace disease. Hepatobiliary: Calcified granuloma in the right lobe of the liver. No focal liver abnormality. Pneumobilia is likely related to prior ERCP. Gallbladder is only minimally distended. No calcified gallstone or gallbladder inflammation. Pancreas: Fatty atrophy.  No ductal dilatation or inflammation. Spleen: Normal in size without focal abnormality. Adrenals/Urinary Tract: Normal adrenal glands. No hydronephrosis or perinephric edema. Homogeneous renal enhancement with symmetric excretion on delayed phase imaging. The left renal collecting system is duplicated. Mild bilateral renal parenchymal thinning. Urinary bladder is partially distended. The right aspect of the urinary bladder extends into right inguinal hernia. Mild wall thickening of the herniated bladder at the hernia neck. Stomach/Bowel: Large volume of colonic stool with colonic redundancy. Left colonic diverticulosis. No diverticulitis or colonic inflammation. No obvious colonic mass. Diminutive normal appendix tentatively visualized. There is no small bowel dilatation or evidence of small-bowel obstruction. Stomach is nondistended. Vascular/Lymphatic: Aortic atherosclerosis and tortuosity. No aneurysm. Patent portal, splenic and mesenteric veins. No adenopathy. Reproductive: Prostate is unremarkable. Other: Right inguinal hernia contains portion of the right aspect of the urinary bladder as well as trace free fluid. No free air. No ascites. Musculoskeletal: L3 compression fracture is chronic. Chronic undulation of inferior L2 vertebral body and superior endplate of T12  vertebral body. The bones are subjectively under mineralized. There are no acute or suspicious osseous abnormalities. IMPRESSION: 1. Large volume of colonic stool with colonic redundancy, consistent with constipation. No bowel obstruction. 2. Right inguinal hernia contains portion of the right aspect of  the urinary bladder as well as trace free fluid. There is mild wall thickening of the herniated bladder at the hernia neck. Recommend urologic consultation 3. Colonic diverticulosis without diverticulitis. 4. Pneumobilia is likely related to prior ERCP. 5. Chronic pulmonary fibrosis at the lung bases. Aortic Atherosclerosis (ICD10-I70.0). Electronically Signed   By: Narda Rutherford M.D.   On: 01/24/2023 00:49   DG Abdomen 1 View  Result Date: 01/23/2023 CLINICAL DATA:  Constipation. No bowel movement since Saturday. Back pain. EXAM: ABDOMEN - 1 VIEW COMPARISON:  CT 09/11/2022 FINDINGS: Stool throughout the colon. Gas-filled nondistended small bowel. Changes are compatible with clinical history of constipation. No radiopaque stones. Degenerative changes in the spine. Coarse interstitial infiltrates in the lung bases likely pulmonary fibrosis. IMPRESSION: Diffusely stool-filled colon compatible with history of constipation. Electronically Signed   By: Burman Nieves M.D.   On: 01/23/2023 20:25    ED COURSE and MDM  Nursing notes, initial and subsequent vitals signs, including pulse oximetry, reviewed and interpreted by myself.  Vitals:   01/23/23 1956 01/23/23 2324  BP: (!) 157/86 134/79  Pulse: 81 84  Resp: (!) 30 18  Temp: 97.6 F (36.4 C) 97.9 F (36.6 C)  TempSrc: Oral   SpO2: 95% 97%   Medications  iohexol (OMNIPAQUE) 300 MG/ML solution 100 mL (100 mLs Intravenous Contrast Given 01/24/23 0028)   The patient was advised of CT findings consistent with constipation.  He was advised to take MiraLAX daily and he was advised to take Senokot per package instructions to help stimulate his bowels to  move.  He was advised not to use Senokot on a regular basis.  He was also advised of the findings of a bladder containing right inguinal hernia.  He was advised that the tramadol he is taking may be contributing to his constipation.   PROCEDURES  Procedures   ED DIAGNOSES     ICD-10-CM   1. Constipation, unspecified constipation type  K59.00     2. Inguinal hernia of right side without obstruction or gangrene  K40.90          Finbar Nippert, MD 01/24/23 0981

## 2023-01-26 ENCOUNTER — Telehealth: Payer: Self-pay

## 2023-01-26 NOTE — Telephone Encounter (Signed)
Patient's wife called back and stated that the patient had done the whole bottle and only had one small BM. Patient also stated that his back was still hurting. Patient's provider Dr. Sedalia Muta was consulted with and she stated patient would need to come by and pick up samples for Linzess take 2 capsules prn Constipation. Wife came and picked up samples for the patient and stated she would call us if it doesn't help.

## 2023-01-26 NOTE — Telephone Encounter (Signed)
Patient's wife called and stated that the patient still is struggling to have a BM. The patient went to the [facility] after calling the on-call phone and stated that he had small bm on Saturday and Sunday but no full bowel movement. Patient's wife is asking if there is anything that can be done for him to have relief of some kind. Patient's Provider Dr. Sedalia Muta was consulted with and she recommended that the patient do 1/2 bottle of magnesium citrate and then 4 hours later if there is still no BM do the other half bottle to ensure a BM. Patient's wife informed and stated she will try and see if this helps the patient.

## 2023-01-29 NOTE — Assessment & Plan Note (Signed)
Well controlled.  Continue with Repatha. Continue to work on eating a healthy diet and exercise.  Labs drawn today.   

## 2023-01-29 NOTE — Assessment & Plan Note (Addendum)
Well controlled.  No changes to medicines. Continue lasix 20 mg once daily as needed, aspirin 81 mg daily, and Repatha every 2 weeks.   Management per specialist.

## 2023-01-29 NOTE — Assessment & Plan Note (Signed)
-  Continue omeprazole 20 mg daily

## 2023-01-29 NOTE — Assessment & Plan Note (Signed)
Control: good Recommend check sugars fasting daily. Recommend check feet daily. Recommend annual eye exams. Medicines: remain off metformin Continue to work on eating a healthy diet and exercise.  Labs drawn today.    

## 2023-01-29 NOTE — Progress Notes (Unsigned)
Subjective:  Patient ID: Kerry Matthews, male    DOB: [birth year]  Age: 87 y.o. MRN: [id number]  Chief Complaint  Patient presents with   Medical Management of Chronic Issues    HPI   Diabetes:  Complications: neuropathy Glucose checking: before meals and before bed  Glucose logs: 110-140 Hypoglycemia: none Current medications: Patient stopped taking this metformin over 1 week ago due to itching which he states that it has helped. Taking Gabapentin 400 mg 1 capsule BID daily. Last Eye Exam: 04/02/2021. Needs to make an appointment Foot checks: daily.    Hyperlipidemia: Current medications: Patient is taking Repatha   Hypertensive heart disease. Has history of stent  Current medications: lasix 20 mg once daily as needed, aspirin 81 mg daily,    Diet: eating healthy. Exercise: home physical therapy and OT.      Microscopic polyangitis: pulmonary and rheumatology. on rituxin 100 mg every 6 months.  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 Sees pulmonology. No longer needing oxygen.   B12 deficiency: otc b12 1000 mcg once daily.   Vitamin D deficiency: taking vitamin D 50,000 take 1 capsule once weekly.   History of recurrent UTIs that frequently became septic. Requesting UA.      01/30/2023    8:25 AM 07/08/2022    8:31 AM 04/08/2022    9:34 AM 03/31/2022    7:43 AM 12/26/2021    8:02 AM  Depression screen PHQ 2/9  Decreased Interest 0 0 0 0 0  Down, Depressed, Hopeless 0 0 0 0 0  PHQ - 2 Score 0 0 0 0 0  Altered sleeping 1      Tired, decreased energy 1      Change in appetite 1      Feeling bad or failure about yourself  0      Trouble concentrating 0      Moving slowly or fidgety/restless 0      Suicidal thoughts 0      PHQ-9 Score 3      Difficult doing work/chores Somewhat difficult            01/30/2023    8:24 AM  Fall Risk   Falls in the past year? 1  Number falls in past yr: 0  Injury with Fall? 1  Risk for fall due to : Impaired  mobility  Follow up Falls evaluation completed;Falls prevention discussed    Patient Care Team: Blane Ohara, MD as PCP - General (Internal Medicine) Chilton Greathouse, MD as Consulting Physician (Pulmonary Disease) Revankar, Aundra Dubin, MD as Consulting Physician (Cardiology) Zettie Pho, [facility] (Pharmacist) Zenovia Jordan, MD as Consulting Physician (Rheumatology)   Review of Systems  Constitutional:  Negative for chills, fatigue and fever.  HENT:  Negative for congestion, ear pain and sore throat.   Respiratory:  Negative for cough and shortness of breath.   Cardiovascular:  Negative for chest pain and leg swelling.  Gastrointestinal:  Negative for abdominal pain, constipation, diarrhea, nausea and vomiting.  Endocrine: Negative for polydipsia, polyphagia and polyuria.  Genitourinary:  Negative for dysuria and frequency.  Musculoskeletal:  Positive for back pain. Negative for arthralgias and myalgias.  Neurological:  Negative for dizziness and headaches.  Psychiatric/Behavioral:  Negative for dysphoric mood.        No dysphoria    Current Outpatient Medications on File Prior to Visit  Medication Sig Dispense Refill   aspirin EC 81 MG tablet Take 1 tablet (81 mg total) by mouth daily. Swallow whole. 30 tablet  11   Emollient (LUBRIDERM SERIOUSLY SENSITIVE) LOTN Apply 1 Application topically as needed (itching).  0   furosemide (LASIX) 20 MG tablet TAKE 1 TABLET EVERY DAY AS NEEDED 90 tablet 1   gabapentin (NEURONTIN) 400 MG capsule TAKE 1 CAPSULE THREE TIMES DAILY 270 capsule 1   glucose blood (TRUE METRIX BLOOD GLUCOSE TEST) test strip TEST BLOOD SUGAR THREE TIMES DAILY BEFORE MEALS 300 strip 3   Multiple Vitamins-Minerals (PRESERVISION AREDS 2) CAPS Take 1 capsule by mouth 2 (two) times daily after a meal.     nitroGLYCERIN (NITROSTAT) 0.4 MG SL tablet Place 1 tablet (0.4 mg total) under the tongue every 5 (five) minutes as needed for chest pain. 25 tablet 9   omeprazole (PRILOSEC)  20 MG capsule TAKE 1 CAPSULE EVERY DAY 90 capsule 1   polyethylene glycol (MIRALAX / GLYCOLAX) 17 g packet Take 17 g by mouth daily.     riTUXimab (RITUXAN) 500 MG/50ML injection Inject into the vein every 6 (six) months.     vitamin B-12 (CYANOCOBALAMIN) 1000 MCG tablet Take 1 tablet (1,000 mcg total) by mouth daily. 30 tablet 0   No current facility-administered medications on file prior to visit.   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  ANCA antibodies- myeloperoxidase ABS-greater than 100, ANCA proteinase 3-less than 3.5 04/02/2019- ANCA titers- p-ANCA +1: 160, C ANCA-less than 1: 20, atypical p-ANCA titer-less than 1: 20   Abnormal CT of the chest    Abnormal findings on diagnostic imaging of lung 04/12/2019   04/01/2019-CT chest with contrast- no acute process in chest abdomen or pelvis, interstitial lung disease suspicious for early or mild UIP, pulmonary artery enlargement suggest PAH    Acquired trigger finger of left little finger 10/27/2022   Acute gallstone pancreatitis 09/12/2022   Adult BMI 27.0-27.9 kg/sq m 07/08/2022   Alpha-1-antitrypsin deficiency carrier 05/04/2019   Atherosclerotic heart disease of native coronary artery without angina pectoris 05/03/2018   Body mass index (BMI) 29.0-29.9, adult 01/05/2020   Choledocholithiasis 09/11/2022   Chronic kidney disease, stage 3a (HCC) 08/12/2021   Chronic respiratory failure with hypoxia (HCC) 03/06/2022   CKD (chronic kidney disease) 05/03/2018   Coronary artery disease involving native coronary artery of native heart without angina pectoris 05/03/2018   Diabetes mellitus type 2 in obese 04/01/2019   Diabetic polyneuropathy associated with type 2 diabetes mellitus (HCC) 08/12/2021   Dyslipidemia associated with type 2 diabetes mellitus (HCC) 05/03/2018   Dysuria 09/07/2020   Elevated liver enzymes 09/19/2022   Elevated rheumatoid factor 04/12/2019   04/03/2019-rheumatoid  factor-95.4   GAD (generalized anxiety disorder)    GERD without esophagitis 05/03/2018   Gouty arthritis of left great toe 07/08/2022   History of kidney stones    Hypertensive heart disease without heart failure 05/03/2018   Hyponatremia 08/12/2021   Idiopathic pulmonary fibrosis (HCC) 03/29/2021   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   Interstitial pulmonary disease (HCC) 04/12/2019   04/01/2019-CT chest with contrast- no acute process in chest abdomen or pelvis, interstitial lung disease suspicious for early or mild UIP, pulmonary artery enlargement suggest PAH  04/02/2019-connective tissue work-up: Anti-Jo 1-negative Anti-DNA antibody double-stranded-negative Anti-scleroderma antibody-negative Sjogren's syndrome antibody-negative Sjogren's syndrome antibody-negative CK-31 CCP-6 E   Leukocytosis 03/08/2020   Low vitamin B12 level 04/03/2019   Medicare annual wellness visit, subsequent 08/09/2020   Microscopic polyangiitis (HCC) 03/29/2021   Mixed diabetic hyperlipidemia associated with type 2 diabetes mellitus (HCC) 08/12/2021   Mixed  hyperlipidemia 05/03/2018   Osteoarthritis    Other emphysema (HCC)    Other long term (current) drug therapy 03/29/2021   Pain in finger of left hand 11/17/2022   Peripheral polyneuropathy 01/30/2020   Pneumonia of left lower lobe due to infectious organism 10/05/2022   Polymyositis (HCC)    Recurrent UTI    Respiratory disorder concurrent with and due to microscopic polyangiitis (HCC) 08/12/2021   Rheumatoid factor positive 03/29/2021   SIRS (systemic inflammatory response syndrome) (HCC) 09/11/2022   Skin cancer    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/17/2020   Swollen L ankle 09/19/2022   Transaminitis    Type 2 diabetes mellitus with hyperglycemia, with long-term current use of insulin (HCC) 04/23/2021   UC (ulcerative colitis) (HCC)    Urinary frequency 09/19/2022   Vasculitis (HCC) 03/29/2021   Vitamin D deficiency 10/21/2022    Past Surgical History:  Procedure Laterality Date   ANGIOPLASTY  2010   BACK SURGERY     CATARACT EXTRACTION     COLONOSCOPY  06/16/2005   Mild colitis involving splenic flexure. Colonic polyps, status post polypectomy. Mild pancolonic diverticulitits. Internal hemorrhoids.    ERCP N/A 09/13/2022   Procedure: ENDOSCOPIC RETROGRADE CHOLANGIOPANCREATOGRAPHY (ERCP);  Surgeon: Iva Boop, MD;  Location: [facility];  Service: Gastroenterology;  Laterality: N/A;   ESOPHAGOGASTRODUODENOSCOPY  04/26/2003   Irregular Z line suggestive of GERD. Mild gastritis status post CLO testing.    REMOVAL OF STONES  09/13/2022   Procedure: REMOVAL OF STONES;  Surgeon: Iva Boop, MD;  Location: [facility];  Service: Gastroenterology;;   Dennison Mascot  09/13/2022   Procedure: Dennison Mascot;  Surgeon: Iva Boop, MD;  Location: [facility];  Service: Gastroenterology;;   TOTAL KNEE ARTHROPLASTY Left 12/17/2020   Procedure: LEFT TOTAL KNEE ARTHROPLASTY;  Surgeon: Tarry Kos, MD;  Location: [facility];  Service: Orthopedics;  Laterality: Left;   TRIGGER FINGER RELEASE      Family History  Problem Relation Age of Onset   Tuberculosis Mother    Stroke Father    Pancreatic cancer Sister    Heart attack Sister    Lung disease Sister    Clotting disorder Brother    Colon cancer Neg Hx    Esophageal cancer Neg Hx    Social History   Socioeconomic History   Marital status: Married    Spouse name: Not on file   Number of children: 2   Years of education: Not on file   Highest education level: 8th grade  Occupational History   Occupation: retired  Tobacco Use   Smoking status: Never   Smokeless tobacco: Never  Vaping Use   Vaping Use: Never used  Substance and Sexual Activity   Alcohol use: Never   Drug use: Never   Sexual activity: Not on file  Other Topics Concern   Not on file  Social History Narrative   Not on file   Social Determinants of Health   Financial Resource  Strain: Low Risk  (01/20/2023)   Overall Financial Resource Strain (CARDIA)    Difficulty of Paying Living Expenses: Not hard at all  Food Insecurity: No Food Insecurity (01/20/2023)   Hunger Vital Sign    Worried About Running Out of Food in the Last Year: Never true    Ran Out of Food in the Last Year: Never true  Transportation Needs: No Transportation Needs (01/20/2023)   PRAPARE - Transportation    Lack of Transportation (Medical): No    Lack  of Transportation (Non-Medical): No  Physical Activity: Unknown (01/20/2023)   Exercise Vital Sign    Days of Exercise per Week: Patient declined    Minutes of Exercise per Session: Not on file  Stress: Stress Concern Present (01/20/2023)   [employer] of Occupational Health - Occupational Stress Questionnaire    Feeling of Stress : To some extent  Social Connections: Unknown (01/20/2023)   Social Connection and Isolation Panel [NHANES]    Frequency of Communication with Friends and Family: More than three times a week    Frequency of Social Gatherings with Friends and Family: Patient declined    Attends Religious Services: More than 4 times per year    Active Member of [employer] or Organizations: Patient declined    Attends Engineer, structural: Not on file    Marital Status: Married    Objective:  BP 112/78   Pulse 88   Temp (!) 97.1 F (36.2 C)   Ht 5\' 5"  (1.651 m)   Wt 168 lb 3.2 oz (76.3 kg)   BMI 27.99 kg/m      01/30/2023    8:21 AM 01/24/2023    1:29 AM 01/23/2023   11:24 PM  BP/Weight  Systolic BP 112 128 134  Diastolic BP 78 71 79  Wt. (Lbs) 168.2    BMI 27.99 kg/m2      Physical Exam Vitals reviewed.  Constitutional:      Appearance: Normal appearance. He is normal weight.  Neck:     Vascular: No carotid bruit.  Cardiovascular:     Rate and Rhythm: Normal rate and regular rhythm.     Heart sounds: Normal heart sounds.  Pulmonary:     Effort: Pulmonary effort is normal. No respiratory distress.      Breath sounds: Normal breath sounds.  Abdominal:     General: Bowel sounds are normal.     Palpations: Abdomen is soft.     Tenderness: There is no abdominal tenderness.  Musculoskeletal:        General: Tenderness (LUMBAR SPINE ON VERTEBRAE AND ON PARASPINAL MUSCLES.) present.  Neurological:     Mental Status: He is alert and oriented to person, place, and time.  Psychiatric:        Mood and Affect: Mood normal.        Behavior: Behavior normal.     Diabetic Foot Exam - Simple   Simple Foot Form Diabetic Foot exam was performed with the following findings: Yes 01/30/2023  8:31 AM  Visual Inspection See comments: Yes Sensation Testing Intact to touch and monofilament testing bilaterally: Yes Pulse Check Posterior Tibialis and Dorsalis pulse intact bilaterally: Yes Comments Thick yellow nails      Lab Results  Component Value Date   WBC 14.2 (H) 01/23/2023   HGB 16.2 01/23/2023   HCT 48.7 01/23/2023   PLT 350 01/23/2023   GLUCOSE 155 (H) 01/23/2023   CHOL 164 01/30/2023   TRIG 135 01/30/2023   HDL 43 01/30/2023   LDLCALC 97 01/30/2023   ALT 16 01/23/2023   AST 23 01/23/2023   NA 133 (L) 01/23/2023   K 4.6 01/23/2023   CL 95 (L) 01/23/2023   CREATININE 1.07 01/23/2023   BUN 16 01/23/2023   CO2 27 01/23/2023   TSH 2.028 09/11/2022   INR 1.1 09/11/2022   HGBA1C 6.1 (H) 01/30/2023   MICROALBUR 10 04/11/2021      Assessment & Plan:    Hypertensive heart disease without heart failure Assessment & Plan:  Well controlled.  No changes to medicines. Continue lasix 20 mg once daily as needed, aspirin 81 mg daily, and Repatha every 2 weeks.   Management per specialist.     GERD without esophagitis Assessment & Plan: Continue omeprazole 20 mg daily.    Diabetic polyneuropathy associated with type 2 diabetes mellitus (HCC) Assessment & Plan: Control: good Recommend check sugars fasting daily. Recommend check feet daily. Recommend annual eye exams. Medicines:  remain off metformin Continue to work on eating a healthy diet and exercise.  Labs drawn today.     Orders: -     Hemoglobin A1c  Mixed hyperlipidemia Assessment & Plan: Well controlled Continue with Repatha Continue to work on eating a healthy diet and exercise.  Labs drawn today.    Orders: -     Lipid panel  Other constipation Assessment & Plan: Continue miralax twice daily. Senokot as needed. Drink plenty of liquids.     Lumbar back pain Assessment & Plan: Recommend lidoderm patches otc for back.  Recommend ice for back.  Call back if wishes to go to physical therapy.   Orders: -     POCT URINALYSIS DIP (CLINITEK)     No orders of the defined types were placed in this encounter.   Orders Placed This Encounter  Procedures   Lipid panel   Hemoglobin A1c   POCT URINALYSIS DIP (CLINITEK)     Follow-up: No follow-ups on file.   I,Kerry Matthews,acting as a scribe for Blane Ohara, MD.,have documented all relevant documentation on the behalf of Blane Ohara, MD,as directed by  Blane Ohara, MD while in the presence of Blane Ohara, MD.   An After Visit Summary was printed and given to the patient.  I attest that I have reviewed this visit and agree with the plan scribed by my staff.   Blane Ohara, MD [facility] [phone]

## 2023-01-30 ENCOUNTER — Ambulatory Visit (INDEPENDENT_AMBULATORY_CARE_PROVIDER_SITE_OTHER): Payer: Medicare HMO | Admitting: Family Medicine

## 2023-01-30 ENCOUNTER — Other Ambulatory Visit: Payer: Self-pay | Admitting: Family Medicine

## 2023-01-30 ENCOUNTER — Telehealth: Payer: Self-pay | Admitting: Family Medicine

## 2023-01-30 VITALS — BP 112/78 | HR 88 | Temp 97.1°F | Ht 65.0 in | Wt 168.2 lb

## 2023-01-30 DIAGNOSIS — K219 Gastro-esophageal reflux disease without esophagitis: Secondary | ICD-10-CM

## 2023-01-30 DIAGNOSIS — I119 Hypertensive heart disease without heart failure: Secondary | ICD-10-CM

## 2023-01-30 DIAGNOSIS — K5909 Other constipation: Secondary | ICD-10-CM | POA: Diagnosis not present

## 2023-01-30 DIAGNOSIS — M545 Low back pain, unspecified: Secondary | ICD-10-CM

## 2023-01-30 DIAGNOSIS — E782 Mixed hyperlipidemia: Secondary | ICD-10-CM

## 2023-01-30 DIAGNOSIS — E1142 Type 2 diabetes mellitus with diabetic polyneuropathy: Secondary | ICD-10-CM

## 2023-01-30 LAB — POCT URINALYSIS DIP (CLINITEK)
Bilirubin, UA: NEGATIVE
Blood, UA: NEGATIVE
Glucose, UA: NEGATIVE mg/dL
Ketones, POC UA: NEGATIVE mg/dL
Leukocytes, UA: NEGATIVE
Nitrite, UA: NEGATIVE
POC PROTEIN,UA: NEGATIVE
Spec Grav, UA: 1.025 (ref 1.010–1.025)
Urobilinogen, UA: 0.2 E.U./dL
pH, UA: 6 (ref 5.0–8.0)

## 2023-01-30 LAB — LIPID PANEL
Chol/HDL Ratio: 3.8 ratio (ref 0.0–5.0)
Cholesterol, Total: 164 mg/dL (ref 100–199)
HDL: 43 mg/dL (ref >39)
LDL Chol Calc (NIH): 97 mg/dL (ref 0–99)
Triglycerides: 135 mg/dL (ref 0–149)
VLDL Cholesterol Cal: 24 mg/dL (ref 5–40)

## 2023-01-30 LAB — HEMOGLOBIN A1C
Est. average glucose Bld gHb Est-mCnc: 128 mg/dL
Hgb A1c MFr Bld: 6.1 % — ABNORMAL HIGH (ref 4.8–5.6)

## 2023-01-30 NOTE — Telephone Encounter (Signed)
Done. Dr. Sady Monaco  

## 2023-01-30 NOTE — Telephone Encounter (Signed)
Pt wife said she wants to go with [facility] in [place] for physical therapy

## 2023-01-30 NOTE — Assessment & Plan Note (Signed)
Continue miralax twice daily. Senokot as needed. Drink plenty of liquids.

## 2023-01-30 NOTE — Patient Instructions (Addendum)
Stop linzess. Begin miralax twice daily as needed for constipation.  Senokot as needed for constipation per instructions on bottle.   Neither repatha nor rituxin causes constipation. Rituxin can cause diarrhea sometimes.  Recommend lidoderm patches otc for back.  Recommend ice for back.  Call back if wishes to go to physical therapy.

## 2023-01-31 ENCOUNTER — Other Ambulatory Visit: Payer: Self-pay | Admitting: Family Medicine

## 2023-01-31 NOTE — Assessment & Plan Note (Signed)
Recommend lidoderm patches otc for back.  Recommend ice for back.  Call back if wishes to go to physical therapy.

## 2023-02-04 ENCOUNTER — Encounter: Payer: Self-pay | Admitting: Family Medicine

## 2023-02-16 DIAGNOSIS — M545 Low back pain, unspecified: Secondary | ICD-10-CM | POA: Diagnosis not present

## 2023-02-17 ENCOUNTER — Telehealth: Payer: Self-pay | Admitting: Family Medicine

## 2023-02-17 NOTE — Telephone Encounter (Signed)
[facility]

## 2023-02-18 ENCOUNTER — Other Ambulatory Visit: Payer: Self-pay | Admitting: Family Medicine

## 2023-02-19 DIAGNOSIS — M545 Low back pain, unspecified: Secondary | ICD-10-CM | POA: Diagnosis not present

## 2023-02-20 ENCOUNTER — Telehealth: Payer: Self-pay

## 2023-02-20 NOTE — Telephone Encounter (Signed)
Patient needs MRI of back per request of PT and daughter. Patient is scheduled to see Dr. Farris Has on 03/05/23 and the MRI of his back has to be done before then or they will have to re-schedule. Patient is willing to go any where to have done so it can be scheduled where ever.

## 2023-02-22 ENCOUNTER — Other Ambulatory Visit: Payer: Self-pay | Admitting: Family Medicine

## 2023-02-22 DIAGNOSIS — M545 Low back pain, unspecified: Secondary | ICD-10-CM

## 2023-02-22 DIAGNOSIS — M546 Pain in thoracic spine: Secondary | ICD-10-CM

## 2023-02-23 NOTE — Telephone Encounter (Signed)
I have printed the only one I seen since 2022 and it was done on 01/20/23 Lumbar spine xray. I have placed it on the desk in front of me.

## 2023-03-03 DIAGNOSIS — S3992XA Unspecified injury of lower back, initial encounter: Secondary | ICD-10-CM | POA: Diagnosis not present

## 2023-03-03 DIAGNOSIS — X58XXXA Exposure to other specified factors, initial encounter: Secondary | ICD-10-CM | POA: Diagnosis not present

## 2023-03-03 DIAGNOSIS — S299XXA Unspecified injury of thorax, initial encounter: Secondary | ICD-10-CM | POA: Diagnosis not present

## 2023-03-03 DIAGNOSIS — S32010A Wedge compression fracture of first lumbar vertebra, initial encounter for closed fracture: Secondary | ICD-10-CM | POA: Diagnosis not present

## 2023-03-03 DIAGNOSIS — M48061 Spinal stenosis, lumbar region without neurogenic claudication: Secondary | ICD-10-CM | POA: Diagnosis not present

## 2023-03-03 DIAGNOSIS — S22080A Wedge compression fracture of T11-T12 vertebra, initial encounter for closed fracture: Secondary | ICD-10-CM | POA: Diagnosis not present

## 2023-03-05 DIAGNOSIS — S22080A Wedge compression fracture of T11-T12 vertebra, initial encounter for closed fracture: Secondary | ICD-10-CM | POA: Diagnosis not present

## 2023-03-06 DIAGNOSIS — S22080A Wedge compression fracture of T11-T12 vertebra, initial encounter for closed fracture: Secondary | ICD-10-CM | POA: Diagnosis not present

## 2023-03-12 ENCOUNTER — Encounter: Payer: Self-pay | Admitting: Family Medicine

## 2023-03-17 ENCOUNTER — Telehealth (HOSPITAL_COMMUNITY): Payer: Self-pay

## 2023-03-17 ENCOUNTER — Other Ambulatory Visit (HOSPITAL_COMMUNITY): Payer: Self-pay | Admitting: Interventional Radiology

## 2023-03-17 ENCOUNTER — Other Ambulatory Visit: Payer: Self-pay | Admitting: Family Medicine

## 2023-03-17 DIAGNOSIS — S22080A Wedge compression fracture of T11-T12 vertebra, initial encounter for closed fracture: Secondary | ICD-10-CM

## 2023-03-17 DIAGNOSIS — S32010A Wedge compression fracture of first lumbar vertebra, initial encounter for closed fracture: Secondary | ICD-10-CM

## 2023-03-17 MED ORDER — OXYCODONE-ACETAMINOPHEN 5-325 MG PO TABS: 1.0000 | ORAL_TABLET | Freq: Four times a day (QID) | ORAL | 0 refills | Status: AC | PRN

## 2023-03-17 NOTE — Telephone Encounter (Signed)
Ok per Dr. Julieanne Cotton for T12/L1 KP. AB   Called pt to schedule, no answer, left vm. AB

## 2023-03-18 ENCOUNTER — Other Ambulatory Visit (HOSPITAL_COMMUNITY): Payer: Self-pay | Admitting: Interventional Radiology

## 2023-03-18 DIAGNOSIS — S32010A Wedge compression fracture of first lumbar vertebra, initial encounter for closed fracture: Secondary | ICD-10-CM

## 2023-03-18 DIAGNOSIS — S22080A Wedge compression fracture of T11-T12 vertebra, initial encounter for closed fracture: Secondary | ICD-10-CM

## 2023-03-19 ENCOUNTER — Ambulatory Visit (HOSPITAL_COMMUNITY)
Admission: RE | Admit: 2023-03-19 | Discharge: 2023-03-19 | Disposition: A | Discharge status: Home / Self Care | Payer: Medicare HMO | Source: Ambulatory Visit | Attending: Interventional Radiology | Admitting: Interventional Radiology

## 2023-03-19 DIAGNOSIS — M545 Low back pain, unspecified: Secondary | ICD-10-CM | POA: Diagnosis not present

## 2023-03-19 DIAGNOSIS — S32010A Wedge compression fracture of first lumbar vertebra, initial encounter for closed fracture: Secondary | ICD-10-CM

## 2023-03-19 DIAGNOSIS — S22080A Wedge compression fracture of T11-T12 vertebra, initial encounter for closed fracture: Secondary | ICD-10-CM

## 2023-03-20 HISTORY — PX: IR RADIOLOGIST EVAL & MGMT: IMG5224

## 2023-04-01 DIAGNOSIS — S32030D Wedge compression fracture of third lumbar vertebra, subsequent encounter for fracture with routine healing: Secondary | ICD-10-CM | POA: Diagnosis not present

## 2023-04-01 DIAGNOSIS — J84112 Idiopathic pulmonary fibrosis: Secondary | ICD-10-CM | POA: Diagnosis not present

## 2023-04-01 DIAGNOSIS — R5383 Other fatigue: Secondary | ICD-10-CM | POA: Diagnosis not present

## 2023-04-01 DIAGNOSIS — Z6824 Body mass index (BMI) 24.0-24.9, adult: Secondary | ICD-10-CM | POA: Diagnosis not present

## 2023-04-01 DIAGNOSIS — Z79899 Other long term (current) drug therapy: Secondary | ICD-10-CM | POA: Diagnosis not present

## 2023-04-01 DIAGNOSIS — J8410 Pulmonary fibrosis, unspecified: Secondary | ICD-10-CM | POA: Diagnosis not present

## 2023-04-01 DIAGNOSIS — M545 Low back pain, unspecified: Secondary | ICD-10-CM | POA: Diagnosis not present

## 2023-04-01 DIAGNOSIS — R768 Other specified abnormal immunological findings in serum: Secondary | ICD-10-CM | POA: Diagnosis not present

## 2023-04-01 DIAGNOSIS — M317 Microscopic polyangiitis: Secondary | ICD-10-CM | POA: Diagnosis not present

## 2023-04-08 ENCOUNTER — Ambulatory Visit: Payer: Medicare HMO | Admitting: Pulmonary Disease

## 2023-04-08 ENCOUNTER — Encounter: Payer: Self-pay | Admitting: Pulmonary Disease

## 2023-04-08 VITALS — BP 116/60 | HR 66 | Temp 97.7°F | Ht 66.0 in | Wt 164.8 lb

## 2023-04-08 DIAGNOSIS — J849 Interstitial pulmonary disease, unspecified: Secondary | ICD-10-CM | POA: Diagnosis not present

## 2023-04-08 DIAGNOSIS — Z5181 Encounter for therapeutic drug level monitoring: Secondary | ICD-10-CM

## 2023-04-08 NOTE — Patient Instructions (Signed)
Continue the Rituxan at the current dose as I would not want to reduce that We ordered a CT scan high-resolution in 4 months.  Return to clinic after CT scan.

## 2023-04-08 NOTE — Progress Notes (Signed)
Kerry Matthews    [id number]    1936-02-06  Primary Care Physician:Cox, Fritzi Mandes, MD  Referring Physician: Blane Ohara, MD [address] [place],  [place] [address]  Chief complaint: Follow up for CTD interstitial lung disease, alpha-1 antitrypsin carrier  HPI: 87 y.o. with pulmonary fibrosis on CT chest. He has complains of joint stiffness, generalized myalgias, low-grade fevers starting July 2020.  The symptoms generally respond to prednisone.  Denies any renal symptoms, difficulty swallowing.  Ruled out for COVID- 19 multiple times.  He has seen Dr. Nickola Major, Rheumatology. Serologies reviewed which show elevated p-ANCA, myeloperoxidase with elevated CRP, normal sed rate, elevated IgG4 subclass, repeat rheumatoid factor is negative. 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  Was initially on prednisone and eventually got started on Rituxan by Dr. Nickola Major from rheumatology in late 2020 with tapering off of prednisone.  Symptoms have been stable while on Rituxan  He developed COVID-19 infection on 07/29/2021 which was treated as an outpatient with Molnupiravir.  However his situation deteriorated and was admitted to [facility] on 08/12/2021 to 08/25/2021. He was diagnosed with multifocal pneumonia, Acute Respiratory failure up to 15 L.  Treated with convalescent plasma, IV steroids which was transitioned to prednisone 60 mg.  Discharged on supplemental oxygen Started on insulin due to high blood sugars by primary care.  Unable to use Bactrim prophylaxis due to allergies  He was treated with antibiotics at last visit in January 2022 for elevated WBC count and chest x-ray with persistent bilateral opacities  Received note from Dr. Zenovia Jordan 02/19/2022 Follow-up for microscopic polyangiitis, elevated rheumatoid factor.  There is no mention of hyper IgE syndrome on rheumatology  assessment  Per rheumatology assessment of microscopic polyangiitis is not contributing to pulmonary symptoms and at this point he does not repeat rituximab from rheumatology standpoint.  Pets: Has a dog.  No birds, farm animals Occupation: Worked in a [employer], Administrator.  Caretaker for cemetry.  Currently retired Exposures, Expanded ILD questionnaire 05/04/2019-reports some woodwork and gardening in the past and gardening with exposure to Roundup weed killer. No mold, hot tub, Jacuzzi, down, humidifier Smoking history: Never smoker Travel history: No significant travel history Relevant family history: 2 sisters died of pulmonary fibrosis of unclear etiology.  1 of the sisters also had COPD  Interim history:  Weaned off steroids. Started Durel Salts in July 2023.  This was poorly tolerated with fatigue, depression.  He held [facility] for a few weeks and restarted but had recurrent symptoms so discontinued altogether.  Off supplemental oxygen during the daytime.  Continues on 1 L oxygen at night He feels that he can come off oxygen at night as well  Restarted on Rituxan in November 2023 by Dr. Nickola Major from rheumatology since he could not tolerate the Ofev. Scheduled to have kyphoplasty done in the next month.  Outpatient Encounter Medications as of 04/08/2023  Medication Sig   aspirin EC 81 MG tablet Take 1 tablet (81 mg total) by mouth daily. Swallow whole.   Emollient (LUBRIDERM SERIOUSLY SENSITIVE) LOTN Apply 1 Application topically as needed (itching).   Evolocumab (REPATHA) 140 MG/ML SOSY INJECT 140 MG INTO THE SKIN EVERY 14 DAYS   furosemide (LASIX) 20 MG tablet TAKE 1 TABLET EVERY DAY AS NEEDED   gabapentin (NEURONTIN) 400 MG capsule TAKE 1 CAPSULE THREE TIMES DAILY   Multiple Vitamins-Minerals (PRESERVISION AREDS 2) CAPS Take  1 capsule by mouth 2 (two) times daily after a meal.   nitroGLYCERIN (NITROSTAT) 0.4 MG SL tablet Place 1 tablet (0.4 mg total) under the tongue every 5 (five) minutes  as needed for chest pain.   omeprazole (PRILOSEC) 20 MG capsule TAKE 1 CAPSULE EVERY DAY   polyethylene glycol (MIRALAX / GLYCOLAX) 17 g packet Take 17 g by mouth daily.   riTUXimab (RITUXAN) 500 MG/50ML injection Inject into the vein every 6 (six) months. 2 does 2 weeks apart   TRUE METRIX BLOOD GLUCOSE TEST test strip TEST BLOOD SUGAR THREE TIMES DAILY BEFORE MEALS   vitamin B-12 (CYANOCOBALAMIN) 1000 MCG tablet Take 1 tablet (1,000 mcg total) by mouth daily.   [DISCONTINUED] albuterol (VENTOLIN HFA) 108 (90 Base) MCG/ACT inhaler Inhale 2 puffs into the lungs every 6 (six) hours as needed for wheezing or shortness of breath.   No facility-administered encounter medications on file as of 04/08/2023.   Physical Exam: Blood pressure 116/60, pulse 66, temperature 97.7 F (36.5 C), temperature source Oral, height 5\' 6"  (1.676 m), weight 164 lb 12.8 oz (74.8 kg). Gen:      No acute distress HEENT:  EOMI, sclera anicteric Neck:     No masses; no thyromegaly Lungs:    Clear to auscultation bilaterally; normal respiratory effort CV:         Regular rate and rhythm; no murmurs Abd:      + bowel sounds; soft, non-tender; no palpable masses, no distension Ext:    No edema; adequate peripheral perfusion Skin:      Warm and dry; no rash Neuro: alert and oriented x 3 Psych: normal mood and affect   Data Reviewed: Imaging: CT high-resolution 04/19/2019- patchy groundglass attenuation, septal thickening with reticulation and mild bronchiectasis with basal gradient.  No definitive honeycombing Probable UIP.   CT high-resolution 10/14/2019-stable findings of interstitial lung disease, pulmonary fibrosis and probable UIP pattern  CTA 08/12/2021-no pulmonary embolism, patchy multifocal airspace disease  CTA 08/18/2021-no PE, extensive bilateral homogeneous groundglass opacities with septal thickening, fibrotic changes at the bases.  High-resolution CT 01/28/2022-pulmonary pattern of fibrosis with  progressive UIP changes.  High resolution CT 01/01/2023-stable pattern of pulmonary fibrosis.  Previously noted groundglass opacities have resolved. I have reviewed the images personally  PFTs: 05/03/2019 FVC 3.22 [92%], FEV1 2.57 [104%], F/F 80, TLC 4.85 [95%], DLCO 18.93 [85%] Minimal restriction  Labs: 04/02/2019- p-ANCA 1:160, c-ANCA negative, MPO>100, rheumatoid factor 95 CCP negative, Sjogren's antibody negative, SCL 70- negative Jo 1-negative, aldolase 6.3, CK 31  Alpha-1 antitrypsin 04/12/2019- 124, PI MZ  Overnight oximetry 03/12/2022 Time of study was 9 hours and 4 minutes Time spent less than 88% 7 hours 4 minutes Oxygen desaturation index 14.35   Overnight oximetry on room air dated 09/30/2022 Time of study 6 hours 25 minutes Nadir O2 sat of 77%.   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  He has not tolerated Ofev and is not interested in retrying antifibrotic's and would rather wait and watch.    Has been restarted on Rituxan per Dr. Nickola Major.  There is a question of whether he can go back to a lower dose of 500 mg every 6 months instead of the higher dose of 1g every 2 weeks 6 monthly.  I would recommend  that he stay at the current dose as his CT is finally stable with improvement in groundglass opacities and I worry that if he lower the dose then he may have a flareup of his interstitial lung disease again.  Will order follow-up CT in 3 to 4 months and reassess.  Nocturnal hypoxia He would like to come off supplemental oxygen at night since he feels that he does not need it.  We reviewed his overnight oximetry from February 2024 which showed significant desats.  Still he came off supplemental oxygen altogether as he does not want  to keep it.  Alpha-1 antitrypsin carrier status His A1AT levels are normal with no obstruction on pulmonary function test.  LFTs are normal Continue monitoring.  No need for treatment.  Plan/Recommendations: Follow-up CT in 3 to 4 months.  Chilton Greathouse MD [facility] 04/08/2023, 10:18 AM  CC: Blane Ohara, MD

## 2023-04-09 ENCOUNTER — Ambulatory Visit (INDEPENDENT_AMBULATORY_CARE_PROVIDER_SITE_OTHER): Payer: Medicare HMO

## 2023-04-09 VITALS — Ht 66.0 in | Wt 164.0 lb

## 2023-04-09 DIAGNOSIS — Z0000 Encounter for general adult medical examination without abnormal findings: Secondary | ICD-10-CM | POA: Diagnosis not present

## 2023-04-09 NOTE — Patient Instructions (Addendum)
[name] , Thank you for taking time to come for your Medicare Wellness Visit. I appreciate your ongoing commitment to your health goals. Please review the following plan we discussed and let me know if I can assist you in the future.   Referrals/Orders/Follow-Ups/Clinician Recommendations:   This is a list of the screening recommended for you and due dates:  Health Maintenance  Topic Date Due   DTaP/Tdap/Td vaccine (1 - Tdap) Never done   Zoster (Shingles) Vaccine (1 of 2) Never done   COVID-19 Vaccine (5 - 2023-24 season) 07/24/2022   Flu Shot  03/26/2023   Eye exam for diabetics  07/09/2023   Hemoglobin A1C  08/01/2023   Yearly kidney health urinalysis for diabetes  10/22/2023   Yearly kidney function blood test for diabetes  01/23/2024   Complete foot exam   01/30/2024   Medicare Annual Wellness Visit  04/08/2024   Pneumonia Vaccine  Completed   HPV Vaccine  Aged Out    Advanced directives: (Declined) Advance directive discussed with you today. Even though you declined this today, please call our office should you change your mind, and we can give you the proper paperwork for you to fill out.  Next Medicare Annual Wellness Visit scheduled for next year: Yes  Preventive Care 9 Years and Older, Male  Preventive care refers to lifestyle choices and visits with your health care provider that can promote health and wellness. What does preventive care include? A yearly physical exam. This is also called an annual well check. Dental exams once or twice a year. Routine eye exams. Ask your health care provider how often you should have your eyes checked. Personal lifestyle choices, including: Daily care of your teeth and gums. Regular physical activity. Eating a healthy diet. Avoiding tobacco and drug use. Limiting alcohol use. Practicing safe sex. Taking low doses of aspirin every day. Taking vitamin and mineral supplements as recommended by your health care provider. What  happens during an annual well check? The services and screenings done by your health care provider during your annual well check will depend on your age, overall health, lifestyle risk factors, and family history of disease. Counseling  Your health care provider may ask you questions about your: Alcohol use. Tobacco use. Drug use. Emotional well-being. Home and relationship well-being. Sexual activity. Eating habits. History of falls. Memory and ability to understand (cognition). Work and work Astronomer. Screening  You may have the following tests or measurements: Height, weight, and BMI. Blood pressure. Lipid and cholesterol levels. These may be checked every 5 years, or more frequently if you are over 51 years old. Skin check. Lung cancer screening. You may have this screening every year starting at age 20 if you have a 30-pack-year history of smoking and currently smoke or have quit within the past 15 years. Fecal occult blood test (FOBT) of the stool. You may have this test every year starting at age 22. Flexible sigmoidoscopy or colonoscopy. You may have a sigmoidoscopy every 5 years or a colonoscopy every 10 years starting at age 31. Prostate cancer screening. Recommendations will vary depending on your family history and other risks. Hepatitis C blood test. Hepatitis B blood test. Sexually transmitted disease (STD) testing. Diabetes screening. This is done by checking your blood sugar (glucose) after you have not eaten for a while (fasting). You may have this done every 1-3 years. Abdominal aortic aneurysm (AAA) screening. You may need this if you are a current or former smoker. Osteoporosis. You  may be screened starting at age 36 if you are at high risk. Talk with your health care provider about your test results, treatment options, and if necessary, the need for more tests. Vaccines  Your health care provider may recommend certain vaccines, such as: Influenza vaccine. This  is recommended every year. Tetanus, diphtheria, and acellular pertussis (Tdap, Td) vaccine. You may need a Td booster every 10 years. Zoster vaccine. You may need this after age 60. Pneumococcal 13-valent conjugate (PCV13) vaccine. One dose is recommended after age 49. Pneumococcal polysaccharide (PPSV23) vaccine. One dose is recommended after age 9. Talk to your health care provider about which screenings and vaccines you need and how often you need them. This information is not intended to replace advice given to you by your health care provider. Make sure you discuss any questions you have with your health care provider. Document Released: 09/07/2015 Document Revised: 04/30/2016 Document Reviewed: 06/12/2015 Elsevier Interactive Patient Education  2017 [employer].  Fall Prevention in the Home Falls can cause injuries. They can happen to people of all ages. There are many things you can do to make your home safe and to help prevent falls. What can I do on the outside of my home? Regularly fix the edges of walkways and driveways and fix any cracks. Remove anything that might make you trip as you walk through a door, such as a raised step or threshold. Trim any bushes or trees on the path to your home. Use bright outdoor lighting. Clear any walking paths of anything that might make someone trip, such as rocks or tools. Regularly check to see if handrails are loose or broken. Make sure that both sides of any steps have handrails. Any raised decks and porches should have guardrails on the edges. Have any leaves, snow, or ice cleared regularly. Use sand or salt on walking paths during winter. Clean up any spills in your garage right away. This includes oil or grease spills. What can I do in the bathroom? Use night lights. Install grab bars by the toilet and in the tub and shower. Do not use towel bars as grab bars. Use non-skid mats or decals in the tub or shower. If you need to sit down  in the shower, use a plastic, non-slip stool. Keep the floor dry. Clean up any water that spills on the floor as soon as it happens. Remove soap buildup in the tub or shower regularly. Attach bath mats securely with double-sided non-slip rug tape. Do not have throw rugs and other things on the floor that can make you trip. What can I do in the bedroom? Use night lights. Make sure that you have a light by your bed that is easy to reach. Do not use any sheets or blankets that are too big for your bed. They should not hang down onto the floor. Have a firm chair that has side arms. You can use this for support while you get dressed. Do not have throw rugs and other things on the floor that can make you trip. What can I do in the kitchen? Clean up any spills right away. Avoid walking on wet floors. Keep items that you use a lot in easy-to-reach places. If you need to reach something above you, use a strong step stool that has a grab bar. Keep electrical cords out of the way. Do not use floor polish or wax that makes floors slippery. If you must use wax, use non-skid floor wax. Do  not have throw rugs and other things on the floor that can make you trip. What can I do with my stairs? Do not leave any items on the stairs. Make sure that there are handrails on both sides of the stairs and use them. Fix handrails that are broken or loose. Make sure that handrails are as long as the stairways. Check any carpeting to make sure that it is firmly attached to the stairs. Fix any carpet that is loose or worn. Avoid having throw rugs at the top or bottom of the stairs. If you do have throw rugs, attach them to the floor with carpet tape. Make sure that you have a light switch at the top of the stairs and the bottom of the stairs. If you do not have them, ask someone to add them for you. What else can I do to help prevent falls? Wear shoes that: Do not have high heels. Have rubber bottoms. Are comfortable  and fit you well. Are closed at the toe. Do not wear sandals. If you use a stepladder: Make sure that it is fully opened. Do not climb a closed stepladder. Make sure that both sides of the stepladder are locked into place. Ask someone to hold it for you, if possible. Clearly mark and make sure that you can see: Any grab bars or handrails. First and last steps. Where the edge of each step is. Use tools that help you move around (mobility aids) if they are needed. These include: Canes. Walkers. Scooters. Crutches. Turn on the lights when you go into a dark area. Replace any light bulbs as soon as they burn out. Set up your furniture so you have a clear path. Avoid moving your furniture around. If any of your floors are uneven, fix them. If there are any pets around you, be aware of where they are. Review your medicines with your doctor. Some medicines can make you feel dizzy. This can increase your chance of falling. Ask your doctor what other things that you can do to help prevent falls. This information is not intended to replace advice given to you by your health care provider. Make sure you discuss any questions you have with your health care provider. Document Released: 06/07/2009 Document Revised: 01/17/2016 Document Reviewed: 09/15/2014 Elsevier Interactive Patient Education  2017 [employer].

## 2023-04-09 NOTE — Progress Notes (Signed)
Subjective:   Kerry Matthews is a 87 y.o. male who presents for Medicare Annual/Subsequent preventive examination.  Visit Complete: Virtual  I connected with  Kerry Matthews on 04/09/23 by a audio enabled telemedicine application and verified that I am speaking with the correct person using two identifiers.  Patient Location: Home  Provider Location: Home Office  I discussed the limitations of evaluation and management by telemedicine. The patient expressed understanding and agreed to proceed.  Patient Medicare AWV questionnaire was completed by the patient on  ; I have confirmed that all information answered by patient is correct and no changes since this date.  Review of Systems    Vital Signs: Unable to obtain new vitals due to this being a telehealth visit.  Cardiac Risk Factors include: advanced age (>75men, >31 women);male gender;diabetes mellitus;hypertension     Objective:    Today's Vitals   04/09/23 1034  Weight: 164 lb (74.4 kg)  Height: 5\' 6"  (1.676 m)   Body mass index is 26.47 kg/m.     04/09/2023   10:43 AM 09/11/2022   11:37 AM 08/13/2021    6:00 PM 08/13/2021    7:25 AM 12/17/2020    5:45 AM 12/13/2020   11:34 AM 11/08/2020    2:33 PM  Advanced Directives  Does Patient Have a Medical Advance Directive? No No No No No No No  Would patient like information on creating a medical advance directive? No - Patient declined No - Patient declined No - Patient declined No - Patient declined No - Patient declined Yes (MAU/Ambulatory/Procedural Areas - Information given) No - Patient declined    Current Medications (verified) Outpatient Encounter Medications as of 04/09/2023  Medication Sig   aspirin EC 81 MG tablet Take 1 tablet (81 mg total) by mouth daily. Swallow whole.   Emollient (LUBRIDERM SERIOUSLY SENSITIVE) LOTN Apply 1 Application topically as needed (itching).   Evolocumab (REPATHA) 140 MG/ML SOSY INJECT 140 MG INTO THE SKIN EVERY 14 DAYS   furosemide  (LASIX) 20 MG tablet TAKE 1 TABLET EVERY DAY AS NEEDED   gabapentin (NEURONTIN) 400 MG capsule TAKE 1 CAPSULE THREE TIMES DAILY   Multiple Vitamins-Minerals (PRESERVISION AREDS 2) CAPS Take 1 capsule by mouth 2 (two) times daily after a meal.   nitroGLYCERIN (NITROSTAT) 0.4 MG SL tablet Place 1 tablet (0.4 mg total) under the tongue every 5 (five) minutes as needed for chest pain.   omeprazole (PRILOSEC) 20 MG capsule TAKE 1 CAPSULE EVERY DAY   polyethylene glycol (MIRALAX / GLYCOLAX) 17 g packet Take 17 g by mouth daily.   riTUXimab (RITUXAN) 500 MG/50ML injection Inject into the vein every 6 (six) months. 2 does 2 weeks apart   TRUE METRIX BLOOD GLUCOSE TEST test strip TEST BLOOD SUGAR THREE TIMES DAILY BEFORE MEALS   vitamin B-12 (CYANOCOBALAMIN) 1000 MCG tablet Take 1 tablet (1,000 mcg total) by mouth daily.   No facility-administered encounter medications on file as of 04/09/2023.    Allergies (verified) Ace inhibitors, Hydrocodone, Hydrocodone-acetaminophen, Nsaids, Sulfamethoxazole, Sulfamethoxazole-trimethoprim, and Trimethoprim   History: 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  ANCA antibodies- myeloperoxidase ABS-greater than 100, ANCA proteinase 3-less than 3.5 04/02/2019- ANCA titers- p-ANCA +1: 160, C ANCA-less than 1: 20, atypical p-ANCA titer-less than 1: 20   Abnormal CT of the chest    Abnormal findings on diagnostic imaging of lung 04/12/2019   04/01/2019-CT chest with contrast- no acute process in chest abdomen or  pelvis, interstitial lung disease suspicious for early or mild UIP, pulmonary artery enlargement suggest PAH    Acquired trigger finger of left little finger 10/27/2022   Acute gallstone pancreatitis 09/12/2022   Adult BMI 27.0-27.9 kg/sq m 07/08/2022   Alpha-1-antitrypsin deficiency carrier 05/04/2019   Atherosclerotic heart disease of native coronary artery without angina pectoris 05/03/2018   Body mass index  (BMI) 29.0-29.9, adult 01/05/2020   Choledocholithiasis 09/11/2022   Chronic kidney disease, stage 3a (HCC) 08/12/2021   Chronic respiratory failure with hypoxia (HCC) 03/06/2022   CKD (chronic kidney disease) 05/03/2018   Coronary artery disease involving native coronary artery of native heart without angina pectoris 05/03/2018   Diabetes mellitus type 2 in obese 04/01/2019   Diabetic polyneuropathy associated with type 2 diabetes mellitus (HCC) 08/12/2021   Dyslipidemia associated with type 2 diabetes mellitus (HCC) 05/03/2018   Dysuria 09/07/2020   Elevated liver enzymes 09/19/2022   Elevated rheumatoid factor 04/12/2019   04/03/2019-rheumatoid factor-95.4   GAD (generalized anxiety disorder)    GERD without esophagitis 05/03/2018   Gouty arthritis of left great toe 07/08/2022   History of kidney stones    Hypertensive heart disease without heart failure 05/03/2018   Hyponatremia 08/12/2021   Idiopathic pulmonary fibrosis (HCC) 03/29/2021   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   Interstitial pulmonary disease (HCC) 04/12/2019   04/01/2019-CT chest with contrast- no acute process in chest abdomen or pelvis, interstitial lung disease suspicious for early or mild UIP, pulmonary artery enlargement suggest PAH  04/02/2019-connective tissue work-up: Anti-Jo 1-negative Anti-DNA antibody double-stranded-negative Anti-scleroderma antibody-negative Sjogren's syndrome antibody-negative Sjogren's syndrome antibody-negative CK-31 CCP-6 E   Leukocytosis 03/08/2020   Low vitamin B12 level 04/03/2019   Medicare annual wellness visit, subsequent 08/09/2020   Microscopic polyangiitis (HCC) 03/29/2021   Mixed diabetic hyperlipidemia associated with type 2 diabetes mellitus (HCC) 08/12/2021   Mixed hyperlipidemia 05/03/2018   Osteoarthritis    Other emphysema (HCC)    Other long term (current) drug therapy 03/29/2021   Pain in finger of left hand 11/17/2022   Peripheral polyneuropathy 01/30/2020    Pneumonia of left lower lobe due to infectious organism 10/05/2022   Polymyositis (HCC)    Recurrent UTI    Respiratory disorder concurrent with and due to microscopic polyangiitis (HCC) 08/12/2021   Rheumatoid factor positive 03/29/2021   SIRS (systemic inflammatory response syndrome) (HCC) 09/11/2022   Skin cancer    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/17/2020   Swollen L ankle 09/19/2022   Transaminitis    Type 2 diabetes mellitus with hyperglycemia, with long-term current use of insulin (HCC) 04/23/2021   UC (ulcerative colitis) (HCC)    Urinary frequency 09/19/2022   Vasculitis (HCC) 03/29/2021   Vitamin D deficiency 10/21/2022   Past Surgical History:  Procedure Laterality Date   ANGIOPLASTY  2010   BACK SURGERY     CATARACT EXTRACTION     COLONOSCOPY  06/16/2005   Mild colitis involving splenic flexure. Colonic polyps, status post polypectomy. Mild pancolonic diverticulitits. Internal hemorrhoids.    ERCP N/A 09/13/2022   Procedure: ENDOSCOPIC RETROGRADE CHOLANGIOPANCREATOGRAPHY (ERCP);  Surgeon: Iva Boop, MD;  Location: [facility];  Service: Gastroenterology;  Laterality: N/A;   ESOPHAGOGASTRODUODENOSCOPY  04/26/2003   Irregular Z line suggestive of GERD. Mild gastritis status post CLO testing.    IR RADIOLOGIST EVAL & MGMT  03/20/2023   REMOVAL OF STONES  09/13/2022   Procedure: REMOVAL OF STONES;  Surgeon: Iva Boop, MD;  Location: [facility];  Service: Gastroenterology;;  SPHINCTEROTOMY  09/13/2022   Procedure: SPHINCTEROTOMY;  Surgeon: Iva Boop, MD;  Location: [facility];  Service: Gastroenterology;;   TOTAL KNEE ARTHROPLASTY Left 12/17/2020   Procedure: LEFT TOTAL KNEE ARTHROPLASTY;  Surgeon: Tarry Kos, MD;  Location: [facility];  Service: Orthopedics;  Laterality: Left;   TRIGGER FINGER RELEASE     Family History  Problem Relation Age of Onset   Tuberculosis Mother    Stroke Father    Pancreatic cancer Sister    Heart attack Sister     Lung disease Sister    Clotting disorder Brother    Colon cancer Neg Hx    Esophageal cancer Neg Hx    Social History   Socioeconomic History   Marital status: Married    Spouse name: Not on file   Number of children: 2   Years of education: Not on file   Highest education level: 8th grade  Occupational History   Occupation: retired  Tobacco Use   Smoking status: Never   Smokeless tobacco: Never  Vaping Use   Vaping status: Never Used  Substance and Sexual Activity   Alcohol use: Never   Drug use: Never   Sexual activity: Not on file  Other Topics Concern   Not on file  Social History Narrative   Not on file   Social Determinants of Health   Financial Resource Strain: Low Risk  (04/09/2023)   Overall Financial Resource Strain (CARDIA)    Difficulty of Paying Living Expenses: Not hard at all  Food Insecurity: No Food Insecurity (04/09/2023)   Hunger Vital Sign    Worried About Running Out of Food in the Last Year: Never true    Ran Out of Food in the Last Year: Never true  Transportation Needs: No Transportation Needs (04/09/2023)   PRAPARE - Administrator, Civil Service (Medical): No    Lack of Transportation (Non-Medical): No  Physical Activity: Sufficiently Active (04/09/2023)   Exercise Vital Sign    Days of Exercise per Week: 7 days    Minutes of Exercise per Session: 30 min  Stress: No Stress Concern Present (04/09/2023)   [employer] of Occupational Health - Occupational Stress Questionnaire    Feeling of Stress : Not at all  Recent Concern: Stress - Stress Concern Present (01/20/2023)   [employer] of Occupational Health - Occupational Stress Questionnaire    Feeling of Stress : To some extent  Social Connections: Unknown (01/20/2023)   Social Connection and Isolation Panel [NHANES]    Frequency of Communication with Friends and Family: More than three times a week    Frequency of Social Gatherings with Friends and Family: Patient  declined    Attends Religious Services: More than 4 times per year    Active Member of [employer] or Organizations: Patient declined    Attends Engineer, structural: Not on file    Marital Status: Married    Tobacco Counseling Counseling given: Not Answered   Clinical Intake:  Pre-visit preparation completed: No  Pain : No/denies pain     BMI - recorded: 26.47 Nutritional Status: BMI 25 -29 Overweight Nutritional Risks: None Diabetes: Yes CBG done?: Yes CBG resulted in Enter/ Edit results?: Yes (CBG 115 Per patient) Did pt. bring in CBG monitor from home?: No  How often do you need to have someone help you when you read instructions, pamphlets, or other written materials from your doctor or pharmacy?: 1 - Never  Interpreter Needed?: No  Information  entered by :: Theresa Mulligan LPN   Activities of Daily Living    04/09/2023   10:41 AM  In your present state of health, do you have any difficulty performing the following activities:  Hearing? 0  Vision? 0  Difficulty concentrating or making decisions? 0  Walking or climbing stairs? 0  Dressing or bathing? 0  Doing errands, shopping? 0  Preparing Food and eating ? N  Using the Toilet? N  In the past six months, have you accidently leaked urine? N  Do you have problems with loss of bowel control? N  Managing your Medications? N  Comment Wife assist  Managing your Finances? N  Housekeeping or managing your Housekeeping? N    Patient Care Team: Blane Ohara, MD as PCP - General (Internal Medicine) Chilton Greathouse, MD as Consulting Physician (Pulmonary Disease) Revankar, Aundra Dubin, MD as Consulting Physician (Cardiology) Zettie Pho, [facility] (Inactive) (Pharmacist) Zenovia Jordan, MD as Consulting Physician (Rheumatology)  Indicate any recent Medical Services you may have received from other than [facility] providers in the past year (date may be approximate).     Assessment:   This is a routine wellness  examination for [place].  Hearing/Vision screen Hearing Screening - Comments:: Denies hearing difficulties   Vision Screening - Comments:: Wears rx glasses - up to date with routine eye exams with  [facility]  Dietary issues and exercise activities discussed:     Goals Addressed               This Visit's Progress     Stay Healthy (pt-stated)         Depression Screen    04/09/2023   10:40 AM 01/30/2023    8:25 AM 07/08/2022    8:31 AM 04/08/2022    9:34 AM 03/31/2022    7:43 AM 12/26/2021    8:02 AM 12/06/2021   10:40 AM  PHQ 2/9 Scores  PHQ - 2 Score 0 0 0 0 0 0 0  PHQ- 9 Score  3         Fall Risk    04/09/2023   10:42 AM 01/30/2023    8:24 AM 09/22/2022    3:45 PM 07/08/2022    8:31 AM 04/08/2022    9:35 AM  Fall Risk   Falls in the past year? 0 1 0 1 1  Number falls in past yr: 0 0 0 0 1  Injury with Fall? 0 1 0 1 1  Risk for fall due to : No Fall Risks Impaired mobility Impaired balance/gait History of fall(s) History of fall(s);Impaired balance/gait  Follow up Falls prevention discussed Falls evaluation completed;Falls prevention discussed Falls evaluation completed Falls evaluation completed Education provided;Falls prevention discussed;Falls evaluation completed  Comment     currently doing PT and OT    MEDICARE RISK AT HOME:  Medicare Risk at Home - 04/09/23 1047     Any stairs in or around the home? No    If so, are there any without handrails? No    Home free of loose throw rugs in walkways, pet beds, electrical cords, etc? Yes    Adequate lighting in your home to reduce risk of falls? Yes    Life alert? No    Use of a cane, walker or w/c? No    Grab bars in the bathroom? Yes    Shower chair or bench in shower? Yes    Elevated toilet seat or a handicapped toilet? Yes  TIMED UP AND GO:  Was the test performed?  No    Cognitive Function:    08/09/2020    1:53 PM  MMSE - Mini Mental State Exam  Orientation to time 5   Orientation to Place 5  Registration 3  Attention/ Calculation 3  Recall 3  Language- name 2 objects 2  Language- repeat 0  Language- follow 3 step command 3  Language- read & follow direction 1  Write a sentence 0  Copy design 1  Total score 26        04/09/2023   10:43 AM 04/08/2022    9:38 AM 08/09/2020    1:48 PM  6CIT Screen  What Year? 0 points 0 points 0 points  What month? 0 points 0 points 0 points  What time? 0 points 0 points 0 points  Count back from 20 0 points 4 points 4 points  Months in reverse 0 points 2 points 2 points  Repeat phrase 0 points 6 points 6 points  Total Score 0 points 12 points 12 points    Immunizations Immunization History  Administered Date(s) Administered   COVID-19, mRNA, vaccine(Comirnaty)12 years and older 05/29/2022   Fluad Quad(high Dose 65+) 05/23/2022   PFIZER(Purple Top)SARS-COV-2 Vaccination 10/17/2019, 11/07/2019, 06/05/2020   PNEUMOCOCCAL CONJUGATE-20 07/08/2022   Respiratory Syncytial Virus Vaccine,Recomb Aduvanted(Arexvy) 05/29/2022    TDAP status: Due, Education has been provided regarding the importance of this vaccine. Advised may receive this vaccine at local pharmacy or Health Dept. Aware to provide a copy of the vaccination record if obtained from local pharmacy or Health Dept. Verbalized acceptance and understanding.  Flu Vaccine status: Due, Education has been provided regarding the importance of this vaccine. Advised may receive this vaccine at local pharmacy or Health Dept. Aware to provide a copy of the vaccination record if obtained from local pharmacy or Health Dept. Verbalized acceptance and understanding.  Pneumococcal vaccine status: Up to date  Covid-19 vaccine status: Declined, Education has been provided regarding the importance of this vaccine but patient still declined. Advised may receive this vaccine at local pharmacy or Health Dept.or vaccine clinic. Aware to provide a copy of the vaccination record  if obtained from local pharmacy or Health Dept. Verbalized acceptance and understanding.  Qualifies for Shingles Vaccine? Yes   Zostavax completed No   Shingrix Completed?: No.    Education has been provided regarding the importance of this vaccine. Patient has been advised to call insurance company to determine out of pocket expense if they have not yet received this vaccine. Advised may also receive vaccine at local pharmacy or Health Dept. Verbalized acceptance and understanding.  Screening Tests Health Maintenance  Topic Date Due   DTaP/Tdap/Td (1 - Tdap) Never done   Zoster Vaccines- Shingrix (1 of 2) Never done   COVID-19 Vaccine (5 - 2023-24 season) 07/24/2022   INFLUENZA VACCINE  03/26/2023   OPHTHALMOLOGY EXAM  07/09/2023   HEMOGLOBIN A1C  08/01/2023   Diabetic kidney evaluation - Urine ACR  10/22/2023   Diabetic kidney evaluation - eGFR measurement  01/23/2024   FOOT EXAM  01/30/2024   Medicare Annual Wellness (AWV)  04/08/2024   Pneumonia Vaccine 51+ Years old  Completed   HPV VACCINES  Aged Out    Health Maintenance  Health Maintenance Due  Topic Date Due   DTaP/Tdap/Td (1 - Tdap) Never done   Zoster Vaccines- Shingrix (1 of 2) Never done   COVID-19 Vaccine (5 - 2023-24 season) 07/24/2022   INFLUENZA VACCINE  03/26/2023    Colorectal cancer screening: No longer required.   Lung Cancer Screening: (Low Dose CT Chest recommended if Age 57-80 years, 20 pack-year currently smoking OR have quit w/in 15years.) does not qualify.    Additional Screening:  Hepatitis C Screening: does not qualify; Completed   Vision Screening: Recommended annual ophthalmology exams for early detection of glaucoma and other disorders of the eye. Is the patient up to date with their annual eye exam?  Yes  Who is the provider or what is the name of the office in which the patient attends annual eye exams? [facility] If pt is not established with a provider, would they like to be  referred to a provider to establish care? No .   Dental Screening: Recommended annual dental exams for proper oral hygiene  Diabetic Foot Exam: Diabetic Foot Exam: Completed 03/01/23  Community Resource Referral / Chronic Care Management:  CRR required this visit?  No   CCM required this visit?  No     Plan:     I have personally reviewed and noted the following in the patient's chart:   Medical and social history Use of alcohol, tobacco or illicit drugs  Current medications and supplements including opioid prescriptions. Patient is not currently taking opioid prescriptions. Functional ability and status Nutritional status Physical activity Advanced directives List of other physicians Hospitalizations, surgeries, and ER visits in previous 12 months Vitals Screenings to include cognitive, depression, and falls Referrals and appointments  In addition, I have reviewed and discussed with patient certain preventive protocols, quality metrics, and best practice recommendations. A written personalized care plan for preventive services as well as general preventive health recommendations were provided to patient.     Tillie Rung, LPN   12/01/8117   After Visit Summary: (MyChart) Due to this being a telephonic visit, the after visit summary with patients personalized plan was offered to patient via MyChart   Nurse Notes: None

## 2023-04-15 ENCOUNTER — Ambulatory Visit: Payer: Medicare HMO | Admitting: Podiatry

## 2023-04-15 ENCOUNTER — Other Ambulatory Visit: Payer: Self-pay | Admitting: Radiology

## 2023-04-15 DIAGNOSIS — M79675 Pain in left toe(s): Secondary | ICD-10-CM

## 2023-04-15 DIAGNOSIS — M79674 Pain in right toe(s): Secondary | ICD-10-CM

## 2023-04-15 DIAGNOSIS — B351 Tinea unguium: Secondary | ICD-10-CM | POA: Diagnosis not present

## 2023-04-15 DIAGNOSIS — M549 Dorsalgia, unspecified: Secondary | ICD-10-CM

## 2023-04-15 NOTE — Progress Notes (Signed)
Subjective:  Patient ID: Kerry Matthews, male    DOB: 12-30-1935,  MRN: [id number]  Kerry Matthews presents to clinic today for:  Chief Complaint  Patient presents with   Nail Problem    Diabetic Foot Care-nail trim    Patient notes nails are thick, discolored, elongated and painful in shoegear when trying to ambulate.    PCP is Cox, Kirsten, MD. date last seen was 01/30/2023.  Last A1c on 01/30/2023 was 6.1  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  ANCA antibodies- myeloperoxidase ABS-greater than 100, ANCA proteinase 3-less than 3.5 04/02/2019- ANCA titers- p-ANCA +1: 160, C ANCA-less than 1: 20, atypical p-ANCA titer-less than 1: 20   Abnormal CT of the chest    Abnormal findings on diagnostic imaging of lung 04/12/2019   04/01/2019-CT chest with contrast- no acute process in chest abdomen or pelvis, interstitial lung disease suspicious for early or mild UIP, pulmonary artery enlargement suggest PAH    Acquired trigger finger of left little finger 10/27/2022   Acute gallstone pancreatitis 09/12/2022   Adult BMI 27.0-27.9 kg/sq m 07/08/2022   Alpha-1-antitrypsin deficiency carrier 05/04/2019   Atherosclerotic heart disease of native coronary artery without angina pectoris 05/03/2018   Body mass index (BMI) 29.0-29.9, adult 01/05/2020   Choledocholithiasis 09/11/2022   Chronic kidney disease, stage 3a (HCC) 08/12/2021   Chronic respiratory failure with hypoxia (HCC) 03/06/2022   CKD (chronic kidney disease) 05/03/2018   Coronary artery disease involving native coronary artery of native heart without angina pectoris 05/03/2018   Diabetes mellitus type 2 in obese 04/01/2019   Diabetic polyneuropathy associated with type 2 diabetes mellitus (HCC) 08/12/2021   Dyslipidemia associated with type 2 diabetes mellitus (HCC) 05/03/2018   Dysuria 09/07/2020   Elevated liver enzymes 09/19/2022   Elevated rheumatoid factor 04/12/2019    04/03/2019-rheumatoid factor-95.4   GAD (generalized anxiety disorder)    GERD without esophagitis 05/03/2018   Gouty arthritis of left great toe 07/08/2022   History of kidney stones    Hypertensive heart disease without heart failure 05/03/2018   Hyponatremia 08/12/2021   Idiopathic pulmonary fibrosis (HCC) 03/29/2021   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   Interstitial pulmonary disease (HCC) 04/12/2019   04/01/2019-CT chest with contrast- no acute process in chest abdomen or pelvis, interstitial lung disease suspicious for early or mild UIP, pulmonary artery enlargement suggest PAH  04/02/2019-connective tissue work-up: Anti-Jo 1-negative Anti-DNA antibody double-stranded-negative Anti-scleroderma antibody-negative Sjogren's syndrome antibody-negative Sjogren's syndrome antibody-negative CK-31 CCP-6 E   Leukocytosis 03/08/2020   Low vitamin B12 level 04/03/2019   Medicare annual wellness visit, subsequent 08/09/2020   Microscopic polyangiitis (HCC) 03/29/2021   Mixed diabetic hyperlipidemia associated with type 2 diabetes mellitus (HCC) 08/12/2021   Mixed hyperlipidemia 05/03/2018   Osteoarthritis    Other emphysema (HCC)    Other long term (current) drug therapy 03/29/2021   Pain in finger of left hand 11/17/2022   Peripheral polyneuropathy 01/30/2020   Pneumonia of left lower lobe due to infectious organism 10/05/2022   Polymyositis (HCC)    Recurrent UTI    Respiratory disorder concurrent with and due to microscopic polyangiitis (HCC) 08/12/2021   Rheumatoid factor positive 03/29/2021   SIRS (systemic inflammatory response syndrome) (HCC) 09/11/2022   Skin cancer    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/17/2020   Swollen L ankle 09/19/2022   Transaminitis    Type 2 diabetes mellitus with hyperglycemia, with long-term current use of  insulin (HCC) 04/23/2021   UC (ulcerative colitis) (HCC)    Urinary frequency 09/19/2022   Vasculitis (HCC) 03/29/2021   Vitamin D  deficiency 10/21/2022    Allergies  Allergen Reactions   Ace Inhibitors Other (See Comments)    = Slow heart rate   Hydrocodone Nausea And Vomiting   Hydrocodone-Acetaminophen Nausea And Vomiting   Nsaids Other (See Comments)    Kidney issues   Sulfamethoxazole Nausea And Vomiting   Sulfamethoxazole-Trimethoprim Nausea And Vomiting   Trimethoprim Other (See Comments)    Reaction not recalled    Review of Systems: Negative except as noted in the HPI.  Objective:  There were no vitals filed for this visit.  Kerry Matthews is a pleasant 87 y.o. male in NAD. AAO x 3.  Vascular Examination: Capillary refill time is 3-5 seconds to toes bilateral. Palpable pedal pulses b/l LE. Digital hair present b/l.  Skin temperature gradient WNL b/l. No varicosities b/l. No cyanosis noted b/l.   Dermatological Examination: Pedal skin with normal turgor, texture and tone b/l. No open wounds. No interdigital macerations b/l. Toenails x10 are 3mm thick, discolored, dystrophic with subungual debris. There is pain with compression of the nail plates.  They are elongated x10     Latest Ref Rng & Units 01/30/2023    9:12 AM 10/21/2022    8:49 AM 07/08/2022    9:10 AM  Hemoglobin A1C  Hemoglobin-A1c 4.8 - 5.6 % 6.1  6.2  6.1     Assessment/Plan: 1. Pain due to onychomycosis of toenails of both feet     The mycotic toenails were sharply debrided x10 with sterile nail nippers and a power debriding burr to decrease bulk/thickness and length.    Return in about 3 months (around 07/16/2023) for [facility].   Clerance Lav, DPM, FACFAS [facility]     [address] [place], [place] [address]                Office [phone]  Fax [phone]

## 2023-04-16 ENCOUNTER — Ambulatory Visit (HOSPITAL_COMMUNITY)
Admission: RE | Admit: 2023-04-16 | Discharge: 2023-04-16 | Disposition: A | Discharge status: Home / Self Care | Payer: Medicare HMO | Source: Ambulatory Visit | Attending: Interventional Radiology | Admitting: Interventional Radiology

## 2023-04-16 ENCOUNTER — Encounter (HOSPITAL_COMMUNITY): Payer: Self-pay

## 2023-04-16 ENCOUNTER — Ambulatory Visit: Payer: Medicare HMO | Admitting: Podiatry

## 2023-04-16 ENCOUNTER — Other Ambulatory Visit: Payer: Self-pay

## 2023-04-16 DIAGNOSIS — I2510 Atherosclerotic heart disease of native coronary artery without angina pectoris: Secondary | ICD-10-CM | POA: Diagnosis not present

## 2023-04-16 DIAGNOSIS — Z794 Long term (current) use of insulin: Secondary | ICD-10-CM | POA: Insufficient documentation

## 2023-04-16 DIAGNOSIS — S22080A Wedge compression fracture of T11-T12 vertebra, initial encounter for closed fracture: Secondary | ICD-10-CM

## 2023-04-16 DIAGNOSIS — S32010A Wedge compression fracture of first lumbar vertebra, initial encounter for closed fracture: Secondary | ICD-10-CM

## 2023-04-16 DIAGNOSIS — I1310 Hypertensive heart and chronic kidney disease without heart failure, with stage 1 through stage 4 chronic kidney disease, or unspecified chronic kidney disease: Secondary | ICD-10-CM | POA: Diagnosis not present

## 2023-04-16 DIAGNOSIS — J84112 Idiopathic pulmonary fibrosis: Secondary | ICD-10-CM | POA: Insufficient documentation

## 2023-04-16 DIAGNOSIS — X58XXXA Exposure to other specified factors, initial encounter: Secondary | ICD-10-CM | POA: Insufficient documentation

## 2023-04-16 DIAGNOSIS — E1122 Type 2 diabetes mellitus with diabetic chronic kidney disease: Secondary | ICD-10-CM | POA: Insufficient documentation

## 2023-04-16 DIAGNOSIS — M549 Dorsalgia, unspecified: Secondary | ICD-10-CM

## 2023-04-16 DIAGNOSIS — M4855XA Collapsed vertebra, not elsewhere classified, thoracolumbar region, initial encounter for fracture: Secondary | ICD-10-CM | POA: Diagnosis not present

## 2023-04-16 DIAGNOSIS — Z8249 Family history of ischemic heart disease and other diseases of the circulatory system: Secondary | ICD-10-CM | POA: Diagnosis not present

## 2023-04-16 DIAGNOSIS — E1142 Type 2 diabetes mellitus with diabetic polyneuropathy: Secondary | ICD-10-CM | POA: Insufficient documentation

## 2023-04-16 DIAGNOSIS — M545 Low back pain, unspecified: Secondary | ICD-10-CM | POA: Insufficient documentation

## 2023-04-16 DIAGNOSIS — W19XXXA Unspecified fall, initial encounter: Secondary | ICD-10-CM | POA: Diagnosis not present

## 2023-04-16 DIAGNOSIS — N1831 Chronic kidney disease, stage 3a: Secondary | ICD-10-CM | POA: Diagnosis not present

## 2023-04-16 HISTORY — PX: IR KYPHO LUMBAR INC FX REDUCE BONE BX UNI/BIL CANNULATION INC/IMAGING: IMG5519

## 2023-04-16 HISTORY — PX: IR KYPHO THORACIC WITH BONE BIOPSY: IMG5518

## 2023-04-16 LAB — CBC
HCT: 41.1 % (ref 39.0–52.0)
Hemoglobin: 13.8 g/dL (ref 13.0–17.0)
MCH: 28.6 pg (ref 26.0–34.0)
MCHC: 33.6 g/dL (ref 30.0–36.0)
MCV: 85.3 fL (ref 80.0–100.0)
Platelets: 259 10*3/uL (ref 150–400)
RBC: 4.82 MIL/uL (ref 4.22–5.81)
RDW: 13.2 % (ref 11.5–15.5)
WBC: 10.3 10*3/uL (ref 4.0–10.5)
nRBC: 0 % (ref 0.0–0.2)

## 2023-04-16 LAB — BASIC METABOLIC PANEL
Anion gap: 9 (ref 5–15)
BUN: 13 mg/dL (ref 8–23)
CO2: 23 mmol/L (ref 22–32)
Calcium: 8.6 mg/dL — ABNORMAL LOW (ref 8.9–10.3)
Chloride: 105 mmol/L (ref 98–111)
Creatinine, Ser: 1.1 mg/dL (ref 0.61–1.24)
GFR, Estimated: >60 mL/min (ref >60)
Glucose, Bld: 111 mg/dL — ABNORMAL HIGH (ref 70–99)
Potassium: 4.3 mmol/L (ref 3.5–5.1)
Sodium: 137 mmol/L (ref 135–145)

## 2023-04-16 LAB — GLUCOSE, CAPILLARY
Glucose-Capillary: 113 mg/dL — ABNORMAL HIGH (ref 70–99)
Glucose-Capillary: 106 mg/dL — ABNORMAL HIGH (ref 70–99)

## 2023-04-16 LAB — PROTIME-INR
INR: 1.2 (ref 0.8–1.2)
Prothrombin Time: 15 seconds (ref 11.4–15.2)

## 2023-04-16 MED ORDER — TOBRAMYCIN SULFATE 1.2 G IJ SOLR
INTRAMUSCULAR | Status: AC
  Filled 2023-04-16: qty 1.2

## 2023-04-16 MED ORDER — FENTANYL CITRATE (PF) 100 MCG/2ML IJ SOLN
INTRAMUSCULAR | Status: AC | PRN
  Administered 2023-04-16: 25 ug via INTRAVENOUS
  Administered 2023-04-16: 12.5 ug via INTRAVENOUS
  Administered 2023-04-16: 25 ug via INTRAVENOUS
  Administered 2023-04-16: 12.5 ug via INTRAVENOUS

## 2023-04-16 MED ORDER — FENTANYL CITRATE (PF) 100 MCG/2ML IJ SOLN
INTRAMUSCULAR | Status: AC
  Filled 2023-04-16: qty 2

## 2023-04-16 MED ORDER — CEFAZOLIN SODIUM-DEXTROSE 2-4 GM/100ML-% IV SOLN
2.0000 g | INTRAVENOUS | Status: AC
  Administered 2023-04-16: 2 g via INTRAVENOUS

## 2023-04-16 MED ORDER — CEFAZOLIN SODIUM-DEXTROSE 2-4 GM/100ML-% IV SOLN
INTRAVENOUS | Status: AC
  Filled 2023-04-16: qty 100

## 2023-04-16 MED ORDER — SODIUM CHLORIDE 0.9 % IV SOLN: INTRAVENOUS | Status: AC

## 2023-04-16 MED ORDER — MIDAZOLAM HCL 2 MG/2ML IJ SOLN
INTRAMUSCULAR | Status: AC
  Filled 2023-04-16: qty 2

## 2023-04-16 MED ORDER — MIDAZOLAM HCL 2 MG/2ML IJ SOLN
INTRAMUSCULAR | Status: AC | PRN
  Administered 2023-04-16 (×2): .5 mg via INTRAVENOUS
  Administered 2023-04-16: 1 mg via INTRAVENOUS
  Administered 2023-04-16: .5 mg via INTRAVENOUS

## 2023-04-16 MED ORDER — BUPIVACAINE HCL (PF) 0.25 % IJ SOLN
INTRAMUSCULAR | Status: AC
  Filled 2023-04-16: qty 60

## 2023-04-16 MED ORDER — SODIUM CHLORIDE 0.9 % IV SOLN: INTRAVENOUS | Status: DC

## 2023-04-16 MED ORDER — IOHEXOL 300 MG/ML  SOLN
50.0000 mL | Freq: Once | INTRAMUSCULAR | Status: AC | PRN
  Administered 2023-04-16: 5 mL

## 2023-04-16 NOTE — Procedures (Signed)
INR.  Status post T12 and L1 balloon kyphoplasty.  Right transpedicular and T12, and by pedicular approach at  L1.    No acute complications.  Patient tolerated the procedure well.  Fatima Sanger MD.

## 2023-04-16 NOTE — H&P (Signed)
Chief Complaint: Patient was seen in consultation today for Thoracic 12 ans Lumbar 1 vertebroplasty/kyphoplasty    Supervising Physician: Julieanne Cotton  Patient Status: [facility] - Out-pt  History of Present Illness: Kerry Matthews is a 87 y.o. male    FULL Code status per pt Pt developed severe pain after climbing ladder 2 mo ago Low back in location Has taken OTC meds with no relief Unable to sleep--- can't get around well - as he is used to  MRI 03/03/23 revealing T12 and L1 acute fractures:  50% loss of height  Pt was seen by Dr Corliss Skains for consultation/management of acute fractures Given the patient's clinical history examination and imaging findings it was felt vertebral body augmentation at T12 and L1 would be appropriate in order to alleviate the significant pain in the thoracolumbar region, and also prevent further collapse. The procedure was described in detail to the patient and the family   Questions were answered to their satisfaction.  The patient has expressed his desire to proceed with vertebral body augmentation at T12 and L1. This will be scheduled as soon as possible  Scheduled today for T12/L1 VP/KP    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  ANCA antibodies- myeloperoxidase ABS-greater than 100, ANCA proteinase 3-less than 3.5 04/02/2019- ANCA titers- p-ANCA +1: 160, C ANCA-less than 1: 20, atypical p-ANCA titer-less than 1: 20   Abnormal CT of the chest    Abnormal findings on diagnostic imaging of lung 04/12/2019   04/01/2019-CT chest with contrast- no acute process in chest abdomen or pelvis, interstitial lung disease suspicious for early or mild UIP, pulmonary artery enlargement suggest PAH    Acquired trigger finger of left little finger 10/27/2022   Acute gallstone pancreatitis 09/12/2022   Adult BMI 27.0-27.9 kg/sq m 07/08/2022   Alpha-1-antitrypsin deficiency carrier 05/04/2019    Atherosclerotic heart disease of native coronary artery without angina pectoris 05/03/2018   Body mass index (BMI) 29.0-29.9, adult 01/05/2020   Choledocholithiasis 09/11/2022   Chronic kidney disease, stage 3a (HCC) 08/12/2021   Chronic respiratory failure with hypoxia (HCC) 03/06/2022   CKD (chronic kidney disease) 05/03/2018   Coronary artery disease involving native coronary artery of native heart without angina pectoris 05/03/2018   Diabetes mellitus type 2 in obese 04/01/2019   Diabetic polyneuropathy associated with type 2 diabetes mellitus (HCC) 08/12/2021   Dyslipidemia associated with type 2 diabetes mellitus (HCC) 05/03/2018   Dysuria 09/07/2020   Elevated liver enzymes 09/19/2022   Elevated rheumatoid factor 04/12/2019   04/03/2019-rheumatoid factor-95.4   GAD (generalized anxiety disorder)    GERD without esophagitis 05/03/2018   Gouty arthritis of left great toe 07/08/2022   History of kidney stones    Hypertensive heart disease without heart failure 05/03/2018   Hyponatremia 08/12/2021   Idiopathic pulmonary fibrosis (HCC) 03/29/2021   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   Interstitial pulmonary disease (HCC) 04/12/2019   04/01/2019-CT chest with contrast- no acute process in chest abdomen or pelvis, interstitial lung disease suspicious for early or mild UIP, pulmonary artery enlargement suggest PAH  04/02/2019-connective tissue work-up: Anti-Jo 1-negative Anti-DNA antibody double-stranded-negative Anti-scleroderma antibody-negative Sjogren's syndrome antibody-negative Sjogren's syndrome antibody-negative CK-31 CCP-6 E   Leukocytosis 03/08/2020   Low vitamin B12 level 04/03/2019   Medicare annual wellness visit, subsequent 08/09/2020   Microscopic polyangiitis (HCC) 03/29/2021   Mixed diabetic hyperlipidemia associated with type 2 diabetes mellitus (HCC) 08/12/2021   Mixed hyperlipidemia 05/03/2018   Osteoarthritis  Other emphysema (HCC)    Other long term (current)  drug therapy 03/29/2021   Pain in finger of left hand 11/17/2022   Peripheral polyneuropathy 01/30/2020   Pneumonia of left lower lobe due to infectious organism 10/05/2022   Polymyositis (HCC)    Recurrent UTI    Respiratory disorder concurrent with and due to microscopic polyangiitis (HCC) 08/12/2021   Rheumatoid factor positive 03/29/2021   SIRS (systemic inflammatory response syndrome) (HCC) 09/11/2022   Skin cancer    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/17/2020   Swollen L ankle 09/19/2022   Transaminitis    Type 2 diabetes mellitus with hyperglycemia, with long-term current use of insulin (HCC) 04/23/2021   UC (ulcerative colitis) (HCC)    Urinary frequency 09/19/2022   Vasculitis (HCC) 03/29/2021   Vitamin D deficiency 10/21/2022    Past Surgical History:  Procedure Laterality Date   ANGIOPLASTY  2010   BACK SURGERY     CATARACT EXTRACTION     COLONOSCOPY  06/16/2005   Mild colitis involving splenic flexure. Colonic polyps, status post polypectomy. Mild pancolonic diverticulitits. Internal hemorrhoids.    ERCP N/A 09/13/2022   Procedure: ENDOSCOPIC RETROGRADE CHOLANGIOPANCREATOGRAPHY (ERCP);  Surgeon: Iva Boop, MD;  Location: [facility];  Service: Gastroenterology;  Laterality: N/A;   ESOPHAGOGASTRODUODENOSCOPY  04/26/2003   Irregular Z line suggestive of GERD. Mild gastritis status post CLO testing.    IR RADIOLOGIST EVAL & MGMT  03/20/2023   REMOVAL OF STONES  09/13/2022   Procedure: REMOVAL OF STONES;  Surgeon: Iva Boop, MD;  Location: [facility];  Service: Gastroenterology;;   Dennison Mascot  09/13/2022   Procedure: Dennison Mascot;  Surgeon: Iva Boop, MD;  Location: [facility];  Service: Gastroenterology;;   TOTAL KNEE ARTHROPLASTY Left 12/17/2020   Procedure: LEFT TOTAL KNEE ARTHROPLASTY;  Surgeon: Tarry Kos, MD;  Location: [facility];  Service: Orthopedics;  Laterality: Left;   TRIGGER FINGER RELEASE      Allergies: Ace inhibitors,  Hydrocodone, Hydrocodone-acetaminophen, Nsaids, Sulfamethoxazole, Sulfamethoxazole-trimethoprim, and Trimethoprim  Medications: Prior to Admission medications   Medication Sig Start Date End Date Taking? Authorizing Provider  aspirin EC 81 MG tablet Take 1 tablet (81 mg total) by mouth daily. Swallow whole. 09/22/22  Yes Rodolph Bong, MD  Emollient (LUBRIDERM SERIOUSLY SENSITIVE) LOTN Apply 1 Application topically as needed (itching). 09/15/22  Yes Rodolph Bong, MD  gabapentin (NEURONTIN) 400 MG capsule TAKE 1 CAPSULE THREE TIMES DAILY 01/13/23  Yes Cox, Kirsten, MD  Multiple Vitamins-Minerals (PRESERVISION AREDS 2) CAPS Take 1 capsule by mouth 2 (two) times daily after a meal.   Yes [provider]  omeprazole (PRILOSEC) 20 MG capsule TAKE 1 CAPSULE EVERY DAY 01/13/23  Yes Cox, Kirsten, MD  polyethylene glycol (MIRALAX / GLYCOLAX) 17 g packet Take 17 g by mouth daily.   Yes [provider]  vitamin B-12 (CYANOCOBALAMIN) 1000 MCG tablet Take 1 tablet (1,000 mcg total) by mouth daily. 04/06/19  Yes Vann, Jessica U, DO  Evolocumab (REPATHA) 140 MG/ML SOSY INJECT 140 MG INTO THE SKIN EVERY 14 DAYS 01/31/23   Cox, Kirsten, MD  furosemide (LASIX) 20 MG tablet TAKE 1 TABLET EVERY DAY AS NEEDED 04/11/22   Cox, Kirsten, MD  nitroGLYCERIN (NITROSTAT) 0.4 MG SL tablet Place 1 tablet (0.4 mg total) under the tongue every 5 (five) minutes as needed for chest pain. 03/13/22   Revankar, Aundra Dubin, MD  riTUXimab (RITUXAN) 500 MG/50ML injection Inject into the vein every 6 (six) months. 2 does  2 weeks apart 06/18/22   [provider]  TRUE METRIX BLOOD GLUCOSE TEST test strip TEST BLOOD SUGAR THREE TIMES DAILY BEFORE MEALS 02/18/23   Cox, Fritzi Mandes, MD     Family History  Problem Relation Age of Onset   Tuberculosis Mother    Stroke Father    Pancreatic cancer Sister    Heart attack Sister    Lung disease Sister    Clotting disorder Brother    Colon cancer Neg Hx    Esophageal  cancer Neg Hx     Social History   Socioeconomic History   Marital status: Married    Spouse name: Not on file   Number of children: 2   Years of education: Not on file   Highest education level: 8th grade  Occupational History   Occupation: retired  Tobacco Use   Smoking status: Never   Smokeless tobacco: Never  Vaping Use   Vaping status: Never Used  Substance and Sexual Activity   Alcohol use: Never   Drug use: Never   Sexual activity: Not on file  Other Topics Concern   Not on file  Social History Narrative   Not on file   Social Determinants of Health   Financial Resource Strain: Low Risk  (04/09/2023)   Overall Financial Resource Strain (CARDIA)    Difficulty of Paying Living Expenses: Not hard at all  Food Insecurity: No Food Insecurity (04/09/2023)   Hunger Vital Sign    Worried About Running Out of Food in the Last Year: Never true    Ran Out of Food in the Last Year: Never true  Transportation Needs: No Transportation Needs (04/09/2023)   PRAPARE - Administrator, Civil Service (Medical): No    Lack of Transportation (Non-Medical): No  Physical Activity: Sufficiently Active (04/09/2023)   Exercise Vital Sign    Days of Exercise per Week: 7 days    Minutes of Exercise per Session: 30 min  Stress: No Stress Concern Present (04/09/2023)   [employer] of Occupational Health - Occupational Stress Questionnaire    Feeling of Stress : Not at all  Recent Concern: Stress - Stress Concern Present (01/20/2023)   [employer] of Occupational Health - Occupational Stress Questionnaire    Feeling of Stress : To some extent  Social Connections: Unknown (01/20/2023)   Social Connection and Isolation Panel [NHANES]    Frequency of Communication with Friends and Family: More than three times a week    Frequency of Social Gatherings with Friends and Family: Patient declined    Attends Religious Services: More than 4 times per year    Active Member of [employer]  or Organizations: Patient declined    Attends Engineer, structural: Not on file    Marital Status: Married    Review of Systems: A 12 point ROS discussed and pertinent positives are indicated in the HPI above.  All other systems are negative.  Review of Systems  Constitutional:  Positive for activity change. Negative for fever.  Respiratory:  Negative for cough and shortness of breath.   Cardiovascular:  Negative for chest pain.  Gastrointestinal:  Negative for abdominal pain.  Musculoskeletal:  Positive for back pain and gait problem.  Neurological:  Negative for weakness.  Psychiatric/Behavioral:  Negative for behavioral problems and confusion.     Vital Signs: BP (!) 150/74   Pulse 67   Temp 98.2 F (36.8 C) (Temporal)   Resp 20   Ht 5\' 6"  (1.676 m)  Wt 165 lb (74.8 kg)   SpO2 97%   BMI 26.63 kg/m   Advance Care Plan: The advanced care plan/surrogate decision maker was discussed at the time of visit and documented in the medical record.    Physical Exam Vitals reviewed.  HENT:     Mouth/Throat:     Mouth: Mucous membranes are moist.  Cardiovascular:     Rate and Rhythm: Normal rate and regular rhythm.     Heart sounds: Normal heart sounds.  Pulmonary:     Effort: Pulmonary effort is normal.     Breath sounds: Normal breath sounds. No wheezing.  Abdominal:     Palpations: Abdomen is soft.  Musculoskeletal:        General: Normal range of motion.     Comments: Low back pain  Skin:    General: Skin is warm.  Neurological:     Mental Status: He is alert and oriented to person, place, and time.  Psychiatric:        Behavior: Behavior normal.     Imaging: IR Radiologist Eval & Mgmt  Result Date: 03/20/2023 EXAM: NEW PATIENT OFFICE VISIT CHIEF COMPLAINT: Severe low back pain. Current Pain Level: 1-10 HISTORY OF PRESENT ILLNESS: 87 year old gentleman referred for evaluation of severe low back pain due to recently discovered compression fractures at T12  and L1. The patient is accompanied by his spouse and daughter. The patient reports the onset of severe pain in the upper lumbar region seven weeks ago after climbing up a ladder. Pain started a few hours later. He describes the pain as being severe in the upper lumbar region right from the get go. Over a period of time, there has been minimal relief from over the counter analgesics, and muscle relaxants. He describes the pain as being severe, throbbing in nature, without radiation circumferentially, or in the lower extremities. Patient is barely able to ambulate. Feels extremely uncomfortable while lying or sitting for prolonged periods of time. He is unable to sleep due to the severe pain. He denies any autonomic dysfunction of his bowel or bladder. Does complain of constipation since the onset of the fractures. He denies recent chills, fever or rigors. Review of systems: He denies any recent chest pain, shortness of breath or pedal edema. Presently denies productive cough or hemoptysis. He is able to maintain a good appetite and is able to handle liquids and solids. Denies any abdominal pain, diarrhea or melena. Denies recent dysuria, hematuria or polyuria. Diagnosis * : Date . * : Abnormal ANCA test * : 04/12/2019 * : 04/02/2019- MPO/PR-3 ANCA antibodies- myeloperoxidase ABS-greater than 100, ANCA proteinase 3-less than 3.5 04/02/2019- ANCA titers- p-ANCA +1: 160, C ANCA-less than 1: 20, atypical p-ANCA titer-less than 1: 20 . * : Abnormal CT of the chest * : . * : Abnormal findings on diagnostic imaging of lung * : 04/12/2019 * : 04/01/2019-CT chest with contrast- no acute process in chest abdomen or pelvis, interstitial lung disease suspicious for early or mild UIP, pulmonary artery enlargement suggest PAH . * : Acquired trigger finger of left little finger * : 10/27/2022 . * : Acute gallstone pancreatitis * : 09/12/2022 . * : Adult BMI 27.0-27.9 kg/sq m * : 07/08/2022 . * : Alpha-1-antitrypsin deficiency carrier * :  05/04/2019 . * : Atherosclerotic heart disease of native coronary artery without angina pectoris * : 05/03/2018 . * : Body mass index (BMI) 29.0-29.9, adult * : 01/05/2020 . * : Choledocholithiasis * : 09/11/2022 . * :  Chronic kidney disease, stage 3a (HCC) * : 08/12/2021 . * : Chronic respiratory failure with hypoxia (HCC) * : 03/06/2022 . * : CKD (chronic kidney disease) * : 05/03/2018 . * : Coronary artery disease involving native coronary artery of native heart without angina pectoris * : 05/03/2018 . * : Diabetes mellitus type 2 in obese * : 04/01/2019 . * : Diabetic polyneuropathy associated with type 2 diabetes mellitus (HCC) * : 08/12/2021 . * : Dyslipidemia associated with type 2 diabetes mellitus (HCC) * : 05/03/2018 . * : Dysuria * : 09/07/2020 . * : Elevated liver enzymes * : 09/19/2022 . * : Elevated rheumatoid factor * : 04/12/2019 * : 04/03/2019-rheumatoid factor-95.4 . * : GAD (generalized anxiety disorder) * : . * : GERD without esophagitis * : 05/03/2018 . * : Gouty arthritis of left great toe * : 07/08/2022 . * : History of kidney stones * : . * : Hypertensive heart disease without heart failure * : 05/03/2018 . * : Hyponatremia * : 08/12/2021 . * : Idiopathic pulmonary fibrosis (HCC) * : 03/29/2021 . * : IgG4-related sclerosing disease * : 03/29/2021 . * : Interstitial pulmonary disease (HCC) * : 04/12/2019 * : 04/01/2019-CT chest with contrast- no acute process in chest abdomen or pelvis, interstitial lung disease suspicious for early or mild UIP, pulmonary artery enlargement suggest PAH 04/02/2019-connective tissue work-up: Anti-Jo 1-negative Anti-DNA antibody double-stranded-negative Anti-scleroderma antibody-negative Sjogren's syndrome antibody-negative Sjogren's syndrome antibody-negative CK-31 CCP-6 E . * : Leukocytosis * : 03/08/2020 . * : Low vitamin B12 level * : 04/03/2019 . * : Medicare annual wellness visit, subsequent * : 08/09/2020 . * : Microscopic polyangiitis (HCC) * : 03/29/2021 . * :  Mixed diabetic hyperlipidemia associated with type 2 diabetes mellitus (HCC) * : 08/12/2021 . * : Mixed hyperlipidemia * : 05/03/2018 . * : Osteoarthritis * : . * : Other emphysema (HCC) * : . * : Other long term (current) drug therapy * : 03/29/2021 . * : Pain in finger of left hand * : 11/17/2022 . * : Peripheral polyneuropathy * : 01/30/2020 . * : Pneumonia of left lower lobe due to infectious organism * : 10/05/2022 . * : Polymyositis (HCC) * : . * : Recurrent UTI * : . * : Respiratory disorder concurrent with and due to microscopic polyangiitis (HCC) * : 08/12/2021 . * : Rheumatoid factor positive * : 03/29/2021 . * : SIRS (systemic inflammatory response syndrome) (HCC) * : 09/11/2022 . * : Skin cancer * : . * : Status post total left knee replacement * : 12/17/2020 . * : Swollen L ankle * : 09/19/2022 . * : Transaminitis * : . * : Type 2 diabetes mellitus with hyperglycemia, with long-term current use of insulin (HCC) * : 04/23/2021 . * : UC (ulcerative colitis) (HCC) * : . * : Urinary frequency * : 09/19/2022 . * : Vasculitis (HCC) * : 03/29/2021 . * : Vitamin D deficiency * : 10/21/2022 : Past Surgical History: Procedure * : Laterality * : Date . * : ANGIOPLASTY * : * : 2010 . * : BACK SURGERY * : * : . * : CATARACT EXTRACTION * : * : . * : COLONOSCOPY * : * : 06/16/2005 * : Mild colitis involving splenic flexure. Colonic polyps, status post polypectomy. Mild pancolonic diverticulitits. Internal hemorrhoids. . * : ERCP * : N/A * : 09/13/2022 * : Procedure: ENDOSCOPIC RETROGRADE CHOLANGIOPANCREATOGRAPHY (ERCP); Surgeon: Iva Boop,  MD; Location: [facility]; Service: Gastroenterology; Laterality: N/A; . * : ESOPHAGOGASTRODUODENOSCOPY * : * : 04/26/2003 * : Irregular Z line suggestive of GERD. Mild gastritis status post CLO testing. . * : REMOVAL OF STONES * : * : 09/13/2022 * : Procedure: REMOVAL OF STONES; Surgeon: Iva Boop, MD; Location: [facility]; Service: Gastroenterology;; . * :  SPHINCTEROTOMY * : * : 09/13/2022 * : Procedure: SPHINCTEROTOMY; Surgeon: Iva Boop, MD; Location: [facility]; Service: Gastroenterology;; . * : TOTAL KNEE ARTHROPLASTY * : Left * : 12/17/2020 * : Procedure: LEFT TOTAL KNEE ARTHROPLASTY; Surgeon: Tarry Kos, MD; Location: [facility]; Service: Orthopedics; Laterality: Left; . * : TRIGGER FINGER RELEASE * : * : Family History Problem * : Relation * : Age of Onset . * : Tuberculosis * : Mother * : . * : Stroke * : Father * : . * : Pancreatic cancer * : Sister * : . * : Heart attack * : Sister * : . * : Lung disease * : Sister * : . * : Clotting disorder * : Brother * : . * : Colon cancer * : Neg Hx * : . * : Esophageal cancer * : Neg Hx * : Socioeconomic History . * : Marital status: * : Married * : * : Spouse name: * : Not on file . * : Number of children: * : 2 . * : Years of education: * : Not on file . * : Highest education level: * : 8th grade Occupational History . * : Occupation: * : retired Tobacco Use . * : Smoking status: * : Never . * : Smokeless tobacco: * : Never Vaping Use . * : Vaping Use: * : Never used Substance and Sexual Activity . * : Alcohol use: * : Never . * : Drug use: * : Never . * : Sexual activity: * : Not on file Other Topics * : Concern . * : Not on file Social History Narrative . * : Not on file Financial Resource Strain: Low Risk  (01/20/2023) * : Overall Financial Resource Strain (CARDIA) * : . * : Difficulty of Paying Living Expenses: Not hard at all Food Insecurity: No Food Insecurity (01/20/2023) * : Hunger Vital Sign * : . * : Worried About Running Out of Food in the Last Year: Never true * : . * : Ran Out of Food in the Last Year: Never true Transportation Needs: No Transportation Needs (01/20/2023) * : PRAPARE - Transportation * : . * : Lack of Transportation (Medical): No * : . * : Lack of Transportation (Non-Medical): No Physical Activity: Unknown (01/20/2023) * : Exercise Vital Sign * : . * : Days of Exercise per Week: Patient  declined * : . * : Minutes of Exercise per Session: Not on file Stress: Stress Concern Present (01/20/2023) * : [employer] - Occupational Stress Questionnaire * : . * : Feeling of Stress : To some extent Social Connections: Unknown (01/20/2023) * : Social Connection and Isolation Panel NHANES * : . * : Frequency of Communication with Friends and Family: More than three times a week * : . * : Frequency of Social Gatherings with Friends and Family: Patient declined * : . * : Attends Religious Services: More than 4 times per year * : . * : Active Member of Clubs or Organizations: Patient declined * : . * : Attends Banker Meetings:  Not on file * : . * : Marital Status: Married Medications: Medication * : Sig * : Dispense * : Refill . * : aspirin EC 81 MG tablet * : Take 1 tablet (81 mg total) by mouth daily. Swallow whole. * : 30 tablet * : 11 . * : Emollient (LUBRIDERM SERIOUSLY SENSITIVE) LOTN * : Apply 1 Application topically as needed (itching). * : * : 0 . * : furosemide (LASIX) 20 MG tablet * : TAKE 1 TABLET EVERY DAY AS NEEDED * : 90 tablet * : 1 . * : gabapentin (NEURONTIN) 400 MG capsule * : TAKE 1 CAPSULE THREE TIMES DAILY * : 270 capsule * : 1 . * : glucose blood (TRUE METRIX BLOOD GLUCOSE TEST) test strip * : TEST BLOOD SUGAR THREE TIMES DAILY BEFORE MEALS * : 300 strip * : 3 . * : Multiple Vitamins-Minerals (PRESERVISION AREDS 2) CAPS * : Take 1 capsule by mouth 2 (two) times daily after a meal. * : * : . * : nitroGLYCERIN (NITROSTAT) 0.4 MG SL tablet * : Place 1 tablet (0.4 mg total) under the tongue every 5 (five) minutes as needed for chest pain. * : 25 tablet * : 9 . * : omeprazole (PRILOSEC) 20 MG capsule * : TAKE 1 CAPSULE EVERY DAY * : 90 capsule * : 1 . * : polyethylene glycol (MIRALAX / GLYCOLAX) 17 g packet * : Take 17 g by mouth daily. * : * : . * : riTUXimab (RITUXAN) 500 MG/50ML injection * : Inject into the vein every 6 (six) months. * : * : . * :  vitamin B-12 (CYANOCOBALAMIN) 1000 MCG tablet * : Take 1 tablet (1,000 mcg total) by mouth daily. * : 30 tablet * : 0 REVIEW OF SYSTEMS: Negative unless as mentioned above. PHYSICAL EXAMINATION: Appears alert, awake, oriented to time, place, space. Appears in moderate to significant distress sitting in a wheelchair secondary to the pain. Speech and comprehension intact. No gross abnormal lateralizing neurological features evident. Station and gait not tested. Patient is moderately tender at the thoracolumbar level on palpation. ASSESSMENT AND PLAN: The patient's recent MRI of the lumbosacral spine was reviewed with the patient, the daughter and the spouse. Brought to their attention was the abnormal changes on MRI at T12-L1 suggestive of an acute to subacute fracture. No evidence of retropulsion evident. Also noted on the MRI is significant degenerative disc disease with disc space narrowing at L2-L3, L3-L4 and L4-L5. Bilateral facet arthropathy evident at L4 and L5 with significant narrowing of the right-sided L5-S1 foramen resulting in near contact with the L5 nerve root. Given the patient's clinical history examination and imaging findings it was felt vertebral body augmentation at T12 and L1 would be appropriate in order to alleviate the significant pain in the thoracolumbar region, and also prevent further collapse. The procedure was described in detail to the patient and the family. The procedure would be under moderate sedation on an outpatient basis. Potential, though very low risk of bleeding at the site of the puncture, and infection were discussed, and ways to mitigate these from occurring were also reviewed. Questions were answered to their satisfaction. The patient has expressed his desire to proceed with vertebral body augmentation at T12 and L1. This will be scheduled as soon as possible. Electronically Signed   By: Julieanne Cotton M.D.   On: 03/20/2023 10:17    Labs:  CBC: Recent Labs     09/22/22 1535 10/21/22  [id number] 01/23/23 2237 04/16/23 0731  WBC 10.7 11.3* 14.2* 10.3  HGB 13.6 15.3 16.2 13.8  HCT 40.1 46.1 48.7 41.1  PLT 369 279 350 259    COAGS: Recent Labs    09/11/22 1150 04/16/23 0731  INR 1.1 1.2    BMP: Recent Labs    09/14/22 0314 09/15/22 0403 09/19/22 1056 09/22/22 1535 10/21/22 0849 01/23/23 2237 04/16/23 0731  NA 133* 137   < > 140 140 133* 137  K 4.4 4.3   < > 4.9 4.3 4.6 4.3  CL 104 105   < > 102 103 95* 105  CO2 25 27   < > 22 21 27 23   GLUCOSE 133* 105*   < > 112* 113* 155* 111*  BUN 16 19   < > 14 18 16 13   CALCIUM 8.3* 8.2*   < > 8.9 9.2 9.0 8.6*  CREATININE 1.12 1.12   < > 1.19 1.10 1.07 1.10  GFRNONAA >60 >60  --   --   --  >60 >60   < > = values in this interval not displayed.    LIVER FUNCTION TESTS: Recent Labs    09/19/22 1056 09/22/22 1535 10/21/22 0849 01/23/23 2237  BILITOT 0.9 0.6 0.8 0.7  AST 30 19 24 23   ALT 33 21 15 16   ALKPHOS 282* 236* 172* 174*  PROT 6.6 6.1 6.5 7.9  ALBUMIN 4.0 3.9 4.3 4.2    TUMOR MARKERS: No results for input(s): "AFPTM", "CEA", "CA199", "CHROMGRNA" in the last 8760 hours.  Assessment and Plan:  Scheduled for T12/L1 Vertebroplasty/kyphoplasty today Risks and benefits of Thoracic 12/Lumbar 1 vertebroplasty/kyphoplasty were discussed with the patient including, but not limited to education regarding the natural healing process of compression fractures without intervention, bleeding, infection, cement migration which may cause spinal cord damage, paralysis, pulmonary embolism or even death.  This interventional procedure involves the use of X-rays and because of the nature of the planned procedure, it is possible that we will have prolonged use of X-ray fluoroscopy.  Potential radiation risks to you include (but are not limited to) the following: - A slightly elevated risk for cancer  several years later in life. This risk is typically less than 0.5% percent. This risk is low in  comparison to the normal incidence of human cancer, which is 33% for women and 50% for men according to the [employer]. - Radiation induced injury can include skin redness, resembling a rash, tissue breakdown / ulcers and hair loss (which can be temporary or permanent).   The likelihood of either of these occurring depends on the difficulty of the procedure and whether you are sensitive to radiation due to previous procedures, disease, or genetic conditions.   IF your procedure requires a prolonged use of radiation, you will be notified and given written instructions for further action.  It is your responsibility to monitor the irradiated area for the 2 weeks following the procedure and to notify your physician if you are concerned that you have suffered a radiation induced injury.    All of the patient's questions were answered, patient is agreeable to proceed.  Consent signed and in chart.   Thank you for this interesting consult.  I greatly enjoyed meeting Francis Krygowski and look forward to participating in their care.  A copy of this report was sent to the requesting provider on this date.  Electronically Signed: Robet Leu, PA-C 04/16/2023, 8:45 AM   I spent a total of  30 Minutes   in face to face in clinical consultation, greater than 50% of which was counseling/coordinating care for T12/L1 VP/KP

## 2023-04-16 NOTE — Discharge Instructions (Signed)
INR.  1.  No stooping, or bending or lifting weights above 10 pounds for 2 weeks.  2.  Use a walker to ambulate for 2 weeks.  3.  No driving for 2 weeks.  4.  Call referring MD as needed in 2 weeks

## 2023-04-20 ENCOUNTER — Other Ambulatory Visit (HOSPITAL_COMMUNITY): Payer: Self-pay | Admitting: Interventional Radiology

## 2023-04-20 DIAGNOSIS — S22080A Wedge compression fracture of T11-T12 vertebra, initial encounter for closed fracture: Secondary | ICD-10-CM

## 2023-04-20 DIAGNOSIS — S32010A Wedge compression fracture of first lumbar vertebra, initial encounter for closed fracture: Secondary | ICD-10-CM

## 2023-04-20 HISTORY — PX: IR KYPHO EA ADDL LEVEL THORACIC OR LUMBAR: IMG5520

## 2023-04-24 ENCOUNTER — Other Ambulatory Visit: Payer: Self-pay | Admitting: Family Medicine

## 2023-05-05 DIAGNOSIS — M317 Microscopic polyangiitis: Secondary | ICD-10-CM | POA: Diagnosis not present

## 2023-05-06 ENCOUNTER — Other Ambulatory Visit: Payer: Self-pay | Admitting: Family Medicine

## 2023-05-07 DIAGNOSIS — M5440 Lumbago with sciatica, unspecified side: Secondary | ICD-10-CM | POA: Diagnosis not present

## 2023-05-07 DIAGNOSIS — S22080A Wedge compression fracture of T11-T12 vertebra, initial encounter for closed fracture: Secondary | ICD-10-CM | POA: Diagnosis not present

## 2023-05-11 ENCOUNTER — Other Ambulatory Visit: Payer: Self-pay | Admitting: Family Medicine

## 2023-05-19 DIAGNOSIS — M47816 Spondylosis without myelopathy or radiculopathy, lumbar region: Secondary | ICD-10-CM | POA: Diagnosis not present

## 2023-05-26 DIAGNOSIS — M8588 Other specified disorders of bone density and structure, other site: Secondary | ICD-10-CM | POA: Diagnosis not present

## 2023-05-26 DIAGNOSIS — E119 Type 2 diabetes mellitus without complications: Secondary | ICD-10-CM | POA: Diagnosis not present

## 2023-06-04 NOTE — Assessment & Plan Note (Signed)
Control: good Recommend check sugars fasting daily. Recommend check feet daily. Recommend annual eye exams. Medicines: remain off metformin Continue to work on eating a healthy diet and exercise.  Labs drawn today.

## 2023-06-04 NOTE — Assessment & Plan Note (Signed)
Well controlled.  No changes to medicines. Continue lasix 20 mg once daily as needed, aspirin 81 mg daily, and Repatha every 2 weeks.   Management per specialist.

## 2023-06-04 NOTE — Assessment & Plan Note (Signed)
Well controlled.  Continue with Repatha. Continue to work on eating a healthy diet and exercise.  Labs drawn today.   

## 2023-06-04 NOTE — Progress Notes (Unsigned)
Subjective:  Patient ID: Kerry Matthews, male    DOB: [birth year]  Age: 87 y.o. MRN: [id number]  Chief Complaint  Patient presents with   Medical Management of Chronic Issues    HPI   Diabetes:  Complications: neuropathy Glucose checking: 1-2 times per day.  Glucose logs: 87-143 Hypoglycemia: none Current medications: Taking Gabapentin 400 mg 1 capsule BID daily. Last Eye Exam: 06/2022. Foot checks: daily.    Hyperlipidemia: Current medications: Patient is taking Repatha. Held repatha 6 weeks ago because he was concerned it was contributing to your back pain.   Hypertensive heart disease. Has history of stent  Current medications: lasix 20 mg once daily as needed, aspirin 81 mg daily, on Ntg.    Diet: eating healthy. Exercise: home physical therapy and OT.      Microscopic polyangitis: pulmonary and rheumatology. on rituxin 100 mg every 6 months.  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 Sees pulmonology. No longer needing oxygen.   B12 deficiency: otc b12 1000 mcg once daily.   Vitamin D deficiency: taking vitamin D 50,000 take 1 capsule once weekly.       06/05/2023    8:19 AM 04/09/2023   10:40 AM 01/30/2023    8:25 AM 07/08/2022    8:31 AM 04/08/2022    9:34 AM  Depression screen PHQ 2/9  Decreased Interest 0 0 0 0 0  Down, Depressed, Hopeless 0 0 0 0 0  PHQ - 2 Score 0 0 0 0 0  Altered sleeping   1    Tired, decreased energy   1    Change in appetite   1    Feeling bad or failure about yourself    0    Trouble concentrating   0    Moving slowly or fidgety/restless   0    Suicidal thoughts   0    PHQ-9 Score   3    Difficult doing work/chores   Somewhat difficult          06/05/2023    8:19 AM  Fall Risk   Falls in the past year? 1  Number falls in past yr: 0  Injury with Fall? 1  Risk for fall due to : History of fall(s)  Follow up Falls evaluation completed;Falls prevention discussed    Patient Care Team: Blane Ohara, MD  as PCP - General (Internal Medicine) Chilton Greathouse, MD as Consulting Physician (Pulmonary Disease) Revankar, Aundra Dubin, MD as Consulting Physician (Cardiology) Zettie Pho, [facility] (Inactive) (Pharmacist) Zenovia Jordan, MD as Consulting Physician (Rheumatology)   Review of Systems  Constitutional:  Negative for chills and fever.  HENT:  Negative for congestion, rhinorrhea and sore throat.   Respiratory:  Positive for cough and shortness of breath.   Cardiovascular:  Negative for chest pain and palpitations.  Gastrointestinal:  Negative for abdominal pain, constipation, diarrhea, nausea and vomiting.  Genitourinary:  Negative for dysuria and urgency.  Musculoskeletal:  Positive for back pain. Negative for arthralgias and myalgias.  Neurological:  Negative for dizziness and headaches.  Psychiatric/Behavioral:  Negative for dysphoric mood. The patient is not nervous/anxious.     Current Outpatient Medications on File Prior to Visit  Medication Sig Dispense Refill   Alcohol Swabs (DROPSAFE ALCOHOL PREP) 70 % PADS USE IN THE MORNING, AT NOON, IN THE EVENING AND AT BEDTIME. 400 each 3   aspirin EC 81 MG tablet Take 1 tablet (81 mg total) by mouth daily. Swallow whole. 30 tablet 11   Emollient (LUBRIDERM SERIOUSLY  SENSITIVE) LOTN Apply 1 Application topically as needed (itching).  0   Evolocumab (REPATHA) 140 MG/ML SOSY INJECT 140MG  INTO THE SKIN EVERY 14 DAYS 6 mL 0   furosemide (LASIX) 20 MG tablet TAKE 1 TABLET EVERY DAY AS NEEDED 90 tablet 1   gabapentin (NEURONTIN) 400 MG capsule TAKE 1 CAPSULE THREE TIMES DAILY 270 capsule 1   Multiple Vitamins-Minerals (PRESERVISION AREDS 2) CAPS Take 1 capsule by mouth 2 (two) times daily after a meal.     nitroGLYCERIN (NITROSTAT) 0.4 MG SL tablet Place 1 tablet (0.4 mg total) under the tongue every 5 (five) minutes as needed for chest pain. 25 tablet 9   omeprazole (PRILOSEC) 20 MG capsule TAKE 1 CAPSULE EVERY DAY 90 capsule 3   polyethylene glycol  (MIRALAX / GLYCOLAX) 17 g packet Take 17 g by mouth daily.     riTUXimab (RITUXAN) 500 MG/50ML injection Inject into the vein every 6 (six) months. 2 does 2 weeks apart     TRUE METRIX BLOOD GLUCOSE TEST test strip TEST BLOOD SUGAR THREE TIMES DAILY BEFORE MEALS 300 strip 3   vitamin B-12 (CYANOCOBALAMIN) 1000 MCG tablet Take 1 tablet (1,000 mcg total) by mouth daily. 30 tablet 0   No current facility-administered medications on file prior to visit.   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  ANCA antibodies- myeloperoxidase ABS-greater than 100, ANCA proteinase 3-less than 3.5 04/02/2019- ANCA titers- p-ANCA +1: 160, C ANCA-less than 1: 20, atypical p-ANCA titer-less than 1: 20   Abnormal CT of the chest    Abnormal findings on diagnostic imaging of lung 04/12/2019   04/01/2019-CT chest with contrast- no acute process in chest abdomen or pelvis, interstitial lung disease suspicious for early or mild UIP, pulmonary artery enlargement suggest PAH    Acquired trigger finger of left little finger 10/27/2022   Acute gallstone pancreatitis 09/12/2022   Adult BMI 27.0-27.9 kg/sq m 07/08/2022   Alpha-1-antitrypsin deficiency carrier 05/04/2019   Atherosclerotic heart disease of native coronary artery without angina pectoris 05/03/2018   Body mass index (BMI) 29.0-29.9, adult 01/05/2020   Choledocholithiasis 09/11/2022   Chronic kidney disease, stage 3a (HCC) 08/12/2021   Chronic respiratory failure with hypoxia (HCC) 03/06/2022   CKD (chronic kidney disease) 05/03/2018   Coronary artery disease involving native coronary artery of native heart without angina pectoris 05/03/2018   Diabetes mellitus type 2 in obese 04/01/2019   Diabetic polyneuropathy associated with type 2 diabetes mellitus (HCC) 08/12/2021   Dyslipidemia associated with type 2 diabetes mellitus (HCC) 05/03/2018   Dysuria 09/07/2020   Elevated liver enzymes 09/19/2022   Elevated  rheumatoid factor 04/12/2019   04/03/2019-rheumatoid factor-95.4   GAD (generalized anxiety disorder)    GERD without esophagitis 05/03/2018   Gouty arthritis of left great toe 07/08/2022   History of kidney stones    Hypertensive heart disease without heart failure 05/03/2018   Hyponatremia 08/12/2021   Idiopathic pulmonary fibrosis (HCC) 03/29/2021   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   Interstitial pulmonary disease (HCC) 04/12/2019   04/01/2019-CT chest with contrast- no acute process in chest abdomen or pelvis, interstitial lung disease suspicious for early or mild UIP, pulmonary artery enlargement suggest PAH  04/02/2019-connective tissue work-up: Anti-Jo 1-negative Anti-DNA antibody double-stranded-negative Anti-scleroderma antibody-negative Sjogren's syndrome antibody-negative Sjogren's syndrome antibody-negative CK-31 CCP-6 E   Leukocytosis 03/08/2020   Low vitamin B12 level 04/03/2019   Medicare annual wellness visit, subsequent 08/09/2020   Microscopic polyangiitis (HCC) 03/29/2021  Mixed diabetic hyperlipidemia associated with type 2 diabetes mellitus (HCC) 08/12/2021   Mixed hyperlipidemia 05/03/2018   Osteoarthritis    Other emphysema (HCC)    Other long term (current) drug therapy 03/29/2021   Pain in finger of left hand 11/17/2022   Peripheral polyneuropathy 01/30/2020   Pneumonia of left lower lobe due to infectious organism 10/05/2022   Polymyositis (HCC)    Recurrent UTI    Respiratory disorder concurrent with and due to microscopic polyangiitis (HCC) 08/12/2021   Rheumatoid factor positive 03/29/2021   SIRS (systemic inflammatory response syndrome) (HCC) 09/11/2022   Skin cancer    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/17/2020   Swollen L ankle 09/19/2022   Transaminitis    Type 2 diabetes mellitus with hyperglycemia, with long-term current use of insulin (HCC) 04/23/2021   UC (ulcerative colitis) (HCC)    Urinary frequency 09/19/2022   Vasculitis (HCC)  03/29/2021   Vitamin D deficiency 10/21/2022   Past Surgical History:  Procedure Laterality Date   ANGIOPLASTY  2010   BACK SURGERY     CATARACT EXTRACTION     COLONOSCOPY  06/16/2005   Mild colitis involving splenic flexure. Colonic polyps, status post polypectomy. Mild pancolonic diverticulitits. Internal hemorrhoids.    ERCP N/A 09/13/2022   Procedure: ENDOSCOPIC RETROGRADE CHOLANGIOPANCREATOGRAPHY (ERCP);  Surgeon: Iva Boop, MD;  Location: [facility];  Service: Gastroenterology;  Laterality: N/A;   ESOPHAGOGASTRODUODENOSCOPY  04/26/2003   Irregular Z line suggestive of GERD. Mild gastritis status post CLO testing.    IR KYPHO EA ADDL LEVEL THORACIC OR LUMBAR  04/20/2023   IR KYPHO LUMBAR INC FX REDUCE BONE BX UNI/BIL CANNULATION INC/IMAGING  04/16/2023   IR KYPHO THORACIC WITH BONE BIOPSY  04/16/2023   IR RADIOLOGIST EVAL & MGMT  03/20/2023   REMOVAL OF STONES  09/13/2022   Procedure: REMOVAL OF STONES;  Surgeon: Iva Boop, MD;  Location: [facility];  Service: Gastroenterology;;   Dennison Mascot  09/13/2022   Procedure: Dennison Mascot;  Surgeon: Iva Boop, MD;  Location: [facility];  Service: Gastroenterology;;   TOTAL KNEE ARTHROPLASTY Left 12/17/2020   Procedure: LEFT TOTAL KNEE ARTHROPLASTY;  Surgeon: Tarry Kos, MD;  Location: [facility];  Service: Orthopedics;  Laterality: Left;   TRIGGER FINGER RELEASE      Family History  Problem Relation Age of Onset   Tuberculosis Mother    Stroke Father    Pancreatic cancer Sister    Heart attack Sister    Lung disease Sister    Clotting disorder Brother    Colon cancer Neg Hx    Esophageal cancer Neg Hx    Social History   Socioeconomic History   Marital status: Married    Spouse name: Not on file   Number of children: 2   Years of education: Not on file   Highest education level: 8th grade  Occupational History   Occupation: retired  Tobacco Use   Smoking status: Never   Smokeless tobacco: Never   Vaping Use   Vaping status: Never Used  Substance and Sexual Activity   Alcohol use: Never   Drug use: Never   Sexual activity: Not on file  Other Topics Concern   Not on file  Social History Narrative   Not on file   Social Determinants of Health   Financial Resource Strain: Low Risk  (04/09/2023)   Overall Financial Resource Strain (CARDIA)    Difficulty of Paying Living Expenses: Not hard at all  Food Insecurity: No  Food Insecurity (04/09/2023)   Hunger Vital Sign    Worried About Running Out of Food in the Last Year: Never true    Ran Out of Food in the Last Year: Never true  Transportation Needs: No Transportation Needs (04/09/2023)   PRAPARE - Administrator, Civil Service (Medical): No    Lack of Transportation (Non-Medical): No  Physical Activity: Sufficiently Active (04/09/2023)   Exercise Vital Sign    Days of Exercise per Week: 7 days    Minutes of Exercise per Session: 30 min  Stress: No Stress Concern Present (04/09/2023)   [employer] of Occupational Health - Occupational Stress Questionnaire    Feeling of Stress : Not at all  Recent Concern: Stress - Stress Concern Present (01/20/2023)   [employer] of Occupational Health - Occupational Stress Questionnaire    Feeling of Stress : To some extent  Social Connections: Unknown (01/20/2023)   Social Connection and Isolation Panel [NHANES]    Frequency of Communication with Friends and Family: More than three times a week    Frequency of Social Gatherings with Friends and Family: Patient declined    Attends Religious Services: More than 4 times per year    Active Member of [employer] or Organizations: Patient declined    Attends Engineer, structural: Not on file    Marital Status: Married    Objective:  BP 110/74   Pulse 60   Temp (!) 95.6 F (35.3 C)   Resp 16   Ht 5\' 6"  (1.676 m)   Wt 166 lb (75.3 kg)   BMI 26.79 kg/m      06/05/2023    8:12 AM 04/16/2023    1:21 PM 04/16/2023     1:06 PM  BP/Weight  Systolic BP 110 142 145  Diastolic BP 74 69 73  Wt. (Lbs) 166    BMI 26.79 kg/m2      Physical Exam Vitals reviewed.  Constitutional:      Appearance: Normal appearance.  Neck:     Vascular: No carotid bruit.  Cardiovascular:     Rate and Rhythm: Normal rate and regular rhythm.     Pulses: Normal pulses.     Heart sounds: Murmur heard.  Pulmonary:     Effort: Pulmonary effort is normal.     Breath sounds: Rales present. No wheezing or rhonchi.  Abdominal:     General: Bowel sounds are normal.     Palpations: Abdomen is soft.     Tenderness: There is no abdominal tenderness.  Neurological:     Mental Status: He is alert.  Psychiatric:        Mood and Affect: Mood normal.        Behavior: Behavior normal.     Diabetic Foot Exam - Simple   No data filed      Lab Results  Component Value Date   WBC 10.6 06/05/2023   HGB 15.2 06/05/2023   HCT 45.4 06/05/2023   PLT 289 06/05/2023   GLUCOSE 109 (H) 06/05/2023   CHOL 193 06/05/2023   TRIG 113 06/05/2023   HDL 43 06/05/2023   LDLCALC 130 (H) 06/05/2023   ALT 15 06/05/2023   AST 22 06/05/2023   NA 139 06/05/2023   K 5.2 06/05/2023   CL 99 06/05/2023   CREATININE 1.20 06/05/2023   BUN 17 06/05/2023   CO2 23 06/05/2023   TSH 2.028 09/11/2022   INR 1.2 04/16/2023   HGBA1C 5.9 (H) 06/05/2023  MICROALBUR 10 04/11/2021      Assessment & Plan:    Hypertensive heart disease without heart failure Assessment & Plan: Well controlled.  No changes to medicines. Continue lasix 20 mg once daily as needed, aspirin 81 mg daily, and Repatha every 2 weeks.   Management per specialist.    Orders: -     CBC with Differential/Platelet -     Comprehensive metabolic panel  Atherosclerosis of native coronary artery of native heart without angina pectoris Assessment & Plan: Management per specialist   Diabetic polyneuropathy associated with type 2 diabetes mellitus (HCC) Assessment &  Plan: Control: good Recommend check sugars fasting daily. Recommend check feet daily. Recommend annual eye exams. Medicines: remain off metformin Continue to work on eating a healthy diet and exercise.  Labs drawn today.     Orders: -     Hemoglobin A1c  Mixed hyperlipidemia Assessment & Plan: Well controlled Continue with Repatha Continue to work on eating a healthy diet and exercise.  Labs drawn today.    Orders: -     Lipid panel  Encounter for immunization -     Flu Vaccine Trivalent High Dose (Fluad) -     Pfizer Comirnaty Covid-19 Vaccine 5yrs & older  Microscopic polyangiitis (HCC) Assessment & Plan: Management per specialist.   Urgency of urination Assessment & Plan: Check UA  Orders: -     POCT URINALYSIS DIP (CLINITEK)  Lumbar back pain Assessment & Plan: oxycodone/apap 5/325 mg one oral twice daily as needed severe back pain.    Other orders -     oxyCODONE-Acetaminophen; Take 1 tablet by mouth 2 (two) times daily as needed for severe pain (chronic back pain).  Dispense: 60 tablet; Refill: 0     Meds ordered this encounter  Medications   oxyCODONE-acetaminophen (PERCOCET/ROXICET) 5-325 MG tablet    Sig: Take 1 tablet by mouth 2 (two) times daily as needed for severe pain (chronic back pain).    Dispense:  60 tablet    Refill:  0    Orders Placed This Encounter  Procedures   Flu Vaccine Trivalent High Dose (Fluad)   Pfizer Comirnaty Covid-19 Vaccine 55yrs & older   CBC with Differential/Platelet   Comprehensive metabolic panel   Hemoglobin A1c   Lipid panel   POCT URINALYSIS DIP (CLINITEK)   HM DIABETES EYE EXAM     Follow-up: No follow-ups on file.   I,Marla I Leal-Borjas,acting as a scribe for Blane Ohara, MD.,have documented all relevant documentation on the behalf of Blane Ohara, MD,as directed by  Blane Ohara, MD while in the presence of Blane Ohara, MD.   An After Visit Summary was printed and given to the patient.  Blane Ohara, MD [facility] [phone]

## 2023-06-05 ENCOUNTER — Ambulatory Visit: Payer: Medicare HMO | Admitting: Family Medicine

## 2023-06-05 ENCOUNTER — Encounter: Payer: Self-pay | Admitting: Family Medicine

## 2023-06-05 VITALS — BP 110/74 | HR 60 | Temp 95.6°F | Resp 16 | Ht 66.0 in | Wt 166.0 lb

## 2023-06-05 DIAGNOSIS — E1142 Type 2 diabetes mellitus with diabetic polyneuropathy: Secondary | ICD-10-CM | POA: Diagnosis not present

## 2023-06-05 DIAGNOSIS — I2510 Atherosclerotic heart disease of native coronary artery without angina pectoris: Secondary | ICD-10-CM

## 2023-06-05 DIAGNOSIS — M317 Microscopic polyangiitis: Secondary | ICD-10-CM

## 2023-06-05 DIAGNOSIS — R3915 Urgency of urination: Secondary | ICD-10-CM | POA: Diagnosis not present

## 2023-06-05 DIAGNOSIS — Z23 Encounter for immunization: Secondary | ICD-10-CM | POA: Diagnosis not present

## 2023-06-05 DIAGNOSIS — E782 Mixed hyperlipidemia: Secondary | ICD-10-CM | POA: Diagnosis not present

## 2023-06-05 DIAGNOSIS — I119 Hypertensive heart disease without heart failure: Secondary | ICD-10-CM | POA: Diagnosis not present

## 2023-06-05 DIAGNOSIS — M545 Low back pain, unspecified: Secondary | ICD-10-CM

## 2023-06-05 LAB — POCT URINALYSIS DIP (CLINITEK)
Bilirubin, UA: NEGATIVE
Blood, UA: NEGATIVE
Glucose, UA: NEGATIVE mg/dL
Ketones, POC UA: NEGATIVE mg/dL
Leukocytes, UA: NEGATIVE
Nitrite, UA: NEGATIVE
POC PROTEIN,UA: NEGATIVE
Spec Grav, UA: 1.02 (ref 1.010–1.025)
Urobilinogen, UA: NEGATIVE U/dL — AB
pH, UA: 6 (ref 5.0–8.0)

## 2023-06-05 MED ORDER — OXYCODONE-ACETAMINOPHEN 5-325 MG PO TABS
1.0000 | ORAL_TABLET | Freq: Two times a day (BID) | ORAL | 0 refills | Status: AC | PRN
Start: 2023-06-05 — End: 2023-07-05

## 2023-06-05 NOTE — Patient Instructions (Signed)
Recommend oxycodone/apap 5/325 mg one oral twice daily as needed severe back pain. Call back and let us know if it is helping and if you need a refill.

## 2023-06-06 DIAGNOSIS — R3915 Urgency of urination: Secondary | ICD-10-CM | POA: Insufficient documentation

## 2023-06-06 LAB — CBC WITH DIFFERENTIAL/PLATELET
Basophils Absolute: 0.1 10*3/uL (ref 0.0–0.2)
Basos: 1 %
EOS (ABSOLUTE): 0.5 10*3/uL — ABNORMAL HIGH (ref 0.0–0.4)
Eos: 4 %
Hematocrit: 45.4 % (ref 37.5–51.0)
Hemoglobin: 15.2 g/dL (ref 13.0–17.7)
Immature Grans (Abs): 0 10*3/uL (ref 0.0–0.1)
Immature Granulocytes: 0 %
Lymphocytes Absolute: 3.3 10*3/uL — ABNORMAL HIGH (ref 0.7–3.1)
Lymphs: 31 %
MCH: 29.6 pg (ref 26.6–33.0)
MCHC: 33.5 g/dL (ref 31.5–35.7)
MCV: 89 fL (ref 79–97)
Monocytes Absolute: 1 10*3/uL — ABNORMAL HIGH (ref 0.1–0.9)
Monocytes: 10 %
Neutrophils Absolute: 5.7 10*3/uL (ref 1.4–7.0)
Neutrophils: 54 %
Platelets: 289 10*3/uL (ref 150–450)
RBC: 5.13 x10E6/uL (ref 4.14–5.80)
RDW: 12.9 % (ref 11.6–15.4)
WBC: 10.6 10*3/uL (ref 3.4–10.8)

## 2023-06-06 LAB — COMPREHENSIVE METABOLIC PANEL
ALT: 15 [IU]/L (ref 0–44)
AST: 22 [IU]/L (ref 0–40)
Albumin: 4.3 g/dL (ref 3.7–4.7)
Alkaline Phosphatase: 207 [IU]/L — ABNORMAL HIGH (ref 44–121)
BUN/Creatinine Ratio: 14 (ref 10–24)
BUN: 17 mg/dL (ref 8–27)
Bilirubin Total: 0.9 mg/dL (ref 0.0–1.2)
CO2: 23 mmol/L (ref 20–29)
Calcium: 9.6 mg/dL (ref 8.6–10.2)
Chloride: 99 mmol/L (ref 96–106)
Creatinine, Ser: 1.2 mg/dL (ref 0.76–1.27)
Globulin, Total: 2.4 g/dL (ref 1.5–4.5)
Glucose: 109 mg/dL — ABNORMAL HIGH (ref 70–99)
Potassium: 5.2 mmol/L (ref 3.5–5.2)
Sodium: 139 mmol/L (ref 134–144)
Total Protein: 6.7 g/dL (ref 6.0–8.5)
eGFR: 59 mL/min/{1.73_m2} — ABNORMAL LOW (ref >59)

## 2023-06-06 LAB — HEMOGLOBIN A1C
Est. average glucose Bld gHb Est-mCnc: 123 mg/dL
Hgb A1c MFr Bld: 5.9 % — ABNORMAL HIGH (ref 4.8–5.6)

## 2023-06-06 LAB — LIPID PANEL
Chol/HDL Ratio: 4.5 {ratio} (ref 0.0–5.0)
Cholesterol, Total: 193 mg/dL (ref 100–199)
HDL: 43 mg/dL (ref >39)
LDL Chol Calc (NIH): 130 mg/dL — ABNORMAL HIGH (ref 0–99)
Triglycerides: 113 mg/dL (ref 0–149)
VLDL Cholesterol Cal: 20 mg/dL (ref 5–40)

## 2023-06-06 NOTE — Assessment & Plan Note (Signed)
Management per specialist.

## 2023-06-06 NOTE — Assessment & Plan Note (Signed)
oxycodone/apap 5/325 mg one oral twice daily as needed severe back pain.

## 2023-06-06 NOTE — Assessment & Plan Note (Signed)
Check UA 

## 2023-06-08 ENCOUNTER — Encounter: Payer: Self-pay | Admitting: Family Medicine

## 2023-06-16 DIAGNOSIS — M47816 Spondylosis without myelopathy or radiculopathy, lumbar region: Secondary | ICD-10-CM | POA: Diagnosis not present

## 2023-06-16 DIAGNOSIS — S22080A Wedge compression fracture of T11-T12 vertebra, initial encounter for closed fracture: Secondary | ICD-10-CM | POA: Diagnosis not present

## 2023-06-23 DIAGNOSIS — L570 Actinic keratosis: Secondary | ICD-10-CM | POA: Diagnosis not present

## 2023-06-23 DIAGNOSIS — L578 Other skin changes due to chronic exposure to nonionizing radiation: Secondary | ICD-10-CM | POA: Diagnosis not present

## 2023-06-24 DIAGNOSIS — M47816 Spondylosis without myelopathy or radiculopathy, lumbar region: Secondary | ICD-10-CM | POA: Diagnosis not present

## 2023-06-29 DIAGNOSIS — M47816 Spondylosis without myelopathy or radiculopathy, lumbar region: Secondary | ICD-10-CM | POA: Diagnosis not present

## 2023-07-02 DIAGNOSIS — M47816 Spondylosis without myelopathy or radiculopathy, lumbar region: Secondary | ICD-10-CM | POA: Diagnosis not present

## 2023-07-06 DIAGNOSIS — M317 Microscopic polyangiitis: Secondary | ICD-10-CM | POA: Diagnosis not present

## 2023-07-08 ENCOUNTER — Telehealth: Payer: Self-pay | Admitting: *Deleted

## 2023-07-08 DIAGNOSIS — H524 Presbyopia: Secondary | ICD-10-CM | POA: Diagnosis not present

## 2023-07-08 NOTE — Telephone Encounter (Signed)
Patient called to schedule appointment but would like CT scan prior to [facility] on 09/20/2022  [facility] team members, please call and schedule appointment with Kerry Matthews (Wife) OK per DPR. [phone]

## 2023-07-16 ENCOUNTER — Ambulatory Visit: Payer: Medicare HMO | Admitting: Podiatry

## 2023-07-16 DIAGNOSIS — M79674 Pain in right toe(s): Secondary | ICD-10-CM | POA: Diagnosis not present

## 2023-07-16 DIAGNOSIS — M79675 Pain in left toe(s): Secondary | ICD-10-CM

## 2023-07-16 DIAGNOSIS — B351 Tinea unguium: Secondary | ICD-10-CM

## 2023-07-16 NOTE — Progress Notes (Signed)
Subjective:  Patient ID: Kerry Matthews, male    DOB: 12-26-1935,  MRN: [id number]  Kerry Matthews presents to clinic today for:  Chief Complaint  Patient presents with   Diabetes    DFC A1C - 5.9 BS - 128 LVPCP - 06/05/2023   Patient notes nails are thick, discolored, elongated and painful in shoegear when trying to ambulate.    PCP is Cox, Fritzi Mandes, MD.  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  ANCA antibodies- myeloperoxidase ABS-greater than 100, ANCA proteinase 3-less than 3.5 04/02/2019- ANCA titers- p-ANCA +1: 160, C ANCA-less than 1: 20, atypical p-ANCA titer-less than 1: 20   Abnormal CT of the chest    Abnormal findings on diagnostic imaging of lung 04/12/2019   04/01/2019-CT chest with contrast- no acute process in chest abdomen or pelvis, interstitial lung disease suspicious for early or mild UIP, pulmonary artery enlargement suggest PAH    Acquired trigger finger of left little finger 10/27/2022   Acute gallstone pancreatitis 09/12/2022   Adult BMI 27.0-27.9 kg/sq m 07/08/2022   Alpha-1-antitrypsin deficiency carrier 05/04/2019   Atherosclerotic heart disease of native coronary artery without angina pectoris 05/03/2018   Body mass index (BMI) 29.0-29.9, adult 01/05/2020   Choledocholithiasis 09/11/2022   Chronic kidney disease, stage 3a (HCC) 08/12/2021   Chronic respiratory failure with hypoxia (HCC) 03/06/2022   CKD (chronic kidney disease) 05/03/2018   Coronary artery disease involving native coronary artery of native heart without angina pectoris 05/03/2018   Diabetes mellitus type 2 in obese 04/01/2019   Diabetic polyneuropathy associated with type 2 diabetes mellitus (HCC) 08/12/2021   Dyslipidemia associated with type 2 diabetes mellitus (HCC) 05/03/2018   Dysuria 09/07/2020   Elevated liver enzymes 09/19/2022   Elevated rheumatoid factor 04/12/2019   04/03/2019-rheumatoid factor-95.4   GAD (generalized  anxiety disorder)    GERD without esophagitis 05/03/2018   Gouty arthritis of left great toe 07/08/2022   History of kidney stones    Hypertensive heart disease without heart failure 05/03/2018   Hyponatremia 08/12/2021   Idiopathic pulmonary fibrosis (HCC) 03/29/2021   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   Interstitial pulmonary disease (HCC) 04/12/2019   04/01/2019-CT chest with contrast- no acute process in chest abdomen or pelvis, interstitial lung disease suspicious for early or mild UIP, pulmonary artery enlargement suggest PAH  04/02/2019-connective tissue work-up: Anti-Jo 1-negative Anti-DNA antibody double-stranded-negative Anti-scleroderma antibody-negative Sjogren's syndrome antibody-negative Sjogren's syndrome antibody-negative CK-31 CCP-6 E   Leukocytosis 03/08/2020   Low vitamin B12 level 04/03/2019   Medicare annual wellness visit, subsequent 08/09/2020   Microscopic polyangiitis (HCC) 03/29/2021   Mixed diabetic hyperlipidemia associated with type 2 diabetes mellitus (HCC) 08/12/2021   Mixed hyperlipidemia 05/03/2018   Osteoarthritis    Other emphysema (HCC)    Other long term (current) drug therapy 03/29/2021   Pain in finger of left hand 11/17/2022   Peripheral polyneuropathy 01/30/2020   Pneumonia of left lower lobe due to infectious organism 10/05/2022   Polymyositis (HCC)    Recurrent UTI    Respiratory disorder concurrent with and due to microscopic polyangiitis (HCC) 08/12/2021   Rheumatoid factor positive 03/29/2021   SIRS (systemic inflammatory response syndrome) (HCC) 09/11/2022   Skin cancer    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/17/2020   Swollen L ankle 09/19/2022   Transaminitis    Type 2 diabetes mellitus with hyperglycemia, with long-term current use of insulin (HCC) 04/23/2021   UC (ulcerative  colitis) (HCC)    Urinary frequency 09/19/2022   Vasculitis (HCC) 03/29/2021   Vitamin D deficiency 10/21/2022    Past Surgical History:   Procedure Laterality Date   ANGIOPLASTY  2010   BACK SURGERY     CATARACT EXTRACTION     COLONOSCOPY  06/16/2005   Mild colitis involving splenic flexure. Colonic polyps, status post polypectomy. Mild pancolonic diverticulitits. Internal hemorrhoids.    ERCP N/A 09/13/2022   Procedure: ENDOSCOPIC RETROGRADE CHOLANGIOPANCREATOGRAPHY (ERCP);  Surgeon: Iva Boop, MD;  Location: [facility];  Service: Gastroenterology;  Laterality: N/A;   ESOPHAGOGASTRODUODENOSCOPY  04/26/2003   Irregular Z line suggestive of GERD. Mild gastritis status post CLO testing.    IR KYPHO EA ADDL LEVEL THORACIC OR LUMBAR  04/20/2023   IR KYPHO LUMBAR INC FX REDUCE BONE BX UNI/BIL CANNULATION INC/IMAGING  04/16/2023   IR KYPHO THORACIC WITH BONE BIOPSY  04/16/2023   IR RADIOLOGIST EVAL & MGMT  03/20/2023   REMOVAL OF STONES  09/13/2022   Procedure: REMOVAL OF STONES;  Surgeon: Iva Boop, MD;  Location: [facility];  Service: Gastroenterology;;   Dennison Mascot  09/13/2022   Procedure: Dennison Mascot;  Surgeon: Iva Boop, MD;  Location: [facility];  Service: Gastroenterology;;   TOTAL KNEE ARTHROPLASTY Left 12/17/2020   Procedure: LEFT TOTAL KNEE ARTHROPLASTY;  Surgeon: Tarry Kos, MD;  Location: [facility];  Service: Orthopedics;  Laterality: Left;   TRIGGER FINGER RELEASE      Allergies  Allergen Reactions   Ace Inhibitors Other (See Comments)    = Slow heart rate   Hydrocodone Nausea And Vomiting   Hydrocodone-Acetaminophen Nausea And Vomiting   Nsaids Other (See Comments)    Kidney issues   Sulfamethoxazole Nausea And Vomiting   Sulfamethoxazole-Trimethoprim Nausea And Vomiting   Trimethoprim Other (See Comments)    Reaction not recalled   Review of Systems: Negative except as noted in the HPI.  Objective:  Kerry Matthews is a pleasant 87 y.o. male in NAD. AAO x 3.  Vascular Examination: Capillary refill time is 3-5 seconds to toes bilateral. Trace palpable pedal pulses b/l LE.  Digital hair sparse b/l.  Skin temperature gradient WNL b/l. No varicosities b/l. No cyanosis noted b/l.   Dermatological Examination: Pedal skin with normal turgor, texture and tone b/l. No open wounds. No interdigital macerations b/l. Toenails x10 are 5-30mm thick, discolored, dystrophic with subungual debris. There is pain with compression of the nail plates.  They are elongated x10     Latest Ref Rng & Units 06/05/2023    8:50 AM 01/30/2023    9:12 AM 10/21/2022    8:49 AM  Hemoglobin A1C  Hemoglobin-A1c 4.8 - 5.6 % 5.9  6.1  6.2    Assessment/Plan: 1. Pain due to onychomycosis of toenails of both feet    The mycotic toenails were sharply debrided x10 with sterile nail nippers and a power debriding burr to decrease bulk/thickness and length.    Return in about 3 months (around 10/16/2023) for w/ his wife for RFC, RFC.   Clerance Lav, DPM, FACFAS [facility]     [address], [place] [address]                Office [phone])  [phone]  Fax [phone]

## 2023-07-20 DIAGNOSIS — M317 Microscopic polyangiitis: Secondary | ICD-10-CM | POA: Diagnosis not present

## 2023-08-03 ENCOUNTER — Ambulatory Visit (HOSPITAL_COMMUNITY)
Admission: RE | Admit: 2023-08-03 | Discharge: 2023-08-03 | Disposition: A | Discharge status: Home / Self Care | Payer: Medicare HMO | Source: Ambulatory Visit | Attending: Pulmonary Disease | Admitting: Pulmonary Disease

## 2023-08-03 DIAGNOSIS — J849 Interstitial pulmonary disease, unspecified: Secondary | ICD-10-CM | POA: Insufficient documentation

## 2023-08-03 DIAGNOSIS — R590 Localized enlarged lymph nodes: Secondary | ICD-10-CM | POA: Diagnosis not present

## 2023-08-03 DIAGNOSIS — J8410 Pulmonary fibrosis, unspecified: Secondary | ICD-10-CM | POA: Diagnosis not present

## 2023-09-07 NOTE — Progress Notes (Signed)
 Subjective:  Patient ID: Kerry Matthews, male    DOB: 03-03-1936  Age: 88 y.o. MRN: [id number]  Chief Complaint  Patient presents with   Medical Management of Chronic Issues    HPI The patient, with a history of diabetes, back pain, and microscopic polyangiitis, presents with two main complaints. He has been experiencing shoulder pain for the past three months, which he initially thought was due to a strain from lifting heavy objects. However, the pain has not resolved and has become so severe that he finds it difficult to move his shoulder in certain directions. The pain is located in the shoulder and sometimes radiates down the arm.  In addition to the shoulder pain, the patient has been dealing with a cough and phlegm for the same duration. The cough seems to be triggered by a sensation of drainage in his throat and is sometimes worse before bedtime. He has not tried any allergy medications or other treatments for this symptom.  The patient's diabetes appears to be well-controlled, with blood sugar levels ranging from 88 to 140. He is also taking gabapentin , Repatha  for cholesterol, Lasix  as needed, baby aspirin , and Vitamin D . He receives Rituxan  infusions every six months for microscopic polyangiitis. The patient's back pain, which was previously severe, has improved.  Diabetes:  Complications: neuropathy Glucose checking: 1-2 times per day.  Glucose logs:  88-140 Hypoglycemia: none Current medications: Taking Gabapentin  400 mg 1 capsule BID daily. Last Eye Exam: 06/2022. Foot checks: daily.    Hyperlipidemia: Current medications: Patient is taking Repatha .    Hypertensive heart disease. Has history of stent  Current medications: lasix  20 mg once daily as needed, aspirin  81 mg daily, on Ntg.    Diet: eating healthy. Exercise: home physical therapy and OT.      Microscopic polyangitis: pulmonary and rheumatology. on rituxin 500 mg every 6 months.  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 Sees pulmonology. No longer needing oxygen .   B12 deficiency: otc b12 1000 mcg once daily.   Vitamin D  deficiency: taking vitamin D  50,000 take 1 capsule once weekly.        06/05/2023    8:19 AM 04/09/2023   10:40 AM 01/30/2023    8:25 AM 07/08/2022    8:31 AM 04/08/2022    9:34 AM  Depression screen PHQ 2/9  Decreased Interest 0 0 0 0 0  Down, Depressed, Hopeless 0 0 0 0 0  PHQ - 2 Score 0 0 0 0 0  Altered sleeping   1    Tired, decreased energy   1    Change in appetite   1    Feeling bad or failure about yourself    0    Trouble concentrating   0    Moving slowly or fidgety/restless   0    Suicidal thoughts   0    PHQ-9 Score   3    Difficult doing work/chores   Somewhat difficult          06/05/2023    8:19 AM  Fall Risk   Falls in the past year? 1  Number falls in past yr: 0  Injury with Fall? 1  Risk for fall due to : History of fall(s)  Follow up Falls evaluation completed;Falls prevention discussed    Patient Care Team: Sherre Clapper, MD as PCP - General (Internal Medicine) Mannam, Praveen, MD as Consulting Physician (Pulmonary Disease) Revankar, Jennifer SAUNDERS, MD as Consulting Physician (Cardiology) Nyle Rankin POUR, [facility] (Inactive) (Pharmacist) Wallington,  Jon, MD as Consulting Physician (Rheumatology)   Review of Systems  Constitutional:  Negative for chills, diaphoresis, fatigue and fever.  HENT:  Negative for congestion, ear pain and sore throat.   Respiratory:  Positive for cough and shortness of breath (DOE).   Cardiovascular:  Negative for chest pain and leg swelling.  Gastrointestinal:  Negative for abdominal pain, constipation, diarrhea, nausea and vomiting.  Genitourinary:  Negative for dysuria and urgency.  Musculoskeletal:  Negative for arthralgias and myalgias.  Neurological:  Negative for dizziness and headaches.  Psychiatric/Behavioral:  Negative for dysphoric mood.     Current Outpatient Medications on  File Prior to Visit  Medication Sig Dispense Refill   aspirin  EC 81 MG tablet Take 1 tablet (81 mg total) by mouth daily. Swallow whole. 30 tablet 11   Emollient (LUBRIDERM SERIOUSLY SENSITIVE) LOTN Apply 1 Application topically as needed (itching).  0   furosemide  (LASIX ) 20 MG tablet TAKE 1 TABLET EVERY DAY AS NEEDED 90 tablet 1   Multiple Vitamins-Minerals (PRESERVISION AREDS 2) CAPS Take 1 capsule by mouth 2 (two) times daily after a meal.     polyethylene glycol (MIRALAX  / GLYCOLAX ) 17 g packet Take 17 g by mouth daily.     riTUXimab  (RITUXAN ) 500 MG/50ML injection Inject into the vein every 6 (six) months. 2 does 2 weeks apart     No current facility-administered medications on file prior to visit.   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  ANCA antibodies- myeloperoxidase ABS-greater than 100, ANCA proteinase 3-less than 3.5 04/02/2019- ANCA titers- p-ANCA +1: 160, C ANCA-less than 1: 20, atypical p-ANCA titer-less than 1: 20   Abnormal CT of the chest    Abnormal findings on diagnostic imaging of lung 04/12/2019   04/01/2019-CT chest with contrast- no acute process in chest abdomen or pelvis, interstitial lung disease suspicious for early or mild UIP, pulmonary artery enlargement suggest PAH    Acquired trigger finger of left little finger 10/27/2022   Acute gallstone pancreatitis 09/12/2022   Adult BMI 27.0-27.9 kg/sq m 07/08/2022   Alpha-1-antitrypsin deficiency carrier 05/04/2019   Atherosclerotic heart disease of native coronary artery without angina pectoris 05/03/2018   Body mass index (BMI) 29.0-29.9, adult 01/05/2020   Choledocholithiasis 09/11/2022   Chronic kidney disease, stage 3a (HCC) 08/12/2021   Chronic respiratory failure with hypoxia (HCC) 03/06/2022   CKD (chronic kidney disease) 05/03/2018   Coronary artery disease involving native coronary artery of native heart without angina pectoris 05/03/2018   Diabetes  mellitus type 2 in obese 04/01/2019   Diabetic polyneuropathy associated with type 2 diabetes mellitus (HCC) 08/12/2021   Dyslipidemia associated with type 2 diabetes mellitus (HCC) 05/03/2018   Dysuria 09/07/2020   Elevated liver enzymes 09/19/2022   Elevated rheumatoid factor 04/12/2019   04/03/2019-rheumatoid factor-95.4   GAD (generalized anxiety disorder)    GERD without esophagitis 05/03/2018   Gouty arthritis of left great toe 07/08/2022   History of kidney stones    Hypertensive heart disease without heart failure 05/03/2018   Hyponatremia 08/12/2021   Idiopathic pulmonary fibrosis (HCC) 03/29/2021   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   Interstitial pulmonary disease (HCC) 04/12/2019   04/01/2019-CT chest with contrast- no acute process in chest abdomen or pelvis, interstitial lung disease suspicious for early or mild UIP, pulmonary artery enlargement suggest PAH  04/02/2019-connective tissue work-up: Anti-Jo 1-negative Anti-DNA antibody double-stranded-negative Anti-scleroderma antibody-negative Sjogren's syndrome antibody-negative Sjogren's syndrome antibody-negative CK-31 CCP-6 E  Leukocytosis 03/08/2020   Low vitamin B12 level 04/03/2019   Medicare annual wellness visit, subsequent 08/09/2020   Microscopic polyangiitis (HCC) 03/29/2021   Mixed diabetic hyperlipidemia associated with type 2 diabetes mellitus (HCC) 08/12/2021   Mixed hyperlipidemia 05/03/2018   Osteoarthritis    Other emphysema (HCC)    Other long term (current) drug therapy 03/29/2021   Pain in finger of left hand 11/17/2022   Peripheral polyneuropathy 01/30/2020   Pneumonia of left lower lobe due to infectious organism 10/05/2022   Polymyositis (HCC)    Recurrent UTI    Respiratory disorder concurrent with and due to microscopic polyangiitis (HCC) 08/12/2021   Rheumatoid factor positive 03/29/2021   SIRS (systemic inflammatory response syndrome) (HCC) 09/11/2022   Skin cancer    Status post  total left knee replacement 12/17/2020   Swollen L ankle 09/19/2022   Transaminitis    Type 2 diabetes mellitus with hyperglycemia, with long-term current use of insulin  (HCC) 04/23/2021   UC (ulcerative colitis) (HCC)    Urinary frequency 09/19/2022   Vasculitis (HCC) 03/29/2021   Vitamin D  deficiency 10/21/2022   Past Surgical History:  Procedure Laterality Date   ANGIOPLASTY  2010   BACK SURGERY     CATARACT EXTRACTION     COLONOSCOPY  06/16/2005   Mild colitis involving splenic flexure. Colonic polyps, status post polypectomy. Mild pancolonic diverticulitits. Internal hemorrhoids.    ERCP N/A 09/13/2022   Procedure: ENDOSCOPIC RETROGRADE CHOLANGIOPANCREATOGRAPHY (ERCP);  Surgeon: Avram Lupita BRAVO, MD;  Location: [facility];  Service: Gastroenterology;  Laterality: N/A;   ESOPHAGOGASTRODUODENOSCOPY  04/26/2003   Irregular Z line suggestive of GERD. Mild gastritis status post CLO testing.    IR KYPHO EA ADDL LEVEL THORACIC OR LUMBAR  04/20/2023   IR KYPHO LUMBAR INC FX REDUCE BONE BX UNI/BIL CANNULATION INC/IMAGING  04/16/2023   IR KYPHO THORACIC WITH BONE BIOPSY  04/16/2023   IR RADIOLOGIST EVAL & MGMT  03/20/2023   REMOVAL OF STONES  09/13/2022   Procedure: REMOVAL OF STONES;  Surgeon: Avram Lupita BRAVO, MD;  Location: [facility];  Service: Gastroenterology;;   ANNETT  09/13/2022   Procedure: ANNETT;  Surgeon: Avram Lupita BRAVO, MD;  Location: [facility];  Service: Gastroenterology;;   TOTAL KNEE ARTHROPLASTY Left 12/17/2020   Procedure: LEFT TOTAL KNEE ARTHROPLASTY;  Surgeon: Jerri Kay HERO, MD;  Location: [facility];  Service: Orthopedics;  Laterality: Left;   TRIGGER FINGER RELEASE      Family History  Problem Relation Age of Onset   Tuberculosis Mother    Stroke Father    Pancreatic cancer Sister    Heart attack Sister    Lung disease Sister    Clotting disorder Brother    Colon cancer Neg Hx    Esophageal cancer Neg Hx    Social History   Socioeconomic  History   Marital status: Married    Spouse name: Not on file   Number of children: 2   Years of education: Not on file   Highest education level: 8th grade  Occupational History   Occupation: retired  Tobacco Use   Smoking status: Never   Smokeless tobacco: Never  Vaping Use   Vaping status: Never Used  Substance and Sexual Activity   Alcohol  use: Never   Drug use: Never   Sexual activity: Not on file  Other Topics Concern   Not on file  Social History Narrative   Not on file   Social Drivers of Health   Financial Resource Strain: Low Risk  (  04/09/2023)   Overall Financial Resource Strain (CARDIA)    Difficulty of Paying Living Expenses: Not hard at all  Food Insecurity: No Food Insecurity (04/09/2023)   Hunger Vital Sign    Worried About Running Out of Food in the Last Year: Never true    Ran Out of Food in the Last Year: Never true  Transportation Needs: No Transportation Needs (04/09/2023)   PRAPARE - Administrator, Civil Service (Medical): No    Lack of Transportation (Non-Medical): No  Physical Activity: Sufficiently Active (04/09/2023)   Exercise Vital Sign    Days of Exercise per Week: 7 days    Minutes of Exercise per Session: 30 min  Stress: No Stress Concern Present (04/09/2023)   [employer] of Occupational Health - Occupational Stress Questionnaire    Feeling of Stress : Not at all  Recent Concern: Stress - Stress Concern Present (01/20/2023)   [employer] of Occupational Health - Occupational Stress Questionnaire    Feeling of Stress : To some extent  Social Connections: Moderately Integrated (09/08/2023)   Social Connection and Isolation Panel [NHANES]    Frequency of Communication with Friends and Family: More than three times a week    Frequency of Social Gatherings with Friends and Family: Three times a week    Attends Religious Services: 1 to 4 times per year    Active Member of Clubs or Organizations: Patient declined    Attends  Banker Meetings: Never    Marital Status: Married    Objective:  BP 122/68   Pulse 73   Temp 97.6 F (36.4 C)   Ht 5' 6 (1.676 m)   Wt 173 lb (78.5 kg)   SpO2 98%   BMI 27.92 kg/m      09/08/2023    2:00 PM 06/05/2023    8:12 AM 04/16/2023    1:21 PM  BP/Weight  Systolic BP 122 110 142  Diastolic BP 68 74 69  Wt. (Lbs) 173 166   BMI 27.92 kg/m2 26.79 kg/m2     Physical Exam Vitals reviewed.  Constitutional:      Appearance: Normal appearance.  HENT:     Nose: Rhinorrhea present.     Mouth/Throat:     Pharynx: No oropharyngeal exudate or posterior oropharyngeal erythema.  Neck:     Vascular: No carotid bruit.  Cardiovascular:     Rate and Rhythm: Normal rate and regular rhythm.     Heart sounds: Normal heart sounds.  Pulmonary:     Effort: Pulmonary effort is normal.     Breath sounds: Rales (BL bases) present. No wheezing or rhonchi.  Abdominal:     General: Bowel sounds are normal.     Palpations: Abdomen is soft.     Tenderness: There is no abdominal tenderness.  Neurological:     Mental Status: He is alert and oriented to person, place, and time.  Psychiatric:        Mood and Affect: Mood normal.        Behavior: Behavior normal.   Joint Injection/Arthrocentesis  Date/Time: 09/08/2023 3:05 PM  Performed by: Sherre Clapper, MD Authorized by: Sherre Clapper, MD  Indications: pain  Body area: shoulder Joint: right shoulder Local anesthesia used: yes  Anesthesia: Local anesthesia used: yes Local Anesthetic: topical anesthetic  Sedation: Patient sedated: no  Needle size: 22 G Ultrasound guidance: no Approach: posterior Triamcinolone  amount: 40 mg Lidocaine  1% amount: 5 mL Patient tolerance: patient tolerated the procedure well with  no immediate complications      Diabetic Foot Exam - Simple   Simple Foot Form Diabetic Foot exam was performed with the following findings: Yes 09/08/2023  3:04 PM  Visual Inspection See comments:  Yes Sensation Testing Intact to touch and monofilament testing bilaterally: Yes Pulse Check Posterior Tibialis and Dorsalis pulse intact bilaterally: Yes Comments Thickened brown nails.       Lab Results  Component Value Date   WBC 11.3 (H) 09/08/2023   HGB 15.5 09/08/2023   HCT 46.5 09/08/2023   PLT 284 09/08/2023   GLUCOSE 129 (H) 09/08/2023   CHOL 181 09/08/2023   TRIG 189 (H) 09/08/2023   HDL 45 09/08/2023   LDLCALC 103 (H) 09/08/2023   ALT 16 09/08/2023   AST 23 09/08/2023   NA 139 09/08/2023   K 4.8 09/08/2023   CL 100 09/08/2023   CREATININE 1.42 (H) 09/08/2023   BUN 17 09/08/2023   CO2 22 09/08/2023   TSH 2.028 09/11/2022   INR 1.2 04/16/2023   HGBA1C 6.1 (H) 09/08/2023   MICROALBUR 10 04/11/2021      Assessment & Plan:    Hypertensive heart disease without heart failure Assessment & Plan: Well controlled.  No changes to medicines. Continue lasix  20 mg once daily as needed, aspirin  81 mg daily, and Repatha  every 2 weeks.   Management per specialist.    Orders: -     CBC with Differential/Platelet -     Comprehensive metabolic panel  Diabetic polyneuropathy associated with type 2 diabetes mellitus (HCC) Assessment & Plan: Control: good Recommend check sugars fasting daily. Recommend check feet daily. Recommend annual eye exams. Medicines: none Continue to work on eating a healthy diet and exercise.  Labs drawn today.     Orders: -     Hemoglobin A1c -     Microalbumin / creatinine urine ratio -     DropSafe Alcohol  Prep; Use new pad when checking sugar  Dispense: 400 each; Refill: 3 -     Gabapentin ; Take 1 capsule (400 mg total) by mouth 3 (three) times daily.  Dispense: 270 capsule; Refill: 1 -     Blood Glucose Monitoring Suppl; 1 each by Does not apply route in the morning, at noon, and at bedtime. May substitute to any manufacturer covered by patient's insurance.  Dispense: 1 each; Refill: 0 -     Blood Glucose Test; 1 each by In Vitro  route in the morning, at noon, and at bedtime. May substitute to any manufacturer covered by patient's insurance.  Dispense: 100 strip; Refill: 3 -     Lancet Device; 1 each by Does not apply route in the morning, at noon, and at bedtime. May substitute to any manufacturer covered by patient's insurance.  Dispense: 1 each; Refill: 0 -     Lancets Misc.; 1 each by Does not apply route in the morning, at noon, and at bedtime. May substitute to any manufacturer covered by patient's insurance.  Dispense: 100 each; Refill: 3  Mixed hyperlipidemia Assessment & Plan: Well controlled Continue with Repatha  Continue to work on eating a healthy diet and exercise.  Labs drawn today.    Orders: -     Lipid panel -     Repatha ; Inject 140 mg into the skin every 14 (fourteen) days.  Dispense: 6 mL; Refill: 1  GERD without esophagitis Assessment & Plan: The current medical regimen is effective;  continue present plan and medications. Continue omeprazole  20 mg daily.  Orders: -     Omeprazole ; Take 1 capsule (20 mg total) by mouth daily.  Dispense: 90 capsule; Refill: 3  Coronary artery disease involving native coronary artery of native heart without angina pectoris Assessment & Plan: The current medical regimen is effective;  continue present plan and medications. Continue repatha . Continue aspirin  81 mg daily   Orders: -     Nitroglycerin ; Place 1 tablet (0.4 mg total) under the tongue every 5 (five) minutes as needed for chest pain.  Dispense: 25 tablet; Refill: 9  Low vitamin B12 level Assessment & Plan: The current medical regimen is effective;  continue present plan and medications. Continue b12 1000 mcg once daily.   Orders: -     Vitamin B12 -     Vitamin B-12; Take 1 tablet (1,000 mcg total) by mouth daily.  Dispense: 90 tablet; Refill: 1  Impingement of right shoulder Assessment & Plan: Arthrocentesis performed.  Orders: -     DG Shoulder Right; Future -      Arthrocentesis  Chronic kidney disease, stage 3a (HCC) Assessment & Plan: Check cmp.    Disorder of respiratory system due to microscopic polyangiitis [facility]) Assessment & Plan: Follow up with pulmonology. Continue rituxin.    Allergic rhinitis, unspecified seasonality, unspecified trigger Assessment & Plan: Likely cause of cough.  Start zyrtec 10 mg before bed.        Meds ordered this encounter  Medications   Alcohol  Swabs  (DROPSAFE ALCOHOL  PREP) 70 % PADS    Sig: Use new pad when checking sugar    Dispense:  400 each    Refill:  3   Evolocumab  (REPATHA ) 140 MG/ML SOSY    Sig: Inject 140 mg into the skin every 14 (fourteen) days.    Dispense:  6 mL    Refill:  1   gabapentin  (NEURONTIN ) 400 MG capsule    Sig: Take 1 capsule (400 mg total) by mouth 3 (three) times daily.    Dispense:  270 capsule    Refill:  1   nitroGLYCERIN  (NITROSTAT ) 0.4 MG SL tablet    Sig: Place 1 tablet (0.4 mg total) under the tongue every 5 (five) minutes as needed for chest pain.    Dispense:  25 tablet    Refill:  9   omeprazole  (PRILOSEC) 20 MG capsule    Sig: Take 1 capsule (20 mg total) by mouth daily.    Dispense:  90 capsule    Refill:  3   cyanocobalamin  (VITAMIN B12) 1000 MCG tablet    Sig: Take 1 tablet (1,000 mcg total) by mouth daily.    Dispense:  90 tablet    Refill:  1   Blood Glucose Monitoring Suppl DEVI    Sig: 1 each by Does not apply route in the morning, at noon, and at bedtime. May substitute to any manufacturer covered by patient's insurance.    Dispense:  1 each    Refill:  0   Glucose Blood (BLOOD GLUCOSE TEST STRIPS) STRP    Sig: 1 each by In Vitro route in the morning, at noon, and at bedtime. May substitute to any manufacturer covered by patient's insurance.    Dispense:  100 strip    Refill:  3   Lancet Device MISC    Sig: 1 each by Does not apply route in the morning, at noon, and at bedtime. May substitute to any manufacturer covered by patient's  insurance.    Dispense:  1 each  Refill:  0   Lancets Misc. MISC    Sig: 1 each by Does not apply route in the morning, at noon, and at bedtime. May substitute to any manufacturer covered by patient's insurance.    Dispense:  100 each    Refill:  3    Orders Placed This Encounter  Procedures   Joint Injection/Arthrocentesis   DG Shoulder Right   CBC with Differential/Platelet   Vitamin B12   Comprehensive metabolic panel   Hemoglobin A1c   Lipid panel   Microalbumin / creatinine urine ratio     Follow-up: Return in about 3 months (around 12/07/2023) for chronic follow up.   I,Marla I Leal-Borjas,acting as a scribe for Abigail Free, MD.,have documented all relevant documentation on the behalf of Abigail Free, MD,as directed by  Abigail Free, MD while in the presence of Abigail Free, MD.   An After Visit Summary was printed and given to the patient.  I attest that I have reviewed this visit and agree with the plan scribed by my staff.   Abigail Free, MD [facility] [phone]

## 2023-09-08 ENCOUNTER — Encounter: Payer: Self-pay | Admitting: Family Medicine

## 2023-09-08 ENCOUNTER — Ambulatory Visit (INDEPENDENT_AMBULATORY_CARE_PROVIDER_SITE_OTHER): Payer: HMO | Admitting: Family Medicine

## 2023-09-08 VITALS — BP 122/68 | HR 73 | Temp 97.6°F | Ht 66.0 in | Wt 173.0 lb

## 2023-09-08 DIAGNOSIS — M317 Microscopic polyangiitis: Secondary | ICD-10-CM

## 2023-09-08 DIAGNOSIS — E1142 Type 2 diabetes mellitus with diabetic polyneuropathy: Secondary | ICD-10-CM | POA: Diagnosis not present

## 2023-09-08 DIAGNOSIS — I119 Hypertensive heart disease without heart failure: Secondary | ICD-10-CM | POA: Diagnosis not present

## 2023-09-08 DIAGNOSIS — I2510 Atherosclerotic heart disease of native coronary artery without angina pectoris: Secondary | ICD-10-CM

## 2023-09-08 DIAGNOSIS — N1831 Chronic kidney disease, stage 3a: Secondary | ICD-10-CM

## 2023-09-08 DIAGNOSIS — M25811 Other specified joint disorders, right shoulder: Secondary | ICD-10-CM | POA: Diagnosis not present

## 2023-09-08 DIAGNOSIS — J309 Allergic rhinitis, unspecified: Secondary | ICD-10-CM | POA: Diagnosis not present

## 2023-09-08 DIAGNOSIS — I1310 Hypertensive heart and chronic kidney disease without heart failure, with stage 1 through stage 4 chronic kidney disease, or unspecified chronic kidney disease: Secondary | ICD-10-CM

## 2023-09-08 DIAGNOSIS — J989 Respiratory disorder, unspecified: Secondary | ICD-10-CM

## 2023-09-08 DIAGNOSIS — R7989 Other specified abnormal findings of blood chemistry: Secondary | ICD-10-CM | POA: Diagnosis not present

## 2023-09-08 DIAGNOSIS — E782 Mixed hyperlipidemia: Secondary | ICD-10-CM

## 2023-09-08 DIAGNOSIS — K219 Gastro-esophageal reflux disease without esophagitis: Secondary | ICD-10-CM | POA: Diagnosis not present

## 2023-09-08 MED ORDER — BLOOD GLUCOSE TEST VI STRP: 1.0000 | ORAL_STRIP | Freq: Three times a day (TID) | 3 refills | Status: AC

## 2023-09-08 MED ORDER — BLOOD GLUCOSE MONITORING SUPPL DEVI
1.0000 | Freq: Three times a day (TID) | 0 refills | Status: AC
Start: 2023-09-08 — End: ?

## 2023-09-08 MED ORDER — NITROGLYCERIN 0.4 MG SL SUBL
0.4000 mg | SUBLINGUAL_TABLET | SUBLINGUAL | 9 refills | Status: AC | PRN
Start: 2023-09-08 — End: ?

## 2023-09-08 MED ORDER — OMEPRAZOLE 20 MG PO CPDR: 20.0000 mg | DELAYED_RELEASE_CAPSULE | Freq: Every day | ORAL | 3 refills | Status: AC

## 2023-09-08 MED ORDER — VITAMIN B-12 1000 MCG PO TABS: 1000.0000 ug | ORAL_TABLET | Freq: Every day | ORAL | 1 refills | Status: AC

## 2023-09-08 MED ORDER — LANCET DEVICE MISC: 1.0000 | Freq: Three times a day (TID) | 0 refills | Status: AC

## 2023-09-08 MED ORDER — LANCETS MISC. MISC: 1.0000 | Freq: Three times a day (TID) | 3 refills | Status: AC

## 2023-09-08 MED ORDER — REPATHA 140 MG/ML ~~LOC~~ SOSY
140.0000 mg | PREFILLED_SYRINGE | SUBCUTANEOUS | 1 refills | Status: DC
Start: 2023-09-08 — End: 2024-05-26

## 2023-09-08 MED ORDER — GABAPENTIN 400 MG PO CAPS
400.0000 mg | ORAL_CAPSULE | Freq: Three times a day (TID) | ORAL | 1 refills | Status: DC
Start: 2023-09-08 — End: 2024-01-28

## 2023-09-08 MED ORDER — DROPSAFE ALCOHOL PREP 70 % PADS: MEDICATED_PAD | 3 refills | Status: AC

## 2023-09-08 NOTE — Patient Instructions (Signed)
 VISIT SUMMARY:  During today's visit, we discussed your ongoing shoulder pain and chronic cough, both of which have been troubling you for the past three months. We also reviewed your current management plan for diabetes, microscopic polyangiitis, and hyperlipidemia. Your diabetes appears to be well-controlled, and your other conditions are stable with the current treatment regimen.  YOUR PLAN:  -SHOULDER PAIN: You have been experiencing severe shoulder pain for the past three months. To help alleviate the pain, we administered a corticosteroid injection in your shoulder and may consider OTC diclofenac  gel for topical application.  -CHRONIC COUGH: You have had a persistent cough with phlegm for the past three months, likely due to drainage in your throat. We have prescribed Zyrtec 10mg  to be taken once daily at bedtime to help reduce the phlegm and cough.  -MICROSCOPIC POLYANGIITIS: Microscopic polyangiitis is a condition that causes inflammation of small blood vessels. Your condition is stable, and you will continue receiving Rituxan  infusions every six months.  -DIABETES: Your diabetes is well-controlled with blood glucose levels ranging from 88 to 140. Continue with your current management and monitoring routine.  -HYPERLIPIDEMIA: Hyperlipidemia means having high levels of fats in your blood. You are currently on Repatha , and there have been no recent issues. Continue with your current medication regimen.  -GENERAL HEALTH MAINTENANCE: We have ordered routine blood work and will ensure you have an appropriate glucose meter covered by your new insurance. Continue taking your current medications, including Lasix  as needed, baby aspirin , and Vitamin D .  INSTRUCTIONS:  Please follow up with the pulmonologist in February as planned. Continue monitoring your blood glucose levels daily and take your medications as prescribed. If you experience any new symptoms or if your current symptoms worsen,  please contact our office.

## 2023-09-09 ENCOUNTER — Telehealth: Payer: Self-pay

## 2023-09-09 LAB — CBC WITH DIFFERENTIAL/PLATELET
Basophils Absolute: 0.1 10*3/uL (ref 0.0–0.2)
Basos: 1 %
EOS (ABSOLUTE): 0.7 10*3/uL — ABNORMAL HIGH (ref 0.0–0.4)
Eos: 6 %
Hematocrit: 46.5 % (ref 37.5–51.0)
Hemoglobin: 15.5 g/dL (ref 13.0–17.7)
Immature Grans (Abs): 0 10*3/uL (ref 0.0–0.1)
Immature Granulocytes: 0 %
Lymphocytes Absolute: 3.8 10*3/uL — ABNORMAL HIGH (ref 0.7–3.1)
Lymphs: 33 %
MCH: 29.4 pg (ref 26.6–33.0)
MCHC: 33.3 g/dL (ref 31.5–35.7)
MCV: 88 fL (ref 79–97)
Monocytes Absolute: 1 10*3/uL — ABNORMAL HIGH (ref 0.1–0.9)
Monocytes: 9 %
Neutrophils Absolute: 5.8 10*3/uL (ref 1.4–7.0)
Neutrophils: 51 %
Platelets: 284 10*3/uL (ref 150–450)
RBC: 5.27 x10E6/uL (ref 4.14–5.80)
RDW: 13 % (ref 11.6–15.4)
WBC: 11.3 10*3/uL — ABNORMAL HIGH (ref 3.4–10.8)

## 2023-09-09 LAB — MICROALBUMIN / CREATININE URINE RATIO
Creatinine, Urine: 75.5 mg/dL
Microalb/Creat Ratio: <4 mg/g{creat} (ref 0–29)
Microalbumin, Urine: <3 ug/mL

## 2023-09-09 LAB — COMPREHENSIVE METABOLIC PANEL
ALT: 16 [IU]/L (ref 0–44)
AST: 23 [IU]/L (ref 0–40)
Albumin: 4.3 g/dL (ref 3.7–4.7)
Alkaline Phosphatase: 197 [IU]/L — ABNORMAL HIGH (ref 44–121)
BUN/Creatinine Ratio: 12 (ref 10–24)
BUN: 17 mg/dL (ref 8–27)
Bilirubin Total: 0.5 mg/dL (ref 0.0–1.2)
CO2: 22 mmol/L (ref 20–29)
Calcium: 9.1 mg/dL (ref 8.6–10.2)
Chloride: 100 mmol/L (ref 96–106)
Creatinine, Ser: 1.42 mg/dL — ABNORMAL HIGH (ref 0.76–1.27)
Globulin, Total: 2.4 g/dL (ref 1.5–4.5)
Glucose: 129 mg/dL — ABNORMAL HIGH (ref 70–99)
Potassium: 4.8 mmol/L (ref 3.5–5.2)
Sodium: 139 mmol/L (ref 134–144)
Total Protein: 6.7 g/dL (ref 6.0–8.5)
eGFR: 48 mL/min/{1.73_m2} — ABNORMAL LOW (ref >59)

## 2023-09-09 LAB — LIPID PANEL
Chol/HDL Ratio: 4 {ratio} (ref 0.0–5.0)
Cholesterol, Total: 181 mg/dL (ref 100–199)
HDL: 45 mg/dL (ref >39)
LDL Chol Calc (NIH): 103 mg/dL — ABNORMAL HIGH (ref 0–99)
Triglycerides: 189 mg/dL — ABNORMAL HIGH (ref 0–149)
VLDL Cholesterol Cal: 33 mg/dL (ref 5–40)

## 2023-09-09 LAB — VITAMIN B12: Vitamin B-12: 1174 pg/mL (ref 232–1245)

## 2023-09-09 LAB — HEMOGLOBIN A1C
Est. average glucose Bld gHb Est-mCnc: 128 mg/dL
Hgb A1c MFr Bld: 6.1 % — ABNORMAL HIGH (ref 4.8–5.6)

## 2023-09-09 NOTE — Telephone Encounter (Signed)
 Patient has been approved for Repatha  Sureclick 140mg /mL till July 15 th, 2025

## 2023-09-10 ENCOUNTER — Other Ambulatory Visit: Payer: Self-pay

## 2023-09-10 DIAGNOSIS — J309 Allergic rhinitis, unspecified: Secondary | ICD-10-CM | POA: Insufficient documentation

## 2023-09-10 DIAGNOSIS — M25811 Other specified joint disorders, right shoulder: Secondary | ICD-10-CM | POA: Insufficient documentation

## 2023-09-10 DIAGNOSIS — J989 Respiratory disorder, unspecified: Secondary | ICD-10-CM | POA: Insufficient documentation

## 2023-09-10 DIAGNOSIS — N1831 Chronic kidney disease, stage 3a: Secondary | ICD-10-CM

## 2023-09-10 NOTE — Assessment & Plan Note (Signed)
The current medical regimen is effective;  continue present plan and medications. Continue b12 1000 mcg once daily.

## 2023-09-10 NOTE — Assessment & Plan Note (Signed)
Control: good Recommend check sugars fasting daily. Recommend check feet daily. Recommend annual eye exams. Medicines: none Continue to work on eating a healthy diet and exercise.  Labs drawn today.    

## 2023-09-10 NOTE — Assessment & Plan Note (Signed)
Well controlled.  No changes to medicines. Continue lasix 20 mg once daily as needed, aspirin 81 mg daily, and Repatha every 2 weeks.   Management per specialist.

## 2023-09-10 NOTE — Assessment & Plan Note (Signed)
Check cmp 

## 2023-09-10 NOTE — Assessment & Plan Note (Signed)
Arthrocentesis performed.

## 2023-09-10 NOTE — Assessment & Plan Note (Signed)
Follow up with pulmonology. Continue rituxin.

## 2023-09-10 NOTE — Assessment & Plan Note (Signed)
Likely cause of cough.  Start zyrtec 10 mg before bed.

## 2023-09-10 NOTE — Assessment & Plan Note (Signed)
The current medical regimen is effective;  continue present plan and medications. Continue repatha. Continue aspirin 81 mg daily

## 2023-09-10 NOTE — Assessment & Plan Note (Signed)
The current medical regimen is effective;  continue present plan and medications. Continue omeprazole 20 mg daily.    

## 2023-09-10 NOTE — Assessment & Plan Note (Signed)
Well controlled.  Continue with Repatha. Continue to work on eating a healthy diet and exercise.  Labs drawn today.   

## 2023-09-21 ENCOUNTER — Ambulatory Visit: Payer: Medicare HMO | Admitting: Pulmonary Disease

## 2023-09-30 DIAGNOSIS — M317 Microscopic polyangiitis: Secondary | ICD-10-CM | POA: Diagnosis not present

## 2023-09-30 DIAGNOSIS — E663 Overweight: Secondary | ICD-10-CM | POA: Diagnosis not present

## 2023-09-30 DIAGNOSIS — R5383 Other fatigue: Secondary | ICD-10-CM | POA: Diagnosis not present

## 2023-09-30 DIAGNOSIS — J8410 Pulmonary fibrosis, unspecified: Secondary | ICD-10-CM | POA: Diagnosis not present

## 2023-09-30 DIAGNOSIS — Z79899 Other long term (current) drug therapy: Secondary | ICD-10-CM | POA: Diagnosis not present

## 2023-09-30 DIAGNOSIS — R768 Other specified abnormal immunological findings in serum: Secondary | ICD-10-CM | POA: Diagnosis not present

## 2023-09-30 DIAGNOSIS — Z6826 Body mass index (BMI) 26.0-26.9, adult: Secondary | ICD-10-CM | POA: Diagnosis not present

## 2023-09-30 DIAGNOSIS — J84112 Idiopathic pulmonary fibrosis: Secondary | ICD-10-CM | POA: Diagnosis not present

## 2023-10-09 ENCOUNTER — Encounter: Payer: Self-pay | Admitting: Pulmonary Disease

## 2023-10-09 ENCOUNTER — Ambulatory Visit: Payer: HMO | Admitting: Pulmonary Disease

## 2023-10-09 VITALS — BP 130/74 | HR 66 | Ht 66.0 in | Wt 172.0 lb

## 2023-10-09 DIAGNOSIS — J8410 Pulmonary fibrosis, unspecified: Secondary | ICD-10-CM

## 2023-10-09 DIAGNOSIS — Z5181 Encounter for therapeutic drug level monitoring: Secondary | ICD-10-CM | POA: Diagnosis not present

## 2023-10-09 DIAGNOSIS — J849 Interstitial pulmonary disease, unspecified: Secondary | ICD-10-CM

## 2023-10-09 NOTE — Patient Instructions (Addendum)
I am glad you are doing well with the breathing Continue the Rituxan as scheduled Your recent CT scan is stable Follow up in 6 months

## 2023-10-09 NOTE — Progress Notes (Signed)
 Kerry Matthews    [id number]    06/14/1936  Primary Care Physician:Cox, Fritzi Mandes, MD  Referring Physician: Blane Ohara, MD [address] [place],  [place] [address]  Chief complaint:  Follow up for  CTD interstitial lung disease,  Microscopic polyangiitis Alpha-1 antitrypsin carrier  HPI: 88 y.o. with pulmonary fibrosis on CT chest. He has complains of joint stiffness, generalized myalgias, low-grade fevers starting July 2020.  The symptoms generally respond to prednisone.  Denies any renal symptoms, difficulty swallowing.  Ruled out for COVID- 19 multiple times.  2022 He has seen Dr. Nickola Major, Rheumatology. Serologies reviewed which show elevated p-ANCA, myeloperoxidase with elevated CRP, normal sed rate, elevated IgG4 subclass, repeat rheumatoid factor is negative. 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  Was initially on prednisone and eventually got started on Rituxan by Dr. Nickola Major from rheumatology in late 2020 with tapering off of prednisone.  Symptoms have been stable while on Rituxan  He developed COVID-19 infection on 07/29/2021 which was treated as an outpatient with Molnupiravir.  However his situation deteriorated and was admitted to [facility] on 08/12/2021 to 08/25/2021. He was diagnosed with multifocal pneumonia, Acute Respiratory failure up to 15 L.  Treated with convalescent plasma, IV steroids which was transitioned to prednisone 60 mg.    2023 Received note from Dr. Zenovia Jordan 02/19/2022 Follow-up for microscopic polyangiitis, elevated rheumatoid factor.  There is no mention of hyper IgE syndrome on rheumatology assessment  Per rheumatology assessment of microscopic polyangiitis is not contributing to pulmonary symptoms and at this point he does not repeat rituximab from rheumatology standpoint.  Weaned off steroid and started Ofev in July 2023.  This was  poorly tolerated with fatigue, depression.  He held [facility] for a few weeks and restarted but had recurrent symptoms so discontinued altogether. Restarted on Rituxan in November 2023 by Dr. Nickola Major from rheumatology since he could not tolerate the Ofev.  Pets: Has a dog.  No birds, farm animals Occupation: Worked in a [employer], Administrator.  Caretaker for cemetry.  Currently retired Exposures, Expanded ILD questionnaire 05/04/2019-reports some woodwork and gardening in the past and gardening with exposure to Roundup weed killer. No mold, hot tub, Jacuzzi, down, humidifier Smoking history: Never smoker Travel history: No significant travel history Relevant family history: 2 sisters died of pulmonary fibrosis of unclear etiology.  1 of the sisters also had COPD  Interim history:  Discussed the use of AI scribe software for clinical note transcription with the patient, who gave verbal consent to proceed.  Kerry Matthews is an 88 year old male with pulmonary fibrosis and vasculitis who presents for follow-up regarding his medication regimen.  He has pulmonary fibrosis and is currently on Rituxan therapy. His condition has been stable, allowing him to engage in more activities, although he experiences increased fatigue. He is concerned about the potential risk of his pulmonary fibrosis flaring up if the medication dose is reduced, which could lead to a decline in his breathing and possibly necessitate a return to oxygen therapy.  He is on a regimen of Rituxan at a dose of 1000 mg administered twice, two weeks apart, every six months. This dosage was increased from a previous regimen of 500 mg administered once every six months to manage his pulmonary fibrosis, while the original lower dose was for his vasculitis. He wants to potentially reduce the dose back to 500  mg due to concerns about side effects, although he acknowledges that the current regimen has kept his interstitial lung disease stable.  He  experiences a persistent cough with clear phlegm, which he attributes to allergies as advised by previous doctors. Over-the-counter medications like Claritin for allergies and Delsym or Mucinex for cough management have been recommended. He recalls that cough drops provided during a past hospital stay for pneumonia were helpful.  A CT scan conducted in December 2024 showed stable results, which aligns with his current stable condition.  He has a history of vasculitis, which was previously treated with Rituxan at a lower dose of 500 mg. The current higher dose is primarily for pulmonary fibrosis management, and his vasculitis is currently stable.   Outpatient Encounter Medications as of 10/09/2023  Medication Sig   Alcohol Swabs (DROPSAFE ALCOHOL PREP) 70 % PADS Use new pad when checking sugar   aspirin EC 81 MG tablet Take 1 tablet (81 mg total) by mouth daily. Swallow whole.   Blood Glucose Monitoring Suppl DEVI 1 each by Does not apply route in the morning, at noon, and at bedtime. May substitute to any manufacturer covered by patient's insurance.   cyanocobalamin (VITAMIN B12) 1000 MCG tablet Take 1 tablet (1,000 mcg total) by mouth daily.   Emollient (LUBRIDERM SERIOUSLY SENSITIVE) LOTN Apply 1 Application topically as needed (itching).   Evolocumab (REPATHA) 140 MG/ML SOSY Inject 140 mg into the skin every 14 (fourteen) days. (Patient taking differently: Inject 140 mg into the skin every 21 ( twenty-one) days.)   furosemide (LASIX) 20 MG tablet TAKE 1 TABLET EVERY DAY AS NEEDED   gabapentin (NEURONTIN) 400 MG capsule Take 1 capsule (400 mg total) by mouth 3 (three) times daily. (Patient taking differently: Take 400 mg by mouth 2 (two) times daily.)   Glucose Blood (BLOOD GLUCOSE TEST STRIPS) STRP 1 each by In Vitro route in the morning, at noon, and at bedtime. May substitute to any manufacturer covered by patient's insurance.   Multiple Vitamins-Minerals (PRESERVISION AREDS 2) CAPS Take 1  capsule by mouth 2 (two) times daily after a meal.   nitroGLYCERIN (NITROSTAT) 0.4 MG SL tablet Place 1 tablet (0.4 mg total) under the tongue every 5 (five) minutes as needed for chest pain.   omeprazole (PRILOSEC) 20 MG capsule Take 1 capsule (20 mg total) by mouth daily.   polyethylene glycol (MIRALAX / GLYCOLAX) 17 g packet Take 17 g by mouth daily.   riTUXimab (RITUXAN) 500 MG/50ML injection Inject 1,000 mg into the vein every 6 (six) months. 2 does 2 weeks apart   No facility-administered encounter medications on file as of 10/09/2023.   Physical Exam: Blood pressure 130/74, pulse 66, height 5\' 6"  (1.676 m), weight 172 lb (78 kg), SpO2 97%. Gen:      No acute distress HEENT:  EOMI, sclera anicteric Neck:     No masses; no thyromegaly Lungs:    Clear to auscultation bilaterally; normal respiratory effort CV:         Regular rate and rhythm; no murmurs Abd:      + bowel sounds; soft, non-tender; no palpable masses, no distension Ext:    No edema; adequate peripheral perfusion Skin:      Warm and dry; no rash Neuro: alert and oriented x 3 Psych: normal mood and affect   Data Reviewed: Imaging: CT high-resolution 04/19/2019- patchy groundglass attenuation, septal thickening with reticulation and mild bronchiectasis with basal gradient.  No definitive honeycombing Probable UIP.   CT  high-resolution 10/14/2019-stable findings of interstitial lung disease, pulmonary fibrosis and probable UIP pattern  CTA 08/12/2021-no pulmonary embolism, patchy multifocal airspace disease  CTA 08/18/2021-no PE, extensive bilateral homogeneous groundglass opacities with septal thickening, fibrotic changes at the bases.  High-resolution CT 01/28/2022-pulmonary pattern of fibrosis with progressive UIP changes.  High resolution CT 01/01/2023-stable pattern of pulmonary fibrosis.  Previously noted groundglass opacities have resolved.  High resolution CT 08/03/2023-stable pattern of interstitial lung disease and  UIP pattern. I have reviewed the images personally  PFTs: 05/03/2019 FVC 3.22 [92%], FEV1 2.57 [104%], F/F 80, TLC 4.85 [95%], DLCO 18.93 [85%] Minimal restriction  Labs: 04/02/2019- p-ANCA 1:160, c-ANCA negative, MPO>100, rheumatoid factor 95 CCP negative, Sjogren's antibody negative, SCL 70- negative Jo 1-negative, aldolase 6.3, CK 31  Alpha-1 antitrypsin 04/12/2019- 124, PI MZ  Overnight oximetry 03/12/2022 Time of study was 9 hours and 4 minutes Time spent less than 88% 7 hours 4 minutes Oxygen desaturation index 14.35   Overnight oximetry on room air dated 09/30/2022 Time of study 6 hours 25 minutes Nadir O2 sat of 77%.   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  He has not tolerated Ofev and is not interested in retrying antifibrotic's and would rather wait and watch.  Has been restarted on Rituxan per Dr. Nickola Major.    There is a question of whether he can go back to a lower dose of 500 mg every 6 months instead of the higher dose of 1g every 2 weeks 6 monthly. Pulmonary fibrosis is well-managed with current dose every six months, as evidenced by the recent CT scan from December 2024. Discussed risks of dose reduction, including ILD flare-up, worsening dyspnea, hypoxemia, and potential need for supplemental oxygen. He prefers to maintain the current dosage to avoid exacerbation.  - Continue Rituxan 1000 mg every six months - Monitor pulmonary function and symptoms - Schedule follow-up visit in six months  Chronic Cough with Phlegm Chronic cough with clear phlegm, likely secondary to interstitial lung disease or bronchitis, with possible allergic component. Advised on symptomatic management with OTC medications.  - Recommend OTC Delsym or Mucinex for cough  management - Consider use of cough drops as needed - Monitor for changes in cough or phlegm characteristics  Vasculitis (Polyangiitis) Vasculitis managed with Rituxan, initially at 500 mg, increased to 1000 mg to address pulmonary fibrosis. He has experienced some side effects but prefers to maintain the higher dose to prevent flare-ups. - Continue Rituxan 1000 mg every six months - Monitor for side effects and symptoms of vasculitis  Nocturnal hypoxia He would like to come off supplemental oxygen at night since he feels that he does not need it.  We reviewed his overnight oximetry from February 2024 which showed significant desats.  Still he came off supplemental oxygen altogether as he does not want to keep it.  Alpha-1 antitrypsin carrier status His A1AT levels are normal with no obstruction on pulmonary function test.  LFTs are normal Continue monitoring.  No need for treatment.  CHF, HFpEF Continue Lasix  Follow-up - Schedule follow-up visit in six months - Send MyChart message to Dr. Lendon Colonel regarding the current treatment plan and his decision to maintain the current Rituxan dose.   Plan/Recommendations: Continue Rituxan Follow up in 6 months  Chilton Greathouse MD [facility] 10/09/2023,  10:52 AM  CC: Blane Ohara, MD

## 2023-10-16 ENCOUNTER — Ambulatory Visit: Payer: HMO | Admitting: Podiatry

## 2023-10-16 DIAGNOSIS — E1151 Type 2 diabetes mellitus with diabetic peripheral angiopathy without gangrene: Secondary | ICD-10-CM

## 2023-10-16 DIAGNOSIS — B351 Tinea unguium: Secondary | ICD-10-CM

## 2023-10-16 DIAGNOSIS — M79674 Pain in right toe(s): Secondary | ICD-10-CM | POA: Diagnosis not present

## 2023-10-16 DIAGNOSIS — M79675 Pain in left toe(s): Secondary | ICD-10-CM | POA: Diagnosis not present

## 2023-10-16 NOTE — Progress Notes (Signed)
Subjective:  Patient ID: Kerry Matthews, male    DOB: 1935/11/22,  MRN: [id number]  Kerry Matthews presents to clinic today for:  Chief Complaint  Patient presents with   [facility]    RFC today with callous and his feet are cold. States not diabetic, not medicated for it. Takes ASA 81.   Patient notes nails are thick, discolored, elongated and painful in shoegear when trying to ambulate.    PCP is Cox, Fritzi Mandes, MD.  Last seen 09/08/23  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  ANCA antibodies- myeloperoxidase ABS-greater than 100, ANCA proteinase 3-less than 3.5 04/02/2019- ANCA titers- p-ANCA +1: 160, C ANCA-less than 1: 20, atypical p-ANCA titer-less than 1: 20   Abnormal CT of the chest    Abnormal findings on diagnostic imaging of lung 04/12/2019   04/01/2019-CT chest with contrast- no acute process in chest abdomen or pelvis, interstitial lung disease suspicious for early or mild UIP, pulmonary artery enlargement suggest PAH    Acquired trigger finger of left little finger 10/27/2022   Acute gallstone pancreatitis 09/12/2022   Adult BMI 27.0-27.9 kg/sq m 07/08/2022   Alpha-1-antitrypsin deficiency carrier 05/04/2019   Atherosclerotic heart disease of native coronary artery without angina pectoris 05/03/2018   Body mass index (BMI) 29.0-29.9, adult 01/05/2020   Choledocholithiasis 09/11/2022   Chronic kidney disease, stage 3a (HCC) 08/12/2021   Chronic respiratory failure with hypoxia (HCC) 03/06/2022   CKD (chronic kidney disease) 05/03/2018   Coronary artery disease involving native coronary artery of native heart without angina pectoris 05/03/2018   Diabetes mellitus type 2 in obese 04/01/2019   Diabetic polyneuropathy associated with type 2 diabetes mellitus (HCC) 08/12/2021   Dyslipidemia associated with type 2 diabetes mellitus (HCC) 05/03/2018   Dysuria 09/07/2020   Elevated liver enzymes 09/19/2022   Elevated rheumatoid  factor 04/12/2019   04/03/2019-rheumatoid factor-95.4   GAD (generalized anxiety disorder)    GERD without esophagitis 05/03/2018   Gouty arthritis of left great toe 07/08/2022   History of kidney stones    Hypertensive heart disease without heart failure 05/03/2018   Hyponatremia 08/12/2021   Idiopathic pulmonary fibrosis (HCC) 03/29/2021   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   Interstitial pulmonary disease (HCC) 04/12/2019   04/01/2019-CT chest with contrast- no acute process in chest abdomen or pelvis, interstitial lung disease suspicious for early or mild UIP, pulmonary artery enlargement suggest PAH  04/02/2019-connective tissue work-up: Anti-Jo 1-negative Anti-DNA antibody double-stranded-negative Anti-scleroderma antibody-negative Sjogren's syndrome antibody-negative Sjogren's syndrome antibody-negative CK-31 CCP-6 E   Leukocytosis 03/08/2020   Low vitamin B12 level 04/03/2019   Medicare annual wellness visit, subsequent 08/09/2020   Microscopic polyangiitis (HCC) 03/29/2021   Mixed diabetic hyperlipidemia associated with type 2 diabetes mellitus (HCC) 08/12/2021   Mixed hyperlipidemia 05/03/2018   Osteoarthritis    Other emphysema (HCC)    Other long term (current) drug therapy 03/29/2021   Pain in finger of left hand 11/17/2022   Peripheral polyneuropathy 01/30/2020   Pneumonia of left lower lobe due to infectious organism 10/05/2022   Polymyositis (HCC)    Recurrent UTI    Respiratory disorder concurrent with and due to microscopic polyangiitis (HCC) 08/12/2021   Rheumatoid factor positive 03/29/2021   SIRS (systemic inflammatory response syndrome) (HCC) 09/11/2022   Skin cancer    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/17/2020   Swollen L ankle 09/19/2022   Transaminitis    Type 2 diabetes mellitus with  hyperglycemia, with long-term current use of insulin (HCC) 04/23/2021   UC (ulcerative colitis) (HCC)    Urinary frequency 09/19/2022   Vasculitis (HCC)  03/29/2021   Vitamin D deficiency 10/21/2022    Past Surgical History:  Procedure Laterality Date   ANGIOPLASTY  2010   BACK SURGERY     CATARACT EXTRACTION     COLONOSCOPY  06/16/2005   Mild colitis involving splenic flexure. Colonic polyps, status post polypectomy. Mild pancolonic diverticulitits. Internal hemorrhoids.    ERCP N/A 09/13/2022   Procedure: ENDOSCOPIC RETROGRADE CHOLANGIOPANCREATOGRAPHY (ERCP);  Surgeon: Iva Boop, MD;  Location: [facility];  Service: Gastroenterology;  Laterality: N/A;   ESOPHAGOGASTRODUODENOSCOPY  04/26/2003   Irregular Z line suggestive of GERD. Mild gastritis status post CLO testing.    IR KYPHO EA ADDL LEVEL THORACIC OR LUMBAR  04/20/2023   IR KYPHO LUMBAR INC FX REDUCE BONE BX UNI/BIL CANNULATION INC/IMAGING  04/16/2023   IR KYPHO THORACIC WITH BONE BIOPSY  04/16/2023   IR RADIOLOGIST EVAL & MGMT  03/20/2023   REMOVAL OF STONES  09/13/2022   Procedure: REMOVAL OF STONES;  Surgeon: Iva Boop, MD;  Location: [facility];  Service: Gastroenterology;;   Dennison Mascot  09/13/2022   Procedure: Dennison Mascot;  Surgeon: Iva Boop, MD;  Location: [facility];  Service: Gastroenterology;;   TOTAL KNEE ARTHROPLASTY Left 12/17/2020   Procedure: LEFT TOTAL KNEE ARTHROPLASTY;  Surgeon: Tarry Kos, MD;  Location: [facility];  Service: Orthopedics;  Laterality: Left;   TRIGGER FINGER RELEASE      Allergies  Allergen Reactions   Ace Inhibitors Other (See Comments)    = Slow heart rate   Hydrocodone Nausea And Vomiting   Hydrocodone-Acetaminophen Nausea And Vomiting   Nsaids Other (See Comments)    Kidney issues   Sulfamethoxazole Nausea And Vomiting   Sulfamethoxazole-Trimethoprim Nausea And Vomiting   Trimethoprim Other (See Comments)    Reaction not recalled   Review of Systems: Negative except as noted in the HPI.  Objective:  Kerry Matthews is a pleasant 88 y.o. male in NAD. AAO x 3.  Vascular Examination: Capillary refill time  is 3-5 seconds to toes bilateral. Trace palpable pedal pulses b/l LE. Digital hair sparse b/l.  Skin temperature gradient WNL b/l. No varicosities b/l. No cyanosis noted b/l.   Dermatological Examination: Pedal skin with normal turgor, texture and tone b/l. No open wounds. No interdigital macerations b/l. Toenails x10 are 5-36mm thick, discolored, dystrophic with subungual debris. There is pain with compression of the nail plates.  They are elongated x10     Latest Ref Rng & Units 09/08/2023    3:10 PM 06/05/2023    8:50 AM 01/30/2023    9:12 AM 10/21/2022    8:49 AM  Hemoglobin A1C  Hemoglobin-A1c 4.8 - 5.6 % 6.1  5.9  6.1  6.2    Assessment/Plan: 1. Pain due to onychomycosis of toenails of both feet   2. Type II diabetes mellitus with peripheral circulatory disorder (HCC)    The mycotic toenails were sharply debrided x10 with sterile nail nippers and a power debriding burr to decrease bulk/thickness and length.    Return in about 3 months (around 01/13/2024) for [facility].   Clerance Lav, DPM, FACFAS [facility]     [address]  [place], [place] [address]                Office [phone]  Fax [phone]

## 2023-10-23 DIAGNOSIS — G8929 Other chronic pain: Secondary | ICD-10-CM | POA: Diagnosis not present

## 2023-10-23 DIAGNOSIS — G629 Polyneuropathy, unspecified: Secondary | ICD-10-CM | POA: Diagnosis not present

## 2023-10-23 DIAGNOSIS — M0550 Rheumatoid polyneuropathy with rheumatoid arthritis of unspecified site: Secondary | ICD-10-CM | POA: Diagnosis not present

## 2023-10-23 DIAGNOSIS — E663 Overweight: Secondary | ICD-10-CM | POA: Diagnosis not present

## 2023-10-23 DIAGNOSIS — I10 Essential (primary) hypertension: Secondary | ICD-10-CM | POA: Diagnosis not present

## 2023-10-23 DIAGNOSIS — D84821 Immunodeficiency due to drugs: Secondary | ICD-10-CM | POA: Diagnosis not present

## 2023-10-23 DIAGNOSIS — I209 Angina pectoris, unspecified: Secondary | ICD-10-CM | POA: Diagnosis not present

## 2023-10-23 DIAGNOSIS — J84112 Idiopathic pulmonary fibrosis: Secondary | ICD-10-CM | POA: Diagnosis not present

## 2023-10-23 DIAGNOSIS — K219 Gastro-esophageal reflux disease without esophagitis: Secondary | ICD-10-CM | POA: Diagnosis not present

## 2023-10-23 DIAGNOSIS — H04123 Dry eye syndrome of bilateral lacrimal glands: Secondary | ICD-10-CM | POA: Diagnosis not present

## 2023-10-23 DIAGNOSIS — E785 Hyperlipidemia, unspecified: Secondary | ICD-10-CM | POA: Diagnosis not present

## 2023-10-23 DIAGNOSIS — E538 Deficiency of other specified B group vitamins: Secondary | ICD-10-CM | POA: Diagnosis not present

## 2023-12-07 NOTE — Progress Notes (Unsigned)
 Subjective:  Patient ID: Kerry Matthews, male    DOB: August 30, 1935  Age: 88 y.o. MRN: [id number]  Chief Complaint  Patient presents with   Medical Management of Chronic Issues    Diabetes:  Complications: neuropathy Glucose checking: 1-2 times per day.  Glucose logs: 94-120 Hypoglycemia: none Current medications: Taking Gabapentin  400 mg 1 capsule BID daily. Last Eye Exam: 12/16/2022 Foot checks: daily.    Hyperlipidemia: Current medications: Patient is taking Repatha  every 3 weeks. Patient is trying to stretch it.   Hypertensive heart disease. Has history of stent  Current medications: lasix  20 mg once daily as needed, aspirin  81 mg daily, on Ntg.    Diet: eating healthy. Exercise: home physical therapy and OT.      B12 deficiency: otc b12 1000 mcg once daily.   GERD: Omeprazole  20 mg once daily.      06/05/2023    8:19 AM 04/09/2023   10:40 AM 01/30/2023    8:25 AM 07/08/2022    8:31 AM 04/08/2022    9:34 AM  Depression screen PHQ 2/9  Decreased Interest 0 0 0 0 0  Down, Depressed, Hopeless 0 0 0 0 0  PHQ - 2 Score 0 0 0 0 0  Altered sleeping   1    Tired, decreased energy   1    Change in appetite   1    Feeling bad or failure about yourself    0    Trouble concentrating   0    Moving slowly or fidgety/restless   0    Suicidal thoughts   0    PHQ-9 Score   3    Difficult doing work/chores   Somewhat difficult          12/08/2023    2:47 PM  Fall Risk   Falls in the past year? 0  Number falls in past yr: 0  Injury with Fall? 0  Follow up Falls evaluation completed    Patient Care Team: Mercy Stall, MD as PCP - General (Internal Medicine) Mannam, Praveen, MD as Consulting Physician (Pulmonary Disease) Revankar, Micael Adas, MD as Consulting Physician (Cardiology) Devon Fogo, [facility] (Inactive) (Pharmacist) Stefan Edge, MD as Consulting Physician (Rheumatology) Murtis Arthur, OD (Optometry)   Review of Systems  Constitutional:  Negative for chills,  diaphoresis, fatigue and fever.  HENT:  Negative for congestion, ear pain and sore throat.   Respiratory:  Negative for cough and shortness of breath.   Cardiovascular:  Negative for chest pain and leg swelling.  Gastrointestinal:  Negative for abdominal pain, constipation, diarrhea, nausea and vomiting.  Genitourinary:  Negative for dysuria and urgency.  Musculoskeletal:  Negative for arthralgias and myalgias.  Neurological:  Negative for dizziness and headaches.  Psychiatric/Behavioral:  Negative for dysphoric mood.     Current Outpatient Medications on File Prior to Visit  Medication Sig Dispense Refill   Alcohol  Swabs  (DROPSAFE ALCOHOL  PREP) 70 % PADS Use new pad when checking sugar 400 each 3   aspirin  EC 81 MG tablet Take 1 tablet (81 mg total) by mouth daily. Swallow whole. 30 tablet 11   Blood Glucose Monitoring Suppl DEVI 1 each by Does not apply route in the morning, at noon, and at bedtime. May substitute to any manufacturer covered by patient's insurance. 1 each 0   cyanocobalamin  (VITAMIN B12) 1000 MCG tablet Take 1 tablet (1,000 mcg total) by mouth daily. 90 tablet 1   Emollient (LUBRIDERM SERIOUSLY SENSITIVE) LOTN Apply 1 Application topically  as needed (itching).  0   Evolocumab  (REPATHA ) 140 MG/ML SOSY Inject 140 mg into the skin every 14 (fourteen) days. (Patient taking differently: Inject 140 mg into the skin every 21 ( twenty-one) days.) 6 mL 1   furosemide  (LASIX ) 20 MG tablet TAKE 1 TABLET EVERY DAY AS NEEDED 90 tablet 1   gabapentin  (NEURONTIN ) 400 MG capsule Take 1 capsule (400 mg total) by mouth 3 (three) times daily. (Patient taking differently: Take 400 mg by mouth 2 (two) times daily.) 270 capsule 1   Glucose Blood (BLOOD GLUCOSE TEST STRIPS) STRP 1 each by In Vitro route in the morning, at noon, and at bedtime. May substitute to any manufacturer covered by patient's insurance. 100 strip 3   Multiple Vitamins-Minerals (PRESERVISION AREDS 2) CAPS Take 1 capsule by  mouth 2 (two) times daily after a meal.     nitroGLYCERIN  (NITROSTAT ) 0.4 MG SL tablet Place 1 tablet (0.4 mg total) under the tongue every 5 (five) minutes as needed for chest pain. 25 tablet 9   omeprazole  (PRILOSEC) 20 MG capsule Take 1 capsule (20 mg total) by mouth daily. 90 capsule 3   polyethylene glycol (MIRALAX  / GLYCOLAX ) 17 g packet Take 17 g by mouth daily.     riTUXimab  (RITUXAN ) 500 MG/50ML injection Inject 1,000 mg into the vein every 6 (six) months. 2 does 2 weeks apart     No current facility-administered medications on file prior to visit.   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  ANCA antibodies- myeloperoxidase ABS-greater than 100, ANCA proteinase 3-less than 3.5 04/02/2019- ANCA titers- p-ANCA +1: 160, C ANCA-less than 1: 20, atypical p-ANCA titer-less than 1: 20   Abnormal CT of the chest    Abnormal findings on diagnostic imaging of lung 04/12/2019   04/01/2019-CT chest with contrast- no acute process in chest abdomen or pelvis, interstitial lung disease suspicious for early or mild UIP, pulmonary artery enlargement suggest PAH    Acquired trigger finger of left little finger 10/27/2022   Acute gallstone pancreatitis 09/12/2022   Adult BMI 27.0-27.9 kg/sq m 07/08/2022   Alpha-1-antitrypsin deficiency carrier 05/04/2019   Atherosclerotic heart disease of native coronary artery without angina pectoris 05/03/2018   Body mass index (BMI) 29.0-29.9, adult 01/05/2020   Choledocholithiasis 09/11/2022   Chronic kidney disease, stage 3a (HCC) 05/03/2018   Chronic respiratory failure with hypoxia (HCC) 03/06/2022   CKD (chronic kidney disease) 05/03/2018   Coronary artery disease involving native coronary artery of native heart without angina pectoris 05/03/2018   Diabetes mellitus type 2 in obese 04/01/2019   Diabetic polyneuropathy associated with type 2 diabetes mellitus (HCC) 08/12/2021   Dyslipidemia associated with type 2  diabetes mellitus (HCC) 05/03/2018   Dysuria 09/07/2020   Elevated liver enzymes 09/19/2022   Elevated rheumatoid factor 04/12/2019   04/03/2019-rheumatoid factor-95.4   GAD (generalized anxiety disorder)    GERD without esophagitis 05/03/2018   Gouty arthritis of left great toe 07/08/2022   History of kidney stones    Hypertensive heart disease without heart failure 05/03/2018   Hyponatremia 08/12/2021   Idiopathic pulmonary fibrosis (HCC) 03/29/2021   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   Interstitial pulmonary disease (HCC) 04/12/2019   04/01/2019-CT chest with contrast- no acute process in chest abdomen or pelvis, interstitial lung disease suspicious for early or mild UIP, pulmonary artery enlargement suggest PAH  04/02/2019-connective tissue work-up: Anti-Jo 1-negative Anti-DNA antibody double-stranded-negative Anti-scleroderma antibody-negative Sjogren's syndrome antibody-negative Sjogren's syndrome  antibody-negative CK-31 CCP-6 E   Leukocytosis 03/08/2020   Low vitamin B12 level 04/03/2019   Medicare annual wellness visit, subsequent 08/09/2020   Microscopic polyangiitis (HCC) 03/29/2021   Mixed diabetic hyperlipidemia associated with type 2 diabetes mellitus (HCC) 08/12/2021   Mixed hyperlipidemia 05/03/2018   Osteoarthritis    Other emphysema (HCC)    Other long term (current) drug therapy 03/29/2021   Pain in finger of left hand 11/17/2022   Peripheral polyneuropathy 01/30/2020   Pneumonia of left lower lobe due to infectious organism 10/05/2022   Polymyositis (HCC)    Recurrent UTI    Respiratory disorder concurrent with and due to microscopic polyangiitis (HCC) 08/12/2021   Rheumatoid factor positive 03/29/2021   SIRS (systemic inflammatory response syndrome) (HCC) 09/11/2022   Skin cancer    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/17/2020   Swollen L ankle 09/19/2022   Transaminitis    Type 2 diabetes mellitus with hyperglycemia, with long-term current use  of insulin  (HCC) 04/23/2021   UC (ulcerative colitis) (HCC)    Urinary frequency 09/19/2022   Vasculitis (HCC) 03/29/2021   Vitamin D  deficiency 10/21/2022   Past Surgical History:  Procedure Laterality Date   ANGIOPLASTY  2010   BACK SURGERY     CATARACT EXTRACTION     COLONOSCOPY  06/16/2005   Mild colitis involving splenic flexure. Colonic polyps, status post polypectomy. Mild pancolonic diverticulitits. Internal hemorrhoids.    ERCP N/A 09/13/2022   Procedure: ENDOSCOPIC RETROGRADE CHOLANGIOPANCREATOGRAPHY (ERCP);  Surgeon: Kenney Peacemaker, MD;  Location: [facility];  Service: Gastroenterology;  Laterality: N/A;   ESOPHAGOGASTRODUODENOSCOPY  04/26/2003   Irregular Z line suggestive of GERD. Mild gastritis status post CLO testing.    IR KYPHO EA ADDL LEVEL THORACIC OR LUMBAR  04/20/2023   IR KYPHO LUMBAR INC FX REDUCE BONE BX UNI/BIL CANNULATION INC/IMAGING  04/16/2023   IR KYPHO THORACIC WITH BONE BIOPSY  04/16/2023   IR RADIOLOGIST EVAL & MGMT  03/20/2023   REMOVAL OF STONES  09/13/2022   Procedure: REMOVAL OF STONES;  Surgeon: Kenney Peacemaker, MD;  Location: [facility];  Service: Gastroenterology;;   [address]  09/13/2022   Procedure: [address];  Surgeon: Kenney Peacemaker, MD;  Location: [facility];  Service: Gastroenterology;;   TOTAL KNEE ARTHROPLASTY Left 12/17/2020   Procedure: LEFT TOTAL KNEE ARTHROPLASTY;  Surgeon: Wes Hamman, MD;  Location: [facility];  Service: Orthopedics;  Laterality: Left;   TRIGGER FINGER RELEASE      Family History  Problem Relation Age of Onset   Tuberculosis Mother    Stroke Father    Pancreatic cancer Sister    Heart attack Sister    Lung disease Sister    Clotting disorder Brother    Colon cancer Neg Hx    Esophageal cancer Neg Hx    Social History   Socioeconomic History   Marital status: Married    Spouse name: Not on file   Number of children: 2   Years of education: Not on file   Highest education level: 8th grade   Occupational History   Occupation: retired  Tobacco Use   Smoking status: Never   Smokeless tobacco: Never  Vaping Use   Vaping status: Never Used  Substance and Sexual Activity   Alcohol  use: Never   Drug use: Never   Sexual activity: Not on file  Other Topics Concern   Not on file  Social History Narrative   Not on file   Social Drivers of Health  Financial Resource Strain: Low Risk  (12/07/2023)   Overall Financial Resource Strain (CARDIA)    Difficulty of Paying Living Expenses: Not hard at all  Food Insecurity: No Food Insecurity (12/07/2023)   Hunger Vital Sign    Worried About Running Out of Food in the Last Year: Never true    Ran Out of Food in the Last Year: Never true  Transportation Needs: No Transportation Needs (12/07/2023)   PRAPARE - Administrator, Civil Service (Medical): No    Lack of Transportation (Non-Medical): No  Physical Activity: Unknown (12/07/2023)   Exercise Vital Sign    Days of Exercise per Week: Patient declined    Minutes of Exercise per Session: 30 min  Stress: No Stress Concern Present (12/07/2023)   [employer] of Occupational Health - Occupational Stress Questionnaire    Feeling of Stress : Not at all  Social Connections: Socially Integrated (12/07/2023)   Social Connection and Isolation Panel [NHANES]    Frequency of Communication with Friends and Family: More than three times a week    Frequency of Social Gatherings with Friends and Family: Twice a week    Attends Religious Services: More than 4 times per year    Active Member of [employer] or Organizations: Yes    Attends Engineer, structural: More than 4 times per year    Marital Status: Married    Objective:  BP 128/78   Pulse 83   Temp 97.8 F (36.6 C)   Ht 5\' 6"  (1.676 m)   Wt 173 lb (78.5 kg)   SpO2 98%   BMI 27.92 kg/m      12/08/2023    2:44 PM 10/09/2023   10:44 AM 09/08/2023    2:00 PM  BP/Weight  Systolic BP 128 130 122  Diastolic BP 78 74  68  Wt. (Lbs) 173 172 173  BMI 27.92 kg/m2 27.76 kg/m2 27.92 kg/m2    Physical Exam Vitals reviewed.  Constitutional:      Appearance: Normal appearance.  Neck:     Vascular: No carotid bruit.  Cardiovascular:     Rate and Rhythm: Normal rate and regular rhythm.     Pulses: Normal pulses.     Heart sounds: Normal heart sounds.  Pulmonary:     Effort: Pulmonary effort is normal.     Breath sounds: Normal breath sounds. No wheezing, rhonchi or rales.  Abdominal:     General: Bowel sounds are normal.     Palpations: Abdomen is soft.     Tenderness: There is no abdominal tenderness.  Neurological:     Mental Status: He is alert.  Psychiatric:        Mood and Affect: Mood normal.        Behavior: Behavior normal.     Diabetic Foot Exam - Simple   Simple Foot Form Diabetic Foot exam was performed with the following findings: Yes 12/08/2023  3:18 PM  Visual Inspection No deformities, no ulcerations, no other skin breakdown bilaterally: Yes Sensation Testing See comments: Yes Pulse Check Posterior Tibialis and Dorsalis pulse intact bilaterally: Yes Comments Neuropathy. Decreased sensatoin.      Lab Results  Component Value Date   WBC 10.1 12/08/2023   HGB 15.3 12/08/2023   HCT 46.4 12/08/2023   PLT 289 12/08/2023   GLUCOSE 117 (H) 12/08/2023   CHOL 154 12/08/2023   TRIG 195 (H) 12/08/2023   HDL 39 (L) 12/08/2023   LDLCALC 82 12/08/2023   ALT 13 12/08/2023  AST 19 12/08/2023   NA 140 12/08/2023   K 5.4 (H) 12/08/2023   CL 103 12/08/2023   CREATININE 1.39 (H) 12/08/2023   BUN 15 12/08/2023   CO2 24 12/08/2023   TSH 2.028 09/11/2022   INR 1.2 04/16/2023   HGBA1C 6.3 (H) 12/08/2023   MICROALBUR 10 04/11/2021      Assessment & Plan:  Assessment and Plan       Hypertensive heart disease without heart failure Assessment & Plan: Well controlled.  No changes to medicines. Continue lasix  20 mg once daily as needed, aspirin  81 mg daily, and Repatha  every  2 weeks.   Management per specialist.  Labs drawn today  Orders: -     CBC with Differential/Platelet -     Comprehensive metabolic panel with GFR  GERD without esophagitis Assessment & Plan: The current medical regimen is effective;  continue present plan and medications. Continue omeprazole  20 mg daily.    Diabetic polyneuropathy associated with type 2 diabetes mellitus (HCC) Assessment & Plan: Control: good Recommend check sugars fasting daily. Recommend check feet daily. Recommend annual eye exams. Medicines: none Continue to work on eating a healthy diet and exercise.  Labs drawn today.     Orders: -     Hemoglobin A1c  Chronic kidney disease, stage 3a (HCC) Assessment & Plan: Check cmp.    Mixed hyperlipidemia Assessment & Plan: Well controlled Continue with Repatha  Continue to work on eating a healthy diet and exercise.  Labs drawn today.    Orders: -     Lipid panel     No orders of the defined types were placed in this encounter.   Orders Placed This Encounter  Procedures   CBC with Differential/Platelet   Comprehensive metabolic panel with GFR   Hemoglobin A1c   Lipid panel     Follow-up: Return in about 6 months (around 06/08/2024) for chronic follow up.   I,Marla I Leal-Borjas,acting as a scribe for Mercy Stall, MD.,have documented all relevant documentation on the behalf of Mercy Stall, MD,as directed by  Mercy Stall, MD while in the presence of Mercy Stall, MD.   An After Visit Summary was printed and given to the patient.  Mercy Stall, MD [facility] [phone]

## 2023-12-08 ENCOUNTER — Ambulatory Visit: Payer: HMO | Admitting: Family Medicine

## 2023-12-08 VITALS — BP 128/78 | HR 83 | Temp 97.8°F | Ht 66.0 in | Wt 173.0 lb

## 2023-12-08 DIAGNOSIS — I119 Hypertensive heart disease without heart failure: Secondary | ICD-10-CM

## 2023-12-08 DIAGNOSIS — E782 Mixed hyperlipidemia: Secondary | ICD-10-CM

## 2023-12-08 DIAGNOSIS — E1142 Type 2 diabetes mellitus with diabetic polyneuropathy: Secondary | ICD-10-CM | POA: Diagnosis not present

## 2023-12-08 DIAGNOSIS — K219 Gastro-esophageal reflux disease without esophagitis: Secondary | ICD-10-CM | POA: Diagnosis not present

## 2023-12-08 DIAGNOSIS — N1831 Chronic kidney disease, stage 3a: Secondary | ICD-10-CM

## 2023-12-09 ENCOUNTER — Encounter: Payer: Self-pay | Admitting: Family Medicine

## 2023-12-09 LAB — COMPREHENSIVE METABOLIC PANEL WITH GFR
ALT: 13 IU/L (ref 0–44)
AST: 19 IU/L (ref 0–40)
Albumin: 4.3 g/dL (ref 3.7–4.7)
Alkaline Phosphatase: 190 IU/L — ABNORMAL HIGH (ref 44–121)
BUN/Creatinine Ratio: 11 (ref 10–24)
BUN: 15 mg/dL (ref 8–27)
Bilirubin Total: 0.5 mg/dL (ref 0.0–1.2)
CO2: 24 mmol/L (ref 20–29)
Calcium: 9.3 mg/dL (ref 8.6–10.2)
Chloride: 103 mmol/L (ref 96–106)
Creatinine, Ser: 1.39 mg/dL — ABNORMAL HIGH (ref 0.76–1.27)
Globulin, Total: 2.1 g/dL (ref 1.5–4.5)
Glucose: 117 mg/dL — ABNORMAL HIGH (ref 70–99)
Potassium: 5.4 mmol/L — ABNORMAL HIGH (ref 3.5–5.2)
Sodium: 140 mmol/L (ref 134–144)
Total Protein: 6.4 g/dL (ref 6.0–8.5)
eGFR: 49 mL/min/{1.73_m2} — ABNORMAL LOW (ref >59)

## 2023-12-09 LAB — CBC WITH DIFFERENTIAL/PLATELET
Basophils Absolute: 0.1 10*3/uL (ref 0.0–0.2)
Basos: 1 %
EOS (ABSOLUTE): 0.6 10*3/uL — ABNORMAL HIGH (ref 0.0–0.4)
Eos: 6 %
Hematocrit: 46.4 % (ref 37.5–51.0)
Hemoglobin: 15.3 g/dL (ref 13.0–17.7)
Immature Grans (Abs): 0 10*3/uL (ref 0.0–0.1)
Immature Granulocytes: 0 %
Lymphocytes Absolute: 3.7 10*3/uL — ABNORMAL HIGH (ref 0.7–3.1)
Lymphs: 36 %
MCH: 29.5 pg (ref 26.6–33.0)
MCHC: 33 g/dL (ref 31.5–35.7)
MCV: 90 fL (ref 79–97)
Monocytes Absolute: 0.9 10*3/uL (ref 0.1–0.9)
Monocytes: 9 %
Neutrophils Absolute: 4.9 10*3/uL (ref 1.4–7.0)
Neutrophils: 48 %
Platelets: 289 10*3/uL (ref 150–450)
RBC: 5.18 x10E6/uL (ref 4.14–5.80)
RDW: 13.5 % (ref 11.6–15.4)
WBC: 10.1 10*3/uL (ref 3.4–10.8)

## 2023-12-09 LAB — LIPID PANEL
Chol/HDL Ratio: 3.9 ratio (ref 0.0–5.0)
Cholesterol, Total: 154 mg/dL (ref 100–199)
HDL: 39 mg/dL — ABNORMAL LOW (ref >39)
LDL Chol Calc (NIH): 82 mg/dL (ref 0–99)
Triglycerides: 195 mg/dL — ABNORMAL HIGH (ref 0–149)
VLDL Cholesterol Cal: 33 mg/dL (ref 5–40)

## 2023-12-09 LAB — HEMOGLOBIN A1C
Est. average glucose Bld gHb Est-mCnc: 134 mg/dL
Hgb A1c MFr Bld: 6.3 % — ABNORMAL HIGH (ref 4.8–5.6)

## 2023-12-12 ENCOUNTER — Encounter: Payer: Self-pay | Admitting: Family Medicine

## 2023-12-12 NOTE — Assessment & Plan Note (Signed)
Control: good Recommend check sugars fasting daily. Recommend check feet daily. Recommend annual eye exams. Medicines: none Continue to work on eating a healthy diet and exercise.  Labs drawn today.    

## 2023-12-12 NOTE — Assessment & Plan Note (Signed)
Well controlled.  Continue with Repatha. Continue to work on eating a healthy diet and exercise.  Labs drawn today.   

## 2023-12-12 NOTE — Assessment & Plan Note (Signed)
 Well controlled.  No changes to medicines. Continue lasix  20 mg once daily as needed, aspirin  81 mg daily, and Repatha  every 2 weeks.   Management per specialist.  Labs drawn today

## 2023-12-12 NOTE — Assessment & Plan Note (Signed)
 The current medical regimen is effective;  continue present plan and medications. Continue omeprazole 20 mg daily.

## 2023-12-12 NOTE — Assessment & Plan Note (Signed)
 Check cmp

## 2023-12-21 DIAGNOSIS — L821 Other seborrheic keratosis: Secondary | ICD-10-CM | POA: Diagnosis not present

## 2023-12-21 DIAGNOSIS — L578 Other skin changes due to chronic exposure to nonionizing radiation: Secondary | ICD-10-CM | POA: Diagnosis not present

## 2023-12-21 DIAGNOSIS — L570 Actinic keratosis: Secondary | ICD-10-CM | POA: Diagnosis not present

## 2023-12-21 DIAGNOSIS — L820 Inflamed seborrheic keratosis: Secondary | ICD-10-CM | POA: Diagnosis not present

## 2024-01-05 DIAGNOSIS — M317 Microscopic polyangiitis: Secondary | ICD-10-CM | POA: Diagnosis not present

## 2024-01-15 ENCOUNTER — Ambulatory Visit: Payer: HMO | Admitting: Podiatry

## 2024-01-15 DIAGNOSIS — M79675 Pain in left toe(s): Secondary | ICD-10-CM | POA: Diagnosis not present

## 2024-01-15 DIAGNOSIS — B351 Tinea unguium: Secondary | ICD-10-CM | POA: Diagnosis not present

## 2024-01-15 DIAGNOSIS — M79674 Pain in right toe(s): Secondary | ICD-10-CM | POA: Diagnosis not present

## 2024-01-15 NOTE — Progress Notes (Unsigned)
 Subjective:  Patient ID: Kerry Matthews, male    DOB: 1936-05-03,  MRN: [id number]  Kerry Matthews presents to clinic today for:  Chief Complaint  Patient presents with   [facility]    [facility] with possible callous. Last A1c 6.3 on 12/08/23 and takes ASA 81   Patient notes nails are thick, discolored, elongated and painful in shoegear when trying to ambulate.    PCP is Cox, Burleigh Carp, MD. last seen 12/08/2023  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  ANCA antibodies- myeloperoxidase ABS-greater than 100, ANCA proteinase 3-less than 3.5 04/02/2019- ANCA titers- p-ANCA +1: 160, C ANCA-less than 1: 20, atypical p-ANCA titer-less than 1: 20   Abnormal CT of the chest    Abnormal findings on diagnostic imaging of lung 04/12/2019   04/01/2019-CT chest with contrast- no acute process in chest abdomen or pelvis, interstitial lung disease suspicious for early or mild UIP, pulmonary artery enlargement suggest PAH    Acquired trigger finger of left little finger 10/27/2022   Acute gallstone pancreatitis 09/12/2022   Adult BMI 27.0-27.9 kg/sq m 07/08/2022   Alpha-1-antitrypsin deficiency carrier 05/04/2019   Atherosclerotic heart disease of native coronary artery without angina pectoris 05/03/2018   Body mass index (BMI) 29.0-29.9, adult 01/05/2020   Choledocholithiasis 09/11/2022   Chronic kidney disease, stage 3a (HCC) 05/03/2018   Chronic respiratory failure with hypoxia (HCC) 03/06/2022   CKD (chronic kidney disease) 05/03/2018   Coronary artery disease involving native coronary artery of native heart without angina pectoris 05/03/2018   Diabetes mellitus type 2 in obese 04/01/2019   Diabetic polyneuropathy associated with type 2 diabetes mellitus (HCC) 08/12/2021   Dyslipidemia associated with type 2 diabetes mellitus (HCC) 05/03/2018   Dysuria 09/07/2020   Elevated liver enzymes 09/19/2022   Elevated rheumatoid factor 04/12/2019   04/03/2019-rheumatoid  factor-95.4   GAD (generalized anxiety disorder)    GERD without esophagitis 05/03/2018   Gouty arthritis of left great toe 07/08/2022   History of kidney stones    Hypertensive heart disease without heart failure 05/03/2018   Hyponatremia 08/12/2021   Idiopathic pulmonary fibrosis (HCC) 03/29/2021   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   Interstitial pulmonary disease (HCC) 04/12/2019   04/01/2019-CT chest with contrast- no acute process in chest abdomen or pelvis, interstitial lung disease suspicious for early or mild UIP, pulmonary artery enlargement suggest PAH  04/02/2019-connective tissue work-up: Anti-Jo 1-negative Anti-DNA antibody double-stranded-negative Anti-scleroderma antibody-negative Sjogren's syndrome antibody-negative Sjogren's syndrome antibody-negative CK-31 CCP-6 E   Leukocytosis 03/08/2020   Low vitamin B12 level 04/03/2019   Medicare annual wellness visit, subsequent 08/09/2020   Microscopic polyangiitis (HCC) 03/29/2021   Mixed diabetic hyperlipidemia associated with type 2 diabetes mellitus (HCC) 08/12/2021   Mixed hyperlipidemia 05/03/2018   Osteoarthritis    Other emphysema (HCC)    Other long term (current) drug therapy 03/29/2021   Pain in finger of left hand 11/17/2022   Peripheral polyneuropathy 01/30/2020   Pneumonia of left lower lobe due to infectious organism 10/05/2022   Polymyositis (HCC)    Recurrent UTI    Respiratory disorder concurrent with and due to microscopic polyangiitis (HCC) 08/12/2021   Rheumatoid factor positive 03/29/2021   SIRS (systemic inflammatory response syndrome) (HCC) 09/11/2022   Skin cancer    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/17/2020   Swollen L ankle 09/19/2022   Transaminitis    Type 2 diabetes mellitus with hyperglycemia, with long-term current use of insulin  (HCC) 04/23/2021  UC (ulcerative colitis) (HCC)    Urinary frequency 09/19/2022   Vasculitis (HCC) 03/29/2021   Vitamin D  deficiency 10/21/2022    Past Surgical History:  Procedure Laterality Date   ANGIOPLASTY  2010   BACK SURGERY     CATARACT EXTRACTION     COLONOSCOPY  06/16/2005   Mild colitis involving splenic flexure. Colonic polyps, status post polypectomy. Mild pancolonic diverticulitits. Internal hemorrhoids.    ERCP N/A 09/13/2022   Procedure: ENDOSCOPIC RETROGRADE CHOLANGIOPANCREATOGRAPHY (ERCP);  Surgeon: Kenney Peacemaker, MD;  Location: [facility];  Service: Gastroenterology;  Laterality: N/A;   ESOPHAGOGASTRODUODENOSCOPY  04/26/2003   Irregular Z line suggestive of GERD. Mild gastritis status post CLO testing.    IR KYPHO EA ADDL LEVEL THORACIC OR LUMBAR  04/20/2023   IR KYPHO LUMBAR INC FX REDUCE BONE BX UNI/BIL CANNULATION INC/IMAGING  04/16/2023   IR KYPHO THORACIC WITH BONE BIOPSY  04/16/2023   IR RADIOLOGIST EVAL & MGMT  03/20/2023   REMOVAL OF STONES  09/13/2022   Procedure: REMOVAL OF STONES;  Surgeon: Kenney Peacemaker, MD;  Location: [facility];  Service: Gastroenterology;;   [address]  09/13/2022   Procedure: [address];  Surgeon: Kenney Peacemaker, MD;  Location: [facility];  Service: Gastroenterology;;   TOTAL KNEE ARTHROPLASTY Left 12/17/2020   Procedure: LEFT TOTAL KNEE ARTHROPLASTY;  Surgeon: Wes Hamman, MD;  Location: [facility];  Service: Orthopedics;  Laterality: Left;   TRIGGER FINGER RELEASE     Allergies  Allergen Reactions   Ace Inhibitors Other (See Comments)    = Slow heart rate   Hydrocodone  Nausea And Vomiting   Hydrocodone -Acetaminophen  Nausea And Vomiting   Nsaids Other (See Comments)    Kidney issues   Sulfamethoxazole Nausea And Vomiting   Sulfamethoxazole-Trimethoprim Nausea And Vomiting   Trimethoprim Other (See Comments)    Reaction not recalled    Review of Systems: Negative except as noted in the HPI.  Objective:  Kerry Matthews is a pleasant 88 y.o. male in NAD. AAO x 3.  Vascular Examination: Capillary refill time is 3-5 seconds to toes bilateral. Palpable pedal  pulses b/l LE. Digital hair present b/l.  Skin temperature gradient WNL b/l. No varicosities b/l. No cyanosis noted b/l.   Dermatological Examination: Pedal skin with normal turgor, texture and tone b/l. No open wounds. No interdigital macerations b/l. Toenails x10 are 3mm thick, discolored, dystrophic with subungual debris. There is pain with compression of the nail plates.  They are elongated x10     Latest Ref Rng & Units 12/08/2023    3:38 PM 09/08/2023    3:10 PM 06/05/2023    8:50 AM 01/30/2023    9:12 AM  Hemoglobin A1C  Hemoglobin-A1c 4.8 - 5.6 % 6.3  6.1  5.9  6.1    Assessment/Plan: 1. Pain due to onychomycosis of toenails of both feet     The mycotic toenails were sharply debrided x10 with sterile nail nippers and a power debriding burr to decrease bulk/thickness and length.    Some thick, hard skin was noted near the first MPJ bilateral which was sanded with an umbrella bur as a Research officer, political party.  Return in about 3 months (around 04/16/2024) for [facility].   Joe Murders, DPM, FACFAS [facility]     [address]  [place], [place] [address]                Office [phone]  Fax [phone]

## 2024-01-19 DIAGNOSIS — M317 Microscopic polyangiitis: Secondary | ICD-10-CM | POA: Diagnosis not present

## 2024-01-28 ENCOUNTER — Encounter: Payer: Self-pay | Admitting: Cardiology

## 2024-01-28 ENCOUNTER — Ambulatory Visit: Attending: Cardiology | Admitting: Cardiology

## 2024-01-28 VITALS — BP 150/80 | HR 66 | Ht 66.0 in | Wt 171.0 lb

## 2024-01-28 DIAGNOSIS — E1169 Type 2 diabetes mellitus with other specified complication: Secondary | ICD-10-CM

## 2024-01-28 DIAGNOSIS — E785 Hyperlipidemia, unspecified: Secondary | ICD-10-CM

## 2024-01-28 DIAGNOSIS — M419 Scoliosis, unspecified: Secondary | ICD-10-CM | POA: Insufficient documentation

## 2024-01-28 DIAGNOSIS — I119 Hypertensive heart disease without heart failure: Secondary | ICD-10-CM | POA: Diagnosis not present

## 2024-01-28 DIAGNOSIS — I2510 Atherosclerotic heart disease of native coronary artery without angina pectoris: Secondary | ICD-10-CM | POA: Diagnosis not present

## 2024-01-28 DIAGNOSIS — M47816 Spondylosis without myelopathy or radiculopathy, lumbar region: Secondary | ICD-10-CM | POA: Insufficient documentation

## 2024-01-28 NOTE — Patient Instructions (Signed)
 Medication Instructions:  Your physician recommends that you continue on your current medications as directed. Please refer to the Current Medication list given to you today.  *If you need a refill on your cardiac medications before your next appointment, please call your pharmacy*   Lab Work: None ordered If you have labs (blood work) drawn today and your tests are completely normal, you will receive your results only by: MyChart Message (if you have MyChart) OR A paper copy in the mail If you have any lab test that is abnormal or we need to change your treatment, we will call you to review the results.   Testing/Procedures: None ordered   Follow-Up: At [facility], you and your health needs are our priority.  As part of our continuing mission to provide you with exceptional heart care, we have created designated Provider Care Teams.  These Care Teams include your primary Cardiologist (physician) and Advanced Practice Providers (APPs -  Physician Assistants and Nurse Practitioners) who all work together to provide you with the care you need, when you need it.  We recommend signing up for the patient portal called "MyChart".  Sign up information is provided on this After Visit Summary.  MyChart is used to connect with patients for Virtual Visits (Telemedicine).  Patients are able to view lab/test results, encounter notes, upcoming appointments, etc.  Non-urgent messages can be sent to your provider as well.   To learn more about what you can do with MyChart, go to [email or web address].    Your next appointment:   9 month(s)  The format for your next appointment:   In Person  Provider:   Belva Crome, MD    Other Instructions none  Important Information About Sugar

## 2024-01-28 NOTE — Progress Notes (Signed)
 Cardiology Office Note:    Date:  01/28/2024   ID:  Kerry Matthews, DOB July 02, 1936, MRN [id number]  PCP:  Mercy Stall, MD  Cardiologist:  Nelia Balzarine, MD   Referring MD: Mercy Stall, MD    ASSESSMENT:    1. Atherosclerosis of native coronary artery of native heart without angina pectoris   2. Dyslipidemia associated with type 2 diabetes mellitus (HCC)   3. Hypertensive heart disease without heart failure    PLAN:    In order of problems listed above:  Coronary artery disease: Secondary prevention stressed to the patient.  Importance of compliance with diet medication stressed high vocalized understanding.  He was advised to walk at least half an hour a day on a daily basis. Essential hypertension: Blood pressure stable and diet was emphasized.  Lifestyle modification urged.  Blood pressures from home are in the range of 130/70 and I reviewed the blood pressure readings brought by his wife. Mixed dyslipidemia: On lipid-lowering medications followed by primary care.  Goal LDL less than 60. Patient will be seen in follow-up appointment in 6 months or earlier if the patient has any concerns.    Medication Adjustments/Labs and Tests Ordered: Current medicines are reviewed at length with the patient today.  Concerns regarding medicines are outlined above.  Orders Placed This Encounter  Procedures   EKG 12-Lead   No orders of the defined types were placed in this encounter.    Chief Complaint  Patient presents with   Annual Exam     History of Present Illness:    Kerry Matthews is a 88 y.o. male.  Patient has past medical history of coronary artery disease, essential hypertension and mixed dyslipidemia.  He denies any problems at this time and takes care of activities of daily living.  No chest pain orthopnea or PND.  He walks his appropriately on a regular basis without any symptoms and happy about it.  At the time of my evaluation, the patient is alert awake oriented and  in no distress.  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  ANCA antibodies- myeloperoxidase ABS-greater than 100, ANCA proteinase 3-less than 3.5 04/02/2019- ANCA titers- p-ANCA +1: 160, C ANCA-less than 1: 20, atypical p-ANCA titer-less than 1: 20   Abnormal CT of the chest    Abnormal findings on diagnostic imaging of lung 04/12/2019   04/01/2019-CT chest with contrast- no acute process in chest abdomen or pelvis, interstitial lung disease suspicious for early or mild UIP, pulmonary artery enlargement suggest PAH    Acquired trigger finger of left little finger 10/27/2022   Acute gallstone pancreatitis 09/12/2022   Adult BMI 27.0-27.9 kg/sq m 07/08/2022   Alpha-1-antitrypsin deficiency carrier 05/04/2019   Atherosclerotic heart disease of native coronary artery without angina pectoris 05/03/2018   Body mass index (BMI) 29.0-29.9, adult 01/05/2020   Choledocholithiasis 09/11/2022   Chronic kidney disease, stage 3a (HCC) 05/03/2018   Chronic respiratory failure with hypoxia (HCC) 03/06/2022   CKD (chronic kidney disease) 05/03/2018   Coronary artery disease involving native coronary artery of native heart without angina pectoris 05/03/2018   Diabetes mellitus type 2 in obese 04/01/2019   Diabetic polyneuropathy associated with type 2 diabetes mellitus (HCC) 08/12/2021   Dyslipidemia associated with type 2 diabetes mellitus (HCC) 05/03/2018   Dysuria 09/07/2020   Elevated liver enzymes 09/19/2022   Elevated rheumatoid factor 04/12/2019   04/03/2019-rheumatoid factor-95.4   GAD (generalized anxiety disorder)  GERD without esophagitis 05/03/2018   Gouty arthritis of left great toe 07/08/2022   History of kidney stones    Hypertensive heart disease without heart failure 05/03/2018   Hyponatremia 08/12/2021   Idiopathic pulmonary fibrosis (HCC) 03/29/2021   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   Interstitial pulmonary disease  (HCC) 04/12/2019   04/01/2019-CT chest with contrast- no acute process in chest abdomen or pelvis, interstitial lung disease suspicious for early or mild UIP, pulmonary artery enlargement suggest PAH  04/02/2019-connective tissue work-up: Anti-Jo 1-negative Anti-DNA antibody double-stranded-negative Anti-scleroderma antibody-negative Sjogren's syndrome antibody-negative Sjogren's syndrome antibody-negative CK-31 CCP-6 E   Leukocytosis 03/08/2020   Low vitamin B12 level 04/03/2019   Medicare annual wellness visit, subsequent 08/09/2020   Microscopic polyangiitis (HCC) 03/29/2021   Mixed diabetic hyperlipidemia associated with type 2 diabetes mellitus (HCC) 08/12/2021   Mixed hyperlipidemia 05/03/2018   Osteoarthritis    Other emphysema (HCC)    Other long term (current) drug therapy 03/29/2021   Pain in finger of left hand 11/17/2022   Peripheral polyneuropathy 01/30/2020   Pneumonia of left lower lobe due to infectious organism 10/05/2022   Polymyositis (HCC)    Recurrent UTI    Respiratory disorder concurrent with and due to microscopic polyangiitis (HCC) 08/12/2021   Rheumatoid factor positive 03/29/2021   SIRS (systemic inflammatory response syndrome) (HCC) 09/11/2022   Skin cancer    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/17/2020   Swollen L ankle 09/19/2022   Transaminitis    Type 2 diabetes mellitus with hyperglycemia, with long-term current use of insulin  (HCC) 04/23/2021   UC (ulcerative colitis) (HCC)    Urinary frequency 09/19/2022   Vasculitis (HCC) 03/29/2021   Vitamin D  deficiency 10/21/2022    Past Surgical History:  Procedure Laterality Date   ANGIOPLASTY  2010   BACK SURGERY     CATARACT EXTRACTION     COLONOSCOPY  06/16/2005   Mild colitis involving splenic flexure. Colonic polyps, status post polypectomy. Mild pancolonic diverticulitits. Internal hemorrhoids.    ERCP N/A 09/13/2022   Procedure: ENDOSCOPIC RETROGRADE CHOLANGIOPANCREATOGRAPHY (ERCP);  Surgeon:  Kenney Peacemaker, MD;  Location: [facility];  Service: Gastroenterology;  Laterality: N/A;   ESOPHAGOGASTRODUODENOSCOPY  04/26/2003   Irregular Z line suggestive of GERD. Mild gastritis status post CLO testing.    IR KYPHO EA ADDL LEVEL THORACIC OR LUMBAR  04/20/2023   IR KYPHO LUMBAR INC FX REDUCE BONE BX UNI/BIL CANNULATION INC/IMAGING  04/16/2023   IR KYPHO THORACIC WITH BONE BIOPSY  04/16/2023   IR RADIOLOGIST EVAL & MGMT  03/20/2023   REMOVAL OF STONES  09/13/2022   Procedure: REMOVAL OF STONES;  Surgeon: Kenney Peacemaker, MD;  Location: [facility];  Service: Gastroenterology;;   [address]  09/13/2022   Procedure: [address];  Surgeon: Kenney Peacemaker, MD;  Location: [facility];  Service: Gastroenterology;;   TOTAL KNEE ARTHROPLASTY Left 12/17/2020   Procedure: LEFT TOTAL KNEE ARTHROPLASTY;  Surgeon: Wes Hamman, MD;  Location: [facility];  Service: Orthopedics;  Laterality: Left;   TRIGGER FINGER RELEASE      Current Medications: Current Meds  Medication Sig   Alcohol  Swabs  (DROPSAFE ALCOHOL  PREP) 70 % PADS Use new pad when checking sugar   aspirin  EC 81 MG tablet Take 1 tablet (81 mg total) by mouth daily. Swallow whole.   Blood Glucose Monitoring Suppl DEVI 1 each by Does not apply route in the morning, at noon, and at bedtime. May substitute to any manufacturer covered by patient's insurance.   cyanocobalamin  (  VITAMIN B12) 1000 MCG tablet Take 1 tablet (1,000 mcg total) by mouth daily.   Evolocumab  (REPATHA ) 140 MG/ML SOSY Inject 140 mg into the skin every 14 (fourteen) days. (Patient taking differently: Inject 140 mg into the skin every 21 ( twenty-one) days.)   furosemide  (LASIX ) 20 MG tablet TAKE 1 TABLET EVERY DAY AS NEEDED   gabapentin  (NEURONTIN ) 400 MG capsule Take 400 mg by mouth 2 (two) times daily.   Glucose Blood (BLOOD GLUCOSE TEST STRIPS) STRP 1 each by In Vitro route in the morning, at noon, and at bedtime. May substitute to any manufacturer covered by patient's  insurance.   Multiple Vitamins-Minerals (PRESERVISION AREDS 2) CAPS Take 1 capsule by mouth 2 (two) times daily after a meal.   nitroGLYCERIN  (NITROSTAT ) 0.4 MG SL tablet Place 1 tablet (0.4 mg total) under the tongue every 5 (five) minutes as needed for chest pain.   omeprazole  (PRILOSEC) 20 MG capsule Take 1 capsule (20 mg total) by mouth daily.   polyethylene glycol (MIRALAX  / GLYCOLAX ) 17 g packet Take 17 g by mouth daily.   riTUXimab  (RITUXAN ) 500 MG/50ML injection Inject 1,000 mg into the vein every 6 (six) months. 2 does 2 weeks apart     Allergies:   Ace inhibitors, Hydrocodone , Hydrocodone -acetaminophen , Nsaids, Sulfamethoxazole, Sulfamethoxazole-trimethoprim, and Trimethoprim   Social History   Socioeconomic History   Marital status: Married    Spouse name: Not on file   Number of children: 2   Years of education: Not on file   Highest education level: 8th grade  Occupational History   Occupation: retired  Tobacco Use   Smoking status: Never   Smokeless tobacco: Never  Vaping Use   Vaping status: Never Used  Substance and Sexual Activity   Alcohol  use: Never   Drug use: Never   Sexual activity: Not on file  Other Topics Concern   Not on file  Social History Narrative   Not on file   Social Drivers of Health   Financial Resource Strain: Low Risk  (12/07/2023)   Overall Financial Resource Strain (CARDIA)    Difficulty of Paying Living Expenses: Not hard at all  Food Insecurity: No Food Insecurity (12/07/2023)   Hunger Vital Sign    Worried About Running Out of Food in the Last Year: Never true    Ran Out of Food in the Last Year: Never true  Transportation Needs: No Transportation Needs (12/07/2023)   PRAPARE - Administrator, Civil Service (Medical): No    Lack of Transportation (Non-Medical): No  Physical Activity: Unknown (12/07/2023)   Exercise Vital Sign    Days of Exercise per Week: Patient declined    Minutes of Exercise per Session: 30 min   Stress: No Stress Concern Present (12/07/2023)   [employer] of Occupational Health - Occupational Stress Questionnaire    Feeling of Stress : Not at all  Social Connections: Socially Integrated (12/07/2023)   Social Connection and Isolation Panel [NHANES]    Frequency of Communication with Friends and Family: More than three times a week    Frequency of Social Gatherings with Friends and Family: Twice a week    Attends Religious Services: More than 4 times per year    Active Member of [employer] or Organizations: Yes    Attends Engineer, structural: More than 4 times per year    Marital Status: Married     Family History: The patient's family history includes Clotting disorder in his brother; Heart attack  in his sister; Lung disease in his sister; Pancreatic cancer in his sister; Stroke in his father; Tuberculosis in his mother. There is no history of Colon cancer or Esophageal cancer.  ROS:   Please see the history of present illness.    All other systems reviewed and are negative.  EKGs/Labs/Other Studies Reviewed:    The following studies were reviewed today: Is my findings with the patient at length   Recent Labs: 12/08/2023: ALT 13; BUN 15; Creatinine, Ser 1.39; Hemoglobin 15.3; Platelets 289; Potassium 5.4; Sodium 140  Recent Lipid Panel    Component Value Date/Time   CHOL 154 12/08/2023 1538   TRIG 195 (H) 12/08/2023 1538   HDL 39 (L) 12/08/2023 1538   CHOLHDL 3.9 12/08/2023 1538   LDLCALC 82 12/08/2023 1538    Physical Exam:    VS:  BP (!) 150/80   Pulse 66   Ht 5\' 6"  (1.676 m)   Wt 171 lb (77.6 kg)   SpO2 96%   BMI 27.60 kg/m     Wt Readings from Last 3 Encounters:  01/28/24 171 lb (77.6 kg)  12/08/23 173 lb (78.5 kg)  10/09/23 172 lb (78 kg)     GEN: Patient is in no acute distress HEENT: Normal NECK: No JVD; No carotid bruits LYMPHATICS: No lymphadenopathy CARDIAC: Hear sounds regular, 2/6 systolic murmur at the apex. RESPIRATORY:  Clear  to auscultation without rales, wheezing or rhonchi  ABDOMEN: Soft, non-tender, non-distended MUSCULOSKELETAL:  No edema; No deformity  SKIN: Warm and dry NEUROLOGIC:  Alert and oriented x 3 PSYCHIATRIC:  Normal affect   Signed, Nelia Balzarine, MD  01/28/2024 12:05 PM    [facility]

## 2024-03-30 DIAGNOSIS — J8410 Pulmonary fibrosis, unspecified: Secondary | ICD-10-CM | POA: Diagnosis not present

## 2024-03-30 DIAGNOSIS — Z79899 Other long term (current) drug therapy: Secondary | ICD-10-CM | POA: Diagnosis not present

## 2024-03-30 DIAGNOSIS — R768 Other specified abnormal immunological findings in serum: Secondary | ICD-10-CM | POA: Diagnosis not present

## 2024-03-30 DIAGNOSIS — M317 Microscopic polyangiitis: Secondary | ICD-10-CM | POA: Diagnosis not present

## 2024-03-30 DIAGNOSIS — R5383 Other fatigue: Secondary | ICD-10-CM | POA: Diagnosis not present

## 2024-03-30 DIAGNOSIS — Z6826 Body mass index (BMI) 26.0-26.9, adult: Secondary | ICD-10-CM | POA: Diagnosis not present

## 2024-03-30 DIAGNOSIS — E663 Overweight: Secondary | ICD-10-CM | POA: Diagnosis not present

## 2024-03-30 DIAGNOSIS — J84112 Idiopathic pulmonary fibrosis: Secondary | ICD-10-CM | POA: Diagnosis not present

## 2024-04-14 ENCOUNTER — Ambulatory Visit: Admitting: Podiatry

## 2024-04-14 ENCOUNTER — Ambulatory Visit (INDEPENDENT_AMBULATORY_CARE_PROVIDER_SITE_OTHER)

## 2024-04-14 VITALS — Ht 66.0 in | Wt 171.0 lb

## 2024-04-14 DIAGNOSIS — B351 Tinea unguium: Secondary | ICD-10-CM

## 2024-04-14 DIAGNOSIS — M79675 Pain in left toe(s): Secondary | ICD-10-CM

## 2024-04-14 DIAGNOSIS — M79674 Pain in right toe(s): Secondary | ICD-10-CM | POA: Diagnosis not present

## 2024-04-14 DIAGNOSIS — Z0000 Encounter for general adult medical examination without abnormal findings: Secondary | ICD-10-CM

## 2024-04-14 NOTE — Progress Notes (Signed)
 Subjective:   Kerry Matthews is a 88 y.o. who presents for a Medicare Wellness preventive visit.  As a reminder, Annual Wellness Visits don't include a physical exam, and some assessments may be limited, especially if this visit is performed virtually. We may recommend an in-person follow-up visit with your provider if needed.  Visit Complete: Virtual I connected with  Alm Bowers on 04/14/24 by a audio enabled telemedicine application and verified that I am speaking with the correct person using two identifiers.  Patient Location: Home  Provider Location: Home Office  I discussed the limitations of evaluation and management by telemedicine. The patient expressed understanding and agreed to proceed.  Vital Signs: Because this visit was a virtual/telehealth visit, some criteria may be missing or patient reported. Any vitals not documented were not able to be obtained and vitals that have been documented are patient reported.  VideoDeclined- This patient declined Librarian, academic. Therefore the visit was completed with audio only.  Persons Participating in Visit: Patient assisted by wife.  AWV Questionnaire: Yes: Patient Medicare AWV questionnaire was completed by the patient on 04/13/24; I have confirmed that all information answered by patient is correct and no changes since this date.  Cardiac Risk Factors include: advanced age (>11men, >30 women);diabetes mellitus;hypertension;male gender;sedentary lifestyle     Objective:    Today's Vitals   04/14/24 0856  Weight: 171 lb (77.6 kg)  Height: 5' 6 (1.676 m)   Body mass index is 27.6 kg/m.     04/14/2024    9:01 AM 04/16/2023    7:38 AM 04/09/2023   10:43 AM 09/11/2022   11:37 AM 08/13/2021    6:00 PM 08/13/2021    7:25 AM 12/17/2020    5:45 AM  Advanced Directives  Does Patient Have a Medical Advance Directive? No No No No No No No  Would patient like information on creating a medical  advance directive? Yes (MAU/Ambulatory/Procedural Areas - Information given) No - Patient declined No - Patient declined No - Patient declined No - Patient declined No - Patient declined No - Patient declined    Current Medications (verified) Outpatient Encounter Medications as of 04/14/2024  Medication Sig   Alcohol  Swabs  (DROPSAFE ALCOHOL  PREP) 70 % PADS Use new pad when checking sugar   aspirin  EC 81 MG tablet Take 1 tablet (81 mg total) by mouth daily. Swallow whole.   Blood Glucose Monitoring Suppl DEVI 1 each by Does not apply route in the morning, at noon, and at bedtime. May substitute to any manufacturer covered by patient's insurance.   cyanocobalamin  (VITAMIN B12) 1000 MCG tablet Take 1 tablet (1,000 mcg total) by mouth daily.   Evolocumab  (REPATHA ) 140 MG/ML SOSY Inject 140 mg into the skin every 14 (fourteen) days. (Patient taking differently: Inject 140 mg into the skin every 21 ( twenty-one) days.)   furosemide  (LASIX ) 20 MG tablet TAKE 1 TABLET EVERY DAY AS NEEDED   gabapentin  (NEURONTIN ) 400 MG capsule Take 400 mg by mouth 2 (two) times daily.   Glucose Blood (BLOOD GLUCOSE TEST STRIPS) STRP 1 each by In Vitro route in the morning, at noon, and at bedtime. May substitute to any manufacturer covered by patient's insurance.   Multiple Vitamins-Minerals (PRESERVISION AREDS 2) CAPS Take 1 capsule by mouth 2 (two) times daily after a meal.   nitroGLYCERIN  (NITROSTAT ) 0.4 MG SL tablet Place 1 tablet (0.4 mg total) under the tongue every 5 (five) minutes as needed for chest pain.   omeprazole  (  PRILOSEC) 20 MG capsule Take 1 capsule (20 mg total) by mouth daily.   polyethylene glycol (MIRALAX  / GLYCOLAX ) 17 g packet Take 17 g by mouth daily.   riTUXimab  (RITUXAN ) 500 MG/50ML injection Inject 1,000 mg into the vein every 6 (six) months. 2 does 2 weeks apart   No facility-administered encounter medications on file as of 04/14/2024.    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  ANCA antibodies- myeloperoxidase ABS-greater than 100, ANCA proteinase 3-less than 3.5 04/02/2019- ANCA titers- p-ANCA +1: 160, C ANCA-less than 1: 20, atypical p-ANCA titer-less than 1: 20   Abnormal CT of the chest    Abnormal findings on diagnostic imaging of lung 04/12/2019   04/01/2019-CT chest with contrast- no acute process in chest abdomen or pelvis, interstitial lung disease suspicious for early or mild UIP, pulmonary artery enlargement suggest PAH    Acquired trigger finger of left little finger 10/27/2022   Acute gallstone pancreatitis 09/12/2022   Adult BMI 27.0-27.9 kg/sq m 07/08/2022   Alpha-1-antitrypsin deficiency carrier 05/04/2019   Atherosclerotic heart disease of native coronary artery without angina pectoris 05/03/2018   Body mass index (BMI) 29.0-29.9, adult 01/05/2020   Choledocholithiasis 09/11/2022   Chronic kidney disease, stage 3a (HCC) 05/03/2018   Chronic respiratory failure with hypoxia (HCC) 03/06/2022   CKD (chronic kidney disease) 05/03/2018   Coronary artery disease involving native coronary artery of native heart without angina pectoris 05/03/2018   Diabetes mellitus type 2 in obese 04/01/2019   Diabetic polyneuropathy associated with type 2 diabetes mellitus (HCC) 08/12/2021   Dyslipidemia associated with type 2 diabetes mellitus (HCC) 05/03/2018   Dysuria 09/07/2020   Elevated liver enzymes 09/19/2022   Elevated rheumatoid factor 04/12/2019   04/03/2019-rheumatoid factor-95.4   GAD (generalized anxiety disorder)    GERD without esophagitis 05/03/2018   Gouty arthritis of left great toe 07/08/2022   History of kidney stones    Hypertension    Hypertensive heart disease without heart failure 05/03/2018   Hyponatremia 08/12/2021   Idiopathic pulmonary  fibrosis (HCC) 03/29/2021   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   Interstitial pulmonary disease (HCC) 04/12/2019   04/01/2019-CT chest with contrast- no acute process in chest abdomen or pelvis, interstitial lung disease suspicious for early or mild UIP, pulmonary artery enlargement suggest PAH  04/02/2019-connective tissue work-up: Anti-Jo 1-negative Anti-DNA antibody double-stranded-negative Anti-scleroderma antibody-negative Sjogren's syndrome antibody-negative Sjogren's syndrome antibody-negative CK-31 CCP-6 E   Leukocytosis 03/08/2020   Low vitamin B12 level 04/03/2019   Medicare annual wellness visit, subsequent 08/09/2020   Microscopic polyangiitis (HCC) 03/29/2021   Mixed diabetic hyperlipidemia associated with type 2 diabetes mellitus (HCC) 08/12/2021   Mixed hyperlipidemia 05/03/2018   Osteoarthritis    Other emphysema (HCC)    Other long term (current) drug therapy 03/29/2021   Pain in finger of left hand 11/17/2022   Peripheral polyneuropathy 01/30/2020   Pneumonia of left lower lobe due to infectious organism 10/05/2022   Polymyositis (HCC)    Recurrent UTI    Respiratory disorder concurrent with and due to microscopic polyangiitis (HCC) 08/12/2021   Rheumatoid factor positive 03/29/2021   SIRS (systemic inflammatory response syndrome) (HCC) 09/11/2022   Skin cancer    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/17/2020   Swollen L ankle 09/19/2022   Transaminitis    Type 2 diabetes mellitus with hyperglycemia, with long-term current use of  insulin  (HCC) 04/23/2021   UC (ulcerative colitis) (HCC)    Urinary frequency 09/19/2022   Vasculitis (HCC) 03/29/2021   Vitamin D  deficiency 10/21/2022   Past Surgical History:  Procedure Laterality Date   ANGIOPLASTY  2010   BACK SURGERY     CATARACT EXTRACTION     COLONOSCOPY  06/16/2005   Mild colitis involving splenic flexure. Colonic polyps, status post polypectomy. Mild pancolonic diverticulitits. Internal  hemorrhoids.    ERCP N/A 09/13/2022   Procedure: ENDOSCOPIC RETROGRADE CHOLANGIOPANCREATOGRAPHY (ERCP);  Surgeon: Avram Lupita BRAVO, MD;  Location: [facility];  Service: Gastroenterology;  Laterality: N/A;   ESOPHAGOGASTRODUODENOSCOPY  04/26/2003   Irregular Z line suggestive of GERD. Mild gastritis status post CLO testing.    IR KYPHO EA ADDL LEVEL THORACIC OR LUMBAR  04/20/2023   IR KYPHO LUMBAR INC FX REDUCE BONE BX UNI/BIL CANNULATION INC/IMAGING  04/16/2023   IR KYPHO THORACIC WITH BONE BIOPSY  04/16/2023   IR RADIOLOGIST EVAL & MGMT  03/20/2023   REMOVAL OF STONES  09/13/2022   Procedure: REMOVAL OF STONES;  Surgeon: Avram Lupita BRAVO, MD;  Location: [facility];  Service: Gastroenterology;;   ANNETT  09/13/2022   Procedure: ANNETT;  Surgeon: Avram Lupita BRAVO, MD;  Location: [facility];  Service: Gastroenterology;;   TOTAL KNEE ARTHROPLASTY Left 12/17/2020   Procedure: LEFT TOTAL KNEE ARTHROPLASTY;  Surgeon: Jerri Kay HERO, MD;  Location: [facility];  Service: Orthopedics;  Laterality: Left;   TRIGGER FINGER RELEASE     Family History  Problem Relation Age of Onset   Tuberculosis Mother    Stroke Father    Pancreatic cancer Sister    Heart attack Sister    Lung disease Sister    Clotting disorder Brother    Colon cancer Neg Hx    Esophageal cancer Neg Hx    Social History   Socioeconomic History   Marital status: Married    Spouse name: Not on file   Number of children: 2   Years of education: Not on file   Highest education level: 8th grade  Occupational History   Occupation: retired  Tobacco Use   Smoking status: Never   Smokeless tobacco: Never  Vaping Use   Vaping status: Never Used  Substance and Sexual Activity   Alcohol  use: Never   Drug use: Never   Sexual activity: Not on file  Other Topics Concern   Not on file  Social History Narrative   Not on file   Social Drivers of Health   Financial Resource Strain: Low Risk  (04/13/2024)   Overall  Financial Resource Strain (CARDIA)    Difficulty of Paying Living Expenses: Not hard at all  Food Insecurity: No Food Insecurity (04/13/2024)   Hunger Vital Sign    Worried About Running Out of Food in the Last Year: Never true    Ran Out of Food in the Last Year: Never true  Transportation Needs: No Transportation Needs (04/13/2024)   PRAPARE - Administrator, Civil Service (Medical): No    Lack of Transportation (Non-Medical): No  Physical Activity: Inactive (04/13/2024)   Exercise Vital Sign    Days of Exercise per Week: 0 days    Minutes of Exercise per Session: 0 min  Stress: No Stress Concern Present (04/13/2024)   [employer] of Occupational Health - Occupational Stress Questionnaire    Feeling of Stress: Not at all  Social Connections: Socially Integrated (04/13/2024)   Social Connection and Isolation Panel  Frequency of Communication with Friends and Family: More than three times a week    Frequency of Social Gatherings with Friends and Family: Once a week    Attends Religious Services: More than 4 times per year    Active Member of [employer] or Organizations: Yes    Attends Engineer, structural: More than 4 times per year    Marital Status: Married    Tobacco Counseling Counseling given: Not Answered    Clinical Intake:  Pre-visit preparation completed: Yes  Pain : No/denies pain  Diabetes: Yes CBG done?: No Did pt. bring in CBG monitor from home?: No  Lab Results  Component Value Date   HGBA1C 6.3 (H) 12/08/2023   HGBA1C 6.1 (H) 09/08/2023   HGBA1C 5.9 (H) 06/05/2023     How often do you need to have someone help you when you read instructions, pamphlets, or other written materials from your doctor or pharmacy?: 1 - Never  Interpreter Needed?: No  Information entered by :: Charmaine Bloodgood LPN   Activities of Daily Living     04/14/2024    8:56 AM  In your present state of health, do you have any difficulty performing the  following activities:  Hearing? 0  Vision? 0  Difficulty concentrating or making decisions? 0  Walking or climbing stairs? 1  Dressing or bathing? 0  Doing errands, shopping? 1  Preparing Food and eating ? N  Using the Toilet? N  In the past six months, have you accidently leaked urine? N  Do you have problems with loss of bowel control? N  Managing your Medications? N  Managing your Finances? N  Housekeeping or managing your Housekeeping? Y    Patient Care Team: Sherre Clapper, MD as PCP - General (Internal Medicine) Mannam, Praveen, MD as Consulting Physician (Pulmonary Disease) Revankar, Jennifer SAUNDERS, MD as Consulting Physician (Cardiology) Nyle Rankin POUR, [facility] (Inactive) (Pharmacist) Ishmael Slough, MD as Consulting Physician (Rheumatology) Erasmo Bernardino BRAVO, OD (Optometry) McCaughan, Awanda BIRCH, DPM as Consulting Physician (Podiatry)  I have updated your Care Teams any recent Medical Services you may have received from other providers in the past year.     Assessment:   This is a routine wellness examination for Louise.  Hearing/Vision screen Hearing Screening - Comments:: Some hearing loss  Vision Screening - Comments:: Wears rx glasses - up to date with routine eye exams with Dr. Erasmo    Goals Addressed             This Visit's Progress    Prevent falls         Depression Screen     04/14/2024    9:01 AM 06/05/2023    8:19 AM 04/09/2023   10:40 AM 01/30/2023    8:25 AM 07/08/2022    8:31 AM 04/08/2022    9:34 AM 03/31/2022    7:43 AM  PHQ 2/9 Scores  PHQ - 2 Score 0 0 0 0 0 0 0  PHQ- 9 Score    3       Fall Risk     04/14/2024    9:02 AM 12/08/2023    2:47 PM 10/09/2023   10:43 AM 06/05/2023    8:19 AM 04/09/2023   10:42 AM  Fall Risk   Falls in the past year? 0 0 0 1 0  Number falls in past yr: 0 0 0 0 0  Injury with Fall? 0 0 0 1 0  Risk for fall due to : Impaired  mobility   History of fall(s) No Fall Risks  Follow up Falls prevention discussed;Education  provided;Falls evaluation completed Falls evaluation completed  Falls evaluation completed;Falls prevention discussed Falls prevention discussed    MEDICARE RISK AT HOME:  Medicare Risk at Home Any stairs in or around the home?: No If so, are there any without handrails?: No Home free of loose throw rugs in walkways, pet beds, electrical cords, etc?: Yes Adequate lighting in your home to reduce risk of falls?: Yes Life alert?: No Use of a cane, walker or w/c?: No Grab bars in the bathroom?: Yes Shower chair or bench in shower?: No Elevated toilet seat or a handicapped toilet?: Yes  TIMED UP AND GO:  Was the test performed?  No  Cognitive Function: Declined/Normal: No cognitive concerns noted by patient or family. Patient alert, oriented, able to answer questions appropriately and recall recent events. No signs of memory loss or confusion.    08/09/2020    1:53 PM  MMSE - Mini Mental State Exam  Orientation to time 5  Orientation to Place 5  Registration 3  Attention/ Calculation 3  Recall 3  Language- name 2 objects 2  Language- repeat 0  Language- follow 3 step command 3  Language- read & follow direction 1  Write a sentence 0  Copy design 1  Total score 26        04/09/2023   10:43 AM 04/08/2022    9:38 AM 08/09/2020    1:48 PM  6CIT Screen  What Year? 0 points 0 points 0 points  What month? 0 points 0 points 0 points  What time? 0 points 0 points 0 points  Count back from 20 0 points 4 points 4 points  Months in reverse 0 points 2 points 2 points  Repeat phrase 0 points 6 points 6 points  Total Score 0 points 12 points 12 points    Immunizations Immunization History  Administered Date(s) Administered   Fluad Quad(high Dose 65+) 05/23/2022   Fluad Trivalent(High Dose 65+) 06/05/2023   PFIZER(Purple Top)SARS-COV-2 Vaccination 10/17/2019, 11/07/2019, 06/05/2020   PNEUMOCOCCAL CONJUGATE-20 07/08/2022   Pfizer(Comirnaty)Fall Seasonal Vaccine 12 years and older  05/29/2022, 06/05/2023   Respiratory Syncytial Virus Vaccine,Recomb Aduvanted(Arexvy) 05/29/2022    Screening Tests Health Maintenance  Topic Date Due   DTaP/Tdap/Td (1 - Tdap) Never done   Zoster Vaccines- Shingrix (1 of 2) Never done   COVID-19 Vaccine (6 - Pfizer risk 2024-25 season) 12/04/2023   OPHTHALMOLOGY EXAM  12/16/2023   INFLUENZA VACCINE  03/25/2024   HEMOGLOBIN A1C  06/08/2024   FOOT EXAM  12/07/2024   Medicare Annual Wellness (AWV)  04/14/2025   Pneumococcal Vaccine: 50+ Years  Completed   HPV VACCINES  Aged Out   Meningococcal B Vaccine  Aged Out    Health Maintenance  Health Maintenance Due  Topic Date Due   DTaP/Tdap/Td (1 - Tdap) Never done   Zoster Vaccines- Shingrix (1 of 2) Never done   COVID-19 Vaccine (6 - Pfizer risk 2024-25 season) 12/04/2023   OPHTHALMOLOGY EXAM  12/16/2023   INFLUENZA VACCINE  03/25/2024   Health Maintenance Items Addressed: Information provided on recommended vaccines   Additional Screening:  Vision Screening: Recommended annual ophthalmology exams for early detection of glaucoma and other disorders of the eye. Would you like a referral to an eye doctor? No    Dental Screening: Recommended annual dental exams for proper oral hygiene  Community Resource Referral / Chronic Care Management: CRR required this visit?  No   CCM required this visit?  No   Plan:    I have personally reviewed and noted the following in the patient's chart:   Medical and social history Use of alcohol , tobacco or illicit drugs  Current medications and supplements including opioid prescriptions. Patient is not currently taking opioid prescriptions. Functional ability and status Nutritional status Physical activity Advanced directives List of other physicians Hospitalizations, surgeries, and ER visits in previous 12 months Vitals Screenings to include cognitive, depression, and falls Referrals and appointments  In addition, I have  reviewed and discussed with patient certain preventive protocols, quality metrics, and best practice recommendations. A written personalized care plan for preventive services as well as general preventive health recommendations were provided to patient.   Lavelle Pfeiffer [place], [place]   [id number]   After Visit Summary: (MyChart) Due to this being a telephonic visit, the after visit summary with patients personalized plan was offered to patient via MyChart   Notes: PCP Follow Up Recommendations: Patient complaining of increased cough and fluid; he started taking Furosemide  2 days ago.  Acute visit scheduled for 04/15/24

## 2024-04-14 NOTE — Progress Notes (Signed)
 Acute Office Visit  Subjective:    Patient ID: Kerry Matthews, male    DOB: May 23, 1936, 88 y.o.   MRN: [id number]  Chief Complaint  Patient presents with   URI    HPI: Patient is in today for URI symptoms. Complain of productive cough yellow/clear in color x 4 days. Has not taken anything for the cough. No drainage, fever or chills.  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  ANCA antibodies- myeloperoxidase ABS-greater than 100, ANCA proteinase 3-less than 3.5 04/02/2019- ANCA titers- p-ANCA +1: 160, C ANCA-less than 1: 20, atypical p-ANCA titer-less than 1: 20   Abnormal CT of the chest    Abnormal findings on diagnostic imaging of lung 04/12/2019   04/01/2019-CT chest with contrast- no acute process in chest abdomen or pelvis, interstitial lung disease suspicious for early or mild UIP, pulmonary artery enlargement suggest PAH    Acquired trigger finger of left little finger 10/27/2022   Acute gallstone pancreatitis 09/12/2022   Adult BMI 27.0-27.9 kg/sq m 07/08/2022   Alpha-1-antitrypsin deficiency carrier 05/04/2019   Atherosclerotic heart disease of native coronary artery without angina pectoris 05/03/2018   Body mass index (BMI) 29.0-29.9, adult 01/05/2020   Choledocholithiasis 09/11/2022   Chronic kidney disease, stage 3a (HCC) 05/03/2018   Chronic respiratory failure with hypoxia (HCC) 03/06/2022   CKD (chronic kidney disease) 05/03/2018   Coronary artery disease involving native coronary artery of native heart without angina pectoris 05/03/2018   Diabetes mellitus type 2 in obese 04/01/2019   Diabetic polyneuropathy associated with type 2 diabetes mellitus (HCC) 08/12/2021   Dyslipidemia associated with type 2 diabetes mellitus (HCC) 05/03/2018   Dysuria 09/07/2020   Elevated liver enzymes 09/19/2022   Elevated rheumatoid factor 04/12/2019   04/03/2019-rheumatoid factor-95.4   GAD (generalized anxiety disorder)    GERD  without esophagitis 05/03/2018   Gouty arthritis of left great toe 07/08/2022   History of kidney stones    Hypertension    Hypertensive heart disease without heart failure 05/03/2018   Hyponatremia 08/12/2021   Idiopathic pulmonary fibrosis (HCC) 03/29/2021   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   Interstitial pulmonary disease (HCC) 04/12/2019   04/01/2019-CT chest with contrast- no acute process in chest abdomen or pelvis, interstitial lung disease suspicious for early or mild UIP, pulmonary artery enlargement suggest PAH  04/02/2019-connective tissue work-up: Anti-Jo 1-negative Anti-DNA antibody double-stranded-negative Anti-scleroderma antibody-negative Sjogren's syndrome antibody-negative Sjogren's syndrome antibody-negative CK-31 CCP-6 E   Leukocytosis 03/08/2020   Low vitamin B12 level 04/03/2019   Medicare annual wellness visit, subsequent 08/09/2020   Microscopic polyangiitis (HCC) 03/29/2021   Mixed diabetic hyperlipidemia associated with type 2 diabetes mellitus (HCC) 08/12/2021   Mixed hyperlipidemia 05/03/2018   Osteoarthritis    Other emphysema (HCC)    Other long term (current) drug therapy 03/29/2021   Pain in finger of left hand 11/17/2022   Peripheral polyneuropathy 01/30/2020   Pneumonia of left lower lobe due to infectious organism 10/05/2022   Polymyositis (HCC)    Recurrent UTI    Respiratory disorder concurrent with and due to microscopic polyangiitis (HCC) 08/12/2021   Rheumatoid factor positive 03/29/2021   SIRS (systemic inflammatory response syndrome) (HCC) 09/11/2022   Skin cancer    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/17/2020   Swollen L ankle 09/19/2022   Transaminitis    Type 2 diabetes mellitus with hyperglycemia, with long-term current use of insulin  (HCC) 04/23/2021   UC (ulcerative colitis) (HCC)  Urinary frequency 09/19/2022   Vasculitis (HCC) 03/29/2021   Vitamin D  deficiency 10/21/2022    Past Surgical History:  Procedure  Laterality Date   ANGIOPLASTY  2010   BACK SURGERY     CATARACT EXTRACTION     COLONOSCOPY  06/16/2005   Mild colitis involving splenic flexure. Colonic polyps, status post polypectomy. Mild pancolonic diverticulitits. Internal hemorrhoids.    ERCP N/A 09/13/2022   Procedure: ENDOSCOPIC RETROGRADE CHOLANGIOPANCREATOGRAPHY (ERCP);  Surgeon: Avram Lupita BRAVO, MD;  Location: [facility];  Service: Gastroenterology;  Laterality: N/A;   ESOPHAGOGASTRODUODENOSCOPY  04/26/2003   Irregular Z line suggestive of GERD. Mild gastritis status post CLO testing.    IR KYPHO EA ADDL LEVEL THORACIC OR LUMBAR  04/20/2023   IR KYPHO LUMBAR INC FX REDUCE BONE BX UNI/BIL CANNULATION INC/IMAGING  04/16/2023   IR KYPHO THORACIC WITH BONE BIOPSY  04/16/2023   IR RADIOLOGIST EVAL & MGMT  03/20/2023   REMOVAL OF STONES  09/13/2022   Procedure: REMOVAL OF STONES;  Surgeon: Avram Lupita BRAVO, MD;  Location: [facility];  Service: Gastroenterology;;   ANNETT  09/13/2022   Procedure: ANNETT;  Surgeon: Avram Lupita BRAVO, MD;  Location: [facility];  Service: Gastroenterology;;   TOTAL KNEE ARTHROPLASTY Left 12/17/2020   Procedure: LEFT TOTAL KNEE ARTHROPLASTY;  Surgeon: Jerri Kay HERO, MD;  Location[facility];  Service: Orthopedics;  Laterality: Left;   TRIGGER FINGER RELEASE      Family History  Problem Relation Age of Onset   Tuberculosis Mother    Stroke Father    Pancreatic cancer Sister    Heart attack Sister    Lung disease Sister    Clotting disorder Brother    Colon cancer Neg Hx    Esophageal cancer Neg Hx     Social History   Socioeconomic History   Marital status: Married    Spouse name: Not on file   Number of children: 2   Years of education: Not on file   Highest education level: 8th grade  Occupational History   Occupation: retired  Tobacco Use   Smoking status: Never   Smokeless tobacco: Never  Vaping Use   Vaping status: Never Used  Substance and Sexual Activity   Alcohol  use:  Never   Drug use: Never   Sexual activity: Not on file  Other Topics Concern   Not on file  Social History Narrative   Not on file   Social Drivers of Health   Financial Resource Strain: Low Risk  (04/13/2024)   Overall Financial Resource Strain (CARDIA)    Difficulty of Paying Living Expenses: Not hard at all  Food Insecurity: No Food Insecurity (04/13/2024)   Hunger Vital Sign    Worried About Running Out of Food in the Last Year: Never true    Ran Out of Food in the Last Year: Never true  Transportation Needs: No Transportation Needs (04/13/2024)   PRAPARE - Administrator, Civil Service (Medical): No    Lack of Transportation (Non-Medical): No  Physical Activity: Inactive (04/13/2024)   Exercise Vital Sign    Days of Exercise per Week: 0 days    Minutes of Exercise per Session: 0 min  Stress: No Stress Concern Present (04/13/2024)   [employer] of Occupational Health - Occupational Stress Questionnaire    Feeling of Stress: Not at all  Social Connections: Socially Integrated (04/13/2024)   Social Connection and Isolation Panel    Frequency of Communication with Friends and Family: More than  three times a week    Frequency of Social Gatherings with Friends and Family: Once a week    Attends Religious Services: More than 4 times per year    Active Member of [employer] or Organizations: Yes    Attends Engineer, structural: More than 4 times per year    Marital Status: Married  Catering manager Violence: Not At Risk (04/14/2024)   Humiliation, Afraid, Rape, and Kick questionnaire    Fear of Current or Ex-Partner: No    Emotionally Abused: No    Physically Abused: No    Sexually Abused: No    Outpatient Medications Prior to Visit  Medication Sig Dispense Refill   Alcohol  Swabs  (DROPSAFE ALCOHOL  PREP) 70 % PADS Use new pad when checking sugar 400 each 3   aspirin  EC 81 MG tablet Take 1 tablet (81 mg total) by mouth daily. Swallow whole. 30 tablet 11   Blood  Glucose Monitoring Suppl DEVI 1 each by Does not apply route in the morning, at noon, and at bedtime. May substitute to any manufacturer covered by patient's insurance. 1 each 0   cyanocobalamin  (VITAMIN B12) 1000 MCG tablet Take 1 tablet (1,000 mcg total) by mouth daily. 90 tablet 1   Evolocumab  (REPATHA ) 140 MG/ML SOSY Inject 140 mg into the skin every 14 (fourteen) days. 6 mL 1   gabapentin  (NEURONTIN ) 400 MG capsule Take 400 mg by mouth 2 (two) times daily.     Glucose Blood (BLOOD GLUCOSE TEST STRIPS) STRP 1 each by In Vitro route in the morning, at noon, and at bedtime. May substitute to any manufacturer covered by patient's insurance. 100 strip 3   Multiple Vitamins-Minerals (PRESERVISION AREDS 2) CAPS Take 1 capsule by mouth 2 (two) times daily after a meal.     nitroGLYCERIN  (NITROSTAT ) 0.4 MG SL tablet Place 1 tablet (0.4 mg total) under the tongue every 5 (five) minutes as needed for chest pain. 25 tablet 9   omeprazole  (PRILOSEC) 20 MG capsule Take 1 capsule (20 mg total) by mouth daily. 90 capsule 3   polyethylene glycol (MIRALAX  / GLYCOLAX ) 17 g packet Take 17 g by mouth daily.     riTUXimab  (RITUXAN ) 500 MG/50ML injection Inject 1,000 mg into the vein every 6 (six) months. 2 does 2 weeks apart     furosemide  (LASIX ) 20 MG tablet TAKE 1 TABLET EVERY DAY AS NEEDED 90 tablet 1   No facility-administered medications prior to visit.    Allergies  Allergen Reactions   Ace Inhibitors Other (See Comments)    = Slow heart rate   Hydrocodone  Nausea And Vomiting   Hydrocodone -Acetaminophen  Nausea And Vomiting   Nsaids Other (See Comments)    Kidney issues   Sulfamethoxazole Nausea And Vomiting   Sulfamethoxazole-Trimethoprim Nausea And Vomiting   Trimethoprim Other (See Comments)    Reaction not recalled    Review of Systems  Constitutional:  Negative for chills, fatigue and fever.  HENT:  Negative for congestion, ear pain, postnasal drip, rhinorrhea, sinus pressure, sinus pain and  sore throat.   Respiratory:  Positive for cough. Negative for shortness of breath.   Cardiovascular:  Negative for chest pain.  Gastrointestinal:  Negative for diarrhea, nausea and vomiting.  Neurological:  Negative for dizziness and headaches.       Objective:        04/21/2024    8:36 AM 04/15/2024    9:36 AM 04/14/2024    8:56 AM  Vitals with BMI  Height 5' 6  5' 6 5' 6  Weight 170 lbs 172 lbs 171 lbs  BMI 27.45 27.77 27.61  Systolic 124 122 --  Diastolic 80 72 --  Pulse 80 68     No data found.   Physical Exam Constitutional:      Appearance: Normal appearance.  HENT:     Right Ear: Tympanic membrane, ear canal and external ear normal.     Left Ear: Tympanic membrane, ear canal and external ear normal.     Nose: Nose normal. No congestion or rhinorrhea.     Mouth/Throat:     Mouth: Mucous membranes are moist.     Pharynx: No oropharyngeal exudate or posterior oropharyngeal erythema.  Cardiovascular:     Rate and Rhythm: Normal rate and regular rhythm.     Heart sounds: Normal heart sounds.  Pulmonary:     Effort: Pulmonary effort is normal. No respiratory distress.     Breath sounds: Wheezing and rales present. No rhonchi.  Lymphadenopathy:     Cervical: No cervical adenopathy.  Neurological:     Mental Status: He is alert.  Psychiatric:        Mood and Affect: Mood normal.        Behavior: Behavior normal.     Health Maintenance Due  Topic Date Due   OPHTHALMOLOGY EXAM  12/16/2023   COVID-19 Vaccine (6 - Pfizer risk 2024-25 season) 04/25/2024    There are no preventive care reminders to display for this patient.   Lab Results  Component Value Date   TSH 2.028 09/11/2022   Lab Results  Component Value Date   WBC 10.1 12/08/2023   HGB 15.3 12/08/2023   HCT 46.4 12/08/2023   MCV 90 12/08/2023   PLT 289 12/08/2023   Lab Results  Component Value Date   NA 140 12/08/2023   K 5.4 (H) 12/08/2023   CO2 24 12/08/2023   GLUCOSE 117 (H)  12/08/2023   BUN 15 12/08/2023   CREATININE 1.39 (H) 12/08/2023   BILITOT 0.5 12/08/2023   ALKPHOS 190 (H) 12/08/2023   AST 19 12/08/2023   ALT 13 12/08/2023   PROT 6.4 12/08/2023   ALBUMIN 4.3 12/08/2023   CALCIUM  9.3 12/08/2023   ANIONGAP 9 04/16/2023   EGFR 49 (L) 12/08/2023   GFR 40.27 (L) 02/19/2022   Lab Results  Component Value Date   CHOL 154 12/08/2023   Lab Results  Component Value Date   HDL 39 (L) 12/08/2023   Lab Results  Component Value Date   LDLCALC 82 12/08/2023   Lab Results  Component Value Date   TRIG 195 (H) 12/08/2023   Lab Results  Component Value Date   CHOLHDL 3.9 12/08/2023   Lab Results  Component Value Date   HGBA1C 6.3 (H) 12/08/2023       Assessment & Plan:  Acute bronchitis due to other specified organisms Assessment & Plan: Sent Augmentin  875 mg BID. Order Chest xray. Kenalog  80 mg IM #1 and rocephin  1 G IM #1 was given at the office.  Orders: -     DG Chest 2 View; Future -     cefTRIAXone  Sodium -     Triamcinolone  Acetonide -     Amoxicillin -Pot Clavulanate; Take 1 tablet by mouth 2 (two) times daily.  Dispense: 20 tablet; Refill: 0  Interstitial pulmonary disease (HCC) Assessment & Plan: Ordering Chest xray.  Orders: -     DG Chest 2 View; Future -     cefTRIAXone  Sodium -  Triamcinolone  Acetonide     Meds ordered this encounter  Medications   cefTRIAXone  (ROCEPHIN ) injection 1 g   triamcinolone  acetonide (KENALOG -40) injection 80 mg   amoxicillin -clavulanate (AUGMENTIN ) 875-125 MG tablet    Sig: Take 1 tablet by mouth 2 (two) times daily.    Dispense:  20 tablet    Refill:  0    Orders Placed This Encounter  Procedures   DG Chest 2 View     Follow-up: Return in about 6 days (around 04/21/2024) for Dr. Sirivol.  An After Visit Summary was printed and given to the patient.   LILLETTE Kato I Leal-Borjas,acting as a scribe for Abigail Free, MD.,have documented all relevant documentation on the behalf of  Abigail Free, MD,as directed by  Abigail Free, MD while in the presence of Abigail Free, MD.   Abigail Free, MD [facility] [phone]

## 2024-04-14 NOTE — Progress Notes (Signed)
 Subjective:  Patient ID: Kerry Matthews, male    DOB: April 21, 1936,  MRN: [id number]  Kerry Matthews presents to clinic today for:  Chief Complaint  Patient presents with   [facility]    [facility] with callous A1c was 6.3 in April ASA   Patient notes nails are thick, discolored, elongated and painful in shoegear when trying to ambulate.    PCP is Cox, Abigail, MD. last seen 12/08/2023.  States he is waiting to see if Dr. Stacy office has any cancellations so he can move his appointment to today instead of tomorrow.  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  ANCA antibodies- myeloperoxidase ABS-greater than 100, ANCA proteinase 3-less than 3.5 04/02/2019- ANCA titers- p-ANCA +1: 160, C ANCA-less than 1: 20, atypical p-ANCA titer-less than 1: 20   Abnormal CT of the chest    Abnormal findings on diagnostic imaging of lung 04/12/2019   04/01/2019-CT chest with contrast- no acute process in chest abdomen or pelvis, interstitial lung disease suspicious for early or mild UIP, pulmonary artery enlargement suggest PAH    Acquired trigger finger of left little finger 10/27/2022   Acute gallstone pancreatitis 09/12/2022   Adult BMI 27.0-27.9 kg/sq m 07/08/2022   Alpha-1-antitrypsin deficiency carrier 05/04/2019   Atherosclerotic heart disease of native coronary artery without angina pectoris 05/03/2018   Body mass index (BMI) 29.0-29.9, adult 01/05/2020   Choledocholithiasis 09/11/2022   Chronic kidney disease, stage 3a (HCC) 05/03/2018   Chronic respiratory failure with hypoxia (HCC) 03/06/2022   CKD (chronic kidney disease) 05/03/2018   Coronary artery disease involving native coronary artery of native heart without angina pectoris 05/03/2018   Diabetes mellitus type 2 in obese 04/01/2019   Diabetic polyneuropathy associated with type 2 diabetes mellitus (HCC) 08/12/2021   Dyslipidemia associated with type 2 diabetes mellitus (HCC) 05/03/2018   Dysuria  09/07/2020   Elevated liver enzymes 09/19/2022   Elevated rheumatoid factor 04/12/2019   04/03/2019-rheumatoid factor-95.4   GAD (generalized anxiety disorder)    GERD without esophagitis 05/03/2018   Gouty arthritis of left great toe 07/08/2022   History of kidney stones    Hypertension    Hypertensive heart disease without heart failure 05/03/2018   Hyponatremia 08/12/2021   Idiopathic pulmonary fibrosis (HCC) 03/29/2021   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   Interstitial pulmonary disease (HCC) 04/12/2019   04/01/2019-CT chest with contrast- no acute process in chest abdomen or pelvis, interstitial lung disease suspicious for early or mild UIP, pulmonary artery enlargement suggest PAH  04/02/2019-connective tissue work-up: Anti-Jo 1-negative Anti-DNA antibody double-stranded-negative Anti-scleroderma antibody-negative Sjogren's syndrome antibody-negative Sjogren's syndrome antibody-negative CK-31 CCP-6 E   Leukocytosis 03/08/2020   Low vitamin B12 level 04/03/2019   Medicare annual wellness visit, subsequent 08/09/2020   Microscopic polyangiitis (HCC) 03/29/2021   Mixed diabetic hyperlipidemia associated with type 2 diabetes mellitus (HCC) 08/12/2021   Mixed hyperlipidemia 05/03/2018   Osteoarthritis    Other emphysema (HCC)    Other long term (current) drug therapy 03/29/2021   Pain in finger of left hand 11/17/2022   Peripheral polyneuropathy 01/30/2020   Pneumonia of left lower lobe due to infectious organism 10/05/2022   Polymyositis (HCC)    Recurrent UTI    Respiratory disorder concurrent with and due to microscopic polyangiitis (HCC) 08/12/2021   Rheumatoid factor positive 03/29/2021   SIRS (systemic inflammatory response syndrome) (HCC) 09/11/2022   Skin cancer    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/17/2020  Swollen L ankle 09/19/2022   Transaminitis    Type 2 diabetes mellitus with hyperglycemia, with long-term current use of insulin  (HCC) 04/23/2021    UC (ulcerative colitis) (HCC)    Urinary frequency 09/19/2022   Vasculitis (HCC) 03/29/2021   Vitamin D  deficiency 10/21/2022   Past Surgical History:  Procedure Laterality Date   ANGIOPLASTY  2010   BACK SURGERY     CATARACT EXTRACTION     COLONOSCOPY  06/16/2005   Mild colitis involving splenic flexure. Colonic polyps, status post polypectomy. Mild pancolonic diverticulitits. Internal hemorrhoids.    ERCP N/A 09/13/2022   Procedure: ENDOSCOPIC RETROGRADE CHOLANGIOPANCREATOGRAPHY (ERCP);  Surgeon: Avram Lupita BRAVO, MD;  Location: [facility];  Service: Gastroenterology;  Laterality: N/A;   ESOPHAGOGASTRODUODENOSCOPY  04/26/2003   Irregular Z line suggestive of GERD. Mild gastritis status post CLO testing.    IR KYPHO EA ADDL LEVEL THORACIC OR LUMBAR  04/20/2023   IR KYPHO LUMBAR INC FX REDUCE BONE BX UNI/BIL CANNULATION INC/IMAGING  04/16/2023   IR KYPHO THORACIC WITH BONE BIOPSY  04/16/2023   IR RADIOLOGIST EVAL & MGMT  03/20/2023   REMOVAL OF STONES  09/13/2022   Procedure: REMOVAL OF STONES;  Surgeon: Avram Lupita BRAVO, MD;  Location: [facility];  Service: Gastroenterology;;   ANNETT  09/13/2022   Procedure: ANNETT;  Surgeon: Avram Lupita BRAVO, MD;  Location: [facility];  Service: Gastroenterology;;   TOTAL KNEE ARTHROPLASTY Left 12/17/2020   Procedure: LEFT TOTAL KNEE ARTHROPLASTY;  Surgeon: Jerri Kay HERO, MD;  Location: [facility];  Service: Orthopedics;  Laterality: Left;   TRIGGER FINGER RELEASE     Allergies  Allergen Reactions   Ace Inhibitors Other (See Comments)    = Slow heart rate   Hydrocodone  Nausea And Vomiting   Hydrocodone -Acetaminophen  Nausea And Vomiting   Nsaids Other (See Comments)    Kidney issues   Sulfamethoxazole Nausea And Vomiting   Sulfamethoxazole-Trimethoprim Nausea And Vomiting   Trimethoprim Other (See Comments)    Reaction not recalled    Review of Systems: Negative except as noted in the HPI.  Objective:  Kerry Matthews is a  pleasant 88 y.o. male in NAD. AAO x 3.  Vascular Examination: Capillary refill time is 3-5 seconds to toes bilateral. Palpable pedal pulses b/l LE. Digital hair present b/l.  Skin temperature gradient WNL b/l. No varicosities b/l. No cyanosis noted b/l.   Dermatological Examination: Pedal skin with normal turgor, texture and tone b/l. No open wounds. No interdigital macerations b/l. Toenails x10 are 3mm thick, discolored, dystrophic with subungual debris. There is pain with compression of the nail plates.  They are elongated x10     Latest Ref Rng & Units 12/08/2023    3:38 PM 09/08/2023    3:10 PM 06/05/2023    8:50 AM  Hemoglobin A1C  Hemoglobin-A1c 4.8 - 5.6 % 6.3  6.1  5.9    Assessment/Plan: 1. Pain due to onychomycosis of toenails of both feet     The mycotic toenails were sharply debrided x10 with sterile nail nippers and a power debriding burr to decrease bulk/thickness and length.    Some thick, hard skin was noted near the first MPJ bilateral which was sanded with an umbrella bur as a Research officer, political party.  Return in about 3 months (around 07/15/2024) for [facility].   Awanda CHARM Imperial, DPM, FACFAS [facility]     [address]  [place], [place] [address]                Office [phone]  Fax [phone]

## 2024-04-14 NOTE — Patient Instructions (Signed)
 Kerry Matthews , Thank you for taking time out of your busy schedule to complete your Annual Wellness Visit with me. I enjoyed our conversation and look forward to speaking with you again next year. I, as well as your care team,  appreciate your ongoing commitment to your health goals. Please review the following plan we discussed and let me know if I can assist you in the future. Your Game plan/ To Do List     Follow up Visits: We will see or speak with you next year for your Next Medicare AWV with our clinical staff Have you seen your provider in the last 6 months (3 months if uncontrolled diabetes)? Yes  Clinician Recommendations:  Aim for 30 minutes of exercise or brisk walking, 6-8 glasses of water , and 5 servings of fruits and vegetables each day.       This is a list of the screenings recommended for you:  Health Maintenance  Topic Date Due   DTaP/Tdap/Td vaccine (1 - Tdap) Never done   Zoster (Shingles) Vaccine (1 of 2) Never done   COVID-19 Vaccine (6 - Pfizer risk 2024-25 season) 12/04/2023   Eye exam for diabetics  12/16/2023   Flu Shot  03/25/2024   Hemoglobin A1C  06/08/2024   Complete foot exam   12/07/2024   Medicare Annual Wellness Visit  04/14/2025   Pneumococcal Vaccine for age over 81  Completed   HPV Vaccine  Aged Out   Meningitis B Vaccine  Aged Out    Advanced directives: (ACP Link)Information on Advanced Care Planning can be found at IT sales professional Health Care Directives Advance Health Care Directives. [email or web address]   Advance Care Planning is important because it:  [x]  Makes sure you receive the medical care that is consistent with your values, goals, and preferences  [x]  It provides guidance to your family and loved ones and reduces their decisional burden about whether or not they are making the right decisions based on your wishes.  Follow the link provided in your after visit summary or read over the paperwork we have mailed to you  to help you started getting your Advance Directives in place. If you need assistance in completing these, please reach out to us  so that we can help you!  See attachments for Preventive Care and Fall Prevention Tips.

## 2024-04-15 ENCOUNTER — Ambulatory Visit (INDEPENDENT_AMBULATORY_CARE_PROVIDER_SITE_OTHER): Admitting: Family Medicine

## 2024-04-15 ENCOUNTER — Encounter: Payer: Self-pay | Admitting: Family Medicine

## 2024-04-15 ENCOUNTER — Ambulatory Visit: Payer: Self-pay | Admitting: Family Medicine

## 2024-04-15 ENCOUNTER — Ambulatory Visit (INDEPENDENT_AMBULATORY_CARE_PROVIDER_SITE_OTHER)
Admission: RE | Admit: 2024-04-15 | Discharge: 2024-04-15 | Disposition: A | Discharge status: Home / Self Care | Source: Ambulatory Visit | Attending: Family Medicine | Admitting: Family Medicine

## 2024-04-15 ENCOUNTER — Other Ambulatory Visit: Payer: Self-pay | Admitting: Family Medicine

## 2024-04-15 VITALS — BP 122/72 | HR 68 | Temp 98.1°F | Ht 66.0 in | Wt 172.0 lb

## 2024-04-15 DIAGNOSIS — J208 Acute bronchitis due to other specified organisms: Secondary | ICD-10-CM

## 2024-04-15 DIAGNOSIS — R0602 Shortness of breath: Secondary | ICD-10-CM

## 2024-04-15 DIAGNOSIS — J849 Interstitial pulmonary disease, unspecified: Secondary | ICD-10-CM

## 2024-04-15 DIAGNOSIS — I771 Stricture of artery: Secondary | ICD-10-CM | POA: Diagnosis not present

## 2024-04-15 DIAGNOSIS — I517 Cardiomegaly: Secondary | ICD-10-CM | POA: Diagnosis not present

## 2024-04-15 MED ORDER — CEFTRIAXONE SODIUM 1 G IJ SOLR
1.0000 g | Freq: Once | INTRAMUSCULAR | Status: AC
  Administered 2024-04-15: 1 g via INTRAMUSCULAR

## 2024-04-15 MED ORDER — AMOXICILLIN-POT CLAVULANATE 875-125 MG PO TABS: 1.0000 | ORAL_TABLET | Freq: Two times a day (BID) | ORAL | 0 refills | Status: DC

## 2024-04-15 MED ORDER — TRIAMCINOLONE ACETONIDE 40 MG/ML IJ SUSP
80.0000 mg | Freq: Once | INTRAMUSCULAR | Status: AC
  Administered 2024-04-15: 80 mg via INTRAMUSCULAR

## 2024-04-15 MED ORDER — FUROSEMIDE 20 MG PO TABS
20.0000 mg | ORAL_TABLET | Freq: Every day | ORAL | 1 refills | Status: AC
Start: 2024-04-15 — End: ?

## 2024-04-17 DIAGNOSIS — J208 Acute bronchitis due to other specified organisms: Secondary | ICD-10-CM | POA: Insufficient documentation

## 2024-04-17 NOTE — Assessment & Plan Note (Signed)
 Ordering Chest xray.

## 2024-04-17 NOTE — Assessment & Plan Note (Signed)
 Sent Augmentin  875 mg BID. Order Chest xray. Kenalog  80 mg IM #1 and rocephin  1 G IM #1 was given at the office.

## 2024-04-21 ENCOUNTER — Ambulatory Visit (INDEPENDENT_AMBULATORY_CARE_PROVIDER_SITE_OTHER)

## 2024-04-21 VITALS — BP 124/80 | HR 80 | Temp 98.2°F | Ht 66.0 in | Wt 170.0 lb

## 2024-04-21 DIAGNOSIS — J84112 Idiopathic pulmonary fibrosis: Secondary | ICD-10-CM | POA: Diagnosis not present

## 2024-04-21 DIAGNOSIS — L570 Actinic keratosis: Secondary | ICD-10-CM

## 2024-04-21 NOTE — Patient Instructions (Addendum)
  VISIT SUMMARY: Today, we discussed your worsening cough and breathing difficulties related to your chronic lung disease. We reviewed your recent treatments and imaging results, and made adjustments to your medication plan.  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 Your recent chest x-ray showed no new pneumonia. -Stop taking Augmentin  as there is no pneumonia present. -Consider using cough syrup with codeine if your cough becomes severe. -Use oxygen  therapy if your oxygen  levels drop significantly. -Follow up with your lung specialist as needed.  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 Your previous medication was stopped due to side effects. -Continue Rituxan  infusions every six months.                      Contains text generated by Abridge.                                          Contains text generated by Abridge.

## 2024-04-21 NOTE — Assessment & Plan Note (Signed)
 Chronic interstitial lung disease with fibrosis and hypoxia.  Recent worsening of cough and sputum color change, but no new pneumonia on chest x-ray. Interstitial lung disease with fibrosis on imaging. Oxygen  saturation drops to 87% with activity, improving to low 90s at rest. He declined oxygen  therapy due to personal preference and concerns about quality of life. - Discontinue Augmentin  as no pneumonia is present and he has completed a 7 day course of the antibiotic.  - Consider cough syrup with codeine if cough becomes severe BUT HE DENIES ANY SEVERE COUGH AND DOES NOT WISH TO TAKE THE MEDICINE.  - Encourage use of oxygen  therapy if oxygen  saturation drops significantly, but he does not want to be on oxygen .  - Follow up with lung specialist as needed. While it is reassuring that his chest X ray and recent CT scan did not show any progressive worsening of his IPF, it is disappointing that he could not tolerate the medication targeting the fibrosis. He does understand that there is no scope for reversal of his interstitial lung disease.

## 2024-04-21 NOTE — Assessment & Plan Note (Signed)
 Multiple Aks noted on his body. He is hoping to have 3 of them treated today.  After obtaining verbal informed consent, 3 cycles of FREEZE-THAW CRYOTHERAPY are performed on each one of the 3 Aks noted on his LEFT CHEEK, DORSUM OF RIGHT WRIST AND RIGHT FOREARM.  Patient tolerated the procedure well.  He does understand that he may need repeat cryo if the lesions do not resolve.

## 2024-04-21 NOTE — Progress Notes (Signed)
 Subjective:  Patient ID: Kerry Matthews, male    DOB: [birth year]  Age: 88 y.o. MRN: [id number]  Chief Complaint  Patient presents with   Medical Management of Chronic Issues    HPI: Discussed the use of AI scribe software for clinical note transcription with the patient, who gave verbal consent to proceed.  Discussed the use of AI scribe software for clinical note transcription with the patient, who gave verbal consent to proceed.  History of Present Illness   Kerry Matthews is an 88 year old male with interstitial lung disease who presents with worsening cough and breathing difficulties. Accompanied by his wife.   Cough and sputum production - Worsening cough since approximately April 11, 2024 - Change in sputum color noted - Nocturnal cough not present; does not wake up due to coughing - Uses cough drops sparingly due to concerns about blood sugar  Dyspnea and oxygen  desaturation - Worsening breathing difficulties since April 11, 2024 - Dyspnea exacerbated by ambulation, especially when moving around his house (approximately eighty feet in length) - Oxygen  saturation drops to 87% with activity, returns to 91-93% at rest - Nocturnal dyspnea not reported - Previously used continuous oxygen  therapy but currently off oxygen  by preference  Interstitial lung disease and pulmonary fibrosis - Chronic interstitial lung disease with fibrosis causing daily breathing difficulties - Last pulmonology visit on October 09, 2023 - Chest x-ray on April 15, 2024 showed no new pneumonia and was unchanged from December 2024 and January 2025 imaging - CT scan in December 2024 showed stable lung condition - Discontinued prior medication for lung disease due to side effects (loss of appetite, depression)  Immunosuppressive therapy and polymyositis - Receives Rituxan  infusions every six months for polymyositis, which may help with lung inflammation  Vasculitis - History of vasculitis  managed by rheumatology  Infectious concerns and recent treatment - Received Rocephin  injection and 10-day course of Augmentin  on April 15, 2024 for suspected infection - Cautious about COVID-19 exposure; attends church services from his car            04/21/2024    8:39 AM 04/14/2024    9:01 AM 06/05/2023    8:19 AM 04/09/2023   10:40 AM 01/30/2023    8:25 AM  Depression screen PHQ 2/9  Decreased Interest 0 0 0 0 0  Down, Depressed, Hopeless 0 0 0 0 0  PHQ - 2 Score 0 0 0 0 0  Altered sleeping     1  Tired, decreased energy     1  Change in appetite     1  Feeling bad or failure about yourself      0  Trouble concentrating     0  Moving slowly or fidgety/restless     0  Suicidal thoughts     0  PHQ-9 Score     3  Difficult doing work/chores     Somewhat difficult        04/21/2024    8:39 AM  Fall Risk   Falls in the past year? 0  Number falls in past yr: 0  Injury with Fall? 0  Risk for fall due to : No Fall Risks  Follow up Falls evaluation completed    Patient Care Team: Sherre Clapper, MD as PCP - General (Internal Medicine) Mannam, Praveen, MD as Consulting Physician (Pulmonary Disease) Revankar, Jennifer SAUNDERS, MD as Consulting Physician (Cardiology) Nyle Rankin POUR, [facility] (Inactive) (Pharmacist) Ishmael Slough, MD as Consulting Physician (Rheumatology) Erasmo Motto  E, OD (Optometry) McCaughan, Dia D, DPM as Consulting Physician (Podiatry)   Review of Systems  Constitutional:  Negative for chills, fatigue and fever.  HENT:  Negative for congestion, ear pain, sinus pressure and sore throat.   Respiratory:  Positive for cough and shortness of breath.   Cardiovascular:  Negative for chest pain.  Gastrointestinal:  Negative for abdominal pain, constipation, diarrhea, nausea and vomiting.  Genitourinary:  Negative for dysuria and frequency.  Musculoskeletal:  Negative for arthralgias, back pain and myalgias.  Neurological:  Negative for dizziness and headaches.   Psychiatric/Behavioral:  Negative for dysphoric mood. The patient is not nervous/anxious.     Current Outpatient Medications on File Prior to Visit  Medication Sig Dispense Refill   Alcohol  Swabs  (DROPSAFE ALCOHOL  PREP) 70 % PADS Use new pad when checking sugar 400 each 3   amoxicillin -clavulanate (AUGMENTIN ) 875-125 MG tablet Take 1 tablet by mouth 2 (two) times daily. 20 tablet 0   aspirin  EC 81 MG tablet Take 1 tablet (81 mg total) by mouth daily. Swallow whole. 30 tablet 11   Blood Glucose Monitoring Suppl DEVI 1 each by Does not apply route in the morning, at noon, and at bedtime. May substitute to any manufacturer covered by patient's insurance. 1 each 0   cyanocobalamin  (VITAMIN B12) 1000 MCG tablet Take 1 tablet (1,000 mcg total) by mouth daily. 90 tablet 1   Evolocumab  (REPATHA ) 140 MG/ML SOSY Inject 140 mg into the skin every 14 (fourteen) days. 6 mL 1   furosemide  (LASIX ) 20 MG tablet Take 1 tablet (20 mg total) by mouth daily. 90 tablet 1   gabapentin  (NEURONTIN ) 400 MG capsule Take 400 mg by mouth 2 (two) times daily.     Glucose Blood (BLOOD GLUCOSE TEST STRIPS) STRP 1 each by In Vitro route in the morning, at noon, and at bedtime. May substitute to any manufacturer covered by patient's insurance. 100 strip 3   Multiple Vitamins-Minerals (PRESERVISION AREDS 2) CAPS Take 1 capsule by mouth 2 (two) times daily after a meal.     nitroGLYCERIN  (NITROSTAT ) 0.4 MG SL tablet Place 1 tablet (0.4 mg total) under the tongue every 5 (five) minutes as needed for chest pain. 25 tablet 9   omeprazole  (PRILOSEC) 20 MG capsule Take 1 capsule (20 mg total) by mouth daily. 90 capsule 3   polyethylene glycol (MIRALAX  / GLYCOLAX ) 17 g packet Take 17 g by mouth daily.     riTUXimab  (RITUXAN ) 500 MG/50ML injection Inject 1,000 mg into the vein every 6 (six) months. 2 does 2 weeks apart     No current facility-administered medications on file prior to visit.   Past Medical History:  Diagnosis Date    Abnormal ANCA test 04/12/2019   04/02/2019- MPO/PR-3  ANCA antibodies- myeloperoxidase ABS-greater than 100, ANCA proteinase 3-less than 3.5 04/02/2019- ANCA titers- p-ANCA +1: 160, C ANCA-less than 1: 20, atypical p-ANCA titer-less than 1: 20   Abnormal CT of the chest    Abnormal findings on diagnostic imaging of lung 04/12/2019   04/01/2019-CT chest with contrast- no acute process in chest abdomen or pelvis, interstitial lung disease suspicious for early or mild UIP, pulmonary artery enlargement suggest PAH    Acquired trigger finger of left little finger 10/27/2022   Acute gallstone pancreatitis 09/12/2022   Adult BMI 27.0-27.9 kg/sq m 07/08/2022   Alpha-1-antitrypsin deficiency carrier 05/04/2019   Atherosclerotic heart disease of native coronary artery without angina pectoris 05/03/2018   Body mass index (BMI) 29.0-29.9, adult 01/05/2020  Choledocholithiasis 09/11/2022   Chronic kidney disease, stage 3a (HCC) 05/03/2018   Chronic respiratory failure with hypoxia (HCC) 03/06/2022   CKD (chronic kidney disease) 05/03/2018   Coronary artery disease involving native coronary artery of native heart without angina pectoris 05/03/2018   Diabetes mellitus type 2 in obese 04/01/2019   Diabetic polyneuropathy associated with type 2 diabetes mellitus (HCC) 08/12/2021   Dyslipidemia associated with type 2 diabetes mellitus (HCC) 05/03/2018   Dysuria 09/07/2020   Elevated liver enzymes 09/19/2022   Elevated rheumatoid factor 04/12/2019   04/03/2019-rheumatoid factor-95.4   GAD (generalized anxiety disorder)    GERD without esophagitis 05/03/2018   Gouty arthritis of left great toe 07/08/2022   History of kidney stones    Hypertension    Hypertensive heart disease without heart failure 05/03/2018   Hyponatremia 08/12/2021   Idiopathic pulmonary fibrosis (HCC) 03/29/2021   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   Interstitial pulmonary disease (HCC) 04/12/2019   04/01/2019-CT chest with  contrast- no acute process in chest abdomen or pelvis, interstitial lung disease suspicious for early or mild UIP, pulmonary artery enlargement suggest PAH  04/02/2019-connective tissue work-up: Anti-Jo 1-negative Anti-DNA antibody double-stranded-negative Anti-scleroderma antibody-negative Sjogren's syndrome antibody-negative Sjogren's syndrome antibody-negative CK-31 CCP-6 E   Leukocytosis 03/08/2020   Low vitamin B12 level 04/03/2019   Medicare annual wellness visit, subsequent 08/09/2020   Microscopic polyangiitis (HCC) 03/29/2021   Mixed diabetic hyperlipidemia associated with type 2 diabetes mellitus (HCC) 08/12/2021   Mixed hyperlipidemia 05/03/2018   Osteoarthritis    Other emphysema (HCC)    Other long term (current) drug therapy 03/29/2021   Pain in finger of left hand 11/17/2022   Peripheral polyneuropathy 01/30/2020   Pneumonia of left lower lobe due to infectious organism 10/05/2022   Polymyositis (HCC)    Recurrent UTI    Respiratory disorder concurrent with and due to microscopic polyangiitis (HCC) 08/12/2021   Rheumatoid factor positive 03/29/2021   SIRS (systemic inflammatory response syndrome) (HCC) 09/11/2022   Skin cancer    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/17/2020   Swollen L ankle 09/19/2022   Transaminitis    Type 2 diabetes mellitus with hyperglycemia, with long-term current use of insulin  (HCC) 04/23/2021   UC (ulcerative colitis) (HCC)    Urinary frequency 09/19/2022   Vasculitis (HCC) 03/29/2021   Vitamin D  deficiency 10/21/2022   Past Surgical History:  Procedure Laterality Date   ANGIOPLASTY  2010   BACK SURGERY     CATARACT EXTRACTION     COLONOSCOPY  06/16/2005   Mild colitis involving splenic flexure. Colonic polyps, status post polypectomy. Mild pancolonic diverticulitits. Internal hemorrhoids.    ERCP N/A 09/13/2022   Procedure: ENDOSCOPIC RETROGRADE CHOLANGIOPANCREATOGRAPHY (ERCP);  Surgeon: Avram Lupita BRAVO, MD;  Location: [facility];   Service: Gastroenterology;  Laterality: N/A;   ESOPHAGOGASTRODUODENOSCOPY  04/26/2003   Irregular Z line suggestive of GERD. Mild gastritis status post CLO testing.    IR KYPHO EA ADDL LEVEL THORACIC OR LUMBAR  04/20/2023   IR KYPHO LUMBAR INC FX REDUCE BONE BX UNI/BIL CANNULATION INC/IMAGING  04/16/2023   IR KYPHO THORACIC WITH BONE BIOPSY  04/16/2023   IR RADIOLOGIST EVAL & MGMT  03/20/2023   REMOVAL OF STONES  09/13/2022   Procedure: REMOVAL OF STONES;  Surgeon: Avram Lupita BRAVO, MD;  Location: [facility];  Service: Gastroenterology;;   ANNETT  09/13/2022   Procedure: ANNETT;  Surgeon: Avram Lupita BRAVO, MD;  Location: [facility];  Service: Gastroenterology;;   TOTAL KNEE ARTHROPLASTY Left 12/17/2020  Procedure: LEFT TOTAL KNEE ARTHROPLASTY;  Surgeon: Jerri Kay HERO, MD;  Location: [facility];  Service: Orthopedics;  Laterality: Left;   TRIGGER FINGER RELEASE      Family History  Problem Relation Age of Onset   Tuberculosis Mother    Stroke Father    Pancreatic cancer Sister    Heart attack Sister    Lung disease Sister    Clotting disorder Brother    Colon cancer Neg Hx    Esophageal cancer Neg Hx    Social History   Socioeconomic History   Marital status: Married    Spouse name: Not on file   Number of children: 2   Years of education: Not on file   Highest education level: 8th grade  Occupational History   Occupation: retired  Tobacco Use   Smoking status: Never   Smokeless tobacco: Never  Vaping Use   Vaping status: Never Used  Substance and Sexual Activity   Alcohol  use: Never   Drug use: Never   Sexual activity: Not on file  Other Topics Concern   Not on file  Social History Narrative   Not on file   Social Drivers of Health   Financial Resource Strain: Low Risk  (04/13/2024)   Overall Financial Resource Strain (CARDIA)    Difficulty of Paying Living Expenses: Not hard at all  Food Insecurity: No Food Insecurity (04/13/2024)   Hunger Vital Sign     Worried About Running Out of Food in the Last Year: Never true    Ran Out of Food in the Last Year: Never true  Transportation Needs: No Transportation Needs (04/13/2024)   PRAPARE - Administrator, Civil Service (Medical): No    Lack of Transportation (Non-Medical): No  Physical Activity: Inactive (04/13/2024)   Exercise Vital Sign    Days of Exercise per Week: 0 days    Minutes of Exercise per Session: 0 min  Stress: No Stress Concern Present (04/13/2024)   [employer] of Occupational Health - Occupational Stress Questionnaire    Feeling of Stress: Not at all  Social Connections: Socially Integrated (04/13/2024)   Social Connection and Isolation Panel    Frequency of Communication with Friends and Family: More than three times a week    Frequency of Social Gatherings with Friends and Family: Once a week    Attends Religious Services: More than 4 times per year    Active Member of [employer] or Organizations: Yes    Attends Engineer, structural: More than 4 times per year    Marital Status: Married    Objective:  BP 124/80   Pulse 80   Temp 98.2 F (36.8 C)   Ht 5' 6 (1.676 m)   Wt 170 lb (77.1 kg)   SpO2 95%   BMI 27.44 kg/m      04/21/2024    8:36 AM 04/15/2024    9:36 AM 04/14/2024    8:56 AM  BP/Weight  Systolic BP 124 122 --  Diastolic BP 80 72 --  Wt. (Lbs) 170 172 171  BMI 27.44 kg/m2 27.76 kg/m2 27.6 kg/m2    Physical Exam HENT:     Head: Normocephalic and atraumatic.  Cardiovascular:     Rate and Rhythm: Normal rate and regular rhythm.  Pulmonary:     Breath sounds: Rales present.     Comments: Extensive bilateral crackles noted in both lungs UNCHANGED FROM BEFORE Skin:    General: Skin is warm.  Findings: Lesion (actinic keratoses noted on LEFT CHEEK, RIGHT FOREARM, RIGHT WRIST) present.  Neurological:     General: No focal deficit present.     Mental Status: He is alert.  Psychiatric:        Mood and Affect: Mood normal.          Lab Results  Component Value Date   WBC 10.1 12/08/2023   HGB 15.3 12/08/2023   HCT 46.4 12/08/2023   PLT 289 12/08/2023   GLUCOSE 117 (H) 12/08/2023   CHOL 154 12/08/2023   TRIG 195 (H) 12/08/2023   HDL 39 (L) 12/08/2023   LDLCALC 82 12/08/2023   ALT 13 12/08/2023   AST 19 12/08/2023   NA 140 12/08/2023   K 5.4 (H) 12/08/2023   CL 103 12/08/2023   CREATININE 1.39 (H) 12/08/2023   BUN 15 12/08/2023   CO2 24 12/08/2023   TSH 2.028 09/11/2022   INR 1.2 04/16/2023   HGBA1C 6.3 (H) 12/08/2023      Assessment & Plan:  Multiple actinic keratoses Assessment & Plan: Multiple Aks noted on his body. He is hoping to have 3 of them treated today.  After obtaining verbal informed consent, 3 cycles of FREEZE-THAW CRYOTHERAPY are performed on each one of the 3 Aks noted on his LEFT CHEEK, DORSUM OF RIGHT WRIST AND RIGHT FOREARM.  Patient tolerated the procedure well.  He does understand that he may need repeat cryo if the lesions do not resolve.    Idiopathic pulmonary fibrosis (HCC) Assessment & Plan: Chronic interstitial lung disease with fibrosis and hypoxia.  Recent worsening of cough and sputum color change, but no new pneumonia on chest x-ray. Interstitial lung disease with fibrosis on imaging. Oxygen  saturation drops to 87% with activity, improving to low 90s at rest. He declined oxygen  therapy due to personal preference and concerns about quality of life. - Discontinue Augmentin  as no pneumonia is present and he has completed a 7 day course of the antibiotic.  - Consider cough syrup with codeine if cough becomes severe BUT HE DENIES ANY SEVERE COUGH AND DOES NOT WISH TO TAKE THE MEDICINE.  - Encourage use of oxygen  therapy if oxygen  saturation drops significantly, but he does not want to be on oxygen .  - Follow up with lung specialist as needed. While it is reassuring that his chest X ray and recent CT scan did not show any progressive worsening of his IPF, it is  disappointing that he could not tolerate the medication targeting the fibrosis. He does understand that there is no scope for reversal of his interstitial lung disease.       Assessment and Plan   Assessment and Plan             No orders of the defined types were placed in this encounter.   No orders of the defined types were placed in this encounter.    Follow-up: Return if symptoms worsen or fail to improve.  An After Visit Summary was printed and given to the patient.  Adrienne Delay, MD [facility] [phone]

## 2024-05-26 ENCOUNTER — Other Ambulatory Visit: Payer: Self-pay | Admitting: Family Medicine

## 2024-05-26 DIAGNOSIS — E782 Mixed hyperlipidemia: Secondary | ICD-10-CM

## 2024-05-27 ENCOUNTER — Other Ambulatory Visit: Payer: Self-pay

## 2024-05-27 DIAGNOSIS — E782 Mixed hyperlipidemia: Secondary | ICD-10-CM

## 2024-05-27 MED ORDER — REPATHA SURECLICK 140 MG/ML ~~LOC~~ SOAJ: SUBCUTANEOUS | Status: AC

## 2024-05-30 ENCOUNTER — Other Ambulatory Visit: Payer: Self-pay | Admitting: Family Medicine

## 2024-05-30 DIAGNOSIS — E782 Mixed hyperlipidemia: Secondary | ICD-10-CM

## 2024-06-07 ENCOUNTER — Other Ambulatory Visit: Payer: Self-pay | Admitting: Family Medicine

## 2024-06-09 DIAGNOSIS — Z961 Presence of intraocular lens: Secondary | ICD-10-CM | POA: Diagnosis not present

## 2024-06-09 DIAGNOSIS — Z794 Long term (current) use of insulin: Secondary | ICD-10-CM | POA: Diagnosis not present

## 2024-06-09 DIAGNOSIS — H26492 Other secondary cataract, left eye: Secondary | ICD-10-CM | POA: Diagnosis not present

## 2024-06-09 DIAGNOSIS — H1089 Other conjunctivitis: Secondary | ICD-10-CM | POA: Diagnosis not present

## 2024-06-09 DIAGNOSIS — E119 Type 2 diabetes mellitus without complications: Secondary | ICD-10-CM | POA: Diagnosis not present

## 2024-06-21 DIAGNOSIS — L820 Inflamed seborrheic keratosis: Secondary | ICD-10-CM | POA: Diagnosis not present

## 2024-06-21 DIAGNOSIS — L578 Other skin changes due to chronic exposure to nonionizing radiation: Secondary | ICD-10-CM | POA: Diagnosis not present

## 2024-06-21 DIAGNOSIS — L570 Actinic keratosis: Secondary | ICD-10-CM | POA: Diagnosis not present

## 2024-06-21 DIAGNOSIS — L821 Other seborrheic keratosis: Secondary | ICD-10-CM | POA: Diagnosis not present

## 2024-07-14 ENCOUNTER — Ambulatory Visit: Admitting: Podiatry

## 2024-07-14 DIAGNOSIS — B351 Tinea unguium: Secondary | ICD-10-CM

## 2024-07-14 DIAGNOSIS — M79675 Pain in left toe(s): Secondary | ICD-10-CM | POA: Diagnosis not present

## 2024-07-14 DIAGNOSIS — M79674 Pain in right toe(s): Secondary | ICD-10-CM

## 2024-07-14 NOTE — Progress Notes (Signed)
 Subjective:  Patient ID: Kerry Matthews, male    DOB: 1935-09-15,  MRN: [id number]  [name] presents to clinic today for:  Chief Complaint  Patient presents with   [facility]    Last A1c: 6.3. ASA 81 mg. Nail care. Denies corn/callous.    Patient notes nails are thick, discolored, elongated and painful in shoegear when trying to ambulate.    PCP is Cox, Abigail, MD. 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  ANCA antibodies- myeloperoxidase ABS-greater than 100, ANCA proteinase 3-less than 3.5 04/02/2019- ANCA titers- p-ANCA +1: 160, C ANCA-less than 1: 20, atypical p-ANCA titer-less than 1: 20   Abnormal CT of the chest    Abnormal findings on diagnostic imaging of lung 04/12/2019   04/01/2019-CT chest with contrast- no acute process in chest abdomen or pelvis, interstitial lung disease suspicious for early or mild UIP, pulmonary artery enlargement suggest PAH    Acquired trigger finger of left little finger 10/27/2022   Acute gallstone pancreatitis 09/12/2022   Adult BMI 27.0-27.9 kg/sq m 07/08/2022   Alpha-1-antitrypsin deficiency carrier 05/04/2019   Atherosclerotic heart disease of native coronary artery without angina pectoris 05/03/2018   Body mass index (BMI) 29.0-29.9, adult 01/05/2020   Choledocholithiasis 09/11/2022   Chronic kidney disease, stage 3a (HCC) 05/03/2018   Chronic respiratory failure with hypoxia (HCC) 03/06/2022   CKD (chronic kidney disease) 05/03/2018   Coronary artery disease involving native coronary artery of native heart without angina pectoris 05/03/2018   Diabetes mellitus type 2 in obese 04/01/2019   Diabetic polyneuropathy associated with type 2 diabetes mellitus (HCC) 08/12/2021   Dyslipidemia associated with type 2 diabetes mellitus (HCC) 05/03/2018   Dysuria 09/07/2020   Elevated liver enzymes 09/19/2022   Elevated rheumatoid factor 04/12/2019   04/03/2019-rheumatoid  factor-95.4   GAD (generalized anxiety disorder)    GERD without esophagitis 05/03/2018   Gouty arthritis of left great toe 07/08/2022   History of kidney stones    Hypertension    Hypertensive heart disease without heart failure 05/03/2018   Hyponatremia 08/12/2021   Idiopathic pulmonary fibrosis (HCC) 03/29/2021   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   Interstitial pulmonary disease (HCC) 04/12/2019   04/01/2019-CT chest with contrast- no acute process in chest abdomen or pelvis, interstitial lung disease suspicious for early or mild UIP, pulmonary artery enlargement suggest PAH  04/02/2019-connective tissue work-up: Anti-Jo 1-negative Anti-DNA antibody double-stranded-negative Anti-scleroderma antibody-negative Sjogren's syndrome antibody-negative Sjogren's syndrome antibody-negative CK-31 CCP-6 E   Leukocytosis 03/08/2020   Low vitamin B12 level 04/03/2019   Medicare annual wellness visit, subsequent 08/09/2020   Microscopic polyangiitis (HCC) 03/29/2021   Mixed diabetic hyperlipidemia associated with type 2 diabetes mellitus (HCC) 08/12/2021   Mixed hyperlipidemia 05/03/2018   Osteoarthritis    Other emphysema (HCC)    Other long term (current) drug therapy 03/29/2021   Pain in finger of left hand 11/17/2022   Peripheral polyneuropathy 01/30/2020   Pneumonia of left lower lobe due to infectious organism 10/05/2022   Polymyositis (HCC)    Recurrent UTI    Respiratory disorder concurrent with and due to microscopic polyangiitis (HCC) 08/12/2021   Rheumatoid factor positive 03/29/2021   SIRS (systemic inflammatory response syndrome) (HCC) 09/11/2022   Skin cancer    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/17/2020   Swollen L ankle 09/19/2022   Transaminitis    Type 2 diabetes mellitus with hyperglycemia, with long-term current use of insulin  (HCC)  04/23/2021   UC (ulcerative colitis) (HCC)    Urinary frequency 09/19/2022   Vasculitis 03/29/2021   Vitamin D  deficiency  10/21/2022   Past Surgical History:  Procedure Laterality Date   ANGIOPLASTY  2010   BACK SURGERY     CATARACT EXTRACTION     COLONOSCOPY  06/16/2005   Mild colitis involving splenic flexure. Colonic polyps, status post polypectomy. Mild pancolonic diverticulitits. Internal hemorrhoids.    ERCP N/A 09/13/2022   Procedure: ENDOSCOPIC RETROGRADE CHOLANGIOPANCREATOGRAPHY (ERCP);  Surgeon: Avram Lupita BRAVO, MD;  Location: [facility];  Service: Gastroenterology;  Laterality: N/A;   ESOPHAGOGASTRODUODENOSCOPY  04/26/2003   Irregular Z line suggestive of GERD. Mild gastritis status post CLO testing.    IR KYPHO EA ADDL LEVEL THORACIC OR LUMBAR  04/20/2023   IR KYPHO LUMBAR INC FX REDUCE BONE BX UNI/BIL CANNULATION INC/IMAGING  04/16/2023   IR KYPHO THORACIC WITH BONE BIOPSY  04/16/2023   IR RADIOLOGIST EVAL & MGMT  03/20/2023   REMOVAL OF STONES  09/13/2022   Procedure: REMOVAL OF STONES;  Surgeon: Avram Lupita BRAVO, MD;  Location: [facility];  Service: Gastroenterology;;   ANNETT  09/13/2022   Procedure: ANNETT;  Surgeon: Avram Lupita BRAVO, MD;  Location: [facility];  Service: Gastroenterology;;   TOTAL KNEE ARTHROPLASTY Left 12/17/2020   Procedure: LEFT TOTAL KNEE ARTHROPLASTY;  Surgeon: Jerri Kay HERO, MD;  Location: [facility];  Service: Orthopedics;  Laterality: Left;   TRIGGER FINGER RELEASE     Allergies  Allergen Reactions   Ace Inhibitors Other (See Comments)    = Slow heart rate   Hydrocodone  Nausea And Vomiting   Hydrocodone -Acetaminophen  Nausea And Vomiting   Nsaids Other (See Comments)    Kidney issues   Sulfamethoxazole Nausea And Vomiting   Sulfamethoxazole-Trimethoprim Nausea And Vomiting   Trimethoprim Other (See Comments)    Reaction not recalled    Review of Systems: Negative except as noted in the HPI.  Objective:  Kerry Matthews is a pleasant 88 y.o. male in NAD. AAO x 3.  Vascular Examination: Capillary refill time is 3-5 seconds to toes bilateral.  Palpable pedal pulses b/l LE. Digital hair present b/l.  Skin temperature gradient WNL b/l. No varicosities b/l. No cyanosis noted b/l.   Dermatological Examination: Pedal skin with normal turgor, texture and tone b/l. No open wounds. No interdigital macerations b/l. Toenails x10 are 3mm thick, discolored, dystrophic with subungual debris. There is pain with compression of the nail plates.  They are elongated x10     Latest Ref Rng & Units 12/08/2023    3:38 PM 09/08/2023    3:10 PM  Hemoglobin A1C  Hemoglobin-A1c 4.8 - 5.6 % 6.3  6.1    Assessment/Plan: 1. Pain due to onychomycosis of toenails of both feet     The mycotic toenails were sharply debrided x10 with sterile nail nippers and a power debriding burr to decrease bulk/thickness and length.    Return in about 3 months (around 10/14/2024) for [facility] [address].   Awanda CHARM Imperial, DPM, FACFAS [facility]     [address] [place], [place] [address]                Office [phone]  Fax [phone]

## 2024-07-19 DIAGNOSIS — M317 Microscopic polyangiitis: Secondary | ICD-10-CM | POA: Diagnosis not present

## 2024-08-02 DIAGNOSIS — M317 Microscopic polyangiitis: Secondary | ICD-10-CM | POA: Diagnosis not present

## 2024-10-13 ENCOUNTER — Ambulatory Visit: Admitting: Podiatry

## 2025-04-20 ENCOUNTER — Ambulatory Visit
